# Patient Record
Sex: Female | Born: 1950 | Race: White | Hispanic: No | Smoking: Never smoker
Health system: Southern US, Community
[De-identification: ages and names within clinical notes are randomized; demographics above are authoritative.]

## PROBLEM LIST (undated history)

## (undated) DIAGNOSIS — E785 Hyperlipidemia, unspecified: Secondary | ICD-10-CM

## (undated) DIAGNOSIS — T7840XA Allergy, unspecified, initial encounter: Secondary | ICD-10-CM

## (undated) DIAGNOSIS — I219 Acute myocardial infarction, unspecified: Secondary | ICD-10-CM

## (undated) DIAGNOSIS — F32A Depression, unspecified: Secondary | ICD-10-CM

## (undated) DIAGNOSIS — K644 Residual hemorrhoidal skin tags: Secondary | ICD-10-CM

## (undated) DIAGNOSIS — G4730 Sleep apnea, unspecified: Secondary | ICD-10-CM

## (undated) DIAGNOSIS — K635 Polyp of colon: Secondary | ICD-10-CM

## (undated) DIAGNOSIS — I5041 Acute combined systolic (congestive) and diastolic (congestive) heart failure: Secondary | ICD-10-CM

## (undated) DIAGNOSIS — J45909 Unspecified asthma, uncomplicated: Secondary | ICD-10-CM

## (undated) DIAGNOSIS — G4710 Hypersomnia, unspecified: Secondary | ICD-10-CM

## (undated) DIAGNOSIS — K760 Fatty (change of) liver, not elsewhere classified: Secondary | ICD-10-CM

## (undated) DIAGNOSIS — I639 Cerebral infarction, unspecified: Secondary | ICD-10-CM

## (undated) DIAGNOSIS — K3184 Gastroparesis: Secondary | ICD-10-CM

## (undated) DIAGNOSIS — M810 Age-related osteoporosis without current pathological fracture: Secondary | ICD-10-CM

## (undated) DIAGNOSIS — E119 Type 2 diabetes mellitus without complications: Secondary | ICD-10-CM

## (undated) DIAGNOSIS — I10 Essential (primary) hypertension: Secondary | ICD-10-CM

## (undated) DIAGNOSIS — F329 Major depressive disorder, single episode, unspecified: Secondary | ICD-10-CM

## (undated) DIAGNOSIS — I2119 ST elevation (STEMI) myocardial infarction involving other coronary artery of inferior wall: Secondary | ICD-10-CM

## (undated) DIAGNOSIS — I2510 Atherosclerotic heart disease of native coronary artery without angina pectoris: Secondary | ICD-10-CM

## (undated) DIAGNOSIS — K5790 Diverticulosis of intestine, part unspecified, without perforation or abscess without bleeding: Secondary | ICD-10-CM

## (undated) DIAGNOSIS — E039 Hypothyroidism, unspecified: Secondary | ICD-10-CM

## (undated) DIAGNOSIS — E669 Obesity, unspecified: Secondary | ICD-10-CM

## (undated) DIAGNOSIS — H269 Unspecified cataract: Secondary | ICD-10-CM

## (undated) DIAGNOSIS — N200 Calculus of kidney: Secondary | ICD-10-CM

## (undated) HISTORY — PX: CERVICAL DISC SURGERY: SHX588

## (undated) HISTORY — DX: Unspecified cataract: H26.9

## (undated) HISTORY — DX: Age-related osteoporosis without current pathological fracture: M81.0

## (undated) HISTORY — DX: Unspecified asthma, uncomplicated: J45.909

## (undated) HISTORY — DX: Hyperlipidemia, unspecified: E78.5

## (undated) HISTORY — DX: Allergy, unspecified, initial encounter: T78.40XA

## (undated) HISTORY — DX: Obesity, unspecified: E66.9

## (undated) HISTORY — DX: Hypersomnia, unspecified: G47.10

## (undated) HISTORY — DX: Residual hemorrhoidal skin tags: K64.4

## (undated) HISTORY — DX: Fatty (change of) liver, not elsewhere classified: K76.0

## (undated) HISTORY — DX: Hypothyroidism, unspecified: E03.9

## (undated) HISTORY — DX: Gastroparesis: K31.84

## (undated) HISTORY — DX: Diverticulosis of intestine, part unspecified, without perforation or abscess without bleeding: K57.90

## (undated) HISTORY — DX: Type 2 diabetes mellitus without complications: E11.9

## (undated) HISTORY — DX: Major depressive disorder, single episode, unspecified: F32.9

## (undated) HISTORY — DX: Essential (primary) hypertension: I10

## (undated) HISTORY — DX: Acute myocardial infarction, unspecified: I21.9

## (undated) HISTORY — PX: COLONOSCOPY: SHX174

## (undated) HISTORY — DX: Polyp of colon: K63.5

## (undated) HISTORY — DX: Cerebral infarction, unspecified: I63.9

## (undated) HISTORY — DX: Sleep apnea, unspecified: G47.30

## (undated) HISTORY — PX: ROTATOR CUFF REPAIR: SHX139

## (undated) HISTORY — PX: ULNAR NERVE REPAIR: SHX2594

## (undated) HISTORY — PX: BREAST REDUCTION SURGERY: SHX8

## (undated) HISTORY — DX: Calculus of kidney: N20.0

## (undated) HISTORY — PX: TUBAL LIGATION: SHX77

## (undated) HISTORY — DX: Depression, unspecified: F32.A

---

## 1998-09-08 HISTORY — PX: CARPAL TUNNEL RELEASE: SHX101

## 2004-09-25 ENCOUNTER — Ambulatory Visit (HOSPITAL_COMMUNITY): Admission: RE | Admit: 2004-09-25 | Discharge: 2004-09-25 | Payer: Self-pay | Admitting: Gastroenterology

## 2004-10-02 ENCOUNTER — Encounter: Admission: RE | Admit: 2004-10-02 | Discharge: 2004-10-02 | Payer: Self-pay | Admitting: Gastroenterology

## 2004-10-25 ENCOUNTER — Ambulatory Visit (HOSPITAL_COMMUNITY): Admission: RE | Admit: 2004-10-25 | Discharge: 2004-10-25 | Payer: Self-pay | Admitting: Family Medicine

## 2005-02-22 ENCOUNTER — Encounter: Admission: RE | Admit: 2005-02-22 | Discharge: 2005-02-22 | Payer: Self-pay | Admitting: Neurology

## 2005-11-26 ENCOUNTER — Encounter: Admission: RE | Admit: 2005-11-26 | Discharge: 2005-11-26 | Payer: Self-pay | Admitting: Internal Medicine

## 2006-03-04 ENCOUNTER — Ambulatory Visit: Payer: Self-pay | Admitting: Internal Medicine

## 2006-03-08 DIAGNOSIS — K635 Polyp of colon: Secondary | ICD-10-CM

## 2006-03-08 HISTORY — DX: Polyp of colon: K63.5

## 2006-03-23 ENCOUNTER — Ambulatory Visit: Payer: Self-pay | Admitting: Internal Medicine

## 2008-05-09 ENCOUNTER — Encounter: Admission: RE | Admit: 2008-05-09 | Discharge: 2008-05-09 | Payer: Self-pay | Admitting: Internal Medicine

## 2008-12-06 ENCOUNTER — Encounter: Admission: RE | Admit: 2008-12-06 | Discharge: 2008-12-06 | Payer: Self-pay | Admitting: Internal Medicine

## 2008-12-25 ENCOUNTER — Ambulatory Visit (HOSPITAL_COMMUNITY): Admission: RE | Admit: 2008-12-25 | Discharge: 2008-12-25 | Payer: Self-pay | Admitting: *Deleted

## 2009-10-08 ENCOUNTER — Telehealth: Payer: Self-pay | Admitting: Internal Medicine

## 2009-10-12 ENCOUNTER — Ambulatory Visit: Payer: Self-pay | Admitting: Internal Medicine

## 2009-10-12 ENCOUNTER — Encounter (INDEPENDENT_AMBULATORY_CARE_PROVIDER_SITE_OTHER): Payer: Self-pay | Admitting: *Deleted

## 2009-10-16 ENCOUNTER — Telehealth: Payer: Self-pay | Admitting: Internal Medicine

## 2009-11-29 ENCOUNTER — Ambulatory Visit: Payer: Self-pay | Admitting: Internal Medicine

## 2009-11-29 ENCOUNTER — Encounter (INDEPENDENT_AMBULATORY_CARE_PROVIDER_SITE_OTHER): Payer: Self-pay | Admitting: *Deleted

## 2010-08-30 ENCOUNTER — Emergency Department (HOSPITAL_COMMUNITY)
Admission: EM | Admit: 2010-08-30 | Discharge: 2010-08-30 | Payer: Self-pay | Source: Home / Self Care | Admitting: Family Medicine

## 2010-09-29 ENCOUNTER — Encounter: Payer: Self-pay | Admitting: Family Medicine

## 2010-10-08 NOTE — Procedures (Signed)
Summary: Colonoscopy  Patient: [name] Note: All result statuses are Final unless otherwise noted.  Tests: (1) Colonoscopy (COL)   COL Colonoscopy           [facility]ONE      [facility]     [address]     [place], [place]  [address]           COLONOSCOPY PROCEDURE REPORT           PATIENT:  Danielle Vargas, Danielle Vargas  MR#:  [id number]     BIRTHDATE:  1951-05-24, 58 yrs. old  GENDER:  female     ENDOSCOPIST:  Wilhemina Bonito. Eda Keys, MD     REF. BY:  Kari Baars, M.D.     PROCEDURE DATE:  11/29/2009     PROCEDURE:  Colonoscopy, Diagnostic     ASA CLASS:  Class II     INDICATIONS:  heme positive stool ; reported hx adenomas elsewhere     2004; no polyps here in 2007; cousin w/ colon ca 40's     MEDICATIONS:   Fentanyl 75 mcg IV, Versed 9 mg IV, Benadryl 12.5     mg IV           DESCRIPTION OF PROCEDURE:   After the risks benefits and     alternatives of the procedure were thoroughly explained, informed     consent was obtained.  Digital rectal exam was performed and     revealed no abnormalities.   The LB CF-[id number] endoscope     was introduced through the anus and advanced to the cecum, which     was identified by both the appendix and ileocecal valve, without     limitations. Time to cecum = 4:26 min.The quality of the prep was     excellent, using MoviPrep.  The instrument was then slowly     withdrawn (time = 12:15 min) as the colon was fully examined.     <<PROCEDUREIMAGES>>           FINDINGS:  Mild diverticulosis was found in the left colon.  This     was otherwise a normal examination of the colon.   Retroflexed     views in the rectum revealed internal hemorrhoids.    The scope     was then withdrawn from the patient and the procedure completed.           COMPLICATIONS:  None     ENDOSCOPIC IMPRESSION:     1) Mild diverticulosis in the left colon     2) Otherwise normal examination     3) Small Internal hemorrhoids           RECOMMENDATIONS:     1) Follow up  colonoscopy in 5 years     2) Upper endoscopy will be scheduled to further evaluate Heme +     stool           ______________________________     Wilhemina Bonito. Eda Keys, MD           CC:  Kari Baars, MD; The Patient           n.     eSIGNED:   Wilhemina Bonito. Eda Keys at 11/29/2009 10:16 AM           Leverne Humbles, [id number]  Note: An exclamation mark (!) indicates a result that was not dispersed into the flowsheet. Document Creation Date: 11/29/2009 10:16 AM _______________________________________________________________________  (1) Order result  status: Final Collection or observation date-time: 11/29/2009 09:55 Requested date-time:  Receipt date-time:  Reported date-time:  Referring Physician:   Ordering Physician: Fransico Setters [phone]) Specimen Source:  Source: Launa Grill Order Number: [id number] Lab site:   Appended Document: Colonoscopy    Clinical Lists Changes  Observations: Added new observation of COLONNXTDUE: 11/2014 (11/29/2009 12:40)

## 2010-10-08 NOTE — Letter (Signed)
Summary: Diabetic Instructions   G[facility]enter[facility]  [address] [place], [place] [address]   Phone: [phone]  Fax: [phone]    Danielle Vargas August 13, 1951 MRN: [id number]   _  _   ORAL DIABETIC MEDICATION INSTRUCTIONS  The day before your procedure: On Tuesday, 12/11/09   Take your diabetic pill as you do normally  The day of your procedure: Wednesday, 12/12/09   Do not take your diabetic pill    We will check your blood sugar levels during the admission process and again in Recovery before discharging you home  ________________________________________________________________________

## 2010-10-08 NOTE — Letter (Signed)
Summary: [facility] Instructions  [facility]  [address] [place], [place] [address]   Phone: [phone]  Fax: [phone]       AUBRYN SPINOLA    1951/07/11    MRN: [id number]        Procedure Day /Date:  10/17/09  Wednesday     Arrival Time:  10:30am      Procedure Time: 11:30am     Location of Procedure:                    _x _  [facility] (4th Floor)                        PREPARATION FOR COLONOSCOPY WITH MOVIPREP   Starting 5 days prior to your procedure _2/4/11 _ do not eat nuts, seeds, popcorn, corn, beans, peas,  salads, or any raw vegetables.  Do not take any fiber supplements (e.g. Metamucil, Citrucel, and Benefiber).  THE DAY BEFORE YOUR PROCEDURE         DATE:  10/16/09  DAY: Tuesday  1.  Drink clear liquids the entire day-NO SOLID FOOD  2.  Do not drink anything colored red or purple.  Avoid juices with pulp.  No orange juice.  3.  Drink at least 64 oz. (8 glasses) of fluid/clear liquids during the day to prevent dehydration and help the prep work efficiently.  CLEAR LIQUIDS INCLUDE: Water Jello Ice Popsicles Tea (sugar ok, no milk/cream) Powdered fruit flavored drinks Coffee (sugar ok, no milk/cream) Gatorade Juice: apple, white grape, white cranberry  Lemonade Clear bullion, consomm, broth Carbonated beverages (any kind) Strained chicken noodle soup Hard Candy                             4.  In the morning, mix first dose of MoviPrep solution:    Empty 1 Pouch A and 1 Pouch B into the disposable container    Add lukewarm drinking water to the top line of the container. Mix to dissolve    Refrigerate (mixed solution should be used within 24 hrs)  5.  Begin drinking the prep at 5:00 p.m. The MoviPrep container is divided by 4 marks.   Every 15 minutes drink the solution down to the next mark (approximately 8 oz) until the full liter is complete.   6.  Follow completed prep with 16 oz of clear liquid of your choice  (Nothing red or purple).  Continue to drink clear liquids until bedtime.  7.  Before going to bed, mix second dose of MoviPrep solution:    Empty 1 Pouch A and 1 Pouch B into the disposable container    Add lukewarm drinking water to the top line of the container. Mix to dissolve    Refrigerate  THE DAY OF YOUR PROCEDURE      DATE:  10/17/09  DAY:  Wednesday  Beginning at 6:30am  (5 hours before procedure):         1. Every 15 minutes, drink the solution down to the next mark (approx 8 oz) until the full liter is complete.  2. Follow completed prep with 16 oz. of clear liquid of your choice.    3. You may drink clear liquids until 9:30am   (2 HOURS BEFORE PROCEDURE).   MEDICATION INSTRUCTIONS  Unless otherwise instructed, you should take regular prescription medications with a small sip of water  as early as possible the morning of your procedure.  Diabetic patients - Do not take your diabetic medicines the am of your procedure.  You can take them after you eat.         OTHER INSTRUCTIONS  You will need a responsible adult at least 59 years of age to accompany you and drive you home.   This person must remain in the waiting room during your procedure.  Wear loose fitting clothing that is easily removed.  Leave jewelry and other valuables at home.  However, you may wish to bring a book to read or  an iPod/MP3 player to listen to music as you wait for your procedure to start.  Remove all body piercing jewelry and leave at home.  Total time from sign-in until discharge is approximately 2-3 hours.  You should go home directly after your procedure and rest.  You can resume normal activities the  day after your procedure.  The day of your procedure you should not:   Drive   Make legal decisions   Operate machinery   Drink alcohol   Return to work  You will receive specific instructions about eating, activities and medications before you leave.    The above  instructions have been reviewed and explained to me by   Clide Cliff, RN___    I fully understand and can verbalize these instructions _____________________________ Date _________

## 2010-10-08 NOTE — Letter (Signed)
Summary: EGD Instructions  [facility]  [address] [place], [place] [address]   Phone: [phone]  Fax: [phone]       Danielle Vargas    Aug 16, 1951    MRN: [id number]       Procedure Day /Date:  12/12/2009     Arrival Time: 08:30       Procedure Time:  09:30     Location of Procedure:                    x [facility] (4th Floor)    PREPARATION FOR ENDOSCOPY   On_ Wednesday, April 6th, 2011  THE DAY OF THE PROCEDURE:  1.   No solid foods, milk or milk products are allowed after midnight the night before your procedure.  2.   Do not drink anything colored red or purple.  Avoid juices with pulp.  No orange juice.  3.  You may drink clear liquids until_ 7:30 _, which is 2 hours before your procedure.                                                                                                CLEAR LIQUIDS INCLUDE: Water Jello Ice Popsicles Tea (sugar ok, no milk/cream) Powdered fruit flavored drinks Coffee (sugar ok, no milk/cream) Gatorade Juice: apple, white grape, white cranberry  Lemonade Clear bullion, consomm, broth Carbonated beverages (any kind) Strained chicken noodle soup Hard Candy   MEDICATION INSTRUCTIONS  Unless otherwise instructed, you should take regular prescription medications with a small sip of water as early as possible the morning of your procedure.  Diabetic patients - see separate instructions.  Additional medication instructions:             OTHER INSTRUCTIONS  You will need a responsible adult at least 59 years of age to accompany you and drive you home.   This person must remain in the waiting room during your procedure.  Wear loose fitting clothing that is easily removed.  Leave jewelry and other valuables at home.  However, you may wish to bring a book to read or an iPod/MP3 player to listen to music as you wait for your procedure to start.  Remove all body piercing jewelry and leave at  home.  Total time from sign-in until discharge is approximately 2-3 hours.  You should go home directly after your procedure and rest.  You can resume normal activities the day after your procedure.  The day of your procedure you should not:   Drive   Make legal decisions   Operate machinery   Drink alcohol   Return to work  You will receive specific instructions about eating, activities and medications before you leave.    The above instructions have been reviewed and explained to me by   Weston Brass, RN   I fully understand and can verbalize these instructions _____________________________ Date _________

## 2010-10-08 NOTE — Progress Notes (Signed)
Summary: triage  Phone Note From Other Clinic Call back at [phone]   Caller: Malachi Bonds Call For: Dr. Marina Goodell Reason for Call: Schedule Patient Appt Summary of Call: Dr. Clelia Croft would like pt to have a COL asap for hem positive stools... Dr. Marina Goodell doesnt recall until 2012... next available appt is mid-Feb and Malachi Bonds wants sooner Initial call taken by: Vallarie Mare,  October 08, 2009 4:54 PM  Follow-up for Phone Call          Malachi Bonds will fax records for Dr.Perry's review   Teryl Lucy RN  October 09, 2009 8:38 AM Records reviewed by Dr.perry and pt. may be scheduled for direct colon with diagnosis of 792.1/V12.72/V16.0/250.0 per Dr Marina Goodell. M essage left for gloria to call me back   Follow-up by: Teryl Lucy RN,  October 09, 2009 8:38 AM  Additional Follow-up for Phone Call Additional follow up Details #1::        Records reviewed by Dr.Perry and pt. may be scheduled for a direct colon.Appt. made for 10/17/09 at 11:30 at [facility] and given PV. Pt. and Malachi Bonds aware. Additional Follow-up by: Teryl Lucy RN,  October 10, 2009 11:22 AM

## 2010-10-08 NOTE — Miscellaneous (Signed)
Summary: colon 2/9 hem pos stools/ks  Clinical Lists Changes  Medications: Added new medication of MOVIPREP 100 GM  SOLR (PEG-KCL-NACL-NASULF-NA ASC-C) As directed - Signed Rx of MOVIPREP 100 GM  SOLR (PEG-KCL-NACL-NASULF-NA ASC-C) As directed;  #1 x 0;  Signed;  Entered by: Clide Cliff RN;  Authorized by: Hilarie Fredrickson MD;  Method used: Electronically to [facility] Dr. # [phone]*, [address], [place], [place]  [address], Ph: [phone], Fax: [phone] Observations: Added new observation of NKA: T (10/12/2009 13:48)    Prescriptions: MOVIPREP 100 GM  SOLR (PEG-KCL-NACL-NASULF-NA ASC-C) As directed  #1 x 0   Entered by:   Clide Cliff RN   Authorized by:   Hilarie Fredrickson MD   Signed by:   Clide Cliff RN on 10/12/2009   Method used:   Electronically to        [facility] # [phone]* (retail)       [address]       [place], [place]  [address]       Ph: [phone]       Fax: [phone]   RxID:   [id number]

## 2010-10-08 NOTE — Progress Notes (Signed)
Summary: Rescheduled Colonoscopy  Phone Note Call from Patient Call back at Home Phone [phone]   Caller: Patient Call For: Dr. Marina Goodell Summary of Call: Pt. r/s her colonoscopy from 10-17-09 to 11-29-09 b/c her driver had a conflict with her schedule for tomorrow. Do you want this patient charged the cancelation fee? Initial call taken by: Karna Christmas,  October 16, 2009 8:21 AM  Follow-up for Phone Call        no charge  Additional Follow-up for Phone Call Additional follow up Details #1::        Patient NOT BILLED. Additional Follow-up by: Leanor Kail [facility],  October 18, 2009 10:46 AM

## 2010-11-18 LAB — POCT URINALYSIS DIPSTICK
Ketones, ur: NEGATIVE mg/dL
Protein, ur: 30 mg/dL — AB
Specific Gravity, Urine: >=1.03 (ref 1.005–1.030)
pH: 5.5 (ref 5.0–8.0)

## 2010-12-02 LAB — GLUCOSE, CAPILLARY: Glucose-Capillary: 145 mg/dL — ABNORMAL HIGH (ref 70–99)

## 2010-12-18 LAB — GLUCOSE, CAPILLARY

## 2011-01-21 NOTE — Cardiovascular Report (Signed)
Danielle Vargas, Danielle Vargas              ACCOUNT NO.:  [id number]   MEDICAL RECORD NO.:  [id number]          PATIENT TYPE:  OIB   LOCATION:  2899                         FACILITY:  [facility]   PHYSICIAN:  Elmore Guise., M.D.DATE OF BIRTH:  24-Oct-1950   DATE OF PROCEDURE:  12/25/2008  DATE OF DISCHARGE:                            CARDIAC CATHETERIZATION   INDICATIONS FOR PROCEDURE:  Exertional chest pain, multiple cardiac risk  factors.   HISTORY OF PRESENT ILLNESS:  Ms. Benedick is a very pleasant 60 year old  white female with past medical history of morbid obesity, hypertension,  diabetes mellitus, and dyslipidemia, who presented with increasing chest  discomfort over the last week.  She now reports retrosternal chest pain  occurring more frequently and intensely.  This is associated with  shortness of breath.  She had 1 spell at rest.  She is now referred for  cardiac catheterization.   DESCRIPTION OF PROCEDURE:  The patient was brought to the Cardiac Cath  Lab after appropriate informed consent.  She was prepped and draped in a  sterile fashion.  Approximately 20 mL of 1% lidocaine was used for local  anesthesia.  A 5-French sheath was placed in the right femoral artery  with the assistance of a Smart needle.  Coronary angiography, LV  angiography were then performed.  The patient tolerated the procedure  well and was transferred from Cardiac Cath Lab in stable condition.   FINDINGS:  1. No true left main, separate ostia noted for LAD and circumflex      vessels.  2. LAD:  Small to moderate size vessel with mild luminal      irregularities.  3. D1:  Small vessel with mild luminal irregularities.  4. LCX:  Moderate-sized vessel with mild luminal irregularities.  5. OM-1/OM-2:  Moderate size with mild luminal irregularities.  6. RCA:  Dominant with mild luminal irregularities.  7. PDA/PLV:  Patent with mild luminal irregularities.  8. LV:  EF 75%, hyperdynamic LV systolic  function, no wall motion      abnormalities, LVEDP is 20 mmHg.   IMPRESSION:  1. 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  2. Hyperdynamic left ventricular systolic function, ejection fraction      75%.  No wall motion abnormalities.   PLAN:  At this time, I would recommend aggressive risk factor  modification as indicated.  We will continue her Ranexa for now.  She  will hold her lisinopril for 24 hours and her metformin for 48 hours  following the procedure.  I will plan to see her in the office in 2  weeks.      Elmore Guise., M.D.  Electronically Signed     TWK/MEDQ  D:  12/25/2008  T:  12/26/2008  Job:  [id number]   cc:   Kari Baars, M.D.

## 2011-02-28 ENCOUNTER — Other Ambulatory Visit: Payer: Self-pay | Admitting: Obstetrics and Gynecology

## 2011-02-28 DIAGNOSIS — R928 Other abnormal and inconclusive findings on diagnostic imaging of breast: Secondary | ICD-10-CM

## 2011-03-07 ENCOUNTER — Other Ambulatory Visit: Payer: Self-pay | Admitting: Obstetrics and Gynecology

## 2011-03-07 ENCOUNTER — Ambulatory Visit
Admission: RE | Admit: 2011-03-07 | Discharge: 2011-03-07 | Disposition: A | Discharge status: Home / Self Care | Payer: Medicaid Other | Source: Ambulatory Visit | Attending: Obstetrics and Gynecology | Admitting: Obstetrics and Gynecology

## 2011-03-07 DIAGNOSIS — R928 Other abnormal and inconclusive findings on diagnostic imaging of breast: Secondary | ICD-10-CM

## 2011-03-19 ENCOUNTER — Other Ambulatory Visit (HOSPITAL_COMMUNITY): Payer: Self-pay | Admitting: Internal Medicine

## 2011-03-19 DIAGNOSIS — R140 Abdominal distension (gaseous): Secondary | ICD-10-CM

## 2011-03-31 ENCOUNTER — Ambulatory Visit (HOSPITAL_COMMUNITY)
Admission: RE | Admit: 2011-03-31 | Discharge: 2011-03-31 | Disposition: A | Discharge status: Home / Self Care | Payer: Medicaid Other | Source: Ambulatory Visit | Attending: Internal Medicine | Admitting: Internal Medicine

## 2011-03-31 DIAGNOSIS — K7689 Other specified diseases of liver: Secondary | ICD-10-CM | POA: Insufficient documentation

## 2011-03-31 DIAGNOSIS — R140 Abdominal distension (gaseous): Secondary | ICD-10-CM

## 2011-03-31 DIAGNOSIS — R141 Gas pain: Secondary | ICD-10-CM | POA: Insufficient documentation

## 2011-03-31 DIAGNOSIS — R142 Eructation: Secondary | ICD-10-CM | POA: Insufficient documentation

## 2011-03-31 MED ORDER — TECHNETIUM TC 99M SULFUR COLLOID
2.0000 | Freq: Once | INTRAVENOUS | Status: AC | PRN
  Administered 2011-03-31: 2 via INTRAVENOUS

## 2011-09-23 ENCOUNTER — Other Ambulatory Visit: Payer: Self-pay | Admitting: Obstetrics and Gynecology

## 2011-09-23 DIAGNOSIS — R921 Mammographic calcification found on diagnostic imaging of breast: Secondary | ICD-10-CM

## 2011-10-20 ENCOUNTER — Ambulatory Visit
Admission: RE | Admit: 2011-10-20 | Discharge: 2011-10-20 | Disposition: A | Discharge status: Home / Self Care | Payer: Medicaid Other | Source: Ambulatory Visit | Attending: Obstetrics and Gynecology | Admitting: Obstetrics and Gynecology

## 2011-10-20 DIAGNOSIS — R921 Mammographic calcification found on diagnostic imaging of breast: Secondary | ICD-10-CM

## 2011-12-24 ENCOUNTER — Encounter (INDEPENDENT_AMBULATORY_CARE_PROVIDER_SITE_OTHER): Payer: Medicaid Other | Admitting: Ophthalmology

## 2012-01-01 ENCOUNTER — Encounter (INDEPENDENT_AMBULATORY_CARE_PROVIDER_SITE_OTHER): Payer: Medicaid Other | Admitting: Ophthalmology

## 2012-01-12 ENCOUNTER — Encounter (INDEPENDENT_AMBULATORY_CARE_PROVIDER_SITE_OTHER): Payer: Medicaid Other | Admitting: Ophthalmology

## 2012-01-15 ENCOUNTER — Encounter (INDEPENDENT_AMBULATORY_CARE_PROVIDER_SITE_OTHER): Payer: Medicaid Other | Admitting: Ophthalmology

## 2012-01-15 DIAGNOSIS — H43819 Vitreous degeneration, unspecified eye: Secondary | ICD-10-CM

## 2012-01-15 DIAGNOSIS — E1165 Type 2 diabetes mellitus with hyperglycemia: Secondary | ICD-10-CM

## 2012-01-15 DIAGNOSIS — E11319 Type 2 diabetes mellitus with unspecified diabetic retinopathy without macular edema: Secondary | ICD-10-CM

## 2012-01-15 DIAGNOSIS — H35039 Hypertensive retinopathy, unspecified eye: Secondary | ICD-10-CM

## 2012-01-15 DIAGNOSIS — H2510 Age-related nuclear cataract, unspecified eye: Secondary | ICD-10-CM

## 2012-01-15 DIAGNOSIS — I10 Essential (primary) hypertension: Secondary | ICD-10-CM

## 2012-01-20 ENCOUNTER — Encounter: Payer: Self-pay | Admitting: *Deleted

## 2012-02-08 ENCOUNTER — Emergency Department (HOSPITAL_COMMUNITY)
Admission: EM | Admit: 2012-02-08 | Discharge: 2012-02-08 | Disposition: A | Discharge status: Home / Self Care | Payer: Medicaid Other | Attending: Emergency Medicine | Admitting: Emergency Medicine

## 2012-02-08 ENCOUNTER — Emergency Department (HOSPITAL_COMMUNITY): Payer: Medicaid Other

## 2012-02-08 ENCOUNTER — Encounter (HOSPITAL_COMMUNITY): Payer: Self-pay | Admitting: Emergency Medicine

## 2012-02-08 DIAGNOSIS — I10 Essential (primary) hypertension: Secondary | ICD-10-CM | POA: Insufficient documentation

## 2012-02-08 DIAGNOSIS — N134 Hydroureter: Secondary | ICD-10-CM | POA: Insufficient documentation

## 2012-02-08 DIAGNOSIS — R10819 Abdominal tenderness, unspecified site: Secondary | ICD-10-CM | POA: Insufficient documentation

## 2012-02-08 DIAGNOSIS — N200 Calculus of kidney: Secondary | ICD-10-CM | POA: Insufficient documentation

## 2012-02-08 DIAGNOSIS — E119 Type 2 diabetes mellitus without complications: Secondary | ICD-10-CM | POA: Insufficient documentation

## 2012-02-08 DIAGNOSIS — R109 Unspecified abdominal pain: Secondary | ICD-10-CM | POA: Insufficient documentation

## 2012-02-08 DIAGNOSIS — N390 Urinary tract infection, site not specified: Secondary | ICD-10-CM

## 2012-02-08 LAB — DIFFERENTIAL
Basophils Absolute: 0.1 10*3/uL (ref 0.0–0.1)
Basophils Relative: 1 % (ref 0–1)
Lymphocytes Relative: 43 % (ref 12–46)
Monocytes Absolute: 0.5 10*3/uL (ref 0.1–1.0)
Neutro Abs: 2.6 10*3/uL (ref 1.7–7.7)
Neutrophils Relative %: 43 % (ref 43–77)

## 2012-02-08 LAB — URINALYSIS, ROUTINE W REFLEX MICROSCOPIC
Nitrite: NEGATIVE
Specific Gravity, Urine: 1.011 (ref 1.005–1.030)
Urobilinogen, UA: 0.2 mg/dL (ref 0.0–1.0)
pH: 5.5 (ref 5.0–8.0)

## 2012-02-08 LAB — CBC
HCT: 36.3 % (ref 36.0–46.0)
Hemoglobin: 11.8 g/dL — ABNORMAL LOW (ref 12.0–15.0)
MCHC: 32.5 g/dL (ref 30.0–36.0)
RDW: 15.9 % — ABNORMAL HIGH (ref 11.5–15.5)
WBC: 5.9 10*3/uL (ref 4.0–10.5)

## 2012-02-08 LAB — URINE MICROSCOPIC-ADD ON

## 2012-02-08 MED ORDER — CEFUROXIME AXETIL 500 MG PO TABS: 500.0000 mg | ORAL_TABLET | Freq: Two times a day (BID) | ORAL | Status: AC

## 2012-02-08 MED ORDER — ONDANSETRON HCL 4 MG/2ML IJ SOLN
4.0000 mg | Freq: Once | INTRAMUSCULAR | Status: AC
  Administered 2012-02-08: 4 mg via INTRAVENOUS
  Filled 2012-02-08: qty 2

## 2012-02-08 MED ORDER — HYDROMORPHONE HCL PF 1 MG/ML IJ SOLN
1.0000 mg | Freq: Once | INTRAMUSCULAR | Status: AC
  Administered 2012-02-08: 1 mg via INTRAVENOUS
  Filled 2012-02-08: qty 1

## 2012-02-08 MED ORDER — HYDROCODONE-ACETAMINOPHEN 5-325 MG PO TABS: 1.0000 | ORAL_TABLET | Freq: Four times a day (QID) | ORAL | Status: AC | PRN

## 2012-02-08 MED ORDER — SODIUM CHLORIDE 0.9 % IV SOLN
Freq: Once | INTRAVENOUS | Status: AC
  Administered 2012-02-08: 13:00:00 via INTRAVENOUS

## 2012-02-08 MED ORDER — ONDANSETRON HCL 4 MG PO TABS: 4.0000 mg | ORAL_TABLET | Freq: Four times a day (QID) | ORAL | Status: AC

## 2012-02-08 MED ORDER — DEXTROSE 5 % IV SOLN
1.0000 g | Freq: Once | INTRAVENOUS | Status: AC
  Administered 2012-02-08: 1 g via INTRAVENOUS
  Filled 2012-02-08: qty 10

## 2012-02-08 NOTE — ED Notes (Signed)
Pt c/o left flank pain onset Thursday. Today pt is having nausea and pain with urination.

## 2012-02-08 NOTE — Discharge Instructions (Signed)
Read the information below.  Please call the urologist listed above to schedule a follow up appointment.  If you develop high fevers, worsening flank pain, uncontrolled vomiting, or inability to urinate, return to the ER immediately.  You may return to the ER at any time for worsening condition or any new symptoms that concern you.   Kidney Stones Kidney stones (ureteral lithiasis) are deposits that form inside your kidneys. The intense pain is caused by the stone moving through the urinary tract. When the stone moves, the ureter goes into spasm around the stone. The stone is usually passed in the urine.  CAUSES   A disorder that makes certain neck glands produce too much parathyroid hormone (primary hyperparathyroidism).   A buildup of uric acid crystals.   Narrowing (stricture) of the ureter.   A kidney obstruction present at birth (congenital obstruction).   Previous surgery on the kidney or ureters.   Numerous kidney infections.  SYMPTOMS   Feeling sick to your stomach (nauseous).   Throwing up (vomiting).   Blood in the urine (hematuria).   Pain that usually spreads (radiates) to the groin.   Frequency or urgency of urination.  DIAGNOSIS   Taking a history and physical exam.   Blood or urine tests.   Computerized X-ray scan (CT scan).   Occasionally, an examination of the inside of the urinary bladder (cystoscopy) is performed.  TREATMENT   Observation.   Increasing your fluid intake.   Surgery may be needed if you have severe pain or persistent obstruction.  The size, location, and chemical composition are all important variables that will determine the proper choice of action for you. Talk to your caregiver to better understand your situation so that you will minimize the risk of injury to yourself and your kidney.  HOME CARE INSTRUCTIONS   Drink enough water and fluids to keep your urine clear or pale yellow.   Strain all urine through the provided strainer.  Keep all particulate matter and stones for your caregiver to see. The stone causing the pain may be as small as a grain of salt. It is very important to use the strainer each and every time you pass your urine. The collection of your stone will allow your caregiver to analyze it and verify that a stone has actually passed.   Only take over-the-counter or prescription medicines for pain, discomfort, or fever as directed by your caregiver.   Make a follow-up appointment with your caregiver as directed.   Get follow-up X-rays if required. The absence of pain does not always mean that the stone has passed. It may have only stopped moving. If the urine remains completely obstructed, it can cause loss of kidney function or even complete destruction of the kidney. It is your responsibility to make sure X-rays and follow-ups are completed. Ultrasounds of the kidney can show blockages and the status of the kidney. Ultrasounds are not associated with any radiation and can be performed easily in a matter of minutes.  SEEK IMMEDIATE MEDICAL CARE IF:   Pain cannot be controlled with the prescribed medicine.   You have a fever.   The severity or intensity of pain increases over 18 hours and is not relieved by pain medicine.   You develop a new onset of abdominal pain.   You feel faint or pass out.  MAKE SURE YOU:   Understand these instructions.   Will watch your condition.   Will get help right away if you are not  doing well or get worse.  Document Released: 08/25/2005 Document Revised: 08/14/2011 Document Reviewed: 12/21/2009 [facility] Patient Information 2012 [place], [place].  Urinary Tract Infection Infections of the urinary tract can start in several places. A bladder infection (cystitis), a kidney infection (pyelonephritis), and a prostate infection (prostatitis) are different types of urinary tract infections (UTIs). They usually get better if treated with medicines (antibiotics) that kill  germs. Take all the medicine until it is gone. You or your child may feel better in a few days, but TAKE ALL MEDICINE or the infection may not respond and may become more difficult to treat. HOME CARE INSTRUCTIONS   Drink enough water and fluids to keep the urine clear or pale yellow. Cranberry juice is especially recommended, in addition to large amounts of water.   Avoid caffeine, tea, and carbonated beverages. They tend to irritate the bladder.   Alcohol may irritate the prostate.   Only take over-the-counter or prescription medicines for pain, discomfort, or fever as directed by your caregiver.  To prevent further infections:  Empty the bladder often. Avoid holding urine for long periods of time.   After a bowel movement, women should cleanse from front to back. Use each tissue only once.   Empty the bladder before and after sexual intercourse.  FINDING OUT THE RESULTS OF YOUR TEST Not all test results are available during your visit. If your or your child's test results are not back during the visit, make an appointment with your caregiver to find out the results. Do not assume everything is normal if you have not heard from your caregiver or the medical facility. It is important for you to follow up on all test results. SEEK MEDICAL CARE IF:   There is back pain.   Your baby is older than 3 months with a rectal temperature of 100.5 F (38.1 C) or higher for more than 1 day.   Your or your child's problems (symptoms) are no better in 3 days. Return sooner if you or your child is getting worse.  SEEK IMMEDIATE MEDICAL CARE IF:   There is severe back pain or lower abdominal pain.   You or your child develops chills.   You have a fever.   Your baby is older than 3 months with a rectal temperature of 102 F (38.9 C) or higher.   Your baby is 29 months old or younger with a rectal temperature of 100.4 F (38 C) or higher.   There is nausea or vomiting.   There is continued  burning or discomfort with urination.  MAKE SURE YOU:   Understand these instructions.   Will watch your condition.   Will get help right away if you are not doing well or get worse.  Document Released: 06/04/2005 Document Revised: 08/14/2011 Document Reviewed: 01/07/2007 [facility] Patient Information 2012 [place], [place].

## 2012-02-08 NOTE — ED Provider Notes (Signed)
Medical screening examination/treatment/procedure(s) were conducted as a shared visit with non-physician practitioner(s) and myself.  I personally evaluated the patient during the encounter  Doug Sou, MD 02/08/12 1655

## 2012-02-08 NOTE — ED Provider Notes (Signed)
History     CSN: [id number]  Arrival date & time 02/08/12  4098   First MD Initiated Contact with Patient 02/08/12 [id number]      Chief Complaint  Patient presents with  . Flank Pain  . Nausea  . Urinary Tract Infection    (Consider location/radiation/quality/duration/timing/severity/associated sxs/prior treatment) HPI Complains of left flank pain, nonradiating onset 1 AM today accompanied by nausea pain is sharp moderate to severe treated with hydrocodone with partial relief. Nothing that patient does makes pain better or worse .pain feels like kidney stone she's had in the past. No shortness of breath no fever accompanying symptoms include nausea and dysuria i.e. burning at urethral meatus. Denies nausea at present Past Medical History  Diagnosis Date  . DM (diabetes mellitus)   . Obesity   . Dyslipidemia   . HTN (hypertension)   . Hypothyroidism   . Gastroparesis   . Migraine     Past Surgical History  Procedure Date  . Cesarean section '79  '84  . Tubal ligation '85  . Breast reduction surgery '88  . Cervical disc surgery '98    fusion  . Carpal tunnel release 2000  . Rotator cuff repair '09    Family History  Problem Relation Age of Onset  . COPD    . Stroke    . Coronary artery disease    . Heart failure    . Hypertension    . Heart attack      History  Substance Use Topics  . Smoking status: Never Smoker   . Smokeless tobacco: Not on file  . Alcohol Use: No    OB History    Grav Para Term Preterm Abortions TAB SAB Ect Mult Living                  Review of Systems  Constitutional: Negative.   HENT: Negative.   Respiratory: Negative.   Cardiovascular: Negative.   Gastrointestinal: Positive for nausea.  Genitourinary: Positive for dysuria and flank pain.  Musculoskeletal: Negative.   Skin: Negative.   Neurological: Negative.   Hematological: Negative.   Psychiatric/Behavioral: Negative.   All other systems reviewed and are  negative.    Allergies  Nsaids  Home Medications   Current Outpatient Rx  Name Route Sig Dispense Refill  . ALBUTEROL SULFATE HFA 108 (90 BASE) MCG/ACT IN AERS Inhalation Inhale 2 puffs into the lungs every 6 (six) hours as needed. For shortness of breath.    Marland Kitchen CALCIUM CARBONATE-VITAMIN D 500-200 MG-UNIT PO TABS Oral Take 2 tablets by mouth daily.    Marland Kitchen CARISOPRODOL 350 MG PO TABS Oral Take 350 mg by mouth 4 (four) times daily as needed. For joint pain.    . CO Q 10 100 MG PO CAPS Oral Take 2 tablets by mouth daily.    . FENOFIBRATE 145 MG PO TABS Oral Take 145 mg by mouth daily.    Marland Kitchen GABAPENTIN 300 MG PO CAPS Oral Take 300 mg by mouth 3 (three) times daily.    Marland Kitchen GLIPIZIDE 5 MG PO TABS Oral Take 5 mg by mouth 2 (two) times daily before a meal.    . HYDROCODONE-ACETAMINOPHEN 5-325 MG PO TABS Oral Take 1 tablet by mouth every 6 (six) hours as needed. For joint and back pain.    Marland Kitchen LEVOTHYROXINE SODIUM 75 MCG PO TABS Oral Take 75 mcg by mouth daily.    Marland Kitchen LOSARTAN POTASSIUM 50 MG PO TABS Oral Take 100 mg by mouth daily.    Marland Kitchen  METOCLOPRAMIDE HCL 10 MG PO TABS Oral Take 10 mg by mouth 2 (two) times daily.     Marland Kitchen MONTELUKAST SODIUM 10 MG PO TABS Oral Take 10 mg by mouth at bedtime.    . OMEPRAZOLE 20 MG PO CPDR Oral Take 20 mg by mouth daily.    Marland Kitchen PIOGLITAZONE HCL-METFORMIN HCL 15-850 MG PO TABS Oral Take 1 tablet by mouth 2 (two) times daily with a meal.    . PIOGLITAZONE HCL-METFORMIN HCL 15-850 MG PO TABS Oral Take 1 tablet by mouth 2 (two) times daily with a meal.    . TOPIRAMATE 50 MG PO TABS Oral Take 50 mg by mouth 2 (two) times daily.    Marland Kitchen VITAMIN D (ERGOCALCIFEROL) 50000 UNITS PO CAPS Oral Take 50,000 Units by mouth every 7 (seven) days. Take on Mondays    . ZOLPIDEM TARTRATE 10 MG PO TABS Oral Take 10 mg by mouth at bedtime as needed. For sleep.    Marland Kitchen NITROGLYCERIN 0.4 MG SL SUBL Sublingual Place 0.4 mg under the tongue every 5 (five) minutes as needed. For chest pain.      BP 153/66   Pulse 94  Temp(Src) 97.9 F (36.6 C) (Oral)  Resp 18  SpO2 98%  Physical Exam  Nursing note and vitals reviewed. Constitutional: She appears well-developed and well-nourished. No distress.  HENT:  Head: Normocephalic and atraumatic.  Eyes: Conjunctivae are normal. Pupils are equal, round, and reactive to light.  Neck: Neck supple. No tracheal deviation present. No thyromegaly present.  Cardiovascular: Normal rate and regular rhythm.   No murmur heard. Pulmonary/Chest: Effort normal and breath sounds normal.  Abdominal: Soft. Bowel sounds are normal. She exhibits no distension. There is no tenderness.       Obese  Genitourinary:       Mild left flank tenderness  Musculoskeletal: Normal range of motion. She exhibits no edema and no tenderness.  Neurological: She is alert. Coordination normal.  Skin: Skin is warm and dry. No rash noted.  Psychiatric: She has a normal mood and affect.    ED Course  Procedures (including critical care time)  Labs Reviewed  URINALYSIS, ROUTINE W REFLEX MICROSCOPIC - Abnormal; Notable for the following:    Leukocytes, UA TRACE (*)    All other components within normal limits  URINE MICROSCOPIC-ADD ON - Abnormal; Notable for the following:    Squamous Epithelial / LPF FEW (*)    All other components within normal limits   No results found.   No diagnosis found.    MDM  CT scan ordered to rule out ureteral stone Plan urine culture ; pain control antibiotics        Doug Sou, MD 02/08/12 1658

## 2012-02-08 NOTE — ED Notes (Signed)
Pt states pain began around 0100 this morning. Hx of kidney stones 18 mos ago. States these sx are the same as the sx with previous kidney stone. Pt took 2 hydrocodone 5/325 around 0200 and pain was 4/10 after she took the pills.  Has not taken any other medication since that time. Pt states she has "problems with my back and left Sacroilliac joint" and this is what the hydrocodone was prescribed for.

## 2012-02-08 NOTE — ED Provider Notes (Signed)
1:20 PM Pt is in CDU holding for CT abd/pelvis, rocephin IV.  Sign out received from Dr Ethelda Chick.  Plan per our discussion is to treat as UTI, and possible ureteral stone.  Discussed all results with patient.  Pt reports continued soreness of the left flank and nausea with narcotic pain medications.  Pt is allergic to NSAIDs.  Per CT reading, it appears that patient has passed left ureteral stone.  Has two right kidney stones.  Pt has never seen a urologist.  Urine sent for culture.  Will d/c home with norco and zofran, ceftin, and urology follow up.  Patient verbalizes understanding and agrees with plan.     Results for orders placed during the hospital encounter of 02/08/12  URINALYSIS, ROUTINE W REFLEX MICROSCOPIC      Component Value Range   Color, Urine YELLOW  YELLOW    APPearance CLEAR  CLEAR    Specific Gravity, Urine 1.011  1.005 - 1.030    pH 5.5  5.0 - 8.0    Glucose, UA NEGATIVE  NEGATIVE (mg/dL)   Hgb urine dipstick NEGATIVE  NEGATIVE    Bilirubin Urine NEGATIVE  NEGATIVE    Ketones, ur NEGATIVE  NEGATIVE (mg/dL)   Protein, ur NEGATIVE  NEGATIVE (mg/dL)   Urobilinogen, UA 0.2  0.0 - 1.0 (mg/dL)   Nitrite NEGATIVE  NEGATIVE    Leukocytes, UA TRACE (*) NEGATIVE   URINE MICROSCOPIC-ADD ON      Component Value Range   Squamous Epithelial / LPF FEW (*) RARE    WBC, UA 3-6  <3 (WBC/hpf)   Bacteria, UA RARE  RARE   CBC      Component Value Range   WBC 5.9  4.0 - 10.5 (K/uL)   RBC 4.11  3.87 - 5.11 (MIL/uL)   Hemoglobin 11.8 (*) 12.0 - 15.0 (g/dL)   HCT 46.9  62.9 - 52.8 (%)   MCV 88.3  78.0 - 100.0 (fL)   MCH 28.7  26.0 - 34.0 (pg)   MCHC 32.5  30.0 - 36.0 (g/dL)   RDW 41.3 (*) 24.4 - 15.5 (%)   Platelets 285  150 - 400 (K/uL)  DIFFERENTIAL      Component Value Range   Neutrophils Relative 43  43 - 77 (%)   Neutro Abs 2.6  1.7 - 7.7 (K/uL)   Lymphocytes Relative 43  12 - 46 (%)   Lymphs Abs 2.6  0.7 - 4.0 (K/uL)   Monocytes Relative 9  3 - 12 (%)   Monocytes Absolute  0.5  0.1 - 1.0 (K/uL)   Eosinophils Relative 4  0 - 5 (%)   Eosinophils Absolute 0.2  0.0 - 0.7 (K/uL)   Basophils Relative 1  0 - 1 (%)   Basophils Absolute 0.1  0.0 - 0.1 (K/uL)   Ct Abdomen Pelvis Wo Contrast  02/08/2012  *RADIOLOGY REPORT*  Clinical Data: Left flank pain and nausea  CT ABDOMEN AND PELVIS WITHOUT CONTRAST  Technique:  Multidetector CT imaging of the abdomen and pelvis was performed following the standard protocol without intravenous contrast.  Comparison: None.  Findings:  The lung bases are clear.  No pericardial or pleural effusion.  There are no focal liver abnormalities identified.  The gallbladder appears normal.  There is no biliary dilatation.  The pancreas appears normal.  Normal appearance of the spleen.  Both adrenal glands are normal.  There is a small 2-3 mm stone within the upper pole of the right kidney and 8-3 mm  stone within the inferior pole of the right kidney. No right-sided hydronephrosis.  There is left-sided pelvocaliectasis and left-sided hydroureter.  No stones are identified.  The urinary bladder appears normal.  Uterus and the adnexal structures appear normal.  The stomach and the small bowel loops have a normal course and caliber without evidence for obstruction.  The appendix is identified and appears within normal limits.  Sigmoid diverticula noted without acute inflammation.  No free fluid or fluid collections within the abdomen or pelvis.  The patient has a periumbilical hernia which contains fat only.  Review of the visualized osseous structures is significant for mild degenerative disc disease.  IMPRESSION:  1.  Nonobstructing right renal calculi measure between two and 3 mm. 2.  Left-sided pelvocaliectasis and hydroureter.  No stones visualized.  Findings may reflect recently passed calculus.  Original Report Authenticated By: Rosealee Albee, M.D.       Rise Patience, [place] 02/08/12 1452

## 2012-02-10 LAB — URINE CULTURE

## 2012-03-31 ENCOUNTER — Encounter: Payer: Self-pay | Admitting: Internal Medicine

## 2012-04-28 ENCOUNTER — Ambulatory Visit: Payer: Medicaid Other | Admitting: Internal Medicine

## 2012-05-21 ENCOUNTER — Encounter: Payer: Self-pay | Admitting: Internal Medicine

## 2012-05-21 ENCOUNTER — Ambulatory Visit (INDEPENDENT_AMBULATORY_CARE_PROVIDER_SITE_OTHER): Payer: Medicaid Other | Admitting: Internal Medicine

## 2012-05-21 VITALS — BP 110/60 | HR 64 | Ht 60.5 in | Wt 211.8 lb

## 2012-05-21 DIAGNOSIS — K219 Gastro-esophageal reflux disease without esophagitis: Secondary | ICD-10-CM

## 2012-05-21 DIAGNOSIS — R1013 Epigastric pain: Secondary | ICD-10-CM

## 2012-05-21 DIAGNOSIS — K760 Fatty (change of) liver, not elsewhere classified: Secondary | ICD-10-CM

## 2012-05-21 DIAGNOSIS — K3184 Gastroparesis: Secondary | ICD-10-CM

## 2012-05-21 DIAGNOSIS — R1011 Right upper quadrant pain: Secondary | ICD-10-CM

## 2012-05-21 DIAGNOSIS — K7689 Other specified diseases of liver: Secondary | ICD-10-CM

## 2012-05-21 DIAGNOSIS — K5900 Constipation, unspecified: Secondary | ICD-10-CM

## 2012-05-21 NOTE — Progress Notes (Signed)
HISTORY OF PRESENT ILLNESS:  Danielle Vargas is a 61 y.o. female with multiple medical problems including morbid obesity, type 2 diabetes mellitus, dyslipidemia, hypertension, hypothyroidism, gastroparesis, migraine headaches, chronic back pain, fatty liver, depression, adenomatous colon polyps, nephrolithiasis. I have seen the patient previously for colon cancer surveillance. She was last seen March 2011 when she underwent colonoscopy to evaluate Hemoccult-positive stool. In addition to a personal history of adenomas, there is a family history of colon cancer in her cousin at age 73. In any event, the colonoscopy revealed mild diverticulosis in the left colon and small internal hemorrhoids. No polyps or explanation for Hemoccult-positive stool. Followup colonoscopy in 5 years recommended. She was to have upper endoscopy performed. This was scheduled. However she canceled due to insurance reasons. She has not been seen since. She is sent now regarding problems with chronic abdominal pain. She states that she has had "stomach pain" about 6 months. She points to the epigastric region. She states it is a constant dull aching discomfort which is generally worse after meals. Some nausea but no vomiting. She was in the emergency room this summer with right-sided pain felt possibly due to kidney stones. She takes chronic pain medications. She states she has early satiety. Despite these symptoms she continues to gain weight. She does report a tendency toward constipation for which she takes MiraLax. Despite this, she moves her bowels about once every other day.. She does have a history of GERD. However, no symptoms on Prilosec 20 mg twice a day. She increased her PPI 6 months ago with the development of a, discomfort. She feels that symptoms may be related to her Actoplus, though she has not interrupted this therapy. She denies fevers, melena, or hematochezia. Review of outside laboratories from July 2013 reveal normal  comprehensive metabolic panel. Normal TSH. Hemoglobin in June was 11.8 with normal MCV. Review of previous radiology include CT scan, without contrast, June 2013. Nonobstructing right renal calculi noted on ultrasound from July 2012 reveals changes of fatty liver. Gallbladder was normal gastric emptying scan at that time revealed mild gastroparesis with 55% of activity remaining 2 hours. The patient tells me that she is possibly having gallbladder attacks as well. She has intermittent right upper quadrant pain. A strong family history of gallbladder problems in her family.  REVIEW OF SYSTEMS:  All non-GI ROS negative except for back pain and shortness of breath  Past Medical History  Diagnosis Date  . DM (diabetes mellitus)   . Obesity   . Dyslipidemia   . HTN (hypertension)   . Hypothyroidism   . Gastroparesis   . Migraine   . Osteoporosis   . Colon polyps 7/07    hyperplastic and adenomatous  . Depression   . Nephrolithiasis   . Diverticulosis   . External hemorrhoid   . Fatty liver     Past Surgical History  Procedure Date  . Cesarean section '79  '84  . Tubal ligation '85  . Breast reduction surgery '88  . Cervical disc surgery '98    fusion  . Carpal tunnel release 2000    right wrist  . Rotator cuff repair '09    left shoulder  . Ulnar nerve repair '02, '08    left done, then right    Social History Rada Maaske  reports that she has never smoked. She has never used smokeless tobacco. She reports that she does not drink alcohol or use illicit drugs.  family history includes COPD in her father; Cirrhosis  in her sister; Colon cancer in an unspecified family member; Diabetes in her brother and mother; Emphysema in her father; Heart attack in her mother; Heart failure in her brother and mother; Hypertension in her brother; Kidney disease in her mother; Lung cancer in her brother; Other in an unspecified family member; and Stroke in her father.  Allergies  Allergen  Reactions  . Nsaids Other (See Comments)    GI problems       PHYSICAL EXAMINATION: Vital signs: BP 110/60  Pulse 64  Ht 5' 0.5" (1.537 m)  Wt 211 lb 12.8 oz (96.072 kg)  BMI 40.68 kg/m2  Constitutional: quite obese and unhealthy-appearing, no acute distress Psychiatric: alert and oriented x3, cooperative Eyes: extraocular movements intact, anicteric, conjunctiva pink Mouth: oral pharynx moist, no lesions Neck:thick, supple no lymphadenopathy Cardiovascular: heart regular rate and rhythm, no murmur Lungs: clear to auscultation bilaterally Abdomen: soft,obese, nontender, nondistended, no obvious ascites, no peritoneal signs, normal bowel sounds, no organomegaly Rectal:omitted Extremities: no lower extremity edema bilaterally Skin: no lesions on visible extremities Neuro: No focal deficits. No asterixis.     ASSESSMENT:  #1. Chronic persistent epigastric discomfort exacerbated by meals. Etiology is unclear. Certainly may be related to known gastroparesis. May also be discomfort related to her tremendous obesity. #2. Intermittent right upper quadrant discomfort. Could be occult gallbladder disease, though negative ultrasound last year. Alternatively, may be capsular distention of the liver with hepatic steatosis #3. GERD. Seemingly under good control with twice a day PPI #4. Functional constipation exacerbated by narcotics #5. History of adenomatous colon polyps. Last colonoscopy March 2013  #6. MULTIPLE significant medical problems   PLAN:  #1. Continue PPI #2. Reflux precautions #3. HIDA scan to rule out gallbladder dysfunction #4. Increase MiraLax to achieve bowel movements once or twice daily #5. Diagnostic upper endoscopy to evaluate persisting pain despite PPI. The patient is high-risk given her comorbidities and body habitus.The nature of the procedure, as well as the risks, benefits, and alternatives were carefully and thoroughly reviewed with the patient. Ample time  for discussion and questions allowed. The patient understood, was satisfied, and agreed to proceed.  #6. Hold diabetic medications the day of the procedure, until normal oral intake resumed. Monitor blood sugar immediately before and after the procedure. #7. Routine surveillance colonoscopy 2016 #8. I discussed with her the importance of weight loss. She needs to get involved with some reasonable exercise program as well as proper diet. With fatty liver, she is at risk for [facility] related cirrhosis. Her sister had cirrhosis. Currently no evidence for abnormal liver tests, liver dysfunction, or portal hypertension.

## 2012-05-21 NOTE — Patient Instructions (Addendum)
You have been scheduled for an endoscopy with propofol. Please follow written instructions given to you at your visit today. If you use inhalers (even only as needed), please bring them with you on the day of your procedure.   You have been scheduled for a HIDA scan at [facility] (1st floor) on 06-02-12. Please arrive 15 minutes prior to your scheduled appointment of 1:61WR. Make certain not to have anything to eat or drink after midnight prior to your test.  Do not take any stomach medications for 24 hours prior to the test.  Do not take your diabetic medication that morning.  You may bring it with you and take it after the test. Should this appointment date or time not work well for you, please call radiology scheduling at [phone].   You may increase your Miralax until you are having 1 to 2 bowel movements a day  We suggest that you research some strategies for exercise and weight loss

## 2012-06-02 ENCOUNTER — Other Ambulatory Visit (HOSPITAL_COMMUNITY): Payer: Medicaid Other

## 2012-06-14 ENCOUNTER — Encounter (HOSPITAL_COMMUNITY)
Admission: RE | Admit: 2012-06-14 | Discharge: 2012-06-14 | Disposition: A | Discharge status: Home / Self Care | Payer: Medicare Other | Source: Ambulatory Visit | Attending: Internal Medicine | Admitting: Internal Medicine

## 2012-06-14 DIAGNOSIS — K3184 Gastroparesis: Secondary | ICD-10-CM | POA: Insufficient documentation

## 2012-06-14 DIAGNOSIS — K5900 Constipation, unspecified: Secondary | ICD-10-CM | POA: Insufficient documentation

## 2012-06-14 DIAGNOSIS — R109 Unspecified abdominal pain: Secondary | ICD-10-CM | POA: Insufficient documentation

## 2012-06-14 DIAGNOSIS — R1013 Epigastric pain: Secondary | ICD-10-CM

## 2012-06-14 DIAGNOSIS — K760 Fatty (change of) liver, not elsewhere classified: Secondary | ICD-10-CM

## 2012-06-14 DIAGNOSIS — R1011 Right upper quadrant pain: Secondary | ICD-10-CM

## 2012-06-14 MED ORDER — TECHNETIUM TC 99M MEBROFENIN IV KIT
4.9000 | PACK | Freq: Once | INTRAVENOUS | Status: AC | PRN
  Administered 2012-06-14: 5 via INTRAVENOUS

## 2012-06-17 ENCOUNTER — Encounter: Payer: Self-pay | Admitting: Internal Medicine

## 2012-06-17 ENCOUNTER — Ambulatory Visit (AMBULATORY_SURGERY_CENTER): Payer: Medicare Other | Admitting: Internal Medicine

## 2012-06-17 VITALS — BP 134/89 | HR 81 | Temp 98.1°F | Resp 15 | Ht 60.5 in | Wt 211.0 lb

## 2012-06-17 DIAGNOSIS — R1013 Epigastric pain: Secondary | ICD-10-CM

## 2012-06-17 DIAGNOSIS — K3184 Gastroparesis: Secondary | ICD-10-CM

## 2012-06-17 DIAGNOSIS — K219 Gastro-esophageal reflux disease without esophagitis: Secondary | ICD-10-CM

## 2012-06-17 DIAGNOSIS — R1011 Right upper quadrant pain: Secondary | ICD-10-CM

## 2012-06-17 LAB — GLUCOSE, CAPILLARY: Glucose-Capillary: 119 mg/dL — ABNORMAL HIGH (ref 70–99)

## 2012-06-17 MED ORDER — SODIUM CHLORIDE 0.9 % IV SOLN: 500.0000 mL | INTRAVENOUS | Status: DC

## 2012-06-17 NOTE — Progress Notes (Signed)
Patient did not experience any of the following events: a burn prior to discharge; a fall within the facility; wrong site/side/patient/procedure/implant event; or a hospital transfer or hospital admission upon discharge from the facility. (G8907) Patient did not have preoperative order for IV antibiotic SSI prophylaxis. (G8918)  

## 2012-06-17 NOTE — Patient Instructions (Addendum)
Findings:  Normal Recommendations:  Continue reflux medication  YOU HAD AN ENDOSCOPIC PROCEDURE TODAY AT THE [facility]: Refer to the procedure report that was given to you for any specific questions about what was found during the examination.  If the procedure report does not answer your questions, please call your gastroenterologist to clarify.  If you requested that your care partner not be given the details of your procedure findings, then the procedure report has been included in a sealed envelope for you to review at your convenience later.  YOU SHOULD EXPECT: Some feelings of bloating in the abdomen. Passage of more gas than usual.  Walking can help get rid of the air that was put into your GI tract during the procedure and reduce the bloating. If you had a lower endoscopy (such as a colonoscopy or flexible sigmoidoscopy) you may notice spotting of blood in your stool or on the toilet paper. If you underwent a bowel prep for your procedure, then you may not have a normal bowel movement for a few days.  DIET: Your first meal following the procedure should be a light meal and then it is ok to progress to your normal diet.  A half-sandwich or bowl of soup is an example of a good first meal.  Heavy or fried foods are harder to digest and may make you feel nauseous or bloated.  Likewise meals heavy in dairy and vegetables can cause extra gas to form and this can also increase the bloating.  Drink plenty of fluids but you should avoid alcoholic beverages for 24 hours.  ACTIVITY: Your care partner should take you home directly after the procedure.  You should plan to take it easy, moving slowly for the rest of the day.  You can resume normal activity the day after the procedure however you should NOT DRIVE or use heavy machinery for 24 hours (because of the sedation medicines used during the test).    SYMPTOMS TO REPORT IMMEDIATELY: A gastroenterologist can be reached at any hour.  During  normal business hours, 8:30 AM to 5:00 PM Monday through Friday, call [phone].  After hours and on weekends, please call the GI answering service at [phone] who will take a message and have the physician on call contact you.   Following lower endoscopy (colonoscopy or flexible sigmoidoscopy):  Excessive amounts of blood in the stool  Significant tenderness or worsening of abdominal pains  Swelling of the abdomen that is new, acute  Fever of 100F or higher  Following upper endoscopy (EGD)  Vomiting of blood or coffee ground material  New chest pain or pain under the shoulder blades  Painful or persistently difficult swallowing  New shortness of breath  Fever of 100F or higher  Black, tarry-looking stools  FOLLOW UP: If any biopsies were taken you will be contacted by phone or by letter within the next 1-3 weeks.  Call your gastroenterologist if you have not heard about the biopsies in 3 weeks.  Our staff will call the home number listed on your records the next business day following your procedure to check on you and address any questions or concerns that you may have at that time regarding the information given to you following your procedure. This is a courtesy call and so if there is no answer at the home number and we have not heard from you through the emergency physician on call, we will assume that you have returned to your regular daily  activities without incident.  SIGNATURES/CONFIDENTIALITY: You and/or your care partner have signed paperwork which will be entered into your electronic medical record.  These signatures attest to the fact that that the information above on your After Visit Summary has been reviewed and is understood.  Full responsibility of the confidentiality of this discharge information lies with you and/or your care-partner.   Please follow all discharge instructions given to you by the recovery room nurse. If you have any questions or problems  after discharge please call one of the numbers listed above. You will receive a phone call in the am to see how you are doing and answer any questions you may have. Thank you for choosing [facility] for your health care needs.

## 2012-06-17 NOTE — Op Note (Signed)
[facility] [address] [place], [address]   ENDOSCOPY PROCEDURE REPORT  PATIENT: Danielle Vargas, Danielle Vargas  MR#: [id number] BIRTHDATE: July 29, 1951 , 60  yrs. old GENDER: Female ENDOSCOPIST: Roxy Cedar, MD REFERRED BY:  .  Self / Office PROCEDURE DATE:  06/17/2012 PROCEDURE:  EGD, diagnostic ASA CLASS:     Class III INDICATIONS:  epigastric pain.  Actually feeling better since OV MEDICATIONS: MAC sedation, administered by CRNA and propofol (Diprivan) 150mg  IV TOPICAL ANESTHETIC: Cetacaine Spray  DESCRIPTION OF PROCEDURE: After the risks benefits and alternatives of the procedure were thoroughly explained, informed consent was obtained.  The LB [id number] endoscope was introduced through the mouth and advanced to the second portion of the duodenum. Without limitations.  The instrument was slowly withdrawn as the mucosa was fully examined.    The upper, middle and distal third of the esophagus were carefully inspected and no abnormalities were noted.  The z-line was well seen at the GEJ.  The endoscope was pushed into the fundus which was normal including a retroflexed view.  The antrum, gastric body, first and second part of the duodenum were unremarkable. Retroflexed views revealed no abnormalities.     The scope was then withdrawn from the patient and the procedure completed.  COMPLICATIONS: There were no complications.  ENDOSCOPIC IMPRESSION: 1. Normal EGD  RECOMMENDATIONS: 1.  continue PPI 2.  OP follow-up is advised on a PRN basis.  e]  eSigned:  Roxy Cedar, MD 06/17/2012 1:59 PM   CC:W.  Buren Kos, MD and The Patient

## 2012-06-18 ENCOUNTER — Telehealth: Payer: Self-pay | Admitting: *Deleted

## 2012-06-18 NOTE — Telephone Encounter (Signed)
  Follow up Call-  Call back number 06/17/2012  Post procedure Call Back phone  # [phone]  Permission to leave phone message Yes     Patient questions:  Do you have a fever, pain , or abdominal swelling? no Pain Score  0 *  Have you tolerated food without any problems? yes  Have you been able to return to your normal activities? yes  Do you have any questions about your discharge instructions: Diet   no Medications  no Follow up visit  no  Do you have questions or concerns about your Care? no  Actions: * If pain score is 4 or above: No action needed, pain <4.

## 2012-07-19 ENCOUNTER — Encounter (INDEPENDENT_AMBULATORY_CARE_PROVIDER_SITE_OTHER): Payer: Medicare Other | Admitting: Ophthalmology

## 2012-07-19 DIAGNOSIS — E1139 Type 2 diabetes mellitus with other diabetic ophthalmic complication: Secondary | ICD-10-CM

## 2012-07-19 DIAGNOSIS — I10 Essential (primary) hypertension: Secondary | ICD-10-CM

## 2012-07-19 DIAGNOSIS — E11319 Type 2 diabetes mellitus with unspecified diabetic retinopathy without macular edema: Secondary | ICD-10-CM

## 2012-07-19 DIAGNOSIS — H2510 Age-related nuclear cataract, unspecified eye: Secondary | ICD-10-CM

## 2012-07-19 DIAGNOSIS — H43819 Vitreous degeneration, unspecified eye: Secondary | ICD-10-CM

## 2012-07-19 DIAGNOSIS — H35039 Hypertensive retinopathy, unspecified eye: Secondary | ICD-10-CM

## 2012-12-09 ENCOUNTER — Ambulatory Visit: Payer: Medicare Other | Admitting: *Deleted

## 2013-01-03 ENCOUNTER — Other Ambulatory Visit: Payer: Self-pay | Admitting: Obstetrics and Gynecology

## 2013-01-03 DIAGNOSIS — R921 Mammographic calcification found on diagnostic imaging of breast: Secondary | ICD-10-CM

## 2013-01-04 ENCOUNTER — Ambulatory Visit: Payer: Medicare Other | Admitting: *Deleted

## 2013-01-13 ENCOUNTER — Ambulatory Visit
Admission: RE | Admit: 2013-01-13 | Discharge: 2013-01-13 | Disposition: A | Discharge status: Home / Self Care | Payer: Medicare Other | Source: Ambulatory Visit | Attending: Obstetrics and Gynecology | Admitting: Obstetrics and Gynecology

## 2013-01-13 DIAGNOSIS — R921 Mammographic calcification found on diagnostic imaging of breast: Secondary | ICD-10-CM

## 2013-01-17 ENCOUNTER — Ambulatory Visit (INDEPENDENT_AMBULATORY_CARE_PROVIDER_SITE_OTHER): Payer: Medicare Other | Admitting: Ophthalmology

## 2013-01-17 DIAGNOSIS — H43819 Vitreous degeneration, unspecified eye: Secondary | ICD-10-CM

## 2013-01-17 DIAGNOSIS — H2510 Age-related nuclear cataract, unspecified eye: Secondary | ICD-10-CM

## 2013-01-17 DIAGNOSIS — E11319 Type 2 diabetes mellitus with unspecified diabetic retinopathy without macular edema: Secondary | ICD-10-CM

## 2013-01-17 DIAGNOSIS — I10 Essential (primary) hypertension: Secondary | ICD-10-CM

## 2013-01-17 DIAGNOSIS — H35039 Hypertensive retinopathy, unspecified eye: Secondary | ICD-10-CM

## 2013-01-17 DIAGNOSIS — E1139 Type 2 diabetes mellitus with other diabetic ophthalmic complication: Secondary | ICD-10-CM

## 2013-05-10 ENCOUNTER — Encounter (HOSPITAL_COMMUNITY): Payer: Self-pay | Admitting: Emergency Medicine

## 2013-05-10 ENCOUNTER — Emergency Department (HOSPITAL_COMMUNITY): Payer: Medicare Other

## 2013-05-10 ENCOUNTER — Emergency Department (HOSPITAL_COMMUNITY)
Admission: EM | Admit: 2013-05-10 | Discharge: 2013-05-10 | Disposition: A | Discharge status: Home / Self Care | Payer: Medicare Other | Attending: Emergency Medicine | Admitting: Emergency Medicine

## 2013-05-10 DIAGNOSIS — Z87442 Personal history of urinary calculi: Secondary | ICD-10-CM | POA: Insufficient documentation

## 2013-05-10 DIAGNOSIS — G43909 Migraine, unspecified, not intractable, without status migrainosus: Secondary | ICD-10-CM | POA: Insufficient documentation

## 2013-05-10 DIAGNOSIS — J40 Bronchitis, not specified as acute or chronic: Secondary | ICD-10-CM

## 2013-05-10 DIAGNOSIS — Z79899 Other long term (current) drug therapy: Secondary | ICD-10-CM | POA: Insufficient documentation

## 2013-05-10 DIAGNOSIS — E669 Obesity, unspecified: Secondary | ICD-10-CM | POA: Insufficient documentation

## 2013-05-10 DIAGNOSIS — M810 Age-related osteoporosis without current pathological fracture: Secondary | ICD-10-CM | POA: Insufficient documentation

## 2013-05-10 DIAGNOSIS — Z8679 Personal history of other diseases of the circulatory system: Secondary | ICD-10-CM | POA: Insufficient documentation

## 2013-05-10 DIAGNOSIS — E876 Hypokalemia: Secondary | ICD-10-CM | POA: Insufficient documentation

## 2013-05-10 DIAGNOSIS — E119 Type 2 diabetes mellitus without complications: Secondary | ICD-10-CM | POA: Insufficient documentation

## 2013-05-10 DIAGNOSIS — Z860100 Personal history of colon polyps, unspecified: Secondary | ICD-10-CM | POA: Insufficient documentation

## 2013-05-10 DIAGNOSIS — I10 Essential (primary) hypertension: Secondary | ICD-10-CM | POA: Insufficient documentation

## 2013-05-10 DIAGNOSIS — Z8659 Personal history of other mental and behavioral disorders: Secondary | ICD-10-CM | POA: Insufficient documentation

## 2013-05-10 DIAGNOSIS — E039 Hypothyroidism, unspecified: Secondary | ICD-10-CM | POA: Insufficient documentation

## 2013-05-10 DIAGNOSIS — E785 Hyperlipidemia, unspecified: Secondary | ICD-10-CM | POA: Insufficient documentation

## 2013-05-10 DIAGNOSIS — J209 Acute bronchitis, unspecified: Secondary | ICD-10-CM | POA: Insufficient documentation

## 2013-05-10 DIAGNOSIS — E871 Hypo-osmolality and hyponatremia: Secondary | ICD-10-CM

## 2013-05-10 LAB — BASIC METABOLIC PANEL
BUN: 11 mg/dL (ref 6–23)
CO2: 18 mEq/L — ABNORMAL LOW (ref 19–32)
Calcium: 9.3 mg/dL (ref 8.4–10.5)
Creatinine, Ser: 0.75 mg/dL (ref 0.50–1.10)
GFR calc non Af Amer: >90 mL/min (ref >90)
Glucose, Bld: 178 mg/dL — ABNORMAL HIGH (ref 70–99)

## 2013-05-10 LAB — POCT I-STAT 3, ART BLOOD GAS (G3+)
Acid-base deficit: 6 mmol/L — ABNORMAL HIGH (ref 0.0–2.0)
Bicarbonate: 18.1 mEq/L — ABNORMAL LOW (ref 20.0–24.0)
Patient temperature: 97.7
TCO2: 19 mmol/L (ref 0–100)
pO2, Arterial: 103 mmHg — ABNORMAL HIGH (ref 80.0–100.0)

## 2013-05-10 LAB — CBC WITH DIFFERENTIAL/PLATELET
Eosinophils Absolute: 0.4 10*3/uL (ref 0.0–0.7)
Eosinophils Relative: 4 % (ref 0–5)
MCHC: 33.6 g/dL (ref 30.0–36.0)
Monocytes Absolute: 0.7 10*3/uL (ref 0.1–1.0)
Neutro Abs: 5.5 10*3/uL (ref 1.7–7.7)
Neutrophils Relative %: 62 % (ref 43–77)
RDW: 14.6 % (ref 11.5–15.5)

## 2013-05-10 MED ORDER — ALBUTEROL SULFATE (5 MG/ML) 0.5% IN NEBU
5.0000 mg | INHALATION_SOLUTION | Freq: Once | RESPIRATORY_TRACT | Status: AC
  Administered 2013-05-10: 5 mg via RESPIRATORY_TRACT
  Filled 2013-05-10: qty 1

## 2013-05-10 MED ORDER — LEVOFLOXACIN 750 MG PO TABS: 750.0000 mg | ORAL_TABLET | Freq: Every day | ORAL | Status: DC

## 2013-05-10 MED ORDER — IPRATROPIUM BROMIDE 0.02 % IN SOLN
0.5000 mg | Freq: Once | RESPIRATORY_TRACT | Status: AC
  Administered 2013-05-10: 0.5 mg via RESPIRATORY_TRACT
  Filled 2013-05-10: qty 2.5

## 2013-05-10 NOTE — ED Notes (Signed)
Pt returned from radiology.

## 2013-05-10 NOTE — ED Notes (Signed)
Pharmacy tech at bedside 

## 2013-05-10 NOTE — ED Provider Notes (Signed)
CSN: [id number]     Arrival date & time 05/10/13  4098 History   First MD Initiated Contact with Patient 05/10/13 [phone]     Chief Complaint  Patient presents with  . Cough  . Breathing Problem   (Consider location/radiation/quality/duration/timing/severity/associated sxs/prior Treatment) HPI Comments: Patient presents to the ER for evaluation of cough and shortness of breath. Patient reports that she has been sick for more than a week. She was given Mucinex and a Z-Pak by her primary doctor approximately a week ago but has not improved. This morning she feels much worse. Patient reports that upon awakening this morning she felt like she was having severe difficulty breathing. Chest is very tight. Patient reports persistent cough that is nonproductive. She has not had any fever.  Patient is a 62 y.o. female presenting with cough and difficulty breathing.  Cough Associated symptoms: shortness of breath   Associated symptoms: no fever   Breathing Problem Associated symptoms include shortness of breath.    Past Medical History  Diagnosis Date  . DM (diabetes mellitus)   . Obesity   . Dyslipidemia   . HTN (hypertension)   . Hypothyroidism   . Gastroparesis   . Migraine   . Osteoporosis   . Colon polyps 7/07    hyperplastic and adenomatous  . Depression   . Nephrolithiasis   . Diverticulosis   . External hemorrhoid   . Fatty liver    Past Surgical History  Procedure Laterality Date  . Cesarean section  '79  '84  . Tubal ligation  '85  . Breast reduction surgery  '88  . Cervical disc surgery  '98    fusion  . Carpal tunnel release  2000    right wrist  . Rotator cuff repair  '09    left shoulder  . Ulnar nerve repair  '02, '08    left done, then right   Family History  Problem Relation Age of Onset  . COPD Father   . Stroke Father   . Heart failure Brother   . Heart failure Mother   . Hypertension Brother   . Heart attack Mother     several  . Colon cancer     mat. 1st cousin  . Other      celiac sprue, 1/2 brother  . Diabetes Mother   . Diabetes Brother     x 3  . Lung cancer Brother   . Cirrhosis Sister     liver transplant  . Kidney disease Mother   . Emphysema Father    History  Substance Use Topics  . Smoking status: Never Smoker   . Smokeless tobacco: Never Used  . Alcohol Use: No   OB History   Grav Para Term Preterm Abortions TAB SAB Ect Mult Living                 Review of Systems  Constitutional: Negative for fever.  Respiratory: Positive for cough, chest tightness and shortness of breath.   All other systems reviewed and are negative.    Allergies  Nsaids  Home Medications   Current Outpatient Rx  Name  Route  Sig  Dispense  Refill  . albuterol (PROVENTIL HFA;VENTOLIN HFA) 108 (90 BASE) MCG/ACT inhaler   Inhalation   Inhale 2 puffs into the lungs every 6 (six) hours as needed. For shortness of breath.         Marland Kitchen amLODipine (NORVASC) 5 MG tablet               .  calcium-vitamin D (CALCIUM 500/D) 500-200 MG-UNIT per tablet   Oral   Take 2 tablets by mouth daily.         . carisoprodol (SOMA) 350 MG tablet   Oral   Take 350 mg by mouth 4 (four) times daily as needed. For joint pain.         . Coenzyme Q10 (CO Q 10) 100 MG CAPS   Oral   Take 2 tablets by mouth daily.         . fenofibrate (TRICOR) 145 MG tablet   Oral   Take 145 mg by mouth daily.         Marland Kitchen gabapentin (NEURONTIN) 300 MG capsule   Oral   Take 300 mg by mouth 3 (three) times daily.         Marland Kitchen glipiZIDE (GLUCOTROL) 5 MG tablet   Oral   Take 5 mg by mouth 2 (two) times daily before a meal.         . HYDROcodone-acetaminophen (NORCO) 5-325 MG per tablet   Oral   Take 1 tablet by mouth every 6 (six) hours as needed. For joint and back pain.         Marland Kitchen levothyroxine (SYNTHROID, LEVOTHROID) 75 MCG tablet   Oral   Take 75 mcg by mouth daily.         Marland Kitchen losartan (COZAAR) 50 MG tablet   Oral   Take 100 mg by mouth  daily.         . metoCLOPramide (REGLAN) 10 MG tablet   Oral   Take 10 mg by mouth 2 (two) times daily.          . montelukast (SINGULAIR) 10 MG tablet   Oral   Take 10 mg by mouth at bedtime.         Marland Kitchen omeprazole (PRILOSEC) 20 MG capsule   Oral   Take 20 mg by mouth 2 (two) times daily.          . pioglitazone-metformin (ACTOPLUS MET) 15-850 MG per tablet   Oral   Take 1 tablet by mouth 2 (two) times daily with a meal.         . pioglitazone-metformin (ACTOPLUS MET) 15-850 MG per tablet   Oral   Take 1 tablet by mouth 2 (two) times daily with a meal.         . topiramate (TOPAMAX) 50 MG tablet   Oral   Take 50 mg by mouth 2 (two) times daily.         . Vitamin D, Ergocalciferol, (DRISDOL) 50000 UNITS CAPS   Oral   Take 50,000 Units by mouth every 7 (seven) days. Take on Mondays         . zolpidem (AMBIEN) 10 MG tablet   Oral   Take 10 mg by mouth at bedtime as needed. For sleep.          BP 184/84  Temp(Src) 98 F (36.7 C) (Oral)  Resp 20  SpO2 98% Physical Exam  Constitutional: She is oriented to person, place, and time. She appears well-developed and well-nourished. No distress.  HENT:  Head: Normocephalic and atraumatic.  Right Ear: Hearing normal.  Left Ear: Hearing normal.  Nose: Nose normal.  Mouth/Throat: Oropharynx is clear and moist and mucous membranes are normal.  Eyes: Conjunctivae and EOM are normal. Pupils are equal, round, and reactive to light.  Neck: Normal range of motion. Neck supple.  Cardiovascular: Regular rhythm, S1 normal and S2  normal.  Exam reveals no gallop and no friction rub.   No murmur heard. Pulmonary/Chest: Effort normal and breath sounds normal. No respiratory distress. She exhibits no tenderness.  Abdominal: Soft. Normal appearance and bowel sounds are normal. There is no hepatosplenomegaly. There is no tenderness. There is no rebound, no guarding, no tenderness at McBurney's point and negative Murphy's sign.  No hernia.  Musculoskeletal: Normal range of motion.  Neurological: She is alert and oriented to person, place, and time. She has normal strength. No cranial nerve deficit or sensory deficit. Coordination normal. GCS eye subscore is 4. GCS verbal subscore is 5. GCS motor subscore is 6.  Skin: Skin is warm, dry and intact. No rash noted. No cyanosis.  Psychiatric: She has a normal mood and affect. Her speech is normal and behavior is normal. Thought content normal.    ED Course  Procedures (including critical care time)  EKG:  Date: 05/10/2013  Rate: 94  Rhythm: normal sinus rhythm  QRS Axis: left  Intervals: normal  ST/T Wave abnormalities: normal  Conduction Disutrbances:left anterior fascicular block  Narrative Interpretation:   Old EKG Reviewed: none available    Labs Review Labs Reviewed  CBC WITH DIFFERENTIAL - Abnormal; Notable for the following:    Hemoglobin 11.3 (*)    HCT 33.6 (*)    All other components within normal limits  BASIC METABOLIC PANEL - Abnormal; Notable for the following:    Sodium 125 (*)    Chloride 93 (*)    CO2 18 (*)    Glucose, Bld 178 (*)    All other components within normal limits  PRO B NATRIURETIC PEPTIDE - Abnormal; Notable for the following:    Pro B Natriuretic peptide (BNP) 149.3 (*)    All other components within normal limits  POCT I-STAT 3, BLOOD GAS (G3+) - Abnormal; Notable for the following:    pCO2 arterial 30.5 (*)    pO2, Arterial 103.0 (*)    Bicarbonate 18.1 (*)    Acid-base deficit 6.0 (*)    All other components within normal limits  TROPONIN I   Imaging Review Dg Chest 2 View  05/10/2013   *RADIOLOGY REPORT*  Clinical Data: Cough, shortness of breath  CHEST - 2 VIEW  Comparison: None.  Findings: Lungs are clear. No pleural effusion or pneumothorax.  The heart is top normal in size.  Degenerative changes of the visualized thoracolumbar spine. Cervical spine fixation hardware.  Left lateral/distal clavicular resorption.   IMPRESSION: No evidence of acute cardiopulmonary disease.   Original Report Authenticated By: Charline Bills, M.D.    MDM  Diagnosis: 1. Bronchitis 2. Hyponatremia  Patient presents to the ER for more than one week of difficulty breathing and cough. She was treated with Zithromax by her primary doctor but did not improve. Patient does have a prior Heller, but did not use until one time last night. She did not have any significant relief. Patient reports tightness in her chest associated with breathing. EKG does not show any evidence of acute ischemia or infarct. Troponin was negative. Patient had significant improvement of her symptoms after albuterol nebulizer.  Blood work Reveals hyponatremia. Bicarbonate was slightly low, but the gas does not show any evidence of acidosis. There is no CO2 retention her oxygenation is adequate. She does not have an anion gap.   Patient has mildly elevated glucose. It is felt that the patient's symptoms are likely secondary to bronchospasm, she had significant improvement with albuterol. She will continue aggressive  bronchodilator therapy as an outpatient. Paranasal considered however with her history of diabetes and elevated glucose, it is felt that this would possibly be problematic. Agent prescribed Levaquin in addition to bronchodilator therapy, followup with Doctor Clelia Croft in one to 2 days to have blood work repeated to recheck sodium.    Gilda Crease, MD 05/10/13 [phone]

## 2013-05-10 NOTE — ED Notes (Signed)
Neb finished, sts she feels a little better. Pt instructed to get dressed and go to d/c desk.

## 2013-07-14 ENCOUNTER — Other Ambulatory Visit: Payer: Self-pay

## 2013-08-08 ENCOUNTER — Ambulatory Visit (INDEPENDENT_AMBULATORY_CARE_PROVIDER_SITE_OTHER): Payer: Medicare Other | Admitting: Internal Medicine

## 2013-08-08 ENCOUNTER — Encounter: Payer: Self-pay | Admitting: Internal Medicine

## 2013-08-08 VITALS — BP 122/64 | HR 100 | Temp 98.5°F | Resp 10 | Ht 61.0 in | Wt 211.0 lb

## 2013-08-08 DIAGNOSIS — E119 Type 2 diabetes mellitus without complications: Secondary | ICD-10-CM

## 2013-08-08 DIAGNOSIS — E139 Other specified diabetes mellitus without complications: Secondary | ICD-10-CM | POA: Insufficient documentation

## 2013-08-08 MED ORDER — INSULIN PEN NEEDLE 31G X 8 MM MISC: Status: DC

## 2013-08-08 MED ORDER — SYRINGE (DISPOSABLE) 1 ML MISC: Status: DC

## 2013-08-08 MED ORDER — INSULIN REGULAR HUMAN (CONC) 500 UNIT/ML ~~LOC~~ SOLN: SUBCUTANEOUS | Status: DC

## 2013-08-08 NOTE — Progress Notes (Addendum)
Patient ID: Danielle Vargas, female   DOB: May 01, 1951, 62 y.o.   MRN: [id number]  HPI: Danielle Vargas is a 62 y.o.-year-old female, referred by her PCP, Dr. Clelia Croft, for management of DM2, insulin-dependent, uncontrolled, with complications (gastroparesis, peripheral neuropathy, OU DR). She previously saw Dr Sharl Ma.   Patient has been diagnosed with diabetes in 1999  she started insulin in 10/2012.  Last hemoglobin A1c was: 7.9%, 07/07/2013. Previously lower.  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  Pt is on a regimen of: - Humalog 75/25 Kwikpen 92 units tid She tried Lantus but "it did nothing for me". She tried Levemir with same effect. She has been on Glipizide in the past, too.  Pt checks her sugars 5 a day and they are - per download of her meter: - am: 84-151 (247 highest) - 2h after b'fast: 120-257 - before lunch: 128-290 - 2h after lunch: 92-248 - before dinner: 141-245 - 2h after dinner: 153-224 - bedtime: 95-276 - nighttime: 161-310  No lows. Lowest sugar was 84 x1; she has hypoglycemia awareness at 95.  Highest sugar was 300s.  Pt's meals are: - Breakfast: cheese toast + diet coke - Lunch: sandwich;  salad + cheese + croutons;  baked potato - Dinner: leftovers or corn bread + pinto beans + occas. Cole slaw - Snacks: no She drinks diet Cokes: 1 x 2L bottle   - no CKD, last BUN/creatinine:  Lab Results  Component Value Date   BUN 11 05/10/2013   CREATININE 0.75 05/10/2013  She is on Losartan. - pt has HL, but no lipids available for review She is on Fenofibrate. - last eye exam was in 04/2013 - Dr Ashley Royalty. R>L DR.  - + numbness and tingling in her feet. On neuropathy.   I reviewed her chart and she also has a history of HTN, HL, fatty liver, GERD, morbid obesity, hypothyroidism, migraines, chronic back pain, history of nephrolithiasis, osteoporosis, diverticulosis.  Pt has FH of DM in mother, 3 brothers.   ROS: Constitutional: + weight gain, + fatigue, no  subjective hyperthermia/hypothermia, + poor sleep Eyes:+ blurry vision, no xerophthalmia ENT: no sore throat, no nodules palpated in throat, no dysphagia/odynophagia, no hoarseness Cardiovascular: no CP/+SOB/palpitations/leg swelling Respiratory: no cough/+SOB/+ wheezing Gastrointestinal: no N/V/D/C Musculoskeletal: no muscle/+ joint aches Skin: no rashes Neurological: no tremors/numbness/tingling/dizziness Psychiatric: no depression/anxiety  Past Medical History  Diagnosis Date  . DM (diabetes mellitus)   . Obesity   . Dyslipidemia   . HTN (hypertension)   . Hypothyroidism   . Gastroparesis   . Migraine   . Osteoporosis   . Colon polyps 7/07    hyperplastic and adenomatous  . Depression   . Nephrolithiasis   . Diverticulosis   . External hemorrhoid   . Fatty liver    Past Surgical History  Procedure Laterality Date  . Tubal ligation  '85  . Breast reduction surgery  '88  . Cervical disc surgery  '98    fusion  . Carpal tunnel release  2000    right wrist  . Rotator cuff repair  '09    left shoulder  . Ulnar nerve repair  '02, '08    left done, then right  . Cesarean section  '79  '84   History   Social History  . Marital Status: Divorced    Spouse Name: N/A    Number of Children: 2   Occupational History  . disabled    Social History Main Topics  . Smoking  status: Never Smoker   . Smokeless tobacco: Never Used  . Alcohol Use: No  . Drug Use: No   Social History Narrative   Regular exercise: none   Caffeine use: daily; dt coke   Current Outpatient Prescriptions on File Prior to Visit  Medication Sig Dispense Refill  . albuterol (PROVENTIL HFA;VENTOLIN HFA) 108 (90 BASE) MCG/ACT inhaler Inhale 2 puffs into the lungs every 6 (six) hours as needed. For shortness of breath.      Marland Kitchen amLODipine (NORVASC) 5 MG tablet Take 5 mg by mouth daily.       . calcium-vitamin D (CALCIUM 500/D) 500-200 MG-UNIT per tablet Take 2 tablets by mouth daily.      .  carisoprodol (SOMA) 350 MG tablet Take 350 mg by mouth 4 (four) times daily.       . Coenzyme Q10 (CO Q 10) 100 MG CAPS Take 2 tablets by mouth daily.      . fenofibrate (TRICOR) 145 MG tablet Take 160 mg by mouth daily.       Marland Kitchen gabapentin (NEURONTIN) 300 MG capsule Take 300 mg by mouth 3 (three) times daily.      Marland Kitchen HYDROcodone-acetaminophen (NORCO) 5-325 MG per tablet Take 1 tablet by mouth every 6 (six) hours as needed. For joint and back pain.      Marland Kitchen levothyroxine (SYNTHROID, LEVOTHROID) 75 MCG tablet Take 75 mcg by mouth daily.      Marland Kitchen losartan (COZAAR) 50 MG tablet Take 50 mg by mouth 2 (two) times daily.       . metoCLOPramide (REGLAN) 10 MG tablet Take 10 mg by mouth 4 (four) times daily.       . montelukast (SINGULAIR) 10 MG tablet Take 10 mg by mouth at bedtime.      Marland Kitchen omeprazole (PRILOSEC) 20 MG capsule Take 20 mg by mouth 2 (two) times daily.       Marland Kitchen topiramate (TOPAMAX) 50 MG tablet Take 50 mg by mouth 2 (two) times daily.      . Vitamin D, Ergocalciferol, (DRISDOL) 50000 UNITS CAPS Take 50,000 Units by mouth 2 (two) times a week. Take on Mondays       No current facility-administered medications on file prior to visit.   Allergies  Allergen Reactions  . Nsaids Other (See Comments)    GI problems   Family History  Problem Relation Age of Onset  . COPD Father   . Stroke Father   . Heart failure Brother   . Heart failure Mother   . Hypertension Brother   . Heart attack Mother     several  . Colon cancer      mat. 1st cousin  . Other      celiac sprue, 1/2 brother  . Diabetes Mother   . Diabetes Brother     x 3  . Lung cancer Brother   . Cirrhosis Sister     liver transplant  . Kidney disease Mother   . Emphysema Father    PE: BP 122/64  Pulse 100  Temp(Src) 98.5 F (36.9 C) (Oral)  Resp 10  Ht 5\' 1"  (1.549 m)  Wt 211 lb (95.709 kg)  BMI 39.89 kg/m2  SpO2 95% Wt Readings from Last 3 Encounters:  08/08/13 211 lb (95.709 kg)  06/17/12 211 lb (95.709 kg)   05/21/12 211 lb 12.8 oz (96.072 kg)   Constitutional: overweight, in NAD Eyes: PERRLA, EOMI, no exophthalmos ENT: moist mucous membranes, no thyromegaly, no cervical lymphadenopathy Cardiovascular:  RRR, No MRG Respiratory: CTA B Gastrointestinal: abdomen soft, NT, ND, BS+ Musculoskeletal: no deformities, strength intact in all 4 Skin: moist, warm, no rashes Neurological: no tremor with outstretched hands, DTR normal in all 4  ASSESSMENT: 1. DM2, insulin-dependent, uncontrolled, with complications - peripheral neuropathy, on Neurontin - Gastroparesis, on Reglan - Dr. 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 Patient with long-standing, recently more uncontrolled diabetes, on high dose premixed insulin regimen, which became insufficient. - We discussed about options for treatment, and I suggested to:  Patient Instructions  Stop the Humalog 75/25. Start U500 insulin as follows: 0.10 mL before breakfast - 0.10 mL before lunch - 0.12 mL before dinner. Please inject 30 min before a meal. Please return in 3 weeks with your sugar log.  - I demonstrated how to pull-up in the syringe the desired amount of insulin - she understood the instructions - we discussed about the difference between U500 and U100 insulin - I called in tuberculin syringes and needles to her pharmacy - Strongly advised her to start checking sugars at different times of the day and write them down - check 3 times a day, rotating checks - given sugar log and advised how to fill it and to bring it at next appt  - given foot care handout and explained the principles  - given instructions for hypoglycemia management "15-15 rule"  - advised for yearly eye exams - discussed about the fact that her diet is indeed low-carb, however it is higher fact and this can increase her insulin resistance - Return to clinic in 3 weeks with sugar log- but call with either highs or lows

## 2013-08-08 NOTE — Patient Instructions (Addendum)
Stop the Humalog 75/25. Start U500 insulin as follows: 0.10 mL before breakfast - 0.10 mL before lunch - 0.12 mL before dinner. Please inject 30 min before a meal. Please return in 3 weeks with your sugar log.    PATIENT INSTRUCTIONS FOR TYPE 2 DIABETES:  DIET AND EXERCISE Diet and exercise is an important part of diabetic treatment.  We recommended aerobic exercise in the form of brisk walking (working between 40-60% of maximal aerobic capacity, similar to brisk walking) for 150 minutes per week (such as 30 minutes five days per week) along with 3 times per week performing 'resistance' training (using various gauge rubber tubes with handles) 5-10 exercises involving the major muscle groups (upper body, lower body and core) performing 10-15 repetitions (or near fatigue) each exercise. Start at half the above goal but build slowly to reach the above goals. If limited by weight, joint pain, or disability, we recommend daily walking in a swimming pool with water up to waist to reduce pressure from joints while allow for adequate exercise.    BLOOD GLUCOSES Monitoring your blood glucoses is important for continued management of your diabetes. Please check your blood glucoses 2-4 times a day: fasting, before meals and at bedtime (you can rotate these measurements - e.g. one day check before the 3 meals, the next day check before 2 of the meals and before bedtime, etc.   HYPOGLYCEMIA (low blood sugar) Hypoglycemia is usually a reaction to not eating, exercising, or taking too much insulin/ other diabetes drugs.  Symptoms include tremors, sweating, hunger, confusion, headache, etc. Treat IMMEDIATELY with 15 grams of Carbs:   4 glucose tablets    cup regular juice/soda   2 tablespoons raisins   4 teaspoons sugar   1 tablespoon honey Recheck blood glucose in 15 mins and repeat above if still symptomatic/blood glucose <100. Please contact our office at [phone] if you have questions about how to  next handle your insulin.  RECOMMENDATIONS TO REDUCE YOUR RISK OF DIABETIC COMPLICATIONS: * Take your prescribed MEDICATION(S). * Follow a DIABETIC diet: Complex carbs, fiber rich foods, heart healthy fish twice weekly, (monounsaturated and polyunsaturated) fats * AVOID saturated/trans fats, high fat foods, >2,300 mg salt per day. * EXERCISE at least 5 times a week for 30 minutes or preferably daily.  * DO NOT SMOKE OR DRINK more than 1 drink a day. * Check your FEET every day. Do not wear tightfitting shoes. Contact us if you develop an ulcer * See your EYE doctor once a year or more if needed * Get a FLU shot once a year * Get a PNEUMONIA vaccine once before and once after age 1 years  GOALS:  * Your Hemoglobin A1c of <7%  * fasting sugars need to be <130 * after meals sugars need to be <180 (2h after you start eating) * Your Systolic BP should be 140 or lower  * Your Diastolic BP should be 80 or lower  * Your HDL (Good Cholesterol) should be 40 or higher  * Your LDL (Bad Cholesterol) should be 100 or lower  * Your Triglycerides should be 150 or lower  * Your Urine microalbumin (kidney function) should be <30 * Your Body Mass Index should be 25 or lower   We will be glad to help you achieve these goals. Our telephone number is: [phone].

## 2013-08-16 ENCOUNTER — Other Ambulatory Visit: Payer: Self-pay | Admitting: *Deleted

## 2013-08-16 MED ORDER — "INSULIN SYRINGE 31G X 5/16"" 1 ML MISC": Status: DC

## 2013-08-24 ENCOUNTER — Other Ambulatory Visit (HOSPITAL_COMMUNITY): Payer: Self-pay | Admitting: Internal Medicine

## 2013-08-24 ENCOUNTER — Encounter (HOSPITAL_COMMUNITY): Payer: Self-pay

## 2013-08-24 ENCOUNTER — Ambulatory Visit (HOSPITAL_COMMUNITY)
Admission: RE | Admit: 2013-08-24 | Discharge: 2013-08-24 | Disposition: A | Discharge status: Home / Self Care | Payer: Medicare Other | Source: Ambulatory Visit | Attending: Internal Medicine | Admitting: Internal Medicine

## 2013-08-24 DIAGNOSIS — M810 Age-related osteoporosis without current pathological fracture: Secondary | ICD-10-CM | POA: Insufficient documentation

## 2013-08-24 MED ORDER — SODIUM CHLORIDE 0.9 % IV SOLN
Freq: Once | INTRAVENOUS | Status: AC
  Administered 2013-08-24: 14:00:00 via INTRAVENOUS

## 2013-08-24 MED ORDER — ZOLEDRONIC ACID 5 MG/100ML IV SOLN
5.0000 mg | Freq: Once | INTRAVENOUS | Status: AC
  Administered 2013-08-24: 5 mg via INTRAVENOUS
  Filled 2013-08-24: qty 100

## 2013-08-26 ENCOUNTER — Ambulatory Visit: Payer: Medicare Other | Admitting: Internal Medicine

## 2013-09-02 ENCOUNTER — Ambulatory Visit (INDEPENDENT_AMBULATORY_CARE_PROVIDER_SITE_OTHER): Payer: Medicare Other | Admitting: Internal Medicine

## 2013-09-02 ENCOUNTER — Encounter: Payer: Self-pay | Admitting: Internal Medicine

## 2013-09-02 VITALS — BP 130/82 | HR 102 | Temp 98.5°F | Resp 12 | Ht 61.0 in | Wt 210.9 lb

## 2013-09-02 DIAGNOSIS — E119 Type 2 diabetes mellitus without complications: Secondary | ICD-10-CM

## 2013-09-02 DIAGNOSIS — E139 Other specified diabetes mellitus without complications: Secondary | ICD-10-CM

## 2013-09-02 MED ORDER — INSULIN REGULAR HUMAN (CONC) 500 UNIT/ML ~~LOC~~ SOLN: SUBCUTANEOUS | Status: DC

## 2013-09-02 NOTE — Patient Instructions (Addendum)
U500: Inject under skin: 0.11 mL with breakfast, 0.11 mL with lunch, and 0.10 mL with dinner   Please come back for a follow-up appointment in 1 month with your sugar log (after Jan 30).  Happy New Year!

## 2013-09-02 NOTE — Progress Notes (Signed)
Patient ID: Danielle Vargas, female   DOB: 1950/12/30, 62 y.o.   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 Last visit ~ 1 mo ago.  Last hemoglobin A1c was: 7.9%, 07/07/2013. Previously lower.  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  Pt was on a regimen of: - Metformin 1000 mg bid - Humalog 75/25 Kwikpen 92 units tid (total daily dose 276 units) She tried Lantus but "it did nothing for me". She tried Levemir with same effect. She has been on Glipizide in the past, too. She tried Invokana in the past >> severe diarrhea  At last visit, we started: - Metformin 1000 mg bid - U500 0.1-0.1-0.12 units before meals (total daily dose: 152 units)  Pt checks her sugars 5-6x a day and they are - brings a detailed log - highest sugars around Christmas - mostly similar with prior: - am: 84-151 (247 highest) >>99-248, with 2 sugars at 59 - 2h after b'fast: 120-257 >> 163-271 - before lunch: 128-290 >> 113-257 - 2h after lunch: 92-248 >> 200-266 - before dinner: 141-245 >> 141-264 - 2h after dinner: 153-224 >> 110-298 - bedtime: 95-276 >> 88-266 - nighttime: 161-310 >> n/c  No lows. Lowest sugar was 88x1; she has hypoglycemia awareness at 95.  Highest sugar was 298.  Pt's meals are: - Breakfast: cheese toast + diet coke - Lunch: sandwich;  salad + cheese + croutons;  baked potato - Dinner: leftovers or corn bread + pinto beans + occas. Cole slaw - Snacks: no  - no CKD, last BUN/creatinine:  Lab Results  Component Value Date   BUN 11 05/10/2013   CREATININE 0.75 05/10/2013  She is on Losartan. - pt has HL, but no lipids available for review She is on Fenofibrate. - last eye exam was in 04/2013 - Dr Ashley Royalty. R>L DR.  - + numbness and tingling in her feet. On neuropathy.   I reviewed her chart and she  also has a history of HTN, HL, fatty liver, GERD, morbid obesity, hypothyroidism, migraines, chronic back pain, history of nephrolithiasis, osteoporosis, diverticulosis.  I reviewed pt's medications, allergies, PMH, social hx, family hx and no changes required, except as mentioned above.  ROS: Constitutional:no weight gain/loss, + fatigue, no subjective hyperthermia/hypothermia, + poor sleep Eyes:no blurry vision, no xerophthalmia ENT: no sore throat, no nodules palpated in throat, no dysphagia/odynophagia, no hoarseness Cardiovascular: no CP/no SOB/palpitations/leg swelling Respiratory: + cough/no SOB/ wheezing Gastrointestinal: no N/V/D/C Musculoskeletal: no muscle/no joint aches Skin: no rashes Neurological: no tremors/numbness/tingling/dizziness  PE: BP 130/82  Pulse 102  Temp(Src) 98.5 F (36.9 C)  Resp 12  Ht 5\' 1"  (1.549 m)  Wt 210 lb 14.4 oz (95.664 kg)  BMI 39.87 kg/m2  SpO2 97% Wt Readings from Last 3 Encounters:  09/02/13 210 lb 14.4 oz (95.664 kg)  08/24/13 211 lb (95.709 kg)  08/08/13 211 lb (95.709 kg)   Constitutional: overweight, in NAD Eyes: PERRLA, EOMI, no exophthalmos ENT: moist mucous membranes, no thyromegaly, no cervical lymphadenopathy Cardiovascular: RRR, No MRG Respiratory: CTA B Gastrointestinal: abdomen soft, NT, ND, BS+ Musculoskeletal: no deformities, strength intact in all 4 Skin: moist, warm, no rashes  ASSESSMENT: 1. DM2, insulin-dependent, uncontrolled, with complications - peripheral neuropathy, on Neurontin - Gastroparesis, on Reglan - Dr. 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 Patient with long-standing, recently more  uncontrolled diabetes, recently started on U500 insulin, halving the total daily insulin dose with similar control. She does have some lows in am. She injects the U500 at the time she starts eating >> advised to take it 30 min before. We might need U100 regular insulin SSI in the future, but would not want to complicate  regimen too much.  - We discussed about options for treatment, and I suggested to:    Patient Instructions   U500: Inject under skin: 0.11 mL with breakfast, 0.11 mL with lunch, and 0.10 mL with dinner  Please come back for a follow-up appointment in 1 month with your sugar log (after Jan 30). - continue checking sugars at different times of the day and write them down - she is doing an exemplary job with this - advised for yearly eye exams - has one coming up in 10/2012 - Return to clinic in 1 mo with sugar log- will check A1c then

## 2013-09-17 ENCOUNTER — Emergency Department (HOSPITAL_COMMUNITY)
Admission: EM | Admit: 2013-09-17 | Discharge: 2013-09-17 | Disposition: A | Discharge status: Home / Self Care | Payer: Medicare Other | Attending: Emergency Medicine | Admitting: Emergency Medicine

## 2013-09-17 ENCOUNTER — Emergency Department (HOSPITAL_COMMUNITY): Payer: Medicare Other

## 2013-09-17 ENCOUNTER — Encounter (HOSPITAL_COMMUNITY): Payer: Self-pay | Admitting: Emergency Medicine

## 2013-09-17 DIAGNOSIS — F3289 Other specified depressive episodes: Secondary | ICD-10-CM | POA: Insufficient documentation

## 2013-09-17 DIAGNOSIS — J209 Acute bronchitis, unspecified: Secondary | ICD-10-CM | POA: Insufficient documentation

## 2013-09-17 DIAGNOSIS — I10 Essential (primary) hypertension: Secondary | ICD-10-CM | POA: Insufficient documentation

## 2013-09-17 DIAGNOSIS — Z794 Long term (current) use of insulin: Secondary | ICD-10-CM | POA: Insufficient documentation

## 2013-09-17 DIAGNOSIS — R0602 Shortness of breath: Secondary | ICD-10-CM | POA: Insufficient documentation

## 2013-09-17 DIAGNOSIS — E039 Hypothyroidism, unspecified: Secondary | ICD-10-CM | POA: Insufficient documentation

## 2013-09-17 DIAGNOSIS — Z79899 Other long term (current) drug therapy: Secondary | ICD-10-CM | POA: Insufficient documentation

## 2013-09-17 DIAGNOSIS — E119 Type 2 diabetes mellitus without complications: Secondary | ICD-10-CM | POA: Insufficient documentation

## 2013-09-17 DIAGNOSIS — F329 Major depressive disorder, single episode, unspecified: Secondary | ICD-10-CM | POA: Insufficient documentation

## 2013-09-17 DIAGNOSIS — E785 Hyperlipidemia, unspecified: Secondary | ICD-10-CM | POA: Insufficient documentation

## 2013-09-17 DIAGNOSIS — R071 Chest pain on breathing: Secondary | ICD-10-CM | POA: Insufficient documentation

## 2013-09-17 DIAGNOSIS — E669 Obesity, unspecified: Secondary | ICD-10-CM | POA: Insufficient documentation

## 2013-09-17 DIAGNOSIS — J40 Bronchitis, not specified as acute or chronic: Secondary | ICD-10-CM

## 2013-09-17 DIAGNOSIS — M546 Pain in thoracic spine: Secondary | ICD-10-CM | POA: Insufficient documentation

## 2013-09-17 DIAGNOSIS — R091 Pleurisy: Secondary | ICD-10-CM | POA: Insufficient documentation

## 2013-09-17 DIAGNOSIS — M810 Age-related osteoporosis without current pathological fracture: Secondary | ICD-10-CM | POA: Insufficient documentation

## 2013-09-17 LAB — CBC WITH DIFFERENTIAL/PLATELET
Basophils Absolute: 0.1 10*3/uL (ref 0.0–0.1)
Basophils Relative: 1 % (ref 0–1)
EOS ABS: 0.3 10*3/uL (ref 0.0–0.7)
Eosinophils Relative: 3 % (ref 0–5)
HCT: 36.1 % (ref 36.0–46.0)
Hemoglobin: 12.2 g/dL (ref 12.0–15.0)
LYMPHS ABS: 2.1 10*3/uL (ref 0.7–4.0)
Lymphocytes Relative: 23 % (ref 12–46)
MCH: 28.3 pg (ref 26.0–34.0)
MCHC: 33.8 g/dL (ref 30.0–36.0)
MCV: 83.8 fL (ref 78.0–100.0)
Monocytes Absolute: 0.6 10*3/uL (ref 0.1–1.0)
Monocytes Relative: 6 % (ref 3–12)
NEUTROS PCT: 68 % (ref 43–77)
Neutro Abs: 6.3 10*3/uL (ref 1.7–7.7)
PLATELETS: 350 10*3/uL (ref 150–400)
RBC: 4.31 MIL/uL (ref 3.87–5.11)
RDW: 15 % (ref 11.5–15.5)
WBC: 9.2 10*3/uL (ref 4.0–10.5)

## 2013-09-17 LAB — BASIC METABOLIC PANEL
BUN: 18 mg/dL (ref 6–23)
CO2: 23 mEq/L (ref 19–32)
Calcium: 9.8 mg/dL (ref 8.4–10.5)
Chloride: 96 mEq/L (ref 96–112)
Creatinine, Ser: 1.1 mg/dL (ref 0.50–1.10)
GFR, EST AFRICAN AMERICAN: 61 mL/min — AB (ref >90)
GFR, EST NON AFRICAN AMERICAN: 53 mL/min — AB (ref >90)
GLUCOSE: 149 mg/dL — AB (ref 70–99)
POTASSIUM: 4.7 meq/L (ref 3.7–5.3)
SODIUM: 134 meq/L — AB (ref 137–147)

## 2013-09-17 LAB — D-DIMER, QUANTITATIVE (NOT AT ARMC)

## 2013-09-17 LAB — TROPONIN I

## 2013-09-17 MED ORDER — ALBUTEROL SULFATE HFA 108 (90 BASE) MCG/ACT IN AERS
4.0000 | INHALATION_SPRAY | Freq: Once | RESPIRATORY_TRACT | Status: AC
  Administered 2013-09-17: 4 via RESPIRATORY_TRACT
  Filled 2013-09-17: qty 6.7

## 2013-09-17 MED ORDER — BENZONATATE 100 MG PO CAPS: 100.0000 mg | ORAL_CAPSULE | Freq: Three times a day (TID) | ORAL | Status: DC

## 2013-09-17 NOTE — ED Notes (Signed)
States that she has been coughing for the past week. States that she has right rib pain and back pain (thoracic/lumbar) pain. States that she coughs and has sharp pain.

## 2013-09-17 NOTE — ED Notes (Signed)
Bed: WA02 Expected date:  Expected time:  Means of arrival:  Comments: Closed until 11

## 2013-09-17 NOTE — ED Provider Notes (Signed)
CSN: [id number]     Arrival date & time 09/17/13  3354 History   First MD Initiated Contact with Patient 09/17/13 1112     Chief Complaint  Patient presents with  . Cough  . Chest Pain  . Back Pain   (Consider location/radiation/quality/duration/timing/severity/associated sxs/prior Treatment) Patient is a 63 y.o. female presenting with cough, chest pain, and back pain. The history is provided by the patient. No language interpreter was used.  Cough Cough characteristics:  Dry Severity:  Severe Onset quality:  Gradual Duration:  17 days Timing:  Intermittent Progression:  Worsening Chronicity:  New Smoker: no   Relieved by: ibuprofen. Worsened by:  Deep breathing (cough) Associated symptoms: chest pain and shortness of breath   Associated symptoms: no chills, no diaphoresis, no eye discharge, no fever, no headaches, no rhinorrhea and no sore throat   Chest pain:    Quality:  Dull   Severity:  Mild   Duration:  1 day   Timing:  Constant   Progression:  Unchanged Shortness of breath:    Severity:  Mild   Onset quality:  Gradual   Duration:  1 week   Timing:  Intermittent   Progression:  Waxing and waning Risk factors: recent infection   Chest Pain Associated symptoms: back pain, cough and shortness of breath   Associated symptoms: no abdominal pain, no diaphoresis, no fatigue, no fever, no headache, no nausea, no numbness, no palpitations, not vomiting and no weakness   Back Pain Location:  Thoracic spine Quality:  Stabbing Radiates to:  Does not radiate Pain severity:  Severe Duration:  1 day Timing:  Intermittent Progression:  Waxing and waning Chronicity:  New Relieved by:  Nothing (aleve) Worsened by:  Coughing Associated symptoms: chest pain   Associated symptoms: no abdominal pain, no dysuria, no fever, no headaches, no numbness, no pelvic pain and no weakness   Risk factors: obesity     Past Medical History  Diagnosis Date  . DM (diabetes mellitus)   .  Obesity   . Dyslipidemia   . HTN (hypertension)   . Hypothyroidism   . Gastroparesis   . Migraine   . Osteoporosis   . Colon polyps 7/07    hyperplastic and adenomatous  . Depression   . Nephrolithiasis   . Diverticulosis   . External hemorrhoid   . Fatty liver    Past Surgical History  Procedure Laterality Date  . Tubal ligation  '85  . Breast reduction surgery  '88  . Cervical disc surgery  '98    fusion  . Carpal tunnel release  2000    right wrist  . Rotator cuff repair  '09    left shoulder  . Ulnar nerve repair  '02, '08    left done, then right  . Cesarean section  '79  '84   Family History  Problem Relation Age of Onset  . COPD Father   . Stroke Father   . Heart failure Brother   . Heart failure Mother   . Hypertension Brother   . Heart attack Mother     several  . Colon cancer      mat. 1st cousin  . Other      celiac sprue, 1/2 brother  . Diabetes Mother   . Diabetes Brother     x 3  . Lung cancer Brother   . Cirrhosis Sister     liver transplant  . Kidney disease Mother   . Emphysema Father  History  Substance Use Topics  . Smoking status: Never Smoker   . Smokeless tobacco: Never Used  . Alcohol Use: No   OB History   Grav Para Term Preterm Abortions TAB SAB Ect Mult Living                 Review of Systems  Constitutional: Negative for fever, chills, diaphoresis, activity change, appetite change and fatigue.  HENT: Negative for congestion, facial swelling, rhinorrhea and sore throat.   Eyes: Negative for photophobia and discharge.  Respiratory: Positive for cough and shortness of breath. Negative for chest tightness.   Cardiovascular: Positive for chest pain. Negative for palpitations and leg swelling.  Gastrointestinal: Negative for nausea, vomiting, abdominal pain and diarrhea.  Endocrine: Negative for polydipsia and polyuria.  Genitourinary: Negative for dysuria, frequency, difficulty urinating and pelvic pain.  Musculoskeletal:  Positive for back pain. Negative for arthralgias, neck pain and neck stiffness.  Skin: Negative for color change and wound.  Allergic/Immunologic: Negative for immunocompromised state.  Neurological: Negative for facial asymmetry, weakness, numbness and headaches.  Hematological: Does not bruise/bleed easily.  Psychiatric/Behavioral: Negative for confusion and agitation.    Allergies  Invokana and Nsaids  Home Medications   Current Outpatient Rx  Name  Route  Sig  Dispense  Refill  . albuterol (PROVENTIL HFA;VENTOLIN HFA) 108 (90 BASE) MCG/ACT inhaler   Inhalation   Inhale 2 puffs into the lungs every 6 (six) hours as needed. For shortness of breath.         Marland Kitchen amLODipine (NORVASC) 5 MG tablet   Oral   Take 5 mg by mouth daily.          . calcium-vitamin D (CALCIUM 500/D) 500-200 MG-UNIT per tablet   Oral   Take 2 tablets by mouth daily.         . carisoprodol (SOMA) 350 MG tablet   Oral   Take 350 mg by mouth 4 (four) times daily.          . Coenzyme Q10 (CO Q 10) 100 MG CAPS   Oral   Take 2 tablets by mouth daily.         . fenofibrate 160 MG tablet   Oral   Take 160 mg by mouth at bedtime.          . gabapentin (NEURONTIN) 300 MG capsule   Oral   Take 300 mg by mouth 3 (three) times daily.         . hydrochlorothiazide (MICROZIDE) 12.5 MG capsule   Oral   Take 12.5 mg by mouth every morning.         Marland Kitchen HYDROcodone-acetaminophen (NORCO) 5-325 MG per tablet   Oral   Take 1 tablet by mouth every 6 (six) hours as needed. For joint and back pain.         Marland Kitchen insulin regular human CONCENTRATED (HUMULIN R) 500 UNIT/ML SOLN injection   Subcutaneous   Inject 10-12 Units into the skin 3 (three) times daily with meals. Inject under skin: 0.12 mL with breakfast, 0.12 mL with lunch, and 0.10 mL with dinner         . levothyroxine (SYNTHROID, LEVOTHROID) 75 MCG tablet   Oral   Take 75 mcg by mouth daily before breakfast.          . losartan (COZAAR)  50 MG tablet   Oral   Take 50 mg by mouth 2 (two) times daily.          Marland Kitchen  metFORMIN (GLUCOPHAGE) 1000 MG tablet   Oral   Take 1,000 mg by mouth 2 (two) times daily with a meal.         . metoCLOPramide (REGLAN) 10 MG tablet   Oral   Take 10 mg by mouth 4 (four) times daily.          . montelukast (SINGULAIR) 10 MG tablet   Oral   Take 10 mg by mouth at bedtime.         Marland Kitchen omeprazole (PRILOSEC) 20 MG capsule   Oral   Take 20 mg by mouth 2 (two) times daily.          Marland Kitchen topiramate (TOPAMAX) 50 MG tablet   Oral   Take 50 mg by mouth 2 (two) times daily.         . Vitamin D, Ergocalciferol, (DRISDOL) 50000 UNITS CAPS   Oral   Take 50,000 Units by mouth every 7 (seven) days. Take on thursdays         . benzonatate (TESSALON) 100 MG capsule   Oral   Take 1 capsule (100 mg total) by mouth every 8 (eight) hours.   21 capsule   0   . Insulin Pen Needle (CLICKFINE PEN NEEDLES) 31G X 8 MM MISC      Use 3x a day   100 each   prn   . Insulin Syringe-Needle U-100 (INSULIN SYRINGE 1CC/31GX5/16") 31G X 5/16" 1 ML MISC      Use to inject insulin 2 times daily.   100 each   2   . Syringe, Disposable, (BD TUBERCULIN SYRINGE) 1 ML MISC      Use 3x a day   100 each   prn    BP 143/65  Pulse 102  Temp(Src) 97.5 F (36.4 C) (Oral)  Resp 20  SpO2 96% Physical Exam  Constitutional: She is oriented to person, place, and time. She appears well-developed and well-nourished. No distress.  HENT:  Head: Normocephalic and atraumatic.  Mouth/Throat: No oropharyngeal exudate.  Eyes: Pupils are equal, round, and reactive to light.  Neck: Normal range of motion. Neck supple.  Cardiovascular: Normal rate, regular rhythm and normal heart sounds.  Exam reveals no gallop and no friction rub.   No murmur heard. Pulmonary/Chest: Effort normal and breath sounds normal. No respiratory distress. She has no wheezes. She has no rales. She exhibits tenderness.  Abdominal: Soft. Bowel  sounds are normal. She exhibits no distension and no mass. There is no tenderness. There is no rebound and no guarding.  Musculoskeletal: Normal range of motion. She exhibits no edema and no tenderness.       Back:  Neurological: She is alert and oriented to person, place, and time.  Skin: Skin is warm and dry.  Psychiatric: She has a normal mood and affect.    ED Course  Procedures (including critical care time) Labs Review Labs Reviewed  BASIC METABOLIC PANEL - Abnormal; Notable for the following:    Sodium 134 (*)    Glucose, Bld 149 (*)    GFR calc non Af Amer 53 (*)    GFR calc Af Amer 61 (*)    All other components within normal limits  CBC WITH DIFFERENTIAL  TROPONIN I  D-DIMER, QUANTITATIVE   Imaging Review Dg Chest 2 View  09/17/2013   CLINICAL DATA:  Cough, congestion  EXAM: CHEST  2 VIEW  COMPARISON:  05/10/2013  FINDINGS: Cardiomediastinal silhouette is stable. No acute infiltrate or pleural effusion.  No pulmonary edema. Central mild bronchitic changes. Metallic fixation plate cervical spine again noted. Stable degenerative changes mid thoracic spine.  IMPRESSION: No acute infiltrate or pleural effusion. Central mild bronchitic changes.   Electronically Signed   By: Lahoma Crocker M.D.   On: 09/17/2013 12:31    EKG Interpretation    Date/Time:  Saturday September 17 2013 13:51:22 EST Ventricular Rate:  97 PR Interval:  150 QRS Duration: 99 QT Interval:  365 QTC Calculation: 464 R Axis:   -54 Text Interpretation:  Sinus rhythm LAD, consider left anterior fascicular block Low voltage, precordial leads RSR' in V1 or V2, right VCD or RVH No significant change was found Confirmed by DOCHERTY  MD, MEGAN ([id number]) on 09/17/2013 2:04:40 PM            MDM   1. Bronchitis   2. Pleurisy    Pt is a 63 y.o. female with Pmhx as above who presents with URI symptoms since 12/25 which had improved after 1wk augmentin? By PCP before having worsening cough, SOB about 1 week ago  with 1 day of sharp back pains worse w/ cough and chest tightness.  Denies fever, chills, n/v, d/a (resolved about 1 week ago), leg pain.swelling.  On PE, VSS, pt in NAD.  Cardiopulm, abdominal exam benign except for reproducible pain across thoracic back & chest wall.  EKG unchanged from prior.  Trop, d-dimer nml, CXR w mild bronchitic changes.  Suspect viral bronchitis w/ pleurisy.  Doubt Pna, PE, ACS.  Pt does not want to start steroids given her DM.  Will start her on tessalon, scheduled albuterol Q 4 hrs and recommend close PCP f/u.  Return precautions given for new or worsening symptoms including worsening pain, SOB, leg pain or swelling.        Neta Ehlers, MD 09/17/13 1900

## 2013-09-17 NOTE — Discharge Instructions (Signed)
Bronchitis Bronchitis is swelling (inflammation) of the air tubes leading to your lungs (bronchi). This causes mucus and a cough. If the swelling gets bad, you may have trouble breathing. HOME CARE   Rest.  Drink enough fluids to keep your pee (urine) clear or pale yellow (unless you have a condition where you have to watch how much you drink).  Only take medicine as told by your doctor. If you were given antibiotic medicines, finish them even if you start to feel better.  Avoid smoke, irritating chemicals, and strong smells. These make the problem worse. Quit smoking if you smoke. This helps your lungs heal faster.  Use a cool mist humidifier. Change the water in the humidifier every day. You can also sit in the bathroom with hot shower running for 5 10 minutes. Keep the door closed.  See your health care provider as told.  Wash your hands often. GET HELP IF: Your problems do not get better after 1 week. GET HELP RIGHT AWAY IF:   Your fever gets worse.  You have chills.  Your chest hurts.  Your problems breathing get worse.  You have blood in your mucus.  You pass out (faint).  You feel lightheaded.  You have a bad headache.  You throw up (vomit) again and again. MAKE SURE YOU:  Understand these instructions.  Will watch your condition.  Will get help right away if you are not doing well or get worse. Document Released: 02/11/2008 Document Revised: 06/15/2013 Document Reviewed: 04/19/2013 [facility] Patient Information 2014 [place], [place]. Pleurisy Pleurisy is an inflammation and swelling of the lining of the lungs (pleura). Because of this inflammation, it hurts to breathe. It can be aggravated by coughing, laughing, or deep breathing. Pleurisy is often caused by an underlying infection or disease.  HOME CARE INSTRUCTIONS  Monitor your pleurisy for any changes. The following actions may help to alleviate any discomfort you are experiencing:  Medicine may help with  pain. Only take over-the-counter or prescription medicines for pain, discomfort, or fever as directed by your health care provider.  Only take antibiotic medicine as directed. Make sure to finish it even if you start to feel better. SEEK MEDICAL CARE IF:   Your pain is not controlled with medicine or is increasing.  You have an increase in pus-like (purulent) secretions brought up with coughing. SEEK IMMEDIATE MEDICAL CARE IF:   You have blue or dark lips, fingernails, or toenails.  You are coughing up blood.  You have increased difficulty breathing.  You have continuing pain unrelieved by medicine or pain lasting more than 1 week.  You have pain that radiates into your neck, arms, or jaw.  You develop increased shortness of breath or wheezing.  You develop a fever, rash, vomiting, fainting, or other serious symptoms. MAKE SURE YOU:  Understand these instructions.   Will watch your condition.   Will get help right away if you are not doing well or get worse.  Document Released: 08/25/2005 Document Revised: 04/27/2013 Document Reviewed: 02/06/2013 [facility] Patient Information 2014 [place].

## 2013-09-19 ENCOUNTER — Other Ambulatory Visit: Payer: Self-pay | Admitting: *Deleted

## 2013-09-19 MED ORDER — "INSULIN SYRINGE 31G X 5/16"" 1 ML MISC": Status: DC

## 2013-09-23 ENCOUNTER — Other Ambulatory Visit (HOSPITAL_COMMUNITY): Payer: Medicare Other | Admitting: Internal Medicine

## 2013-09-23 DIAGNOSIS — J45901 Unspecified asthma with (acute) exacerbation: Secondary | ICD-10-CM

## 2013-09-23 LAB — PULMONARY FUNCTION TEST
DL/VA % PRED: 87 %
DL/VA: 3.7 ml/min/mmHg/L
DLCO UNC % PRED: 66 %
DLCO UNC: 12.59 ml/min/mmHg
DLCO cor % pred: 69 %
DLCO cor: 13.1 ml/min/mmHg
FEF 25-75 Post: 2.43 L/sec
FEF 25-75 Pre: 2.45 L/sec
FEF2575-%Change-Post: -1 %
FEF2575-%Pred-Post: 116 %
FEF2575-%Pred-Pre: 117 %
FEV1-%CHANGE-POST: 0 %
FEV1-%PRED-POST: 97 %
FEV1-%Pred-Pre: 97 %
FEV1-POST: 2.11 L
FEV1-Pre: 2.11 L
FEV1FVC-%CHANGE-POST: 3 %
FEV1FVC-%Pred-Pre: 105 %
FEV6-%Change-Post: -3 %
FEV6-%PRED-PRE: 94 %
FEV6-%Pred-Post: 91 %
FEV6-PRE: 2.56 L
FEV6-Post: 2.47 L
FEV6FVC-%PRED-POST: 104 %
FEV6FVC-%Pred-Pre: 104 %
FVC-%Change-Post: -3 %
FVC-%PRED-POST: 88 %
FVC-%Pred-Pre: 91 %
FVC-PRE: 2.56 L
FVC-Post: 2.47 L
POST FEV6/FVC RATIO: 100 %
PRE FEV6/FVC RATIO: 100 %
Post FEV1/FVC ratio: 85 %
Pre FEV1/FVC ratio: 83 %
RV % PRED: 113 %
RV: 2.05 L
TLC % pred: 102 %
TLC: 4.58 L

## 2013-09-28 ENCOUNTER — Other Ambulatory Visit: Payer: Self-pay | Admitting: *Deleted

## 2013-09-28 MED ORDER — "INSULIN SYRINGE 31G X 5/16"" 1 ML MISC": Status: DC

## 2013-10-05 ENCOUNTER — Telehealth: Payer: Self-pay | Admitting: *Deleted

## 2013-10-05 NOTE — Telephone Encounter (Signed)
Pt called stating that spoke with [facility] and they told her that the Humulin R-500 will cost her $1,100 for a 3 mth supply. Pt stated that she cannot afford this. Then pt realized that her vial was out of date. Pt states she still has some U-100 and wants to know if she can take this until her appt on Monday, Feb. 2nd. Also, pt wants to discuss trying Lantus. Pt is contacting her insurance company to see if they cover it. Advised pt that Dr Cruzita Lederer is out of the office this afternoon. Pt states ok to let her know tomorrow. Please advise to dosage of the U-100 for pt.

## 2013-10-06 NOTE — Telephone Encounter (Signed)
Oh, that is too bad. She needs to apply for [employer] assistance for U500! We may need a PA ?? Until then, go back to 75/25 at a slightly increased dose of 100 units 3x a day. She was taking 92 units 3x a day before she came to me.

## 2013-10-06 NOTE — Telephone Encounter (Signed)
Called pt and advised her per Dr Arman Filter note. Pt said ok. Pt wanted to to know what time to inject her insulin. Dr Cruzita Lederer said to 15 min before her meal. Pt said she understood and would discuss medication when she comes in on Monday.

## 2013-10-10 ENCOUNTER — Encounter: Payer: Self-pay | Admitting: Internal Medicine

## 2013-10-10 ENCOUNTER — Ambulatory Visit (INDEPENDENT_AMBULATORY_CARE_PROVIDER_SITE_OTHER): Payer: Medicare Other | Admitting: Internal Medicine

## 2013-10-10 ENCOUNTER — Other Ambulatory Visit: Payer: Self-pay | Admitting: *Deleted

## 2013-10-10 VITALS — BP 132/62 | HR 114 | Temp 98.0°F | Resp 12 | Wt 214.6 lb

## 2013-10-10 DIAGNOSIS — E119 Type 2 diabetes mellitus without complications: Secondary | ICD-10-CM

## 2013-10-10 DIAGNOSIS — E139 Other specified diabetes mellitus without complications: Secondary | ICD-10-CM

## 2013-10-10 LAB — HEMOGLOBIN A1C: HEMOGLOBIN A1C: 7.3 % — AB (ref 4.6–6.5)

## 2013-10-10 MED ORDER — INSULIN GLARGINE 100 UNIT/ML ~~LOC~~ SOLN: 100.0000 [IU] | Freq: Every day | SUBCUTANEOUS | Status: DC

## 2013-10-10 MED ORDER — "INSULIN SYRINGE-NEEDLE U-100 31G X 5/16"" 1 ML MISC": Status: DC

## 2013-10-10 MED ORDER — INSULIN LISPRO 100 UNIT/ML (KWIKPEN): PEN_INJECTOR | SUBCUTANEOUS | Status: DC

## 2013-10-10 NOTE — Progress Notes (Signed)
Patient ID: Danielle Vargas, female   DOB: 1951-03-29, 63 y.o.   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 Last visit ~ 1 mo ago.  She had bronchitis all January >> still symptomatic.  Last hemoglobin A1c was: 7.9%, 07/07/2013. Previously lower.  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  Pt was on a regimen of: - Metformin 1000 mg bid - Humalog 75/25 Kwikpen 92 units tid (total daily dose 276 units) She tried Lantus but "it did nothing for me". She tried Levemir with same effect. She has been on Glipizide in the past, too. She tried Invokana in the past >> severe diarrhea  At last visit, we started: - Metformin 1000 mg bid - U500 0.1-0.1-0.12 units before meals (total daily dose: 152 units)  We had to go back to the 75/25 b/c of insurance issues (10/05/2013): - 100 units tid >> decreased to 96 units tid b/c low CBGs  Pt checks her sugars 5-6x a day and they are - brings a detailed log - has more lows lately, mostly b/w meals and at night >> had to back off insulin doses: - am: 84-151 (247 highest) >>99-248, with 2 sugars at 59 >> 122-188 - 2h after b'fast: 120-257 >> 163-271 >> 155-191 - before lunch: 128-290 >> 113-257 >> 77-141 - 2h after lunch: 92-248 >> 200-266 >> 129-143 - before dinner: 141-245 >> 141-264 >> 64-108 - 2h after dinner: 153-224 >> 110-298 >> 180-209 - bedtime: 95-276 >> 88-266 >> 127-204 - nighttime: 161-310 >> n/c >> 75-232  She has lows - lowest 64; she has hypoglycemia awareness at 95.  Highest sugar was 209.  Pt's meals are: - Breakfast: cheese toast + diet coke - Lunch: sandwich;  salad + cheese + croutons;  baked potato - Dinner: leftovers or corn bread + pinto beans + occas. Cole slaw - Snacks: no  - has mild CKD, last BUN/creatinine:  Lab Results  Component Value  Date   BUN 18 09/17/2013   CREATININE 1.10 09/17/2013  She is on Losartan. - pt has HL, but no lipids available for review She is on Fenofibrate. - last eye exam was in 04/2013 - Dr Zigmund Daniel. R>L DR.  - + numbness and tingling in her feet. On neurontin.  I reviewed her chart and she also has a history of HTN, HL, fatty liver, GERD, morbid obesity, hypothyroidism, migraines, chronic back pain, history of nephrolithiasis, osteoporosis, diverticulosis.  I reviewed pt's medications, allergies, PMH, social hx, family hx and no changes required, except as mentioned above. 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  ROS: Constitutional:no weight gain/loss, + fatigue, no subjective hyperthermia/hypothermia Eyes:no blurry vision, no xerophthalmia ENT: + sore throat, no nodules palpated in throat, no dysphagia/odynophagia, no hoarseness Cardiovascular: no CP/no SOB/palpitations/leg swelling Respiratory: + cough/+ SOB/+ wheezing Gastrointestinal: no N/V/D/C Musculoskeletal: no muscle/+ joint pain Skin: no rashes Neurological: no tremors/numbness/tingling/dizziness  PE: BP 132/62  Pulse 114  Temp(Src) 98 F (36.7 C) (Oral)  Resp 12  Wt 214 lb 9.6 oz (97.342 kg)  SpO2 97% Wt Readings from Last 3 Encounters:  10/10/13 214 lb 9.6 oz (97.342 kg)  09/02/13 210 lb 14.4 oz (95.664 kg)  08/24/13 211 lb (95.709 kg)   Constitutional: overweight, in NAD Eyes: PERRLA, EOMI, no exophthalmos ENT: moist mucous membranes, no thyromegaly, no cervical lymphadenopathy Cardiovascular: RRR, No MRG Respiratory: CTA  B Gastrointestinal: abdomen soft, NT, ND, BS+ Musculoskeletal: no deformities, strength intact in all 4 Skin: moist, warm, no rashes  ASSESSMENT: 1. DM2, insulin-dependent, uncontrolled, with complications - peripheral neuropathy, on Neurontin - Gastroparesis, on Reglan - Dr. 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 Patient with long-standing, recently more uncontrolled diabetes, recently started  on U500 insulin, which she cannot afford. She needs to have a high 75/25 daily insulin dose to cover her meals, but this is causing her to have lows at night and between meals 2/2 too high basal daily dose.  - We discussed about options for treatment, and I suggested to:  Patient Instructions  Please stop the 75/25 insulin and start: - Lantus 100 units at night - Humalog mealtime insulin:  Breakfast: 40 units  Lunch: 40 units  Dinner: 40 units Please call me in 2 weeks with your sugars or sooner if you have sugars consistently <80 or >200. Please return in 1 month with your sugar log.  - given Lantus pen samples - sent Lantus and Humalog Rxs to pharmacy. Also sent syringes. continue checking sugars at different times of the day and write them down - she is doing an exemplary job with this - has an eye appt coming up this mo with Dr Zigmund Daniel - check A1c today - Return to clinic in 1 mo with sugar log  Office Visit on 10/10/2013  Component Date Value Range Status  . Hemoglobin A1C 10/10/2013 7.3* 4.6 - 6.5 % Final   Glycemic Control Guidelines for People with Diabetes:Non Diabetic:  <6%Goal of Therapy: <7%Additional Action Suggested:  >8%    HbA1c improved.

## 2013-10-10 NOTE — Patient Instructions (Signed)
Please stop the 75/25 insulin and start: - Lantus 100 units at night - Humalog mealtime insulin:  Breakfast: 40 units  Lunch: 40 units  Dinner: 40 units Please call me in 2 weeks with your sugars or sooner if you have sugars consistently <80 or >200. Please return in 1 month with your sugar log.

## 2013-10-14 ENCOUNTER — Ambulatory Visit (HOSPITAL_COMMUNITY)
Admission: RE | Admit: 2013-10-14 | Discharge: 2013-10-14 | Disposition: A | Discharge status: Home / Self Care | Payer: Medicare Other | Source: Ambulatory Visit | Attending: Internal Medicine | Admitting: Internal Medicine

## 2013-10-14 DIAGNOSIS — J45909 Unspecified asthma, uncomplicated: Secondary | ICD-10-CM | POA: Insufficient documentation

## 2013-10-14 MED ORDER — ALBUTEROL SULFATE (2.5 MG/3ML) 0.083% IN NEBU
2.5000 mg | INHALATION_SOLUTION | Freq: Once | RESPIRATORY_TRACT | Status: AC
  Administered 2013-10-14: 2.5 mg via RESPIRATORY_TRACT

## 2013-10-26 ENCOUNTER — Inpatient Hospital Stay (HOSPITAL_COMMUNITY)
Admission: EM | Admit: 2013-10-26 | Discharge: 2013-10-29 | DRG: 250 | Disposition: A | Discharge status: Home / Self Care | Payer: Medicare Other | Source: Ambulatory Visit | Attending: Interventional Cardiology | Admitting: Interventional Cardiology

## 2013-10-26 ENCOUNTER — Encounter (HOSPITAL_COMMUNITY)
Admission: EM | Disposition: A | Discharge status: Home / Self Care | Payer: Medicare Other | Source: Ambulatory Visit | Attending: Interventional Cardiology

## 2013-10-26 ENCOUNTER — Encounter (HOSPITAL_COMMUNITY): Payer: Self-pay | Admitting: Nurse Practitioner

## 2013-10-26 DIAGNOSIS — Z794 Long term (current) use of insulin: Secondary | ICD-10-CM

## 2013-10-26 DIAGNOSIS — I10 Essential (primary) hypertension: Secondary | ICD-10-CM | POA: Diagnosis present

## 2013-10-26 DIAGNOSIS — Z6841 Body Mass Index (BMI) 40.0 and over, adult: Secondary | ICD-10-CM

## 2013-10-26 DIAGNOSIS — M810 Age-related osteoporosis without current pathological fracture: Secondary | ICD-10-CM | POA: Diagnosis present

## 2013-10-26 DIAGNOSIS — E119 Type 2 diabetes mellitus without complications: Secondary | ICD-10-CM | POA: Diagnosis present

## 2013-10-26 DIAGNOSIS — E669 Obesity, unspecified: Secondary | ICD-10-CM | POA: Diagnosis present

## 2013-10-26 DIAGNOSIS — I2510 Atherosclerotic heart disease of native coronary artery without angina pectoris: Secondary | ICD-10-CM

## 2013-10-26 DIAGNOSIS — I5041 Acute combined systolic (congestive) and diastolic (congestive) heart failure: Secondary | ICD-10-CM | POA: Diagnosis present

## 2013-10-26 DIAGNOSIS — F329 Major depressive disorder, single episode, unspecified: Secondary | ICD-10-CM | POA: Diagnosis present

## 2013-10-26 DIAGNOSIS — I252 Old myocardial infarction: Secondary | ICD-10-CM | POA: Diagnosis present

## 2013-10-26 DIAGNOSIS — Z8249 Family history of ischemic heart disease and other diseases of the circulatory system: Secondary | ICD-10-CM

## 2013-10-26 DIAGNOSIS — E039 Hypothyroidism, unspecified: Secondary | ICD-10-CM | POA: Diagnosis present

## 2013-10-26 DIAGNOSIS — E785 Hyperlipidemia, unspecified: Secondary | ICD-10-CM | POA: Diagnosis present

## 2013-10-26 DIAGNOSIS — I2121 ST elevation (STEMI) myocardial infarction involving left circumflex coronary artery: Secondary | ICD-10-CM

## 2013-10-26 DIAGNOSIS — I219 Acute myocardial infarction, unspecified: Secondary | ICD-10-CM

## 2013-10-26 DIAGNOSIS — Z9861 Coronary angioplasty status: Secondary | ICD-10-CM

## 2013-10-26 DIAGNOSIS — Z833 Family history of diabetes mellitus: Secondary | ICD-10-CM

## 2013-10-26 DIAGNOSIS — I213 ST elevation (STEMI) myocardial infarction of unspecified site: Secondary | ICD-10-CM

## 2013-10-26 DIAGNOSIS — T502X5A Adverse effect of carbonic-anhydrase inhibitors, benzothiadiazides and other diuretics, initial encounter: Secondary | ICD-10-CM | POA: Diagnosis not present

## 2013-10-26 DIAGNOSIS — I5032 Chronic diastolic (congestive) heart failure: Secondary | ICD-10-CM | POA: Diagnosis not present

## 2013-10-26 DIAGNOSIS — I2129 ST elevation (STEMI) myocardial infarction involving other sites: Principal | ICD-10-CM

## 2013-10-26 DIAGNOSIS — N179 Acute kidney failure, unspecified: Secondary | ICD-10-CM | POA: Diagnosis not present

## 2013-10-26 DIAGNOSIS — F3289 Other specified depressive episodes: Secondary | ICD-10-CM | POA: Diagnosis present

## 2013-10-26 DIAGNOSIS — I5181 Takotsubo syndrome: Secondary | ICD-10-CM | POA: Diagnosis present

## 2013-10-26 DIAGNOSIS — K7689 Other specified diseases of liver: Secondary | ICD-10-CM | POA: Diagnosis present

## 2013-10-26 HISTORY — PX: LEFT HEART CATHETERIZATION WITH CORONARY ANGIOGRAM: SHX5451

## 2013-10-26 HISTORY — DX: Atherosclerotic heart disease of native coronary artery without angina pectoris: I25.10

## 2013-10-26 LAB — CBC
HCT: 36.4 % (ref 36.0–46.0)
Hemoglobin: 11.7 g/dL — ABNORMAL LOW (ref 12.0–15.0)
MCH: 27.9 pg (ref 26.0–34.0)
MCHC: 32.1 g/dL (ref 30.0–36.0)
MCV: 86.7 fL (ref 78.0–100.0)
PLATELETS: 340 10*3/uL (ref 150–400)
RBC: 4.2 MIL/uL (ref 3.87–5.11)
RDW: 15.8 % — AB (ref 11.5–15.5)
WBC: 13.3 10*3/uL — AB (ref 4.0–10.5)

## 2013-10-26 LAB — COMPREHENSIVE METABOLIC PANEL
ALBUMIN: 3.8 g/dL (ref 3.5–5.2)
ALBUMIN: 3.5 g/dL (ref 3.5–5.2)
ALK PHOS: 47 U/L (ref 39–117)
ALT: 26 U/L (ref 0–35)
ALT: 29 U/L (ref 0–35)
AST: 41 U/L — AB (ref 0–37)
AST: 82 U/L — ABNORMAL HIGH (ref 0–37)
Alkaline Phosphatase: 50 U/L (ref 39–117)
BILIRUBIN TOTAL: 0.3 mg/dL (ref 0.3–1.2)
BUN: 21 mg/dL (ref 6–23)
BUN: 19 mg/dL (ref 6–23)
CALCIUM: 8.9 mg/dL (ref 8.4–10.5)
CHLORIDE: 100 meq/L (ref 96–112)
CO2: 18 meq/L — AB (ref 19–32)
CO2: 17 mEq/L — ABNORMAL LOW (ref 19–32)
CREATININE: 1.18 mg/dL — AB (ref 0.50–1.10)
Calcium: 9.2 mg/dL (ref 8.4–10.5)
Chloride: 97 mEq/L (ref 96–112)
Creatinine, Ser: 1.03 mg/dL (ref 0.50–1.10)
GFR calc Af Amer: 56 mL/min — ABNORMAL LOW (ref >90)
GFR calc Af Amer: 67 mL/min — ABNORMAL LOW (ref >90)
GFR calc non Af Amer: 57 mL/min — ABNORMAL LOW (ref >90)
GFR, EST NON AFRICAN AMERICAN: 49 mL/min — AB (ref >90)
Glucose, Bld: 240 mg/dL — ABNORMAL HIGH (ref 70–99)
Glucose, Bld: 228 mg/dL — ABNORMAL HIGH (ref 70–99)
POTASSIUM: 4.9 meq/L (ref 3.7–5.3)
Potassium: 4.8 mEq/L (ref 3.7–5.3)
Sodium: 136 mEq/L — ABNORMAL LOW (ref 137–147)
Sodium: 130 mEq/L — ABNORMAL LOW (ref 137–147)
TOTAL PROTEIN: 7.3 g/dL (ref 6.0–8.3)
Total Bilirubin: 0.4 mg/dL (ref 0.3–1.2)
Total Protein: 7.7 g/dL (ref 6.0–8.3)

## 2013-10-26 LAB — LIPID PANEL
CHOLESTEROL: 137 mg/dL (ref 0–200)
HDL: 29 mg/dL — ABNORMAL LOW (ref >39)
LDL Cholesterol: 78 mg/dL (ref 0–99)
Total CHOL/HDL Ratio: 4.7 RATIO
Triglycerides: 149 mg/dL (ref <150)
VLDL: 30 mg/dL (ref 0–40)

## 2013-10-26 LAB — CBC WITH DIFFERENTIAL/PLATELET
BASOS ABS: 0 10*3/uL (ref 0.0–0.1)
Basophils Relative: 0 % (ref 0–1)
EOS ABS: 0.1 10*3/uL (ref 0.0–0.7)
Eosinophils Relative: 1 % (ref 0–5)
HCT: 35 % — ABNORMAL LOW (ref 36.0–46.0)
Hemoglobin: 11.3 g/dL — ABNORMAL LOW (ref 12.0–15.0)
Lymphocytes Relative: 14 % (ref 12–46)
Lymphs Abs: 1.7 10*3/uL (ref 0.7–4.0)
MCH: 27.9 pg (ref 26.0–34.0)
MCHC: 32.3 g/dL (ref 30.0–36.0)
MCV: 86.4 fL (ref 78.0–100.0)
MONO ABS: 0.7 10*3/uL (ref 0.1–1.0)
Monocytes Relative: 6 % (ref 3–12)
NEUTROS ABS: 9.7 10*3/uL — AB (ref 1.7–7.7)
Neutrophils Relative %: 79 % — ABNORMAL HIGH (ref 43–77)
Platelets: 335 10*3/uL (ref 150–400)
RBC: 4.05 MIL/uL (ref 3.87–5.11)
RDW: 15.9 % — AB (ref 11.5–15.5)
WBC: 12.3 10*3/uL — ABNORMAL HIGH (ref 4.0–10.5)

## 2013-10-26 LAB — POCT I-STAT, CHEM 8
BUN: 20 mg/dL (ref 6–23)
CALCIUM ION: 1.27 mmol/L (ref 1.13–1.30)
CREATININE: 1.2 mg/dL — AB (ref 0.50–1.10)
Chloride: 105 mEq/L (ref 96–112)
Glucose, Bld: 241 mg/dL — ABNORMAL HIGH (ref 70–99)
HEMATOCRIT: 39 % (ref 36.0–46.0)
Hemoglobin: 13.3 g/dL (ref 12.0–15.0)
Potassium: 4.8 mEq/L (ref 3.7–5.3)
Sodium: 137 mEq/L (ref 137–147)
TCO2: 18 mmol/L (ref 0–100)

## 2013-10-26 LAB — GLUCOSE, CAPILLARY
GLUCOSE-CAPILLARY: 238 mg/dL — AB (ref 70–99)
GLUCOSE-CAPILLARY: 183 mg/dL — AB (ref 70–99)

## 2013-10-26 LAB — TROPONIN I
Troponin I: 2.52 ng/mL (ref <0.30)
Troponin I: 14.2 ng/mL (ref <0.30)

## 2013-10-26 LAB — POCT ACTIVATED CLOTTING TIME
Activated Clotting Time: 143 seconds
Activated Clotting Time: 359 seconds

## 2013-10-26 LAB — HEMOGLOBIN A1C
HEMOGLOBIN A1C: 7.1 % — AB (ref <5.7)
Mean Plasma Glucose: 157 mg/dL — ABNORMAL HIGH (ref <117)

## 2013-10-26 LAB — CK TOTAL AND CKMB (NOT AT ARMC)
CK TOTAL: 176 U/L (ref 7–177)
CK, MB: 10.6 ng/mL — AB (ref 0.3–4.0)
RELATIVE INDEX: 6 — AB (ref 0.0–2.5)

## 2013-10-26 LAB — MRSA PCR SCREENING: MRSA by PCR: NEGATIVE

## 2013-10-26 LAB — PLATELET COUNT: PLATELETS: 319 10*3/uL (ref 150–400)

## 2013-10-26 SURGERY — LEFT HEART CATHETERIZATION WITH CORONARY ANGIOGRAM
Anesthesia: LOCAL

## 2013-10-26 MED ORDER — SODIUM CHLORIDE 0.9 % IV SOLN
INTRAVENOUS | Status: AC
  Administered 2013-10-26: 23:00:00 via INTRAVENOUS

## 2013-10-26 MED ORDER — TOPIRAMATE 25 MG PO TABS
50.0000 mg | ORAL_TABLET | Freq: Two times a day (BID) | ORAL | Status: DC
  Administered 2013-10-26 – 2013-10-29 (×6): 50 mg via ORAL
  Filled 2013-10-26 (×7): qty 2

## 2013-10-26 MED ORDER — NITROGLYCERIN 0.4 MG SL SUBL: 0.4000 mg | SUBLINGUAL_TABLET | SUBLINGUAL | Status: DC | PRN

## 2013-10-26 MED ORDER — MIDAZOLAM HCL 2 MG/2ML IJ SOLN
INTRAMUSCULAR | Status: AC
  Filled 2013-10-26: qty 2

## 2013-10-26 MED ORDER — HYDROCODONE-ACETAMINOPHEN 5-325 MG PO TABS
1.0000 | ORAL_TABLET | Freq: Four times a day (QID) | ORAL | Status: DC | PRN
  Administered 2013-10-27 – 2013-10-29 (×6): 1 via ORAL
  Filled 2013-10-26 (×6): qty 1

## 2013-10-26 MED ORDER — NITROGLYCERIN IN D5W 200-5 MCG/ML-% IV SOLN
INTRAVENOUS | Status: AC
  Filled 2013-10-26: qty 250

## 2013-10-26 MED ORDER — SODIUM CHLORIDE 0.9 % IV SOLN: 250.0000 mL | INTRAVENOUS | Status: DC | PRN

## 2013-10-26 MED ORDER — VERAPAMIL HCL 2.5 MG/ML IV SOLN
INTRAVENOUS | Status: AC
  Filled 2013-10-26: qty 2

## 2013-10-26 MED ORDER — TIROFIBAN HCL IV 5 MG/100ML
0.1500 ug/kg/min | INTRAVENOUS | Status: DC
  Administered 2013-10-26 – 2013-10-27 (×2): 0.15 ug/kg/min via INTRAVENOUS
  Filled 2013-10-26 (×4): qty 100

## 2013-10-26 MED ORDER — PERFLUTREN LIPID MICROSPHERE
INTRAVENOUS | Status: AC
  Administered 2013-10-26: 1.5 mL via INTRAVENOUS
  Filled 2013-10-26: qty 10

## 2013-10-26 MED ORDER — LIDOCAINE HCL (PF) 1 % IJ SOLN
INTRAMUSCULAR | Status: AC
  Filled 2013-10-26: qty 30

## 2013-10-26 MED ORDER — FENTANYL CITRATE 0.05 MG/ML IJ SOLN
INTRAMUSCULAR | Status: AC
  Filled 2013-10-26: qty 2

## 2013-10-26 MED ORDER — SODIUM CHLORIDE 0.9 % IJ SOLN
3.0000 mL | Freq: Two times a day (BID) | INTRAMUSCULAR | Status: DC
  Administered 2013-10-26 – 2013-10-28 (×4): 3 mL via INTRAVENOUS

## 2013-10-26 MED ORDER — BUDESONIDE-FORMOTEROL FUMARATE 80-4.5 MCG/ACT IN AERO
2.0000 | INHALATION_SPRAY | Freq: Two times a day (BID) | RESPIRATORY_TRACT | Status: DC
  Administered 2013-10-26 – 2013-10-28 (×4): 2 via RESPIRATORY_TRACT
  Filled 2013-10-26 (×2): qty 6.9

## 2013-10-26 MED ORDER — ALBUTEROL SULFATE (2.5 MG/3ML) 0.083% IN NEBU
3.0000 mL | INHALATION_SOLUTION | Freq: Four times a day (QID) | RESPIRATORY_TRACT | Status: DC | PRN
  Administered 2013-10-27: 3 mL via RESPIRATORY_TRACT
  Filled 2013-10-26: qty 3

## 2013-10-26 MED ORDER — GABAPENTIN 300 MG PO CAPS
300.0000 mg | ORAL_CAPSULE | Freq: Three times a day (TID) | ORAL | Status: DC
  Administered 2013-10-26 – 2013-10-29 (×9): 300 mg via ORAL
  Filled 2013-10-26 (×12): qty 1

## 2013-10-26 MED ORDER — NITROGLYCERIN IN D5W 200-5 MCG/ML-% IV SOLN
2.0000 ug/min | INTRAVENOUS | Status: DC
  Administered 2013-10-26: 10 ug/min via INTRAVENOUS

## 2013-10-26 MED ORDER — TIROFIBAN HCL IV 5 MG/100ML
INTRAVENOUS | Status: AC
  Filled 2013-10-26: qty 100

## 2013-10-26 MED ORDER — NITROGLYCERIN 0.2 MG/ML ON CALL CATH LAB
INTRAVENOUS | Status: AC
  Filled 2013-10-26: qty 1

## 2013-10-26 MED ORDER — PERFLUTREN LIPID MICROSPHERE
1.0000 mL | INTRAVENOUS | Status: AC | PRN
  Administered 2013-10-26: 1.5 mL via INTRAVENOUS

## 2013-10-26 MED ORDER — AMLODIPINE BESYLATE 5 MG PO TABS
5.0000 mg | ORAL_TABLET | Freq: Every day | ORAL | Status: DC
  Administered 2013-10-27 – 2013-10-29 (×3): 5 mg via ORAL
  Filled 2013-10-26 (×3): qty 1

## 2013-10-26 MED ORDER — OXYCODONE-ACETAMINOPHEN 5-325 MG PO TABS
1.0000 | ORAL_TABLET | ORAL | Status: DC | PRN
  Administered 2013-10-26 – 2013-10-27 (×3): 2 via ORAL
  Filled 2013-10-26 (×4): qty 2

## 2013-10-26 MED ORDER — HEPARIN SODIUM (PORCINE) 1000 UNIT/ML IJ SOLN
INTRAMUSCULAR | Status: AC
  Filled 2013-10-26: qty 1

## 2013-10-26 MED ORDER — LEVOTHYROXINE SODIUM 75 MCG PO TABS
75.0000 ug | ORAL_TABLET | Freq: Every day | ORAL | Status: DC
  Administered 2013-10-27 – 2013-10-29 (×3): 75 ug via ORAL
  Filled 2013-10-26 (×4): qty 1

## 2013-10-26 MED ORDER — LOSARTAN POTASSIUM 50 MG PO TABS
50.0000 mg | ORAL_TABLET | Freq: Two times a day (BID) | ORAL | Status: DC
  Administered 2013-10-26 – 2013-10-27 (×3): 50 mg via ORAL
  Filled 2013-10-26 (×5): qty 1

## 2013-10-26 MED ORDER — INSULIN ASPART 100 UNIT/ML ~~LOC~~ SOLN
0.0000 [IU] | Freq: Three times a day (TID) | SUBCUTANEOUS | Status: DC
Start: 2013-10-26 — End: 2013-10-28
  Administered 2013-10-26: 5 [IU] via SUBCUTANEOUS
  Administered 2013-10-27 – 2013-10-28 (×4): 8 [IU] via SUBCUTANEOUS

## 2013-10-26 MED ORDER — CLOPIDOGREL BISULFATE 75 MG PO TABS
75.0000 mg | ORAL_TABLET | Freq: Every day | ORAL | Status: DC
  Administered 2013-10-27 – 2013-10-29 (×3): 75 mg via ORAL
  Filled 2013-10-26 (×4): qty 1

## 2013-10-26 MED ORDER — CARISOPRODOL 350 MG PO TABS
350.0000 mg | ORAL_TABLET | Freq: Four times a day (QID) | ORAL | Status: DC
  Administered 2013-10-26 – 2013-10-29 (×10): 350 mg via ORAL
  Filled 2013-10-26 (×10): qty 1

## 2013-10-26 MED ORDER — ATORVASTATIN CALCIUM 80 MG PO TABS
80.0000 mg | ORAL_TABLET | Freq: Every day | ORAL | Status: DC
Start: 2013-10-26 — End: 2013-10-29
  Administered 2013-10-26 – 2013-10-28 (×3): 80 mg via ORAL
  Filled 2013-10-26 (×5): qty 1

## 2013-10-26 MED ORDER — BIVALIRUDIN 250 MG IV SOLR
INTRAVENOUS | Status: AC
  Filled 2013-10-26: qty 250

## 2013-10-26 MED ORDER — INSULIN LISPRO 100 UNIT/ML (KWIKPEN): 40.0000 [IU] | PEN_INJECTOR | Freq: Three times a day (TID) | SUBCUTANEOUS | Status: DC

## 2013-10-26 MED ORDER — HEPARIN (PORCINE) IN NACL 2-0.9 UNIT/ML-% IJ SOLN
INTRAMUSCULAR | Status: AC
  Filled 2013-10-26: qty 1500

## 2013-10-26 MED ORDER — ASPIRIN EC 81 MG PO TBEC
81.0000 mg | DELAYED_RELEASE_TABLET | Freq: Every day | ORAL | Status: DC
Start: 2013-10-27 — End: 2013-10-29
  Administered 2013-10-27 – 2013-10-29 (×3): 81 mg via ORAL
  Filled 2013-10-26 (×3): qty 1

## 2013-10-26 MED ORDER — METOCLOPRAMIDE HCL 10 MG PO TABS
10.0000 mg | ORAL_TABLET | Freq: Four times a day (QID) | ORAL | Status: DC
Start: 2013-10-26 — End: 2013-10-29
  Administered 2013-10-26 – 2013-10-29 (×9): 10 mg via ORAL
  Filled 2013-10-26 (×14): qty 1

## 2013-10-26 MED ORDER — CARVEDILOL 3.125 MG PO TABS
3.1250 mg | ORAL_TABLET | Freq: Two times a day (BID) | ORAL | Status: DC
  Administered 2013-10-26 – 2013-10-27 (×2): 3.125 mg via ORAL
  Filled 2013-10-26 (×4): qty 1

## 2013-10-26 MED ORDER — MONTELUKAST SODIUM 10 MG PO TABS
10.0000 mg | ORAL_TABLET | Freq: Every day | ORAL | Status: DC
  Administered 2013-10-26 – 2013-10-28 (×3): 10 mg via ORAL
  Filled 2013-10-26 (×4): qty 1

## 2013-10-26 MED ORDER — INSULIN GLARGINE 100 UNIT/ML ~~LOC~~ SOLN: 100.0000 [IU] | Freq: Every day | SUBCUTANEOUS | Status: DC

## 2013-10-26 MED ORDER — ASPIRIN 81 MG PO CHEW: 81.0000 mg | CHEWABLE_TABLET | Freq: Every day | ORAL | Status: DC

## 2013-10-26 MED ORDER — PANTOPRAZOLE SODIUM 40 MG PO TBEC
40.0000 mg | DELAYED_RELEASE_TABLET | Freq: Every day | ORAL | Status: DC
  Administered 2013-10-27 – 2013-10-29 (×3): 40 mg via ORAL
  Filled 2013-10-26 (×4): qty 1

## 2013-10-26 MED ORDER — CLOPIDOGREL BISULFATE 300 MG PO TABS
ORAL_TABLET | ORAL | Status: AC
  Administered 2013-10-27: 75 mg via ORAL
  Filled 2013-10-26: qty 1

## 2013-10-26 MED ORDER — CLOPIDOGREL BISULFATE 300 MG PO TABS
ORAL_TABLET | ORAL | Status: AC
  Filled 2013-10-26: qty 1

## 2013-10-26 MED ORDER — SODIUM CHLORIDE 0.9 % IJ SOLN: 3.0000 mL | INTRAMUSCULAR | Status: DC | PRN

## 2013-10-26 NOTE — H&P (Signed)
Patient ID: Danielle Vargas MRN: [id number], DOB/AGE: [birth year]   Admit date: 10/26/2013  Primary Physician: Marton Redwood, MD Primary Cardiologist: new to   - H. Tamala Julian, MD   Pt. Profile:  63 y/o female without prior cardiac hx who presented today with chest pain and acute lateral ST elevation.  Problem List  Past Medical History  Diagnosis Date  . DM (diabetes mellitus)   . Obesity   . Dyslipidemia   . HTN (hypertension)   . Hypothyroidism   . Gastroparesis   . Migraine   . Osteoporosis   . Colon polyps 7/07    hyperplastic and adenomatous  . Depression   . Nephrolithiasis   . Diverticulosis   . External hemorrhoid   . Fatty liver   . Asthma   . CAD (coronary artery disease)     a. 12/2008 Cath: mild irregs throughout, EF 75%.    Past Surgical History  Procedure Laterality Date  . Tubal ligation  '85  . Breast reduction surgery  '88  . Cervical disc surgery  '98    fusion  . Carpal tunnel release  2000    right wrist  . Rotator cuff repair  '09    left shoulder  . Ulnar nerve repair  '02, '08    left done, then right  . Cesarean section  '79  '84     Allergies  Allergies  Allergen Reactions  . Invokana [Canagliflozin] Diarrhea  . Nsaids Other (See Comments)    GI problems   HPI  63 year old female without prior cardiac history. She does have risk factors including hypertension, obesity, diabetes mellitus, hyperlipidemia, and family history of coronary disease. She was in her usual state of health until approximately noontime today when she developed severe substernal chest discomfort. She called EMS and was found to have acute lateral ST segment elevation. She was treated with nitroglycerin with some relief. She continues to have 5/10 chest discomfort. A code STEMI was called in route to [facility] and she is currently undergoing diagnostic catheterization.  Home Medications  Prior to Admission medications   Medication Sig Start Date End Date  Taking? Authorizing Provider  albuterol (PROVENTIL HFA;VENTOLIN HFA) 108 (90 BASE) MCG/ACT inhaler Inhale 2 puffs into the lungs every 6 (six) hours as needed. For shortness of breath.    Historical Provider, MD  amLODipine (NORVASC) 5 MG tablet Take 5 mg by mouth daily.  06/08/12   Historical Provider, MD  benzonatate (TESSALON) 100 MG capsule Take 1 capsule (100 mg total) by mouth every 8 (eight) hours. 09/17/13   Neta Ehlers, MD  budesonide-formoterol (SYMBICORT) 80-4.5 MCG/ACT inhaler Inhale 2 puffs into the lungs 2 (two) times daily.    Historical Provider, MD  calcium-vitamin D (CALCIUM 500/D) 500-200 MG-UNIT per tablet Take 2 tablets by mouth daily.    Historical Provider, MD  carisoprodol (SOMA) 350 MG tablet Take 350 mg by mouth 4 (four) times daily.     Historical Provider, MD  Coenzyme Q10 (CO Q 10) 100 MG CAPS Take 2 tablets by mouth daily.    Historical Provider, MD  fenofibrate 160 MG tablet Take 160 mg by mouth at bedtime.     Historical Provider, MD  gabapentin (NEURONTIN) 300 MG capsule Take 300 mg by mouth 3 (three) times daily.    Historical Provider, MD  hydrochlorothiazide (MICROZIDE) 12.5 MG capsule Take 12.5 mg by mouth every morning.    Historical Provider, MD  HYDROcodone-acetaminophen (NORCO) 5-325 MG per tablet  Take 1 tablet by mouth every 6 (six) hours as needed. For joint and back pain.    Historical Provider, MD  insulin glargine (LANTUS) 100 UNIT/ML injection Inject 1 mL (100 Units total) into the skin at bedtime. 10/10/13   Philemon Kingdom, MD  insulin lispro (HUMALOG KWIKPEN) 100 UNIT/ML KiwkPen Inject up to 120 units daily as advised 10/10/13   Philemon Kingdom, MD  Insulin Pen Needle (CLICKFINE PEN NEEDLES) 31G X 8 MM MISC Use 3x a day 08/08/13   Philemon Kingdom, MD  Insulin Syringe-Needle U-100 (BD INSULIN SYRINGE ULTRAFINE) 31G X 5/16" 1 ML MISC Use 5x a day as advised 10/10/13   Philemon Kingdom, MD  Insulin Syringe-Needle U-100 (INSULIN SYRINGE 1CC/31GX5/16") 31G  X 5/16" 1 ML MISC Use to inject insulin 3 times daily as instructed. 09/28/13   Elayne Snare, MD  levothyroxine (SYNTHROID, LEVOTHROID) 75 MCG tablet Take 75 mcg by mouth daily before breakfast.     Historical Provider, MD  losartan (COZAAR) 50 MG tablet Take 50 mg by mouth 2 (two) times daily.     Historical Provider, MD  metFORMIN (GLUCOPHAGE) 1000 MG tablet Take 1,000 mg by mouth 2 (two) times daily with a meal.    Historical Provider, MD  metoCLOPramide (REGLAN) 10 MG tablet Take 10 mg by mouth 4 (four) times daily.     Historical Provider, MD  montelukast (SINGULAIR) 10 MG tablet Take 10 mg by mouth at bedtime.    Historical Provider, MD  omeprazole (PRILOSEC) 20 MG capsule Take 20 mg by mouth 2 (two) times daily.     Historical Provider, MD  Syringe, Disposable, (BD TUBERCULIN SYRINGE) 1 ML MISC Use 3x a day 08/08/13   Philemon Kingdom, MD  topiramate (TOPAMAX) 50 MG tablet Take 50 mg by mouth 2 (two) times daily.    Historical Provider, MD  Vitamin D, Ergocalciferol, (DRISDOL) 50000 UNITS CAPS Take 50,000 Units by mouth every 7 (seven) days. Take on thursdays    Historical Provider, MD    Family History  Family History  Problem Relation Age of Onset  . COPD Father   . Stroke Father   . Heart failure Brother   . Heart failure Mother   . Hypertension Brother   . Heart attack Mother     several  . Colon cancer      mat. 1st cousin  . Other      celiac sprue, 1/2 brother  . Diabetes Mother   . Diabetes Brother     x 3  . Lung cancer Brother   . Cirrhosis Sister     liver transplant  . Kidney disease Mother   . Emphysema Father     Social History  History   Social History  . Marital Status: Divorced    Spouse Name: N/A    Number of Children: 2  . Years of Education: N/A   Occupational History  . disabled    Social History Main Topics  . Smoking status: Never Smoker   . Smokeless tobacco: Never Used  . Alcohol Use: No  . Drug Use: No  . Sexual Activity: Not on file    Other Topics Concern  . Not on file   Social History Narrative   Lives in [place].     Regular exercise: none   Caffeine use: daily; dt coke           Review of Systems General:  No chills, fever, night sweats or weight changes.  Cardiovascular:  Positive  chest pain as outlined above, no dyspnea on exertion, edema, orthopnea, palpitations, paroxysmal nocturnal dyspnea. Dermatological: No rash, lesions/masses Respiratory: No cough, dyspnea Urologic: No hematuria, dysuria Abdominal:   She reports a history of constipation for which she takes MiraLAX. No nausea, vomiting, diarrhea, bright red blood per rectum, melena, or hematemesis Neurologic:  No visual changes, wkns, changes in mental status. All other systems reviewed and are otherwise negative except as noted above.  Physical Exam  There were no vitals taken for this visit.  General: Pleasant, NAD Psych: Normal affect. Neuro: Alert and oriented X 3. Moves all extremities spontaneously. HEENT: Normal  Neck: Supple without bruits or JVD. Lungs:  Resp regular and unlabored, CTA. Heart: RRR no s3, s4, or murmurs. Abdomen: Soft, non-tender, non-distended, BS + x 4.  Extremities: No clubbing, cyanosis or edema. DP/PT/Radials 2+ and equal bilaterally.  Labs  All labs pending   Radiology/Studies  No results found.  ECG  Sinus tachycardia, 133 2 mm ST segment elevation in 1, 2, aVL, V4 through V6  ASSESSMENT AND PLAN  1. Acute lateral ST segment elevation myocardial infarction: Patient is undergoing emergent cardiac catheterization. Further recommendations following angiography. Plan to institute aspirin, beta blocker, statin therapy. Eventual cardiac rehabilitation.  2. Hypertension: Currently stable. Continue ARB therapy. Follow with addition of beta blocker.  3. Diabetes mellitus: Hold metformin. Continue Lantus. Add sliding scale insulin.  4. Hyperlipidemia: Check lipids and LFTs. Add high potency statin.  5.   Hypothyroidism:  Cont replacement.  Signed, Murray Hodgkins, NP 10/26/2013, 1:51 PM  The patient was interviewed and examined under emergency circumstances. There is a family history of premature atherosclerosis. The ECG is compatible with acute lateral wall injury.She has a history of wheezing and may not tolerate beta blocker therapy. The plan is emergency coronary angiography and mechanical reperfusion if possible. Benefits and risks of the procedure were discussed in detail and accepted by the patient. Risks including death, MI, CABG, stroke, allergy, kidney injury, bleeding among others were detailed.  The content of the above note and plan is accurate as outlined.

## 2013-10-26 NOTE — Progress Notes (Signed)
Utilization Review Completed.Donne Anon T2/18/2015

## 2013-10-26 NOTE — CV Procedure (Signed)
Emergency Left Heart Catheterization with Coronary Angiography and PCI Report  Danielle Vargas  63 y.o.  female 12/24/1950  Procedure Date: 10/26/2013  Referring Physician: [facility]  Primary Cardiologist: Helyn Numbers, M.D.  INDICATIONS: Acute lateral wall STEMI  PROCEDURE: 1. Left heart catheterization; 2. Coronary angiography; 3. Left ventriculography; 4. PTCA obtuse marginal #1  CONSENT:  The risks, benefits, and details of the procedure were explained in detail to the patient. Risks including death, stroke, heart attack, kidney injury, allergy, limb ischemia, bleeding and radiation injury were discussed.  The patient verbalized understanding and wanted to proceed.  Informed written consent was obtained.  PROCEDURE TECHNIQUE:  After Xylocaine anesthesia a 5 French Slender sheath was placed in the right radial artery with an Angiocath using an anterior and posterior wall stick.  3 mg of intra-arterial verapamil was administered through the sheath. Coronary angiography was done using a 5 [place] JR 4 catheter. This catheter was used for right coronary angiography and left ventriculography by hand injection. We were unable to advance a 5 [place] JL 3.5 cm diagnostic catheter due to intense radial/brachial artery spasm. 400 mcg of intra-arterial nitroglycerin was administered. An additional 3 mg of intra-arterial verapamil was administered. We waited an additional several minutes and were still unable to advance a 5 [place] diagnostic or 5 [place] guide catheter beyond the mid brachial artery. Because of significant discomfort with catheter manipulation and with time of occlusion progressing, we decided to abort the radial approach and switched over to the right femoral approach. IV heparin 4000 units was administered prior to entering the radial artery to  The right femoral was very deep and difficult to palpate. The patient is markedly obese. After several minutes we were able to hit  the right femoral artery with a anterior wall stick. A 6 French sheath was placed and left coronary angiography was then performed with a 6 French 3 cm XB LAD guide catheter. The culprit vessel was identified as a mid to distal occlusion of the first obtuse marginal. Bivalirudin bolus and infusion was started. A CT was documented to be greater than 300. An aside he [place] guidewire was then used to cross the total occlusion in the mid to distal obtuse marginal #1. Angioplasty using a 2.0 x 12 mm long balloon was performed x2 at low pressure. This reestablished flow. We noted that a small superior branch beyond the culprit total occlusion. This appeared to be a very small branch. We will unable to successfully advance a wire to a distal position. We therefore did not intervene upon this branch of the first obtuse marginal. With the angioplasty that was performed, TIMI grade 3 flow was noted and a moderate sized distal vessel was noted beyond the region of angioplasty.  Intracoronary nitroglycerin was administered. The angioplasty site remains stable.  The guide catheter was removed. Hemostasis was achieved with Angio-Seal left angiographic documentation of appropriate anatomy.  MEDICATIONS: 2 mg IV Versed and 100 mcg of fentanyl     CONTRAST:  Total of 175 cc.  COMPLICATIONS:  Prolonged time to reperfusion do to radial artery spasm that forced [place] to abort the radial approach. This was followed by prolonged time required to gain access in the right femoral artery due to the patient's obesity.   HEMODYNAMICS:  Aortic pressure  175/103 mmHg ; LV pressure  175/18 mmHg ; LVEDP  23 mm mercury  ANGIOGRAPHIC DATA:   The left main coronary artery is short but widely  patent .  The left anterior descending artery is  widely patent and transapical. The proximal vessel contains moderate calcification and eccentric 30-40% stenosis to a small diagonal branches arise from the LAD. The LAD and diagonal branches  are free of any significant obstruction.  The left circumflex artery is  a large caliber vessel. 2 obtuse marginals arise from this vessel. The first obtuse marginal is totally occluded in its distal half. The remainder of the circumflex is widely patent .  The right coronary artery is dominant and widely patent without any significant obstruction.   PCI RESULTS:  Angioplasty of the distal OM 1 segment reduced C1 100% stenosis to 0% with TIMI grade 3 flow. A small branch of this artery remained occluded and was seen to fill by contrast staining but did not have good antegrade flow to   LEFT VENTRICULOGRAM:  Left ventricular angiogram was done in the 30 RAO projection and revealed  apical severe hypokinesis/akinesis with an estimated ejection fraction of 45-50%    IMPRESSIONS:  1. Acute lateral wall ST elevation myocardial infarction do to acute occlusion of the distal half of the first obtuse marginal. This could have been related to spontaneous coronary artery dissection.    2. Successful angioplasty of the first obtuse marginal with reestablishment of flow to the great majority of the distal bed in question.  3. Widely patent circumflex, RCA, and LAD. The LAD contains proximal calcification and less than 50% stenosis.  4. Left ventricular dysfunction with severe inferoapical hypokinesis  RECOMMENDATION:   Angio-Seal was performed in the right femoral. Hopefully this will decrease the risk of hemorrhage.  A wrist band was used for hemostasis in the right radial.  Angiomax was discontinued in the cath lab. A bolus followed by an infusion of Aggrastat was started.  Plavix 600 mg was loaded and should be continued at 75 mg daily for at least one year in conjunction with aspirin 81 mg daily.  The patient should be a fast track STEMI patient with discharge possibly occurring within 72 hours assuming no bleeding complications.Marland Kitchen

## 2013-10-26 NOTE — Progress Notes (Signed)
  Echocardiogram 2D Echocardiogram has been performed.  Mauricio Po 10/26/2013, 5:36 PM

## 2013-10-26 NOTE — Progress Notes (Signed)
ANTICOAGULATION CONSULT NOTE - Initial Consult  Pharmacy Consult for aggrastat Indication: chest pain/ACS  Allergies  Allergen Reactions  . Invokana [Canagliflozin] Diarrhea  . Nsaids Other (See Comments)    GI problems    Patient Measurements: Height: 5' 1.02" (155 cm) Weight: 212 lb 1.3 oz (96.2 kg) IBW/kg (Calculated) : 47.86   Vital Signs: Pulse Rate: 132 (02/18 1400)  Labs:  Recent Labs  10/26/13 1400 10/26/13 1422  HGB 11.7* 13.3  HCT 36.4 39.0  PLT 340  --   APTT >200*  --   LABPROT 61.4*  --   INR 7.60*  --   CREATININE 1.18* 1.20*  CKTOTAL 176  --   CKMB 10.6*  --   TROPONINI 2.52*  --     Estimated Creatinine Clearance: 52.2 ml/min (by C-G formula based on Cr of 1.2).   Medical History: Past Medical History  Diagnosis Date  . DM (diabetes mellitus)   . Obesity   . Dyslipidemia   . HTN (hypertension)   . Hypothyroidism   . Gastroparesis   . Migraine   . Osteoporosis   . Colon polyps 7/07    hyperplastic and adenomatous  . Depression   . Nephrolithiasis   . Diverticulosis   . External hemorrhoid   . Fatty liver   . Asthma   . CAD (coronary artery disease)     a. 12/2008 Cath: mild irregs throughout, EF 75%.    Assessment: 63 year old female admitted as urgent STEMI. LHC with PCI to OM. Loaded with plavix, angiomax, and started on aggrastat. CBC appears within normal limit. No bleeding complications from cath noted. Calculated crcl ~75, will continue aggrastat x18h at normal dose.  Goal of Therapy:   Monitor platelets by anticoagulation protocol: Yes   Plan:  Aggrastat 0.15mg /kg/min x 18h CBC in am Georgina Peer 10/26/2013,4:09 PM

## 2013-10-27 DIAGNOSIS — I219 Acute myocardial infarction, unspecified: Secondary | ICD-10-CM

## 2013-10-27 LAB — GLUCOSE, CAPILLARY
GLUCOSE-CAPILLARY: 278 mg/dL — AB (ref 70–99)
Glucose-Capillary: 295 mg/dL — ABNORMAL HIGH (ref 70–99)
Glucose-Capillary: 270 mg/dL — ABNORMAL HIGH (ref 70–99)
Glucose-Capillary: 299 mg/dL — ABNORMAL HIGH (ref 70–99)

## 2013-10-27 LAB — PROTIME-INR
INR: 7.6 — AB (ref 0.00–1.49)
PROTHROMBIN TIME: 61.4 s — AB (ref 11.6–15.2)

## 2013-10-27 LAB — LIPID PANEL
CHOL/HDL RATIO: 5.5 ratio
CHOLESTEROL: 115 mg/dL (ref 0–200)
HDL: 21 mg/dL — ABNORMAL LOW (ref >39)
LDL CALC: 47 mg/dL (ref 0–99)
Triglycerides: 236 mg/dL — ABNORMAL HIGH (ref <150)
VLDL: 47 mg/dL — ABNORMAL HIGH (ref 0–40)

## 2013-10-27 LAB — BASIC METABOLIC PANEL
BUN: 17 mg/dL (ref 6–23)
CO2: 20 meq/L (ref 19–32)
Calcium: 7.9 mg/dL — ABNORMAL LOW (ref 8.4–10.5)
Chloride: 97 mEq/L (ref 96–112)
Creatinine, Ser: 0.93 mg/dL (ref 0.50–1.10)
GFR calc Af Amer: 75 mL/min — ABNORMAL LOW (ref >90)
GFR calc non Af Amer: 65 mL/min — ABNORMAL LOW (ref >90)
Glucose, Bld: 259 mg/dL — ABNORMAL HIGH (ref 70–99)
Potassium: 4.1 mEq/L (ref 3.7–5.3)
SODIUM: 132 meq/L — AB (ref 137–147)

## 2013-10-27 LAB — CBC
HCT: 32.6 % — ABNORMAL LOW (ref 36.0–46.0)
Hemoglobin: 10.4 g/dL — ABNORMAL LOW (ref 12.0–15.0)
MCH: 27.7 pg (ref 26.0–34.0)
MCHC: 31.9 g/dL (ref 30.0–36.0)
MCV: 86.9 fL (ref 78.0–100.0)
PLATELETS: 318 10*3/uL (ref 150–400)
RBC: 3.75 MIL/uL — AB (ref 3.87–5.11)
RDW: 16.1 % — ABNORMAL HIGH (ref 11.5–15.5)
WBC: 10.3 10*3/uL (ref 4.0–10.5)

## 2013-10-27 LAB — PRO B NATRIURETIC PEPTIDE: Pro B Natriuretic peptide (BNP): 2254 pg/mL — ABNORMAL HIGH (ref 0–125)

## 2013-10-27 LAB — TROPONIN I

## 2013-10-27 LAB — TSH: TSH: 1.904 u[IU]/mL (ref 0.350–4.500)

## 2013-10-27 LAB — APTT: aPTT: >200 seconds (ref 24–37)

## 2013-10-27 MED ORDER — FUROSEMIDE 10 MG/ML IJ SOLN
40.0000 mg | Freq: Once | INTRAMUSCULAR | Status: AC
  Administered 2013-10-27: 40 mg via INTRAVENOUS
  Filled 2013-10-27: qty 4

## 2013-10-27 MED ORDER — FUROSEMIDE 40 MG PO TABS
40.0000 mg | ORAL_TABLET | Freq: Every day | ORAL | Status: DC
  Administered 2013-10-28 – 2013-10-29 (×2): 40 mg via ORAL
  Filled 2013-10-27 (×2): qty 1

## 2013-10-27 MED ORDER — CARVEDILOL 12.5 MG PO TABS
12.5000 mg | ORAL_TABLET | Freq: Two times a day (BID) | ORAL | Status: DC
  Administered 2013-10-27 – 2013-10-29 (×4): 12.5 mg via ORAL
  Filled 2013-10-27 (×7): qty 1

## 2013-10-27 MED ORDER — POLYETHYLENE GLYCOL 3350 17 G PO PACK
17.0000 g | PACK | Freq: Every day | ORAL | Status: DC
  Administered 2013-10-27 – 2013-10-28 (×2): 17 g via ORAL
  Filled 2013-10-27 (×3): qty 1

## 2013-10-27 MED FILL — Sodium Chloride IV Soln 0.9%: INTRAVENOUS | Qty: 50 | Status: AC

## 2013-10-27 NOTE — Progress Notes (Addendum)
Inpatient Diabetes Program Recommendations  AACE/ADA: New Consensus Statement on Inpatient Glycemic Control (2013)  Target Ranges:  Prepandial:   less than 140 mg/dL      Peak postprandial:   less than 180 mg/dL (1-2 hours)      Critically ill patients:  140 - 180 mg/dL  Results for Danielle Vargas, Danielle Vargas (MRN [id number]) as of 10/27/2013 09:22  Ref. Range 10/26/2013 15:56 10/26/2013 21:13 10/27/2013 07:40  Glucose-Capillary Latest Range: 70-99 mg/dL 238 (H) 183 (H) 295 (H)   Per Dr.Cristina Gherghe-Endocrinologist note 10/10/13, patient is supposed to be taking: Lantus 100 units at night  - Humalog mealtime insulin:  Breakfast: 40 units  Lunch: 40 units  Dinner: 40 units Please add a portion of patient's home dose Lantus. Might start low with Lantus 25 units and adjust as needed. Will follow. Thank you  Raoul Pitch BSN, RN,CDE Inpatient Diabetes Coordinator [phone] (team pager)

## 2013-10-27 NOTE — Progress Notes (Signed)
Checking up on patient after lateral wall MI yesterday. In reviewing her history she has had dyspnea since August. More dyspnea on exertion than orthopnea or PND.  LV dysfunction is disproportionate to the myocardium served beyond the area of total occlusion in the circumflex territory. 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  BNP is greater than 2500.  No chest x-ray.   I will get one dose of IV Lasix now and start oral furosemide 40 mg per day tomorrow a.m.

## 2013-10-27 NOTE — Progress Notes (Signed)
TELEMETRY: Reviewed telemetry pt in NSR: Filed Vitals:   10/27/13 0600 10/27/13 0700 10/27/13 0741 10/27/13 1000  BP: 134/62 141/75 141/75 134/63  Pulse: 103 103 102   Temp:   97.8 F (36.6 C)   TempSrc:   Oral   Resp:   22   Height:      Weight:      SpO2:   94%     Intake/Output Summary (Last 24 hours) at 10/27/13 1221 Last data filed at 10/27/13 1134  Gross per 24 hour  Intake 2288.58 ml  Output   2950 ml  Net -661.42 ml    SUBJECTIVE Still has some chest "soreness". Much better than yesterday. No SOB.  LABS: Basic Metabolic Panel:  Recent Labs  10/26/13 1710 10/27/13 0307  NA 130* 132*  K 4.9 4.1  CL 97 97  CO2 17* 20  GLUCOSE 228* 259*  BUN 19 17  CREATININE 1.03 0.93  CALCIUM 8.9 7.9*   Liver Function Tests:  Recent Labs  10/26/13 1400 10/26/13 1710  AST 41* 82*  ALT 26 29  ALKPHOS 50 47  BILITOT 0.3 0.4  PROT 7.7 7.3  ALBUMIN 3.8 3.5   No results found for this basename: LIPASE, AMYLASE,  in the last 72 hours CBC:  Recent Labs  10/26/13 1710 10/26/13 2140 10/27/13 0307  WBC 12.3*  --  10.3  NEUTROABS 9.7*  --   --   HGB 11.3*  --  10.4*  HCT 35.0*  --  32.6*  MCV 86.4  --  86.9  PLT 335 319 318   Cardiac Enzymes:  Recent Labs  10/26/13 1400 10/26/13 1710 10/26/13 2251 10/27/13 0307  CKTOTAL 176  --   --   --   CKMB 10.6*  --   --   --   TROPONINI 2.52* 14.20* >20.00* >20.00*   BNP: 2254 D-Dimer: No results found for this basename: DDIMER,  in the last 72 hours Hemoglobin A1C:  Recent Labs  10/26/13 1400  HGBA1C 7.1*   Fasting Lipid Panel:  Recent Labs  10/27/13 0307  CHOL 115  HDL 21*  LDLCALC 47  TRIG 236*  CHOLHDL 5.5   Thyroid Function Tests:  Recent Labs  10/26/13 1710  TSH 1.904   Anemia Panel: No results found for this basename: VITAMINB12, FOLATE, FERRITIN, TIBC, IRON, RETICCTPCT,  in the last 72 hours  Radiology/Studies:  No results found.  Ecg: NSR LAD, poor R wave  progression  PHYSICAL EXAM General: Well developed, obese, in no acute distress. Head: Normocephalic, atraumatic, sclera non-icteric, no xanthomas, nares are without discharge. Neck: Negative for carotid bruits. JVD not elevated. Lungs: Clear bilaterally to auscultation without wheezes, rales, or rhonchi. Breathing is unlabored. Heart: RRR S1 S2 without murmurs, rubs, or gallops.  Abdomen: Soft, non-tender, non-distended with normoactive bowel sounds. No hepatomegaly. No rebound/guarding. No obvious abdominal masses. Msk:  Strength and tone appears normal for age. Extremities: No clubbing, cyanosis or edema.  Distal pedal pulses are 2+ and equal bilaterally. No hematoma in right groin or right wrist. Neuro: Alert and oriented X 3. Moves all extremities spontaneously. Psych:  Responds to questions appropriately with a normal affect.  ASSESSMENT AND PLAN: 1. Lateral STEMI s/p PTCA of the OM branch. Clinically improved. Ecg is better. Troponin peak >20. Continue ASA and Plavix. Adjust beta blocker. 2. Ischemic LV dysfunction. EF 35-40%. Continue ARB. Adjust beta blocker as tolerated. 3. Hyperlipidemia on high dose statin 4. DM. On Lantus and SSi. Resume metformin  on DC 5. Hypothyroidism. 6. Obesity.  Will transfer to telemetry today. Anticipate DC Saturday if no complications. Ambulate with cardiac Rehab.    Active Problems:   Acute ST elevation myocardial infarction (STEMI)   ST elevation myocardial infarction (STEMI) involving left circumflex coronary artery with complication    Signed, Peter Martinique MD,FACC 10/27/2013 12:28 PM

## 2013-10-27 NOTE — Progress Notes (Signed)
CARDIAC REHAB PHASE I   PRE:  Rate/Rhythm: 102ST  BP:  Supine:   Sitting: 134/63  Standing:    SaO2: 95%RA  MODE:  Ambulation: 170 ft   POST:  Rate/Rhythm: 113 ST  BP:  Supine:   Sitting: 147/61  Standing:    SaO2: 95%RA 1000-1055 Assisted pt from bathroom to recliner. Pt stated felt good to walk a little. Took pt on short walk since troponin still elevated. Pt walked 170 ft on RA with asst x 1 with steady gait. Tired by end of walk and a little SOB. Sats good on RA. To recliner for about 30 minutes but pt's back started to hurt in recliner. Requested to go back to bed. Assisted to bed and notified pt's RN of back pain. No CP with walk. MI ed completed. Reviewed restrictions, heart healthy and diabetic diets encouraging pt to count carbs, NTG use and calling 911, risk factors. Discussed CRP 2 with pt as pt states limited in exercise due to orthopedic issues. Think program could help pt find something that she could do daily to help with weight loss and to get HDL higher. Pt agreed to referral to [facility]. Will complete ed tomorrow and try to increase distance.   Graylon Good, RN BSN  10/27/2013 10:49 AM

## 2013-10-28 ENCOUNTER — Inpatient Hospital Stay (HOSPITAL_COMMUNITY): Payer: Medicare Other

## 2013-10-28 DIAGNOSIS — Z9861 Coronary angioplasty status: Secondary | ICD-10-CM

## 2013-10-28 LAB — BASIC METABOLIC PANEL
BUN: 24 mg/dL — ABNORMAL HIGH (ref 6–23)
BUN: 30 mg/dL — ABNORMAL HIGH (ref 6–23)
CALCIUM: 8.8 mg/dL (ref 8.4–10.5)
CO2: 23 meq/L (ref 19–32)
CO2: 19 meq/L (ref 19–32)
Calcium: 9.3 mg/dL (ref 8.4–10.5)
Chloride: 100 mEq/L (ref 96–112)
Chloride: 93 mEq/L — ABNORMAL LOW (ref 96–112)
Creatinine, Ser: 1.42 mg/dL — ABNORMAL HIGH (ref 0.50–1.10)
Creatinine, Ser: 1.25 mg/dL — ABNORMAL HIGH (ref 0.50–1.10)
GFR calc Af Amer: 45 mL/min — ABNORMAL LOW (ref >90)
GFR calc Af Amer: 53 mL/min — ABNORMAL LOW (ref >90)
GFR calc non Af Amer: 39 mL/min — ABNORMAL LOW (ref >90)
GFR calc non Af Amer: 45 mL/min — ABNORMAL LOW (ref >90)
GLUCOSE: 303 mg/dL — AB (ref 70–99)
GLUCOSE: 375 mg/dL — AB (ref 70–99)
POTASSIUM: 5.3 meq/L (ref 3.7–5.3)
Potassium: 4.7 mEq/L (ref 3.7–5.3)
Sodium: 136 mEq/L — ABNORMAL LOW (ref 137–147)
Sodium: 128 mEq/L — ABNORMAL LOW (ref 137–147)

## 2013-10-28 LAB — GLUCOSE, CAPILLARY
GLUCOSE-CAPILLARY: 263 mg/dL — AB (ref 70–99)
GLUCOSE-CAPILLARY: 335 mg/dL — AB (ref 70–99)
Glucose-Capillary: 348 mg/dL — ABNORMAL HIGH (ref 70–99)
Glucose-Capillary: 305 mg/dL — ABNORMAL HIGH (ref 70–99)
Glucose-Capillary: 340 mg/dL — ABNORMAL HIGH (ref 70–99)

## 2013-10-28 LAB — TROPONIN I: Troponin I: 12.83 ng/mL (ref <0.30)

## 2013-10-28 MED ORDER — INSULIN ASPART 100 UNIT/ML ~~LOC~~ SOLN
0.0000 [IU] | Freq: Three times a day (TID) | SUBCUTANEOUS | Status: DC
  Administered 2013-10-28: 11 [IU] via SUBCUTANEOUS
  Administered 2013-10-28 – 2013-10-29 (×2): 8 [IU] via SUBCUTANEOUS
  Administered 2013-10-29: 5 [IU] via SUBCUTANEOUS

## 2013-10-28 MED ORDER — INSULIN ASPART 100 UNIT/ML ~~LOC~~ SOLN
10.0000 [IU] | Freq: Three times a day (TID) | SUBCUTANEOUS | Status: DC
Start: 2013-10-28 — End: 2013-10-29
  Administered 2013-10-28 – 2013-10-29 (×4): 10 [IU] via SUBCUTANEOUS

## 2013-10-28 MED ORDER — INSULIN GLARGINE 100 UNIT/ML ~~LOC~~ SOLN: 50.0000 [IU] | Freq: Every day | SUBCUTANEOUS | Status: DC

## 2013-10-28 MED ORDER — INSULIN GLARGINE 100 UNIT/ML ~~LOC~~ SOLN
50.0000 [IU] | Freq: Every day | SUBCUTANEOUS | Status: DC
  Administered 2013-10-28: 50 [IU] via SUBCUTANEOUS
  Filled 2013-10-28 (×2): qty 0.5

## 2013-10-28 NOTE — Progress Notes (Signed)
CARDIAC REHAB PHASE I   PRE:  Rate/Rhythm: 92SR  BP:  Supine:   Sitting: 100/50  Standing:    SaO2: 98%RA  MODE:  Ambulation: 350 ft   POST:  Rate/Rhythm: 105ST  BP:  Supine:   Sitting: 112/52  Standing:    SaO2: 96%RA 1045-1130 Pt walked 350 ft independently with steady gait. Had to stop and rest once due to hip pain. No CP. RN to medicate for pain. Exercise ed completed. Pt voiced understanding.  Discussed importance of plavix. No questions from ed that was done yesterday.   Graylon Good, RN BSN  10/28/2013 11:26 AM

## 2013-10-28 NOTE — Progress Notes (Addendum)
       Patient Name: Danielle Vargas Date of Encounter: 10/28/2013    SUBJECTIVE:Feels much better. Breathing is much improved.  TELEMETRY:  Sinus tach: Filed Vitals:   10/27/13 2112 10/27/13 2207 10/28/13 0400 10/28/13 0607  BP: 104/54  102/45   Pulse: 104 100 90   Temp: 97.8 F (36.6 C)  98 F (36.7 C)   TempSrc: Oral  Oral   Resp: 18 18 18    Height:      Weight:    208 lb 12.4 oz (94.7 kg)  SpO2: 98% 96% 99%     Intake/Output Summary (Last 24 hours) at 10/28/13 0753 Last data filed at 10/27/13 1700  Gross per 24 hour  Intake 829.15 ml  Output   2400 ml  Net -1570.85 ml    LABS: Basic Metabolic Panel:  Recent Labs  10/27/13 0307 10/28/13 0320  NA 132* 136*  K 4.1 5.3  CL 97 100  CO2 20 23  GLUCOSE 259* 303*  BUN 17 24*  CREATININE 0.93 1.42*  CALCIUM 7.9* 9.3   CBC:  Recent Labs  10/26/13 1710 10/26/13 2140 10/27/13 0307  WBC 12.3*  --  10.3  NEUTROABS 9.7*  --   --   HGB 11.3*  --  10.4*  HCT 35.0*  --  32.6*  MCV 86.4  --  86.9  PLT 335 319 318   Cardiac Enzymes:  Recent Labs  10/26/13 1400  10/26/13 2251 10/27/13 0307 10/28/13 0320  CKTOTAL 176  --   --   --   --   CKMB 10.6*  --   --   --   --   TROPONINI 2.52*  < > >20.00* >20.00* 12.83*  < > = values in this interval not displayed. BNP: No components found with this basename: POCBNP,  Hemoglobin A1C:  Recent Labs  10/26/13 1400  HGBA1C 7.1*   Fasting Lipid Panel:  Recent Labs  10/27/13 0307  CHOL 115  HDL 21*  LDLCALC 47  TRIG 236*  CHOLHDL 5.5    Radiology/Studies:  pending  Physical Exam: Blood pressure 102/45, pulse 90, temperature 98 F (36.7 C), temperature source Oral, resp. rate 18, height 5\' 1"  (1.549 m), weight 208 lb 12.4 oz (94.7 kg), SpO2 99.00%. Weight change: -3 lb 4.9 oz (-1.5 kg)   Clear lungs Cardiac with tachycardia and no murmur  ASSESSMENT:  1. S/p Lateral stemi, treated with PCI 2. Acute combined systolic and diastolic HF, improved  with diuresis 3. Acute kidney injury due to diuresis, low output, and ACE  Plan:  1. Hold diuretic and ACE today. 2. Ambulate 3. Home in AM if stable renal function . 4. Will re-institute ACE when renal function stable, perhaps as OP 5. I believe she will need chronic diuretic therapy as OP  Signed, Sinclair Grooms 10/28/2013, 7:53 AM

## 2013-10-28 NOTE — Progress Notes (Signed)
Inpatient Diabetes Program Recommendations  AACE/ADA: New Consensus Statement on Inpatient Glycemic Control (2013)  Target Ranges:  Prepandial:   less than 140 mg/dL      Peak postprandial:   less than 180 mg/dL (1-2 hours)      Critically ill patients:  140 - 180 mg/dL   Reason for Visit: Results for Danielle Vargas, Danielle Vargas (MRN [id number]) as of 10/28/2013 11:49  Ref. Range 10/27/2013 07:40 10/27/2013 12:23 10/27/2013 16:29 10/27/2013 21:01 10/28/2013 06:26  Glucose-Capillary Latest Range: 70-99 mg/dL 295 (H) 270 (H) 299 (H) 278 (H) 263 (H)   Called and discussed insulin regimen with PA, Lorretta Harp.  Orders received for Lantus 50 units daily (1/2 home dose) and Novolog 10 units tid with meals.  Thanks, Adah Perl, RN, BC-ADM Inpatient Diabetes Coordinator Pager [phone]

## 2013-10-29 DIAGNOSIS — E119 Type 2 diabetes mellitus without complications: Secondary | ICD-10-CM

## 2013-10-29 DIAGNOSIS — I5032 Chronic diastolic (congestive) heart failure: Secondary | ICD-10-CM | POA: Diagnosis not present

## 2013-10-29 DIAGNOSIS — I5041 Acute combined systolic (congestive) and diastolic (congestive) heart failure: Secondary | ICD-10-CM

## 2013-10-29 DIAGNOSIS — I10 Essential (primary) hypertension: Secondary | ICD-10-CM

## 2013-10-29 LAB — BASIC METABOLIC PANEL
BUN: 27 mg/dL — ABNORMAL HIGH (ref 6–23)
CO2: 19 mEq/L (ref 19–32)
CREATININE: 1 mg/dL (ref 0.50–1.10)
Calcium: 8.7 mg/dL (ref 8.4–10.5)
Chloride: 96 mEq/L (ref 96–112)
GFR calc Af Amer: 69 mL/min — ABNORMAL LOW (ref >90)
GFR calc non Af Amer: 60 mL/min — ABNORMAL LOW (ref >90)
Glucose, Bld: 246 mg/dL — ABNORMAL HIGH (ref 70–99)
Potassium: 4.4 mEq/L (ref 3.7–5.3)
Sodium: 132 mEq/L — ABNORMAL LOW (ref 137–147)

## 2013-10-29 LAB — GLUCOSE, CAPILLARY
Glucose-Capillary: 221 mg/dL — ABNORMAL HIGH (ref 70–99)
Glucose-Capillary: 289 mg/dL — ABNORMAL HIGH (ref 70–99)

## 2013-10-29 MED ORDER — NITROGLYCERIN 0.4 MG SL SUBL: 0.4000 mg | SUBLINGUAL_TABLET | SUBLINGUAL | Status: AC | PRN

## 2013-10-29 MED ORDER — ROSUVASTATIN CALCIUM 20 MG PO TABS
20.0000 mg | ORAL_TABLET | Freq: Every day | ORAL | Status: DC
Start: 2013-10-29 — End: 2013-10-29

## 2013-10-29 MED ORDER — CARVEDILOL 12.5 MG PO TABS: 12.5000 mg | ORAL_TABLET | Freq: Two times a day (BID) | ORAL | Status: DC

## 2013-10-29 MED ORDER — ASPIRIN 81 MG PO TABS: 81.0000 mg | ORAL_TABLET | Freq: Every day | ORAL | Status: DC

## 2013-10-29 MED ORDER — NITROGLYCERIN 0.4 MG SL SUBL: 0.4000 mg | SUBLINGUAL_TABLET | SUBLINGUAL | Status: DC | PRN

## 2013-10-29 MED ORDER — ATORVASTATIN CALCIUM 80 MG PO TABS
80.0000 mg | ORAL_TABLET | Freq: Every day | ORAL | Status: DC
Start: 2013-10-29 — End: 2013-10-29

## 2013-10-29 MED ORDER — PANTOPRAZOLE SODIUM 40 MG PO TBEC
40.0000 mg | DELAYED_RELEASE_TABLET | Freq: Every day | ORAL | Status: DC
Start: 2013-10-29 — End: 2013-10-29

## 2013-10-29 MED ORDER — CLOPIDOGREL BISULFATE 75 MG PO TABS
75.0000 mg | ORAL_TABLET | Freq: Every day | ORAL | Status: DC
Start: 2013-10-29 — End: 2013-10-29

## 2013-10-29 MED ORDER — PANTOPRAZOLE SODIUM 40 MG PO TBEC
40.0000 mg | DELAYED_RELEASE_TABLET | Freq: Every day | ORAL | Status: DC
Start: 2013-10-29 — End: 2013-11-17

## 2013-10-29 MED ORDER — CLOPIDOGREL BISULFATE 75 MG PO TABS
75.0000 mg | ORAL_TABLET | Freq: Every day | ORAL | Status: DC
Start: 2013-10-29 — End: 2014-10-30

## 2013-10-29 MED ORDER — FUROSEMIDE 40 MG PO TABS: 40.0000 mg | ORAL_TABLET | Freq: Every day | ORAL | Status: DC

## 2013-10-29 MED ORDER — ROSUVASTATIN CALCIUM 20 MG PO TABS: 20.0000 mg | ORAL_TABLET | Freq: Every day | ORAL | Status: DC

## 2013-10-29 NOTE — Discharge Instructions (Signed)
PLEASE REMEMBER TO BRING ALL OF YOUR MEDICATIONS TO EACH OF YOUR FOLLOW-UP OFFICE VISITS. ° °PLEASE ATTEND ALL SCHEDULED FOLLOW-UP APPOINTMENTS.  ° °Activity: Increase activity slowly as tolerated. You may shower, but no soaking baths (or swimming) for 1 week. No driving for 1 week. No lifting over 5 lbs for 2 weeks. No sexual activity for 1 week.  ° °You May Return to Work: in 3 weeks (if applicable) ° °Wound Care: You may wash cath site gently with soap and water. Keep cath site clean and dry. If you notice pain, swelling, bleeding or pus at your cath site, please call [phone]. ° ° ° °Cardiac Cath Site Care °Refer to this sheet in the next few weeks. These instructions provide you with information on caring for yourself after your procedure. Your caregiver may also give you more specific instructions. Your treatment has been planned according to current medical practices, but problems sometimes occur. Call your caregiver if you have any problems or questions after your procedure. °HOME CARE INSTRUCTIONS °· You may shower 24 hours after the procedure. Remove the bandage (dressing) and gently wash the site with plain soap and water. Gently pat the site dry.  °· Do not apply powder or lotion to the site.  °· Do not sit in a bathtub, swimming pool, or whirlpool for 5 to 7 days.  °· No bending, squatting, or lifting anything over 10 pounds (4.5 kg) as directed by your caregiver.  °· Inspect the site at least twice daily.  °· Do not drive home if you are discharged the same day of the procedure. Have someone else drive you.  °· You may drive 24 hours after the procedure unless otherwise instructed by your caregiver.  °What to expect: °· Any bruising will usually fade within 1 to 2 weeks.  °· Blood that collects in the tissue (hematoma) may be painful to the touch. It should usually decrease in size and tenderness within 1 to 2 weeks.  °SEEK IMMEDIATE MEDICAL CARE IF: °· You have unusual pain at the site or down the  affected limb.  °· You have redness, warmth, swelling, or pain at the site.  °· You have drainage (other than a small amount of blood on the dressing).  °· You have chills.  °· You have a fever or persistent symptoms for more than 72 hours.  °· You have a fever and your symptoms suddenly get worse.  °· Your leg becomes pale, cool, tingly, or numb.  °· You have heavy bleeding from the site. Hold pressure on the site.  °Document Released: 09/27/2010 Document Revised: 08/14/2011 Document Reviewed:  ° °

## 2013-10-29 NOTE — Progress Notes (Signed)
Nursing Note Patient given discharge instructions, AVS, and medication list. Patients prescriptions sent to home pharmacy [facility]. All questions were answered will discharge home as ordered. Adrien Dietzman, Bettina Gavia RN

## 2013-10-29 NOTE — Progress Notes (Signed)
SUBJECTIVE:  Doing well with no chest pain or SOB  OBJECTIVE:   Vitals:   Filed Vitals:   10/28/13 0900 10/28/13 1300 10/28/13 2125 10/29/13 0412  BP:  102/50 108/43 116/68  Pulse:  88 81 85  Temp:  98.1 F (36.7 C) 98.2 F (36.8 C) 98.4 F (36.9 C)  TempSrc:  Oral Oral Oral  Resp:  20 18 19   Height:      Weight:    209 lb 14.1 oz (95.2 kg)  SpO2: 97% 98% 98% 96%   I&O's:   Intake/Output Summary (Last 24 hours) at 10/29/13 0841 Last data filed at 10/28/13 1206  Gross per 24 hour  Intake    240 ml  Output      0 ml  Net    240 ml   TELEMETRY: Reviewed telemetry pt in NSR:     PHYSICAL EXAM General: Well developed, well nourished, in no acute distress Head: Eyes PERRLA, No xanthomas.   Normal cephalic and atramatic  Lungs:   Clear bilaterally to auscultation and percussion. Heart:   HRRR S1 S2 Pulses are 2+ & equal. Abdomen: Bowel sounds are positive, abdomen soft and non-tender without masses  Extremities:   No clubbing, cyanosis or edema.  DP +1 Neuro: Alert and oriented X 3. Psych:  Good affect, responds appropriately   LABS: Basic Metabolic Panel:  Recent Labs  10/28/13 2100 10/29/13 0335  NA 128* 132*  K 4.7 4.4  CL 93* 96  CO2 19 19  GLUCOSE 375* 246*  BUN 30* 27*  CREATININE 1.25* 1.00  CALCIUM 8.8 8.7   Liver Function Tests:  Recent Labs  10/26/13 1400 10/26/13 1710  AST 41* 82*  ALT 26 29  ALKPHOS 50 47  BILITOT 0.3 0.4  PROT 7.7 7.3  ALBUMIN 3.8 3.5   No results found for this basename: LIPASE, AMYLASE,  in the last 72 hours CBC:  Recent Labs  10/26/13 1710 10/26/13 2140 10/27/13 0307  WBC 12.3*  --  10.3  NEUTROABS 9.7*  --   --   HGB 11.3*  --  10.4*  HCT 35.0*  --  32.6*  MCV 86.4  --  86.9  PLT 335 319 318   Cardiac Enzymes:  Recent Labs  10/26/13 1400  10/26/13 2251 10/27/13 0307 10/28/13 0320  CKTOTAL 176  --   --   --   --   CKMB 10.6*  --   --   --   --   TROPONINI 2.52*  < > >20.00* >20.00* 12.83*  < >  = values in this interval not displayed. BNP: No components found with this basename: POCBNP,  D-Dimer: No results found for this basename: DDIMER,  in the last 72 hours Hemoglobin A1C:  Recent Labs  10/26/13 1400  HGBA1C 7.1*   Fasting Lipid Panel:  Recent Labs  10/27/13 0307  CHOL 115  HDL 21*  LDLCALC 47  TRIG 236*  CHOLHDL 5.5   Thyroid Function Tests:  Recent Labs  10/26/13 1710  TSH 1.904   Anemia Panel: No results found for this basename: VITAMINB12, FOLATE, FERRITIN, TIBC, IRON, RETICCTPCT,  in the last 72 hours Coag Panel:   Lab Results  Component Value Date   INR 7.60* 10/26/2013    RADIOLOGY: Dg Chest 2 View  10/28/2013   CLINICAL DATA:  Recent heart attack, evaluate for CHF  EXAM: CHEST  2 VIEW  COMPARISON:  DG CHEST 2 VIEW dated 09/17/2013; DG CHEST 2 VIEW dated 05/10/2013  FINDINGS: Grossly unchanged borderline enlarged cardiac silhouette. Normal mediastinal contours. The lungs are hyperexpanded with flattening of the bilateral hemidiaphragms. Minimal bibasilar opacities, right greater than left, likely atelectasis. No discrete focal airspace opacities. No pleural effusion or pneumothorax. No evidence of edema. Grossly unchanged bones including lower cervical ACDF, incompletely evaluated. Stigmata of DISH within the mid thoracic spine.  IMPRESSION: 1. Borderline cardiomegaly without evidence of edema. 2. Mild lung hyperexpansion and bibasilar atelectasis without acute cardiopulmonary disease.   Electronically Signed   By: Sandi Mariscal M.D.   On: 10/28/2013 07:58   ASSESSMENT:  1. S/p Lateral stemi, treated with PCI  2. Acute combined systolic and diastolic HF, improved with diuresis  3. Acute kidney injury due to diuresis, low output, and ARB - creatinine back to normal today 4.  DM - BS elevated in hospital but she has not been getting her Humulin R since they do not carry it here.  Her BS are well controlled at home.  Her metformin has also been on  hold.  Plan:  1.  Home today 2.  Will re-institute ARB when renal function stable as OP  3.  Continue lasix since renal function stable - HCTZ stopped 4.  Followup with Dr. Tamala Julian in [redacted] week along with BMET 5.  Continue amlodipine/ASA/statin/Coreg/Plavix/Lasix 6.  Restart Metformin - cath 48 hours ago   Sueanne Margarita, MD  10/29/2013  8:41 AM

## 2013-10-29 NOTE — Discharge Summary (Signed)
CARDIOLOGY DISCHARGE SUMMARY   Patient ID: Danielle Vargas MRN: [id number] DOB/AGE: [birth year] 62 y.o.  Admit date: 10/26/2013 Discharge date: 10/29/2013  PCP: Marton Redwood, MD Primary Cardiologist: Tamala Julian  Primary Discharge Diagnosis:    ST elevation myocardial infarction (STEMI) involving left circumflex coronary artery with complication  Secondary Discharge Diagnosis:    Acute ST elevation myocardial infarction (STEMI)   DM (diabetes mellitus)   HTN (hypertension)   Acute combined systolic and diastolic heart failure  Procedures: 1. Left heart catheterization; 2. Coronary angiography; 3. Left ventriculography; 4. PTCA obtuse marginal #1, 5. 2-D echocardiogram   Hospital Course: Danielle Vargas is a 63 y.o. female with no previous history of CAD. She had chest pain and came to the emergency room where she had acute lateral ST elevation. She was taken urgently to cath lab.  Cardiac catheterization results are below. She had successful angioplasty to the OM1 and there were good results but a small branch remained occluded. Her EF was 45-50% by cath. A 2-D echocardiogram was performed, results are below. Her EF by echocardiogram was 35-40%. This will be followed as an outpatient.  She has a history of diabetes and the hospital did not. Her home insulin so she was managed with sliding scale insulin. Her metformin was on hold because of the heart catheterization. He will be restarted at discharge and she is to resume her home insulin regimen.  A lipid profile was checked, results are below. She was placed on Lipitor 80 mg daily but has had problems with Lipitor in the past. She was changed to Crestor at discharge and will followup as an outpatient. She was seen by cardiac rehabilitation ambulated with them. She will go to outpatient cardiac rehabilitation after discharge.  She has a history of hypertension. She was started on Coreg and this was increased as her blood pressure and  heart rate would allow. She was continued on her previous dose of Norvasc. It will help her blood pressure control and she was also noted to have radial artery spasm at cath.  She was noted to have some shortness of breath and her BNP was elevated. She was given IV Lasix for diuresis and her home diuretic will be changed from HCTZ 12.5 to Lasix 40 mg daily.  She had acute renal insufficiency after the cath with a BUN and creatinine that peaked at 30/1.25. She had been on her home dose of losartan at 50 mg twice a day, but because of the renal insufficiency, this is discontinued. Consideration can be given to restarting it as an outpatient.  Her condition gradually improved. On 2/21, she was seen by Dr. Radford Pax in all data were reviewed. She was increasing her ambulation without chest pain or shortness of breath. Her vital signs were stable. Dr. Radford Pax felt no further inpatient workup was indicated and she is considered stable for discharge, to followup as an outpatient.  Labs:   Lab Results  Component Value Date   WBC 10.3 10/27/2013   HGB 10.4* 10/27/2013   HCT 32.6* 10/27/2013   MCV 86.9 10/27/2013   PLT 318 10/27/2013    Recent Labs Lab 10/26/13 1710  10/29/13 0335  NA 130*  < > 132*  K 4.9  < > 4.4  CL 97  < > 96  CO2 17*  < > 19  BUN 19  < > 27*  CREATININE 1.03  < > 1.00  CALCIUM 8.9  < > 8.7  PROT 7.3  --   --  BILITOT 0.4  --   --   ALKPHOS 47  --   --   ALT 29  --   --   AST 82*  --   --   GLUCOSE 228*  < > 246*  < > = values in this interval not displayed.  Recent Labs  10/26/13 1400  10/26/13 2251 10/27/13 0307 10/28/13 0320  CKTOTAL 176  --   --   --   --   CKMB 10.6*  --   --   --   --   TROPONINI 2.52*  < > >20.00* >20.00* 12.83*  < > = values in this interval not displayed. Lipid Panel     Component Value Date/Time   CHOL 115 10/27/2013 0307   TRIG 236* 10/27/2013 0307   HDL 21* 10/27/2013 0307   CHOLHDL 5.5 10/27/2013 0307   VLDL 47* 10/27/2013 0307    LDLCALC 47 10/27/2013 0307    Pro B Natriuretic peptide (BNP)  Date/Time Value Ref Range Status  10/27/2013  3:07 AM 2254.0* 0 - 125 pg/mL Final  05/10/2013  7:03 AM 149.3* 0 - 125 pg/mL Final   Lab Results  Component Value Date   TSH 1.904 10/26/2013      Radiology: Dg Chest 2 View 10/28/2013   CLINICAL DATA:  Recent heart attack, evaluate for CHF  EXAM: CHEST  2 VIEW  COMPARISON:  DG CHEST 2 VIEW dated 09/17/2013; DG CHEST 2 VIEW dated 05/10/2013  FINDINGS: Grossly unchanged borderline enlarged cardiac silhouette. Normal mediastinal contours. The lungs are hyperexpanded with flattening of the bilateral hemidiaphragms. Minimal bibasilar opacities, right greater than left, likely atelectasis. No discrete focal airspace opacities. No pleural effusion or pneumothorax. No evidence of edema. Grossly unchanged bones including lower cervical ACDF, incompletely evaluated. Stigmata of DISH within the mid thoracic spine.  IMPRESSION: 1. Borderline cardiomegaly without evidence of edema. 2. Mild lung hyperexpansion and bibasilar atelectasis without acute cardiopulmonary disease.   Electronically Signed   By: Sandi Mariscal M.D.   On: 10/28/2013 07:58    Cardiac Cath: 44/11/4740 COMPLICATIONS: Prolonged time to reperfusion do to radial artery spasm that forced [place] to abort the radial approach. This was followed by prolonged time required to gain access in the right femoral artery due to the patient's obesity.  HEMODYNAMICS: Aortic pressure 175/103 mmHg ; LV pressure 175/18 mmHg ; LVEDP 23 mm mercury  ANGIOGRAPHIC DATA: The left main coronary artery is short but widely patent .  The left anterior descending artery is widely patent and transapical. The proximal vessel contains moderate calcification and eccentric 30-40% stenosis to a small diagonal branches arise from the LAD. The LAD and diagonal branches are free of any significant obstruction.  The left circumflex artery is a large caliber vessel. 2 obtuse marginals  arise from this vessel. The first obtuse marginal is totally occluded in its distal half. The remainder of the circumflex is widely patent .  The right coronary artery is dominant and widely patent without any significant obstruction.  PCI RESULTS: Angioplasty of the distal OM 1 segment reduced C1 100% stenosis to 0% with TIMI grade 3 flow. A small branch of this artery remained occluded and was seen to fill by contrast staining but did not have good antegrade flow to  LEFT VENTRICULOGRAM: Left ventricular angiogram was done in the 30 RAO projection and revealed apical severe hypokinesis/akinesis with an estimated ejection fraction of 45-50%  IMPRESSIONS: 1. Acute lateral wall ST elevation myocardial infarction do to acute occlusion  of the distal half of the first obtuse marginal. This could have been related to spontaneous coronary artery dissection.  2. Successful angioplasty of the first obtuse marginal with reestablishment of flow to the great majority of the distal bed in question.  3. Widely patent circumflex, RCA, and LAD. The LAD contains proximal calcification and less than 50% stenosis.  4. Left ventricular dysfunction with severe inferoapical hypokinesis  EKG: 10/28/2013 Sinus rhythm, heart rate 90 Possible Left atrial enlargement Right axis deviation Pulmonary disease pattern Possible Inferior infarct , age undetermined T wave abnormality, consider lateral ischemia Prolonged QT  Echo: 10/26/2013 Conclusion Left ventricle: The cavity size was normal. Systolic function was moderately reduced. The estimated ejection fraction was in the range of 35% to 40%. There is akinesis of the mid-distallateral, inferoseptal, and apical myocardium. Doppler parameters are consistent with abnormal left ventricular relaxation (grade 1 diastolic dysfunction). No evidence of thrombus.   FOLLOW UP PLANS AND APPOINTMENTS Allergies  Allergen Reactions  . Invokana [Canagliflozin] Diarrhea  .  Nsaids Other (See Comments)    GI problems     Medication List    STOP taking these medications       hydrochlorothiazide 12.5 MG capsule  Commonly known as:  MICROZIDE     losartan 50 MG tablet  Commonly known as:  COZAAR     omeprazole 20 MG capsule  Commonly known as:  PRILOSEC      TAKE these medications       albuterol 108 (90 BASE) MCG/ACT inhaler  Commonly known as:  PROVENTIL HFA;VENTOLIN HFA  Inhale 2 puffs into the lungs every 6 (six) hours as needed. For shortness of breath.     amLODipine 5 MG tablet  Commonly known as:  NORVASC  Take 5 mg by mouth daily.     aspirin 81 MG tablet  Take 1 tablet (81 mg total) by mouth daily.     CALCIUM 500/D 500-200 MG-UNIT per tablet  Generic drug:  calcium-vitamin D  Take 2 tablets by mouth daily.     carisoprodol 350 MG tablet  Commonly known as:  SOMA  Take 350 mg by mouth 4 (four) times daily.     carvedilol 12.5 MG tablet  Commonly known as:  COREG  Take 1 tablet (12.5 mg total) by mouth 2 (two) times daily with a meal.     clopidogrel 75 MG tablet  Commonly known as:  PLAVIX  Take 1 tablet (75 mg total) by mouth daily with breakfast.     Co Q 10 100 MG Caps  Take 2 tablets by mouth daily.     fenofibrate 160 MG tablet  Take 160 mg by mouth at bedtime.     furosemide 40 MG tablet  Commonly known as:  LASIX  Take 1 tablet (40 mg total) by mouth daily.     gabapentin 300 MG capsule  Commonly known as:  NEURONTIN  Take 300 mg by mouth 3 (three) times daily.     HUMULIN R 500 UNIT/ML Soln injection  Generic drug:  insulin regular human CONCENTRATED  Inject 50-55 Units into the skin 3 (three) times daily with meals. 55 units (11 units using insulin syringe.)  with breakfast, 55 units ( 11 units using insulin syringe)  with lunch and 50 units (10 units using insulin syringe) with supper.     HYDROcodone-acetaminophen 5-325 MG per tablet  Commonly known as:  NORCO/VICODIN  Take 1 tablet by mouth every 6  (six) hours as needed. For  joint and back pain.     Insulin Pen Needle 31G X 8 MM Misc  Commonly known as:  CLICKFINE PEN NEEDLES  Use 3x a day     INSULIN SYRINGE 1CC/31GX5/16" 31G X 5/16" 1 ML Misc  Use to inject insulin 3 times daily as instructed.     Insulin Syringe-Needle U-100 31G X 5/16" 1 ML Misc  Commonly known as:  BD INSULIN SYRINGE ULTRAFINE  Use 5x a day as advised     levothyroxine 75 MCG tablet  Commonly known as:  SYNTHROID, LEVOTHROID  Take 75 mcg by mouth daily before breakfast.     metFORMIN 1000 MG tablet  Commonly known as:  GLUCOPHAGE  Take 1,000 mg by mouth 2 (two) times daily with a meal.     metoCLOPramide 10 MG tablet  Commonly known as:  REGLAN  Take 10 mg by mouth 4 (four) times daily.     montelukast 10 MG tablet  Commonly known as:  SINGULAIR  Take 10 mg by mouth at bedtime.     nitroGLYCERIN 0.4 MG SL tablet  Commonly known as:  NITROSTAT  Place 1 tablet (0.4 mg total) under the tongue every 5 (five) minutes as needed for chest pain.     pantoprazole 40 MG tablet  Commonly known as:  PROTONIX  Take 1 tablet (40 mg total) by mouth daily.     polyethylene glycol packet  Commonly known as:  MIRALAX / GLYCOLAX  Take 17 g by mouth daily.     rosuvastatin 20 MG tablet  Commonly known as:  CRESTOR  Take 1 tablet (20 mg total) by mouth daily.     Syringe (Disposable) 1 ML Misc  Commonly known as:  BD TUBERCULIN SYRINGE  Use 3x a day     topiramate 50 MG tablet  Commonly known as:  TOPAMAX  Take 50 mg by mouth 2 (two) times daily.     Vitamin D (Ergocalciferol) 50000 UNITS Caps capsule  Commonly known as:  DRISDOL  Take 50,000 Units by mouth every 7 (seven) days. Take on thursdays        Discharge Orders   Future Appointments Provider Department Dept Phone   11/07/2013 2:00 PM Philemon Kingdom, MD [facility] [phone]   Future Orders Complete By Expires   Amb Referral to Cardiac Rehabilitation  As directed     Diet - low sodium heart healthy  As directed    Diet Carb Modified  As directed    Increase activity slowly  As directed      Follow-up Information   Follow up with Sinclair Grooms, MD. (The office will call)    Specialty:  Cardiology   Contact information:   [address] [address] [phone]       BRING ALL MEDICATIONS WITH YOU TO FOLLOW UP APPOINTMENTS  Time spent with patient to include physician time: 43 min Signed: Rosaria Ferries, PA-C 10/29/2013, 1:19 PM Co-Sign MD

## 2013-10-29 NOTE — Progress Notes (Signed)
CARDIAC REHAB PHASE I   PRE:  Rate/Rhythm: 97 SR  BP:  Sitting: 126/56     SaO2: 98 RA  MODE:  Ambulation: 400 ft   POST:  Rate/Rhythm: 104 ST  BP:  Sitting: 128/64    SaO2: 99 RA  Pt walked 400 ft with no CP, with c/o hip pain and mild SOB.  Pt was independent with rest x2.  Patient overall tolerated walk well.  Pt stated she did not have any questions at this time. [id number]  Lillia Dallas MS, ACSM RCEP 8:36 AM 10/29/2013

## 2013-10-31 ENCOUNTER — Ambulatory Visit (INDEPENDENT_AMBULATORY_CARE_PROVIDER_SITE_OTHER): Payer: Medicare Other | Admitting: Ophthalmology

## 2013-11-04 ENCOUNTER — Telehealth: Payer: Self-pay | Admitting: Interventional Cardiology

## 2013-11-04 ENCOUNTER — Telehealth: Payer: Self-pay | Admitting: *Deleted

## 2013-11-04 NOTE — Telephone Encounter (Signed)
Pt called stating her bg has been low since she has had a heart attack. Pt is concerned, her bg has been 87 and she has cut her AM insulin to 10 units and has not taken her lunch insulin at all, hoping to bring her number up. Spoke with Dr Cruzita Lederer and she wants to see pt. Scheduled pt to come in on Monday, March 2nd at 3:00 pm. Be advised.

## 2013-11-04 NOTE — Telephone Encounter (Signed)
lmom.Dr.Smith does not have any availbale o/v appt before 11/17/13.pt should keep sch appt with S.Weaver,PA. pt can call back to discuss if she would like

## 2013-11-04 NOTE — Telephone Encounter (Signed)
New Message  Pt was recently seen in the hospital// EPH appt required// Made appt with Richardson Dopp on 11/17/2013// Pt is requesting call back for a sooner appt with Dr. Smith// Please call back to assist.

## 2013-11-07 ENCOUNTER — Ambulatory Visit: Payer: Medicare Other | Admitting: Internal Medicine

## 2013-11-08 ENCOUNTER — Ambulatory Visit (INDEPENDENT_AMBULATORY_CARE_PROVIDER_SITE_OTHER): Payer: Medicare Other | Admitting: Internal Medicine

## 2013-11-08 ENCOUNTER — Other Ambulatory Visit: Payer: Self-pay

## 2013-11-08 ENCOUNTER — Encounter: Payer: Self-pay | Admitting: Internal Medicine

## 2013-11-08 VITALS — BP 120/62 | HR 77 | Temp 97.8°F | Resp 12 | Wt 210.8 lb

## 2013-11-08 DIAGNOSIS — E139 Other specified diabetes mellitus without complications: Secondary | ICD-10-CM

## 2013-11-08 DIAGNOSIS — E119 Type 2 diabetes mellitus without complications: Secondary | ICD-10-CM

## 2013-11-08 MED ORDER — INSULIN GLARGINE 100 UNIT/ML ~~LOC~~ SOLN
60.0000 [IU] | Freq: Every day | SUBCUTANEOUS | Status: DC
Start: 2013-11-08 — End: 2013-11-17

## 2013-11-08 MED ORDER — INSULIN PEN NEEDLE 31G X 5 MM MISC: Status: DC

## 2013-11-08 MED ORDER — INSULIN REGULAR HUMAN 100 UNIT/ML IJ SOLN: 25.0000 [IU] | Freq: Three times a day (TID) | INTRAMUSCULAR | Status: DC

## 2013-11-08 NOTE — Progress Notes (Signed)
Patient ID: Danielle Vargas, female   DOB: [birth year], 63 y.o.   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 Last visit ~ 1 mo ago.  She had an AMI 10/26/2013 - had angioplasty, no stent placed.  Last hemoglobin A1c was:  Lab Results  Component Value Date   HGBA1C 7.1* 10/26/2013  Prev. 7.9%, 07/07/2013. Previously lower.  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  Pt was on a regimen of: - Metformin 1000 mg bid - Humalog 75/25 Kwikpen 92 units tid (total daily dose 276 units) She tried Lantus but "it did nothing for me". She tried Levemir with same effect. She has been on Glipizide in the past, too. She tried Invokana in the past >> severe diarrhea She could not afford U500 insulin.  At last visit, we wanted to switch to: - Metformin 1000 mg bid - Lantus 100 units at night - Humalog 40 units tid  However, she found out that this was more expensive than U500, so she went back to U500:  Current regimen: - Metformin 1000 mg bid  - U500 0.11-0.11-0.10 units before meals >> 0.09 mL tid since yesterday   Pt checks her sugars 5-6x a day and they are - brings a detailed log - has more lows lately >> had to back off insulin doses: - am: 84-151 (247 highest) >>99-248, with 2 sugars at 59 >> 122-188 >> 96-194 (104-130) - 2h after b'fast: 120-257 >> 163-271 >> 155-191 >> 74-188 - before lunch: 128-290 >> 113-257 >> 77-141 >> 68-166 - 2h after lunch: 92-248 >> 200-266 >> 129-143 >> 107-184 (highest 243) - before dinner: 141-245 >> 141-264 >> 64-108 >> 95-196 - 2h after dinner: 153-224 >> 110-298 >> 180-209 >> 149-197 - bedtime: 95-276 >> 88-266 >> 127-204 >> 94-209 - nighttime: 161-310 >> n/c >> 75-232 >> 136-291  She has lows - lowest 64; she has hypoglycemia awareness at 95.  Highest sugar was  209.  Pt's meals are: - Breakfast: cheese toast + diet coke >> egg beater + toast + grits + butter + bacon + juice - Lunch: sandwich;  salad + cheese + croutons;  baked potato >> pinto beans + cornbread - Dinner: leftovers or corn bread + pinto beans + occas. Cole slaw >> salad + lite dressing + saltines - Snacks: no Not soft drinks anymore.  - has mild CKD, last BUN/creatinine:  Lab Results  Component Value Date   BUN 27* 10/29/2013   CREATININE 1.00 10/29/2013  She is on Losartan. - pt has HL, but no lipids available for review She is on Fenofibrate. - last eye exam was in 04/2013 - Dr Zigmund Daniel. R>L DR.  - + numbness and tingling in her feet. On neurontin.  I reviewed her chart and she also has a history of HTN, HL, fatty liver, GERD, morbid obesity, hypothyroidism, migraines, chronic back pain, history of nephrolithiasis, osteoporosis, diverticulosis.  I reviewed pt's medications, allergies, PMH, social hx, family hx and no changes required, except as mentioned above.  ROS: Constitutional:+ weight loss, + fatigue, no subjective hyperthermia/hypothermia Eyes:no blurry vision, no xerophthalmia ENT: + sore throat, no nodules palpated in throat, no dysphagia/odynophagia, no hoarseness Cardiovascular: +CP/+ SOB/+ palpitations/leg swelling Respiratory: + cough/+ SOB/+ wheezing Gastrointestinal: no N/V/D/C Musculoskeletal: no muscle/ joint pain Skin: no rashes Neurological: no tremors/numbness/tingling/dizziness  PE: BP 120/62  Pulse  77  Temp(Src) 97.8 F (36.6 C) (Oral)  Resp 12  Wt 210 lb 12.8 oz (95.618 kg)  SpO2 97% Wt Readings from Last 3 Encounters:  11/08/13 210 lb 12.8 oz (95.618 kg)  10/29/13 209 lb 14.1 oz (95.2 kg)  10/29/13 209 lb 14.1 oz (95.2 kg)   Constitutional: overweight, in NAD Eyes: PERRLA, EOMI, no exophthalmos ENT: moist mucous membranes, no thyromegaly, no cervical lymphadenopathy Cardiovascular: RRR, No MRG Respiratory: CTA B Gastrointestinal:  abdomen soft, NT, ND, BS+ Musculoskeletal: no deformities, strength intact in all 4 Skin: moist, warm, no rashes  ASSESSMENT: 1. DM2, insulin-dependent, uncontrolled, with complications - peripheral neuropathy, on Neurontin - Gastroparesis, on Reglan - Dr. 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 Patient with long-standing, recently more controlled diabetes - especially after having angioplasty after her recent AMI. She is currently on U500 insulin, but decreased to 135 units a day, with persistent lows.  - We discussed about options for treatment, and I suggested to:  Patient Instructions  Stop U500 insulin. Start Lantus 60 units at bedtime. Start Novolin R U100  - 20 units with a regular meal  - 25 units with a larger meal Please call me or send me a message through MyChart if sugars consistently <80 or >200. Call me when you are close to running out of U100 R insulin and I will call in Humalog. Also, let me know if you want me to call in vials of Lantus rather than pens.  - called in Rx for pen needles - continue checking sugars at different times of the day and write them down - she is doing an exemplary job with this - needs to reschedule her eye appt with Dr Zigmund Daniel - given more sugar logs - Return to clinic in 1 mo with sugar log

## 2013-11-08 NOTE — Patient Instructions (Addendum)
Stop U500 insulin. Start Lantus 60 units at bedtime. Start Novolin R U100  - 20 units with a regular meal  - 25 units with a larger meal Please call me or send me a message through MyChart if sugars consistently <80 or >200. Call me when you are close to running out of U100 R insulin and I will call in Humalog. Also, let me know if you want me to call in vials of Lantus rather than pens.

## 2013-11-09 ENCOUNTER — Telehealth: Payer: Self-pay | Admitting: Interventional Cardiology

## 2013-11-09 NOTE — Telephone Encounter (Signed)
New message     Pt was seen in hosp for an angioplasty.  Can she now drive?

## 2013-11-10 NOTE — Telephone Encounter (Signed)
Routed to [place] for advisement

## 2013-11-12 NOTE — Telephone Encounter (Signed)
Ok to drive 

## 2013-11-14 NOTE — Telephone Encounter (Signed)
pt aware ok for pt to drive per Dr.Smith.pt verbalized understanding.

## 2013-11-16 ENCOUNTER — Other Ambulatory Visit: Payer: Self-pay | Admitting: *Deleted

## 2013-11-16 ENCOUNTER — Telehealth: Payer: Self-pay | Admitting: Internal Medicine

## 2013-11-16 MED ORDER — INSULIN REGULAR HUMAN 100 UNIT/ML IJ SOLN: INTRAMUSCULAR | Status: DC

## 2013-11-16 NOTE — Telephone Encounter (Signed)
Pt needs quick acting insulin called into [facility]

## 2013-11-16 NOTE — Telephone Encounter (Signed)
Refill for new insulin, Novolin R U100 sent to [facility].

## 2013-11-17 ENCOUNTER — Encounter: Payer: Medicare Other | Admitting: Physician Assistant

## 2013-11-17 ENCOUNTER — Encounter: Payer: Self-pay | Admitting: Physician Assistant

## 2013-11-17 ENCOUNTER — Telehealth: Payer: Self-pay | Admitting: *Deleted

## 2013-11-17 ENCOUNTER — Other Ambulatory Visit: Payer: Self-pay | Admitting: *Deleted

## 2013-11-17 ENCOUNTER — Ambulatory Visit (INDEPENDENT_AMBULATORY_CARE_PROVIDER_SITE_OTHER): Payer: Medicare Other | Admitting: Physician Assistant

## 2013-11-17 VITALS — BP 138/77 | HR 80 | Ht 61.0 in | Wt 208.0 lb

## 2013-11-17 DIAGNOSIS — I2129 ST elevation (STEMI) myocardial infarction involving other sites: Secondary | ICD-10-CM

## 2013-11-17 DIAGNOSIS — I2121 ST elevation (STEMI) myocardial infarction involving left circumflex coronary artery: Secondary | ICD-10-CM

## 2013-11-17 DIAGNOSIS — I219 Acute myocardial infarction, unspecified: Secondary | ICD-10-CM

## 2013-11-17 DIAGNOSIS — IMO0002 Reserved for concepts with insufficient information to code with codable children: Secondary | ICD-10-CM

## 2013-11-17 DIAGNOSIS — N289 Disorder of kidney and ureter, unspecified: Secondary | ICD-10-CM

## 2013-11-17 DIAGNOSIS — D649 Anemia, unspecified: Secondary | ICD-10-CM | POA: Insufficient documentation

## 2013-11-17 DIAGNOSIS — I5041 Acute combined systolic (congestive) and diastolic (congestive) heart failure: Secondary | ICD-10-CM

## 2013-11-17 MED ORDER — PANTOPRAZOLE SODIUM 40 MG PO TBEC: 40.0000 mg | DELAYED_RELEASE_TABLET | Freq: Every day | ORAL | Status: DC

## 2013-11-17 MED ORDER — INSULIN GLARGINE 100 UNIT/ML ~~LOC~~ SOLN: 60.0000 [IU] | Freq: Every day | SUBCUTANEOUS | Status: DC

## 2013-11-17 MED ORDER — ROSUVASTATIN CALCIUM 40 MG PO TABS: 20.0000 mg | ORAL_TABLET | Freq: Every day | ORAL | Status: DC

## 2013-11-17 MED ORDER — CARVEDILOL 12.5 MG PO TABS: 18.7500 mg | ORAL_TABLET | Freq: Two times a day (BID) | ORAL | Status: DC

## 2013-11-17 NOTE — Telephone Encounter (Signed)
Pt needs Lantus until her rx comes in from [place]. Done.

## 2013-11-17 NOTE — Assessment & Plan Note (Signed)
No evidence of heart failure and exam today. Patient does have LV dysfunction on cardiac cath and 2-D echo although there is a discrepancy in the numbers  as dictated above.

## 2013-11-17 NOTE — Progress Notes (Signed)
HPI:  This is a 63 year old female patient of Dr. Pernell Dupre who is admitted to the hospital with a non-ST elevation MI treated with PTCA the OM1 with good results but a small branch remained occluded. EF was 45-50% by cath. 2-D echo EF was 35-40%. She also has a history of hypertension and diabetes mellitus. She also had radial  artery spasm after cath. She also had renal insufficiency post-cath with peak BUN and creatinine of 30/1.25. Her home dose of losartan next 50 mg twice a day was stopped because of this.  The patient comes in today feeling well overall. She dramatically changed her diet and has lost 7 pounds. She is watching her salt closely. She saw Dr. Brigitte Pulse back in followup who checked her kidney function and told her he didn't want to start the Cozaar back and will recheck her in May. She is open to start cardiac rehabilitation soon. She denies any chest pain, palpitations, dyspnea, dyspnea on exertion, dizziness, or presyncope. Her main complaint was feeling very weak. She was also anemic in the hospital and remains anemic. She unfortunately has been taking Prilosec instead of the Plavix which I have asked her to switch to. The Crestor which she is in the donut hole and hopefully will be out soon and will be able to afford it. We will try to give her samples.     Allergies-- Invokana [Canagliflozin] -- Diarrhea  -- Nsaids -- Other (See Comments)   --  GI problems  Current Outpatient Prescriptions on File Prior to Visit: albuterol (PROVENTIL HFA;VENTOLIN HFA) 108 (90 BASE) MCG/ACT inhaler, Inhale 2 puffs into the lungs every 6 (six) hours as needed. For shortness of breath., Disp: , Rfl:  amLODipine (NORVASC) 5 MG tablet, Take 5 mg by mouth daily. , Disp: , Rfl:  aspirin 81 MG tablet, Take 1 tablet (81 mg total) by mouth daily., Disp: , Rfl:  calcium-vitamin D (CALCIUM 500/D) 500-200 MG-UNIT per tablet, Take 2 tablets by mouth daily., Disp: , Rfl:  carisoprodol (SOMA) 350 MG tablet, Take  350 mg by mouth 4 (four) times daily. , Disp: , Rfl:  carvedilol (COREG) 12.5 MG tablet, Take 1 tablet (12.5 mg total) by mouth 2 (two) times daily with a meal., Disp: 60 tablet, Rfl: 11 clopidogrel (PLAVIX) 75 MG tablet, Take 1 tablet (75 mg total) by mouth daily with breakfast., Disp: 30 tablet, Rfl: 11 Coenzyme Q10 (CO Q 10) 100 MG CAPS, Take 2 tablets by mouth daily., Disp: , Rfl:  fenofibrate 160 MG tablet, Take 160 mg by mouth at bedtime. , Disp: , Rfl:  furosemide (LASIX) 40 MG tablet, Take 1 tablet (40 mg total) by mouth daily., Disp: 30 tablet, Rfl: 11 gabapentin (NEURONTIN) 300 MG capsule, Take 300 mg by mouth 3 (three) times daily., Disp: , Rfl:  HYDROcodone-acetaminophen (NORCO) 5-325 MG per tablet, Take 1 tablet by mouth every 6 (six) hours as needed. For joint and back pain., Disp: , Rfl:  insulin glargine (LANTUS) 100 UNIT/ML injection, Inject 0.6 mLs (60 Units total) into the skin at bedtime., Disp: 15 mL, Rfl: 11 Insulin Pen Needle (FIFTY50 PEN NEEDLES) 31G X 5 MM MISC, Use 4x a day, Disp: 100 each, Rfl: prn insulin regular (NOVOLIN R) 100 units/mL injection, Inject 20 units with regular meals, 25 units with large meals. Dx code: 250.00, Disp: 60 mL, Rfl: 1 Insulin Syringe-Needle U-100 (BD INSULIN SYRINGE ULTRAFINE) 31G X 5/16" 1 ML MISC, Use 5x a day as advised, Disp: 300 each, Rfl: 11  levothyroxine (SYNTHROID, LEVOTHROID) 75 MCG tablet, Take 75 mcg by mouth daily before breakfast. , Disp: , Rfl:  metFORMIN (GLUCOPHAGE) 1000 MG tablet, Take 1,000 mg by mouth 2 (two) times daily with a meal., Disp: , Rfl:  metoCLOPramide (REGLAN) 10 MG tablet, Take 10 mg by mouth 4 (four) times daily. , Disp: , Rfl:  montelukast (SINGULAIR) 10 MG tablet, Take 10 mg by mouth at bedtime., Disp: , Rfl:  nitroGLYCERIN (NITROSTAT) 0.4 MG SL tablet, Place 1 tablet (0.4 mg total) under the tongue every 5 (five) minutes as needed for chest pain., Disp: 25 tablet, Rfl: 3 pantoprazole (PROTONIX) 40 MG tablet,  Take 1 tablet (40 mg total) by mouth daily., Disp: 30 tablet, Rfl: 11 polyethylene glycol (MIRALAX / GLYCOLAX) packet, Take 17 g by mouth daily., Disp: , Rfl:  rosuvastatin (CRESTOR) 20 MG tablet, Take 1 tablet (20 mg total) by mouth daily., Disp: 30 tablet, Rfl: 11 Syringe, Disposable, (BD TUBERCULIN SYRINGE) 1 ML MISC, Use 3x a day, Disp: 100 each, Rfl: prn topiramate (TOPAMAX) 50 MG tablet, Take 50 mg by mouth 2 (two) times daily., Disp: , Rfl:  Vitamin D, Ergocalciferol, (DRISDOL) 50000 UNITS CAPS, Take 50,000 Units by mouth every 7 (seven) days. Take on thursdays, Disp: , Rfl:   No current facility-administered medications on file prior to visit.   Past Medical History:   DM (diabetes mellitus)                                       Obesity                                                      Dyslipidemia                                                 HTN (hypertension)                                           Hypothyroidism                                               Gastroparesis                                                Migraine                                                     Osteoporosis  Colon polyps                                    7/07           Comment:hyperplastic and adenomatous   Depression                                                   Nephrolithiasis                                              Diverticulosis                                               External hemorrhoid                                          Fatty liver                                                  Asthma                                                       CAD (coronary artery disease)                                  Comment:a. 12/2008 Cath: mild irregs throughout, EF 75%.  Past Surgical History:   TUBAL LIGATION                                   '85          BREAST REDUCTION SURGERY                         '88           CERVICAL DISC SURGERY                            '98            Comment:fusion   CARPAL TUNNEL RELEASE                            2000           Comment:right wrist   ROTATOR CUFF REPAIR                              '  09            Comment:left shoulder   ULNAR NERVE REPAIR                               '02, '08       Comment:left done, then right   CESAREAN SECTION                                 '79  '84    Review of patient's family history indicates:   COPD                           Father                   Stroke                         Father                   Heart failure                  Brother                  Heart failure                  Mother                   Hypertension                   Brother                  Heart attack                   Mother                     Comment: several   Colon cancer                                              Comment: mat. 1st cousin   Other                                                     Comment: celiac sprue, 1/2 brother   Diabetes                       Mother                   Diabetes                       Brother                    Comment: x 3   Lung cancer                    Brother  Cirrhosis                      Sister                     Comment: liver transplant   Kidney disease                 Mother                   Emphysema                      Father                   Social History   Marital Status: Divorced            Spouse Name:                      Years of Education:                 Number of children: 2           Occupational History Occupation          Fish farm manager            Comment              disabled                                  Social History Main Topics   Smoking Status: Never Smoker                     Smokeless Status: Never Used                       Alcohol Use: No             Drug Use: No             Sexual Activity: Not Currently          Birth  Control/Protection: Post-menopausal  Other Topics            Concern   None on file  Social History Narrative   Lives in [place].     Regular exercise: none   Caffeine use: daily; dt coke      ROS: See history of present illness   PHYSICAL EXAM: Overweight, in no acute distress. Neck: No JVD, HJR, Bruit, or thyroid enlargement  Lungs: No tachypnea, clear without wheezing, rales, or rhonchi  Cardiovascular: RRR, PMI not displaced, heart sounds normal, no murmurs, gallops, bruit, thrill, or heave.  Abdomen: BS normal. Soft without organomegaly, masses, lesions or tenderness.  Extremities: Right arm at cath site without hematoma or hemorrhage small amount of bruising good radial pulse, lower extremities without cyanosis, clubbing or edema. Good distal pulses bilateral  SKin: Warm, no lesions or rashes   Musculoskeletal: No deformities  Neuro: no focal signs  BP 138/77  Pulse 80  Ht 5\' 1"  (1.549 m)  Wt 208 lb (94.348 kg)  BMI 39.32 kg/m2    EKG: Normal sinus rhythm with lateral T wave inversion  Cardiac Cath: 71/24/5809 COMPLICATIONS: Prolonged time to reperfusion do to radial artery spasm that forced [place] to abort the radial approach. This was followed by prolonged time required to gain access in the right femoral artery  due to the patient's obesity.   HEMODYNAMICS: Aortic pressure 175/103 mmHg ; LV pressure 175/18 mmHg ; LVEDP 23 mm mercury   ANGIOGRAPHIC DATA: The left main coronary artery is short but widely patent .   The left anterior descending artery is widely patent and transapical. The proximal vessel contains moderate calcification and eccentric 30-40% stenosis to a small diagonal branches arise from the LAD. The LAD and diagonal branches are free of any significant obstruction.   The left circumflex artery is a large caliber vessel. 2 obtuse marginals arise from this vessel. The first obtuse marginal is totally occluded in its distal half. The remainder of the  circumflex is widely patent .   The right coronary artery is dominant and widely patent without any significant obstruction.   PCI RESULTS: Angioplasty of the distal OM 1 segment reduced C1 100% stenosis to 0% with TIMI grade 3 flow. A small branch of this artery remained occluded and was seen to fill by contrast staining but did not have good antegrade flow to   LEFT VENTRICULOGRAM: Left ventricular angiogram was done in the 30 RAO projection and revealed apical severe hypokinesis/akinesis with an estimated ejection fraction of 45-50%   IMPRESSIONS: 1. Acute lateral wall ST elevation myocardial infarction do to acute occlusion of the distal half of the first obtuse marginal. This could have been related to spontaneous coronary artery dissection.   2. Successful angioplasty of the first obtuse marginal with reestablishment of flow to the great majority of the distal bed in question.   3. Widely patent circumflex, RCA, and LAD. The LAD contains proximal calcification and less than 50% stenosis.   4. Left ventricular dysfunction with severe inferoapical hypokinesis  Echo: 10/26/2013 Conclusion Left ventricle: The cavity size was normal. Systolic function was moderately reduced. The estimated ejection fraction was in the range of 35% to 40%. There is akinesis of the mid-distallateral, inferoseptal, and apical myocardium. Doppler parameters are consistent with abnormal left ventricular relaxation (grade 1 diastolic dysfunction). No evidence of thrombus.

## 2013-11-17 NOTE — Patient Instructions (Addendum)
START PROTONIX 40 MG DAILY STOP PRILOSEC  INCREASE COREG TO 18.75 MG TWICE DAILY  You have been referred to [facility]  Your physician recommends that you schedule a follow-up appointment in: [address] [place]  Your physician has requested that you have an echocardiogram THIS CAN BE DONE THE SAME DAY YOU SEE DR. Tamala Julian PER MICHELLE LENZE, PAC. ur heart. It provides your doctor with information about the size and shape of your heart and how well your heart's chambers and valves are working. This procedure takes approximately one hour. There are no restrictions for this procedure.

## 2013-11-17 NOTE — Assessment & Plan Note (Signed)
Followed by Dr. Brigitte Pulse

## 2013-11-17 NOTE — Assessment & Plan Note (Signed)
Recommend follow up with GI 

## 2013-11-17 NOTE — Assessment & Plan Note (Addendum)
Patient is one month out from her MI. She is doing well without chest pain or evidence of heart failure. Her renal insufficiency and anemia is being followed by her primary care Dr. Brigitte Pulse. Recommend starting cardiac rehabilitation. Continue low sodium low fat diet. Followup 2-D echo to reassess LV function in 2 months. Increase carvedilol to 12.5 mg twice a day. Stopped Prilosec and begin protonix. We'll give samples of Crestor if available.

## 2013-12-01 ENCOUNTER — Encounter: Payer: Medicare Other | Admitting: Physician Assistant

## 2013-12-05 ENCOUNTER — Ambulatory Visit (INDEPENDENT_AMBULATORY_CARE_PROVIDER_SITE_OTHER): Payer: Medicare Other | Admitting: Ophthalmology

## 2013-12-08 ENCOUNTER — Ambulatory Visit (HOSPITAL_COMMUNITY): Payer: Medicare Other

## 2013-12-08 ENCOUNTER — Ambulatory Visit: Payer: Medicare Other | Admitting: Internal Medicine

## 2013-12-09 ENCOUNTER — Ambulatory Visit: Payer: Medicare Other | Admitting: Internal Medicine

## 2013-12-12 ENCOUNTER — Encounter (HOSPITAL_COMMUNITY): Payer: Medicare Other

## 2013-12-14 ENCOUNTER — Encounter (HOSPITAL_COMMUNITY): Payer: Medicare Other

## 2013-12-15 ENCOUNTER — Encounter (HOSPITAL_COMMUNITY)
Admission: RE | Admit: 2013-12-15 | Discharge: 2013-12-15 | Disposition: A | Discharge status: Home / Self Care | Payer: Medicare Other | Source: Ambulatory Visit | Attending: Interventional Cardiology | Admitting: Interventional Cardiology

## 2013-12-15 DIAGNOSIS — I252 Old myocardial infarction: Secondary | ICD-10-CM | POA: Insufficient documentation

## 2013-12-15 DIAGNOSIS — Z5189 Encounter for other specified aftercare: Secondary | ICD-10-CM | POA: Insufficient documentation

## 2013-12-15 DIAGNOSIS — I2510 Atherosclerotic heart disease of native coronary artery without angina pectoris: Secondary | ICD-10-CM | POA: Insufficient documentation

## 2013-12-15 NOTE — Progress Notes (Signed)
Cardiac Rehab Medication Review by a Pharmacist  Does the patient  feel that his/her medications are working for him/her?  yes  Has the patient been experiencing any side effects to the medications prescribed?  Yes, initial HA and occasional light-headedness  Does the patient measure his/her own blood pressure or blood glucose at home?  Yes, glucose regularly and occasional BP  Does the patient have any problems obtaining medications due to transportation or finances?   no  Understanding of regimen: good Understanding of indications: good Potential of compliance: good    Pharmacist comments: Ms. Fiorillo is a pleasant and talkative 63 yo F presenting for cardiac rehab.  She started the interview wanting to know more about the interaction between her Plavix and Prilosec.  The physician had recently switched her to Protonix d/t the potential for interaction.  I explained the reasoning's behind this and she verbalized understanding.  When she started taking more meds after her cardiac event, she initially had a headache which scared her, but has since resolved.  She thinks this may have been d/t the Coreg.  Ms. Uriegas occasionally gets light-headed and lays down to prevent falling.  She had to take one NTG tablet since her event and had relief of CP, but did have the HA known to be caused by the medication.  Since starting Lantus basal insulin, she was scared by a couple low BG readings at night (50 and 64).  She has adjusted her mealtime doses to 18 units with each meal instead of the 20 for regular meals and 25 for large meals previously administered.  The patient checks her BG five times a day and only occassionally checks her BP at home.  Her allergies listed are more intolerances, and she reports no anaphylactic reactions to medications.    Renelda Mom Jacqlyn Larsen, PharmD Clinical Pharmacist - Resident Pager: [phone] Pharmacy: [phone] 12/15/2013 8:33 AM

## 2013-12-16 ENCOUNTER — Encounter (HOSPITAL_COMMUNITY): Payer: Medicare Other

## 2013-12-19 ENCOUNTER — Telehealth (HOSPITAL_COMMUNITY): Payer: Self-pay | Admitting: *Deleted

## 2013-12-19 ENCOUNTER — Encounter (HOSPITAL_COMMUNITY): Admission: RE | Admit: 2013-12-19 | Payer: Medicare Other | Source: Ambulatory Visit

## 2013-12-21 ENCOUNTER — Encounter (HOSPITAL_COMMUNITY): Payer: Medicare Other

## 2013-12-23 ENCOUNTER — Encounter (HOSPITAL_COMMUNITY): Payer: Medicare Other

## 2013-12-26 ENCOUNTER — Encounter (HOSPITAL_COMMUNITY): Payer: Self-pay

## 2013-12-26 ENCOUNTER — Encounter (HOSPITAL_COMMUNITY)
Admission: RE | Admit: 2013-12-26 | Discharge: 2013-12-26 | Disposition: A | Discharge status: Home / Self Care | Payer: Medicare Other | Source: Ambulatory Visit | Attending: Interventional Cardiology | Admitting: Interventional Cardiology

## 2013-12-26 DIAGNOSIS — I252 Old myocardial infarction: Secondary | ICD-10-CM | POA: Diagnosis not present

## 2013-12-26 DIAGNOSIS — Z5189 Encounter for other specified aftercare: Secondary | ICD-10-CM | POA: Diagnosis not present

## 2013-12-26 DIAGNOSIS — I2510 Atherosclerotic heart disease of native coronary artery without angina pectoris: Secondary | ICD-10-CM | POA: Diagnosis not present

## 2013-12-26 NOTE — Progress Notes (Signed)
Pt started cardiac rehab today.  Pt tolerated light exercise without difficulty.  VSS,telemetry-NSR, rare PVC, asymptomatic.  PHQ-1.  Pt discussed feelings of anxiety and worry r/t her recent cardiac event.  Pt offered reassurance and emotional support.  Pt also offered counseling with Jeanella Craze, chaplain pt will consider and let us know if she is interested to schedule.  Pt exhibits no barriers to rehab participation and has positive outlook with good coping skills.  Pt however is concerned that she may not live as long as she had expected.  Pt short term goal is to increase her stamina and strength. Pt long term goal is to walk longer distances and be able to take her grandchildren to [employer].  Pt oriented to exercise equipment and routine.  Understanding verbalized.

## 2013-12-28 ENCOUNTER — Encounter (HOSPITAL_COMMUNITY): Payer: Medicare Other

## 2013-12-28 ENCOUNTER — Telehealth (HOSPITAL_COMMUNITY): Payer: Self-pay | Admitting: Internal Medicine

## 2013-12-30 ENCOUNTER — Encounter (HOSPITAL_COMMUNITY)
Admission: RE | Admit: 2013-12-30 | Discharge: 2013-12-30 | Disposition: A | Discharge status: Home / Self Care | Payer: Medicare Other | Source: Ambulatory Visit | Attending: Interventional Cardiology | Admitting: Interventional Cardiology

## 2013-12-30 DIAGNOSIS — Z5189 Encounter for other specified aftercare: Secondary | ICD-10-CM | POA: Diagnosis not present

## 2013-12-30 NOTE — Progress Notes (Signed)
PSYCHOSOCIAL ASSESSMENT  Pt psychosocial assessment reveals no barriers to rehab participation.  Pt quality of life is slightly altered by her physical constraints which limits her ability to perform tasks as prior to her illness.   Pt has decreased energy with increased fatigue.  Pt reports she takes frequent rest breaks and is "wiped out" for a few days after heavy activity such as caring for her grandchildren.   Pt has difficulty with housework, however denies dyspnea with ADL's.   Pt exhibits positive coping skills and has supportive family.  Offered emotional support and reassurance.  Will continue to monitor.

## 2014-01-02 ENCOUNTER — Encounter (HOSPITAL_COMMUNITY)
Admission: RE | Admit: 2014-01-02 | Discharge: 2014-01-02 | Disposition: A | Discharge status: Home / Self Care | Payer: Medicare Other | Source: Ambulatory Visit | Attending: Interventional Cardiology | Admitting: Interventional Cardiology

## 2014-01-02 DIAGNOSIS — Z5189 Encounter for other specified aftercare: Secondary | ICD-10-CM | POA: Diagnosis not present

## 2014-01-02 NOTE — Progress Notes (Signed)
Danielle Vargas 63 y.o. female Nutrition Note Spoke with pt. Nutrition Plan and Nutrition Survey goals reviewed with pt. Pt is following Step 2 of the Therapeutic Lifestyle Changes diet. Pt wants to lose wt. Pt has been trying to lose wt by watching her portion sizes, decreasing her amount of saturated fat and trans fats intake, and decreasing her added sugar intake. Wt loss tips reviewed. Pt reports she has been working on decreasing her amount of salt intake. Pt was encouraged not to add extra salt to food. Pt reports she is probably not meeting the recommended servings of fruit intake a day. Pt was educated on ways she can increase her fruit intake. Pt is diabetic. Pt reports checking her blood glucose 5 times a day (before meals and sometimes after meals). Pt cannot recall her usual fasting blood glucose, but reports the average blood glucose measured off of her meter is 171 mg/dL. Pt does report her blood glucose to be above 200 mg/dL sometimes. Pt was educated on the importance of watching her serving sizes and to keep her carbohydrate intake at all meals consistent. This Probation officer went over Diabetes Education test results. Pt expressed understanding of the information reviewed. Pt aware of nutrition education classes offered and plans on attending nutrition classes.  Nutrition Diagnosis   Food-and nutrition-related knowledge deficit related to lack of exposure to information as related to diagnosis of: ? CVD ? DM (A1c 7.1)    Obesity related to excessive energy intake as evidenced by a BMI of 39.4  Nutrition RX/ Estimated Daily Nutrition Needs for: wt loss 1200-1700 Kcal, 30-45 gm fat, 7-13 gm sat fat, 1.1-1.7 gm trans-fat, <1500 mg sodium, 150-175 gm CHO   Nutrition Intervention   Pt's individual nutrition plan reviewed with pt.   Benefits of adopting Therapeutic Lifestyle Changes discussed when Medficts reviewed.   Pt to attend the Portion Distortion class   Pt to attend the  ? Nutrition I class                      ? Nutrition II class        ? Diabetes Blitz class       ? Diabetes Q & A class   Continue client-centered nutrition education by RD, as part of interdisciplinary care.  Goal(s)   Pt to describe the potential benefits of adopting Therapeutic Lifestyle Changes   Pt to identify food quantities necessary to achieve: ? wt loss to a goal wt of 184-202 lb (83.7-91.9 kg) at graduation from cardiac rehab.    CBG concentrations in the normal range or as close to normal as is safely possible.  Monitor and Evaluate progress toward nutrition goal with team. Nutrition Risk: Change to Moderate  Kallie Locks Dietetic Intern  01/02/2014 3:11 PM  Derek Mound, M.Ed, RD, LDN, CDE 01/02/2014 3:25 PM

## 2014-01-04 ENCOUNTER — Encounter (HOSPITAL_COMMUNITY)
Admission: RE | Admit: 2014-01-04 | Discharge: 2014-01-04 | Disposition: A | Discharge status: Home / Self Care | Payer: Medicare Other | Source: Ambulatory Visit | Attending: Interventional Cardiology | Admitting: Interventional Cardiology

## 2014-01-04 DIAGNOSIS — Z5189 Encounter for other specified aftercare: Secondary | ICD-10-CM | POA: Diagnosis not present

## 2014-01-06 ENCOUNTER — Encounter (HOSPITAL_COMMUNITY): Admission: RE | Admit: 2014-01-06 | Payer: Medicare Other | Source: Ambulatory Visit

## 2014-01-09 ENCOUNTER — Encounter (HOSPITAL_COMMUNITY): Payer: Medicare Other

## 2014-01-10 ENCOUNTER — Ambulatory Visit (INDEPENDENT_AMBULATORY_CARE_PROVIDER_SITE_OTHER): Payer: Medicare Other | Admitting: Internal Medicine

## 2014-01-10 ENCOUNTER — Encounter: Payer: Self-pay | Admitting: Internal Medicine

## 2014-01-10 VITALS — BP 100/58 | HR 77 | Temp 98.1°F | Resp 12 | Wt 207.0 lb

## 2014-01-10 DIAGNOSIS — E119 Type 2 diabetes mellitus without complications: Secondary | ICD-10-CM

## 2014-01-10 DIAGNOSIS — E139 Other specified diabetes mellitus without complications: Secondary | ICD-10-CM

## 2014-01-10 NOTE — Progress Notes (Signed)
Patient ID: Danielle Vargas, female   DOB: [birth year], 63 y.o.   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 Last visit 2 mo ago.  Last hemoglobin A1c was:  Lab Results  Component Value Date   HGBA1C 7.1* 10/26/2013  Prev. 7.9%, 07/07/2013. Previously lower.  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  Previous regimen: - Metformin 1000 mg bid  - U500 0.11-0.11-0.10 units before meals >> 0.09 mL tid  She has been on Glipizide in the past, too. She tried Invokana in the past >> severe diarrhea  She is now on: Lantus 60 units at bedtime. Novolin R U100: - 22 units 3x a day  Pt checks her sugars 5-6x a day and they are higher - brings a detailed log: - am: 84-151 (247 highest) >>99-248, with 2 sugars at 59 >> 122-188 >> 96-194 (104-130) >> 133-261 - 2h after b'fast: 120-257 >> 163-271 >> 155-191 >> 74-188 >> 109-244 - before lunch: 128-290 >> 113-257 >> 77-141 >> 68-166 >>107-236 - 2h after lunch: 92-248 >> 200-266 >> 129-143 >> 107-184 (highest 243) >> 82-238 - before dinner: 141-245 >> 141-264 >> 64-108 >> 95-196 >> 107-256 - 2h after dinner: 153-224 >> 110-298 >> 180-209 >> 149-197 >> 68-265 - bedtime: 95-276 >> 88-266 >> 127-204 >> 94-209 >> 91-330 - nighttime: 161-310 >> n/c >> 75-232 >> 136-291 >> n/c  She has lows - lowest 68 x 1; she has hypoglycemia awareness at 95.  Highest sugar was 330.  Pt's meals are: - Breakfast: cheese toast + diet coke >> egg beater + toast + grits + butter + bacon + juice - Lunch: sandwich;  salad + cheese + croutons;  baked potato >> pinto beans + cornbread - Dinner: leftovers or corn bread + pinto beans + occas. Cole slaw >> salad + lite dressing + saltines - Snacks: no Not soft drinks anymore.  - has mild CKD, last BUN/creatinine:  Lab  Results  Component Value Date   BUN 27* 10/29/2013   CREATININE 1.00 10/29/2013  She is back on Losartan. - pt has HL:  Lab Results  Component Value Date   CHOL 115 10/27/2013   HDL 21* 10/27/2013   LDLCALC 47 10/27/2013   TRIG 236* 10/27/2013   CHOLHDL 5.5 10/27/2013  She is on Fenofibrate. - last eye exam was in 04/2013 - Dr Zigmund Daniel. R>L DR.  - + numbness and tingling in her feet. On neurontin.  I reviewed her chart and she also has a history of HTN, HL, fatty liver, GERD, morbid obesity, hypothyroidism, migraines, chronic back pain, history of nephrolithiasis, osteoporosis, diverticulosis. She had an AMI 10/26/2013 - had angioplasty, no stent placed.  I reviewed pt's medications, allergies, PMH, social hx, family hx and no changes required, except as mentioned above.  ROS: Constitutional:+ weight loss, + fatigue, no subjective hyperthermia/hypothermia Eyes:no blurry vision, no xerophthalmia ENT: + sore throat, no nodules palpated in throat, no dysphagia/odynophagia, no hoarseness Cardiovascular: no CP/SOB/ palpitations/leg swelling Respiratory: no cough/SOB/ wheezing Gastrointestinal: no N/V/D/C Musculoskeletal: no muscle/ joint pain Skin: no rashes, + hair loss Neurological: no tremors/numbness/tingling/dizziness  PE: BP 100/58  Pulse 77  Temp(Src) 98.1 F (36.7 C) (Oral)  Resp 12  Wt 207 lb (93.895 kg)  SpO2 97% Wt Readings from Last 3 Encounters:  01/10/14 207 lb (93.895 kg)  12/15/13 208 lb 8.9 oz (94.6 kg)  11/17/13 208 lb (94.348 kg)   Constitutional: overweight, in NAD Eyes: PERRLA, EOMI, no exophthalmos ENT: moist mucous membranes, no thyromegaly, no cervical lymphadenopathy Cardiovascular: RRR, No MRG Respiratory: CTA B Gastrointestinal: abdomen soft, NT, ND, BS+ Musculoskeletal: no deformities, strength intact in all 4 Skin: moist, warm, no rashes  ASSESSMENT: 1. DM2, insulin-dependent, uncontrolled, with complications - CAD - AMI 10/26/2013 - had  angioplasty, no stent placed. - peripheral neuropathy, on Neurontin - Gastroparesis, on Reglan - Dr. 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 Patient with long-standing, diabetes - worse control in last 2 mo  - We discussed about options for treatment, and I suggested to:    Patient Instructions  Please continue Lantus 60 units at bedtime. Please change the Regular insulin as follows: - 22 units with a regular meal - 26 units with a large meal  Please return in 1 month with your sugar log.   - continue checking sugars at different times of the day and write them down - she is doing an exemplary job with this - Return to clinic in 1 mo with sugar log >> will check A1c then

## 2014-01-10 NOTE — Patient Instructions (Signed)
Please continue Lantus 60 units at bedtime. Please change the Regular insulin as follows: - 22 units with a regular meal - 26 units with a large meal  Please return in 1 month with your sugar log.

## 2014-01-11 ENCOUNTER — Ambulatory Visit (INDEPENDENT_AMBULATORY_CARE_PROVIDER_SITE_OTHER): Payer: Medicare Other | Admitting: Ophthalmology

## 2014-01-11 ENCOUNTER — Encounter (HOSPITAL_COMMUNITY): Payer: Medicare Other

## 2014-01-11 DIAGNOSIS — E11319 Type 2 diabetes mellitus with unspecified diabetic retinopathy without macular edema: Secondary | ICD-10-CM

## 2014-01-11 DIAGNOSIS — H43819 Vitreous degeneration, unspecified eye: Secondary | ICD-10-CM

## 2014-01-11 DIAGNOSIS — E1165 Type 2 diabetes mellitus with hyperglycemia: Secondary | ICD-10-CM

## 2014-01-11 DIAGNOSIS — E1139 Type 2 diabetes mellitus with other diabetic ophthalmic complication: Secondary | ICD-10-CM

## 2014-01-11 DIAGNOSIS — H35039 Hypertensive retinopathy, unspecified eye: Secondary | ICD-10-CM

## 2014-01-11 DIAGNOSIS — H2510 Age-related nuclear cataract, unspecified eye: Secondary | ICD-10-CM

## 2014-01-11 DIAGNOSIS — I10 Essential (primary) hypertension: Secondary | ICD-10-CM

## 2014-01-13 ENCOUNTER — Telehealth (HOSPITAL_COMMUNITY): Payer: Self-pay | Admitting: Cardiac Rehabilitation

## 2014-01-13 ENCOUNTER — Encounter (HOSPITAL_COMMUNITY): Payer: Medicare Other

## 2014-01-13 NOTE — Telephone Encounter (Signed)
Pc received from pt reporting she has been absent from cardiac rehab due to gastroparesis attack last week and now this week c/o back and shoulder pain.  Pt feels the joint pain is limiting her ability and she is concerned that exercise is exacerbating his pain. Offered light stretching exercises for back pain relief, however pt declined.  Pt has appointment 01/20/14 with pcp Dr Brigitte Pulse to discuss.   Pt reports she is eligible for silver sneakers so water aerobics may be possible.  However pt encouraged to discuss exercise options at this time with Dr. Brigitte Pulse for current flareup relief prior to starting new activity.  Pt advised to update with Dr. Brigitte Pulse recommendations.  Understanding verbalized

## 2014-01-16 ENCOUNTER — Encounter (HOSPITAL_COMMUNITY): Payer: Medicare Other

## 2014-01-18 ENCOUNTER — Encounter (HOSPITAL_COMMUNITY): Payer: Medicare Other

## 2014-01-18 ENCOUNTER — Encounter: Payer: Self-pay | Admitting: Interventional Cardiology

## 2014-01-20 ENCOUNTER — Encounter (HOSPITAL_COMMUNITY): Payer: Medicare Other

## 2014-01-23 ENCOUNTER — Telehealth (HOSPITAL_COMMUNITY): Payer: Self-pay | Admitting: Cardiac Rehabilitation

## 2014-01-23 ENCOUNTER — Encounter (HOSPITAL_COMMUNITY): Payer: Medicare Other

## 2014-01-23 ENCOUNTER — Ambulatory Visit (INDEPENDENT_AMBULATORY_CARE_PROVIDER_SITE_OTHER): Payer: Medicare Other | Admitting: Interventional Cardiology

## 2014-01-23 ENCOUNTER — Ambulatory Visit (HOSPITAL_COMMUNITY): Payer: Medicare Other | Attending: Cardiovascular Disease | Admitting: Cardiology

## 2014-01-23 ENCOUNTER — Encounter: Payer: Self-pay | Admitting: Interventional Cardiology

## 2014-01-23 VITALS — BP 122/70 | HR 72 | Ht 61.0 in | Wt 211.0 lb

## 2014-01-23 DIAGNOSIS — I2129 ST elevation (STEMI) myocardial infarction involving other sites: Secondary | ICD-10-CM

## 2014-01-23 DIAGNOSIS — I509 Heart failure, unspecified: Secondary | ICD-10-CM | POA: Insufficient documentation

## 2014-01-23 DIAGNOSIS — E785 Hyperlipidemia, unspecified: Secondary | ICD-10-CM | POA: Insufficient documentation

## 2014-01-23 DIAGNOSIS — I219 Acute myocardial infarction, unspecified: Secondary | ICD-10-CM

## 2014-01-23 DIAGNOSIS — I10 Essential (primary) hypertension: Secondary | ICD-10-CM | POA: Insufficient documentation

## 2014-01-23 DIAGNOSIS — I2121 ST elevation (STEMI) myocardial infarction involving left circumflex coronary artery: Secondary | ICD-10-CM

## 2014-01-23 DIAGNOSIS — I5041 Acute combined systolic (congestive) and diastolic (congestive) heart failure: Secondary | ICD-10-CM

## 2014-01-23 DIAGNOSIS — I2510 Atherosclerotic heart disease of native coronary artery without angina pectoris: Secondary | ICD-10-CM

## 2014-01-23 DIAGNOSIS — IMO0002 Reserved for concepts with insufficient information to code with codable children: Secondary | ICD-10-CM

## 2014-01-23 DIAGNOSIS — E119 Type 2 diabetes mellitus without complications: Secondary | ICD-10-CM | POA: Insufficient documentation

## 2014-01-23 NOTE — Progress Notes (Signed)
Patient ID: Danielle Vargas, female   DOB: April 28, 1951, 63 y.o.   MRN: [id number]    [address], [address] [place], [place]  [address] Phone: [phone] Fax:  [phone]  Date:  01/23/2014   ID:  Danielle Vargas, DOB 1951/05/31, MRN [id number]  PCP:  Marton Redwood, MD   ASSESSMENT:  1. Combined systolic and diastolic heart failure, during the most recent hospital stay. Today's echo documents improvement in LV function with an EF of 55%. 2. Coronary artery disease with angioplasty of obtuse marginal branch during acute MI 2 months ago. No anginal complaints. 3. Obesity and deconditioning 4. Diabetes mellitus  PLAN:  1. Check 2-D Doppler echocardiogram results (01/23/14 2-D Doppler echocardiogram demonstrated an LVEF of 55% without regional wall motion abnormality) 2. Further medication adjustments depending upon the echo results. We will continue Plavix for a total of 3 months. With LV recovery further titration of beta blocker therapy has not needed 3. Physical activity as tolerated. 4. I encouraged weight loss, increased aerobic activity, and risk factor modification as tolerated.   SUBJECTIVE: Danielle Vargas is a 63 y.o. female who had a lateral wall infarct and treated with angioplasty. She has exertional dyspnea is her only complaint. She has not been able to participate in cardiac rehabilitation because of musculoskeletal complaints.   Wt Readings from Last 3 Encounters:  01/23/14 211 lb (95.709 kg)  01/10/14 207 lb (93.895 kg)  12/15/13 208 lb 8.9 oz (94.6 kg)     Past Medical History  Diagnosis Date  . DM (diabetes mellitus)   . Obesity   . Dyslipidemia   . HTN (hypertension)   . Hypothyroidism   . Gastroparesis   . Migraine   . Osteoporosis   . Colon polyps 7/07    hyperplastic and adenomatous  . Depression   . Nephrolithiasis   . Diverticulosis   . External hemorrhoid   . Fatty liver   . Asthma   . CAD (coronary artery disease)     a. 12/2008 Cath:  mild irregs throughout, EF 75%.    Current Outpatient Prescriptions  Medication Sig Dispense Refill  . albuterol (PROVENTIL HFA;VENTOLIN HFA) 108 (90 BASE) MCG/ACT inhaler Inhale 2 puffs into the lungs every 6 (six) hours as needed. For shortness of breath.      Marland Kitchen amLODipine (NORVASC) 5 MG tablet Take 5 mg by mouth every morning.       Marland Kitchen aspirin EC 81 MG tablet Take 81 mg by mouth every morning.      . calcium-vitamin D (CALCIUM 500/D) 500-200 MG-UNIT per tablet Take 1 tablet by mouth every evening.       . carisoprodol (SOMA) 350 MG tablet Take 350 mg by mouth 4 (four) times daily.       . carvedilol (COREG) 12.5 MG tablet Take 1.5 tablets (18.75 mg total) by mouth 2 (two) times daily with a meal.  540 tablet  3  . clopidogrel (PLAVIX) 75 MG tablet Take 1 tablet (75 mg total) by mouth daily with breakfast.  30 tablet  11  . Coenzyme Q10 (CO Q 10) 100 MG CAPS Take 1 tablet by mouth 2 (two) times daily.       . fenofibrate 160 MG tablet Take 160 mg by mouth at bedtime.       . furosemide (LASIX) 40 MG tablet Take 40 mg by mouth every morning.      . gabapentin (NEURONTIN) 300 MG capsule Take 300 mg by mouth 3 (three)  times daily.      Marland Kitchen HYDROcodone-acetaminophen (NORCO) 5-325 MG per tablet Take 1 tablet by mouth every 6 (six) hours as needed. For joint and back pain.      Marland Kitchen insulin glargine (LANTUS) 100 UNIT/ML injection Inject 0.6 mLs (60 Units total) into the skin at bedtime.  15 mL  0  . Insulin Pen Needle (FIFTY50 PEN NEEDLES) 31G X 5 MM MISC Use 4x a day  100 each  prn  . insulin regular (NOVOLIN R,HUMULIN R) 100 units/mL injection Inject 22-26 Units into the skin 3 (three) times daily before meals.       Marland Kitchen levothyroxine (SYNTHROID, LEVOTHROID) 75 MCG tablet Take 75 mcg by mouth daily before breakfast.       . metFORMIN (GLUCOPHAGE) 1000 MG tablet Take 1,000 mg by mouth 2 (two) times daily with a meal.      . metoCLOPramide (REGLAN) 10 MG tablet Take 10 mg by mouth 4 (four) times daily.        . montelukast (SINGULAIR) 10 MG tablet Take 10 mg by mouth at bedtime.      . nitroGLYCERIN (NITROSTAT) 0.4 MG SL tablet Place 1 tablet (0.4 mg total) under the tongue every 5 (five) minutes as needed for chest pain.  25 tablet  3  . pantoprazole (PROTONIX) 40 MG tablet Take 1 tablet (40 mg total) by mouth daily.  30 tablet  1  . polyethylene glycol (MIRALAX / GLYCOLAX) packet Take 17 g by mouth daily.      . rosuvastatin (CRESTOR) 40 MG tablet Take 0.5 tablets (20 mg total) by mouth daily.  90 tablet  3  . topiramate (TOPAMAX) 50 MG tablet Take 50 mg by mouth 2 (two) times daily.      . Vitamin D, Ergocalciferol, (DRISDOL) 50000 UNITS CAPS Take 50,000 Units by mouth every 7 (seven) days. Take on thursdays       No current facility-administered medications for this visit.    Allergies:    Allergies  Allergen Reactions  . Invokana [Canagliflozin] Diarrhea  . Nsaids Other (See Comments)    GI problems    Social History:  The patient  reports that she has never smoked. She has never used smokeless tobacco. She reports that she does not drink alcohol or use illicit drugs.   ROS:  Please see the history of present illness.   Participation in cardiac rehabilitation activated musculoskeletal discomfort as she could not tolerate, therefore resulting in discontinuation   All other systems reviewed and negative.   OBJECTIVE: VS:  BP 122/70  Pulse 72  Ht 5\' 1"  (1.549 m)  Wt 211 lb (95.709 kg)  BMI 39.89 kg/m2 Well nourished, well developed, in no acute distress, markedly obese especially abdominal/truncal HEENT: normal Neck: JVD flat. Carotid bruit absent  Cardiac:  normal S1, S2; RRR; no murmur Lungs:  clear to auscultation bilaterally, no wheezing, rhonchi or rales Abd: soft, nontender, no hepatomegaly Ext: Edema absent. Pulses 2+ Skin: warm and dry Neuro:  CNs 2-12 intact, no focal abnormalities noted  EKG:  No new tracing was performed     ECHO, 01/23/2014: Study Conclusions  -  Left ventricle: Poor qualit images no subcostal images. No color flow or M mode. Overall EF appears normal no obvious discrete RWMA. The cavity size was normal. Wall thickness was increased in a pattern of moderate LVH. The estimated ejection fraction was 55%. - Mitral valve: No color flow done - Pericardium, extracardiac: Small pericardial effusion mostly posterior to  the heart     Signed, Illene Labrador III, MD 01/23/2014 3:51 PM

## 2014-01-23 NOTE — Patient Instructions (Signed)
Your physician recommends that you continue on your current medications as directed. Please refer to the Current Medication list given to you today.  We will call you with your echo results  You have a follow up appt scheduled for 03/27/14 @2  pm

## 2014-01-23 NOTE — Telephone Encounter (Signed)
pc to pt to assess continued absence from cardiac rehab. Pt states she was evaluated by PCP Dr Brigitte Pulse on Friday who advised pt to discontinue cardiac rehab due to persistent shoulder and back pain. Pt has upcoming appt with orthopaedic for further treatment.  Pt reports she also discussed depression symptoms with PCP Dr. Brigitte Pulse who has recommended pt follow up with psychologist in [place].pt states she will call to schedule these appointments.   Pt plans to participate in water aerobics.  Dr. Tamala Julian made aware.

## 2014-01-23 NOTE — Progress Notes (Signed)
Limited echo performed. 

## 2014-01-24 ENCOUNTER — Other Ambulatory Visit: Payer: Self-pay | Admitting: *Deleted

## 2014-01-24 MED ORDER — INSULIN REGULAR HUMAN 100 UNIT/ML IJ SOLN: INTRAMUSCULAR | Status: DC

## 2014-01-25 ENCOUNTER — Telehealth: Payer: Self-pay | Admitting: Internal Medicine

## 2014-01-25 ENCOUNTER — Encounter (HOSPITAL_COMMUNITY): Payer: Medicare Other

## 2014-01-25 ENCOUNTER — Other Ambulatory Visit: Payer: Self-pay | Admitting: Endocrinology

## 2014-01-25 NOTE — Telephone Encounter (Signed)
Patient states [facility] has been trying to contact regarding 2 rx Novolin and syringes Novolin 22-26 units at meal time  Patient states she is 26 units 3x daily   Please contact [facility] [phone]  Thank You :)

## 2014-01-26 ENCOUNTER — Other Ambulatory Visit: Payer: Self-pay | Admitting: *Deleted

## 2014-01-26 MED ORDER — INSULIN REGULAR HUMAN 100 UNIT/ML IJ SOLN: INTRAMUSCULAR | Status: DC

## 2014-01-26 MED ORDER — "INSULIN SYRINGE-NEEDLE U-100 31G X 5/16"" 1 ML MISC": Status: DC

## 2014-01-26 NOTE — Telephone Encounter (Signed)
New rx sent to [facility] for pt's Novolin insulin, 26 units 3x daily and insulin syringes. Called pt to advise this had been done. Pt understood.

## 2014-01-27 ENCOUNTER — Encounter (HOSPITAL_COMMUNITY): Payer: Medicare Other

## 2014-01-30 ENCOUNTER — Encounter (HOSPITAL_COMMUNITY): Payer: Medicare Other | Attending: Interventional Cardiology

## 2014-01-30 DIAGNOSIS — I2510 Atherosclerotic heart disease of native coronary artery without angina pectoris: Secondary | ICD-10-CM | POA: Insufficient documentation

## 2014-01-30 DIAGNOSIS — Z5189 Encounter for other specified aftercare: Secondary | ICD-10-CM | POA: Insufficient documentation

## 2014-01-30 DIAGNOSIS — I252 Old myocardial infarction: Secondary | ICD-10-CM | POA: Insufficient documentation

## 2014-02-01 ENCOUNTER — Encounter (HOSPITAL_COMMUNITY): Payer: Medicare Other

## 2014-02-01 ENCOUNTER — Telehealth: Payer: Self-pay | Admitting: Interventional Cardiology

## 2014-02-01 NOTE — Telephone Encounter (Signed)
New message ° ° ° ° ° °Want echo results °

## 2014-02-01 NOTE — Telephone Encounter (Signed)
Will route to Dr. Smith for review.  

## 2014-02-03 ENCOUNTER — Encounter (HOSPITAL_COMMUNITY): Payer: Medicare Other

## 2014-02-06 ENCOUNTER — Encounter: Payer: Self-pay | Admitting: Interventional Cardiology

## 2014-02-06 ENCOUNTER — Encounter (HOSPITAL_COMMUNITY): Payer: Medicare Other

## 2014-02-06 NOTE — Telephone Encounter (Signed)
Follow up ° ° ° ° °Pt want her echo results °

## 2014-02-07 ENCOUNTER — Other Ambulatory Visit: Payer: Self-pay

## 2014-02-07 MED ORDER — ROSUVASTATIN CALCIUM 20 MG PO TABS: 20.0000 mg | ORAL_TABLET | Freq: Every day | ORAL | Status: DC

## 2014-02-07 NOTE — Telephone Encounter (Signed)
pt given prelim echo results.adv her that Dr.Smith has not reviewed her echo results.adv her that I have fwd the results to him.I will call back as soon as he is able to review them .pt verbalized understanding.

## 2014-02-08 ENCOUNTER — Encounter (HOSPITAL_COMMUNITY): Payer: Medicare Other

## 2014-02-09 NOTE — Telephone Encounter (Signed)
The echo is okay. Normal function. Looks like she and I discussed during visit. Overall, very pleasing result.

## 2014-02-10 ENCOUNTER — Encounter (HOSPITAL_COMMUNITY): Payer: Medicare Other

## 2014-02-10 NOTE — Telephone Encounter (Signed)
pt awre echo reviewed by Dr.Smith.The echo is okay. Normal function. Looks like she and I discussed during visit. Overall, very pleasing result.pt verbalized undetstanding.

## 2014-02-13 ENCOUNTER — Encounter (HOSPITAL_COMMUNITY): Payer: Medicare Other

## 2014-02-14 ENCOUNTER — Encounter: Payer: Self-pay | Admitting: Internal Medicine

## 2014-02-14 ENCOUNTER — Ambulatory Visit (INDEPENDENT_AMBULATORY_CARE_PROVIDER_SITE_OTHER): Payer: Medicare Other | Admitting: Internal Medicine

## 2014-02-14 VITALS — BP 118/68 | HR 90 | Temp 97.9°F | Resp 16 | Wt 211.8 lb

## 2014-02-14 DIAGNOSIS — E139 Other specified diabetes mellitus without complications: Secondary | ICD-10-CM

## 2014-02-14 DIAGNOSIS — E119 Type 2 diabetes mellitus without complications: Secondary | ICD-10-CM

## 2014-02-14 DIAGNOSIS — I2510 Atherosclerotic heart disease of native coronary artery without angina pectoris: Secondary | ICD-10-CM

## 2014-02-14 NOTE — Patient Instructions (Signed)
Please continue Lantus 60 units at bedtime. Please continue the Regular insulin as follows: - 22 units with a regular meal - 26 units with a larger meal Add the following Sliding scale NovoLog insulin to the mealtime insulin: - 150- 165: + 1 unit  - 166- 180: + 2 units  - 181- 195: + 3 units  - 196- 210: + 4 units  - >210: + 5 units  Please return in 3 months with your sugar log.

## 2014-02-14 NOTE — Progress Notes (Signed)
Patient ID: Danielle Vargas, female   DOB: 04-21-1951, 63 y.o.   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 Last visit 1 mo ago.  Last hemoglobin A1c was:  01/18/2014: HbA1c 6.8% - per PCP Lab Results  Component Value Date   HGBA1C 7.1* 10/26/2013  Prev. 7.9%, 07/07/2013.  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  Previous regimen: - Metformin 1000 mg bid  - U500 0.11-0.11-0.10 units before meals >> 0.09 mL tid  She has been on Glipizide in the past, too. She tried Invokana in the past >> severe diarrhea  She is now on: Lantus 60 units at bedtime. Novolin R U100: - 22 units (small meal) and 26 units (larger meal - uses this mostly) 3x a day  Pt checks her sugars 5-6x a day - brings a detailed log: - am: 84-151 (247 highest) >>99-248, with 2 sugars at 59 >> 122-188 >> 96-194 (104-130) >> 133-261 >> 84-209 - 2h after b'fast: 120-257 >> 163-271 >> 155-191 >> 74-188 >> 109-244 >> 129-228 - before lunch: 128-290 >> 113-257 >> 77-141 >> 68-166 >>107-236 >> 74-227 - 2h after lunch: 92-248 >> 200-266 >> 129-143 >> 107-184 (highest 243) >> 82-238 >> 81-182 (2x: 244,273) - before dinner: 141-245 >> 141-264 >> 64-108 >> 95-196 >> 107-256 >> 71-178 (191) - 2h after dinner: 153-224 >> 110-298 >> 180-209 >> 149-197 >> 68-265 >> 174-267 - bedtime: 95-276 >> 88-266 >> 127-204 >> 94-209 >> 91-330 >> 87-230 - nighttime: 161-310 >> n/c >> 75-232 >> 136-291 >> n/c  She has lows - lowest 68 x 1; she has hypoglycemia awareness at 95.  Highest sugar was 267.  Pt's meals are: - Breakfast: cheese toast + diet coke >> egg beater + toast + grits + butter + bacon + juice - Lunch: sandwich;  salad + cheese + croutons;  baked potato >> pinto beans + cornbread - Dinner: leftovers or corn bread + pinto beans  + occas. Cole slaw >> salad + lite dressing + saltines - Snacks: no Not soft drinks anymore.  - has mild CKD, last BUN/creatinine:  Lab Results  Component Value Date   BUN 27* 10/29/2013   CREATININE 1.00 10/29/2013  She is back on Losartan. - pt has HL:  Lab Results  Component Value Date   CHOL 115 10/27/2013   HDL 21* 10/27/2013   LDLCALC 47 10/27/2013   TRIG 236* 10/27/2013   CHOLHDL 5.5 10/27/2013  She is on Fenofibrate. - last eye exam was in 01/2014 (unchanged) - Dr Zigmund Daniel. R>L DR.  - + numbness and tingling in her feet. On neurontin.  She also has a history of HTN, HL, fatty liver, GERD, morbid obesity, hypothyroidism, migraines, chronic back pain, history of nephrolithiasis, osteoporosis, diverticulosis.   I reviewed pt's medications, allergies, PMH, social hx, family hx and no changes required, except as mentioned above.  ROS: Constitutional: no weight gain/loss, no fatigue, no subjective hyperthermia/hypothermia Eyes: no blurry vision, no xerophthalmia ENT: no sore throat, no nodules palpated in throat, no dysphagia/odynophagia, no hoarseness Cardiovascular: no CP/SOB/palpitations/leg swelling Respiratory: no cough/SOB Gastrointestinal: no N/V/D/C Musculoskeletal: no muscle/joint aches Skin: no rashes Neurological: no tremors/numbness/tingling/dizziness  PE: BP 118/68  Pulse 90  Temp(Src) 97.9 F (36.6 C) (Oral)  Resp 16  Wt 211 lb 12.8 oz (96.072 kg)  SpO2 95% Wt  Readings from Last 3 Encounters:  02/14/14 211 lb 12.8 oz (96.072 kg)  01/23/14 211 lb (95.709 kg)  01/10/14 207 lb (93.895 kg)   Constitutional: overweight, in NAD Eyes: PERRLA, EOMI, no exophthalmos ENT: moist mucous membranes, no thyromegaly, no cervical lymphadenopathy Cardiovascular: RRR, No MRG Respiratory: CTA B Gastrointestinal: abdomen soft, NT, ND, BS+ Musculoskeletal: no deformities, strength intact in all 4 Skin: moist, warm, no rashes  ASSESSMENT: 1. DM2, insulin-dependent,  uncontrolled, with complications - CAD - AMI 10/26/2013 - had angioplasty, no stent placed. Had 2DEcho on 01/23/2014 EF increased (from 35) to 55%. - peripheral neuropathy, on Neurontin - Gastroparesis, on Reglan - Dr. 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 Patient with long-standing, diabetes - improved control in last 2 mo based on A1c and log, but sugars higher than I would expect from her HbA1c... At next visit will add a fructosamine level to the HbA1c check.  -  I suggested to add a SSI NovoLog:  Patient Instructions  Please continue Lantus 60 units at bedtime. Please continue the Regular insulin as follows: - 22 units with a regular meal - 26 units with a larger meal Add the following Sliding scale NovoLog insulin to the mealtime insulin: - 150- 165: + 1 unit  - 166- 180: + 2 units  - 181- 195: + 3 units  - 196- 210: + 4 units  - >210: + 5 units  Please return in 3 months with your sugar log. - continue checking sugars at different times of the day and write them down - she is doing a great job with this - Return to clinic in 52mo with sugar log >> will check A1c and fructosmaine then

## 2014-02-15 ENCOUNTER — Encounter (HOSPITAL_COMMUNITY): Payer: Medicare Other

## 2014-02-15 ENCOUNTER — Ambulatory Visit (INDEPENDENT_AMBULATORY_CARE_PROVIDER_SITE_OTHER): Payer: Medicare Other | Admitting: Licensed Clinical Social Worker

## 2014-02-15 DIAGNOSIS — F4320 Adjustment disorder, unspecified: Secondary | ICD-10-CM

## 2014-02-17 ENCOUNTER — Encounter (HOSPITAL_COMMUNITY): Payer: Medicare Other

## 2014-02-20 ENCOUNTER — Encounter (HOSPITAL_COMMUNITY): Payer: Medicare Other

## 2014-02-22 ENCOUNTER — Encounter (HOSPITAL_COMMUNITY): Payer: Medicare Other

## 2014-02-24 ENCOUNTER — Encounter (HOSPITAL_COMMUNITY): Payer: Medicare Other

## 2014-02-27 ENCOUNTER — Encounter (HOSPITAL_COMMUNITY): Payer: Medicare Other

## 2014-03-01 ENCOUNTER — Encounter (HOSPITAL_COMMUNITY): Payer: Medicare Other

## 2014-03-03 ENCOUNTER — Encounter (HOSPITAL_COMMUNITY): Payer: Medicare Other

## 2014-03-06 ENCOUNTER — Encounter (HOSPITAL_COMMUNITY): Payer: Medicare Other

## 2014-03-06 ENCOUNTER — Ambulatory Visit: Payer: Medicare Other | Admitting: Licensed Clinical Social Worker

## 2014-03-08 ENCOUNTER — Encounter (HOSPITAL_COMMUNITY): Payer: Medicare Other

## 2014-03-10 ENCOUNTER — Encounter (HOSPITAL_COMMUNITY): Payer: Medicare Other

## 2014-03-13 ENCOUNTER — Encounter (HOSPITAL_COMMUNITY): Payer: Medicare Other

## 2014-03-15 ENCOUNTER — Encounter (HOSPITAL_COMMUNITY): Payer: Medicare Other

## 2014-03-17 ENCOUNTER — Encounter (HOSPITAL_COMMUNITY): Payer: Medicare Other

## 2014-03-24 ENCOUNTER — Ambulatory Visit (INDEPENDENT_AMBULATORY_CARE_PROVIDER_SITE_OTHER): Payer: Medicare Other | Admitting: Licensed Clinical Social Worker

## 2014-03-24 DIAGNOSIS — F4320 Adjustment disorder, unspecified: Secondary | ICD-10-CM

## 2014-03-27 ENCOUNTER — Encounter: Payer: Self-pay | Admitting: Interventional Cardiology

## 2014-03-27 ENCOUNTER — Ambulatory Visit (INDEPENDENT_AMBULATORY_CARE_PROVIDER_SITE_OTHER): Payer: Medicare Other | Admitting: Interventional Cardiology

## 2014-03-27 VITALS — BP 101/44 | HR 77 | Ht 61.0 in | Wt 220.0 lb

## 2014-03-27 DIAGNOSIS — I5041 Acute combined systolic (congestive) and diastolic (congestive) heart failure: Secondary | ICD-10-CM

## 2014-03-27 DIAGNOSIS — I2510 Atherosclerotic heart disease of native coronary artery without angina pectoris: Secondary | ICD-10-CM

## 2014-03-27 DIAGNOSIS — I252 Old myocardial infarction: Secondary | ICD-10-CM

## 2014-03-27 DIAGNOSIS — I10 Essential (primary) hypertension: Secondary | ICD-10-CM

## 2014-03-27 DIAGNOSIS — N289 Disorder of kidney and ureter, unspecified: Secondary | ICD-10-CM

## 2014-03-27 NOTE — Patient Instructions (Signed)
Your physician recommends that you continue on your current medications as directed. Please refer to the Current Medication list given to you today.  Your physician wants you to follow-up in: 6 months with Dr.Smith You will receive a reminder letter in the mail two months in advance. If you don't receive a letter, please call our office to schedule the follow-up appointment.  

## 2014-03-27 NOTE — Progress Notes (Signed)
Patient ID: Jhene Vargas, female   DOB: 1951/08/18, 63 y.o.   MRN: [id number]    [address] [place], [place]  [address] Phone: [phone] Fax:  [phone]  Date:  03/27/2014   ID:  Danielle Vargas, DOB 10-24-1950, MRN [id number]  PCP:  Marton Redwood, MD   ASSESSMENT:  1. Coronary artery disease without angina since myocardial infarction in February 2. Obesity, unchanged 3. Hypertension, controlled 3. Hyperlipidemia   PLAN:  1. aerobic activity as tolerated 2. No change in medical regimen 3. Clinical followup in 6 months at which time Plavix will be discontinued assuming no intercurrent cardiac events   SUBJECTIVE: Danielle Vargas is a 63 y.o. female who is doing well and denies angina. She has stopped cardiac rehabilitation because orthopedic issues. She denies orthopnea, PND, and edema. No palpitations.   Wt Readings from Last 3 Encounters:  03/27/14 220 lb (99.791 kg)  02/14/14 211 lb 12.8 oz (96.072 kg)  01/23/14 211 lb (95.709 kg)     Past Medical History  Diagnosis Date  . DM (diabetes mellitus)   . Obesity   . Dyslipidemia   . HTN (hypertension)   . Hypothyroidism   . Gastroparesis   . Migraine   . Osteoporosis   . Colon polyps 7/07    hyperplastic and adenomatous  . Depression   . Nephrolithiasis   . Diverticulosis   . External hemorrhoid   . Fatty liver   . Asthma   . CAD (coronary artery disease)     a. 12/2008 Cath: mild irregs throughout, EF 75%.    Current Outpatient Prescriptions  Medication Sig Dispense Refill  . albuterol (PROVENTIL HFA;VENTOLIN HFA) 108 (90 BASE) MCG/ACT inhaler Inhale 2 puffs into the lungs every 6 (six) hours as needed. For shortness of breath.      Marland Kitchen amLODipine (NORVASC) 5 MG tablet Take 5 mg by mouth every morning.       Marland Kitchen aspirin EC 81 MG tablet Take 81 mg by mouth every morning.      . calcium-vitamin D (CALCIUM 500/D) 500-200 MG-UNIT per tablet Take 1 tablet by mouth every evening.       .  carisoprodol (SOMA) 350 MG tablet Take 350 mg by mouth 4 (four) times daily.       . carvedilol (COREG) 12.5 MG tablet Take 1.5 tablets (18.75 mg total) by mouth 2 (two) times daily with a meal.  540 tablet  3  . clopidogrel (PLAVIX) 75 MG tablet Take 1 tablet (75 mg total) by mouth daily with breakfast.  30 tablet  11  . Coenzyme Q10 (CO Q 10) 100 MG CAPS Take 1 tablet by mouth 2 (two) times daily.       . fenofibrate 160 MG tablet Take 160 mg by mouth at bedtime.       . furosemide (LASIX) 40 MG tablet Take 40 mg by mouth every morning.      . gabapentin (NEURONTIN) 300 MG capsule Take 300 mg by mouth 3 (three) times daily.      Marland Kitchen HYDROcodone-acetaminophen (NORCO) 5-325 MG per tablet Take 1 tablet by mouth every 6 (six) hours as needed. For joint and back pain.      Marland Kitchen insulin glargine (LANTUS) 100 UNIT/ML injection Inject 0.6 mLs (60 Units total) into the skin at bedtime.  15 mL  0  . Insulin Pen Needle (FIFTY50 PEN NEEDLES) 31G X 5 MM MISC Use 4x a day  100 each  prn  .  insulin regular (NOVOLIN R,HUMULIN R) 100 units/mL injection Inject 26 units into the skin 3 times daily.  70 mL  1  . Insulin Syringe-Needle U-100 (BD INSULIN SYRINGE ULTRAFINE) 31G X 5/16" 1 ML MISC USE TO INJECT INSULIN 4 TIMES DAILY AS INSTRUCTED.  400 each  3  . levothyroxine (SYNTHROID, LEVOTHROID) 75 MCG tablet Take 75 mcg by mouth daily before breakfast.       . losartan (COZAAR) 50 MG tablet       . metFORMIN (GLUCOPHAGE) 1000 MG tablet Take 1,000 mg by mouth 2 (two) times daily with a meal.      . metoCLOPramide (REGLAN) 10 MG tablet Take 10 mg by mouth 4 (four) times daily.       . montelukast (SINGULAIR) 10 MG tablet Take 10 mg by mouth at bedtime.      . nitroGLYCERIN (NITROSTAT) 0.4 MG SL tablet Place 1 tablet (0.4 mg total) under the tongue every 5 (five) minutes as needed for chest pain.  25 tablet  3  . pantoprazole (PROTONIX) 40 MG tablet Take 1 tablet (40 mg total) by mouth daily.  30 tablet  1  . polyethylene  glycol (MIRALAX / GLYCOLAX) packet Take 17 g by mouth daily.      . rosuvastatin (CRESTOR) 20 MG tablet Take 1 tablet (20 mg total) by mouth daily.  90 tablet  1  . topiramate (TOPAMAX) 50 MG tablet Take 50 mg by mouth 2 (two) times daily.      . Vitamin D, Ergocalciferol, (DRISDOL) 50000 UNITS CAPS Take 50,000 Units by mouth every 7 (seven) days. Take on thursdays       No current facility-administered medications for this visit.    Allergies:    Allergies  Allergen Reactions  . Invokana [Canagliflozin] Diarrhea  . Nsaids Other (See Comments)    GI problems    Social History:  The patient  reports that she has never smoked. She has never used smokeless tobacco. She reports that she does not drink alcohol or use illicit drugs.   ROS:  Please see the history of present illness.   Denies transient neurological events. No claudication. No peripheral edema.   All other systems reviewed and negative.   OBJECTIVE: VS:  BP 101/44  Pulse 77  Ht 5\' 1"  (1.549 m)  Wt 220 lb (99.791 kg)  BMI 41.59 kg/m2 Well nourished, well developed, in no acute distress, obese  HEENT: normal Neck: JVD flat. Carotid bruit absent  Cardiac:  normal S1, S2; RRR; no murmur Lungs:  clear to auscultation bilaterally, no wheezing, rhonchi or rales Abd: soft, nontender, no hepatomegaly Ext: Edema absent. Pulses 2+  Skin: warm and dry Neuro:  CNs 2-12 intact, no focal abnormalities noted  EKG:  Not repeat       Signed, Illene Labrador III, MD 03/27/2014 2:18 PM

## 2014-03-30 ENCOUNTER — Telehealth: Payer: Self-pay | Admitting: Interventional Cardiology

## 2014-03-30 NOTE — Telephone Encounter (Signed)
New message      Pt is have low bp issues.  It is making her feel weak and shaky.  She just saw Dr Tamala Julian on Monday.  She thinks she may need to stop taking her bp medication.  Please advise

## 2014-03-30 NOTE — Telephone Encounter (Signed)
pt sts that she had and episode of weakness earlier today, pt denies dizziness, palpitations, syncope that she thought was related to her bp.pt sts that her bp has been running in the 100's/50's but she has been asymptomatic.pt is a diabetic, she sts that she checks her blood sugars 5 x's daily.pt st that after eating lunch she feels a lot better and thinks that what she was feeling was due to her being hungry. Pt adv to not make any changes if episodes reoccur or occur more frequently she is to call the office.pt agreeable with plan and verbalized

## 2014-04-12 ENCOUNTER — Telehealth: Payer: Self-pay | Admitting: Internal Medicine

## 2014-04-12 NOTE — Telephone Encounter (Signed)
She needs to continue to take her insulin even if not eating well. Use all the Lantus dose but may take less mealtime insulin (e.g. 15 units instead of 22) if sugars drop after a meal. Stay hydrated with "Zero" for e.g., but not sweet drinks. May need to check with PCP if this does not resolve soon.

## 2014-04-12 NOTE — Telephone Encounter (Signed)
Patient stated that she has a severe stomach virus, and would like to know what to do, Please advise

## 2014-04-12 NOTE — Telephone Encounter (Signed)
Called pt and advised her per Dr Gherghe's result note. Pt understood.  

## 2014-04-12 NOTE — Telephone Encounter (Signed)
Called pt back. Pt states that she had a "sour stomach" feeling all day Monday. Diarrhea began Monday night. She has had consistent diarrhea and is taking Imodium. Pt has only been able to eat unsalted crackers and plain toast. Her bg has been 150 and up since Monday. Pt is taking medication as she should, but she is concerned about losing all her electrolytes and the diarrhea. Please advise what she should do.

## 2014-04-22 ENCOUNTER — Other Ambulatory Visit: Payer: Self-pay | Admitting: Interventional Cardiology

## 2014-04-24 ENCOUNTER — Other Ambulatory Visit: Payer: Self-pay

## 2014-04-24 MED ORDER — INSULIN REGULAR HUMAN 100 UNIT/ML IJ SOLN: INTRAMUSCULAR | Status: DC

## 2014-05-26 ENCOUNTER — Ambulatory Visit: Payer: Medicare Other | Admitting: Internal Medicine

## 2014-06-08 ENCOUNTER — Other Ambulatory Visit: Payer: Self-pay | Admitting: *Deleted

## 2014-06-08 ENCOUNTER — Ambulatory Visit (INDEPENDENT_AMBULATORY_CARE_PROVIDER_SITE_OTHER): Payer: Medicare Other | Admitting: Internal Medicine

## 2014-06-08 ENCOUNTER — Encounter: Payer: Self-pay | Admitting: Internal Medicine

## 2014-06-08 VITALS — BP 136/60 | HR 83 | Temp 98.1°F | Resp 12 | Wt 225.0 lb

## 2014-06-08 DIAGNOSIS — Z23 Encounter for immunization: Secondary | ICD-10-CM

## 2014-06-08 DIAGNOSIS — I2510 Atherosclerotic heart disease of native coronary artery without angina pectoris: Secondary | ICD-10-CM

## 2014-06-08 DIAGNOSIS — E139 Other specified diabetes mellitus without complications: Secondary | ICD-10-CM

## 2014-06-08 LAB — HEMOGLOBIN A1C: Hgb A1c MFr Bld: 7.5 % — ABNORMAL HIGH (ref 4.6–6.5)

## 2014-06-08 MED ORDER — INSULIN REGULAR HUMAN 100 UNIT/ML IJ SOLN: INTRAMUSCULAR | Status: DC

## 2014-06-08 NOTE — Progress Notes (Signed)
Patient ID: Danielle Vargas, female   DOB: 1951/05/30, 63 y.o.   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 Last visit 3.5 mo ago.  Last hemoglobin A1c was:  01/18/2014: HbA1c 6.8% - per PCP Lab Results  Component Value Date   HGBA1C 7.1* 10/26/2013  Prev. 7.9%, 07/07/2013.  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  Previous regimen: - Metformin 1000 mg bid  - U500 0.11-0.11-0.10 units before meals >> 0.09 mL tid  She has been on Glipizide in the past, too. She tried Invokana in the past >> severe diarrhea  She is now on: Lantus 60 units at bedtime. Novolin R U100: - 22 units (small meal)  - 26 units (larger meal - uses this mostly)  Sliding scale NovoLog insulin to the mealtime insulin: - 150- 165: + 1 unit  - 166- 180: + 2 units  - 181- 195: + 3 units  - 196- 210: + 4 units  - >210: + 5 units   Pt checks her sugars 5-6x a day - brings a detailed log: - am: 84-151 (247 highest) >>99-248, with 2 sugars at 59 >> 122-188 >> 96-194 (104-130) >> 133-261 >> 84-209 >> 91, 138-215, 250 - 2h after b'fast: 120-257 >> 163-271 >> 155-191 >> 74-188 >> 109-244 >> 129-228 >> 136, 194-301 - before lunch: 128-290 >> 113-257 >> 77-141 >> 68-166 >>107-236 >> 74-227 >> 151-269, 288 - 2h after lunch: 92-248 >> 200-266 >> 129-143 >> 107-184 (highest 243) >> 82-238 >> 81-182 (2x: 244,273) >>165-209, 264 - before dinner: 141-245 >> 141-264 >> 64-108 >> 95-196 >> 107-256 >> 71-178 (191) >> 137, 142-229, 247 - 2h after dinner: 153-224 >> 110-298 >> 180-209 >> 149-197 >> 68-265 >> 174-267 >> 193-260, 322 - bedtime: 95-276 >> 88-266 >> 127-204 >> 94-209 >> 91-330 >> 87-230 >> 118, 163-275 - nighttime: 161-310 >> n/c >> 75-232 >> 136-291 >> n/c few lows - lowest 66 x 1; she has hypoglycemia awareness at  95.  Highest sugar was 300s.  Pt's meals are: - Breakfast: 2 slices of bread + PB - Lunch: sandwich;  salad + cheese + croutons;  baked potato >> pinto beans + cornbread - Dinner: leftovers or corn bread + pinto beans + occas. Cole slaw >> salad + lite dressing + saltines Not soft drinks anymore. Snacks at night  - has mild CKD, last BUN/creatinine:  Lab Results  Component Value Date   BUN 27* 10/29/2013   CREATININE 1.00 10/29/2013  She is back on Losartan. - pt has HL:  Lab Results  Component Value Date   CHOL 115 10/27/2013   HDL 21* 10/27/2013   LDLCALC 47 10/27/2013   TRIG 236* 10/27/2013   CHOLHDL 5.5 10/27/2013  She is on Fenofibrate. - last eye exam was in 01/2014 (unchanged) - Dr Zigmund Daniel. R>L DR.  - + numbness and tingling in her feet. On neurontin.  She also has a history of HTN, HL, fatty liver, GERD, morbid obesity, hypothyroidism, migraines, chronic back pain, history of nephrolithiasis, osteoporosis, diverticulosis.   I reviewed pt's medications, allergies, PMH, social hx, family hx and no changes required, except as mentioned above.  ROS: Constitutional: no weight gain/loss, no fatigue, no subjective hyperthermia/hypothermia Eyes: no blurry vision, no xerophthalmia ENT: no sore throat, no nodules palpated in throat, no dysphagia/odynophagia, no hoarseness  Cardiovascular: no CP/SOB/palpitations/+ leg swelling Respiratory: no cough/+ SOB/+ wheezing Gastrointestinal: no N/V/D/C Musculoskeletal: no muscle/joint aches Skin: no rashes Neurological: no tremors/numbness/tingling/dizziness  PE: BP 136/60  Pulse 83  Temp(Src) 98.1 F (36.7 C) (Oral)  Resp 12  Wt 225 lb (102.059 kg)  SpO2 97% Wt Readings from Last 3 Encounters:  06/08/14 225 lb (102.059 kg)  03/27/14 220 lb (99.791 kg)  02/14/14 211 lb 12.8 oz (96.072 kg)   Constitutional: overweight, in NAD Eyes: PERRLA, EOMI, no exophthalmos ENT: moist mucous membranes, no thyromegaly, no cervical  lymphadenopathy Cardiovascular: RRR, No MRG Respiratory: CTA B Gastrointestinal: abdomen soft, NT, ND, BS+ Musculoskeletal: no deformities, strength intact in all 4 Skin: moist, warm, no rashes  ASSESSMENT: 1. DM2, insulin-dependent, uncontrolled, with complications - CAD - AMI 10/26/2013 - had angioplasty, no stent placed. Had 2DEcho on 01/23/2014 EF increased (from 35) to 55%. - peripheral neuropathy, on Neurontin - Gastroparesis, on Reglan - Dr. 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 Patient with long-standing diabetes - sugars still above goal at all times of the day - discussed healthy dietary changes, and advised her to stop eating at night. She sleeps better since she had to put her cat to sleep so she thinks she can stay away from snacking at night.  -  I suggested:  Patient Instructions  Please continue Lantus 60 units at bedtime. Increase Novolin R U100: - 24 units (small meal)  - 28 units (larger meal - uses this mostly)  Continue Sliding scale NovoLog insulin to the mealtime insulin: - 150- 165: + 1 unit  - 166- 180: + 2 units  - 181- 195: + 3 units  - 196- 210: + 4 units  - >210: + 5 units  Please stop at the lab. Please come back for a follow-up appointment in 3 months  - continue checking sugars at different times of the day and write them down - she is doing a great job with this - will check A1c and fructosmaine now - will check HbA1c and fructosamine today - given flu vaccine - Return to clinic in 6mo with sugar log   Component     Latest Ref Rng 06/08/2014  Hemoglobin A1C     4.6 - 6.5 % 7.5 (H)  Fructosamine     190 - 270 umol/L 269  Calculated HbA1c from fructosamine: 6.2%, even lower than the HbA1c obtained by the lab. Will consider the lab checed HbA1c as more accurate based on her log.

## 2014-06-08 NOTE — Patient Instructions (Addendum)
Please continue Lantus 60 units at bedtime. Increase Novolin R U100: - 24 units (small meal)  - 28 units (larger meal - uses this mostly)  Continue Sliding scale NovoLog insulin to the mealtime insulin: - 150- 165: + 1 unit  - 166- 180: + 2 units  - 181- 195: + 3 units  - 196- 210: + 4 units  - >210: + 5 units  Please stop at the lab. Please come back for a follow-up appointment in 3 months

## 2014-06-10 LAB — FRUCTOSAMINE: Fructosamine: 269 umol/L (ref 190–270)

## 2014-06-29 NOTE — Telephone Encounter (Signed)
error 

## 2014-07-11 ENCOUNTER — Other Ambulatory Visit: Payer: Self-pay | Admitting: *Deleted

## 2014-07-11 MED ORDER — INSULIN REGULAR HUMAN 100 UNIT/ML IJ SOLN: INTRAMUSCULAR | Status: DC

## 2014-07-17 ENCOUNTER — Ambulatory Visit (INDEPENDENT_AMBULATORY_CARE_PROVIDER_SITE_OTHER): Payer: Medicare Other | Admitting: Ophthalmology

## 2014-07-24 ENCOUNTER — Encounter: Payer: Self-pay | Admitting: Neurology

## 2014-07-28 ENCOUNTER — Telehealth: Payer: Self-pay | Admitting: Internal Medicine

## 2014-07-28 NOTE — Telephone Encounter (Signed)
Patient stated that she went to her PCP Doctor, she has a sinus infection,he prescribed a antibiotic and a mild steroid, would that interfere with her diabetes medication. Please advise

## 2014-07-28 NOTE — Telephone Encounter (Signed)
Called pt and advised her per Dr Arman Filter note. Pt understood. Pt will call next week and let us know how her sugar levels are . Pt will do so.

## 2014-07-28 NOTE — Telephone Encounter (Signed)
Please read note below and advise.  

## 2014-07-28 NOTE — Telephone Encounter (Signed)
No, it will not interfere with her insulins, however, her sugars might be higher due to the steroids. The steroids will usually increase her after meal sugars, so she would probably need to increase her mealtime insulin. She can go up by 2-4 units depending on her postprandial sugars.

## 2014-08-08 ENCOUNTER — Ambulatory Visit: Payer: Medicare Other | Admitting: Podiatry

## 2014-08-16 ENCOUNTER — Institutional Professional Consult (permissible substitution): Payer: Medicare Other | Admitting: Neurology

## 2014-08-17 ENCOUNTER — Ambulatory Visit: Payer: Medicare Other | Admitting: Podiatry

## 2014-08-17 ENCOUNTER — Encounter (HOSPITAL_COMMUNITY): Payer: Self-pay | Admitting: Interventional Cardiology

## 2014-09-04 ENCOUNTER — Encounter (INDEPENDENT_AMBULATORY_CARE_PROVIDER_SITE_OTHER): Payer: Medicare Other | Admitting: Podiatry

## 2014-09-04 DIAGNOSIS — M79673 Pain in unspecified foot: Secondary | ICD-10-CM

## 2014-09-04 DIAGNOSIS — B351 Tinea unguium: Secondary | ICD-10-CM

## 2014-09-04 NOTE — Patient Instructions (Signed)
Diabetes and Foot Care Diabetes may cause you to have problems because of poor blood supply (circulation) to your feet and legs. This may cause the skin on your feet to become thinner, break easier, and heal more slowly. Your skin may become dry, and the skin may peel and crack. You may also have nerve damage in your legs and feet causing decreased feeling in them. You may not notice minor injuries to your feet that could lead to infections or more serious problems. Taking care of your feet is one of the most important things you can do for yourself.  HOME CARE INSTRUCTIONS  Wear shoes at all times, even in the house. Do not go barefoot. Bare feet are easily injured.  Check your feet daily for blisters, cuts, and redness. If you cannot see the bottom of your feet, use a mirror or ask someone for help.  Wash your feet with warm water (do not use hot water) and mild soap. Then pat your feet and the areas between your toes until they are completely dry. Do not soak your feet as this can dry your skin.  Apply a moisturizing lotion or petroleum jelly (that does not contain alcohol and is unscented) to the skin on your feet and to dry, brittle toenails. Do not apply lotion between your toes.  Trim your toenails straight across. Do not dig under them or around the cuticle. File the edges of your nails with an emery board or nail file.  Do not cut corns or calluses or try to remove them with medicine.  Wear clean socks or stockings every day. Make sure they are not too tight. Do not wear knee-high stockings since they may decrease blood flow to your legs.  Wear shoes that fit properly and have enough cushioning. To break in new shoes, wear them for just a few hours a day. This prevents you from injuring your feet. Always look in your shoes before you put them on to be sure there are no objects inside.  Do not cross your legs. This may decrease the blood flow to your feet.  If you find a minor scrape,  cut, or break in the skin on your feet, keep it and the skin around it clean and dry. These areas may be cleansed with mild soap and water. Do not cleanse the area with peroxide, alcohol, or iodine.  When you remove an adhesive bandage, be sure not to damage the skin around it.  If you have a wound, look at it several times a day to make sure it is healing.  Do not use heating pads or hot water bottles. They may burn your skin. If you have lost feeling in your feet or legs, you may not know it is happening until it is too late.  Make sure your health care provider performs a complete foot exam at least annually or more often if you have foot problems. Report any cuts, sores, or bruises to your health care provider immediately. SEEK MEDICAL CARE IF:   You have an injury that is not healing.  You have cuts or breaks in the skin.  You have an ingrown nail.  You notice redness on your legs or feet.  You feel burning or tingling in your legs or feet.  You have pain or cramps in your legs and feet.  Your legs or feet are numb.  Your feet always feel cold. SEEK IMMEDIATE MEDICAL CARE IF:   There is increasing redness,   swelling, or pain in or around a wound.  There is a red line that goes up your leg.  Pus is coming from a wound.  You develop a fever or as directed by your health care provider.  You notice a bad smell coming from an ulcer or wound. Document Released: 08/22/2000 Document Revised: 04/27/2013 Document Reviewed: 02/01/2013 [employer] Patient Information 2015 [employer]. This information is not intended to replace advice given to you by your health care provider. Make sure you discuss any questions you have with your health care provider.  

## 2014-09-04 NOTE — Progress Notes (Signed)
   Subjective:    Patient ID: Danielle Vargas, female    DOB: 1950/09/12, 63 y.o.   MRN: [id number]  HPI    Review of Systems  All other systems reviewed and are negative.      Objective:   Physical Exam        Assessment & Plan:

## 2014-09-05 ENCOUNTER — Ambulatory Visit (INDEPENDENT_AMBULATORY_CARE_PROVIDER_SITE_OTHER): Payer: Medicare Other | Admitting: Ophthalmology

## 2014-09-07 NOTE — Progress Notes (Signed)
This encounter was created in error - please disregard.

## 2014-09-11 ENCOUNTER — Encounter: Payer: Self-pay | Admitting: Internal Medicine

## 2014-09-11 ENCOUNTER — Ambulatory Visit (INDEPENDENT_AMBULATORY_CARE_PROVIDER_SITE_OTHER): Payer: Medicare Other | Admitting: Internal Medicine

## 2014-09-11 ENCOUNTER — Other Ambulatory Visit: Payer: Self-pay | Admitting: *Deleted

## 2014-09-11 VITALS — BP 102/68 | HR 77 | Temp 98.4°F | Resp 12 | Wt 220.0 lb

## 2014-09-11 DIAGNOSIS — E139 Other specified diabetes mellitus without complications: Secondary | ICD-10-CM

## 2014-09-11 MED ORDER — INSULIN GLARGINE 100 UNIT/ML ~~LOC~~ SOLN: 70.0000 [IU] | Freq: Every day | SUBCUTANEOUS | Status: DC

## 2014-09-11 NOTE — Progress Notes (Signed)
Patient ID: Danielle Vargas, female   DOB: 1950/12/29, 64 y.o.   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 Last visit 3 mo ago.  She had oral surgery, then had acute sinusitis and then a eardrum that ruptured >> was on ABx.   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  Previous regimen: - Metformin 1000 mg bid  - U500 0.11-0.11-0.10 units before meals >> 0.09 mL tid  She has been on Glipizide in the past, too. She tried Invokana in the past >> severe diarrhea  She is now on: Lantus 60 units at bedtime (vial - prefers a vial). Humulin R U100: - 22 >> 24 >> 30 units (small meal)  - 26  >> 28 >> 34 units (larger meal)  Sliding scale Humulin insulin to the mealtime insulin: - 150- 165: + 1 unit  - 166- 180: + 2 units  - 181- 195: + 3 units  - 196- 210: + 4 units  - >210: + 5 units   Pt checks her sugars 3-4x a day - brings a detailed log - sugars still high over the Holidays: - am: 96-194 (104-130) >> 133-261 >> 84-209 >> 91, 138-215, 250 >> 116-228 - 2h after b'fast: 155-191 >> 74-188 >> 109-244 >> 129-228 >> 136, 194-301 >> 134-246 - before lunch: 68-166 >>107-236 >> 74-227 >> 151-269, 288 >> 104, 109-203, 244 - 2h after lunch:82-238 >> 81-182 (2x: 244,273) >>165-209, 264 >> 93-223, 251 - before dinner: 71-178 (191) >> 137, 142-229, 247 >> 146-239 - 2h after dinner: 149-197 >> 68-265 >> 174-267 >> 193-260, 322 >> 108-202, 266 - bedtime: 91-330 >> 87-230 >> 118, 163-275 >> 111-242,  300 - nighttime: 161-310 >> n/c >> 75-232 >> 136-291 >> n/c few lows - lowest 66 x 1; she has hypoglycemia awareness at 95.  Highest sugar was 300s.  Pt's meals are: - Breakfast: 2 slices of bread + PB - Lunch: sandwich;  salad + cheese + croutons;  baked potato >> pinto beans + cornbread - Dinner: leftovers or corn bread + pinto beans + occas. Cole slaw >> salad + lite dressing + saltines Not soft drinks anymore. Snacks at night  - has mild CKD, last BUN/creatinine:  Lab Results  Component Value Date   BUN 27* 10/29/2013   CREATININE 1.00 10/29/2013  She is back on Losartan. - pt has HL:  Lab Results  Component Value Date   CHOL 115 10/27/2013   HDL 21* 10/27/2013   LDLCALC 47 10/27/2013   TRIG 236* 10/27/2013   CHOLHDL 5.5 10/27/2013  She is on Fenofibrate. - last eye exam was in 01/2014 (unchanged) - Dr Zigmund Daniel. R>L DR.  - + numbness and tingling in her feet. On neurontin.  She also has a history of HTN, HL, fatty liver, GERD, morbid obesity, hypothyroidism, migraines, chronic back pain, history of nephrolithiasis, osteoporosis, diverticulosis.   I reviewed pt's medications, allergies, PMH, social hx, family hx, and changes  were documented in the history of present illness. Otherwise, unchanged from my initial visit note.  ROS: Constitutional: no weight gain/loss, no fatigue, no subjective hyperthermia/hypothermia Eyes: no blurry vision, no xerophthalmia ENT: no sore throat, no nodules palpated in throat, no dysphagia/odynophagia, no hoarseness Cardiovascular: no CP/SOB/palpitations/leg swelling Respiratory: no cough/SOB/wheezing Gastrointestinal: no N/V/D/C Musculoskeletal: no muscle/joint aches Skin: no rashes Neurological: no tremors/numbness/tingling/dizziness  PE: BP 102/68 mmHg  Pulse 77  Temp(Src) 98.4 F (36.9 C) (Oral)  Resp 12  Wt 220 lb (99.791 kg)  SpO2 93% Wt Readings from Last 3 Encounters:  09/11/14 220 lb (99.791 kg)  06/08/14 225 lb (102.059  kg)  03/27/14 220 lb (99.791 kg)   Constitutional: overweight, in NAD Eyes: PERRLA, EOMI, no exophthalmos ENT: moist mucous membranes, no thyromegaly, no cervical lymphadenopathy Cardiovascular: RRR, No MRG Respiratory: CTA B Gastrointestinal: abdomen soft, NT, ND, BS+ Musculoskeletal: no deformities, strength intact in all 4 Skin: moist, warm, no rashes  ASSESSMENT: 1. DM2, insulin-dependent, uncontrolled, with complications - CAD - AMI 10/26/2013 - had angioplasty, no stent placed. Had 2DEcho on 01/23/2014 EF increased (from 35) to 55%. - peripheral neuropathy, on Neurontin - Gastroparesis, on Reglan - Dr. 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 Patient with long-standing diabetes - sugars still above goal at all times of the day, but now better sugars after meals and higher sugars before meals >> will need increase Lantus. Will need Toujeo in the future (we discussed about this but has a lot of Lantus at home) >> will check with her insurance if Toujeo covered  -  I suggested:  Patient Instructions  Please increase Lantus to 70 units at bedtime (you can inject 35 units 2x a day). Continue HumulinR U100: - 30 units (small meal)  - 34 units (larger meal)  Continue sliding scale Humulin insulin: - 150- 165: + 1 unit  - 166- 180: + 2 units  - 181- 195: + 3 units  - 196- 210: + 4 units  - >210: + 5 units   - continue checking sugars at different times of the day and write them down - she is doing a great job with this - will check HbA1c and fructosamine today - given flu vaccine at last visit - Return to clinic in 1.5 mo with sugar log   Pt did not stop to the lab.

## 2014-09-11 NOTE — Patient Instructions (Signed)
Please increase Lantus to 70 units at bedtime (you can inject 35 units 2x a day). Continue HumulinR U100: - 30 units (small meal)  - 34 units (larger meal)  Continue sliding scale Humulin insulin: - 150- 165: + 1 unit  - 166- 180: + 2 units  - 181- 195: + 3 units  - 196- 210: + 4 units  - >210: + 5 units

## 2014-09-18 ENCOUNTER — Encounter: Payer: Self-pay | Admitting: Neurology

## 2014-09-18 ENCOUNTER — Ambulatory Visit (INDEPENDENT_AMBULATORY_CARE_PROVIDER_SITE_OTHER): Payer: Medicare Other | Admitting: Neurology

## 2014-09-18 DIAGNOSIS — E662 Morbid (severe) obesity with alveolar hypoventilation: Secondary | ICD-10-CM

## 2014-09-18 DIAGNOSIS — G4710 Hypersomnia, unspecified: Secondary | ICD-10-CM

## 2014-09-18 DIAGNOSIS — E119 Type 2 diabetes mellitus without complications: Secondary | ICD-10-CM | POA: Insufficient documentation

## 2014-09-18 DIAGNOSIS — E1340 Other specified diabetes mellitus with diabetic neuropathy, unspecified: Secondary | ICD-10-CM

## 2014-09-18 DIAGNOSIS — E133399 Other specified diabetes mellitus with moderate nonproliferative diabetic retinopathy without macular edema, unspecified eye: Secondary | ICD-10-CM

## 2014-09-18 DIAGNOSIS — E0849 Diabetes mellitus due to underlying condition with other diabetic neurological complication: Secondary | ICD-10-CM

## 2014-09-18 DIAGNOSIS — G4730 Sleep apnea, unspecified: Secondary | ICD-10-CM

## 2014-09-18 DIAGNOSIS — E13339 Other specified diabetes mellitus with moderate nonproliferative diabetic retinopathy without macular edema: Secondary | ICD-10-CM

## 2014-09-18 HISTORY — DX: Hypersomnia, unspecified: G47.10

## 2014-09-18 MED ORDER — ZOLPIDEM TARTRATE ER 6.25 MG PO TBCR: 6.2500 mg | EXTENDED_RELEASE_TABLET | Freq: Every evening | ORAL | Status: DC | PRN

## 2014-09-18 NOTE — Patient Instructions (Signed)
Polysomnography (Sleep Studies) Polysomnography (PSG) is a series of tests used for detecting (diagnosing) obstructive sleep apnea and other sleep disorders. The tests measure how some parts of your body are working while you are sleeping. The tests are extensive and expensive. They are done in a sleep lab or hospital, and vary from center to center. Your caregiver may perform other more simple sleep studies and questionnaires before doing more complete and involved testing. Testing may not be covered by insurance. Some of these tests are:  An EEG (Electroencephalogram). This tests your brain waves and stages of sleep.  An EOG (Electrooculogram). This measures the movements of your eyes. It detects periods of REM (rapid eye movement) sleep, which is your dream sleep.  An EKG (Electrocardiogram). This measures your heart rhythm.  EMG (Electromyography). This is a measurement of how the muscles are working in your upper airway and your legs while sleeping.  An oximetry measurement. It measures how much oxygen (air) you are getting while sleeping.  Breathing efforts may be measured. The same test can be interpreted (understood) differently by different caregivers and centers that study sleep.  Studies may be given an apnea/hypopnea index (AHI). This is a number which is found by counting the times of no breathing or under breathing during the night, and relating those numbers to the amount of time spent in bed. When the AHI is greater than 15, the patient is likely to complain of daytime sleepiness. When the AHI is greater than 30, the patient is at increased risk for heart problems and must be followed more closely. Following the AHI also allows you to know how treatment is working. Simple oximetry (tracking the amount of oxygen that is taken in) can be used for screening patients who:  Do not have symptoms (problems) of OSA.  Have a normal Epworth Sleepiness Scale Score.  Have a low pre-test  probability of having OSA.  Have none of the upper airway problems likely to cause apnea.  Oximetry is also used to determine if treatment is effective in patients who showed significant desaturations (not getting enough oxygen) on their home sleep study. One extra measure of safety is to perform additional studies for the person who only snores. This is because no one can predict with absolute certainty who will have OSA. Those who show significant desaturations (not getting enough oxygen) are recommended to have a more detailed sleep study. Document Released: 03/01/2003 Document Revised: 11/17/2011 Document Reviewed: 10/31/2013 [employer] Patient Information 2015 [employer]. This information is not intended to replace advice given to you by your health care provider. Make sure you discuss any questions you have with your health care provider.  

## 2014-09-18 NOTE — Progress Notes (Signed)
[place]   Provider:  Larey Seat, M D  Referring Provider: Marton Redwood, MD Primary Care Physician:  Marton Redwood, MD  Chief Complaint  Patient presents with  . sleep consultant    HPI:  Danielle Vargas is a 64 y.o. female , seen here as a  NP referral   from Dr. Brigitte Pulse for  a sleep consultation.  09-18-14  Is followed by Dr. Maryla Morrow her endocrinologist, and for primary care followed by Dr. Carmie Kanner.  She is seen here  as a new patient after reporting to Dr. Brigitte Pulse that she is excessively daytime sleepy she also has increased pulmonary wheezing recently gets short of breath with any light activity or just carrying minor weight. She has been using albuterol more and more often and remains on Singulair in the past she was diagnosed with a cat her allergy but she no longer has suspect. Her allergies became better. But she reports here today is that she is in a significant amount of pain nearly every day that she has back pain and joint pain and also neuropathic sensory changes. She has not been able to lose any weight and by BMI is considered morbidly obese at this time. Her sleep has been non-restorative not refreshing for while. She states that she sleeps poorly partially due to pain and partially because she has to be up and down all night. She lives alone but is noted to snore. Her daughter and son-in-law have been visiting and noticed that she is snoring and irregularly breathing at night. This may be the cause for her excessive sleepiness during the day. She has often waking been waking up drowsy and was a dull headache. She is also taking hydrocodone for her back pain,  which she has been using for a while- this does however not provide her with enough coverage to sleep through the night  The patient reports that she sleeps rarely more than 6 hours and almost always only 4 hours at night -she goes usually to bed at around 2:00 in the morning. She  has been  falling asleep on her sofa prior in the evening hours watching TV. So overall it is harder to judge how much sleep the patient truly gets, abut 6 hours she estimated.  The patient has mostly truncal obesity and father slender comparably extremities.  She states that on her sofa, which is rather firm, she uses 2 pillows to prop her slightly up this seems to support her back and allows her to breathe better.  In her bed she uses only one pillow and no wedge and feels that she doesn't sleep as well and doesn't breathe as well. This probably could be helped by using a wedge to lift the head of bed up a little bit. She drinks caffeine in [place] form .  She goes to the bathroom 2-3 times at night.   The patient carries the additional diagnoses of fatty liver disease,  morbid obesity, chronic pain treated with narcotics. 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 She has hypertension, hyperlipidemia, osteoporosis, vitamin D deficiency, colon polyps, nephrolithiasis, and carries a diagnosis of depression. She had she had a C-spine fusion a right carpal tunnel repair and all non-nerve release and a left rotator cuff repair between 2002  and 2009.  The patient lives alone she used to have cats which she does not longer keep due to her allergy potential, she has never been a smoker, she is not exposed to passive smoke.  Family history is positive for alcoholism in her father who died of a stroke in his 47s and also COPD he was a heavy smoker as was her mother who suffers from diabetes mellitus type 2, peripheral vascular disease, emphysema, and congestive heart failure and died at age 25. A brother suffers from psoriasis and kidney stones a sister is status post liver transplant after  hepatic cirrhosis, her son and daughter are healthy.     Review of Systems: Out of a complete 14 system review, the patient complains of only the following symptoms, and all other reviewed systems are negative.  The patient endorsed coughing, wheezing, shortness of breath, snoring apparently witnessed with occasional gaps in her nocturnal breathing. Blurred vision related to a retinopathy, excessive fatigue and daytime sleepiness, the feeling that she does have decreased energy and not enough sleep at night. The Epworth sleepiness score was endorsed at 17 points which is elevated, the fatigue severity score was not endorsed until i reminded her . Geriatric depression score is 4 points.    Epworth score 17 , Fatigue severity score  63 points  ,    History   Social History  . Marital Status: Divorced    Spouse Name: N/A    Number of Children: 2  . Years of Education: N/A   Occupational History  . disabled    Social History Main Topics  . Smoking status: Never Smoker   . Smokeless tobacco: Never Used  . Alcohol Use: No  . Drug Use: No  . Sexual Activity: Not Currently    Birth Control/ Protection: Post-menopausal   Other Topics Concern  . Not on file   Social History Narrative   Lives in [place].     Regular exercise: none   Caffeine use: daily; dt coke          Family History  Problem Relation Age of Onset  . COPD Father   . Stroke Father   . Heart failure Brother   . Heart failure Mother   . Hypertension Brother   . Heart attack Mother     several  . Colon cancer      mat. 1st cousin  . Other      celiac sprue, 1/2 brother  . Diabetes Mother   . Diabetes Brother     x 3  . Lung cancer Brother   . Cirrhosis Sister     liver transplant  . Kidney disease Mother   . Emphysema Father     Past Medical History  Diagnosis Date  . DM (diabetes mellitus)   . Obesity   . Dyslipidemia   . HTN (hypertension)   . Hypothyroidism   . Gastroparesis   . Migraine     . Osteoporosis   . Colon polyps 7/07    hyperplastic and adenomatous  . Depression   . Nephrolithiasis   . Diverticulosis   . External hemorrhoid   . Fatty liver   . Asthma   . CAD (coronary artery disease)     a. 12/2008 Cath: mild irregs throughout, EF 75%.  . Hypersomnia with sleep apnea 09/18/2014    Past Surgical History  Procedure Laterality Date  . Tubal ligation  '85  . Breast reduction surgery  '88  . Cervical disc  surgery  '98    fusion  . Carpal tunnel release  2000    right wrist  . Rotator cuff repair  '09    left shoulder  . Ulnar nerve repair  '02, '08    left done, then right  . Cesarean section  '79  '84  . Left heart catheterization with coronary angiogram N/A 10/26/2013    Procedure: LEFT HEART CATHETERIZATION WITH CORONARY ANGIOGRAM;  Surgeon: Sinclair Grooms, MD;  Location: [facility] CATH LAB;  Service: Cardiovascular;  Laterality: N/A;    Current Outpatient Prescriptions  Medication Sig Dispense Refill  . albuterol (PROVENTIL HFA;VENTOLIN HFA) 108 (90 BASE) MCG/ACT inhaler Inhale 2 puffs into the lungs every 6 (six) hours as needed. For shortness of breath.    Marland Kitchen amLODipine (NORVASC) 5 MG tablet Take 5 mg by mouth every morning.     Marland Kitchen aspirin EC 81 MG tablet Take 81 mg by mouth every morning.    . calcium-vitamin D (CALCIUM 500/D) 500-200 MG-UNIT per tablet Take 1 tablet by mouth every evening.     . carisoprodol (SOMA) 350 MG tablet Take 350 mg by mouth 4 (four) times daily.     . carvedilol (COREG) 12.5 MG tablet Take 1.5 tablets (18.75 mg total) by mouth 2 (two) times daily with a meal. 540 tablet 3  . clopidogrel (PLAVIX) 75 MG tablet Take 1 tablet (75 mg total) by mouth daily with breakfast. 30 tablet 11  . Coenzyme Q10 (CO Q 10) 100 MG CAPS Take 1 tablet by mouth 2 (two) times daily.     . CRESTOR 20 MG tablet TAKE 1 TABLET EVERY DAY 90 tablet 1  . fenofibrate 160 MG tablet Take 160 mg by mouth at bedtime.     . furosemide (LASIX) 40 MG tablet Take 40  mg by mouth every morning.    . gabapentin (NEURONTIN) 300 MG capsule Take 300 mg by mouth 3 (three) times daily.    Marland Kitchen HYDROcodone-acetaminophen (NORCO) 5-325 MG per tablet Take 1 tablet by mouth every 6 (six) hours as needed. For joint and back pain.    Marland Kitchen insulin glargine (LANTUS) 100 UNIT/ML injection Inject 0.7 mLs (70 Units total) into the skin at bedtime. 15 mL 0  . Insulin Pen Needle (FIFTY50 PEN NEEDLES) 31G X 5 MM MISC Use 4x a day 100 each prn  . insulin regular (NOVOLIN R,HUMULIN R) 100 units/mL injection Inject 24-28 units into the skin 3 times daily. Dx code; E13.9 70 mL 3  . Insulin Syringe-Needle U-100 (BD INSULIN SYRINGE ULTRAFINE) 31G X 5/16" 1 ML MISC USE TO INJECT INSULIN 4 TIMES DAILY AS INSTRUCTED. 400 each 3  . levothyroxine (SYNTHROID, LEVOTHROID) 75 MCG tablet Take 75 mcg by mouth daily before breakfast.     . losartan (COZAAR) 50 MG tablet     . metFORMIN (GLUCOPHAGE) 1000 MG tablet Take 1,000 mg by mouth 2 (two) times daily with a meal.    . metoCLOPramide (REGLAN) 10 MG tablet Take 10 mg by mouth 4 (four) times daily.     . montelukast (SINGULAIR) 10 MG tablet Take 10 mg by mouth at bedtime.    . nitroGLYCERIN (NITROSTAT) 0.4 MG SL tablet Place 1 tablet (0.4 mg total) under the tongue every 5 (five) minutes as needed for chest pain. 25 tablet 3  . pantoprazole (PROTONIX) 40 MG tablet Take 1 tablet (40 mg total) by mouth daily. 30 tablet 1  . polyethylene glycol (MIRALAX / GLYCOLAX) packet Take 17  g by mouth daily.    Marland Kitchen topiramate (TOPAMAX) 50 MG tablet Take 50 mg by mouth 2 (two) times daily.    . Vitamin D, Ergocalciferol, (DRISDOL) 50000 UNITS CAPS Take 50,000 Units by mouth every 7 (seven) days. Take on thursdays    . zolpidem (AMBIEN CR) 6.25 MG CR tablet Take 1 tablet (6.25 mg total) by mouth at bedtime as needed for sleep. 30 tablet 0   No current facility-administered medications for this visit.    Allergies as of 09/18/2014 - Review Complete 09/18/2014   Allergen Reaction Noted  . Invokana [canagliflozin] Diarrhea 09/02/2013  . Nsaids Other (See Comments) 02/08/2012    Vitals: BP 126/66 mmHg  Pulse 87  Resp 18  Ht 5\' 1"  (1.549 m)  Wt 223 lb (101.152 kg)  BMI 42.16 kg/m2 Last Weight:  Wt Readings from Last 1 Encounters:  09/18/14 223 lb (101.152 kg)       Last Height:   Ht Readings from Last 1 Encounters:  09/18/14 5\' 1"  (1.549 m)    Physical exam:  General: The patient is awake, alert and appears not in acute distress. The patient is well groomed. Head: Normocephalic, atraumatic. Neck is supple. Mallampati 3  , Goiter .  Anterior fusion scar. 1998 neck circumference: large 16.75 inches.  Nasal airflow unrestricted  TMJ is not evident . Retrognathia is seen.  Cardiovascular:  Regular rate and rhythm , without  murmurs or carotid bruit, and without distended neck veins. Respiratory: Lungs : wheezing with  auscultation. Skin:  Without evidence of edema, or rash Trunk: BMI is severely  elevated and patient  has a  Hunched posture, she carries an enormous pinnus.  Neurologic exam : The patient is awake and alert, oriented to place and time.   Memory subjective  described as intact. There is a normal attention span & concentration ability. Speech is fluent without  Dysarthria, but with dysphonia -no  aphasia. Mood and affect are sad , frustrated . Cranial nerves: Pupils are equal, small ,  not reactive to light. Funduscopic exam with pallor/  edema. Possible beginning cataract.  Extraocular movements  in vertical and horizontal planes intact and without nystagmus. Visual fields by finger perimetry are intact. Hearing to finger rub intact.  Facial sensation intact to fine touch. Facial motor strength is symmetric and tongue and uvula move midline.  Motor exam:  Normal tone, muscle bulk and symmetric, strength in all extremities. She has small extremities and her obesity is strongly truncal and abdominal,  Sensory:  Fine touch,  pinprick and vibration were tested in all extremities. Proprioception is impaired,  But there is no loss of vibration. She  feeling cold, getting diaphoretic and cold .  Pin and needle dysesthesias.   Coordination: Rapid alternating movements in the fingers/hands is normal.  Finger-to-nose maneuver normal without evidence of ataxia, dysmetria or tremor.  Gait and station: Patient walks without assistive device and is able unassisted to climb up to the exam table. Strength within normal limits. Stance is stable and normal.  Tandem gait is unfragmented. Romberg testing is  Positive - she walks through her house by touching one wall or piece of furniture to keep balance.   Deep tendon reflexes: in the  upper and lower extremities are symmetric, unusually brisk for DM. No clonus.  Babinski maneuver response is equivocal ,  downgoing.   Assessment:  After physical and neurologic examination, review of laboratory studies, imaging, neurophysiology testing and pre-existing records, assessment is  1)Patient with  morbid obesity, shortness of breath and morning headaches, nocturia and poor sleep hygiene, worsened by narcotic use. Rule out complex apnea with Co2 retention, suspect obesity hypoventilation, certainly OSA . wheezing was present today. Needs SPLIT study with CO2.  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 Pallor. Nephropathy suspected.  3) Nocturia can be related to OSA , CHF and nephropathy. Morbid obesity , patient has been frustrated by attempts to lose weight, she is very exercise intolerant.  4) Patient with longstanding DDD , status post cervical fusion. Status post diagnosis of lumbal disc disease- this will be followed by Dr Nelva Bush. No narcotics prescribed from here, [facility]NA>     The patient was advised of the nature of the diagnosed sleep disorder , the treatment options  and risks for general a health and wellness arising from not treating the condition. Visit duration was 45 minutes.   Over 50% of our visit time face-to-face were spent in discussion of the patient's past medical history, obtaining a differential diagnosis through system review, discussing the possible diagnosis and the treatment options.  Plan:  Treatment plan and additional workup : as outlined above.      Asencion Partridge Laelani Vasko MD  09/18/2014

## 2014-09-21 ENCOUNTER — Ambulatory Visit: Payer: Medicare Other | Admitting: Podiatry

## 2014-09-27 ENCOUNTER — Ambulatory Visit: Payer: Medicare Other | Admitting: Podiatry

## 2014-10-05 ENCOUNTER — Ambulatory Visit (INDEPENDENT_AMBULATORY_CARE_PROVIDER_SITE_OTHER): Payer: Medicare Other | Admitting: Ophthalmology

## 2014-10-08 ENCOUNTER — Ambulatory Visit (INDEPENDENT_AMBULATORY_CARE_PROVIDER_SITE_OTHER): Payer: Medicare Other | Admitting: Neurology

## 2014-10-08 DIAGNOSIS — E662 Morbid (severe) obesity with alveolar hypoventilation: Secondary | ICD-10-CM

## 2014-10-08 DIAGNOSIS — G4710 Hypersomnia, unspecified: Secondary | ICD-10-CM

## 2014-10-08 DIAGNOSIS — E1340 Other specified diabetes mellitus with diabetic neuropathy, unspecified: Secondary | ICD-10-CM

## 2014-10-08 DIAGNOSIS — E0849 Diabetes mellitus due to underlying condition with other diabetic neurological complication: Secondary | ICD-10-CM

## 2014-10-08 DIAGNOSIS — E133399 Other specified diabetes mellitus with moderate nonproliferative diabetic retinopathy without macular edema, unspecified eye: Secondary | ICD-10-CM

## 2014-10-08 NOTE — Sleep Study (Signed)
Please see the scanned sleep study interpretation located in the Procedure tab within the Chart Review section. 

## 2014-10-13 ENCOUNTER — Other Ambulatory Visit: Payer: Self-pay | Admitting: Neurology

## 2014-10-13 ENCOUNTER — Telehealth: Payer: Self-pay | Admitting: *Deleted

## 2014-10-13 ENCOUNTER — Encounter: Payer: Self-pay | Admitting: *Deleted

## 2014-10-13 ENCOUNTER — Encounter: Payer: Self-pay | Admitting: Neurology

## 2014-10-13 DIAGNOSIS — G4733 Obstructive sleep apnea (adult) (pediatric): Secondary | ICD-10-CM

## 2014-10-13 DIAGNOSIS — G4731 Primary central sleep apnea: Secondary | ICD-10-CM

## 2014-10-13 NOTE — Telephone Encounter (Signed)
Patient was contacted and provided the results of her split night sleep study that revealed severe and complex sleep apnea.  Patient was advised that CPAP therapy was recommended for home use by Dr. Brett Fairy.  Patient was in agreement and was referred to [facility] for [facility] CPAP set up.  Dr. Marton Redwood was faxed a copy of the report.  The patient gave verbal permission to mail a copy of her test results.   Patient instructed to contact our office 6-8 weeks post set up to schedule a follow up appointment.

## 2014-10-18 ENCOUNTER — Encounter: Payer: Self-pay | Admitting: Neurology

## 2014-10-27 ENCOUNTER — Ambulatory Visit: Payer: Medicare Other | Admitting: Internal Medicine

## 2014-10-30 ENCOUNTER — Encounter: Payer: Self-pay | Admitting: Interventional Cardiology

## 2014-10-30 ENCOUNTER — Ambulatory Visit (INDEPENDENT_AMBULATORY_CARE_PROVIDER_SITE_OTHER): Payer: Medicare Other | Admitting: Interventional Cardiology

## 2014-10-30 VITALS — BP 110/50 | HR 86 | Ht 61.0 in | Wt 226.2 lb

## 2014-10-30 DIAGNOSIS — I2510 Atherosclerotic heart disease of native coronary artery without angina pectoris: Secondary | ICD-10-CM

## 2014-10-30 DIAGNOSIS — E0849 Diabetes mellitus due to underlying condition with other diabetic neurological complication: Secondary | ICD-10-CM

## 2014-10-30 DIAGNOSIS — G4730 Sleep apnea, unspecified: Secondary | ICD-10-CM

## 2014-10-30 DIAGNOSIS — I10 Essential (primary) hypertension: Secondary | ICD-10-CM

## 2014-10-30 DIAGNOSIS — G4710 Hypersomnia, unspecified: Secondary | ICD-10-CM

## 2014-10-30 DIAGNOSIS — I5032 Chronic diastolic (congestive) heart failure: Secondary | ICD-10-CM

## 2014-10-30 MED ORDER — CARVEDILOL 12.5 MG PO TABS: 12.5000 mg | ORAL_TABLET | Freq: Two times a day (BID) | ORAL | Status: DC

## 2014-10-30 MED ORDER — FUROSEMIDE 20 MG PO TABS: 20.0000 mg | ORAL_TABLET | Freq: Every morning | ORAL | Status: DC

## 2014-10-30 NOTE — Progress Notes (Signed)
Cardiology Office Note   Date:  10/30/2014   ID:  Danielle Vargas, DOB 1951/06/13, MRN [id number]  PCP:  Marton Redwood, MD  Cardiologist:   Sinclair Grooms, MD   No chief complaint on file.     History of Present Illness: Danielle Vargas is a 64 y.o. female who presents for CAD and prior SCAD. For the most part no significant obstruction noted. She complains of excessive fatigue including weakness in her legs. She has chronic back pain. She has not had angina. She denies orthopnea PND. She denies syncope.    Past Medical History  Diagnosis Date  . DM (diabetes mellitus)   . Obesity   . Dyslipidemia   . HTN (hypertension)   . Hypothyroidism   . Gastroparesis   . Migraine   . Osteoporosis   . Colon polyps 7/07    hyperplastic and adenomatous  . Depression   . Nephrolithiasis   . Diverticulosis   . External hemorrhoid   . Fatty liver   . Asthma   . CAD (coronary artery disease)     a. 12/2008 Cath: mild irregs throughout, EF 75%.  . Hypersomnia with sleep apnea 09/18/2014    Past Surgical History  Procedure Laterality Date  . Tubal ligation  '85  . Breast reduction surgery  '88  . Cervical disc surgery  '98    fusion  . Carpal tunnel release  2000    right wrist  . Rotator cuff repair  '09    left shoulder  . Ulnar nerve repair  '02, '08    left done, then right  . Cesarean section  '79  '84  . Left heart catheterization with coronary angiogram N/A 10/26/2013    Procedure: LEFT HEART CATHETERIZATION WITH CORONARY ANGIOGRAM;  Surgeon: Sinclair Grooms, MD;  Location[facility] CATH LAB;  Service: Cardiovascular;  Laterality: N/A;     Current Outpatient Prescriptions  Medication Sig Dispense Refill  . albuterol (PROVENTIL HFA;VENTOLIN HFA) 108 (90 BASE) MCG/ACT inhaler Inhale 2 puffs into the lungs every 6 (six) hours as needed. For shortness of breath.    Marland Kitchen amLODipine (NORVASC) 5 MG tablet Take 5 mg by mouth every morning.     Marland Kitchen aspirin EC 81 MG tablet Take 81  mg by mouth every morning.    . calcium-vitamin D (CALCIUM 500/D) 500-200 MG-UNIT per tablet Take 1 tablet by mouth every evening.     . carisoprodol (SOMA) 350 MG tablet Take 350 mg by mouth 4 (four) times daily.     . carvedilol (COREG) 12.5 MG tablet Take 1.5 tablets (18.75 mg total) by mouth 2 (two) times daily with a meal. 540 tablet 3  . clopidogrel (PLAVIX) 75 MG tablet Take 1 tablet (75 mg total) by mouth daily with breakfast. 30 tablet 11  . Coenzyme Q10 (CO Q 10) 100 MG CAPS Take 1 tablet by mouth 2 (two) times daily.     . CRESTOR 20 MG tablet TAKE 1 TABLET EVERY DAY 90 tablet 1  . fenofibrate 160 MG tablet Take 160 mg by mouth at bedtime.     . furosemide (LASIX) 40 MG tablet Take 40 mg by mouth every morning.    . gabapentin (NEURONTIN) 300 MG capsule Take 300 mg by mouth 3 (three) times daily.    Marland Kitchen HYDROcodone-acetaminophen (NORCO) 5-325 MG per tablet Take 1 tablet by mouth every 6 (six) hours as needed. For joint and back pain.    Marland Kitchen insulin glargine (LANTUS)  100 UNIT/ML injection Inject 0.7 mLs (70 Units total) into the skin at bedtime. 15 mL 0  . Insulin Pen Needle (FIFTY50 PEN NEEDLES) 31G X 5 MM MISC Use 4x a day 100 each prn  . insulin regular (NOVOLIN R,HUMULIN R) 100 units/mL injection Inject 24-28 units into the skin 3 times daily. Dx code; E13.9 70 mL 3  . levothyroxine (SYNTHROID, LEVOTHROID) 75 MCG tablet Take 75 mcg by mouth daily before breakfast.     . losartan (COZAAR) 50 MG tablet     . metFORMIN (GLUCOPHAGE) 1000 MG tablet Take 1,000 mg by mouth 2 (two) times daily with a meal.    . metoCLOPramide (REGLAN) 10 MG tablet Take 10 mg by mouth 4 (four) times daily.     . montelukast (SINGULAIR) 10 MG tablet Take 10 mg by mouth at bedtime.    . nitroGLYCERIN (NITROSTAT) 0.4 MG SL tablet Place 1 tablet (0.4 mg total) under the tongue every 5 (five) minutes as needed for chest pain. 25 tablet 3  . pantoprazole (PROTONIX) 40 MG tablet Take 1 tablet (40 mg total) by mouth  daily. 30 tablet 1  . polyethylene glycol (MIRALAX / GLYCOLAX) packet Take 17 g by mouth daily.    Marland Kitchen topiramate (TOPAMAX) 50 MG tablet Take 50 mg by mouth 2 (two) times daily.    . Vitamin D, Ergocalciferol, (DRISDOL) 50000 UNITS CAPS Take 50,000 Units by mouth every 7 (seven) days. Take on thursdays     No current facility-administered medications for this visit.    Allergies:   Invokana and Nsaids    Social History:  The patient  reports that she has never smoked. She has never used smokeless tobacco. She reports that she does not drink alcohol or use illicit drugs.   Family History:  The patient's family history includes COPD in her father; Cirrhosis in her sister; Colon cancer in an other family member; Diabetes in her brother and mother; Emphysema in her father; Heart attack in her mother and paternal grandfather; Heart failure in her brother and mother; Hypertension in her brother; Kidney disease in her mother; Liver disease in her sister; Lung cancer in her brother; Other in an other family member; Stroke in her father.    ROS:  Please see the history of present illness.   Otherwise, review of systems are positive for still not sleeping well but is on Cipro for sleep apnea. Possibility reduced medication regimen..   All other systems are reviewed and negative.    PHYSICAL EXAM: VS:  BP 110/50 mmHg  Pulse 86  Ht 5\' 1"  (1.549 m)  Wt 226 lb 3.2 oz (102.604 kg)  BMI 42.76 kg/m2 , BMI Body mass index is 42.76 kg/(m^2). GEN: Well nourished, well developed, in no acute distress. Morbid obesity HEENT: normal Neck: no JVD, carotid bruits, or masses Cardiac: RRR; no murmurs, rubs, or gallops,no edema  Respiratory:  clear to auscultation bilaterally, normal work of breathing GI: soft, nontender, nondistended, + BS MS: no deformity or atrophy Skin: warm and dry, no rash Neuro:  Strength and sensation are intact Psych: euthymic mood, full affect   EKG:  EKG is ordered today. The ekg  ordered today demonstrates incomplete right bundle branch block, inferior infarct, in normal sinus rhythm   Recent Labs: No results found for requested labs within last 365 days.    Lipid Panel    Component Value Date/Time   CHOL 115 10/27/2013 0307   TRIG 236* 10/27/2013 0307   HDL  21* 10/27/2013 0307   CHOLHDL 5.5 10/27/2013 0307   VLDL 47* 10/27/2013 0307   LDLCALC 47 10/27/2013 0307      Wt Readings from Last 3 Encounters:  10/30/14 226 lb 3.2 oz (102.604 kg)  09/18/14 223 lb (101.152 kg)  09/11/14 220 lb (99.791 kg)      Other studies Reviewed: Additional studies/ records that were reviewed today include: . Review of the above records demonstrates: View the discharge summary and medication adjustments made at the time of discharge one year ago   ASSESSMENT AND PLAN:  1. 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 No recurrence of angina. 2. Obstructive sleep apnea, now being treated with sleep apnea 3. Hypertension but now with a relative low pressures at times. 4. Acute systolic heart failure resolved LVEF of 50% on medical therapy post infarct. 5. Hyperlipidemia followed by primary care   Current medicines are reviewed at length with the patient today.  The patient does have concerns regarding medicines. She feels she is on too many.  The following changes have been made:  Discontinue Plavix, decrease carvedilol to 12.5 mg twice a day, decrease furosemide to 20 mg per day. She will need to have a 2 month follow-up to make sure her blood pressure is doing okay on the reduced medication regimen.  Labs/ tests ordered today include:  No orders of the defined types were placed in this encounter.     Disposition:   FU with Linard Millers in 1 Year. Extender in 2 months to make sure the medication adjustments have not allowed significant hypertension.  Signed, Sinclair Grooms, MD  10/30/2014 3:53 PM    [address], [place], [place]  [address] Phone: [phone]; Fax: [phone]

## 2014-10-30 NOTE — Patient Instructions (Addendum)
Your physician has recommended you make the following change in your medication:  1) STOP Plavix 2) DECREASE Carvedilol to 12.5 mg twice daily 3) DECREASE Furosemide to 20mg  daily  Your physician recommends that you schedule a follow-up appointment in: 2 months with the PA/NP  Your physician wants you to follow-up in: 1 year with Dr.Smith You will receive a reminder letter in the mail two months in advance. If you don't receive a letter, please call our office to schedule the follow-up appointment.

## 2014-11-01 ENCOUNTER — Encounter: Payer: Self-pay | Admitting: Podiatry

## 2014-11-01 ENCOUNTER — Ambulatory Visit (INDEPENDENT_AMBULATORY_CARE_PROVIDER_SITE_OTHER): Payer: Medicare Other | Admitting: Podiatry

## 2014-11-01 VITALS — BP 144/58 | HR 89 | Resp 18

## 2014-11-01 DIAGNOSIS — B351 Tinea unguium: Secondary | ICD-10-CM

## 2014-11-01 DIAGNOSIS — M79676 Pain in unspecified toe(s): Secondary | ICD-10-CM | POA: Diagnosis not present

## 2014-11-01 DIAGNOSIS — I2510 Atherosclerotic heart disease of native coronary artery without angina pectoris: Secondary | ICD-10-CM

## 2014-11-01 NOTE — Patient Instructions (Signed)
Diabetes and Foot Care Diabetes may cause you to have problems because of poor blood supply (circulation) to your feet and legs. This may cause the skin on your feet to become thinner, break easier, and heal more slowly. Your skin may become dry, and the skin may peel and crack. You may also have nerve damage in your legs and feet causing decreased feeling in them. You may not notice minor injuries to your feet that could lead to infections or more serious problems. Taking care of your feet is one of the most important things you can do for yourself.  HOME CARE INSTRUCTIONS  Wear shoes at all times, even in the house. Do not go barefoot. Bare feet are easily injured.  Check your feet daily for blisters, cuts, and redness. If you cannot see the bottom of your feet, use a mirror or ask someone for help.  Wash your feet with warm water (do not use hot water) and mild soap. Then pat your feet and the areas between your toes until they are completely dry. Do not soak your feet as this can dry your skin.  Apply a moisturizing lotion or petroleum jelly (that does not contain alcohol and is unscented) to the skin on your feet and to dry, brittle toenails. Do not apply lotion between your toes.  Trim your toenails straight across. Do not dig under them or around the cuticle. File the edges of your nails with an emery board or nail file.  Do not cut corns or calluses or try to remove them with medicine.  Wear clean socks or stockings every day. Make sure they are not too tight. Do not wear knee-high stockings since they may decrease blood flow to your legs.  Wear shoes that fit properly and have enough cushioning. To break in new shoes, wear them for just a few hours a day. This prevents you from injuring your feet. Always look in your shoes before you put them on to be sure there are no objects inside.  Do not cross your legs. This may decrease the blood flow to your feet.  If you find a minor scrape,  cut, or break in the skin on your feet, keep it and the skin around it clean and dry. These areas may be cleansed with mild soap and water. Do not cleanse the area with peroxide, alcohol, or iodine.  When you remove an adhesive bandage, be sure not to damage the skin around it.  If you have a wound, look at it several times a day to make sure it is healing.  Do not use heating pads or hot water bottles. They may burn your skin. If you have lost feeling in your feet or legs, you may not know it is happening until it is too late.  Make sure your health care provider performs a complete foot exam at least annually or more often if you have foot problems. Report any cuts, sores, or bruises to your health care provider immediately. SEEK MEDICAL CARE IF:   You have an injury that is not healing.  You have cuts or breaks in the skin.  You have an ingrown nail.  You notice redness on your legs or feet.  You feel burning or tingling in your legs or feet.  You have pain or cramps in your legs and feet.  Your legs or feet are numb.  Your feet always feel cold. SEEK IMMEDIATE MEDICAL CARE IF:   There is increasing redness,   swelling, or pain in or around a wound.  There is a red line that goes up your leg.  Pus is coming from a wound.  You develop a fever or as directed by your health care provider.  You notice a bad smell coming from an ulcer or wound. Document Released: 08/22/2000 Document Revised: 04/27/2013 Document Reviewed: 02/01/2013 [employer] Patient Information 2015 [employer]. This information is not intended to replace advice given to you by your health care provider. Make sure you discuss any questions you have with your health care provider.  

## 2014-11-01 NOTE — Progress Notes (Signed)
   Subjective:    Patient ID: Danielle Vargas, female    DOB: [birth year], 64 y.o.   MRN: [id number]  HPI  64 year old female presents the office today with complaints of painful elongated toenails for which she is unable to trim herself. She denies any recent redness or drainage on nail sites. She states that she has pain to the toenails particularly with pressure. She also states that she has dry skin on the bottom of her feet. She said no prior treatment for this. No other complaints at this time.  She has diabetic and states she has been since 1999. She denies any history of ulceration or any intermittent claudication symptoms. She is previously been diagnosed with neuropathy for which she is taking gabapentin. No other complaints at this time.    Review of Systems  All other systems reviewed and are negative.      Objective:   Physical Exam AAO 3, NAD DP/PT pulses palpable, CRT less than 3 seconds Protective sensation appears to be grossly intact with Derrel Nip monofilament, vibratory sensation intact, Achilles tendon reflex intact. Nails are dystrophic, discolored, elongated, hypertrophic 10. There is subjective tenderness on the nails 1 through 5 bilaterally. There is no swelling erythema or drainage along the nail sites. There is dry skin on the plantar aspect of the feet bilaterally. No open lesions identified bilaterally. No pre-ulcerative lesions. No interdigital maceration. No other areas of tenderness to bilateral lower extremes. No one edema, erythema, increase in warmth. No pain with calf compression, swelling, warmth, erythema.     Assessment & Plan:  64 year old female symptomatic onychomycosis -Treatment options were discussed the patient clean alternatives, risks, complications. -Nail sharply debrided 10 without complication/bleeding. -Discussed the importance of daily foot inspection. -She can apply a small amount of moisturizer on the feet daily.  Recommended not to apply interdigitally. -Follow-up in 3 months or sooner if any problems are to arise. In the meantime, encouraged to call the office with any question's, concerns, change in symptoms.

## 2014-11-06 ENCOUNTER — Ambulatory Visit (INDEPENDENT_AMBULATORY_CARE_PROVIDER_SITE_OTHER): Payer: Medicare Other | Admitting: Ophthalmology

## 2014-11-09 ENCOUNTER — Ambulatory Visit: Payer: Medicare Other | Admitting: Internal Medicine

## 2014-11-13 ENCOUNTER — Encounter: Payer: Self-pay | Admitting: Internal Medicine

## 2014-11-13 ENCOUNTER — Other Ambulatory Visit: Payer: Self-pay | Admitting: *Deleted

## 2014-11-13 MED ORDER — FUROSEMIDE 20 MG PO TABS: 20.0000 mg | ORAL_TABLET | Freq: Every morning | ORAL | Status: DC

## 2014-11-13 MED ORDER — CARVEDILOL 12.5 MG PO TABS: 12.5000 mg | ORAL_TABLET | Freq: Two times a day (BID) | ORAL | Status: DC

## 2014-11-14 ENCOUNTER — Encounter: Payer: Self-pay | Admitting: Internal Medicine

## 2014-11-16 ENCOUNTER — Encounter: Payer: Self-pay | Admitting: Neurology

## 2014-12-08 ENCOUNTER — Ambulatory Visit (INDEPENDENT_AMBULATORY_CARE_PROVIDER_SITE_OTHER): Payer: Medicare Other | Admitting: Neurology

## 2014-12-08 ENCOUNTER — Encounter: Payer: Self-pay | Admitting: Neurology

## 2014-12-08 VITALS — BP 121/64 | HR 94 | Resp 18 | Ht 61.0 in | Wt 224.0 lb

## 2014-12-08 DIAGNOSIS — I2510 Atherosclerotic heart disease of native coronary artery without angina pectoris: Secondary | ICD-10-CM

## 2014-12-08 DIAGNOSIS — Z9989 Dependence on other enabling machines and devices: Secondary | ICD-10-CM

## 2014-12-08 DIAGNOSIS — E662 Morbid (severe) obesity with alveolar hypoventilation: Secondary | ICD-10-CM | POA: Diagnosis not present

## 2014-12-08 DIAGNOSIS — G4733 Obstructive sleep apnea (adult) (pediatric): Secondary | ICD-10-CM

## 2014-12-08 DIAGNOSIS — J310 Chronic rhinitis: Secondary | ICD-10-CM | POA: Insufficient documentation

## 2014-12-08 NOTE — Progress Notes (Signed)
[facility]   Provider:  Larey Seat, M D  Referring Provider: Marton Redwood, MD Primary Care Physician:  Marton Redwood, MD  Chief Complaint  Patient presents with  . OSA - CPAP    RM     HPI:  Danielle Vargas is a 64 y.o. female , seen here as a  NP referral   from Dr. Brigitte Pulse for  a sleep consultation.  09-18-14  Is followed by Dr. Maryla Morrow her endocrinologist, and for primary care followed by Dr. Carmie Kanner.  She is seen here  as a new patient after reporting to Dr. Brigitte Pulse that she is excessively daytime sleepy she also has increased pulmonary wheezing recently gets short of breath with any light activity or just carrying minor weight. She has been using albuterol more and more often and remains on Singulair in the past she was diagnosed with a cat her allergy but she no longer has suspect. Her allergies became better. But she reports here today is that she is in a significant amount of pain nearly every day that she has back pain and joint pain and also neuropathic sensory changes. She has not been able to lose any weight and by BMI is considered morbidly obese at this time. Her sleep has been non-restorative not refreshing for while. She states that she sleeps poorly partially due to pain and partially because she has to be up and down all night. She lives alone but is noted to snore. Her daughter and son-in-law have been visiting and noticed that she is snoring and irregularly breathing at night. This may be the cause for her excessive sleepiness during the day. She has often waking been waking up drowsy and was a dull headache. She is also taking hydrocodone for her back pain,  which she has been using for a while- this does however not provide her with enough coverage to sleep through the night The patient reports that she sleeps rarely more than 6 hours and almost always only 4 hours at night -she goes usually to bed at around 2:00 in the morning. She  has been  falling asleep on her sofa prior in the evening hours watching TV. So overall it is harder to judge how much sleep the patient truly gets, abut 6 hours she estimated.  The patient has mostly truncal obesity and father slender comparably extremities.She states that on her sofa, which is rather firm, she uses 2 pillows to prop her slightly up this seems to support her back and allows her to breathe better. In bed she uses only one pillow and no wedge and feels that she doesn't sleep as well and doesn't breathe as well. This probably could be helped by using a wedge to lift the head of bed up a little bit. She drinks caffeine in Twain form .  She goes to the bathroom 2-3 times at night.   The patient carries the additional diagnoses of fatty liver disease,  morbid obesity, chronic pain treated with narcotics. 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 She has hypertension, hyperlipidemia, osteoporosis, vitamin D deficiency, colon polyps, nephrolithiasis, and carries a diagnosis of depression. She had she had a C-spine fusion a right carpal tunnel repair and all non-nerve release and a left rotator cuff repair between  2002 and 2009. The patient lives alone she used to have cats which she does not longer keep due to her allergy potential, she has never been a smoker, she is not exposed to passive smoke. Family history is positive for alcoholism in her father who died of a stroke in his 51s and also COPD he was a heavy smoker as was her mother who suffers from diabetes mellitus type 2, peripheral vascular disease, emphysema, and congestive heart failure and died at age 73. A brother suffers from psoriasis and kidney stones a sister is status post liver transplant after hepatic  cirrhosis, her son and daughter are healthy.   Interval history from 12-08-14 Danielle Vargas is seen here today for a compliance visit she underwent a sleep study a split-night protocol on 1-30 1-16 her sleep study was performed because she endorsed the Epworth sleepiness score at 18 points she was also appearing severely depressed and her pH Q9 form was endorsed at 16 points. Her sleep study revealed the highest AHI I have seen in the last 12 month, 142.9. She did not sleep in supine position. CPAP was initiated at 6 cm water and advance to 15 cm water she did the best at about 13 cm pressure. The AHI was reduced to 16.3 overall and specifically at 12, 13 and 15 cm more effective to below 5. For this reason, I have ordered an hour to titrate her which means that her CPAP can self adjust in a window between 10 and 20 cm water. She used an Agricultural consultant nasal mask in the sleep lab. If she can tolerate the mask fairly well and is comfortable fit I would like her to continue. 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  The patient reports that she truly loves her CPAP machine. I have a download available for the last 30 days she is 100% compliant patient with days of use and 90% compliance for days of over 4 hours of use. Average user time is 5 hours 20 minutes, the machine is an AutoSet between 10 and 20 cm water pressure with 3 cm EPR her residual AHI is 0.6 a reduction of almost 100%. Her 95th percentile pressure is 13.3. The patient will be today that she would like to have a second machine available because she is living in a two-story house and often sleeps either upstairs or downstairs and it is difficult for her to ambulate and carry an object. I suggested that we can look for refill brisk machines all returned machines that were donated. I would like [facility] to keep an eye out for this as well as asked Larwance Sachs here  in the sleep lab if we are have any donation machines coming in. I can see that this would make it much easier for the patient. She likes the current nasal interface and I will not have to change that. I will schedule a revisit in 6 months and from there on yearly.   Review of Systems: Out of a complete 14 system review, the patient complains of only the following symptoms, and all other reviewed systems are negative.  The patient endorsed coughing, wheezing, shortness of breath, snoring apparently witnessed with occasional gaps in her nocturnal breathing.  Epworth score  12 from 17 , Fatigue severity score 57 from   63 points  ,    History   Social History  .  Marital Status: Divorced    Spouse Name: N/A  . Number of Children: 2  . Years of Education: N/A   Occupational History  . disabled    Social History Main Topics  . Smoking status: Never Smoker   . Smokeless tobacco: Never Used  . Alcohol Use: No  . Drug Use: No  . Sexual Activity: Not Currently    Birth Control/ Protection: Post-menopausal   Other Topics Concern  . Not on file   Social History Narrative   Lives in [facility] alone.  Retired.  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   Regular exercise: none   Caffeine use: daily; dt coke             Family History  Problem Relation Age of Onset  . COPD Father   . Stroke Father   . Heart failure Brother   . Heart failure Mother   . Hypertension Brother   . Heart attack Mother     several  . Colon cancer      mat. 1st cousin  . Other      celiac sprue, 1/2 brother  . Diabetes Mother   . Diabetes Brother     x 3  . Lung cancer Brother   . Cirrhosis Sister     liver transplant  . Kidney disease Mother   . Emphysema Father   . Liver disease Sister     transplant  . Heart attack Paternal Grandfather   . Heart attack Brother     Past Medical History  Diagnosis Date  . DM (diabetes mellitus)   . Obesity   . Dyslipidemia   . HTN  (hypertension)   . Hypothyroidism   . Gastroparesis   . Migraine   . Osteoporosis   . Colon polyps 7/07    hyperplastic and adenomatous  . Depression   . Nephrolithiasis   . Diverticulosis   . External hemorrhoid   . Fatty liver   . Asthma   . CAD (coronary artery disease)     a. 12/2008 Cath: mild irregs throughout, EF 75%.  . Hypersomnia with sleep apnea 09/18/2014    Past Surgical History  Procedure Laterality Date  . Tubal ligation  '85  . Breast reduction surgery  '88  . Cervical disc surgery  '98    fusion  . Carpal tunnel release  2000    right wrist  . Rotator cuff repair  '09    left shoulder  . Ulnar nerve repair  '02, '08    left done, then right  . Cesarean section  '79  '84  . Left heart catheterization with coronary angiogram N/A 10/26/2013    Procedure: LEFT HEART CATHETERIZATION WITH CORONARY ANGIOGRAM;  Surgeon: Sinclair Grooms, MD;  Location: [facility] CATH LAB;  Service: Cardiovascular;  Laterality: N/A;    Current Outpatient Prescriptions  Medication Sig Dispense Refill  . albuterol (PROVENTIL HFA;VENTOLIN HFA) 108 (90 BASE) MCG/ACT inhaler Inhale 2 puffs into the lungs every 6 (six) hours as needed. For shortness of breath.    Marland Kitchen amLODipine (NORVASC) 5 MG tablet Take 5 mg by mouth every morning.     Marland Kitchen aspirin EC 81 MG tablet Take 81 mg by mouth every morning.    . calcium-vitamin D (CALCIUM 500/D) 500-200 MG-UNIT per tablet Take 1 tablet by mouth every evening.     . carisoprodol (SOMA) 350 MG tablet Take 350 mg by mouth 4 (four) times daily.     Marland Kitchen  carvedilol (COREG) 12.5 MG tablet Take 1 tablet (12.5 mg total) by mouth 2 (two) times daily with a meal. 180 tablet 3  . Coenzyme Q10 (CO Q 10) 100 MG CAPS Take 1 tablet by mouth 2 (two) times daily.     . CRESTOR 20 MG tablet TAKE 1 TABLET EVERY DAY 90 tablet 1  . fenofibrate 160 MG tablet Take 160 mg by mouth at bedtime.     . furosemide (LASIX) 20 MG tablet Take 1 tablet (20 mg total) by mouth every morning. 90  tablet 3  . gabapentin (NEURONTIN) 300 MG capsule Take 300 mg by mouth 3 (three) times daily.    Marland Kitchen HYDROcodone-acetaminophen (NORCO) 5-325 MG per tablet Take 1 tablet by mouth every 6 (six) hours as needed. For joint and back pain.    Marland Kitchen insulin glargine (LANTUS) 100 UNIT/ML injection Inject 0.7 mLs (70 Units total) into the skin at bedtime. (Patient taking differently: Inject 70 Units into the skin at bedtime. 30 units am and 40 units at bedtime.) 15 mL 0  . Insulin Pen Needle (FIFTY50 PEN NEEDLES) 31G X 5 MM MISC Use 4x a day (Patient taking differently: Use 5 x daily.) 100 each prn  . insulin regular (NOVOLIN R,HUMULIN R) 100 units/mL injection Inject 24-28 units into the skin 3 times daily. Dx code; E13.9 70 mL 3  . levothyroxine (SYNTHROID, LEVOTHROID) 75 MCG tablet Take 75 mcg by mouth daily before breakfast.     . losartan (COZAAR) 50 MG tablet     . metFORMIN (GLUCOPHAGE) 1000 MG tablet Take 1,000 mg by mouth 2 (two) times daily with a meal.    . metoCLOPramide (REGLAN) 10 MG tablet Take 10 mg by mouth 4 (four) times daily.     . montelukast (SINGULAIR) 10 MG tablet Take 10 mg by mouth at bedtime.    . nitroGLYCERIN (NITROSTAT) 0.4 MG SL tablet Place 1 tablet (0.4 mg total) under the tongue every 5 (five) minutes as needed for chest pain. 25 tablet 3  . omeprazole (PRILOSEC) 20 MG capsule Take 20 mg by mouth 2 (two) times daily before a meal.    . polyethylene glycol (MIRALAX / GLYCOLAX) packet Take 17 g by mouth daily.    Marland Kitchen topiramate (TOPAMAX) 50 MG tablet Take 50 mg by mouth 2 (two) times daily.    . Vitamin D, Ergocalciferol, (DRISDOL) 50000 UNITS CAPS Take 50,000 Units by mouth every 7 (seven) days. Take on thursdays     No current facility-administered medications for this visit.    Allergies as of 12/08/2014 - Review Complete 12/08/2014  Allergen Reaction Noted  . Invokana [canagliflozin] Diarrhea 09/02/2013  . Nsaids Other (See Comments) 02/08/2012    Vitals: BP 121/64 mmHg   Pulse 94  Resp 18  Ht 5\' 1"  (1.549 m)  Wt 224 lb (101.606 kg)  BMI 42.35 kg/m2 Last Weight:  Wt Readings from Last 1 Encounters:  12/08/14 224 lb (101.606 kg)       Last Height:   Ht Readings from Last 1 Encounters:  12/08/14 5\' 1"  (1.549 m)    Physical exam:  General: The patient is awake, alert and appears not in acute distress. The patient is well groomed. Head: Normocephalic, atraumatic. Neck is supple. Mallampati 3  , Goiter .  Anterior fusion scar. 1998 neck circumference: large 16.75 inches.  Nasal airflow unrestricted  TMJ is not evident . Retrognathia is seen.  Cardiovascular:  Regular rate and rhythm , without  murmurs or carotid  bruit, and without distended neck veins. Respiratory: Lungs : wheezing with  auscultation. Skin:  Without evidence of edema, or rash Trunk: BMI is severely  elevated and patient  has a  Hunched posture, she carries an enormous pinnus.  Neurologic exam : The patient is awake and alert, oriented to place and time.   Memory subjective  described as intact. There is a normal attention span & concentration ability. Speech is fluent without Dysarthria, but with dysphonia -no  Aphasia.  Mood and affect are normal.  . Cranial nerves: Pupils are equal, small ,  not reactive to light.  Extraocular movements  in vertical and horizontal planes intact and without nystagmus. Visual fields by finger perimetry are intact. Hearing to finger rub intact. Facial motor strength is symmetric and tongue and uvula move midline. Motor exam:  Normal tone, muscle bulk and symmetric, strength in all extremities. She has small extremities and her obesity is strongly truncal and abdominal,  Sensory:  Fine touch, pinprick and vibration were tested in all extremities.  Coordination: Rapid alternating movements in the fingers/hands is normal.  Finger-to-nose maneuver normal without evidence of ataxia, dysmetria or tremor.  Gait and station: Patient walks without assistive  device and is able unassisted to climb up to the exam table. Strength within normal limits. Stance is stable and normal.  TDeep tendon reflexes: in the  upper and lower extremities are symmetric, unusually brisk for DM. No clonus.  Babinski maneuver response is equivocal ,  downgoing.   Assessment:  After physical and neurologic examination, review of laboratory studies, imaging, neurophysiology testing and pre-existing records, assessment is  1)Patient with morbid obesity, shortness of breath and morning headaches, nocturia and poor sleep hygiene, worsened by narcotic use. hypoventilation, severest OSA .   The patient was advised of the nature of the diagnosed sleep disorder , the treatment options and risks for general a health and wellness arising from not treating the condition. Visit duration was 25 minutes.   Over 50% of our visit time face-to-face were spent in discussion of the patient's past medical history, obtaining a differential diagnosis through system review, discussing the possible diagnosis and the treatment options.  Plan:  Treatment plan and additional workup : as outlined above.      Asencion Partridge Wray Goehring MD  12/08/2014

## 2014-12-11 ENCOUNTER — Ambulatory Visit: Payer: Medicare Other | Admitting: Physician Assistant

## 2014-12-21 ENCOUNTER — Ambulatory Visit (INDEPENDENT_AMBULATORY_CARE_PROVIDER_SITE_OTHER): Payer: Medicare Other | Admitting: Ophthalmology

## 2014-12-21 ENCOUNTER — Encounter: Payer: Self-pay | Admitting: Internal Medicine

## 2014-12-25 ENCOUNTER — Ambulatory Visit (INDEPENDENT_AMBULATORY_CARE_PROVIDER_SITE_OTHER): Payer: Medicare Other | Admitting: Internal Medicine

## 2014-12-25 ENCOUNTER — Encounter: Payer: Self-pay | Admitting: Internal Medicine

## 2014-12-25 VITALS — BP 112/84 | HR 84 | Temp 97.9°F | Resp 12 | Wt 223.0 lb

## 2014-12-25 DIAGNOSIS — E139 Other specified diabetes mellitus without complications: Secondary | ICD-10-CM

## 2014-12-25 NOTE — Patient Instructions (Signed)
Please continue: - Lantus 40 in am and 30 at night  Increase Humulin R U100 to: - 30 units (small meal)  - 34 units (larger meal)   Continue Sliding scale Humulin insulin to the mealtime insulin: - 150- 165: + 1 unit  - 166- 180: + 2 units  - 181- 195: + 3 units  - 196- 210: + 4 units  - >210: + 5 units   Try a low fat diet.   Please stop at the lab.

## 2014-12-25 NOTE — Progress Notes (Signed)
Patient ID: Danielle Vargas, female   DOB: 1951/05/13, 64 y.o.   MRN: [id number]  HPI: Danielle Vargas is a 64 y.o.-year-old woman, returning for f/u for of DM - LADA dx 1999, insulin-dependent since 10/2012, uncontrolled, with complications (CAD- s/p MI 10/2013, gastroparesis, peripheral neuropathy, OU DR). Last visit 3 mo ago. She has [name].  She was recently dx'ed with OSA >> started CPAP 2 mo ago.   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  Previous regimen: - Metformin 1000 mg bid  - U500 0.11-0.11-0.10 units before meals >> 0.09 mL tid  She has been on Glipizide in the past, too. She tried Invokana in the past >> severe diarrhea  She is now on: Lantus 60 >> 70 units at bedtime (40 in am and 30 at night) (vial - prefers a vial). Humulin R U100: - 22 >> 24 units (small meal)  - 26  >> 28 units (larger meal)  Sliding scale Humulin insulin to the mealtime insulin: - 150- 165: + 1 unit  - 166- 180: + 2 units  - 181- 195: + 3 units  - 196- 210: + 4 units  - >210: + 5 units   Pt checks her sugars 3-4x a day - brings meter >> downloaded: - am: 96-194 (104-130) >> 133-261 >> 84-209 >> 91, 138-215, 250 >> 116-228 >> 116, 149-201, 247 - 2h after b'fast: 155-191 >> 74-188 >> 109-244 >> 129-228 >> 136, 194-301 >> 134-246 >> 152-266 - before lunch: 68-166 >>107-236 >> 74-227 >> 151-269, 288 >> 104, 109-203, 244 >> 133-226 - 2h after lunch:82-238 >> 81-182 (2x: 244,273) >>165-209, 264 >> 93-223, 251 >> 188-222 - before dinner: 71-178 (191) >> 137, 142-229, 247 >> 146-239 >> 201-252 - 2h after dinner: 149-197 >> 68-265 >> 174-267 >> 193-260, 322 >> 108-202, 266 >> 128-243 -  bedtime: 91-330 >> 87-230 >> 118, 163-275 >> 111-242, 300 >> 147-223 - nighttime: 161-310 >> n/c >> 75-232 >> 136-291 >> n/c few lows - lowest 66 x 1; she has hypoglycemia awareness at 95.  Highest sugar was 300s.  Pt's meals are: - Breakfast: 2 slices of bread + PB - Lunch: sandwich;  salad + cheese + croutons;  baked potato >> pinto beans + cornbread - Dinner: leftovers or corn bread + pinto beans + occas. Cole slaw >> salad + lite dressing + saltines Not soft drinks anymore. Snacks at night  - has mild CKD, last BUN/creatinine:  Lab Results  Component Value Date   BUN 27* 10/29/2013   CREATININE 1.00 10/29/2013  She is back on Losartan. - pt has HL:  Lab Results  Component Value Date   CHOL 115 10/27/2013   HDL 21* 10/27/2013   LDLCALC 47 10/27/2013   TRIG 236* 10/27/2013   CHOLHDL 5.5 10/27/2013  She is on Fenofibrate. - last eye exam was in 01/2014 (unchanged) - Dr Zigmund Daniel. R>L DR.  - + numbness and tingling in her feet. On neurontin.  She also has a history of HTN, HL, fatty liver, GERD, morbid obesity, hypothyroidism, migraines, chronic back pain, history of nephrolithiasis, osteoporosis, diverticulosis.  I reviewed pt's medications, allergies, PMH, social hx, family hx, and changes were documented in the history of present illness. Otherwise, unchanged from my initial visit note.  ROS: Constitutional: no weight gain/loss, no fatigue, no subjective hyperthermia/hypothermia Eyes: no blurry vision, no xerophthalmia ENT: no sore throat, no nodules palpated in throat, no dysphagia/odynophagia, no hoarseness Cardiovascular: no CP/SOB/palpitations/leg swelling Respiratory: no cough/SOB/wheezing Gastrointestinal: no N/V/D/C Musculoskeletal: no muscle/joint aches Skin: no rashes Neurological: no tremors/numbness/tingling/dizziness  PE: BP 112/84 mmHg  Pulse 84  Temp(Src) 97.9 F (36.6 C) (Oral)  Resp 12  Wt 223 lb (101.152 kg)  SpO2 97% Wt Readings from Last 3  Encounters:  12/25/14 223 lb (101.152 kg)  12/08/14 224 lb (101.606 kg)  10/30/14 226 lb 3.2 oz (102.604 kg)   Constitutional: overweight, in NAD Eyes: PERRLA, EOMI, no exophthalmos ENT: moist mucous membranes, no thyromegaly, no cervical lymphadenopathy Cardiovascular: RRR, No MRG Respiratory: CTA B Gastrointestinal: abdomen soft, NT, ND, BS+ Musculoskeletal: no deformities, strength intact in all 4 Skin: moist, warm, no rashes  ASSESSMENT: 1. DM2, insulin-dependent, uncontrolled, with complications - CAD - AMI 10/26/2013 - had angioplasty, no stent placed. Had 2DEcho on 01/23/2014 EF increased (from 35) to 55%. - peripheral neuropathy, on Neurontin - Gastroparesis, on Reglan - Dr. 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 Patient with long-standing diabetes - sugars still above goal at all times of the day, but some good sugars after eating salads. Will stay with Lantus as Toujeo not covered. Will increase her mealtime insulin.  -  I suggested:  Patient Instructions  Please increase Lantus to 70 units at bedtime (you can inject 35 units 2x a day). Continue HumulinR U100: - 30 units (small meal)  - 34 units (larger meal)  Continue sliding scale Humulin insulin: - 150- 165: + 1 unit  - 166- 180: + 2 units  - 181- 195: + 3 units  - 196- 210: + 4 units  - >210: + 5 units   - continue checking sugars at different times of the day and write them down - she is doing a great job with this - will check HbA1c and fructosamine today - recommended a low fat diet - Return to clinic in 1.5 mo with sugar log   Office Visit on 12/25/2014  Component Date Value Ref Range Status  . Fructosamine 12/25/2014 286* 190 - 270 umol/L Final  . Hgb A1c MFr Bld 12/25/2014 7.4* 4.6 - 6.5 % Final   Glycemic Control Guidelines for People with Diabetes:Non Diabetic:  <6%Goal of Therapy: <7%Additional Action Suggested:  >8%    The hemoglobin A1c calculated from the fructosamine is lower, and 6.5%. This  is excellent, but does not completely correlate with her sugar log. We'll continue with the changes suggested above.

## 2014-12-26 LAB — HEMOGLOBIN A1C: HEMOGLOBIN A1C: 7.4 % — AB (ref 4.6–6.5)

## 2014-12-29 LAB — FRUCTOSAMINE: FRUCTOSAMINE: 286 umol/L — AB (ref 190–270)

## 2015-01-01 ENCOUNTER — Ambulatory Visit: Payer: Medicare Other | Admitting: Physician Assistant

## 2015-01-05 ENCOUNTER — Encounter: Payer: Medicare Other | Admitting: Internal Medicine

## 2015-01-18 ENCOUNTER — Ambulatory Visit (AMBULATORY_SURGERY_CENTER): Payer: Self-pay | Admitting: *Deleted

## 2015-01-18 VITALS — Ht 61.0 in | Wt 227.8 lb

## 2015-01-18 DIAGNOSIS — Z86010 Personal history of colonic polyps: Secondary | ICD-10-CM

## 2015-01-18 NOTE — Progress Notes (Signed)
No egg or soy allergy  No anesthesia or intubation problems per pt  No diet medications taken  Registered in EMMI   

## 2015-01-24 ENCOUNTER — Telehealth: Payer: Self-pay | Admitting: Neurology

## 2015-01-24 ENCOUNTER — Ambulatory Visit: Payer: Medicare Other | Admitting: Physician Assistant

## 2015-01-24 NOTE — Telephone Encounter (Signed)
Danielle Mourning, MS Mahurin called to say she missed your call so she is returning your call because there wasn't a message left for her.

## 2015-01-24 NOTE — Telephone Encounter (Signed)
Spoke to pt. She has already had her face to face with Dr Dohmeier following the start of her cpap for insurance purposes.

## 2015-01-25 ENCOUNTER — Telehealth: Payer: Self-pay | Admitting: Internal Medicine

## 2015-01-25 MED ORDER — INSULIN REGULAR HUMAN 100 UNIT/ML IJ SOLN: INTRAMUSCULAR | Status: DC

## 2015-01-25 NOTE — Telephone Encounter (Signed)
Done

## 2015-01-25 NOTE — Telephone Encounter (Signed)
Patient called stating that she would like to have her Rx sent to her pharmacy   Rx: Humulin R 100  Pharmacy: [facility]   Please advise   Thank you

## 2015-01-25 NOTE — Telephone Encounter (Signed)
Patient stated that wrong dosage humilin was called into her pharmacy, correct dosage is Humilin 30-34 units.

## 2015-01-25 NOTE — Telephone Encounter (Signed)
Corrected dosage and resent to [facility].

## 2015-01-30 ENCOUNTER — Encounter: Payer: Medicare Other | Admitting: Internal Medicine

## 2015-01-31 ENCOUNTER — Ambulatory Visit: Payer: Medicare Other | Admitting: Podiatry

## 2015-02-08 ENCOUNTER — Ambulatory Visit (INDEPENDENT_AMBULATORY_CARE_PROVIDER_SITE_OTHER): Payer: Medicare Other | Admitting: Ophthalmology

## 2015-02-19 ENCOUNTER — Ambulatory Visit (INDEPENDENT_AMBULATORY_CARE_PROVIDER_SITE_OTHER): Payer: Medicare Other | Admitting: Ophthalmology

## 2015-02-19 DIAGNOSIS — H35033 Hypertensive retinopathy, bilateral: Secondary | ICD-10-CM | POA: Diagnosis not present

## 2015-02-19 DIAGNOSIS — E11329 Type 2 diabetes mellitus with mild nonproliferative diabetic retinopathy without macular edema: Secondary | ICD-10-CM

## 2015-02-19 DIAGNOSIS — H43813 Vitreous degeneration, bilateral: Secondary | ICD-10-CM

## 2015-02-19 DIAGNOSIS — I10 Essential (primary) hypertension: Secondary | ICD-10-CM | POA: Diagnosis not present

## 2015-02-19 DIAGNOSIS — E11319 Type 2 diabetes mellitus with unspecified diabetic retinopathy without macular edema: Secondary | ICD-10-CM | POA: Diagnosis not present

## 2015-02-19 DIAGNOSIS — H2513 Age-related nuclear cataract, bilateral: Secondary | ICD-10-CM | POA: Diagnosis not present

## 2015-02-21 ENCOUNTER — Ambulatory Visit: Payer: Medicare Other | Admitting: Physician Assistant

## 2015-02-23 ENCOUNTER — Ambulatory Visit: Payer: Medicare Other | Admitting: Podiatry

## 2015-03-05 ENCOUNTER — Other Ambulatory Visit: Payer: Self-pay

## 2015-03-05 ENCOUNTER — Telehealth: Payer: Self-pay | Admitting: Internal Medicine

## 2015-03-05 MED ORDER — INSULIN PEN NEEDLE 31G X 5 MM MISC: Status: DC

## 2015-03-05 NOTE — Telephone Encounter (Signed)
Pt needs syringes called in to [facility] 90 day supply she takes 5 inj daily

## 2015-03-05 NOTE — Telephone Encounter (Signed)
Rx sent per pt's request.  

## 2015-03-07 ENCOUNTER — Telehealth: Payer: Self-pay | Admitting: Internal Medicine

## 2015-03-12 ENCOUNTER — Encounter: Payer: Self-pay | Admitting: Internal Medicine

## 2015-03-13 ENCOUNTER — Other Ambulatory Visit: Payer: Self-pay | Admitting: Internal Medicine

## 2015-03-14 ENCOUNTER — Telehealth: Payer: Self-pay | Admitting: Internal Medicine

## 2015-03-14 ENCOUNTER — Other Ambulatory Visit: Payer: Self-pay | Admitting: *Deleted

## 2015-03-14 MED ORDER — INSULIN SYRINGE 31G X 5/16" 1 ML MISC
Status: DC
Start: 2015-03-14 — End: 2015-04-07

## 2015-03-14 NOTE — Telephone Encounter (Signed)
Pt called stating she needs syringes instead of pen needles. Pt no longer uses pens only vials. Deleted pen needles from med list.

## 2015-03-14 NOTE — Telephone Encounter (Signed)
Called pt. Pt needs syringes instead of pen needles. Pt only uses Vials. Rx for insulin syringes 74ml x 43mm x 31g sent to [facility] for pt.

## 2015-03-14 NOTE — Telephone Encounter (Signed)
Pt called and needs Larene Beach to call her regarding wrong medication called in

## 2015-03-21 ENCOUNTER — Emergency Department (HOSPITAL_COMMUNITY): Payer: Medicare Other

## 2015-03-21 ENCOUNTER — Emergency Department (HOSPITAL_COMMUNITY)
Admission: EM | Admit: 2015-03-21 | Discharge: 2015-03-22 | Disposition: A | Discharge status: Home / Self Care | Payer: Medicare Other | Attending: Emergency Medicine | Admitting: Emergency Medicine

## 2015-03-21 ENCOUNTER — Encounter (HOSPITAL_COMMUNITY): Payer: Self-pay | Admitting: *Deleted

## 2015-03-21 DIAGNOSIS — M810 Age-related osteoporosis without current pathological fracture: Secondary | ICD-10-CM | POA: Insufficient documentation

## 2015-03-21 DIAGNOSIS — J45909 Unspecified asthma, uncomplicated: Secondary | ICD-10-CM | POA: Diagnosis not present

## 2015-03-21 DIAGNOSIS — Z880 Allergy status to penicillin: Secondary | ICD-10-CM | POA: Insufficient documentation

## 2015-03-21 DIAGNOSIS — G43909 Migraine, unspecified, not intractable, without status migrainosus: Secondary | ICD-10-CM | POA: Insufficient documentation

## 2015-03-21 DIAGNOSIS — I10 Essential (primary) hypertension: Secondary | ICD-10-CM | POA: Insufficient documentation

## 2015-03-21 DIAGNOSIS — I2510 Atherosclerotic heart disease of native coronary artery without angina pectoris: Secondary | ICD-10-CM | POA: Diagnosis not present

## 2015-03-21 DIAGNOSIS — Z79899 Other long term (current) drug therapy: Secondary | ICD-10-CM | POA: Insufficient documentation

## 2015-03-21 DIAGNOSIS — E039 Hypothyroidism, unspecified: Secondary | ICD-10-CM | POA: Diagnosis not present

## 2015-03-21 DIAGNOSIS — E119 Type 2 diabetes mellitus without complications: Secondary | ICD-10-CM | POA: Diagnosis not present

## 2015-03-21 DIAGNOSIS — Z7982 Long term (current) use of aspirin: Secondary | ICD-10-CM | POA: Insufficient documentation

## 2015-03-21 DIAGNOSIS — Z9889 Other specified postprocedural states: Secondary | ICD-10-CM | POA: Insufficient documentation

## 2015-03-21 DIAGNOSIS — F329 Major depressive disorder, single episode, unspecified: Secondary | ICD-10-CM | POA: Diagnosis not present

## 2015-03-21 DIAGNOSIS — H269 Unspecified cataract: Secondary | ICD-10-CM | POA: Insufficient documentation

## 2015-03-21 DIAGNOSIS — Z9981 Dependence on supplemental oxygen: Secondary | ICD-10-CM | POA: Insufficient documentation

## 2015-03-21 DIAGNOSIS — Z86010 Personal history of colonic polyps: Secondary | ICD-10-CM | POA: Insufficient documentation

## 2015-03-21 DIAGNOSIS — I252 Old myocardial infarction: Secondary | ICD-10-CM | POA: Diagnosis not present

## 2015-03-21 DIAGNOSIS — G4730 Sleep apnea, unspecified: Secondary | ICD-10-CM | POA: Insufficient documentation

## 2015-03-21 DIAGNOSIS — R1011 Right upper quadrant pain: Secondary | ICD-10-CM | POA: Diagnosis present

## 2015-03-21 DIAGNOSIS — E669 Obesity, unspecified: Secondary | ICD-10-CM | POA: Insufficient documentation

## 2015-03-21 DIAGNOSIS — Z794 Long term (current) use of insulin: Secondary | ICD-10-CM | POA: Diagnosis not present

## 2015-03-21 DIAGNOSIS — R109 Unspecified abdominal pain: Secondary | ICD-10-CM

## 2015-03-21 DIAGNOSIS — Z87442 Personal history of urinary calculi: Secondary | ICD-10-CM | POA: Insufficient documentation

## 2015-03-21 DIAGNOSIS — E785 Hyperlipidemia, unspecified: Secondary | ICD-10-CM | POA: Diagnosis not present

## 2015-03-21 DIAGNOSIS — N390 Urinary tract infection, site not specified: Secondary | ICD-10-CM | POA: Diagnosis not present

## 2015-03-21 DIAGNOSIS — Z8719 Personal history of other diseases of the digestive system: Secondary | ICD-10-CM | POA: Diagnosis not present

## 2015-03-21 LAB — CBC
HCT: 33.5 % — ABNORMAL LOW (ref 36.0–46.0)
Hemoglobin: 10.4 g/dL — ABNORMAL LOW (ref 12.0–15.0)
MCH: 25.9 pg — ABNORMAL LOW (ref 26.0–34.0)
MCHC: 31 g/dL (ref 30.0–36.0)
MCV: 83.3 fL (ref 78.0–100.0)
Platelets: 306 10*3/uL (ref 150–400)
RBC: 4.02 MIL/uL (ref 3.87–5.11)
RDW: 16.4 % — AB (ref 11.5–15.5)
WBC: 8.4 10*3/uL (ref 4.0–10.5)

## 2015-03-21 LAB — URINALYSIS, ROUTINE W REFLEX MICROSCOPIC
Bilirubin Urine: NEGATIVE
GLUCOSE, UA: NEGATIVE mg/dL
Hgb urine dipstick: NEGATIVE
KETONES UR: NEGATIVE mg/dL
NITRITE: POSITIVE — AB
PH: 5 (ref 5.0–8.0)
Protein, ur: NEGATIVE mg/dL
Specific Gravity, Urine: 1.012 (ref 1.005–1.030)
Urobilinogen, UA: 0.2 mg/dL (ref 0.0–1.0)

## 2015-03-21 LAB — COMPREHENSIVE METABOLIC PANEL
ALBUMIN: 3.7 g/dL (ref 3.5–5.0)
ALT: 21 U/L (ref 14–54)
AST: 29 U/L (ref 15–41)
Alkaline Phosphatase: 47 U/L (ref 38–126)
Anion gap: 9 (ref 5–15)
BUN: 14 mg/dL (ref 6–20)
CO2: 24 mmol/L (ref 22–32)
CREATININE: 1.1 mg/dL — AB (ref 0.44–1.00)
Calcium: 9.6 mg/dL (ref 8.9–10.3)
Chloride: 102 mmol/L (ref 101–111)
GFR calc Af Amer: >60 mL/min (ref >60)
GFR calc non Af Amer: 53 mL/min — ABNORMAL LOW (ref >60)
GLUCOSE: 142 mg/dL — AB (ref 65–99)
Potassium: 3.8 mmol/L (ref 3.5–5.1)
Sodium: 135 mmol/L (ref 135–145)
Total Bilirubin: 0.6 mg/dL (ref 0.3–1.2)
Total Protein: 7.1 g/dL (ref 6.5–8.1)

## 2015-03-21 LAB — URINE MICROSCOPIC-ADD ON

## 2015-03-21 LAB — LIPASE, BLOOD: Lipase: 65 U/L — ABNORMAL HIGH (ref 22–51)

## 2015-03-21 LAB — I-STAT TROPONIN, ED: Troponin i, poc: 0.01 ng/mL (ref 0.00–0.08)

## 2015-03-21 MED ORDER — NITROFURANTOIN MONOHYD MACRO 100 MG PO CAPS: 100.0000 mg | ORAL_CAPSULE | Freq: Two times a day (BID) | ORAL | Status: DC

## 2015-03-21 MED ORDER — ONDANSETRON HCL 4 MG/2ML IJ SOLN
4.0000 mg | Freq: Once | INTRAMUSCULAR | Status: AC
  Administered 2015-03-21: 4 mg via INTRAVENOUS
  Filled 2015-03-21: qty 2

## 2015-03-21 MED ORDER — IOHEXOL 300 MG/ML  SOLN
100.0000 mL | Freq: Once | INTRAMUSCULAR | Status: AC | PRN
  Administered 2015-03-21: 100 mL via INTRAVENOUS

## 2015-03-21 MED ORDER — FENTANYL CITRATE (PF) 100 MCG/2ML IJ SOLN
50.0000 ug | Freq: Once | INTRAMUSCULAR | Status: AC
  Administered 2015-03-21: 50 ug via INTRAVENOUS
  Filled 2015-03-21: qty 2

## 2015-03-21 MED ORDER — DEXTROSE 5 % IV SOLN
1.0000 g | Freq: Once | INTRAVENOUS | Status: AC
  Administered 2015-03-21: 1 g via INTRAVENOUS
  Filled 2015-03-21: qty 10

## 2015-03-21 MED ORDER — HYDROMORPHONE HCL 1 MG/ML IJ SOLN
0.5000 mg | Freq: Once | INTRAMUSCULAR | Status: AC
  Administered 2015-03-21: 0.5 mg via INTRAVENOUS
  Filled 2015-03-21: qty 1

## 2015-03-21 MED ORDER — IOHEXOL 300 MG/ML  SOLN
25.0000 mL | Freq: Once | INTRAMUSCULAR | Status: AC | PRN
  Administered 2015-03-21: 25 mL via ORAL

## 2015-03-21 NOTE — ED Notes (Signed)
MD at bedside. 

## 2015-03-21 NOTE — ED Notes (Signed)
Pt reports onset this am of pain between shoulder blades. Pain radiates around to RUQ. Denies n/v.

## 2015-03-21 NOTE — ED Notes (Signed)
Pt transported to CT ?

## 2015-03-21 NOTE — ED Notes (Signed)
Pt transported to US

## 2015-03-21 NOTE — Discharge Instructions (Signed)

## 2015-03-21 NOTE — ED Provider Notes (Signed)
CSN: [id number]     Arrival date & time 03/21/15  1611 History   First MD Initiated Contact with Patient 03/21/15 1759     Chief Complaint  Patient presents with  . Back Pain  . Abdominal Pain     (Consider location/radiation/quality/duration/timing/severity/associated sxs/prior Treatment) HPI  Patient is a 64 year old female with a history of diabetes, hypertension, nephrolithiasis, CAD, depression presenting today for back pain and abdominal pain. Patient reports awaking this morning with a mid back pain crampy in nature with no alleviating factors. Denies any frank chest pain, palpitations, shortness of breath. Denies any recent surgeries, history of DVT/PE, hormone therapy, recent immobilization. Denies tearing sensation. Subsequently she began having right upper quadrant pain after eating as well. Pain has been continuous. Denies any radiation but aggravated by palpation of the right upper quadrant. Denies any fevers chills, nausea, vomiting, diarrhea, dysuria, flank pain.  Past Medical History  Diagnosis Date  . DM (diabetes mellitus)   . Obesity   . Dyslipidemia   . HTN (hypertension)   . Hypothyroidism   . Gastroparesis   . Migraine   . Osteoporosis   . Colon polyps 7/07    hyperplastic and adenomatous  . Nephrolithiasis   . Diverticulosis   . External hemorrhoid   . Fatty liver   . Asthma   . CAD (coronary artery disease)     a. 12/2008 Cath: mild irregs throughout, EF 75%.  . 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 Allergy   . Cataract     "just beginning"  . Myocardial infarction     FEb 2015  . Depression     pt unsure, psychologist said no  . Sleep apnea     wears CPAP  . Hyperlipidemia    Past Surgical History  Procedure Laterality Date  . Tubal ligation  '85  . Breast reduction surgery  '88  . Cervical disc surgery  '98    fusion  . Carpal tunnel release  2000    right wrist  . Rotator cuff repair  '09    left shoulder  . Ulnar nerve repair  '02,  '08    left done, then right  . Cesarean section  '79  '84  . Left heart catheterization with coronary angiogram N/A 10/26/2013    Procedure: LEFT HEART CATHETERIZATION WITH CORONARY ANGIOGRAM;  Surgeon: Sinclair Grooms, MD;  Location: [facility] CATH LAB;  Service: Cardiovascular;  Laterality: N/A;  . Colonoscopy     Family History  Problem Relation Age of Onset  . COPD Father   . Stroke Father   . Emphysema Father   . Heart failure Brother   . Heart failure Mother   . Heart attack Mother     several  . Diabetes Mother   . Kidney disease Mother   . Hypertension Brother   . Colon cancer      mat. 1st cousin  . Other      celiac sprue, 1/2 brother  . Diabetes Brother     x 3  . Lung cancer Brother   . Cirrhosis Sister     liver transplant  . Liver disease Sister     transplant  . Heart attack Paternal Grandfather   . Heart attack Brother   . Esophageal cancer Neg Hx   . Stomach cancer Neg Hx   . Rectal cancer Neg Hx    History  Substance Use Topics  . Smoking status: Never Smoker   . Smokeless tobacco:  Never Used  . Alcohol Use: No   OB History    No data available     Review of Systems  Constitutional: Negative for fever and chills.  HENT: Negative for congestion and sore throat.   Eyes: Negative for pain.  Respiratory: Negative for cough and shortness of breath.   Cardiovascular: Negative for chest pain, palpitations and leg swelling.  Gastrointestinal: Positive for abdominal pain. Negative for nausea, vomiting and diarrhea.  Genitourinary: Negative for dysuria and flank pain.  Musculoskeletal: Positive for back pain. Negative for neck pain.  Skin: Negative for rash.  Allergic/Immunologic: Negative.   Neurological: Negative for dizziness and light-headedness.  Psychiatric/Behavioral: Negative for confusion.      Allergies  Invokana; Nsaids; and Penicillins  Home Medications   Prior to Admission medications   Medication Sig Start Date End Date Taking?  Authorizing Provider  albuterol (PROVENTIL HFA;VENTOLIN HFA) 108 (90 BASE) MCG/ACT inhaler Inhale 2 puffs into the lungs every 6 (six) hours as needed. For shortness of breath.   Yes Historical Provider, MD  amLODipine (NORVASC) 5 MG tablet Take 5 mg by mouth every morning.  06/08/12  Yes Historical Provider, MD  aspirin EC 81 MG tablet Take 81 mg by mouth every morning.   Yes Historical Provider, MD  calcium-vitamin D (CALCIUM 500/D) 500-200 MG-UNIT per tablet Take 1 tablet by mouth every evening.    Yes Historical Provider, MD  carisoprodol (SOMA) 350 MG tablet Take 350 mg by mouth 4 (four) times daily.    Yes Historical Provider, MD  carvedilol (COREG) 12.5 MG tablet Take 1 tablet (12.5 mg total) by mouth 2 (two) times daily with a meal. 11/13/14  Yes Belva Crome, MD  Coenzyme Q10 (CO Q 10) 100 MG CAPS Take 1 tablet by mouth 2 (two) times daily.    Yes Historical Provider, MD  CRESTOR 20 MG tablet TAKE 1 TABLET EVERY DAY 04/24/14  Yes Belva Crome, MD  fenofibrate 160 MG tablet Take 160 mg by mouth at bedtime.    Yes Historical Provider, MD  furosemide (LASIX) 20 MG tablet Take 1 tablet (20 mg total) by mouth every morning. 11/13/14  Yes Belva Crome, MD  gabapentin (NEURONTIN) 300 MG capsule Take 300 mg by mouth 3 (three) times daily.   Yes Historical Provider, MD  HYDROcodone-acetaminophen (NORCO) 5-325 MG per tablet Take 1 tablet by mouth every 6 (six) hours as needed. For joint and back pain.   Yes Historical Provider, MD  insulin glargine (LANTUS) 100 UNIT/ML injection Inject 0.7 mLs (70 Units total) into the skin at bedtime. Patient taking differently: Inject 70 Units into the skin at bedtime. 30 units am and 40 units at bedtime. 09/11/14  Yes Philemon Kingdom, MD  insulin regular (NOVOLIN R,HUMULIN R) 100 units/mL injection Inject 30-34 units into the skin 3 times daily. Dx code; E13.9 01/25/15  Yes Philemon Kingdom, MD  levothyroxine (SYNTHROID, LEVOTHROID) 75 MCG tablet Take 75 mcg by mouth  daily before breakfast.    Yes Historical Provider, MD  losartan (COZAAR) 50 MG tablet  02/12/14  Yes Historical Provider, MD  metFORMIN (GLUCOPHAGE) 1000 MG tablet Take 1,000 mg by mouth 2 (two) times daily with a meal.   Yes Historical Provider, MD  metoCLOPramide (REGLAN) 10 MG tablet Take 10 mg by mouth 4 (four) times daily. Take every day per patient   Yes Historical Provider, MD  montelukast (SINGULAIR) 10 MG tablet Take 10 mg by mouth at bedtime.   Yes Historical Provider,  MD  nitroGLYCERIN (NITROSTAT) 0.4 MG SL tablet Place 1 tablet (0.4 mg total) under the tongue every 5 (five) minutes as needed for chest pain. 10/29/13  Yes Rhonda G Barrett, PA-C  omeprazole (PRILOSEC) 20 MG capsule Take 20 mg by mouth 2 (two) times daily before a meal.   Yes Historical Provider, MD  polyethylene glycol (MIRALAX / GLYCOLAX) packet Take 17 g by mouth daily.   Yes Historical Provider, MD  topiramate (TOPAMAX) 50 MG tablet Take 50 mg by mouth 2 (two) times daily.   Yes Historical Provider, MD  Vitamin D, Ergocalciferol, (DRISDOL) 50000 UNITS CAPS Take 50,000 Units by mouth See admin instructions. Take on Mondays and Fridays   Yes Historical Provider, MD  Insulin Syringe-Needle U-100 (INSULIN SYRINGE 1CC/31GX5/16") 31G X 5/16" 1 ML MISC Use to inject insulin 5 times daily as instructed. 03/14/15   Philemon Kingdom, MD  nitrofurantoin, macrocrystal-monohydrate, (MACROBID) 100 MG capsule Take 1 capsule (100 mg total) by mouth 2 (two) times daily. 03/21/15   Geronimo Boot, MD   BP 114/52 mmHg  Pulse 85  Temp(Src) 98.2 F (36.8 C) (Oral)  Resp 12  Ht 5\' 1"  (1.549 m)  Wt 224 lb (101.606 kg)  BMI 42.35 kg/m2  SpO2 95% Physical Exam  Constitutional: She is oriented to person, place, and time. She appears well-developed and well-nourished. No distress.  HENT:  Head: Normocephalic and atraumatic.  Eyes: Conjunctivae and EOM are normal. Pupils are equal, round, and reactive to light.  Neck: Normal range of motion.  Neck supple.  Cardiovascular: Normal rate, regular rhythm and normal heart sounds.   Pulses:      Radial pulses are 2+ on the right side, and 2+ on the left side.  Pulmonary/Chest: Effort normal and breath sounds normal. No respiratory distress.  Abdominal: Soft. Bowel sounds are normal. There is tenderness. There is positive Murphy's sign. There is no rigidity, no rebound, no CVA tenderness and no tenderness at McBurney's point.  Musculoskeletal: Normal range of motion.  Neurological: She is alert and oriented to person, place, and time. She has normal reflexes. No cranial nerve deficit.  Skin: Skin is warm and dry. She is not diaphoretic.  Psychiatric: She has a normal mood and affect.    ED Course  Procedures (including critical care time) Labs Review Labs Reviewed  LIPASE, BLOOD - Abnormal; Notable for the following:    Lipase 65 (*)    All other components within normal limits  COMPREHENSIVE METABOLIC PANEL - Abnormal; Notable for the following:    Glucose, Bld 142 (*)    Creatinine, Ser 1.10 (*)    GFR calc non Af Amer 53 (*)    All other components within normal limits  CBC - Abnormal; Notable for the following:    Hemoglobin 10.4 (*)    HCT 33.5 (*)    MCH 25.9 (*)    RDW 16.4 (*)    All other components within normal limits  URINALYSIS, ROUTINE W REFLEX MICROSCOPIC (NOT AT [facility]) - Abnormal; Notable for the following:    Nitrite POSITIVE (*)    Leukocytes, UA SMALL (*)    All other components within normal limits  URINE MICROSCOPIC-ADD ON - Abnormal; Notable for the following:    Bacteria, UA FEW (*)    All other components within normal limits  URINE CULTURE  I-STAT TROPOININ, ED    Imaging Review Dg Chest 2 View  03/21/2015   CLINICAL DATA:  Right posterior chest pain.  Epigastric pain.  EXAM: CHEST  2 VIEW  COMPARISON:  10/28/2013  FINDINGS: The heart size and mediastinal contours are within normal limits. Both lungs are clear. No evidence of pneumothorax or  pleural effusion. Cervical spine fusion hardware again noted.  IMPRESSION: Stable exam.  No active cardiopulmonary disease.   Electronically Signed   By: Earle Gell M.D.   On: 03/21/2015 17:59   US Abdomen Complete  03/21/2015   CLINICAL DATA:  Right upper quadrant pain  EXAM: ULTRASOUND ABDOMEN COMPLETE  COMPARISON:  None.  FINDINGS: The examination is limited secondary to body habitus.  Gallbladder: No gallstones or wall thickening visualized. No sonographic Murphy sign noted.  Common bile duct: Diameter: 4 mm  Liver: No focal lesion identified. Increased hepatic parenchymal echogenicity.  IVC: Limited visualization secondary to overlying bowel gas.  Pancreas: Limited visualization secondary to overlying bowel gas.  Spleen: Mild splenomegaly measuring 646.9 mL.  Right Kidney: Length: 13.2 cm. Echogenicity within normal limits. No mass or hydronephrosis visualized.  Left Kidney: Length: 12.8 cm. Echogenicity within normal limits. No mass or hydronephrosis visualized.  Abdominal aorta: Not visualized secondary to body habitus.  Other findings: None.  IMPRESSION: 1. No cholelithiasis or sonographic evidence of acute cholecystitis. 2. Mild splenomegaly.   Electronically Signed   By: Kathreen Devoid   On: 03/21/2015 20:12   Ct Abdomen Pelvis W Contrast  03/21/2015   CLINICAL DATA:  64 year old female with right upper quadrant abdominal pain  EXAM: CT ABDOMEN AND PELVIS WITH CONTRAST  TECHNIQUE: Multidetector CT imaging of the abdomen and pelvis was performed using the standard protocol following bolus administration of intravenous contrast.  CONTRAST:  154mL OMNIPAQUE IOHEXOL 300 MG/ML  SOLN  COMPARISON:  Ultrasound dated 03/21/2015 and CT dated 03/01/2012  FINDINGS: The visualized lung bases are clear no no intra-abdominal free air or free fluid.  Diffuse hepatic steatosis. The gallbladder, pancreas, spleen, adrenal glands, kidneys, visualized ureters, and urinary bladder appear unremarkable. Bilateral tubal  ligation clips noted. The uterus is grossly unremarkable.  There is sigmoid diverticulosis without active inflammation. Constipation. No bowel obstruction. Normal appendix.  There is aortoiliac atherosclerotic disease. The visualized abdominal aorta and IVC appear unremarkable. No portal venous gas identified. There is no lymphadenopathy.  There is a fat containing umbilical hernia without evidence of inflammation or incarceration. Mild degenerative changes of the spine. No acute fracture.  IMPRESSION: No acute intra-abdominal or pelvic pathology. No gallstone or inflammatory changes of the gallbladder.   Electronically Signed   By: Anner Crete M.D.   On: 03/21/2015 22:48     EKG Interpretation None      MDM   Final diagnoses:  Abdominal pain, unspecified abdominal location  UTI (lower urinary tract infection)   Patient is a 64 year old female with a history of diabetes, hypertension, nephrolithiasis, CAD, depression presenting today for back pain and abdominal pain.  On initial evaluation patient was hemodynamically stable and in no acute distress. She did have tenderness to palpation in the right upper quadrant with positive Murphy's sign. Back palpated with no tenderness. Lung sounds equal. Chest x-ray with no pneumonia or pneumothorax. No widened mediastinum with no tearing sensation. Equal radial pulses bilaterally. Doubt dissection. EKG showing no acute ischemic changes arrhythmia or arrhythmic genetic potential. Troponin negative. Doubt ACS at this time. Patient also low risk for PE with no tachycardia or hypoxia. Doubt PE. CMP with no elevated liver enzymes or bilirubin. Right upper quadrant ultrasound negative. CT abdomen and pelvis also negative for gallstones or inflammation. Urine with  positive nitrites and small leuks with bacteria. Culture sent and given Rocephin in the ED. Given antibiotics for outpatient treatment. Lipase mildly elevated. Possibly mild episode of pancreatitis at  this time. Patient advised to follow-up with her gastroenterologist as soon as possible. Also given red flag return precautions for abdominal pain.  If performed, labs, EKGs, and imaging were reviewed/interpreted by myself and my attending and incorporated into medical decision making.  Discussed pertinent finding with patient or caregiver prior to discharge with no further questions.  Immediate return precautions given and pt or caregiver reports understanding.  Pt care supervised by my attending Dr. Francena Hanly, MD PGY-2  Emergency Medicine   Geronimo Boot, MD 35/46/56 [id number]  David Glick, MD 51/70/01 [id number]

## 2015-03-23 ENCOUNTER — Encounter: Payer: Self-pay | Admitting: Internal Medicine

## 2015-03-23 LAB — URINE CULTURE

## 2015-03-23 NOTE — Progress Notes (Signed)
Patient called stating she took her Metformin after being instructed not to for 48 hours post IV contrast injection. CT supervisor instructed her to call her PCP same day as a follow-up for further instructions.Patient stated she has an appointment 03/23/15.

## 2015-03-25 ENCOUNTER — Telehealth (HOSPITAL_COMMUNITY): Payer: Self-pay

## 2015-03-25 NOTE — Telephone Encounter (Signed)
Post ED Visit - Positive Culture Follow-up  Culture report reviewed by antimicrobial stewardship pharmacist: []  Wes [place], Pharm.D., BCPS []  Heide Guile, Pharm.D., BCPS []  Alycia Rossetti, Pharm.D., BCPS []  [place], Pharm.D., BCPS, AAHIVP []  Legrand [place]o, Pharm.D., BCPS, AAHIVP []  Isac Sarna, Pharm.D., BCPS X  Salome Arnt, Pharm.D.  Positive Urine culture, >/= 100,000 colonies -> E Coli Treated with Nitrofurantoin, organism sensitive to the same and no further patient follow-up is required at this time.   Dortha Kern 03/25/2015, 5:21 AM

## 2015-03-29 ENCOUNTER — Encounter: Payer: Medicare Other | Admitting: Internal Medicine

## 2015-04-02 ENCOUNTER — Ambulatory Visit (INDEPENDENT_AMBULATORY_CARE_PROVIDER_SITE_OTHER): Payer: Medicare Other | Admitting: Internal Medicine

## 2015-04-02 ENCOUNTER — Encounter: Payer: Self-pay | Admitting: Internal Medicine

## 2015-04-02 ENCOUNTER — Ambulatory Visit: Payer: Medicare Other | Admitting: Gastroenterology

## 2015-04-02 VITALS — BP 122/76 | HR 84 | Temp 98.8°F | Resp 16 | Ht 61.0 in | Wt 224.0 lb

## 2015-04-02 DIAGNOSIS — I255 Ischemic cardiomyopathy: Secondary | ICD-10-CM | POA: Diagnosis not present

## 2015-04-02 DIAGNOSIS — E139 Other specified diabetes mellitus without complications: Secondary | ICD-10-CM

## 2015-04-02 MED ORDER — INSULIN GLARGINE 100 UNIT/ML ~~LOC~~ SOLN: SUBCUTANEOUS | Status: DC

## 2015-04-02 MED ORDER — INSULIN REGULAR HUMAN 100 UNIT/ML IJ SOLN
INTRAMUSCULAR | Status: DC
Start: 2015-04-02 — End: 2015-05-03

## 2015-04-02 NOTE — Patient Instructions (Signed)
Please increase Lantus to 40 units in am and 40 units at bedtime.   Continue HumulinR U100: - 40 units (small meal)  - 44 units (larger meal)  For dinner, use 48-50 units.  For a low carb meal, use 30-34 units.  Continue sliding scale Humulin insulin: - 150- 165: + 1 unit  - 166- 180: + 2 units  - 181- 195: + 3 units  - 196- 210: + 4 units  - >210: + 5 units   Please stop at the lab.  Please come back for a follow-up appointment in 3 months

## 2015-04-02 NOTE — Progress Notes (Signed)
Patient ID: Danielle Vargas, female   DOB: 1951/03/13, 64 y.o.   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 Last visit 3 mo ago. She has [name].  Since last visit, she developed pancreatitis (03/21/2015). Since then she is on a diet.   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  Previous regimen: - Metformin 1000 mg bid  - U500 0.11-0.11-0.10 units before meals >> 0.09 mL tid  She has been on Glipizide in the past, too. She tried Invokana in the past >> severe diarrhea  She is now on: - Metformin 1000 mg bid  - Lantus 60 >> 70 units at bedtime (30 in am and 40 at night) (prefers a vial). - Humulin R U100: - 22 >> 24 >> 30 >> 40 units (small meal)  - 26  >> 28 >> 34 >> 44 units (larger meal)  Sliding scale Humulin insulin to the mealtime insulin: - 150- 165: + 1 unit  - 166- 180: + 2 units  - 181- 195: + 3 units  - 196- 210: + 4 units  - >210: + 5 units   Pt checks her sugars 3-4x a day - brings meter >> downloaded: - am: 84-209 >> 91, 138-215, 250 >> 116-228 >> 116, 149-201, 247 >> 133-233, 318 - 2h after b'fast: 129-228 >> 136, 194-301 >> 134-246 >> 152-266 >> n/c - before lunch: 74-227 >> 151-269, 288 >> 104, 109-203, 244 >> 133-226 >> 149-302 - 2h after lunch: 165-209, 264 >> 93-223, 251 >> 188-222 >> 90-308 - before dinner: 137, 142-229, 247 >> 146-239 >> 201-252 >> 71-246 - 2h after dinner: 174-267 >> 193-260, 322 >> 108-202, 266 >> 128-243 >> 184-235 - bedtime:118, 163-275 >> 111-242, 300 >> 147-223 >> 133-300 - nighttime: 161-310 >> n/c >> 75-232 >> 136-291 >> n/c few  lows - lowest 71; she has hypoglycemia awareness at 95.  Highest sugar was 300s.  Pt's meals are: - Breakfast: 2 slices of bread + PB - Lunch: sandwich;  salad + cheese + croutons;  baked potato >> pinto beans + cornbread - Dinner: leftovers or corn bread + pinto beans + occas. Cole slaw >> salad + lite dressing + saltines Not soft drinks anymore. Snacks at night  - has mild CKD, last BUN/creatinine:  Lab Results  Component Value Date   BUN 14 03/21/2015   CREATININE 1.10* 03/21/2015  She is back on Losartan. - pt has HL:  02/2015: TG 252 (PCP)  Lab Results  Component Value Date   CHOL 115 10/27/2013   HDL 21* 10/27/2013   LDLCALC 47 10/27/2013   TRIG 236* 10/27/2013   CHOLHDL 5.5 10/27/2013  She is on Fenofibrate. - last eye exam was in 01/2014 (unchanged) - Dr Zigmund Daniel. R>L DR.  - + numbness and tingling in her feet. On neurontin.  She also has a history of HTN, HL, fatty liver, GERD, morbid obesity, hypothyroidism, migraines, chronic back pain, history of nephrolithiasis, osteoporosis, diverticulosis. She was recently dx'ed with OSA >> on CPAP.   I reviewed pt's medications, allergies, PMH, social hx, family hx, and changes were documented in the history of present illness. Otherwise, unchanged from my initial visit  note.  ROS: Constitutional: no weight gain/loss, no fatigue, no subjective hyperthermia/hypothermia Eyes: no blurry vision, no xerophthalmia ENT: no sore throat, no nodules palpated in throat, no dysphagia/odynophagia, no hoarseness Cardiovascular: no CP/SOB/palpitations/leg swelling Respiratory: no cough/SOB/wheezing Gastrointestinal: no N/V/D/C Musculoskeletal: no muscle/joint aches Skin: no rashes Neurological: no tremors/numbness/tingling/dizziness  PE: BP 122/76 mmHg  Pulse 84  Temp(Src) 98.8 F (37.1 C) (Oral)  Resp 16  Ht 5\' 1"  (1.549 m)  Wt 224 lb (101.606 kg)  BMI 42.35 kg/m2  SpO2 95% Wt Readings from Last 3 Encounters:  04/02/15 224 lb  (101.606 kg)  03/21/15 224 lb (101.606 kg)  01/18/15 227 lb 12.8 oz (103.329 kg)   Constitutional: overweight, in NAD Eyes: PERRLA, EOMI, no exophthalmos ENT: moist mucous membranes, no thyromegaly, no cervical lymphadenopathy Cardiovascular: RRR, No MRG Respiratory: CTA B Gastrointestinal: abdomen soft, NT, ND, BS+ Musculoskeletal: no deformities, strength intact in all 4 Skin: moist, warm, no rashes  ASSESSMENT: 1. DM2, insulin-dependent, uncontrolled, with complications - CAD - AMI 10/26/2013 - had angioplasty, no stent placed. Had 2DEcho on 01/23/2014 EF increased (from 35) to 55%. - peripheral neuropathy, on Neurontin - Gastroparesis, on Reglan - Dr. 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 Patient with long-standing diabetes - sugars still above goal at most times of the day, but few low CBGs after bolusing and not eating many carbs. Will stay with Lantus as Toujeo not covered. Will increase her mealtime and basal insulin. We need to restart U500. She has insulin U100 at home, would like to run out of this first. She will enter the donut hole soon >> will hopefully qualify for the [place].   -  I suggested:  Patient Instructions  Please increase Lantus to 40 units in am and 40 units at bedtime.   Continue Humulin R U100, but increase to: - 40 units (small meal)  - 44 units (larger meal)  For dinner, use 48-50 units.  For a low carb meal, use 30-34 units.  Continue sliding scale Humulin insulin: - 150- 165: + 1 unit  - 166- 180: + 2 units  - 181- 195: + 3 units  - 196- 210: + 4 units  - >210: + 5 units   Please stop at the lab.  Please come back for a follow-up appointment in 3 months  - continue checking sugars at different times of the day and write them down - she is doing a great job with this - will check fructosamine today - Return to clinic in 3 mo with sugar log   Office Visit on 04/02/2015  Component Date Value Ref Range Status  . Fructosamine  04/02/2015 266  0 - 285 umol/L Final   Comment: Published reference interval for apparently healthy subjects between age 70 and 78 is 18 - 285 umol/L and in a poorly controlled diabetic population is 228 - 563 umol/L with a mean of 396 umol/L.   Marland Kitchen specimen status report 04/02/2015 Comment   Final   Comment: Written Authorization Written Authorization Written Authorization Received. Authorization received from [employer] 04-03-2015 Logged by Cordie Grice Mabe   Calculated HbA1c: 6.13%!

## 2015-04-03 NOTE — Progress Notes (Signed)
Cardiology Office Note   Date:  04/04/2015   ID:  Danielle Vargas, DOB [birth year], MRN [id number]  PCP:  Marton Redwood, MD  Cardiologist:  Dr. Daneen Schick   Electrophysiologist:  n/a  Chief Complaint  Patient presents with  . Coronary Artery Disease    hx of SCAD     History of Present Illness: Danielle Vargas is a 64 y.o. female with a hx of CAD, ischemic cardiomyopathy, OSA, HTN, HL. 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 This was treated with angioplasty. EF was 35-40% at the time. Follow-up echo 5/15 demonstrated improved LV function with an EF of 55%. Last seen by Dr. Tamala Julian 10/2014. Plavix was discontinued. Beta blocker dose was reduced secondary to low blood pressure.   She returns for FU.  She is here alone. She is overall doing well. Unfortunately, she was recently diagnosed with pancreatitis. She is slowly improving. She denies chest discomfort. She does have dyspnea with exertion. This is chronic and stable. She is NYHA 2-2b. She denies orthopnea, PND or edema. She denies syncope. She does have mild pedal edema without change. She does feel fatigued and dizzy at times.   Studies/Reports Reviewed Today:  Echo 01/23/14 - Moderate LVH. EF55%. - Mitral valve: No color flow done - Pericardium, extracardiac: Small pericardial effusion mostlyposterior to the heart  LHC 10/26/13 ANGIOGRAPHIC DATA:  LM: short but widely patent . LAD:  prox 30-40%  LCx:   The first obtuse marginal is totally occluded in its distal half. The remainder of the circumflex is widely patent . RCA:  without any significant obstruction. PCI RESULTS: Angioplasty of the distal OM 1 segment reduced C1 100% stenosis to 0% with TIMI grade 3 flow. A small branch of this artery remained occluded and was seen to fill by contrast staining but did not have good antegrade flow to  LEFT  VENTRICULOGRAM: Left ventricular angiogram was done in the 30 RAO projection and revealed apical severe hypokinesis/akinesis with an estimated ejection fraction of 45-50%  IMPRESSIONS: 1. Acute lateral wall ST elevation myocardial infarction do to acute occlusion of the distal half of the first obtuse marginal. This could have been related to spontaneous coronary artery dissection.  2. Successful angioplasty of the first obtuse marginal with reestablishment of flow to the great majority of the distal bed in question. 3. Widely patent circumflex, RCA, and LAD. The LAD contains proximal calcification and less than 50% stenosis. 4. Left ventricular dysfunction with severe inferoapical hypokinesis   Past Medical History  Diagnosis Date  . DM (diabetes mellitus)   . Obesity   . Dyslipidemia   . HTN (hypertension)   . Hypothyroidism   . Gastroparesis   . Migraine   . Osteoporosis   . Colon polyps 7/07    hyperplastic and adenomatous  . Nephrolithiasis   . Diverticulosis   . External hemorrhoid   . Fatty liver   . Asthma   . CAD (coronary artery disease)     a. 12/2008 Cath: mild irregs throughout, EF 75%.  . 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 Allergy   . Cataract     "just beginning"  . Myocardial infarction     FEb 2015  . Depression     pt unsure, psychologist said no  . Sleep apnea     wears CPAP  . Hyperlipidemia     Past  Surgical History  Procedure Laterality Date  . Tubal ligation  '85  . Breast reduction surgery  '88  . Cervical disc surgery  '98    fusion  . Carpal tunnel release  2000    right wrist  . Rotator cuff repair  '09    left shoulder  . Ulnar nerve repair  '02, '08    left done, then right  . Cesarean section  '79  '84  . Left heart catheterization with coronary angiogram N/A 10/26/2013    Procedure: LEFT HEART CATHETERIZATION WITH CORONARY ANGIOGRAM;  Surgeon: Sinclair Grooms, MD;  Location: [facility] CATH LAB;  Service: Cardiovascular;   Laterality: N/A;  . Colonoscopy       Current Outpatient Prescriptions  Medication Sig Dispense Refill  . albuterol (PROVENTIL HFA;VENTOLIN HFA) 108 (90 BASE) MCG/ACT inhaler Inhale 2 puffs into the lungs every 6 (six) hours as needed. For shortness of breath.    Marland Kitchen aspirin EC 81 MG tablet Take 81 mg by mouth every morning.    Marland Kitchen BAYER MICROLET LANCETS lancets 1 each by Other route 5 (five) times daily. Use as instructed    . calcium-vitamin D (CALCIUM 500/D) 500-200 MG-UNIT per tablet Take 1 tablet by mouth every evening.     . carisoprodol (SOMA) 350 MG tablet Take 350 mg by mouth 4 (four) times daily.     . carvedilol (COREG) 12.5 MG tablet Take 1 tablet (12.5 mg total) by mouth 2 (two) times daily with a meal. 180 tablet 3  . Coenzyme Q10 (CO Q 10) 100 MG CAPS Take 1 tablet by mouth 2 (two) times daily.     . CRESTOR 20 MG tablet TAKE 1 TABLET EVERY DAY 90 tablet 1  . fenofibrate 160 MG tablet Take 160 mg by mouth at bedtime.     . furosemide (LASIX) 20 MG tablet Take 1 tablet (20 mg total) by mouth every morning. 90 tablet 3  . gabapentin (NEURONTIN) 300 MG capsule Take 300 mg by mouth 3 (three) times daily.    Marland Kitchen glucose blood (BAYER CONTOUR TEST) test strip 1 each by Other route 5 (five) times daily. Use as instructed    . HYDROcodone-acetaminophen (NORCO) 5-325 MG per tablet Take 1 tablet by mouth every 6 (six) hours as needed. For joint and back pain.    Marland Kitchen insulin glargine (LANTUS) 100 UNIT/ML injection Inject under skin 40 units am and 40 units at bedtime. 15 mL 0  . insulin regular (NOVOLIN R,HUMULIN R) 100 units/mL injection Inject 30-50 units into the skin 3 times daily. Dx code; E13.9 30 mL 2  . Insulin Syringe-Needle U-100 (INSULIN SYRINGE 1CC/31GX5/16") 31G X 5/16" 1 ML MISC Use to inject insulin 5 times daily as instructed. 450 each 3  . levothyroxine (SYNTHROID, LEVOTHROID) 75 MCG tablet Take 75 mcg by mouth daily before breakfast.     . losartan (COZAAR) 50 MG tablet     .  metFORMIN (GLUCOPHAGE) 1000 MG tablet Take 1,000 mg by mouth 2 (two) times daily with a meal.    . metoCLOPramide (REGLAN) 10 MG tablet Take 10 mg by mouth 4 (four) times daily. Take every day per patient    . montelukast (SINGULAIR) 10 MG tablet Take 10 mg by mouth at bedtime.    . nitroGLYCERIN (NITROSTAT) 0.4 MG SL tablet Place 1 tablet (0.4 mg total) under the tongue every 5 (five) minutes as needed for chest pain. 25 tablet 3  . omeprazole (PRILOSEC) 20 MG capsule  Take 20 mg by mouth 2 (two) times daily before a meal.    . polyethylene glycol (MIRALAX / GLYCOLAX) packet Take 17 g by mouth daily.    Marland Kitchen topiramate (TOPAMAX) 50 MG tablet Take 50 mg by mouth 2 (two) times daily.    . Vitamin D, Ergocalciferol, (DRISDOL) 50000 UNITS CAPS Take 50,000 Units by mouth See admin instructions. Take on Mondays and Fridays     No current facility-administered medications for this visit.    Allergies:   Invokana; Nsaids; and Penicillins    Social History:  The patient  reports that she has never smoked. She has never used smokeless tobacco. She reports that she does not drink alcohol or use illicit drugs.   Family History:  The patient's family history includes COPD in her father; Cirrhosis in her sister; Colon cancer in an other family member; Diabetes in her brother and mother; Emphysema in her father; Heart attack in her brother, mother, and paternal grandfather; Heart failure in her brother and mother; Hypertension in her brother; Kidney disease in her mother; Liver disease in her sister; Lung cancer in her brother; Other in an other family member; Stroke in her father. There is no history of Esophageal cancer, Stomach cancer, or Rectal cancer.    ROS:   Please see the history of present illness.   Review of Systems  Respiratory: Positive for snoring.   Musculoskeletal: Positive for back pain and myalgias.  Gastrointestinal: Positive for abdominal pain.  All other systems reviewed and are  negative.     PHYSICAL EXAM: VS:  BP 102/50 mmHg  Pulse 88  Ht 5\' 1"  (1.549 m)  Wt 225 lb 6.4 oz (102.241 kg)  BMI 42.61 kg/m2  SpO2 97%    Wt Readings from Last 3 Encounters:  04/04/15 225 lb 6.4 oz (102.241 kg)  04/02/15 224 lb (101.606 kg)  03/21/15 224 lb (101.606 kg)     GEN: Well nourished, well developed, in no acute distress HEENT: normal Neck: no JVD,  no masses Cardiac:  Normal S1/S2, RRR; no murmur ,  no rubs or gallops, trace bilateral LE edema   Respiratory:  clear to auscultation bilaterally, no wheezing, rhonchi or rales. GI: soft, nontender, nondistended, + BS MS: no deformity or atrophy Skin: warm and dry  Neuro:  CNs II-XII intact, Strength and sensation are intact Psych: Normal affect   EKG:  EKG is not ordered today.  It demonstrates:   n/a   Recent Labs: 03/21/2015: ALT 21; BUN 14; Creatinine, Ser 1.10*; Hemoglobin 10.4*; Platelets 306; Potassium 3.8; Sodium 135    Lipid Panel    Component Value Date/Time   CHOL 115 10/27/2013 0307   TRIG 236* 10/27/2013 0307   HDL 21* 10/27/2013 0307   CHOLHDL 5.5 10/27/2013 0307   VLDL 47* 10/27/2013 0307   LDLCALC 47 10/27/2013 0307      ASSESSMENT AND PLAN:  Coronary artery disease involving native coronary artery of native heart without angina pectoris:  No angina. Continue aspirin, beta blocker, ARB, statin.  Ischemic cardiomyopathy:  EF improved to normal by last echocardiogram. Volume appears stable. Continue beta blocker, ARB.  Essential hypertension:  Blood pressure is running low. She seems to be symptomatic. Given her history of coronary artery disease, prior MI and ischemic cardiomyopathy as well as diabetes, she should remain on beta blocker and ARB. I will stop her amlodipine. I have asked her to keep an eye on her blood pressure over the next several weeks. I will  see her back in 6 weeks to review her blood pressures and decide on further adjustments if necessary.  Hyperlipidemia:  Continue  statin. Follow-up primary care.  OSA on CPAP: Continue CPAP.     Medication Changes: Current medicines are reviewed at length with the patient today.  Concerns regarding medicines are as outlined above.  The following changes have been made:   Discontinued Medications   AMLODIPINE (NORVASC) 5 MG TABLET    Take 5 mg by mouth every morning.    AMOXICILLIN (AMOXIL) 500 MG CAPSULE    Take 500 mg by mouth daily.   B-D UF III MINI PEN NEEDLES 31G X 5 MM MISC       NITROFURANTOIN, MACROCRYSTAL-MONOHYDRATE, (MACROBID) 100 MG CAPSULE    Take 1 capsule (100 mg total) by mouth 2 (two) times daily.   Modified Medications   No medications on file   New Prescriptions   No medications on file    Labs/ tests ordered today include:   Orders Placed This Encounter  Procedures  . EKG 12-Lead     Disposition:   FU with me 6 weeks.    Signed, Versie Starks, MHS 04/04/2015 12:31 PM    [facility] [address], [place], [place]  [address] Phone: [phone]; Fax: [phone]

## 2015-04-04 ENCOUNTER — Encounter: Payer: Self-pay | Admitting: Physician Assistant

## 2015-04-04 ENCOUNTER — Ambulatory Visit (INDEPENDENT_AMBULATORY_CARE_PROVIDER_SITE_OTHER): Payer: Medicare Other | Admitting: Physician Assistant

## 2015-04-04 VITALS — BP 102/50 | HR 88 | Ht 61.0 in | Wt 225.4 lb

## 2015-04-04 DIAGNOSIS — I255 Ischemic cardiomyopathy: Secondary | ICD-10-CM

## 2015-04-04 DIAGNOSIS — I2510 Atherosclerotic heart disease of native coronary artery without angina pectoris: Secondary | ICD-10-CM

## 2015-04-04 DIAGNOSIS — G4733 Obstructive sleep apnea (adult) (pediatric): Secondary | ICD-10-CM

## 2015-04-04 DIAGNOSIS — I10 Essential (primary) hypertension: Secondary | ICD-10-CM

## 2015-04-04 DIAGNOSIS — E785 Hyperlipidemia, unspecified: Secondary | ICD-10-CM | POA: Diagnosis not present

## 2015-04-04 DIAGNOSIS — Z9989 Dependence on other enabling machines and devices: Secondary | ICD-10-CM

## 2015-04-04 LAB — SPECIMEN STATUS REPORT

## 2015-04-04 LAB — FRUCTOSAMINE: Fructosamine: 266 umol/L (ref 0–285)

## 2015-04-04 NOTE — Patient Instructions (Signed)
Medication Instructions:  Stop taking Norvasc (Amlodipine).  Labwork: None   Testing/Procedures: None   Follow-Up: Schedule with Richardson Dopp, PA-C in 6 weeks.  Any Other Special Instructions Will Be Listed Below (If Applicable).  Check BP once a day or every other day (whatever is easier for you). If your BP is 140/90 or higher every time you check it, call. Record your BP and bring your list of readings in to your next appointment.

## 2015-04-07 ENCOUNTER — Emergency Department (HOSPITAL_COMMUNITY)
Admission: EM | Admit: 2015-04-07 | Discharge: 2015-04-07 | Disposition: A | Discharge status: Home / Self Care | Payer: Medicare Other | Attending: Physician Assistant | Admitting: Physician Assistant

## 2015-04-07 ENCOUNTER — Encounter (HOSPITAL_COMMUNITY): Payer: Self-pay | Admitting: *Deleted

## 2015-04-07 DIAGNOSIS — E039 Hypothyroidism, unspecified: Secondary | ICD-10-CM | POA: Insufficient documentation

## 2015-04-07 DIAGNOSIS — J45909 Unspecified asthma, uncomplicated: Secondary | ICD-10-CM | POA: Insufficient documentation

## 2015-04-07 DIAGNOSIS — F329 Major depressive disorder, single episode, unspecified: Secondary | ICD-10-CM | POA: Insufficient documentation

## 2015-04-07 DIAGNOSIS — I10 Essential (primary) hypertension: Secondary | ICD-10-CM | POA: Insufficient documentation

## 2015-04-07 DIAGNOSIS — E669 Obesity, unspecified: Secondary | ICD-10-CM | POA: Insufficient documentation

## 2015-04-07 DIAGNOSIS — I2510 Atherosclerotic heart disease of native coronary artery without angina pectoris: Secondary | ICD-10-CM | POA: Insufficient documentation

## 2015-04-07 DIAGNOSIS — E785 Hyperlipidemia, unspecified: Secondary | ICD-10-CM | POA: Diagnosis not present

## 2015-04-07 DIAGNOSIS — Z86010 Personal history of colonic polyps: Secondary | ICD-10-CM | POA: Diagnosis not present

## 2015-04-07 DIAGNOSIS — I252 Old myocardial infarction: Secondary | ICD-10-CM | POA: Insufficient documentation

## 2015-04-07 DIAGNOSIS — Z794 Long term (current) use of insulin: Secondary | ICD-10-CM | POA: Insufficient documentation

## 2015-04-07 DIAGNOSIS — Z79899 Other long term (current) drug therapy: Secondary | ICD-10-CM | POA: Diagnosis not present

## 2015-04-07 DIAGNOSIS — R3915 Urgency of urination: Secondary | ICD-10-CM | POA: Diagnosis present

## 2015-04-07 DIAGNOSIS — Z9889 Other specified postprocedural states: Secondary | ICD-10-CM | POA: Insufficient documentation

## 2015-04-07 DIAGNOSIS — M810 Age-related osteoporosis without current pathological fracture: Secondary | ICD-10-CM | POA: Insufficient documentation

## 2015-04-07 DIAGNOSIS — N390 Urinary tract infection, site not specified: Secondary | ICD-10-CM | POA: Insufficient documentation

## 2015-04-07 DIAGNOSIS — Z8719 Personal history of other diseases of the digestive system: Secondary | ICD-10-CM | POA: Insufficient documentation

## 2015-04-07 DIAGNOSIS — Z87442 Personal history of urinary calculi: Secondary | ICD-10-CM | POA: Diagnosis not present

## 2015-04-07 DIAGNOSIS — E119 Type 2 diabetes mellitus without complications: Secondary | ICD-10-CM | POA: Diagnosis not present

## 2015-04-07 DIAGNOSIS — Z7982 Long term (current) use of aspirin: Secondary | ICD-10-CM | POA: Diagnosis not present

## 2015-04-07 LAB — URINE MICROSCOPIC-ADD ON

## 2015-04-07 LAB — URINALYSIS, ROUTINE W REFLEX MICROSCOPIC
Bilirubin Urine: NEGATIVE
GLUCOSE, UA: 250 mg/dL — AB
Ketones, ur: NEGATIVE mg/dL
Nitrite: NEGATIVE
Protein, ur: NEGATIVE mg/dL
Specific Gravity, Urine: 1.007 (ref 1.005–1.030)
Urobilinogen, UA: 0.2 mg/dL (ref 0.0–1.0)
pH: 7.5 (ref 5.0–8.0)

## 2015-04-07 MED ORDER — PHENAZOPYRIDINE HCL 95 MG PO TABS: 95.0000 mg | ORAL_TABLET | Freq: Three times a day (TID) | ORAL | Status: DC | PRN

## 2015-04-07 MED ORDER — CIPROFLOXACIN HCL 500 MG PO TABS: 500.0000 mg | ORAL_TABLET | Freq: Two times a day (BID) | ORAL | Status: DC

## 2015-04-07 MED ORDER — CIPROFLOXACIN HCL 500 MG PO TABS
500.0000 mg | ORAL_TABLET | Freq: Once | ORAL | Status: AC
  Administered 2015-04-07: 500 mg via ORAL
  Filled 2015-04-07: qty 1

## 2015-04-07 NOTE — ED Notes (Signed)
Awake. Verbally responsive. A/O x4. Resp even and unlabored. No audible adventitious breath sounds noted. ABC's intact.  

## 2015-04-07 NOTE — ED Notes (Signed)
Pt reports urinary urgency and frequency, dysuria starting this am. Recently treated for asymptomatic UTI, completed abx.

## 2015-04-07 NOTE — Discharge Instructions (Signed)
You were found to have a UTI. It is important to take all of your antibiotics as prescribed. You may use your Pyridium as needed for the burning sensation. Please follow-up with primary care for further evaluation and management of your symptoms. Return to ED for worsening symptoms.  Urinary Tract Infection Urinary tract infections (UTIs) can develop anywhere along your urinary tract. Your urinary tract is your body's drainage system for removing wastes and extra water. Your urinary tract includes two kidneys, two ureters, a bladder, and a urethra. Your kidneys are a pair of bean-shaped organs. Each kidney is about the size of your fist. They are located below your ribs, one on each side of your spine. CAUSES Infections are caused by microbes, which are microscopic organisms, including fungi, viruses, and bacteria. These organisms are so small that they can only be seen through a microscope. Bacteria are the microbes that most commonly cause UTIs. SYMPTOMS  Symptoms of UTIs may vary by age and gender of the patient and by the location of the infection. Symptoms in young women typically include a frequent and intense urge to urinate and a painful, burning feeling in the bladder or urethra during urination. Older women and men are more likely to be tired, shaky, and weak and have muscle aches and abdominal pain. A fever may mean the infection is in your kidneys. Other symptoms of a kidney infection include pain in your back or sides below the ribs, nausea, and vomiting. DIAGNOSIS To diagnose a UTI, your caregiver will ask you about your symptoms. Your caregiver also will ask to provide a urine sample. The urine sample will be tested for bacteria and white blood cells. White blood cells are made by your body to help fight infection. TREATMENT  Typically, UTIs can be treated with medication. Because most UTIs are caused by a bacterial infection, they usually can be treated with the use of antibiotics. The  choice of antibiotic and length of treatment depend on your symptoms and the type of bacteria causing your infection. HOME CARE INSTRUCTIONS  If you were prescribed antibiotics, take them exactly as your caregiver instructs you. Finish the medication even if you feel better after you have only taken some of the medication.  Drink enough water and fluids to keep your urine clear or pale yellow.  Avoid caffeine, tea, and carbonated beverages. They tend to irritate your bladder.  Empty your bladder often. Avoid holding urine for long periods of time.  Empty your bladder before and after sexual intercourse.  After a bowel movement, women should cleanse from front to back. Use each tissue only once. SEEK MEDICAL CARE IF:   You have back pain.  You develop a fever.  Your symptoms do not begin to resolve within 3 days. SEEK IMMEDIATE MEDICAL CARE IF:   You have severe back pain or lower abdominal pain.  You develop chills.  You have nausea or vomiting.  You have continued burning or discomfort with urination. MAKE SURE YOU:   Understand these instructions.  Will watch your condition.  Will get help right away if you are not doing well or get worse. Document Released: 06/04/2005 Document Revised: 02/24/2012 Document Reviewed: 10/03/2011 [facility] Patient Information 2015 [place], [place]. This information is not intended to replace advice given to you by your health care provider. Make sure you discuss any questions you have with your health care provider.

## 2015-04-07 NOTE — ED Provider Notes (Signed)
Medical screening examination/treatment/procedure(s) were conducted as a shared visit with non-physician practitioner(s) and myself.  I personally evaluated the patient during the encounter.  I saw patient with APP and agree with the assessment.   Patient is presenting with symptoms of urinary tract infection. Including burning and urgency. Patient recently treated with Keflex for urinary tract infection that was asymptomatic. We'll get urine likely will need to treat for infection. We'll send culture.  On exam patient has soft abdomen.  Dontay Harm Julio Alm, MD 04/07/15 1006

## 2015-04-07 NOTE — ED Provider Notes (Signed)
CSN: [id number]     Arrival date & time 04/07/15  7564 History   First MD Initiated Contact with Patient 04/07/15 [phone]     Chief Complaint  Patient presents with  . Urinary Frequency  . Urinary Urgency     (Consider location/radiation/quality/duration/timing/severity/associated sxs/prior Treatment) HPI Danielle Vargas is a 64 y.o. female with multiple medical problems including hypertension, CAD, diabetes, comes in for evaluation of possible UTI. Patient states she was seen in the ED on 7/13 and was diagnosed with a mild pancreatitis as well as mild UTI. She reports being asymptomatic at the time, but last night she began to have urinary frequency and dysuria. She denies any hematuria, overt abdominal pain, fevers, chills, nausea, vomiting, new back pains, vaginal bleeding or discharge, pelvic pain. No other aggravating or modifying factors.  Past Medical History  Diagnosis Date  . DM (diabetes mellitus)   . Obesity   . Dyslipidemia   . HTN (hypertension)   . Hypothyroidism   . Gastroparesis   . Migraine   . Osteoporosis   . Colon polyps 7/07    hyperplastic and adenomatous  . Nephrolithiasis   . Diverticulosis   . External hemorrhoid   . Fatty liver   . Asthma   . CAD (coronary artery disease)     a. 12/2008 Cath: mild irregs throughout, EF 75%.  . 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 Allergy   . Cataract     "just beginning"  . Myocardial infarction     FEb 2015  . Depression     pt unsure, psychologist said no  . Sleep apnea     wears CPAP  . Hyperlipidemia    Past Surgical History  Procedure Laterality Date  . Tubal ligation  '85  . Breast reduction surgery  '88  . Cervical disc surgery  '98    fusion  . Carpal tunnel release  2000    right wrist  . Rotator cuff repair  '09    left shoulder  . Ulnar nerve repair  '02, '08    left done, then right  . Cesarean section  '79  '84  . Left heart catheterization with coronary angiogram N/A 10/26/2013   Procedure: LEFT HEART CATHETERIZATION WITH CORONARY ANGIOGRAM;  Surgeon: Sinclair Grooms, MD;  Location: [facility] CATH LAB;  Service: Cardiovascular;  Laterality: N/A;  . Colonoscopy     Family History  Problem Relation Age of Onset  . COPD Father   . Stroke Father   . Emphysema Father   . Heart failure Brother   . Heart failure Mother   . Heart attack Mother     several  . Diabetes Mother   . Kidney disease Mother   . Hypertension Brother   . Colon cancer      mat. 1st cousin  . Other      celiac sprue, 1/2 brother  . Diabetes Brother     x 3  . Lung cancer Brother   . Cirrhosis Sister     liver transplant  . Liver disease Sister     transplant  . Heart attack Paternal Grandfather   . Heart attack Brother   . Esophageal cancer Neg Hx   . Stomach cancer Neg Hx   . Rectal cancer Neg Hx    History  Substance Use Topics  . Smoking status: Never Smoker   . Smokeless tobacco: Never Used  . Alcohol Use: No   OB History    No  data available     Review of Systems A 10 point review of systems was completed and was negative except for pertinent positives and negatives as mentioned in the history of present illness     Allergies  Invokana; Nsaids; and Penicillins  Home Medications   Prior to Admission medications   Medication Sig Start Date End Date Taking? Authorizing Provider  albuterol (PROVENTIL HFA;VENTOLIN HFA) 108 (90 BASE) MCG/ACT inhaler Inhale 2 puffs into the lungs every 6 (six) hours as needed. For shortness of breath.   Yes Historical Provider, MD  aspirin EC 81 MG tablet Take 81 mg by mouth every morning.   Yes Historical Provider, MD  calcium-vitamin D (CALCIUM 500/D) 500-200 MG-UNIT per tablet Take 1 tablet by mouth every evening.    Yes Historical Provider, MD  carisoprodol (SOMA) 350 MG tablet Take 350 mg by mouth 4 (four) times daily.    Yes Historical Provider, MD  carvedilol (COREG) 12.5 MG tablet Take 1 tablet (12.5 mg total) by mouth 2 (two) times  daily with a meal. 11/13/14  Yes Belva Crome, MD  Coenzyme Q10 (CO Q 10) 100 MG CAPS Take 1 tablet by mouth 2 (two) times daily.    Yes Historical Provider, MD  CRESTOR 20 MG tablet TAKE 1 TABLET EVERY DAY 04/24/14  Yes Belva Crome, MD  fenofibrate 160 MG tablet Take 160 mg by mouth at bedtime.    Yes Historical Provider, MD  furosemide (LASIX) 20 MG tablet Take 1 tablet (20 mg total) by mouth every morning. 11/13/14  Yes Belva Crome, MD  gabapentin (NEURONTIN) 300 MG capsule Take 300 mg by mouth 3 (three) times daily.   Yes Historical Provider, MD  HYDROcodone-acetaminophen (NORCO) 5-325 MG per tablet Take 1 tablet by mouth every 6 (six) hours as needed. For joint and back pain.   Yes Historical Provider, MD  insulin glargine (LANTUS) 100 UNIT/ML injection Inject under skin 40 units am and 40 units at bedtime. 04/02/15  Yes Philemon Kingdom, MD  insulin regular (NOVOLIN R,HUMULIN R) 100 units/mL injection Inject 30-50 units into the skin 3 times daily. Dx code; E13.9 04/02/15  Yes Philemon Kingdom, MD  levothyroxine (SYNTHROID, LEVOTHROID) 75 MCG tablet Take 75 mcg by mouth daily before breakfast.    Yes Historical Provider, MD  metFORMIN (GLUCOPHAGE) 1000 MG tablet Take 1,000 mg by mouth 2 (two) times daily with a meal.   Yes Historical Provider, MD  metoCLOPramide (REGLAN) 10 MG tablet Take 10 mg by mouth 4 (four) times daily. Take every day per patient   Yes Historical Provider, MD  montelukast (SINGULAIR) 10 MG tablet Take 10 mg by mouth at bedtime.   Yes Historical Provider, MD  nitroGLYCERIN (NITROSTAT) 0.4 MG SL tablet Place 1 tablet (0.4 mg total) under the tongue every 5 (five) minutes as needed for chest pain. 10/29/13  Yes Rhonda G Barrett, PA-C  omeprazole (PRILOSEC) 20 MG capsule Take 20 mg by mouth 2 (two) times daily before a meal.   Yes Historical Provider, MD  polyethylene glycol (MIRALAX / GLYCOLAX) packet Take 17 g by mouth daily as needed for mild constipation.    Yes Historical  Provider, MD  topiramate (TOPAMAX) 50 MG tablet Take 50 mg by mouth 2 (two) times daily.   Yes Historical Provider, MD  ciprofloxacin (CIPRO) 500 MG tablet Take 1 tablet (500 mg total) by mouth 2 (two) times daily. 04/07/15   Comer Locket, PA-C  phenazopyridine (PYRIDIUM) 95 MG tablet Take 1  tablet (95 mg total) by mouth 3 (three) times daily as needed for pain. 04/07/15   Comer Locket, PA-C  Vitamin D, Ergocalciferol, (DRISDOL) 50000 UNITS CAPS Take 50,000 Units by mouth every 7 (seven) days. Thursdays    Historical Provider, MD   BP 176/57 mmHg  Pulse 96  Temp(Src) 97.8 F (36.6 C) (Oral)  Resp 20  SpO2 98% Physical Exam  Constitutional: She is oriented to person, place, and time. She appears well-developed and well-nourished.  Obese  HENT:  Head: Normocephalic and atraumatic.  Mouth/Throat: Oropharynx is clear and moist.  Eyes: Conjunctivae are normal. Pupils are equal, round, and reactive to light. Right eye exhibits no discharge. Left eye exhibits no discharge. No scleral icterus.  Neck: Neck supple.  Cardiovascular: Normal rate, regular rhythm and normal heart sounds.   Pulmonary/Chest: Effort normal and breath sounds normal. No respiratory distress. She has no wheezes. She has no rales.  Abdominal: Soft. Bowel sounds are normal. She exhibits no distension and no mass. There is no tenderness. There is no rebound and no guarding.  No CVA tenderness  Musculoskeletal: She exhibits no tenderness.  Neurological: She is alert and oriented to person, place, and time.  Cranial Nerves II-XII grossly intact  Skin: Skin is warm and dry. No rash noted.  Psychiatric: She has a normal mood and affect.  Nursing note and vitals reviewed.   ED Course  Procedures (including critical care time) Labs Review Labs Reviewed  URINALYSIS, ROUTINE W REFLEX MICROSCOPIC (NOT AT [facility]) - Abnormal; Notable for the following:    APPearance CLOUDY (*)    Glucose, UA 250 (*)    Hgb urine dipstick  SMALL (*)    Leukocytes, UA MODERATE (*)    All other components within normal limits  URINE CULTURE  URINE MICROSCOPIC-ADD ON    Imaging Review No results found.   EKG Interpretation None     Meds given in ED:  Medications  ciprofloxacin (CIPRO) tablet 500 mg (not administered)    New Prescriptions   CIPROFLOXACIN (CIPRO) 500 MG TABLET    Take 1 tablet (500 mg total) by mouth 2 (two) times daily.   PHENAZOPYRIDINE (PYRIDIUM) 95 MG TABLET    Take 1 tablet (95 mg total) by mouth 3 (three) times daily as needed for pain.   Filed Vitals:   04/07/15 0855  BP: 176/57  Pulse: 96  Temp: 97.8 F (36.6 C)  TempSrc: Oral  Resp: 20  SpO2: 98%    MDM  Vitals stable -afebrile Pt resting comfortably in ED. PE--physical exam not concerning as mentioned above. Labwork--evidence of UTI on urinalysis with moderate leukocytes and too numerous to count white cells. Previously treated in the ED with IV Rocephin. Will obtain urine culture.  DDX--will treat with outpatient Cipro and Pyridium for symptom management. No evidence of urosepsis or other acute or emergent pathology at this time. I discussed all relevant lab findings and imaging results with pt and they verbalized understanding. Discussed f/u with PCP within 48 hrs and return precautions, pt very amenable to plan. Prior to patient discharge, I discussed and reviewed this case with Dr. Thomasene Lot   Final diagnoses:  UTI (lower urinary tract infection)      Comer Locket, PA-C 04/07/15 [place], MD 04/07/15 1520

## 2015-04-07 NOTE — ED Notes (Addendum)
Pt reported having burning with voiding, urinary frequency/urgency, lower abd pain/back pain, and denies foul odor, hematuria and discoloration.

## 2015-04-10 ENCOUNTER — Encounter: Payer: Self-pay | Admitting: Gastroenterology

## 2015-04-10 ENCOUNTER — Ambulatory Visit (INDEPENDENT_AMBULATORY_CARE_PROVIDER_SITE_OTHER): Payer: Medicare Other | Admitting: Gastroenterology

## 2015-04-10 VITALS — BP 134/50 | HR 80 | Ht 60.5 in | Wt 228.0 lb

## 2015-04-10 DIAGNOSIS — R1011 Right upper quadrant pain: Secondary | ICD-10-CM | POA: Diagnosis not present

## 2015-04-10 DIAGNOSIS — Z860100 Personal history of colon polyps, unspecified: Secondary | ICD-10-CM

## 2015-04-10 DIAGNOSIS — Z800 Family history of malignant neoplasm of digestive organs: Secondary | ICD-10-CM

## 2015-04-10 DIAGNOSIS — I255 Ischemic cardiomyopathy: Secondary | ICD-10-CM

## 2015-04-10 LAB — URINE CULTURE: Culture: >=100000

## 2015-04-10 NOTE — Patient Instructions (Signed)
You have been scheduled for a colonoscopy. Please follow written instructions given to you at your visit today.  Please pick up your prep supplies at the pharmacy within the next 1-3 days. If you use inhalers (even only as needed), please bring them with you on the day of your procedure. Your physician has requested that you go to www.startemmi.com and enter the access code given to you at your visit today. This web site gives a general overview about your procedure. However, you should still follow specific instructions given to you by our office regarding your preparation for the procedure.   We have sent medications to your pharmacy for you to pick up at your convenience.  You have been scheduled for a HIDA scan at [facility]iology (1st floor) on 04/25/2015. Please arrive 15 minutes prior to your scheduled appointment at  4:65KP. Make certain not to have anything to eat or drink at least 6 hours prior to your test. Should this appointment date or time not work well for you, please call radiology scheduling at [phone].  _____________________________________________________________________ hepatobiliary (HIDA) scan is an imaging procedure used to diagnose problems in the liver, gallbladder and bile ducts. In the HIDA scan, a radioactive chemical or tracer is injected into a vein in your arm. The tracer is handled by the liver like bile. Bile is a fluid produced and excreted by your liver that helps your digestive system break down fats in the foods you eat. Bile is stored in your gallbladder and the gallbladder releases the bile when you eat a meal. A special nuclear medicine scanner (gamma camera) tracks the flow of the tracer from your liver into your gallbladder and small intestine.  During your HIDA scan  You'll be asked to change into a hospital gown before your HIDA scan begins. Your health care team will position you on a table, usually on your back. The radioactive tracer is then  injected into a vein in your arm.The tracer travels through your bloodstream to your liver, where it's taken up by the bile-producing cells. The radioactive tracer travels with the bile from your liver into your gallbladder and through your bile ducts to your small intestine.You may feel some pressure while the radioactive tracer is injected into your vein. As you lie on the table, a special gamma camera is positioned over your abdomen taking pictures of the tracer as it moves through your body. The gamma camera takes pictures continually for about an hour. You'll need to keep still during the HIDA scan. This can become uncomfortable, but you may find that you can lessen the discomfort by taking deep breaths and thinking about other things. Tell your health care team if you're uncomfortable. The radiologist will watch on a computer the progress of the radioactive tracer through your body. The HIDA scan may be stopped when the radioactive tracer is seen in the gallbladder and enters your small intestine. This typically takes about an hour. In some cases extra imaging will be performed if original images aren't satisfactory, if morphine is given to help visualize the gallbladder or if the medication CCK is given to look at the contraction of the gallbladder. This test typically takes 2 hours to complete. ________________________________________________________________________

## 2015-04-11 ENCOUNTER — Telehealth (HOSPITAL_COMMUNITY): Payer: Self-pay

## 2015-04-11 NOTE — Telephone Encounter (Signed)
Post ED Visit - Positive Culture Follow-up  Culture report reviewed by antimicrobial stewardship pharmacist: []  Wes Arnett, Pharm.D., BCPS []  Heide Guile, Pharm.D., BCPS []  Alycia Rossetti, Pharm.D., BCPS []  [place], Pharm.D., BCPS, AAHIVP []  Legrand Como, Pharm.D., BCPS, AAHIVP []  Isac Sarna, Pharm.D., BCPS X  [facility]  Positive urine culture Treated with cipro, organism sensitive to the same and no further patient follow-up is required at this time.  Danielle Vargas 04/11/2015, 8:45 AM

## 2015-04-17 ENCOUNTER — Encounter: Payer: Self-pay | Admitting: Gastroenterology

## 2015-04-17 DIAGNOSIS — R1011 Right upper quadrant pain: Secondary | ICD-10-CM | POA: Insufficient documentation

## 2015-04-17 DIAGNOSIS — Z86010 Personal history of colonic polyps: Secondary | ICD-10-CM | POA: Insufficient documentation

## 2015-04-17 DIAGNOSIS — Z800 Family history of malignant neoplasm of digestive organs: Secondary | ICD-10-CM | POA: Insufficient documentation

## 2015-04-17 NOTE — Progress Notes (Signed)
     04/10/2015 Danielle Vargas [id number] 04/26/1951   History of Present Illness:  This is a 64 year old female who is known to Dr. Henrene Pastor for colonoscopy and evaluation of RUQ abdominal pain, last seen in 05/2012.  Her PMH includes IDDM, HTN, HLD, hypothyroidism, CAD, morbid obesity, OSA on CPAP.  She does have a history of gastroparesis and is taking Reglan 10 mg four times daily (I do not believe that it was prescribed at our facility).  She is here today with complaints of RUQ abdominal pain.  Pain comes and goes, usually after eating especially something fatty or greasy.  She thinks that the pain is from her gallbladder.  This pain has been recurrent for quite some time and had evaluation for biliary source in the past, which all proved negative.  EGD in 06/2012 by Dr. Henrene Pastor was normal.  Ultrasound in 2012 showed hepatic steatosis and then other ultrasound in July of this years showed mild splenomegaly, but no gallbladder findings.  HIDA scan in 06/2012 was normal with GB EF of 74%.  Recent CT scan of the abdomen and pelvis with contrast in July was relatively normal once again showed hepatic steatosis.  Recent lipase slightly elevated at 65, but once again, pancreas is normal on CT scan.  LFT's normal as well.  Has some nausea also, but is on an antibiotic for UTI and thinks the nausea may just be a side effect of that medication.  While she is here today she would like to discuss her colonoscopy and schedule her procedure.  Her last colonoscopy was by Dr. Henrene Pastor in 10/2009 at which time she was found to have only mild diverticulosis in the left colon and small internal hemorrhoids.  It was recommended that she have a repeat procedure in 5 years from that time due to a previous history of adenomatous polyps.  Current Medications, Allergies, Past Medical History, Past Surgical History, Family History and Social History were reviewed in [employer] record.   Physical Exam: BP  134/50 mmHg  Pulse 80  Ht 5' 0.5" (1.537 m)  Wt 228 lb (103.42 kg)  BMI 43.78 kg/m2 General: Well developed, white female in no acute distress Head: Normocephalic and atraumatic Eyes:  Sclerae anicteric, conjunctiva pink  Ears: Normal auditory acuity Lungs: Clear throughout to auscultation Heart: Regular rate and rhythm Abdomen: Soft, obese, non-distended.  Normal bowel sounds.  Non-tender. Rectal:  Will be done at the time of colonoscopy. Musculoskeletal: Symmetrical with no gross deformities  Extremities: No edema  Neurological: Alert oriented x 4, grossly non-focal Psychological:  Alert and cooperative. Normal mood and affect  Assessment and Recommendations: -RUQ abdominal pain:  This has been a recurrent issue for her.  She is convinced that her pain is from her gallbladder and quite honestly it sounds like biliary pain, however, previous gallbladder evaluation has been normal.  Will repeat HIDA scan.  If HIDA scan is normal then could consider a trial of anti-spasmodic such as Bentyl.  Pain may also be from capsular distention from hepatic steatosis.  -Personal history of colon polyps:  Last colonoscopy in 10/2009 with surveillance due in 10/2014.  Will schedule for colonoscopy with Dr. Henrene Pastor.  The risks, benefits, and alternatives to colonoscopy were discussed with the patient and she consents to proceed.  -Insulin dependent diabetes mellitus:  Insulin will be adjusted prior to endoscopic procedure per protocol. Will resume normal dosing after procedure.

## 2015-04-24 NOTE — Progress Notes (Signed)
Agree with initial assessment and plans 

## 2015-04-25 ENCOUNTER — Other Ambulatory Visit: Payer: Self-pay | Admitting: *Deleted

## 2015-04-25 ENCOUNTER — Ambulatory Visit (HOSPITAL_COMMUNITY)
Admission: RE | Admit: 2015-04-25 | Discharge: 2015-04-25 | Disposition: A | Discharge status: Home / Self Care | Payer: Medicare Other | Source: Ambulatory Visit | Attending: Gastroenterology | Admitting: Gastroenterology

## 2015-04-25 DIAGNOSIS — Z800 Family history of malignant neoplasm of digestive organs: Secondary | ICD-10-CM

## 2015-04-25 DIAGNOSIS — Z860100 Personal history of colon polyps, unspecified: Secondary | ICD-10-CM

## 2015-04-25 DIAGNOSIS — R1011 Right upper quadrant pain: Secondary | ICD-10-CM | POA: Diagnosis present

## 2015-04-25 MED ORDER — DICYCLOMINE HCL 20 MG PO TABS: ORAL_TABLET | ORAL | Status: DC

## 2015-04-25 MED ORDER — TECHNETIUM TC 99M MEBROFENIN IV KIT
5.3300 | PACK | Freq: Once | INTRAVENOUS | Status: DC | PRN
  Administered 2015-04-25: 5.33 via INTRAVENOUS
  Filled 2015-04-25: qty 6

## 2015-04-26 ENCOUNTER — Telehealth: Payer: Self-pay | Admitting: Internal Medicine

## 2015-04-26 DIAGNOSIS — E139 Other specified diabetes mellitus without complications: Secondary | ICD-10-CM

## 2015-04-26 NOTE — Telephone Encounter (Signed)
Time to order her insulin vials humulin R 500 please call into [facility] in [facility]  She has plenty of the syringes

## 2015-04-26 NOTE — Telephone Encounter (Signed)
OK, let's send a Rx for U500 regular insulin vials: inject under skin 40-60 units (0.08-0.12 mL) 3x a day before meals, # 1 vial, with 2 refills. Please advise her to draw in the syringe: - up to 8 units before a small meal - up to 10 units before a regular meal - up to 12 units before a large meal.  Please call with ANY questions. Also, call with sugars on Monday.

## 2015-04-27 ENCOUNTER — Telehealth: Payer: Self-pay | Admitting: Internal Medicine

## 2015-04-27 MED ORDER — INSULIN REGULAR HUMAN (CONC) 500 UNIT/ML ~~LOC~~ SOLN: 40.0000 [IU] | Freq: Three times a day (TID) | SUBCUTANEOUS | Status: DC

## 2015-04-27 NOTE — Telephone Encounter (Signed)
Patient ask if you would call it's about her medication, she wouldn't give me a message to give you.

## 2015-04-27 NOTE — Telephone Encounter (Signed)
Called pt and advised her per Dr Arman Filter message. Pt voiced that she is using a little less. Pt is using about 45 units daily. Pt will call back on Monday with sugar readings. Pt requested rx to be sent to [facility] instead of [place].

## 2015-04-27 NOTE — Telephone Encounter (Signed)
Called pt and worked out where to send rx refill. All taken care.

## 2015-04-27 NOTE — Addendum Note (Signed)
Addended by: Rockie Neighbours B on: 04/27/2015 09:39 AM   Modules accepted: Orders, Medications

## 2015-05-03 ENCOUNTER — Telehealth: Payer: Self-pay | Admitting: Internal Medicine

## 2015-05-03 ENCOUNTER — Telehealth: Payer: Self-pay | Admitting: *Deleted

## 2015-05-03 DIAGNOSIS — E139 Other specified diabetes mellitus without complications: Secondary | ICD-10-CM

## 2015-05-03 MED ORDER — INSULIN REGULAR HUMAN 100 UNIT/ML IJ SOLN: INTRAMUSCULAR | Status: DC

## 2015-05-03 NOTE — Telephone Encounter (Signed)
I just talked to her - She did not start it yet as he was not approved by her insurance. She is almost out of her U100 insulin. Let's try to send the PA today and if we have no answer by tomorrow, I advised her to let me know if I should refill her U100 prescription. If we do this, they should go to [facility] - 2 vials.

## 2015-05-03 NOTE — Telephone Encounter (Signed)
Pharmacy cannot get in humulin R 500, humulin r 100 they do have if we can call that in to [facility] on [address]  Please call pt before filling rx thank you

## 2015-05-03 NOTE — Telephone Encounter (Signed)
We have received a PA for Humulin R 500. I am working on this. Can you please advise me when the pt started the U500? Thank you.

## 2015-05-07 ENCOUNTER — Other Ambulatory Visit: Payer: Self-pay | Admitting: *Deleted

## 2015-05-07 ENCOUNTER — Telehealth: Payer: Self-pay | Admitting: Internal Medicine

## 2015-05-07 DIAGNOSIS — E139 Other specified diabetes mellitus without complications: Secondary | ICD-10-CM

## 2015-05-07 MED ORDER — INSULIN REGULAR HUMAN 100 UNIT/ML IJ SOLN: INTRAMUSCULAR | Status: DC

## 2015-05-07 NOTE — Telephone Encounter (Signed)
[facility] note dated 05/04/15 5:56 Chief Complaint Paging or Request for Consult Initial Comment Caller states Juliann Pulse from [facility] and needs to verify a prescription with on call. CB# [phone] Nurse Assessment Nurse: Ruthann Cancer RN, Apolonio Schneiders Date/Time (Eastern Time): 05/04/2015 7:48:46 PM Confirm and document reason for call. If symptomatic, describe symptoms. ---Caller is Juliann Pulse from [employer]. She was calling to clarify an insulin order for the patient but has come up with a solution for the patient to have her insulin this weekend & advised her to have the pt call office on Monday during regular hours if further medication orders are needed then. Has the patient traveled out of the country within the last 30 days? ---Not Applicable Does the patient require triage? ---Declined Triage

## 2015-05-07 NOTE — Telephone Encounter (Signed)
Called pharmacy and advised her that Dr Cruzita Lederer had sent the rx in on Thurs, but it was on [facility], so it did not go through escribe. Resent rx for the Humulin R100.

## 2015-05-10 ENCOUNTER — Other Ambulatory Visit (HOSPITAL_COMMUNITY): Payer: Self-pay | Admitting: *Deleted

## 2015-05-11 ENCOUNTER — Encounter (HOSPITAL_COMMUNITY)
Admission: RE | Admit: 2015-05-11 | Discharge: 2015-05-11 | Disposition: A | Discharge status: Home / Self Care | Payer: Medicare Other | Source: Ambulatory Visit | Attending: Internal Medicine | Admitting: Internal Medicine

## 2015-05-11 DIAGNOSIS — M810 Age-related osteoporosis without current pathological fracture: Secondary | ICD-10-CM | POA: Insufficient documentation

## 2015-05-11 MED ORDER — ZOLEDRONIC ACID 5 MG/100ML IV SOLN
5.0000 mg | Freq: Once | INTRAVENOUS | Status: AC
  Administered 2015-05-11: 5 mg via INTRAVENOUS

## 2015-05-11 MED ORDER — ZOLEDRONIC ACID 5 MG/100ML IV SOLN
INTRAVENOUS | Status: DC
Start: 2015-05-11 — End: 2015-05-12
  Filled 2015-05-11: qty 100

## 2015-05-11 MED ORDER — SODIUM CHLORIDE 0.9 % IV SOLN
INTRAVENOUS | Status: DC
  Administered 2015-05-11: 13:00:00 via INTRAVENOUS

## 2015-05-15 NOTE — Progress Notes (Signed)
Cardiology Office Note   Date:  05/16/2015   ID:  Danielle Vargas, DOB Dec 25, 1950, MRN [id number]  Patient Care Team: Marton Redwood, MD as PCP - General (Internal Medicine) Belva Crome, MD as Consulting Physician (Cardiology) Philemon Kingdom, MD as Consulting Physician (Endocrinology)    Chief Complaint  Patient presents with  . Coronary Artery Disease  . Follow-up    HTN     History of Present Illness: Danielle Vargas is a 64 y.o. female with a hx of CAD, ischemic cardiomyopathy, OSA, HTN, HL. 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 This was treated with angioplasty. EF was 35-40% at the time. Follow-up echo 5/15 demonstrated improved LV function with an EF of 55%. Last seen by Dr. Tamala Julian 10/2014. Plavix was discontinued. Beta blocker dose was reduced secondary to low blood pressure.   I last saw her 04/04/15. Blood pressure was running somewhat low and she was symptomatic with this. I stopped her amlodipine and brought her back today for follow-up.  Here today by herself.  She has been feeling good.  BPs have been optimal.  Today, she started on a new short acting insulin.  She felt overheated outside when she got here. She was lightheaded and BP was 98/40 initially.  She took a sugar tab x 2 and now feels better. BP is 122/60 now.  She denies chest pain, dyspnea, syncope, orthopnea, PND.  LE edema is improved.      Studies/Reports Reviewed Today:  Echo 01/23/14 Mod LVH, EF 55%, small effusion  LHC 10/26/13 ANGIOGRAPHIC DATA:  LM: short but widely patent . LAD:  prox 30-40%  LCx:   The first obtuse marginal is totally occluded in its distal half. The remainder of the circumflex is widely patent . RCA:  without any significant obstruction. PCI RESULTS: Angioplasty of the distal OM 1 segment reduced C1 100% stenosis to 0% with TIMI grade 3 flow. A small branch  of this artery remained occluded and was seen to fill by contrast staining but did not have good antegrade flow to  LEFT VENTRICULOGRAM: Left ventricular angiogram was done in the 30 RAO projection and revealed apical severe hypokinesis/akinesis with an estimated ejection fraction of 45-50%  IMPRESSIONS: 1. Acute lateral wall ST elevation myocardial infarction do to acute occlusion of the distal half of the first obtuse marginal. This could have been related to spontaneous coronary artery dissection.  2. Successful angioplasty of the first obtuse marginal with reestablishment of flow to the great majority of the distal bed in question. 3. Widely patent circumflex, RCA, and LAD. The LAD contains proximal calcification and less than 50% stenosis. 4. Left ventricular dysfunction with severe inferoapical hypokinesis   Past Medical History  Diagnosis Date  . DM (diabetes mellitus)   . Obesity   . Dyslipidemia   . HTN (hypertension)   . Hypothyroidism   . Gastroparesis   . Migraine   . Osteoporosis   . Colon polyps 7/07    hyperplastic and adenomatous  . Nephrolithiasis   . Diverticulosis   . External hemorrhoid   . Fatty liver   . Asthma   . CAD (coronary artery disease)     a. 12/2008 Cath: mild irregs throughout, EF 75%.  . 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 Allergy   . Cataract     "just beginning"  . Myocardial infarction  FEb 2015  . Depression     pt unsure, psychologist said no  . Sleep apnea     wears CPAP  . Hyperlipidemia     Past Surgical History  Procedure Laterality Date  . Tubal ligation  '85  . Breast reduction surgery  '88  . Cervical disc surgery  '98    fusion  . Carpal tunnel release  2000    right wrist  . Rotator cuff repair  '09    left shoulder  . Ulnar nerve repair  '02, '08    left done, then right  . Cesarean section  '79  '84  . Left heart catheterization with coronary angiogram N/A 10/26/2013    Procedure: LEFT HEART  CATHETERIZATION WITH CORONARY ANGIOGRAM;  Surgeon: Sinclair Grooms, MD;  Location[facility] C[facility] LAB;  Service: Cardiovascular;  Laterality: N/A;  . Colonoscopy       Current Outpatient Prescriptions  Medication Sig Dispense Refill  . albuterol (PROVENTIL HFA;VENTOLIN HFA) 108 (90 BASE) MCG/ACT inhaler Inhale 2 puffs into the lungs every 6 (six) hours as needed. For shortness of breath.    Marland Kitchen aspirin EC 81 MG tablet Take 81 mg by mouth every morning.    . calcium-vitamin D (CALCIUM 500/D) 500-200 MG-UNIT per tablet Take 1 tablet by mouth every evening.     . carisoprodol (SOMA) 350 MG tablet Take 350 mg by mouth 4 (four) times daily.     . carvedilol (COREG) 12.5 MG tablet Take 1 tablet (12.5 mg total) by mouth 2 (two) times daily with a meal. 180 tablet 3  . Coenzyme Q10 (CO Q 10) 100 MG CAPS Take 1 tablet by mouth 2 (two) times daily.     . CRESTOR 20 MG tablet TAKE 1 TABLET EVERY DAY 90 tablet 1  . dicyclomine (BENTYL) 20 MG tablet Take one po BID 60 tablet 0  . fenofibrate 160 MG tablet Take 160 mg by mouth at bedtime.     . furosemide (LASIX) 20 MG tablet Take 1 tablet (20 mg total) by mouth every morning. 90 tablet 3  . gabapentin (NEURONTIN) 300 MG capsule Take 300 mg by mouth 3 (three) times daily.    . insulin regular (NOVOLIN R,HUMULIN R) 100 units/mL injection Inject 40-50 units into the skin 3 times daily. Dx code; E13.9 30 mL 2  . insulin regular human CONCENTRATED (HUMULIN R) 500 UNIT/ML injection Inject 0.08-0.1 mLs (40-50 Units total) into the skin 3 (three) times daily with meals. Dx: E13.9 20 mL 2  . levothyroxine (SYNTHROID, LEVOTHROID) 75 MCG tablet Take 75 mcg by mouth daily before breakfast.     . losartan (COZAAR) 50 MG tablet Take 1 tablet by mouth daily.    . metFORMIN (GLUCOPHAGE) 1000 MG tablet Take 1,000 mg by mouth 2 (two) times daily with a meal.    . metoCLOPramide (REGLAN) 10 MG tablet Take 10 mg by mouth 4 (four) times daily. Take every day per patient    .  montelukast (SINGULAIR) 10 MG tablet Take 10 mg by mouth at bedtime.    . nitroGLYCERIN (NITROSTAT) 0.4 MG SL tablet Place 1 tablet (0.4 mg total) under the tongue every 5 (five) minutes as needed for chest pain. 25 tablet 3  . omeprazole (PRILOSEC) 20 MG capsule Take 20 mg by mouth 2 (two) times daily before a meal.    . oxyCODONE-acetaminophen (PERCOCET) 10-325 MG per tablet take 1 tablet by mouth three times a day if needed  0  .  polyethylene glycol (MIRALAX / GLYCOLAX) packet Take 17 g by mouth daily as needed for mild constipation.     . topiramate (TOPAMAX) 50 MG tablet Take 50 mg by mouth 2 (two) times daily.    . Vitamin D, Ergocalciferol, (DRISDOL) 50000 UNITS CAPS Take 50,000 Units by mouth every 7 (seven) days. Thursdays     No current facility-administered medications for this visit.    Allergies:   Invokana; Nsaids; and Penicillins    Social History:  The patient  reports that she has never smoked. She has never used smokeless tobacco. She reports that she does not drink alcohol or use illicit drugs.   Family History:  The patient's family history includes COPD in her father; Cirrhosis in her sister; Colon cancer in an other family member; Diabetes in her brother and mother; Emphysema in her father; Heart attack in her brother, mother, and paternal grandfather; Heart failure in her brother and mother; Hypertension in her brother; Kidney disease in her mother; Liver disease in her sister; Lung cancer in her brother; Other in an other family member; Stroke in her father. There is no history of Esophageal cancer, Stomach cancer, or Rectal cancer.    ROS:   Please see the history of present illness.   Review of Systems  All other systems reviewed and are negative.     PHYSICAL EXAM: VS:  BP 122/60 mmHg  Pulse 74  Ht 5\' 1"  (1.549 m)  Wt 220 lb (99.791 kg)  BMI 41.59 kg/m2    Wt Readings from Last 3 Encounters:  05/16/15 220 lb (99.791 kg)  05/11/15 218 lb (98.884 kg)    04/10/15 228 lb (103.42 kg)     GEN: Well nourished, well developed, in no acute distress HEENT: normal Neck: no JVD,  no masses Cardiac:  Normal S1/S2, RRR; no murmur ,  no rubs or gallops, no bilateral LE edema   Respiratory:  clear to auscultation bilaterally, no wheezing, rhonchi or rales. GI: soft, nontender, nondistended, + BS MS: no deformity or atrophy Skin: warm and dry  Neuro:  CNs II-XII intact, Strength and sensation are intact Psych: Normal affect   EKG:  EKG is ordered today.  It demonstrates:  NSR, HR 74, RSR' V1-2, no change from prior tracing   Recent Labs: 03/21/2015: ALT 21; BUN 14; Creatinine, Ser 1.10*; Hemoglobin 10.4*; Platelets 306; Potassium 3.8; Sodium 135    Lipid Panel    Component Value Date/Time   CHOL 115 10/27/2013 0307   TRIG 236* 10/27/2013 0307   HDL 21* 10/27/2013 0307   CHOLHDL 5.5 10/27/2013 0307   VLDL 47* 10/27/2013 0307   LDLCALC 47 10/27/2013 0307      ASSESSMENT AND PLAN:  Coronary artery disease involving native coronary artery of native heart without angina pectoris:  No angina. Continue aspirin, beta blocker, ARB, statin.  Ischemic cardiomyopathy:  EF improved to normal by last echocardiogram. Volume appears stable. Continue beta blocker, ARB.  Essential hypertension:  BP is improved.  Recent readings have been optimal.  Continue current regimen.  She was somewhat hypotensive when she got here today.  However, I believe this was related to hypoglycemia.  Her sugar after 2 glucose tablets was 229 and she was feeling better at that point.  I asked her to hold her Lasix for 2 days and to continue to monitor her BP and glucose.    Hyperlipidemia:  Continue statin. Follow-up primary care.  OSA on CPAP: Continue CPAP.   Diabetes Mellitus:  FU with Endocrine.  If she has more episodes of hypoglycemia, she will need earlier FU with Endocrine.      Medication Changes: Current medicines are reviewed at length with the patient  today.  Concerns regarding medicines are as outlined above.  The following changes have been made:   Discontinued Medications   CIPROFLOXACIN (CIPRO) 500 MG TABLET    Take 1 tablet (500 mg total) by mouth 2 (two) times daily.   HYDROCODONE-ACETAMINOPHEN (NORCO) 5-325 MG PER TABLET    Take 1 tablet by mouth every 6 (six) hours as needed. For joint and back pain.   INSULIN GLARGINE (LANTUS) 100 UNIT/ML INJECTION    Inject under skin 40 units am and 40 units at bedtime.   NITROFURANTOIN, MACROCRYSTAL-MONOHYDRATE, (MACROBID) 100 MG CAPSULE    Take 1 capsule by mouth 3 (three) times daily.   PHENAZOPYRIDINE (PYRIDIUM) 95 MG TABLET    Take 1 tablet (95 mg total) by mouth 3 (three) times daily as needed for pain.   Modified Medications   No medications on file   New Prescriptions   No medications on file    Labs/ tests ordered today include:   Orders Placed This Encounter  Procedures  . EKG 12-Lead     Disposition:   FU with Dr. Daneen Schick 2/17    Signed, Richardson Dopp, Hershal Coria, MHS 05/16/2015 12:56 PM    [address], [place], [place]  [address] Phone: [phone]; Fax: [phone]

## 2015-05-16 ENCOUNTER — Encounter: Payer: Self-pay | Admitting: Physician Assistant

## 2015-05-16 ENCOUNTER — Ambulatory Visit (INDEPENDENT_AMBULATORY_CARE_PROVIDER_SITE_OTHER): Payer: Medicare Other | Admitting: Physician Assistant

## 2015-05-16 ENCOUNTER — Ambulatory Visit: Payer: Medicare Other | Admitting: Internal Medicine

## 2015-05-16 VITALS — BP 122/60 | HR 74 | Ht 61.0 in | Wt 220.0 lb

## 2015-05-16 DIAGNOSIS — I2510 Atherosclerotic heart disease of native coronary artery without angina pectoris: Secondary | ICD-10-CM | POA: Diagnosis not present

## 2015-05-16 DIAGNOSIS — I10 Essential (primary) hypertension: Secondary | ICD-10-CM | POA: Diagnosis not present

## 2015-05-16 DIAGNOSIS — E785 Hyperlipidemia, unspecified: Secondary | ICD-10-CM | POA: Diagnosis not present

## 2015-05-16 DIAGNOSIS — E0849 Diabetes mellitus due to underlying condition with other diabetic neurological complication: Secondary | ICD-10-CM

## 2015-05-16 DIAGNOSIS — I255 Ischemic cardiomyopathy: Secondary | ICD-10-CM | POA: Diagnosis not present

## 2015-05-16 DIAGNOSIS — Z9989 Dependence on other enabling machines and devices: Secondary | ICD-10-CM

## 2015-05-16 DIAGNOSIS — G4733 Obstructive sleep apnea (adult) (pediatric): Secondary | ICD-10-CM

## 2015-05-16 NOTE — Patient Instructions (Signed)
Medication Instructions:  HOLD LASIX FOR 2 DAYS THEN RESUME YOUR CURRENT DOSE  Labwork: NONE  Testing/Procedures: NONE  Follow-Up: DR. Tamala Julian 10/2015; WE WILL SEND OUT A REMINDER LETTER A COUPLE MONTHS IN [facility]VANCED  Any Other Special Instructions Will Be Listed Below (If Applicable). MONITOR BLOOD PRESSURE AND IF BP CONTINUES TO REMAIN LOW  SYSTOLIC BP BELOW 683 PLEASE CALL THE OFFICE [phone]

## 2015-06-04 ENCOUNTER — Telehealth: Payer: Self-pay | Admitting: Interventional Cardiology

## 2015-06-04 NOTE — Telephone Encounter (Signed)
Pt c/o BP issue: STAT if pt c/o blurred vision, one-sided weakness or slurred speech  1. What are your last 5 BP readings? Yesterday 102/52, today after not taking lasix pt recorded at 157/59  2. Are you having any other symptoms (ex. Dizziness, headache, blurred vision, passed out)? Yesterday (9/25) felt like she was going to pass/out and extreme sweating, today 9/26 pt c/o weakness and fatigue  3. What is your BP issue? Pt feeling BP is too low-- having to do to Lasix. Please call back and discuss.

## 2015-06-04 NOTE — Telephone Encounter (Signed)
Pt called with concerns that Lasix (20mg  QD) was making her BP to low as to why she was having the 102/52 BP with HR of 83. Pt stated that yesterday she was standing and felt dizzy, weak, shaky, and sweaty like she was going to pass out, but never passed out.  Pt stated that this is the 2nd time this month this has happened and thinks that it is her Lasix.  She didn't her Lasix today and her BP was 157/59 and she if feeling better.  Pt saw Kathleen Argue for similar reason medications were adjusted and she has felt better until yesterday.  Pt stated during that episode she checked her blood sugar and it was fine.  Pt instructed to check weights daily, BP daily, and log salt intake for the next week and see if it gets any better.  Pt also instructed to keep taking the Lasix and be mindful of her blood sugar also if another episode happens.  Pt verbalized understanding no additional questions at this time.

## 2015-06-11 ENCOUNTER — Telehealth: Payer: Self-pay

## 2015-06-11 ENCOUNTER — Ambulatory Visit: Payer: Medicare Other | Admitting: Neurology

## 2015-06-11 NOTE — Telephone Encounter (Signed)
Pt did not show for their appt with Dr. Dohmeier today.  

## 2015-06-13 ENCOUNTER — Encounter: Payer: Self-pay | Admitting: Neurology

## 2015-06-16 ENCOUNTER — Encounter (HOSPITAL_COMMUNITY): Payer: Self-pay | Admitting: Emergency Medicine

## 2015-06-16 ENCOUNTER — Emergency Department (HOSPITAL_COMMUNITY): Payer: Medicare Other

## 2015-06-16 ENCOUNTER — Inpatient Hospital Stay (HOSPITAL_COMMUNITY)
Admission: EM | Admit: 2015-06-16 | Discharge: 2015-06-17 | DRG: 694 | Disposition: A | Discharge status: Home / Self Care | Payer: Medicare Other | Attending: Internal Medicine | Admitting: Internal Medicine

## 2015-06-16 DIAGNOSIS — Z79899 Other long term (current) drug therapy: Secondary | ICD-10-CM

## 2015-06-16 DIAGNOSIS — E1143 Type 2 diabetes mellitus with diabetic autonomic (poly)neuropathy: Secondary | ICD-10-CM | POA: Diagnosis present

## 2015-06-16 DIAGNOSIS — E669 Obesity, unspecified: Secondary | ICD-10-CM | POA: Diagnosis present

## 2015-06-16 DIAGNOSIS — I10 Essential (primary) hypertension: Secondary | ICD-10-CM | POA: Diagnosis present

## 2015-06-16 DIAGNOSIS — N390 Urinary tract infection, site not specified: Secondary | ICD-10-CM | POA: Diagnosis present

## 2015-06-16 DIAGNOSIS — E785 Hyperlipidemia, unspecified: Secondary | ICD-10-CM | POA: Diagnosis present

## 2015-06-16 DIAGNOSIS — I252 Old myocardial infarction: Secondary | ICD-10-CM

## 2015-06-16 DIAGNOSIS — N3001 Acute cystitis with hematuria: Secondary | ICD-10-CM | POA: Diagnosis not present

## 2015-06-16 DIAGNOSIS — Z880 Allergy status to penicillin: Secondary | ICD-10-CM

## 2015-06-16 DIAGNOSIS — Z8249 Family history of ischemic heart disease and other diseases of the circulatory system: Secondary | ICD-10-CM | POA: Diagnosis not present

## 2015-06-16 DIAGNOSIS — E1340 Other specified diabetes mellitus with diabetic neuropathy, unspecified: Secondary | ICD-10-CM | POA: Diagnosis not present

## 2015-06-16 DIAGNOSIS — Z87442 Personal history of urinary calculi: Secondary | ICD-10-CM

## 2015-06-16 DIAGNOSIS — R109 Unspecified abdominal pain: Secondary | ICD-10-CM

## 2015-06-16 DIAGNOSIS — M810 Age-related osteoporosis without current pathological fracture: Secondary | ICD-10-CM | POA: Diagnosis present

## 2015-06-16 DIAGNOSIS — G4733 Obstructive sleep apnea (adult) (pediatric): Secondary | ICD-10-CM | POA: Diagnosis present

## 2015-06-16 DIAGNOSIS — E039 Hypothyroidism, unspecified: Secondary | ICD-10-CM | POA: Diagnosis present

## 2015-06-16 DIAGNOSIS — E1165 Type 2 diabetes mellitus with hyperglycemia: Secondary | ICD-10-CM | POA: Diagnosis not present

## 2015-06-16 DIAGNOSIS — N132 Hydronephrosis with renal and ureteral calculous obstruction: Principal | ICD-10-CM | POA: Diagnosis present

## 2015-06-16 DIAGNOSIS — Z833 Family history of diabetes mellitus: Secondary | ICD-10-CM

## 2015-06-16 DIAGNOSIS — I2510 Atherosclerotic heart disease of native coronary artery without angina pectoris: Secondary | ICD-10-CM | POA: Diagnosis present

## 2015-06-16 DIAGNOSIS — Z825 Family history of asthma and other chronic lower respiratory diseases: Secondary | ICD-10-CM | POA: Diagnosis not present

## 2015-06-16 DIAGNOSIS — Z823 Family history of stroke: Secondary | ICD-10-CM

## 2015-06-16 DIAGNOSIS — Z801 Family history of malignant neoplasm of trachea, bronchus and lung: Secondary | ICD-10-CM

## 2015-06-16 DIAGNOSIS — Z86010 Personal history of colonic polyps: Secondary | ICD-10-CM

## 2015-06-16 DIAGNOSIS — Z6841 Body Mass Index (BMI) 40.0 and over, adult: Secondary | ICD-10-CM | POA: Diagnosis not present

## 2015-06-16 DIAGNOSIS — J45909 Unspecified asthma, uncomplicated: Secondary | ICD-10-CM | POA: Diagnosis present

## 2015-06-16 DIAGNOSIS — Z794 Long term (current) use of insulin: Secondary | ICD-10-CM

## 2015-06-16 DIAGNOSIS — Z888 Allergy status to other drugs, medicaments and biological substances status: Secondary | ICD-10-CM

## 2015-06-16 DIAGNOSIS — D72829 Elevated white blood cell count, unspecified: Secondary | ICD-10-CM | POA: Diagnosis not present

## 2015-06-16 LAB — BASIC METABOLIC PANEL
ANION GAP: 9 (ref 5–15)
BUN: 11 mg/dL (ref 6–20)
CHLORIDE: 102 mmol/L (ref 101–111)
CO2: 23 mmol/L (ref 22–32)
Calcium: 9.6 mg/dL (ref 8.9–10.3)
Creatinine, Ser: 1.21 mg/dL — ABNORMAL HIGH (ref 0.44–1.00)
GFR calc Af Amer: 54 mL/min — ABNORMAL LOW (ref >60)
GFR, EST NON AFRICAN AMERICAN: 47 mL/min — AB (ref >60)
GLUCOSE: 252 mg/dL — AB (ref 65–99)
POTASSIUM: 4.5 mmol/L (ref 3.5–5.1)
Sodium: 134 mmol/L — ABNORMAL LOW (ref 135–145)

## 2015-06-16 LAB — URINALYSIS, ROUTINE W REFLEX MICROSCOPIC
Bilirubin Urine: NEGATIVE
GLUCOSE, UA: 250 mg/dL — AB
Ketones, ur: NEGATIVE mg/dL
Nitrite: POSITIVE — AB
PH: 7 (ref 5.0–8.0)
Protein, ur: NEGATIVE mg/dL
Specific Gravity, Urine: 1.017 (ref 1.005–1.030)
Urobilinogen, UA: 0.2 mg/dL (ref 0.0–1.0)

## 2015-06-16 LAB — GLUCOSE, CAPILLARY
Glucose-Capillary: 185 mg/dL — ABNORMAL HIGH (ref 65–99)
Glucose-Capillary: 205 mg/dL — ABNORMAL HIGH (ref 65–99)

## 2015-06-16 LAB — CBC
HEMATOCRIT: 36.8 % (ref 36.0–46.0)
HEMOGLOBIN: 11.3 g/dL — AB (ref 12.0–15.0)
MCH: 26.5 pg (ref 26.0–34.0)
MCHC: 30.7 g/dL (ref 30.0–36.0)
MCV: 86.4 fL (ref 78.0–100.0)
Platelets: 280 10*3/uL (ref 150–400)
RBC: 4.26 MIL/uL (ref 3.87–5.11)
RDW: 16.8 % — ABNORMAL HIGH (ref 11.5–15.5)
WBC: 11.7 10*3/uL — AB (ref 4.0–10.5)

## 2015-06-16 LAB — URINE MICROSCOPIC-ADD ON

## 2015-06-16 MED ORDER — GABAPENTIN 300 MG PO CAPS
300.0000 mg | ORAL_CAPSULE | Freq: Three times a day (TID) | ORAL | Status: DC
  Administered 2015-06-16 – 2015-06-17 (×3): 300 mg via ORAL
  Filled 2015-06-16 (×3): qty 1

## 2015-06-16 MED ORDER — HYDROMORPHONE HCL 1 MG/ML IJ SOLN
1.0000 mg | Freq: Once | INTRAMUSCULAR | Status: AC
  Administered 2015-06-16: 1 mg via INTRAVENOUS
  Filled 2015-06-16: qty 1

## 2015-06-16 MED ORDER — INSULIN REGULAR HUMAN (CONC) 500 UNIT/ML ~~LOC~~ SOLN: 40.0000 [IU] | Freq: Three times a day (TID) | SUBCUTANEOUS | Status: DC

## 2015-06-16 MED ORDER — VITAMIN D (ERGOCALCIFEROL) 1.25 MG (50000 UNIT) PO CAPS
50000.0000 [IU] | ORAL_CAPSULE | ORAL | Status: DC
  Filled 2015-06-16: qty 1

## 2015-06-16 MED ORDER — METOCLOPRAMIDE HCL 10 MG PO TABS
10.0000 mg | ORAL_TABLET | Freq: Four times a day (QID) | ORAL | Status: DC
  Administered 2015-06-16 – 2015-06-17 (×3): 10 mg via ORAL
  Filled 2015-06-16 (×3): qty 1

## 2015-06-16 MED ORDER — INSULIN ASPART 100 UNIT/ML ~~LOC~~ SOLN
0.0000 [IU] | Freq: Three times a day (TID) | SUBCUTANEOUS | Status: DC
Start: 2015-06-16 — End: 2015-06-17
  Administered 2015-06-16: 3 [IU] via SUBCUTANEOUS
  Administered 2015-06-17: 5 [IU] via SUBCUTANEOUS

## 2015-06-16 MED ORDER — HYDROMORPHONE HCL 1 MG/ML IJ SOLN
0.5000 mg | INTRAMUSCULAR | Status: DC | PRN
  Administered 2015-06-16 – 2015-06-17 (×3): 0.5 mg via INTRAVENOUS
  Filled 2015-06-16 (×3): qty 1

## 2015-06-16 MED ORDER — ONDANSETRON HCL 4 MG/2ML IJ SOLN: 4.0000 mg | Freq: Three times a day (TID) | INTRAMUSCULAR | Status: AC | PRN

## 2015-06-16 MED ORDER — INSULIN ASPART 100 UNIT/ML ~~LOC~~ SOLN
6.0000 [IU] | Freq: Three times a day (TID) | SUBCUTANEOUS | Status: DC
  Administered 2015-06-16: 6 [IU] via SUBCUTANEOUS

## 2015-06-16 MED ORDER — ACETAMINOPHEN 325 MG PO TABS: 650.0000 mg | ORAL_TABLET | Freq: Four times a day (QID) | ORAL | Status: DC | PRN

## 2015-06-16 MED ORDER — HYDROMORPHONE HCL 1 MG/ML IJ SOLN
0.5000 mg | INTRAMUSCULAR | Status: DC | PRN
  Administered 2015-06-16: 0.5 mg via INTRAVENOUS
  Filled 2015-06-16: qty 1

## 2015-06-16 MED ORDER — CARVEDILOL 12.5 MG PO TABS
12.5000 mg | ORAL_TABLET | Freq: Two times a day (BID) | ORAL | Status: DC
  Administered 2015-06-16 – 2015-06-17 (×2): 12.5 mg via ORAL
  Filled 2015-06-16 (×2): qty 1

## 2015-06-16 MED ORDER — HEPARIN SODIUM (PORCINE) 5000 UNIT/ML IJ SOLN
5000.0000 [IU] | Freq: Three times a day (TID) | INTRAMUSCULAR | Status: DC
  Administered 2015-06-16: 5000 [IU] via SUBCUTANEOUS
  Filled 2015-06-16 (×3): qty 1

## 2015-06-16 MED ORDER — DEXTROSE 5 % IV SOLN: 1.0000 g | INTRAVENOUS | Status: DC

## 2015-06-16 MED ORDER — CARISOPRODOL 350 MG PO TABS
350.0000 mg | ORAL_TABLET | Freq: Four times a day (QID) | ORAL | Status: DC
  Administered 2015-06-16 – 2015-06-17 (×2): 350 mg via ORAL
  Filled 2015-06-16 (×2): qty 1

## 2015-06-16 MED ORDER — DEXTROSE 5 % IV SOLN
1.0000 g | INTRAVENOUS | Status: DC
  Filled 2015-06-16: qty 10

## 2015-06-16 MED ORDER — SODIUM CHLORIDE 0.9 % IV BOLUS (SEPSIS)
1000.0000 mL | Freq: Once | INTRAVENOUS | Status: AC
  Administered 2015-06-16: 1000 mL via INTRAVENOUS

## 2015-06-16 MED ORDER — DEXTROSE 5 % IV SOLN
1.0000 g | Freq: Once | INTRAVENOUS | Status: AC
  Administered 2015-06-16: 1 g via INTRAVENOUS
  Filled 2015-06-16: qty 10

## 2015-06-16 MED ORDER — INSULIN ASPART 100 UNIT/ML ~~LOC~~ SOLN
0.0000 [IU] | Freq: Every day | SUBCUTANEOUS | Status: DC
  Administered 2015-06-16: 2 [IU] via SUBCUTANEOUS

## 2015-06-16 MED ORDER — ACETAMINOPHEN 650 MG RE SUPP: 650.0000 mg | Freq: Four times a day (QID) | RECTAL | Status: DC | PRN

## 2015-06-16 MED ORDER — KETOROLAC TROMETHAMINE 30 MG/ML IJ SOLN
30.0000 mg | Freq: Once | INTRAMUSCULAR | Status: AC
  Administered 2015-06-16: 30 mg via INTRAVENOUS
  Filled 2015-06-16: qty 1

## 2015-06-16 MED ORDER — LEVOTHYROXINE SODIUM 50 MCG PO TABS
75.0000 ug | ORAL_TABLET | Freq: Every day | ORAL | Status: DC
  Administered 2015-06-17: 75 ug via ORAL
  Filled 2015-06-16 (×2): qty 1

## 2015-06-16 MED ORDER — HYDROMORPHONE HCL 1 MG/ML IJ SOLN: 1.0000 mg | INTRAMUSCULAR | Status: DC | PRN

## 2015-06-16 NOTE — ED Notes (Signed)
Urology MD at bedside

## 2015-06-16 NOTE — ED Provider Notes (Signed)
CSN: [id number]     Arrival date & time 06/16/15  1109 History   First MD Initiated Contact with Patient 06/16/15 1112     Chief Complaint  Patient presents with  . Flank Pain    left sided     (Consider location/radiation/quality/duration/timing/severity/associated sxs/prior Treatment) HPI   Patient is a 64 year old female with history of IDDM, obesity, hypertension, gastroparesis, kidney stone, she presents emergency Department with 3 days of generalized abdominal pain, which she believed was gastroparesis, that decreased with stool softeners and pain medicine at home, however yesterday she had worsening left flank pain which radiates around the left side of her abdomen, hip and into her "bladder".  Since its onset yesterday and has rapidly worsened and associated with chills. She denies any dysuria, hematuria, nausea, vomiting, diarrhea, fever, sweats.  She presented to the emergency department today after her pain was not relieved with 10 mg of hydrocodone and muscle relaxers.  She reports a history of kidney stones which she has been able to pass without intervention by stents or lithotripsy. She states she is a patient of [facility] however does not remember her physician's name, and has not been seen by their practice in some time.  She denies any other complaints at this time. She denies chest pain, shortness of breath, weakness, lightheadedness.   Past Medical History  Diagnosis Date  . DM (diabetes mellitus) ([place])   . Obesity   . Dyslipidemia   . HTN (hypertension)   . Hypothyroidism   . Gastroparesis   . Migraine   . Osteoporosis   . Colon polyps 7/07    hyperplastic and adenomatous  . Nephrolithiasis   . Diverticulosis   . External hemorrhoid   . Fatty liver   . Asthma   . CAD (coronary artery disease)     a. 12/2008 Cath: mild irregs throughout, EF 75%.  . 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 Allergy   . Cataract     "just beginning"  . Myocardial  infarction ([place])     FEb 2015  . Depression     pt unsure, psychologist said no  . Sleep apnea     wears CPAP  . Hyperlipidemia    Past Surgical History  Procedure Laterality Date  . Tubal ligation  '85  . Breast reduction surgery  '88  . Cervical disc surgery  '98    fusion  . Carpal tunnel release  2000    right wrist  . Rotator cuff repair  '09    left shoulder  . Ulnar nerve repair  '02, '08    left done, then right  . Cesarean section  '79  '84  . Left heart catheterization with coronary angiogram N/A 10/26/2013    Procedure: LEFT HEART CATHETERIZATION WITH CORONARY ANGIOGRAM;  Surgeon: Sinclair Grooms, MD;  Location: [facility] CATH LAB;  Service: Cardiovascular;  Laterality: N/A;  . Colonoscopy     Family History  Problem Relation Age of Onset  . COPD Father   . Stroke Father   . Emphysema Father   . Heart failure Brother   . Heart failure Mother   . Heart attack Mother     several  . Diabetes Mother   . Kidney disease Mother   . Hypertension Brother   . Colon cancer      mat. 1st cousin  . Other      celiac sprue, 1/2 brother  . Diabetes Brother     x 3  .  Lung cancer Brother   . Cirrhosis Sister     liver transplant  . Liver disease Sister     transplant  . Heart attack Paternal Grandfather   . Heart attack Brother   . Esophageal cancer Neg Hx   . Stomach cancer Neg Hx   . Rectal cancer Neg Hx    Social History  Substance Use Topics  . Smoking status: Never Smoker   . Smokeless tobacco: Never Used  . Alcohol Use: No   OB History    No data available     Review of Systems  Constitutional: Positive for chills. Negative for fever, diaphoresis and fatigue.  HENT: Negative.   Respiratory: Negative.  Negative for chest tightness, shortness of breath and wheezing.   Cardiovascular: Negative.  Negative for chest pain, palpitations and leg swelling.  Gastrointestinal: Positive for abdominal pain. Negative for nausea, vomiting, diarrhea, constipation,  blood in stool, abdominal distention, anal bleeding and rectal pain.  Genitourinary: Positive for flank pain. Negative for dysuria, hematuria and difficulty urinating.  Musculoskeletal: Negative.   Skin: Negative.   Neurological: Negative.  Negative for dizziness and weakness.  Psychiatric/Behavioral: Negative.       Allergies  Invokana; Nsaids; and Penicillins  Home Medications   Prior to Admission medications   Medication Sig Start Date End Date Taking? Authorizing Provider  albuterol (PROVENTIL HFA;VENTOLIN HFA) 108 (90 BASE) MCG/ACT inhaler Inhale 2 puffs into the lungs every 6 (six) hours as needed. For shortness of breath.    Historical Provider, MD  aspirin EC 81 MG tablet Take 81 mg by mouth every morning.    Historical Provider, MD  calcium-vitamin D (CALCIUM 500/D) 500-200 MG-UNIT per tablet Take 1 tablet by mouth every evening.     Historical Provider, MD  carisoprodol (SOMA) 350 MG tablet Take 350 mg by mouth 4 (four) times daily.     Historical Provider, MD  carvedilol (COREG) 12.5 MG tablet Take 1 tablet (12.5 mg total) by mouth 2 (two) times daily with a meal. 11/13/14   Belva Crome, MD  Coenzyme Q10 (CO Q 10) 100 MG CAPS Take 1 tablet by mouth 2 (two) times daily.     Historical Provider, MD  CRESTOR 20 MG tablet TAKE 1 TABLET EVERY DAY 04/24/14   Belva Crome, MD  dicyclomine (BENTYL) 20 MG tablet Take one po BID 04/25/15   Jessica D Zehr, PA-C  fenofibrate 160 MG tablet Take 160 mg by mouth at bedtime.     Historical Provider, MD  furosemide (LASIX) 20 MG tablet Take 1 tablet (20 mg total) by mouth every morning. 11/13/14   Belva Crome, MD  gabapentin (NEURONTIN) 300 MG capsule Take 300 mg by mouth 3 (three) times daily.    Historical Provider, MD  insulin regular (NOVOLIN R,HUMULIN R) 100 units/mL injection Inject 40-50 units into the skin 3 times daily. Dx code; E13.9 05/07/15   Philemon Kingdom, MD  insulin regular human CONCENTRATED (HUMULIN R) 500 UNIT/ML injection  Inject 0.08-0.1 mLs (40-50 Units total) into the skin 3 (three) times daily with meals. Dx: E13.9 04/27/15   Philemon Kingdom, MD  levothyroxine (SYNTHROID, LEVOTHROID) 75 MCG tablet Take 75 mcg by mouth daily before breakfast.     Historical Provider, MD  losartan (COZAAR) 50 MG tablet Take 1 tablet by mouth daily. 01/29/15   Historical Provider, MD  metFORMIN (GLUCOPHAGE) 1000 MG tablet Take 1,000 mg by mouth 2 (two) times daily with a meal.    Historical Provider,  MD  metoCLOPramide (REGLAN) 10 MG tablet Take 10 mg by mouth 4 (four) times daily. Take every day per patient    Historical Provider, MD  montelukast (SINGULAIR) 10 MG tablet Take 10 mg by mouth at bedtime.    Historical Provider, MD  nitroGLYCERIN (NITROSTAT) 0.4 MG SL tablet Place 1 tablet (0.4 mg total) under the tongue every 5 (five) minutes as needed for chest pain. 10/29/13   Rhonda G Barrett, PA-C  omeprazole (PRILOSEC) 20 MG capsule Take 20 mg by mouth 2 (two) times daily before a meal.    Historical Provider, MD  oxyCODONE-acetaminophen (PERCOCET) 10-325 MG per tablet take 1 tablet by mouth three times a day if needed 05/07/15   Historical Provider, MD  polyethylene glycol (MIRALAX / GLYCOLAX) packet Take 17 g by mouth daily as needed for mild constipation.     Historical Provider, MD  topiramate (TOPAMAX) 50 MG tablet Take 50 mg by mouth 2 (two) times daily.    Historical Provider, MD  Vitamin D, Ergocalciferol, (DRISDOL) 50000 UNITS CAPS Take 50,000 Units by mouth every 7 (seven) days. Thursdays    Historical Provider, MD   BP 153/50 mmHg  Pulse 86  Temp(Src) 97.4 F (36.3 C) (Oral)  Resp 18  Ht 5\' 1"  (1.549 m)  Wt 216 lb (97.977 kg)  BMI 40.83 kg/m2  SpO2 98% Physical Exam  Constitutional: She is oriented to person, place, and time. She appears well-developed and well-nourished. No distress.  HENT:  Head: Normocephalic and atraumatic.  Nose: Nose normal.  Mouth/Throat: Oropharynx is clear and moist. No oropharyngeal  exudate.  Eyes: Conjunctivae and EOM are normal. Pupils are equal, round, and reactive to light. Right eye exhibits no discharge. Left eye exhibits no discharge. No scleral icterus.  Neck: Normal range of motion. No JVD present. No tracheal deviation present. No thyromegaly present.  Cardiovascular: Normal rate, regular rhythm, normal heart sounds and intact distal pulses.  Exam reveals no gallop and no friction rub.   No murmur heard. Pulmonary/Chest: Effort normal and breath sounds normal. No respiratory distress. She has no wheezes. She has no rales. She exhibits no tenderness.  Abdominal: Soft. Bowel sounds are normal. She exhibits no distension and no mass. There is no tenderness. There is no rebound and no guarding.  Abdomen soft, obese, nondistended, no tenderness to palpation, no rebound or guarding Left CVA tenderness  Musculoskeletal: Normal range of motion. She exhibits no edema or tenderness.  Lymphadenopathy:    She has no cervical adenopathy.  Neurological: She is alert and oriented to person, place, and time. She has normal reflexes. No cranial nerve deficit. She exhibits normal muscle tone. Coordination normal.  Skin: Skin is warm and dry. No rash noted. She is not diaphoretic. No erythema. No pallor.  Psychiatric: She has a normal mood and affect. Her behavior is normal. Judgment and thought content normal.  Nursing note and vitals reviewed.   ED Course  Procedures (including critical care time) Labs Review Labs Reviewed  URINALYSIS, ROUTINE W REFLEX MICROSCOPIC (NOT AT [facility]) - Abnormal; Notable for the following:    APPearance CLOUDY (*)    Glucose, UA 250 (*)    Hgb urine dipstick SMALL (*)    Nitrite POSITIVE (*)    Leukocytes, UA SMALL (*)    All other components within normal limits  BASIC METABOLIC PANEL - Abnormal; Notable for the following:    Sodium 134 (*)    Glucose, Bld 252 (*)    Creatinine, Ser 1.21 (*)  GFR calc non Af Amer 47 (*)    GFR calc Af  Amer 54 (*)    All other components within normal limits  CBC - Abnormal; Notable for the following:    WBC 11.7 (*)    Hemoglobin 11.3 (*)    RDW 16.8 (*)    All other components within normal limits  URINE MICROSCOPIC-ADD ON - Abnormal; Notable for the following:    Casts HYALINE CASTS (*)    All other components within normal limits  URINE CULTURE    Imaging Review Ct Renal Stone Study  06/16/2015   CLINICAL DATA:  Left flank pain since yesterday. Surgery's; c-section, tubal ligation, breast reduction Hx fatty liver, diverticulosis, CAD, gastroparesis  EXAM: CT ABDOMEN AND PELVIS WITHOUT CONTRAST  TECHNIQUE: Multidetector CT imaging of the abdomen and pelvis was performed following the standard protocol without IV contrast.  COMPARISON:  03/21/2015  FINDINGS: Visualized lung bases clear. Fatty liver without focal lesion. Unremarkable nondilated gallbladder, spleen, pancreas, adrenal glands. Unenhanced CT was performed per clinician order. Lack of IV contrast limits sensitivity and specificity, especially for evaluation of abdominal/pelvic solid viscera.  1 mm calculus in the lower pole right renal collecting system without hydronephrosis.  There is moderate left hydronephrosis and ureterectasis to the level of at least 2 1-2 mm calculi in the distal ureter at the level of the lower sacrum. There are streaky inflammatory/edematous changes in the retroperitoneum around the left kidney and proximal renal collecting system.  Stomach, small bowel, and colon are nondilated. Normal appendix. A few scattered sigmoid diverticula without adjacent inflammatory/ edematous change. Urinary bladder is incompletely distended. Bilateral tubal ligation clips. Uterus and adnexal regions otherwise unremarkable. Umbilical hernia containing only mesenteric fat. No ascites. No free air. No adenopathy localized. Moderate aortoiliac scattered calcifications without aneurysm. Lumbar spine and bony pelvis intact.   IMPRESSION: 1. Obstructing small distal left ureteral calculi with moderate hydronephrosis. 2. Moderate umbilical hernia containing only mesenteric fat. 3. Right nephrolithiasis without hydronephrosis. 4. Sigmoid diverticulosis. 5. Fatty liver   Electronically Signed   By: Lucrezia Europe M.D.   On: 06/16/2015 12:16   I have personally reviewed and evaluated these images and lab results as part of my medical decision-making.   EKG Interpretation None      MDM   Final diagnoses:  Left flank pain   Pt with left flank pain BASIC labs, urinalysis, CT renal stone  Renal stone study shows an obstructing small distal left ureteral calculi with moderate hydronephrosis, right nephrolithiasis without hydro.  There is evidence of streaky inflammatory or edematous changes in the retroperitoneum around left kidney. Labs show a mild leukocytosis, and urinalysis is consistent with UTI  Urology was consulted.  Dr. Carlota Raspberry did see the pt in the ED, he advises admission, IV abx, pain control, NPO after midnight, requested medicine admit.  [facility] called for admission. Dr. Eliseo Squires to admit to [facility], PA-C 06/16/15 [place], MD 06/17/15 [phone]

## 2015-06-16 NOTE — Progress Notes (Signed)
Pt's daughter brought in her insulin and syringe she uses from home. It is in her possession in room.  Spoke with Dr. Eliseo Squires who said to use Novolog sliding scale. Concentrated insulin order dc'd. Explained this to patient.

## 2015-06-16 NOTE — H&P (Signed)
[facility] History and Physical  [name] GYJ:[id number] DOB: 11-02-1950 DOA: 06/16/2015  Referring physician: er PCP: Marton Redwood, MD   Chief Complaint: abd pain  HPI: Danielle Vargas is a 64 y.o. female  With PMHx of DM, obesity and CAD.  She developed abd pain and nausea Wednesday which she attributed to gastroparesis.  Yesterday her pain got much worse and her left side started hurting more.  She lost her appetite and was unable to drink water which is abnormal for her.  She also had a lot of pressure when she urinates.   +chills, no fever She has had kidney stones in the past but this is much worse.   In the ER, CT scan showed obstructing small distal left ureteral calculi with hydronephrosis.  U/A was consistant with UTI.  She was seen by urology who recommends IV abx, IV Pain control and NPO after midnight in case a procedure is needed. Pain has improved in the ER with administration of dilaudid  Hospitalist were asked to admit   Review of Systems:  All systems reviewed, negative unless stated above   Past Medical History  Diagnosis Date  . DM (diabetes mellitus) ([place])   . Obesity   . Dyslipidemia   . HTN (hypertension)   . Hypothyroidism   . Gastroparesis   . Migraine   . Osteoporosis   . Colon polyps 7/07    hyperplastic and adenomatous  . Nephrolithiasis   . Diverticulosis   . External hemorrhoid   . Fatty liver   . Asthma   . CAD (coronary artery disease)     a. 12/2008 Cath: mild irregs throughout, EF 75%.  . 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 Allergy   . Cataract     "just beginning"  . Myocardial infarction ([place])     FEb 2015  . Depression     pt unsure, psychologist said no  . Sleep apnea     wears CPAP  . Hyperlipidemia    Past Surgical History  Procedure Laterality Date  . Tubal ligation  '85  . Breast reduction surgery  '88  . Cervical disc surgery  '98    fusion  . Carpal tunnel release  2000    right wrist  .  Rotator cuff repair  '09    left shoulder  . Ulnar nerve repair  '02, '08    left done, then right  . Cesarean section  '79  '84  . Left heart catheterization with coronary angiogram N/A 10/26/2013    Procedure: LEFT HEART CATHETERIZATION WITH CORONARY ANGIOGRAM;  Surgeon: Sinclair Grooms, MD;  Location: [facility] CATH LAB;  Service: Cardiovascular;  Laterality: N/A;  . Colonoscopy     Social History:  reports that she has never smoked. She has never used smokeless tobacco. She reports that she does not drink alcohol or use illicit drugs.  Allergies  Allergen Reactions  . Invokana [Canagliflozin] Diarrhea  . Nsaids Other (See Comments)    GI problems/ stomach pain  . Penicillins Rash    Has patient had a PCN reaction causing immediate rash, facial/tongue/throat swelling, SOB or lightheadedness with hypotension: No (rash on legs after 3 days [place] -June 2016) Has patient had a PCN reaction causing severe rash involving mucus membranes or skin necrosis: No Has patient had a PCN reaction that required hospitalization No Has patient had a PCN reaction occurring within the last 10 years: Yes If all of the above answers are "NO", then may  proceed with Cephalosporin use.    Family History  Problem Relation Age of Onset  . COPD Father   . Stroke Father   . Emphysema Father   . Heart failure Brother   . Heart failure Mother   . Heart attack Mother     several  . Diabetes Mother   . Kidney disease Mother   . Hypertension Brother   . Colon cancer      mat. 1st cousin  . Other      celiac sprue, 1/2 brother  . Diabetes Brother     x 3  . Lung cancer Brother   . Cirrhosis Sister     liver transplant  . Liver disease Sister     transplant  . Heart attack Paternal Grandfather   . Heart attack Brother   . Esophageal cancer Neg Hx   . Stomach cancer Neg Hx   . Rectal cancer Neg Hx     Prior to Admission medications   Medication Sig Start Date End Date Taking? Authorizing Provider    albuterol (PROVENTIL HFA;VENTOLIN HFA) 108 (90 BASE) MCG/ACT inhaler Inhale 2 puffs into the lungs every 6 (six) hours as needed. For shortness of breath.    Historical Provider, MD  aspirin EC 81 MG tablet Take 81 mg by mouth every morning.    Historical Provider, MD  calcium-vitamin D (CALCIUM 500/D) 500-200 MG-UNIT per tablet Take 1 tablet by mouth every evening.     Historical Provider, MD  carisoprodol (SOMA) 350 MG tablet Take 350 mg by mouth 4 (four) times daily.     Historical Provider, MD  carvedilol (COREG) 12.5 MG tablet Take 1 tablet (12.5 mg total) by mouth 2 (two) times daily with a meal. 11/13/14   Belva Crome, MD  Coenzyme Q10 (CO Q 10) 100 MG CAPS Take 1 tablet by mouth 2 (two) times daily.     Historical Provider, MD  CRESTOR 20 MG tablet TAKE 1 TABLET EVERY DAY 04/24/14   Belva Crome, MD  dicyclomine (BENTYL) 20 MG tablet Take one po BID 04/25/15   Shaneal Barasch D Zehr, PA-C  fenofibrate 160 MG tablet Take 160 mg by mouth at bedtime.     Historical Provider, MD  furosemide (LASIX) 20 MG tablet Take 1 tablet (20 mg total) by mouth every morning. 11/13/14   Belva Crome, MD  gabapentin (NEURONTIN) 300 MG capsule Take 300 mg by mouth 3 (three) times daily.    Historical Provider, MD  insulin regular (NOVOLIN R,HUMULIN R) 100 units/mL injection Inject 40-50 units into the skin 3 times daily. Dx code; E13.9 05/07/15   Philemon Kingdom, MD  insulin regular human CONCENTRATED (HUMULIN R) 500 UNIT/ML injection Inject 0.08-0.1 mLs (40-50 Units total) into the skin 3 (three) times daily with meals. Dx: E13.9 04/27/15   Philemon Kingdom, MD  levothyroxine (SYNTHROID, LEVOTHROID) 75 MCG tablet Take 75 mcg by mouth daily before breakfast.     Historical Provider, MD  losartan (COZAAR) 50 MG tablet Take 1 tablet by mouth daily. 01/29/15   Historical Provider, MD  metFORMIN (GLUCOPHAGE) 1000 MG tablet Take 1,000 mg by mouth 2 (two) times daily with a meal.    Historical Provider, MD  metoCLOPramide  (REGLAN) 10 MG tablet Take 10 mg by mouth 4 (four) times daily. Take every day per patient    Historical Provider, MD  montelukast (SINGULAIR) 10 MG tablet Take 10 mg by mouth at bedtime.    Historical Provider, MD  nitroGLYCERIN (  NITROSTAT) 0.4 MG SL tablet Place 1 tablet (0.4 mg total) under the tongue every 5 (five) minutes as needed for chest pain. 10/29/13   Rhonda G Barrett, PA-C  omeprazole (PRILOSEC) 20 MG capsule Take 20 mg by mouth 2 (two) times daily before a meal.    Historical Provider, MD  oxyCODONE-acetaminophen (PERCOCET) 10-325 MG per tablet take 1 tablet by mouth three times a day if needed 05/07/15   Historical Provider, MD  polyethylene glycol (MIRALAX / GLYCOLAX) packet Take 17 g by mouth daily as needed for mild constipation.     Historical Provider, MD  topiramate (TOPAMAX) 50 MG tablet Take 50 mg by mouth 2 (two) times daily.    Historical Provider, MD  Vitamin D, Ergocalciferol, (DRISDOL) 50000 UNITS CAPS Take 50,000 Units by mouth every 7 (seven) days. Thursdays    Historical Provider, MD   Physical Exam: Filed Vitals:   06/16/15 1130 06/16/15 1212 06/16/15 1303 06/16/15 1428  BP: 179/60 174/60 188/52 153/50  Pulse: 99 98  86  Temp:      TempSrc:      Resp:  18 16 18   Height:      Weight:      SpO2: 97% 98% 97% 98%    Wt Readings from Last 3 Encounters:  06/16/15 97.977 kg (216 lb)  05/16/15 99.791 kg (220 lb)  05/11/15 98.884 kg (218 lb)    General:  Appears calm and comfortable Eyes: PERRL, normal lids, irises & conjunctiva ENT: grossly normal hearing, lips & tongue Neck: no LAD, masses or thyromegaly Cardiovascular: RRR, no m/r/g. No LE edema. Telemetry: SR, no arrhythmias  Respiratory: CTA bilaterally, no w/r/r. Normal respiratory effort. Abdomen: soft, ntnd Skin: no rash or induration seen on limited exam Musculoskeletal: grossly normal tone BUE/BLE Psychiatric: grossly normal mood and affect, speech fluent and appropriate Neurologic: grossly  non-focal.          Labs on Admission:  Basic Metabolic Panel:  Recent Labs Lab 06/16/15 1145  NA 134*  K 4.5  CL 102  CO2 23  GLUCOSE 252*  BUN 11  CREATININE 1.21*  CALCIUM 9.6   Liver Function Tests: No results for input(s): AST, ALT, ALKPHOS, BILITOT, PROT, ALBUMIN in the last 168 hours. No results for input(s): LIPASE, AMYLASE in the last 168 hours. No results for input(s): AMMONIA in the last 168 hours. CBC:  Recent Labs Lab 06/16/15 1145  WBC 11.7*  HGB 11.3*  HCT 36.8  MCV 86.4  PLT 280   Cardiac Enzymes: No results for input(s): CKTOTAL, CKMB, CKMBINDEX, TROPONINI in the last 168 hours.  BNP (last 3 results) No results for input(s): BNP in the last 8760 hours.  ProBNP (last 3 results) No results for input(s): PROBNP in the last 8760 hours.  CBG: No results for input(s): GLUCAP in the last 168 hours.  Radiological Exams on Admission: Ct Renal Stone Study  06/16/2015   CLINICAL DATA:  Left flank pain since yesterday. Surgery's; c-section, tubal ligation, breast reduction Hx fatty liver, diverticulosis, CAD, gastroparesis  EXAM: CT ABDOMEN AND PELVIS WITHOUT CONTRAST  TECHNIQUE: Multidetector CT imaging of the abdomen and pelvis was performed following the standard protocol without IV contrast.  COMPARISON:  03/21/2015  FINDINGS: Visualized lung bases clear. Fatty liver without focal lesion. Unremarkable nondilated gallbladder, spleen, pancreas, adrenal glands. Unenhanced CT was performed per clinician order. Lack of IV contrast limits sensitivity and specificity, especially for evaluation of abdominal/pelvic solid viscera.  1 mm calculus in the lower pole right  renal collecting system without hydronephrosis.  There is moderate left hydronephrosis and ureterectasis to the level of at least 2 1-2 mm calculi in the distal ureter at the level of the lower sacrum. There are streaky inflammatory/edematous changes in the retroperitoneum around the left kidney and  proximal renal collecting system.  Stomach, small bowel, and colon are nondilated. Normal appendix. A few scattered sigmoid diverticula without adjacent inflammatory/ edematous change. Urinary bladder is incompletely distended. Bilateral tubal ligation clips. Uterus and adnexal regions otherwise unremarkable. Umbilical hernia containing only mesenteric fat. No ascites. No free air. No adenopathy localized. Moderate aortoiliac scattered calcifications without aneurysm. Lumbar spine and bony pelvis intact.  IMPRESSION: 1. Obstructing small distal left ureteral calculi with moderate hydronephrosis. 2. Moderate umbilical hernia containing only mesenteric fat. 3. Right nephrolithiasis without hydronephrosis. 4. Sigmoid diverticulosis. 5. Fatty liver   Electronically Signed   By: Lucrezia Europe M.D.   On: 06/16/2015 12:16      Assessment/Plan Active Problems:   Neuropathy due to secondary diabetes mellitus ([place])   UTI (urinary tract infection)   Diabetes mellitus ([place])   UTI with small distal left ureteral calculi with moderate hydronephrosis -IVF -urine culture NPO after midnight in case of needed procedure -urology consult  Leukocytosis -trend  DM -SSI -on U500 at home -may need to change to lantus-- if patient does not have insulin-- will ask pharmacy conversion  Neuropathy   Urology consult   Code Status: full DVT Prophylaxis: Family Communication: patient/daughter Disposition Plan:   Time spent: 49 min  Eulogio Bear [facility] Pager [phone]

## 2015-06-16 NOTE — Consult Note (Signed)
Urology Consult   Physician requesting consult: Leonard Schwartz, MD   Reason for consult: Left UVJ stones  History of Present Illness: Danielle Vargas is a 64 y.o. with multiple medical problems including MI in 2015, DM, OSA and previous renal stones. She had onset of left flank pain approximately 2 days ago. The pain worsened overnight and she lost her appetite without n/v. The patient then came to the ED where a CT showed 2 small 1-2 mm stones in the left UVJ with hydronephrosis. The patient has a normal white count and is afebrile both in the ED and at home. The patient did have nitrite positive urine but does not complain of any dysuria or frequency.     Past Medical History  Diagnosis Date  . DM (diabetes mellitus) ([place])   . Obesity   . Dyslipidemia   . HTN (hypertension)   . Hypothyroidism   . Gastroparesis   . Migraine   . Osteoporosis   . Colon polyps 7/07    hyperplastic and adenomatous  . Nephrolithiasis   . Diverticulosis   . External hemorrhoid   . Fatty liver   . Asthma   . CAD (coronary artery disease)     a. 12/2008 Cath: mild irregs throughout, EF 75%.  . 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 Allergy   . Cataract     "just beginning"  . Myocardial infarction ([place])     FEb 2015  . Depression     pt unsure, psychologist said no  . Sleep apnea     wears CPAP  . Hyperlipidemia     Past Surgical History  Procedure Laterality Date  . Tubal ligation  '85  . Breast reduction surgery  '88  . Cervical disc surgery  '98    fusion  . Carpal tunnel release  2000    right wrist  . Rotator cuff repair  '09    left shoulder  . Ulnar nerve repair  '02, '08    left done, then right  . Cesarean section  '79  '84  . Left heart catheterization with coronary angiogram N/A 10/26/2013    Procedure: LEFT HEART CATHETERIZATION WITH CORONARY ANGIOGRAM;  Surgeon: Sinclair Grooms, MD;  Location: [facility] CATH LAB;  Service: Cardiovascular;  Laterality: N/A;  . Colonoscopy        Current Hospital Medications:  Home meds:    Medication List    ASK your doctor about these medications        albuterol 108 (90 BASE) MCG/ACT inhaler  Commonly known as:  PROVENTIL HFA;VENTOLIN HFA  Inhale 2 puffs into the lungs every 6 (six) hours as needed. For shortness of breath.     aspirin EC 81 MG tablet  Take 81 mg by mouth every morning.     CALCIUM 500/D 500-200 MG-UNIT tablet  Generic drug:  calcium-vitamin D  Take 1 tablet by mouth every evening.     carisoprodol 350 MG tablet  Commonly known as:  SOMA  Take 350 mg by mouth 4 (four) times daily.     carvedilol 12.5 MG tablet  Commonly known as:  COREG  Take 1 tablet (12.5 mg total) by mouth 2 (two) times daily with a meal.     Co Q 10 100 MG Caps  Take 1 tablet by mouth 2 (two) times daily.     CRESTOR 20 MG tablet  Generic drug:  rosuvastatin  TAKE 1 TABLET EVERY DAY     dicyclomine 20  MG tablet  Commonly known as:  BENTYL  Take one po BID     fenofibrate 160 MG tablet  Take 160 mg by mouth at bedtime.     furosemide 20 MG tablet  Commonly known as:  LASIX  Take 1 tablet (20 mg total) by mouth every morning.     gabapentin 300 MG capsule  Commonly known as:  NEURONTIN  Take 300 mg by mouth 3 (three) times daily.     insulin regular human CONCENTRATED 500 UNIT/ML injection  Commonly known as:  HUMULIN R  Inject 0.08-0.1 mLs (40-50 Units total) into the skin 3 (three) times daily with meals. Dx: E13.9     insulin regular 100 units/mL injection  Commonly known as:  NOVOLIN R,HUMULIN R  Inject 40-50 units into the skin 3 times daily. Dx code; E13.9     levothyroxine 75 MCG tablet  Commonly known as:  SYNTHROID, LEVOTHROID  Take 75 mcg by mouth daily before breakfast.     losartan 50 MG tablet  Commonly known as:  COZAAR  Take 1 tablet by mouth daily.     metFORMIN 1000 MG tablet  Commonly known as:  GLUCOPHAGE  Take 1,000 mg by mouth 2 (two) times daily with a meal.      metoCLOPramide 10 MG tablet  Commonly known as:  REGLAN  Take 10 mg by mouth 4 (four) times daily. Take every day per patient     montelukast 10 MG tablet  Commonly known as:  SINGULAIR  Take 10 mg by mouth at bedtime.     nitroGLYCERIN 0.4 MG SL tablet  Commonly known as:  NITROSTAT  Place 1 tablet (0.4 mg total) under the tongue every 5 (five) minutes as needed for chest pain.     omeprazole 20 MG capsule  Commonly known as:  PRILOSEC  Take 20 mg by mouth 2 (two) times daily before a meal.     oxyCODONE-acetaminophen 10-325 MG tablet  Commonly known as:  PERCOCET  take 1 tablet by mouth three times a day if needed     polyethylene glycol packet  Commonly known as:  MIRALAX / GLYCOLAX  Take 17 g by mouth daily as needed for mild constipation.     topiramate 50 MG tablet  Commonly known as:  TOPAMAX  Take 50 mg by mouth 2 (two) times daily.     Vitamin D (Ergocalciferol) 50000 UNITS Caps capsule  Commonly known as:  DRISDOL  Take 50,000 Units by mouth every 7 (seven) days. Thursdays        Scheduled Meds:  Continuous Infusions: . cefTRIAXone (ROCEPHIN)  IV     PRN Meds:.HYDROmorphone (DILAUDID) injection  Allergies:  Allergies  Allergen Reactions  . Invokana [Canagliflozin] Diarrhea  . Nsaids Other (See Comments)    GI problems  . Penicillins Rash    Family History  Problem Relation Age of Onset  . COPD Father   . Stroke Father   . Emphysema Father   . Heart failure Brother   . Heart failure Mother   . Heart attack Mother     several  . Diabetes Mother   . Kidney disease Mother   . Hypertension Brother   . Colon cancer      mat. 1st cousin  . Other      celiac sprue, 1/2 brother  . Diabetes Brother     x 3  . Lung cancer Brother   . Cirrhosis Sister     liver transplant  .  Liver disease Sister     transplant  . Heart attack Paternal Grandfather   . Heart attack Brother   . Esophageal cancer Neg Hx   . Stomach cancer Neg Hx   . Rectal cancer  Neg Hx     Social History:  reports that she has never smoked. She has never used smokeless tobacco. She reports that she does not drink alcohol or use illicit drugs.  ROS: A complete review of systems was performed.  All systems are negative except for pertinent findings as noted.  Physical Exam:  Vital signs in last 24 hours: Temp:  [97.4 F (36.3 C)] 97.4 F (36.3 C) (10/08 1124) Pulse Rate:  [86-101] 86 (10/08 1428) Resp:  [16-22] 18 (10/08 1428) BP: (153-188)/(50-89) 153/50 mmHg (10/08 1428) SpO2:  [97 %-99 %] 98 % (10/08 1428) Weight:  [97.977 kg (216 lb)] 97.977 kg (216 lb) (10/08 1127) Constitutional:  Alert and oriented, No acute distress Cardiovascular: Regular rate and rhythm, No JVD Respiratory: Normal respiratory effort, Lungs clear bilaterally GI: Abdomen is soft, nontender, nondistended, no abdominal masses GU: Left CVA tenderness Lymphatic: No lymphadenopathy Neurologic: Grossly intact, no focal deficits Psychiatric: Normal mood and affect  Laboratory Data:   Recent Labs  06/16/15 1145  WBC 11.7*  HGB 11.3*  HCT 36.8  PLT 280     Recent Labs  06/16/15 1145  NA 134*  K 4.5  CL 102  GLUCOSE 252*  BUN 11  CALCIUM 9.6  CREATININE 1.21*     Results for orders placed or performed during the hospital encounter of 06/16/15 (from the past 24 hour(s))  Urinalysis, Routine w reflex microscopic-may I&O cath if menses (not at [facility])     Status: Abnormal   Collection Time: 06/16/15 11:34 AM  Result Value Ref Range   Color, Urine YELLOW YELLOW   APPearance CLOUDY (A) CLEAR   Specific Gravity, Urine 1.017 1.005 - 1.030   pH 7.0 5.0 - 8.0   Glucose, UA 250 (A) NEGATIVE mg/dL   Hgb urine dipstick SMALL (A) NEGATIVE   Bilirubin Urine NEGATIVE NEGATIVE   Ketones, ur NEGATIVE NEGATIVE mg/dL   Protein, ur NEGATIVE NEGATIVE mg/dL   Urobilinogen, UA 0.2 0.0 - 1.0 mg/dL   Nitrite POSITIVE (A) NEGATIVE   Leukocytes, UA SMALL (A) NEGATIVE  Urine microscopic-add  on     Status: Abnormal   Collection Time: 06/16/15 11:34 AM  Result Value Ref Range   Squamous Epithelial / LPF RARE RARE   WBC, UA 11-20 <3 WBC/hpf   RBC / HPF 3-6 <3 RBC/hpf   Bacteria, UA RARE RARE   Casts HYALINE CASTS (A) NEGATIVE  Basic metabolic panel     Status: Abnormal   Collection Time: 06/16/15 11:45 AM  Result Value Ref Range   Sodium 134 (L) 135 - 145 mmol/L   Potassium 4.5 3.5 - 5.1 mmol/L   Chloride 102 101 - 111 mmol/L   CO2 23 22 - 32 mmol/L   Glucose, Bld 252 (H) 65 - 99 mg/dL   BUN 11 6 - 20 mg/dL   Creatinine, Ser 1.21 (H) 0.44 - 1.00 mg/dL   Calcium 9.6 8.9 - 10.3 mg/dL   GFR calc non Af Amer 47 (L) >60 mL/min   GFR calc Af Amer 54 (L) >60 mL/min   Anion gap 9 5 - 15  CBC     Status: Abnormal   Collection Time: 06/16/15 11:45 AM  Result Value Ref Range   WBC 11.7 (H) 4.0 -  10.5 K/uL   RBC 4.26 3.87 - 5.11 MIL/uL   Hemoglobin 11.3 (L) 12.0 - 15.0 g/dL   HCT 36.8 36.0 - 46.0 %   MCV 86.4 78.0 - 100.0 fL   MCH 26.5 26.0 - 34.0 pg   MCHC 30.7 30.0 - 36.0 g/dL   RDW 16.8 (H) 11.5 - 15.5 %   Platelets 280 150 - 400 K/uL   No results found for this or any previous visit (from the past 240 hour(s)).  Renal Function:  Recent Labs  06/16/15 1145  CREATININE 1.21*   Estimated Creatinine Clearance: 51.7 mL/min (by C-G formula based on Cr of 1.21).  Radiologic Imaging: Ct Renal Stone Study  06/16/2015   CLINICAL DATA:  Left flank pain since yesterday. Surgery's; c-section, tubal ligation, breast reduction Hx fatty liver, diverticulosis, CAD, gastroparesis  EXAM: CT ABDOMEN AND PELVIS WITHOUT CONTRAST  TECHNIQUE: Multidetector CT imaging of the abdomen and pelvis was performed following the standard protocol without IV contrast.  COMPARISON:  03/21/2015  FINDINGS: Visualized lung bases clear. Fatty liver without focal lesion. Unremarkable nondilated gallbladder, spleen, pancreas, adrenal glands. Unenhanced CT was performed per clinician order. Lack of IV  contrast limits sensitivity and specificity, especially for evaluation of abdominal/pelvic solid viscera.  1 mm calculus in the lower pole right renal collecting system without hydronephrosis.  There is moderate left hydronephrosis and ureterectasis to the level of at least 2 1-2 mm calculi in the distal ureter at the level of the lower sacrum. There are streaky inflammatory/edematous changes in the retroperitoneum around the left kidney and proximal renal collecting system.  Stomach, small bowel, and colon are nondilated. Normal appendix. A few scattered sigmoid diverticula without adjacent inflammatory/ edematous change. Urinary bladder is incompletely distended. Bilateral tubal ligation clips. Uterus and adnexal regions otherwise unremarkable. Umbilical hernia containing only mesenteric fat. No ascites. No free air. No adenopathy localized. Moderate aortoiliac scattered calcifications without aneurysm. Lumbar spine and bony pelvis intact.  IMPRESSION: 1. Obstructing small distal left ureteral calculi with moderate hydronephrosis. 2. Moderate umbilical hernia containing only mesenteric fat. 3. Right nephrolithiasis without hydronephrosis. 4. Sigmoid diverticulosis. 5. Fatty liver   Electronically Signed   By: Lucrezia Europe M.D.   On: 06/16/2015 12:16    I independently reviewed the above imaging studies.  Impression/Recommendation: 64 y.o. female with 2 small left uvj stones about 1-2 mm. The patient has a solitary finding of nitrite positive urine with 10-20 WBCs in her urine concerning for infection. Given her clinical picture and serum white count I have a low suspicion for infectious stone. The patient has passed all previous stones without intervention and will likely pass these as well. The patient would like to avoid a stent if possible. Given her multiple co-morbidities and the fact that she lives alone I think it would be prudent to optimize her and monitor for signs of sepsis. The patient understands  that if she deteriorates she will need a stent.   - Monitor for signs of worsening infection - NPO after midnight in case she deteriorates and needs a stent in the morning - We will monitor disease progression and if stable we may avoid a stent. - Empiric treatment of nitrite positive urine is appropriate and should be followed with urine cultures.   Christell Faith 06/16/2015, 2:45 PM

## 2015-06-16 NOTE — ED Notes (Signed)
Hx of kidney stones and gastroperesis.  Left sided lower back pain onset yesterday, hurting all day. C/o dysuria as well.  States pain in back is extreme. Hypertensive during triage 191/75

## 2015-06-17 DIAGNOSIS — I10 Essential (primary) hypertension: Secondary | ICD-10-CM

## 2015-06-17 DIAGNOSIS — E1165 Type 2 diabetes mellitus with hyperglycemia: Secondary | ICD-10-CM

## 2015-06-17 DIAGNOSIS — Z794 Long term (current) use of insulin: Secondary | ICD-10-CM

## 2015-06-17 DIAGNOSIS — E039 Hypothyroidism, unspecified: Secondary | ICD-10-CM

## 2015-06-17 DIAGNOSIS — N3001 Acute cystitis with hematuria: Secondary | ICD-10-CM

## 2015-06-17 DIAGNOSIS — E1340 Other specified diabetes mellitus with diabetic neuropathy, unspecified: Secondary | ICD-10-CM

## 2015-06-17 DIAGNOSIS — D72829 Elevated white blood cell count, unspecified: Secondary | ICD-10-CM

## 2015-06-17 LAB — BASIC METABOLIC PANEL
ANION GAP: 9 (ref 5–15)
BUN: 12 mg/dL (ref 6–20)
CALCIUM: 8.9 mg/dL (ref 8.9–10.3)
CO2: 23 mmol/L (ref 22–32)
Chloride: 101 mmol/L (ref 101–111)
Creatinine, Ser: 1.09 mg/dL — ABNORMAL HIGH (ref 0.44–1.00)
GFR calc Af Amer: >60 mL/min (ref >60)
GFR calc non Af Amer: 53 mL/min — ABNORMAL LOW (ref >60)
GLUCOSE: 216 mg/dL — AB (ref 65–99)
Potassium: 4.3 mmol/L (ref 3.5–5.1)
Sodium: 133 mmol/L — ABNORMAL LOW (ref 135–145)

## 2015-06-17 LAB — CBC
HEMATOCRIT: 34.2 % — AB (ref 36.0–46.0)
HEMOGLOBIN: 10.4 g/dL — AB (ref 12.0–15.0)
MCH: 26.4 pg (ref 26.0–34.0)
MCHC: 30.4 g/dL (ref 30.0–36.0)
MCV: 86.8 fL (ref 78.0–100.0)
Platelets: 246 10*3/uL (ref 150–400)
RBC: 3.94 MIL/uL (ref 3.87–5.11)
RDW: 16.9 % — ABNORMAL HIGH (ref 11.5–15.5)
WBC: 6.1 10*3/uL (ref 4.0–10.5)

## 2015-06-17 LAB — GLUCOSE, CAPILLARY: GLUCOSE-CAPILLARY: 210 mg/dL — AB (ref 65–99)

## 2015-06-17 MED ORDER — CEFUROXIME AXETIL 500 MG PO TABS: 500.0000 mg | ORAL_TABLET | Freq: Two times a day (BID) | ORAL | Status: DC

## 2015-06-17 NOTE — Discharge Summary (Signed)
Physician Discharge Summary  Amrita Radu RXV:[id number] DOB: 07-27-1951 DOA: 06/16/2015  PCP: Marton Redwood, MD  Admit date: 06/16/2015 Discharge date: 06/17/2015  Time spent: 45 minutes  Recommendations for Outpatient Follow-up:  Patient will be discharged to home.  Patient will need to follow up with primary care provider within one week of discharge.  Patient will also need to follow up with Dr. Junious Silk in 2 weeks. Patient should continue medications as prescribed.  Patient should follow a Heart healthy/carb modified diet.   Discharge Diagnoses:  Left UPJ stone with hydronephrosis UTI Leukocytosis Diabetes mellitus with neuropathy Hypothyroidism Hypertension  Discharge Condition: Stable   Diet recommendation: Heart healthy/carb modified  Filed Weights   06/16/15 1127  Weight: 97.977 kg (216 lb)    History of present illness:  On 06/16/2015 by Dr. Eulogio Bear Libra Gatz is a 64 y.o. female  With PMHx of DM, obesity and CAD. She developed abd pain and nausea Wednesday which she attributed to gastroparesis. Yesterday her pain got much worse and her left side started hurting more. She lost her appetite and was unable to drink water which is abnormal for her. She also had a lot of pressure when she urinates. +chills, no fever. She has had kidney stones in the past but this is much worse. In the ER, CT scan showed obstructing small distal left ureteral calculi with hydronephrosis. U/A was consistant with UTI. She was seen by urology who recommends IV abx, IV Pain control and NPO after midnight in case a procedure is needed. Pain has improved in the ER with administration of dilaudid  Hospital Course:  Left (UPJ) Ureteral Calculi with hydronephrosis -Seen on CT scan, obstructing -Urology Consulted and appreciated, recommended 7 days of antibiotics -Patient's symptoms have improved, urology feels patient may have passed stone  UTI -Urine culture pending -Patient  started on ceftriaxone -Will discharge with Ceftin  Leukocytosis -Resolved likely secondary to above processes  Diabetes mellitus with neuropathy -Continue home regimen -Continue gabapentin for neuropathy   Hypothyroidism -Continue synthroid  Hypertension -Continue Coreg  Procedures: None  Consultations: Urology  Discharge Exam: Filed Vitals:   06/17/15 0537  BP: 160/72  Pulse: 92  Temp: 98.3 F (36.8 C)  Resp: 19     General: Well developed, well nourished, NAD  HEENT: NCAT, mucous membranes moist.  Cardiovascular: S1 S2 auscultated, no rubs, murmurs or gallops. Regular rate and rhythm.  Respiratory: Clear to auscultation bilaterally   Abdomen: Soft, obese, nontender, nondistended, + bowel sounds  Genitourinary: no CVA tenderness   Extremities: warm dry without cyanosis clubbing or edema  Neuro: AAOx3, nonfocal  Discharge Instructions      Discharge Instructions    Discharge instructions    Complete by:  As directed   Patient will be discharged to home.  Patient will need to follow up with primary care provider within one week of discharge.  Patient should continue medications as prescribed.  Patient should follow a Heart healthy/carb modified diet.            Medication List    STOP taking these medications        CITRACAL + D PO     Vitamin D 2000 UNITS tablet      TAKE these medications        albuterol 108 (90 BASE) MCG/ACT inhaler  Commonly known as:  PROVENTIL HFA;VENTOLIN HFA  Inhale 2 puffs into the lungs every 6 (six) hours as needed for wheezing or shortness of breath.  aspirin EC 81 MG tablet  Take 81 mg by mouth daily.     budesonide-formoterol 160-4.5 MCG/ACT inhaler  Commonly known as:  SYMBICORT  Inhale 2 puffs into the lungs 2 (two) times daily as needed (shortness of breath).     carisoprodol 350 MG tablet  Commonly known as:  SOMA  Take 350 mg by mouth 4 (four) times daily.     carvedilol 12.5 MG tablet    Commonly known as:  COREG  Take 1 tablet (12.5 mg total) by mouth 2 (two) times daily with a meal.     Co Q 10 100 MG Caps  Take 100 mg by mouth 2 (two) times daily.     CRESTOR 20 MG tablet  Generic drug:  rosuvastatin  TAKE 1 TABLET EVERY DAY     dicyclomine 20 MG tablet  Commonly known as:  BENTYL  Take one po BID     fenofibrate 160 MG tablet  Take 160 mg by mouth at bedtime.     furosemide 20 MG tablet  Commonly known as:  LASIX  Take 1 tablet (20 mg total) by mouth every morning.     gabapentin 300 MG capsule  Commonly known as:  NEURONTIN  Take 300 mg by mouth 3 (three) times daily.     HYDROcodone-acetaminophen 10-325 MG tablet  Commonly known as:  NORCO  Take 1 tablet by mouth 3 (three) times daily as needed (pain).     insulin regular human CONCENTRATED 500 UNIT/ML injection  Commonly known as:  HUMULIN R  Inject 0.08-0.1 mLs (40-50 Units total) into the skin 3 (three) times daily with meals. Dx: E13.9     levothyroxine 75 MCG tablet  Commonly known as:  SYNTHROID, LEVOTHROID  Take 75 mcg by mouth daily.     losartan 50 MG tablet  Commonly known as:  COZAAR  Take 50 mg by mouth 2 (two) times daily.     metFORMIN 1000 MG tablet  Commonly known as:  GLUCOPHAGE  Take 1,000 mg by mouth 2 (two) times daily with a meal.     metoCLOPramide 10 MG tablet  Commonly known as:  REGLAN  Take 10 mg by mouth 4 (four) times daily.     montelukast 10 MG tablet  Commonly known as:  SINGULAIR  Take 10 mg by mouth at bedtime.     multivitamin with minerals Tabs tablet  Take 1 tablet by mouth daily. One A Day 50+     nitroGLYCERIN 0.4 MG SL tablet  Commonly known as:  NITROSTAT  Place 1 tablet (0.4 mg total) under the tongue every 5 (five) minutes as needed for chest pain.     omeprazole 20 MG capsule  Commonly known as:  PRILOSEC  Take 20 mg by mouth 2 (two) times daily before a meal.     polyethylene glycol packet  Commonly known as:  MIRALAX / GLYCOLAX   Take 17 g by mouth every other day.     topiramate 50 MG tablet  Commonly known as:  TOPAMAX  Take 50 mg by mouth 2 (two) times daily.     Vitamin D (Ergocalciferol) 50000 UNITS Caps capsule  Commonly known as:  DRISDOL  Take 50,000 Units by mouth every 7 (seven) days. Thursdays       Allergies  Allergen Reactions  . Invokana [Canagliflozin] Diarrhea  . Nsaids Other (See Comments)    GI problems/ stomach pain  . Penicillins Rash    Has patient had a  PCN reaction causing immediate rash, facial/tongue/throat swelling, SOB or lightheadedness with hypotension: No (rash on legs after 3 days [place] -June 2016) Has patient had a PCN reaction causing severe rash involving mucus membranes or skin necrosis: No Has patient had a PCN reaction that required hospitalization No Has patient had a PCN reaction occurring within the last 10 years: Yes If all of the above answers are "NO", then may proceed with Cephalosporin use.   Follow-up Information    Follow up with ESKRIDGE, MATTHEW, MD. Schedule an appointment as soon as possible for a visit in 2 weeks.   Specialty:  Urology   Why:  with renal ultrasound   Contact information:   [place] [id number] [phone]       Follow up with Marton Redwood, MD. Schedule an appointment as soon as possible for a visit in 1 week.   Specialty:  Internal Medicine   Why:  Hospital follow up   Contact information:   [address] [place] [address] [phone]        The results of significant diagnostics from this hospitalization (including imaging, microbiology, ancillary and laboratory) are listed below for reference.    Significant Diagnostic Studies: Ct Renal Stone Study  06/16/2015   CLINICAL DATA:  Left flank pain since yesterday. Surgery's; c-section, tubal ligation, breast reduction Hx fatty liver, diverticulosis, CAD, gastroparesis  EXAM: CT ABDOMEN AND PELVIS WITHOUT CONTRAST  TECHNIQUE: Multidetector CT imaging of  the abdomen and pelvis was performed following the standard protocol without IV contrast.  COMPARISON:  03/21/2015  FINDINGS: Visualized lung bases clear. Fatty liver without focal lesion. Unremarkable nondilated gallbladder, spleen, pancreas, adrenal glands. Unenhanced CT was performed per clinician order. Lack of IV contrast limits sensitivity and specificity, especially for evaluation of abdominal/pelvic solid viscera.  1 mm calculus in the lower pole right renal collecting system without hydronephrosis.  There is moderate left hydronephrosis and ureterectasis to the level of at least 2 1-2 mm calculi in the distal ureter at the level of the lower sacrum. There are streaky inflammatory/edematous changes in the retroperitoneum around the left kidney and proximal renal collecting system.  Stomach, small bowel, and colon are nondilated. Normal appendix. A few scattered sigmoid diverticula without adjacent inflammatory/ edematous change. Urinary bladder is incompletely distended. Bilateral tubal ligation clips. Uterus and adnexal regions otherwise unremarkable. Umbilical hernia containing only mesenteric fat. No ascites. No free air. No adenopathy localized. Moderate aortoiliac scattered calcifications without aneurysm. Lumbar spine and bony pelvis intact.  IMPRESSION: 1. Obstructing small distal left ureteral calculi with moderate hydronephrosis. 2. Moderate umbilical hernia containing only mesenteric fat. 3. Right nephrolithiasis without hydronephrosis. 4. Sigmoid diverticulosis. 5. Fatty liver   Electronically Signed   By: Lucrezia Europe M.D.   On: 06/16/2015 12:16    Microbiology: No results found for this or any previous visit (from the past 240 hour(s)).   Labs: Basic Metabolic Panel:  Recent Labs Lab 06/16/15 1145 06/17/15 0740  NA 134* 133*  K 4.5 4.3  CL 102 101  CO2 23 23  GLUCOSE 252* 216*  BUN 11 12  CREATININE 1.21* 1.09*  CALCIUM 9.6 8.9   Liver Function Tests: No results for  input(s): AST, ALT, ALKPHOS, BILITOT, PROT, ALBUMIN in the last 168 hours. No results for input(s): LIPASE, AMYLASE in the last 168 hours. No results for input(s): AMMONIA in the last 168 hours. CBC:  Recent Labs Lab 06/16/15 1145 06/17/15 0740  WBC 11.7* 6.1  HGB 11.3*  10.4*  HCT 36.8 34.2*  MCV 86.4 86.8  PLT 280 246   Cardiac Enzymes: No results for input(s): CKTOTAL, CKMB, CKMBINDEX, TROPONINI in the last 168 hours. BNP: BNP (last 3 results) No results for input(s): BNP in the last 8760 hours.  ProBNP (last 3 results) No results for input(s): PROBNP in the last 8760 hours.  CBG:  Recent Labs Lab 06/16/15 1730 06/16/15 2120 06/17/15 0759  GLUCAP 185* 205* 210*       Signed:  Doy Taaffe  [facility] 06/17/2015, 10:09 AM

## 2015-06-17 NOTE — Discharge Instructions (Signed)
Urinary Tract Infection Urinary tract infections (UTIs) can develop anywhere along your urinary tract. Your urinary tract is your body's drainage system for removing wastes and extra water. Your urinary tract includes two kidneys, two ureters, a bladder, and a urethra. Your kidneys are a pair of bean-shaped organs. Each kidney is about the size of your fist. They are located below your ribs, one on each side of your spine. CAUSES Infections are caused by microbes, which are microscopic organisms, including fungi, viruses, and bacteria. These organisms are so small that they can only be seen through a microscope. Bacteria are the microbes that most commonly cause UTIs. SYMPTOMS  Symptoms of UTIs may vary by age and gender of the patient and by the location of the infection. Symptoms in young women typically include a frequent and intense urge to urinate and a painful, burning feeling in the bladder or urethra during urination. Older women and men are more likely to be tired, shaky, and weak and have muscle aches and abdominal pain. A fever may mean the infection is in your kidneys. Other symptoms of a kidney infection include pain in your back or sides below the ribs, nausea, and vomiting. DIAGNOSIS To diagnose a UTI, your caregiver will ask you about your symptoms. Your caregiver will also ask you to provide a urine sample. The urine sample will be tested for bacteria and white blood cells. White blood cells are made by your body to help fight infection. TREATMENT  Typically, UTIs can be treated with medication. Because most UTIs are caused by a bacterial infection, they usually can be treated with the use of antibiotics. The choice of antibiotic and length of treatment depend on your symptoms and the type of bacteria causing your infection. HOME CARE INSTRUCTIONS  If you were prescribed antibiotics, take them exactly as your caregiver instructs you. Finish the medication even if you feel better after  you have only taken some of the medication.  Drink enough water and fluids to keep your urine clear or pale yellow.  Avoid caffeine, tea, and carbonated beverages. They tend to irritate your bladder.  Empty your bladder often. Avoid holding urine for long periods of time.  Empty your bladder before and after sexual intercourse.  After a bowel movement, women should cleanse from front to back. Use each tissue only once. SEEK MEDICAL CARE IF:   You have back pain.  You develop a fever.  Your symptoms do not begin to resolve within 3 days. SEEK IMMEDIATE MEDICAL CARE IF:   You have severe back pain or lower abdominal pain.  You develop chills.  You have nausea or vomiting.  You have continued burning or discomfort with urination. MAKE SURE YOU:   Understand these instructions.  Will watch your condition.  Will get help right away if you are not doing well or get worse.   This information is not intended to replace advice given to you by your health care provider. Make sure you discuss any questions you have with your health care provider.   Document Released: 06/04/2005 Document Revised: 05/16/2015 Document Reviewed: 10/03/2011 Elsevier Interactive Patient Education 2016 Else[employer] Kidney Stones Kidney stones (urolithiasis) are deposits that form inside your kidneys. The intense pain is caused by the stone moving through the urinary tract. When the stone moves, the ureter goes into spasm around the stone. The stone is usually passed in the urine.  CAUSES   A disorder that makes certain neck glands produce too much parathyroid hormone (  primary hyperparathyroidism).  A buildup of uric acid crystals, similar to gout in your joints.  Narrowing (stricture) of the ureter.  A kidney obstruction present at birth (congenital obstruction).  Previous surgery on the kidney or ureters.  Numerous kidney infections. SYMPTOMS   Feeling sick to your stomach  (nauseous).  Throwing up (vomiting).  Blood in the urine (hematuria).  Pain that usually spreads (radiates) to the groin.  Frequency or urgency of urination. DIAGNOSIS   Taking a history and physical exam.  Blood or urine tests.  CT scan.  Occasionally, an examination of the inside of the urinary bladder (cystoscopy) is performed. TREATMENT   Observation.  Increasing your fluid intake.  Extracorporeal shock wave lithotripsy--This is a noninvasive procedure that uses shock waves to break up kidney stones.  Surgery may be needed if you have severe pain or persistent obstruction. There are various surgical procedures. Most of the procedures are performed with the use of small instruments. Only small incisions are needed to accommodate these instruments, so recovery time is minimized. The size, location, and chemical composition are all important variables that will determine the proper choice of action for you. Talk to your health care provider to better understand your situation so that you will minimize the risk of injury to yourself and your kidney.  HOME CARE INSTRUCTIONS   Drink enough water and fluids to keep your urine clear or pale yellow. This will help you to pass the stone or stone fragments.  Strain all urine through the provided strainer. Keep all particulate matter and stones for your health care provider to see. The stone causing the pain may be as small as a grain of salt. It is very important to use the strainer each and every time you pass your urine. The collection of your stone will allow your health care provider to analyze it and verify that a stone has actually passed. The stone analysis will often identify what you can do to reduce the incidence of recurrences.  Only take over-the-counter or prescription medicines for pain, discomfort, or fever as directed by your health care provider.  Keep all follow-up visits as told by your health care provider. This is  important.  Get follow-up X-rays if required. The absence of pain does not always mean that the stone has passed. It may have only stopped moving. If the urine remains completely obstructed, it can cause loss of kidney function or even complete destruction of the kidney. It is your responsibility to make sure X-rays and follow-ups are completed. Ultrasounds of the kidney can show blockages and the status of the kidney. Ultrasounds are not associated with any radiation and can be performed easily in a matter of minutes.  Make changes to your daily diet as told by your health care provider. You may be told to:  Limit the amount of salt that you eat.  Eat 5 or more servings of fruits and vegetables each day.  Limit the amount of meat, poultry, fish, and eggs that you eat.  Collect a 24-hour urine sample as told by your health care provider.You may need to collect another urine sample every 6-12 months. SEEK MEDICAL CARE IF:  You experience pain that is progressive and unresponsive to any pain medicine you have been prescribed. SEEK IMMEDIATE MEDICAL CARE IF:   Pain cannot be controlled with the prescribed medicine.  You have a fever or shaking chills.  The severity or intensity of pain increases over 18 hours and is not relieved  by pain medicine.  You develop a new onset of abdominal pain.  You feel faint or pass out.  You are unable to urinate.   This information is not intended to replace advice given to you by your health care provider. Make sure you discuss any questions you have with your health care provider.   Document Released: 08/25/2005 Document Revised: 05/16/2015 Document Reviewed: 01/26/2013 Elsevier Interactive Patient Education [employer].

## 2015-06-17 NOTE — Progress Notes (Signed)
Urology Inpatient Progress Report Left flank pain [R10.9] 06/16/2015  Intv/Subj: No acute events overnight. Patient is without complaint.  Past Medical History  Diagnosis Date  . DM (diabetes mellitus) ([place])   . Obesity   . Dyslipidemia   . HTN (hypertension)   . Hypothyroidism   . Gastroparesis   . Migraine   . Osteoporosis   . Colon polyps 7/07    hyperplastic and adenomatous  . Nephrolithiasis   . Diverticulosis   . External hemorrhoid   . Fatty liver   . Asthma   . CAD (coronary artery disease)     a. 12/2008 Cath: mild irregs throughout, EF 75%.  . 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 Allergy   . Cataract     "just beginning"  . Myocardial infarction ([place])     FEb 2015  . Depression     pt unsure, psychologist said no  . Sleep apnea     wears CPAP  . Hyperlipidemia    No current facility-administered medications for this encounter.   Current Outpatient Prescriptions  Medication Sig Dispense Refill  . albuterol (PROVENTIL HFA;VENTOLIN HFA) 108 (90 BASE) MCG/ACT inhaler Inhale 2 puffs into the lungs every 6 (six) hours as needed for wheezing or shortness of breath.     Marland Kitchen aspirin EC 81 MG tablet Take 81 mg by mouth daily.     . budesonide-formoterol (SYMBICORT) 160-4.5 MCG/ACT inhaler Inhale 2 puffs into the lungs 2 (two) times daily as needed (shortness of breath).    . carisoprodol (SOMA) 350 MG tablet Take 350 mg by mouth 4 (four) times daily.     . carvedilol (COREG) 12.5 MG tablet Take 1 tablet (12.5 mg total) by mouth 2 (two) times daily with a meal. 180 tablet 3  . Coenzyme Q10 (CO Q 10) 100 MG CAPS Take 100 mg by mouth 2 (two) times daily.     . CRESTOR 20 MG tablet TAKE 1 TABLET EVERY DAY (Patient taking differently: TAKE 1 TABLET EVERY DAY AT BEDTIME) 90 tablet 1  . fenofibrate 160 MG tablet Take 160 mg by mouth at bedtime.     . furosemide (LASIX) 20 MG tablet Take 1 tablet (20 mg total) by mouth every morning. (Patient taking differently: Take 20 mg  by mouth daily. ) 90 tablet 3  . gabapentin (NEURONTIN) 300 MG capsule Take 300 mg by mouth 3 (three) times daily.    Marland Kitchen HYDROcodone-acetaminophen (NORCO) 10-325 MG tablet Take 1 tablet by mouth 3 (three) times daily as needed (pain).   0  . insulin regular human CONCENTRATED (HUMULIN R) 500 UNIT/ML injection Inject 0.08-0.1 mLs (40-50 Units total) into the skin 3 (three) times daily with meals. Dx: E13.9 20 mL 2  . levothyroxine (SYNTHROID, LEVOTHROID) 75 MCG tablet Take 75 mcg by mouth daily.     Marland Kitchen losartan (COZAAR) 50 MG tablet Take 50 mg by mouth 2 (two) times daily.     . metFORMIN (GLUCOPHAGE) 1000 MG tablet Take 1,000 mg by mouth 2 (two) times daily with a meal.    . metoCLOPramide (REGLAN) 10 MG tablet Take 10 mg by mouth 4 (four) times daily.     . montelukast (SINGULAIR) 10 MG tablet Take 10 mg by mouth at bedtime.    . Multiple Vitamin (MULTIVITAMIN WITH MINERALS) TABS tablet Take 1 tablet by mouth daily. One A Day 50+    . nitroGLYCERIN (NITROSTAT) 0.4 MG SL tablet Place 1 tablet (0.4 mg total) under the tongue every  5 (five) minutes as needed for chest pain. 25 tablet 3  . omeprazole (PRILOSEC) 20 MG capsule Take 20 mg by mouth 2 (two) times daily before a meal.    . polyethylene glycol (MIRALAX / GLYCOLAX) packet Take 17 g by mouth every other day.     . topiramate (TOPAMAX) 50 MG tablet Take 50 mg by mouth 2 (two) times daily.    . Vitamin D, Ergocalciferol, (DRISDOL) 50000 UNITS CAPS Take 50,000 Units by mouth every 7 (seven) days. Thursdays    . cefUROXime (CEFTIN) 500 MG tablet Take 1 tablet (500 mg total) by mouth 2 (two) times daily with a meal. 14 tablet 0  . dicyclomine (BENTYL) 20 MG tablet Take one po BID (Patient not taking: Reported on 06/16/2015) 60 tablet 0     Objective: Vital: Filed Vitals:   06/16/15 1500 06/16/15 1600 06/16/15 2124 06/17/15 0537  BP: 128/47 142/51 123/44 160/72  Pulse: 81 76 80 92  Temp:   98.4 F (36.9 C) 98.3 F (36.8 C)  TempSrc:   Oral    Resp:   18 19  Height:      Weight:      SpO2: 93% 97% 94% 96%   I/Os: I/O last 3 completed shifts: In: 150 [P.O.:150] Out: 600 [Urine:600]  Physical Exam:  General: Patient is in no apparent distress Lungs: Normal respiratory effort, chest expands symmetrically. GI: The abdomen is soft and nontender without mass. Ext: lower extremities symmetric  Lab Results:  Recent Labs  06/16/15 1145 06/17/15 0740  WBC 11.7* 6.1  HGB 11.3* 10.4*  HCT 36.8 34.2*    Recent Labs  06/16/15 1145 06/17/15 0740  NA 134* 133*  K 4.5 4.3  CL 102 101  CO2 23 23  GLUCOSE 252* 216*  BUN 11 12  CREATININE 1.21* 1.09*  CALCIUM 9.6 8.9   No results for input(s): LABPT, INR in the last 72 hours. No results for input(s): LABURIN in the last 72 hours. Results for orders placed or performed during the hospital encounter of 06/16/15  Urine culture     Status: None (Preliminary result)   Collection Time: 06/16/15 11:34 AM  Result Value Ref Range Status   Specimen Description URINE, CATHETERIZED  Final   Special Requests Normal  Final   Culture CULTURE REINCUBATED FOR BETTER GROWTH  Final   Report Status PENDING  Incomplete    Studies/Results:   Assessment: The patient likely passed her stone.  She is afebrile, stable vital signs, no pain, labs unremarkable. Plan: I suspect the patient has passed her stone and dissection be discharged home.  She did have evidence of a UTI on her initial urine analysis.  As such, she should go home with 7 days of antibiotics.  We will get her scheduled for follow-up with Dr. Junious Silk, her primary urologist.  Louis Meckel W 06/17/2015, 4:31 PM

## 2015-06-17 NOTE — Progress Notes (Signed)
Discharge instructions gone over with patient. Home medications gone over with patient. Prescription electronically sent to patient's pharmacy. Follow up appointments to be made. Diet, activity, and reasons to call the doctor gone over. Information on urinary tract infections given. Completion of oral antibiotics stressed. Patient verbalized understanding of instructions.

## 2015-06-19 ENCOUNTER — Telehealth: Payer: Self-pay | Admitting: Internal Medicine

## 2015-06-19 LAB — URINE CULTURE
Culture: >=100000
Special Requests: NORMAL

## 2015-06-19 NOTE — Telephone Encounter (Signed)
Patient stated she is having problem with her blood sugar, 2:37 352, 377. was in the hospital saturady night  And she is on antibiotics, please advise

## 2015-06-19 NOTE — Telephone Encounter (Signed)
Called and d/w pt. She is on 8-10 units of U500 insulin 3 times a day. Sugars were controlled until this morning, despite being in the hospital with kidney stone and superimposed infection - discharged Saturday. She woke up this morning with a sugar of 377. She took 11 units (equivalent of 55 units U100 insulin) and 1.5 cup of goldfish crackers. In 2 hours, CBG decreased to 352. After 2 more hours, CBG increased to 382.  I advised her to take 14 units of U500 (equivalent of 70 units of U100) and eat 30 minutes later as she was hungry. I advised her to eat something with less carbs. She will then check sugars 2 hours after this may be bolus 2-4 units later if sugars still high.  She will call me in the morning to let me know how her sugars did overnight.

## 2015-06-19 NOTE — Telephone Encounter (Signed)
Please read message below. Called pt and she said that she was in the ER and then hospital from a kidney stone (as well as gastroparesis), she had an infection due to the length of time she had the kidney stone. She has been home since Sunday. She has only ate a handful of goldfish today and her blood sugar has jumped up this afternoon. Please advise. Thank you.

## 2015-06-20 NOTE — Telephone Encounter (Signed)
Called pt and advised her per Dr Arman Filter message below. Pt voiced understanding and will call back on Friday to advise [place] of her sugar readings.

## 2015-06-20 NOTE — Telephone Encounter (Signed)
OK, for the next day or 2, needs a little more insulin with meals, e.g 12 units of the U500 (equivalent of 60 units of U100).

## 2015-06-20 NOTE — Telephone Encounter (Signed)
Please read message below from pt and advise.

## 2015-06-20 NOTE — Telephone Encounter (Signed)
Patient b/s last night 9:58 pm 202,  12:55 pm 166,   This morning 4:00am  252,  8:18 254,  10:25 am 235. Didn't have to take any additional insulin last night

## 2015-06-26 ENCOUNTER — Telehealth: Payer: Self-pay | Admitting: Internal Medicine

## 2015-06-26 MED ORDER — INSULIN REGULAR HUMAN (CONC) 500 UNIT/ML ~~LOC~~ SOLN: 40.0000 [IU] | Freq: Three times a day (TID) | SUBCUTANEOUS | Status: DC

## 2015-06-26 NOTE — Telephone Encounter (Signed)
Done

## 2015-06-26 NOTE — Telephone Encounter (Signed)
Patient called stating that she would need a refill on her medication   Rx: Humulin R 500  Pharmacy: [facility]    Thank you

## 2015-07-02 ENCOUNTER — Telehealth: Payer: Self-pay | Admitting: Internal Medicine

## 2015-07-02 MED ORDER — NA SULFATE-K SULFATE-MG SULF 17.5-3.13-1.6 GM/177ML PO SOLN: 1.0000 | Freq: Once | ORAL | Status: DC

## 2015-07-02 NOTE — Telephone Encounter (Signed)
Suprep  re sent  

## 2015-07-03 ENCOUNTER — Ambulatory Visit: Payer: Medicare Other | Admitting: Internal Medicine

## 2015-07-04 ENCOUNTER — Encounter: Payer: Self-pay | Admitting: Internal Medicine

## 2015-07-04 ENCOUNTER — Ambulatory Visit (AMBULATORY_SURGERY_CENTER): Payer: Medicare Other | Admitting: Internal Medicine

## 2015-07-04 VITALS — BP 183/106 | HR 72 | Temp 97.3°F | Resp 17 | Ht 60.5 in | Wt 228.0 lb

## 2015-07-04 DIAGNOSIS — Z86010 Personal history of colonic polyps: Secondary | ICD-10-CM | POA: Diagnosis present

## 2015-07-04 DIAGNOSIS — D122 Benign neoplasm of ascending colon: Secondary | ICD-10-CM | POA: Diagnosis not present

## 2015-07-04 DIAGNOSIS — K635 Polyp of colon: Secondary | ICD-10-CM

## 2015-07-04 LAB — GLUCOSE, CAPILLARY
Glucose-Capillary: 141 mg/dL — ABNORMAL HIGH (ref 65–99)
Glucose-Capillary: 138 mg/dL — ABNORMAL HIGH (ref 65–99)

## 2015-07-04 MED ORDER — SODIUM CHLORIDE 0.9 % IV SOLN: 500.0000 mL | INTRAVENOUS | Status: DC

## 2015-07-04 NOTE — Patient Instructions (Signed)
YOU HAD AN ENDOSCOPIC PROCEDURE TODAY AT THE [facility]:   Refer to the procedure report that was given to you for any specific questions about what was found during the examination.  If the procedure report does not answer your questions, please call your gastroenterologist to clarify.  If you requested that your care partner not be given the details of your procedure findings, then the procedure report has been included in a sealed envelope for you to review at your convenience later.  YOU SHOULD EXPECT: Some feelings of bloating in the abdomen. Passage of more gas than usual.  Walking can help get rid of the air that was put into your GI tract during the procedure and reduce the bloating. If you had a lower endoscopy (such as a colonoscopy or flexible sigmoidoscopy) you may notice spotting of blood in your stool or on the toilet paper. If you underwent a bowel prep for your procedure, you may not have a normal bowel movement for a few days.  Please Note:  You might notice some irritation and congestion in your nose or some drainage.  This is from the oxygen used during your procedure.  There is no need for concern and it should clear up in a day or so.  SYMPTOMS TO REPORT IMMEDIATELY:   Following lower endoscopy (colonoscopy or flexible sigmoidoscopy):  Excessive amounts of blood in the stool  Significant tenderness or worsening of abdominal pains  Swelling of the abdomen that is new, acute  Fever of 100F or higher   For urgent or emergent issues, a gastroenterologist can be reached at any hour by calling [phone].   DIET: Your first meal following the procedure should be a small meal and then it is ok to progress to your normal diet. Heavy or fried foods are harder to digest and may make you feel nauseous or bloated.  Likewise, meals heavy in dairy and vegetables can increase bloating.  Drink plenty of fluids but you should avoid alcoholic beverages for 24  hours.  ACTIVITY:  You should plan to take it easy for the rest of today and you should NOT DRIVE or use heavy machinery until tomorrow (because of the sedation medicines used during the test).    FOLLOW UP: Our staff will call the number listed on your records the next business day following your procedure to check on you and address any questions or concerns that you may have regarding the information given to you following your procedure. If we do not reach you, we will leave a message.  However, if you are feeling well and you are not experiencing any problems, there is no need to return our call.  We will assume that you have returned to your regular daily activities without incident.  If any biopsies were taken you will be contacted by phone or by letter within the next 1-3 weeks.  Please call us at [phone] if you have not heard about the biopsies in 3 weeks.    SIGNATURES/CONFIDENTIALITY: You and/or your care partner have signed paperwork which will be entered into your electronic medical record.  These signatures attest to the fact that that the information above on your After Visit Summary has been reviewed and is understood.  Full responsibility of the confidentiality of this discharge information lies with you and/or your care-partner.  Polyp, diverticulosis and high fiber diet information given. 

## 2015-07-04 NOTE — Progress Notes (Signed)
Called to room to assist during endoscopic procedure.  Patient ID and intended procedure confirmed with present staff. Received instructions for my participation in the procedure from the performing physician.  

## 2015-07-04 NOTE — Progress Notes (Signed)
Patient denies any allergies to eggs or soy. 

## 2015-07-04 NOTE — Op Note (Signed)
[facility]  [address] [place], [id number]   COLONOSCOPY PROCEDURE REPORT  PATIENT: Danielle Vargas, Danielle Vargas  MR#: [id number] BIRTHDATE: 1950/09/13 , 59  yrs. old GENDER: female ENDOSCOPIST: Eustace Quail, MD REFERRED IF:OYDXAJOINOMV Program Recall PROCEDURE DATE:  07/04/2015 PROCEDURE:   Colonoscopy, surveillance and Colonoscopy with snare polypectomy X1 First Screening Colonoscopy - Avg.  risk and is 50 yrs.  old or older - No.  Prior Negative Screening - Now for repeat screening. N/A  History of Adenoma - Now for follow-up colonoscopy & has been > or = to 3 yrs.  Yes hx of adenoma.  Has been 3 or more years since last colonoscopy.  Polyps removed today? Yes ASA CLASS:   Class III INDICATIONS:Surveillance due to prior colonic neoplasia and PH Colon Adenoma. . Index exam elsewhere 2004 ("multiple polyps, some precancerous polyps"), 2007 (negative), February 2011 (negative) MEDICATIONS: Monitored anesthesia care and Propofol 200 mg IV  DESCRIPTION OF PROCEDURE:   After the risks benefits and alternatives of the procedure were thoroughly explained, informed consent was obtained.  The digital rectal exam revealed no abnormalities of the rectum.   The [id number]  endoscope was introduced through the anus and advanced to the cecum, which was identified by both the appendix and ileocecal valve. No adverse events experienced.   The quality of the prep was excellent. (Suprep was used)  The instrument was then slowly withdrawn as the colon was fully examined. Estimated blood loss is zero unless otherwise noted in this procedure report.   COLON FINDINGS: A single polyp measuring 2 mm in size was found in the ascending colon.  A polypectomy was performed with a cold snare.  The resection was complete, the polyp tissue was completely retrieved and sent to histology.   There was moderate diverticulosis noted in the left colon.   The examination was otherwise normal.   Retroflexed views revealed internal hemorrhoids. The time to cecum = 4.2 Withdrawal time = 10.9   The scope was withdrawn and the procedure completed. COMPLICATIONS: There were no immediate complications.  ENDOSCOPIC IMPRESSION: 1.   Single polyp was found in the ascending colon; polypectomy was performed with a cold snare 2.   Moderate diverticulosis was noted in the left colon 3.   The examination was otherwise normal  RECOMMENDATIONS: 1. Repeat colonoscopy in 5 years if polyp adenomatous; otherwise 10 years  eSigned:  Eustace Quail, MD 07/04/2015 8:59 AM   cc: The Patient and W.  Lutricia Feil, MD

## 2015-07-04 NOTE — Progress Notes (Signed)
  [facility] Post-op Note  Patient: Danielle Vargas  Procedure(s) Performed: colonoscopy  Patient Location: [facility] - Recovery Area  Anesthesia Type: Deep Sedation/Propofol  Level of Consciousness: awake, oriented and patient cooperative  Airway and Oxygen Therapy: Patient Spontanous Breathing  Post-op Pain: none  Post-op Assessment:  Post-op Vital signs reviewed, Patient's Cardiovascular Status Stable, Respiratory Function Stable, Patent Airway, No signs of Nausea or vomiting and Pain level controlled  Post-op Vital Signs: Reviewed and stable  Complications: No apparent anesthesia complications  Lott Seelbach E 9:00 AM

## 2015-07-05 ENCOUNTER — Telehealth: Payer: Self-pay | Admitting: *Deleted

## 2015-07-05 NOTE — Telephone Encounter (Signed)
  Follow up Call-  Call back number 07/04/2015  Post procedure Call Back phone  # [phone]  Permission to leave phone message Yes     Patient questions:  Do you have a fever, pain , or abdominal swelling? No. Pain Score  0 *  Have you tolerated food without any problems? Yes.    Have you been able to return to your normal activities? Yes.    Do you have any questions about your discharge instructions: Diet   No. Medications  No. Follow up visit  No.  Do you have questions or concerns about your Care? No.  Actions: * If pain score is 4 or above: No action needed, pain <4.

## 2015-07-06 ENCOUNTER — Encounter: Payer: Self-pay | Admitting: Internal Medicine

## 2015-07-10 ENCOUNTER — Encounter: Payer: Self-pay | Admitting: Internal Medicine

## 2015-07-17 ENCOUNTER — Ambulatory Visit (INDEPENDENT_AMBULATORY_CARE_PROVIDER_SITE_OTHER): Payer: Medicare Other | Admitting: Neurology

## 2015-07-17 ENCOUNTER — Encounter: Payer: Self-pay | Admitting: Neurology

## 2015-07-17 VITALS — BP 110/58 | HR 86 | Resp 20 | Ht 61.0 in | Wt 215.0 lb

## 2015-07-17 DIAGNOSIS — N200 Calculus of kidney: Secondary | ICD-10-CM | POA: Diagnosis not present

## 2015-07-17 DIAGNOSIS — I255 Ischemic cardiomyopathy: Secondary | ICD-10-CM

## 2015-07-17 DIAGNOSIS — G4733 Obstructive sleep apnea (adult) (pediatric): Secondary | ICD-10-CM | POA: Diagnosis not present

## 2015-07-17 DIAGNOSIS — Z9989 Dependence on other enabling machines and devices: Principal | ICD-10-CM

## 2015-07-17 MED ORDER — MODAFINIL 200 MG PO TABS: ORAL_TABLET | ORAL | Status: DC

## 2015-07-17 NOTE — Progress Notes (Signed)
SLEEP MED[facility]INE CLINIC   Provider:  Larey Seat, M D  Referring Provider: Marton Redwood, MD Primary Care Physician:  Marton Redwood, MD  Chief Complaint  Patient presents with  . Follow-up    cpap follow up, wakes up sweating, takes off cpap during night without knowing, rm 11, alone    HPI:  Danielle Vargas is a 64 y.o. female , seen here as a  NP referral   from Dr. Brigitte Pulse for  a sleep consultation.  09-18-14  Is followed by Dr. Maryla Morrow her endocrinologist, and for primary care followed by Dr. Carmie Kanner.  She is seen here  as a new patient after reporting to Dr. Brigitte Pulse that she is excessively daytime sleepy she also has increased pulmonary wheezing recently gets short of breath with any light activity or just carrying minor weight. She has been using albuterol more and more often and remains on Singulair in the past she was diagnosed with a cat her allergy but she no longer has suspect. Her allergies became better. But she reports here today is that she is in a significant amount of pain nearly every day that she has back pain and joint pain and also neuropathic sensory changes. She has not been able to lose any weight and by BMI is considered morbidly obese at this time. Her sleep has been non-restorative not refreshing for while. She states that she sleeps poorly partially due to pain and partially because she has to be up and down all night. She lives alone but is noted to snore. Her daughter and son-in-law have been visiting and noticed that she is snoring and irregularly breathing at night. This may be the cause for her excessive sleepiness during the day. She has often waking been waking up drowsy and was a dull headache. She is also taking hydrocodone for her back pain,  which she has been using for a while- this does however not provide her with enough coverage to sleep through the night The patient reports that she sleeps rarely more than 6 hours and almost always  only 4 hours at night -she goes usually to bed at around 2:00 in the morning. She  has been falling asleep on her sofa prior in the evening hours watching TV. So overall it is harder to judge how much sleep the patient truly gets, abut 6 hours she estimated.  The patient has mostly truncal obesity and father slender comparably extremities.She states that on her sofa, which is rather firm, she uses 2 pillows to prop her slightly up this seems to support her back and allows her to breathe better. In bed she uses only one pillow and no wedge and feels that she doesn't sleep as well and doesn't breathe as well. This probably could be helped by using a wedge to lift the head of bed up a little bit. She drinks caffeine in [place] form .  She goes to the bathroom 2-3 times at night.   The patient carries the additional diagnoses of fatty liver disease, morbid obesity, chronic pain treated with narcotics. 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 She has hypertension, hyperlipidemia, osteoporosis, vitamin D deficiency, colon polyps, nephrolithiasis, and carries a diagnosis of depression. She had she had a C-spine fusion a right carpal tunnel repair  and all non-nerve release and a left rotator cuff repair between 2002 and 2009. The patient lives alone she used to have cats which she does not longer keep due to her allergy potential, she has never been a smoker, she is not exposed to passive smoke. Family history is positive for alcoholism in her father who died of a stroke in his 61s and also COPD he was a heavy smoker as was her mother who suffers from diabetes mellitus type 2, peripheral vascular disease, emphysema, and congestive heart failure and died at age 46. A brother suffers  from psoriasis and kidney stones a sister is status post liver transplant after hepatic cirrhosis, her son and daughter are healthy.   Interval history from 12-08-14  Danielle Vargas is seen here today for a compliance visit .She underwent a sleep study by  split-night protocol on 10-08-14 . She endorsed the Epworth sleepiness score at 18 points she was also appearing severely depressed and her pHQ9 form was endorsed at 16 points.  Her sleep study revealed the highest AHI I have seen in the last 12 month, 142.9. She did not sleep in supine position. CPAP was initiated at 6 cm water and advance to 15 cm water she did the best at about 13 cm pressure.  The AHI was reduced to 16.3 overall and specifically at 12, 13 and 15 cm more effective to below 5. For this reason, I have ordered an autotitrating machine with a pressure window between 10 and 20 cm water. She used an Agricultural consultant nasal mask. If she can tolerate the mask fairly well,  I would like her to continue. 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  The patient reports that she truly loves her CPAP machine. I have a download available for the last 30 days she is 100% compliant patient with days of use and 90% compliance for days of over 4 hours of use. Average user time is 5 hours 20 minutes, the machine is an AutoSet between 10 and 20 cm water pressure with 3 cm EPR her residual AHI is 0.6 a reduction of almost 100%. Her 95th percentile pressure is 13.3. The patient will be today that she would like to have a second machine available because she is living in a two-story house and often sleeps either upstairs or downstairs and it is difficult for her to ambulate and carry an object. I suggested that we can look for refill brisk machines all returned machines that were donated. I would [facility] to keep an eye on this -if we are have any donation machines coming in.  I  can see that this would make it much easier for the patient. She likes the current nasal interface and I will not have to change that. I will schedule a revisit in 6 months and from there on yearly.  07-17-15, In the interval. Since our last visit Danielle Vargas presented to the hospital for recurrent nephrolithiasis, also she had nausea, vomiting and gastroparesis. Today she presents with an Epworth sleepiness score of 11 points and a fatigue severity score of 55 points, the geriatric depression score was endorsed  at 4 points. He reports that she can fall asleep very easily whenever not physically active or not stimulated. She acknowledges that some of her medications are sleepy making but still concerns her. I was also able to review her CPAP download  she has a 90% compliance for days of use but only 6 days was she able to use the machine 4 hours or longer. The average user time was only 2 hours and 55 minutes. The machine is an auto between 10 and 20 cm water, her residual AHI is 0.5 the pressures are 12.7 at the 95th percentile and she does not have major air leaks. She reports that she often took the mask off in the middle of the night-  being unaware that she did. The machine clearly helps to reduce the apnea so we have to discuss how to help her not losing the interface at night. I like for the patient to adjust her bedtime from 1 or 2 AM slowly towards pre-midnight. I would also like her to use the machine in daytime if she takes a nap. My goal is to expand the use of CPAP 2 over 4 hours nightly. The interface is currently comfortable enough that does need to be changed . She uses a nasal mask. Patient sleeps on her side, she sleeps on one pillow only. I will recommend a horseshoe pillow this could be one of the travel pillows or bean bag pillows. It will help to not dislodge the mask at night.    Review of Systems: Out of a complete 14 system review, the patient complains of only the following  symptoms, and all other reviewed systems are negative. The patient endorsed coughing, wheezing, shortness of breath, snoring apparently witnessed with occasional gaps in her nocturnal breathing.  Epworth score  11 from 12 from 17 , Fatigue severity score 55 from 57 from   63 points  ,    Social History   Social History  . Marital Status: Divorced    Spouse Name: N/A  . Number of Children: 2  . Years of Education: N/A   Occupational History  . disabled    Social History Main Topics  . Smoking status: Never Smoker   . Smokeless tobacco: Never Used  . Alcohol Use: No  . Drug Use: No  . Sexual Activity: Not Currently    Birth Control/ Protection: Post-menopausal   Other Topics Concern  . Not on file   Social History Narrative   Lives in [facility] alone.  Retired.  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   Regular exercise: none   Caffeine use: daily; dt coke             Family History  Problem Relation Age of Onset  . COPD Father   . Stroke Father   . Emphysema Father   . Heart failure Brother   . Heart failure Mother   . Heart attack Mother     several  . Diabetes Mother   . Kidney disease Mother   . Hypertension Brother   . Colon cancer  45    mat. 1st cousin  . Other      celiac sprue, 1/2 brother  . Diabetes Brother     x 3  . Lung cancer Brother   . Cirrhosis Sister     liver transplant  . Liver disease Sister     transplant  . Heart attack Paternal Grandfather   . Heart attack Brother   . Esophageal cancer Neg Hx   . Stomach cancer Neg Hx   . Rectal cancer Neg Hx   . Colon cancer Paternal Aunt 77    Past Medical History  Diagnosis Date  . DM (diabetes mellitus) (  [place])   . Obesity   . Dyslipidemia   . HTN (hypertension)   . Hypothyroidism   . Gastroparesis   . Migraine   . Osteoporosis   . Colon polyps 7/07    hyperplastic and adenomatous  . Nephrolithiasis   . Diverticulosis   . External hemorrhoid   . Fatty liver   .  Asthma   . CAD (coronary artery disease)     a. 12/2008 Cath: mild irregs throughout, EF 75%.  . 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 Allergy   . Cataract     "just beginning"  . Myocardial infarction ([place])     FEb 2015  . Depression     pt unsure, psychologist said no  . Sleep apnea     wears CPAP  . Hyperlipidemia     Past Surgical History  Procedure Laterality Date  . Tubal ligation  '85  . Breast reduction surgery  '88  . Cervical disc surgery  '98    fusion  . Carpal tunnel release  2000    right wrist  . Rotator cuff repair  '09    left shoulder  . Ulnar nerve repair  '02, '08    left done, then right  . Cesarean section  '79  '84  . Left heart catheterization with coronary angiogram N/A 10/26/2013    Procedure: LEFT HEART CATHETERIZATION WITH CORONARY ANGIOGRAM;  Surgeon: Sinclair Grooms, MD;  Location: [facility] CATH LAB;  Service: Cardiovascular;  Laterality: N/A;  . Colonoscopy      Current Outpatient Prescriptions  Medication Sig Dispense Refill  . albuterol (PROVENTIL HFA;VENTOLIN HFA) 108 (90 BASE) MCG/ACT inhaler Inhale 2 puffs into the lungs every 6 (six) hours as needed for wheezing or shortness of breath.     Marland Kitchen aspirin EC 81 MG tablet Take 81 mg by mouth daily.     . budesonide-formoterol (SYMBICORT) 160-4.5 MCG/ACT inhaler Inhale 2 puffs into the lungs 2 (two) times daily as needed (shortness of breath).    . carisoprodol (SOMA) 350 MG tablet Take 350 mg by mouth 4 (four) times daily.     . carvedilol (COREG) 12.5 MG tablet Take 1 tablet (12.5 mg total) by mouth 2 (two) times daily with a meal. 180 tablet 3  . Coenzyme Q10 (CO Q 10) 100 MG CAPS Take 100 mg by mouth 2 (two) times daily.     . CRESTOR 20 MG tablet TAKE 1 TABLET EVERY DAY (Patient taking differently: TAKE 1 TABLET EVERY DAY AT BEDTIME) 90 tablet 1  . cyclobenzaprine (FLEXERIL) 10 MG tablet     . fenofibrate 160 MG tablet Take 160 mg by mouth at bedtime.     . furosemide (LASIX) 20 MG tablet  Take 1 tablet (20 mg total) by mouth every morning. (Patient taking differently: Take 20 mg by mouth daily. ) 90 tablet 3  . gabapentin (NEURONTIN) 300 MG capsule Take 300 mg by mouth 3 (three) times daily.    Marland Kitchen HYDROcodone-acetaminophen (NORCO) 10-325 MG tablet Take 1 tablet by mouth 3 (three) times daily as needed (pain).   0  . insulin regular human CONCENTRATED (HUMULIN R) 500 UNIT/ML injection Inject 0.08-0.1 mLs (40-50 Units total) into the skin 3 (three) times daily with meals. Dx: E13.9 20 mL 2  . levothyroxine (SYNTHROID, LEVOTHROID) 75 MCG tablet Take 75 mcg by mouth daily.     Marland Kitchen losartan (COZAAR) 50 MG tablet Take 50 mg by mouth 2 (two) times daily.     Marland Kitchen  metFORMIN (GLUCOPHAGE) 1000 MG tablet Take 1,000 mg by mouth 2 (two) times daily with a meal.    . metoCLOPramide (REGLAN) 10 MG tablet Take 10 mg by mouth 4 (four) times daily.     . montelukast (SINGULAIR) 10 MG tablet Take 10 mg by mouth at bedtime.    . Multiple Vitamin (MULTIVITAMIN WITH MINERALS) TABS tablet Take 1 tablet by mouth daily. One A Day 50+    . nitroGLYCERIN (NITROSTAT) 0.4 MG SL tablet Place 1 tablet (0.4 mg total) under the tongue every 5 (five) minutes as needed for chest pain. 25 tablet 3  . omeprazole (PRILOSEC) 20 MG capsule Take 20 mg by mouth 2 (two) times daily before a meal.    . polyethylene glycol (MIRALAX / GLYCOLAX) packet Take 17 g by mouth every other day.     . topiramate (TOPAMAX) 50 MG tablet Take 50 mg by mouth 2 (two) times daily.    . Vitamin D, Ergocalciferol, (DRISDOL) 50000 UNITS CAPS Take 50,000 Units by mouth every 7 (seven) days. Thursdays     No current facility-administered medications for this visit.    Allergies as of 07/17/2015 - Review Complete 07/17/2015  Allergen Reaction Noted  . Invokana [canagliflozin] Diarrhea 09/02/2013  . Nsaids Other (See Comments) 02/08/2012  . Penicillins Rash 03/13/2015    Vitals: BP 110/58 mmHg  Pulse 86  Resp 20  Ht 5\' 1"  (1.549 m)  Wt 215 lb  (97.523 kg)  BMI 40.64 kg/m2 Last Weight:  Wt Readings from Last 1 Encounters:  07/17/15 215 lb (97.523 kg)       Last Height:   Ht Readings from Last 1 Encounters:  07/17/15 5\' 1"  (1.549 m)    Physical exam:  General: The patient is awake, alert and appears not in acute distress. The patient is well groomed. Head: Normocephalic, atraumatic. Neck is supple. Mallampati 3, Goiter. Anterior fusion scar. (1998 ) neck circumference: large 16.75 inches.  Nasal airflow unrestricted  TMJ is not evident . Retrognathia is seen.  Cardiovascular:  Regular rate and rhythm , without  murmurs or carotid bruit, and without distended neck veins. Respiratory: Lungs : wheezing with  auscultation. Skin:  Without evidence of edema, or rash.  abdominal surgeries, 2 cesarian sections.  Trunk: BMI is severely  elevated and patient  has a  Hunched posture, she carries an enormous pinnus.  Neurologic exam : The patient is awake and alert, oriented to place and time.   Memory subjective  described as intact. There is a normal attention span & concentration ability.  Speech is fluent without Dysarthria, but with dysphonia -no  Aphasia. Mood and affect are normal.  Cranial nerves: Pupils are equal, small ,  not reactive to light.  Extraocular movements  in vertical and horizontal planes intact and without nystagmus. Visual fields by finger perimetry are intact. Hearing to finger rub intact. Facial motor strength is symmetric and tongue and uvula move midline. Motor exam:  Normal tone, muscle bulk and symmetric, strength in all extremities. She has small extremities and her obesity is strongly truncal and abdominal,  Sensory:  Fine touch, pinprick and vibration were tested in all extremities.  Coordination: Rapid alternating movements in the fingers/hands is normal.  Finger-to-nose maneuver normal without evidence of ataxia, dysmetria or tremor.  Gait and station: Patient walks without assistive device and is  able unassisted to climb up to the exam table. Strength within normal limits. Stance is stable and normal.  TDeep tendon reflexes: in the  upper and lower extremities are symmetric, unusually brisk for DM. No clonus.  Babinski maneuver response is equivocal, downgoing.   Assessment:  After physical and neurologic examination, review of laboratory studies, imaging, neurophysiology testing and pre-existing records, assessment is  1)Patient with morbid obesity, shortness of breath and morning headaches, nocturia and poor sleep hygiene, worsened by narcotic use. hypoventilation, severest OSA .  The patient has responded well to CPAP and her residual AHI was 0.5 on today's download in office. She has to work on using the machine a little longer. I recommend to start CPAP use earlier in go earlier to bed but also to plan on using the CPAP if she takes a short nap. Overall she should aim for 4-1/2 hours of use each night. There will be no change in settings as this machine is on  AutoSet.  residual sleepiness is still high. Compared to the very high baseline AHI this is a huge success. The patient had a reduction in her daytime sleepiness but not the Lazarus effect. I would like to discuss with her the possible use of modafinil or another medication that will help her to keep awake in daytime.  She has no history of cardiac arrhythmia, and for this reason she would be considered a possible candidate for modafinil. Modafinil is not habit-forming in isolated cases is will produce headaches especially if the patient continues to use caffeine. So she may have to reduce her caffeine intake in order to use the medication.  The patient was advised of the nature of the diagnosed sleep disorder , the treatment options and risks for general a health and wellness arising from not treating the condition. Visit duration was 25 minutes.   Over 50% of our visit time face-to-face were spent in discussion of the patient's  past medical history, obtaining a differential diagnosis through system review, discussing the possible diagnosis and the treatment options.  Plan:  Treatment plan and additional workup : as outlined above.   Modafinil for EDS, on OSA with CPAP. Improved sleep hygiene.  Improved compliance.   rv in 1 month with Ward Givens.     Danielle Partridge Cordelia Bessinger MD  07/17/2015

## 2015-07-17 NOTE — Patient Instructions (Signed)
Modafinil tablets What is this medicine? MODAFINIL (moe DAF i nil) is used to treat excessive sleepiness caused by certain sleep disorders. This includes narcolepsy, sleep apnea, and shift work sleep disorder. This medicine may be used for other purposes; ask your health care provider or pharmacist if you have questions. What should I tell my health care provider before I take this medicine? They need to know if you have any of these conditions: -history of depression, mania, or other mental disorder -kidney disease -liver disease -an unusual or allergic reaction to modafinil, other medicines, foods, dyes, or preservatives -pregnant or trying to get pregnant -breast-feeding How should I use this medicine? Take this medicine by mouth with a glass of water. Follow the directions on the prescription label. Take your doses at regular intervals. Do not take your medicine more often than directed. Do not stop taking except on your doctor's advice. A special MedGuide will be given to you by the pharmacist with each prescription and refill. Be sure to read this information carefully each time. Talk to your pediatrician regarding the use of this medicine in children. This medicine is not approved for use in children. Overdosage: If you think you have taken too much of this medicine contact a poison control center or emergency room at once. NOTE: This medicine is only for you. Do not share this medicine with others. What if I miss a dose? If you miss a dose, take it as soon as you can. If it is almost time for your next dose, take only that dose. Do not take double or extra doses. What may interact with this medicine? Do not take this medicine with any of the following medications: -amphetamine or dextroamphetamine -dexmethylphenidate or methylphenidate -medicines called MAO Inhibitors like Nardil, Parnate, Marplan, Eldepryl -pemoline -procarbazine This medicine may also interact with the following  medications: -antifungal medicines like itraconazole or ketoconazole -barbiturates like phenobarbital -birth control pills or other hormone-containing birth control devices or implants -carbamazepine -cyclosporine -diazepam -medicines for depression, anxiety, or psychotic disturbances -phenytoin -propranolol -triazolam -warfarin This list may not describe all possible interactions. Give your health care provider a list of all the medicines, herbs, non-prescription drugs, or dietary supplements you use. Also tell them if you smoke, drink alcohol, or use illegal drugs. Some items may interact with your medicine. What should I watch for while using this medicine? Visit your doctor or health care professional for regular checks on your progress. The full effects of this medicine may not be seen right away. This medicine may affect your concentration, function, or may hide signs that you are tired. You may get dizzy. Do not drive, use machinery, or do anything that needs mental alertness until you know how this drug affects you. Alcohol can make you more dizzy and may interfere with your response to this medicine or your alertness. Avoid alcoholic drinks. Birth control pills may not work properly while you are taking this medicine. Talk to your doctor about using an extra method of birth control. It is unknown if the effects of this medicine will be increased by the use of caffeine. Caffeine is available in many foods, beverages, and medications. Ask your doctor if you should limit or change your intake of caffeine-containing products while on this medicine. What side effects may I notice from receiving this medicine? Side effects that you should report to your doctor or health care professional as soon as possible: -allergic reactions like skin rash, itching or hives, swelling of the face,   lips, or tongue -anxiety -breathing problems -chest pain -fast, irregular  heartbeat -hallucinations -increased blood pressure -redness, blistering, peeling or loosening of the skin, including inside the mouth -sore throat, fever, or chills -suicidal thoughts or other mood changes -tremors -vomiting Side effects that usually do not require medical attention (report to your doctor or health care professional if they continue or are bothersome): -headache -nausea, diarrhea, or stomach upset -nervousness -trouble sleeping This list may not describe all possible side effects. Call your doctor for medical advice about side effects. You may report side effects to FDA at 1-800-FDA-1088. Where should I keep my medicine? Keep out of the reach of children. This medicine can be abused. Keep your medicine in a safe place to protect it from theft. Do not share this medicine with anyone. Selling or giving away this medicine is dangerous and against the law. This medicine may cause accidental overdose and death if taken by other adults, children, or pets. Mix any unused medicine with a substance like cat litter or coffee grounds. Then throw the medicine away in a sealed container like a sealed bag or a coffee can with a lid. Do not use the medicine after the expiration date. Store at room temperature between 20 and 25 degrees C (68 and 77 degrees F). NOTE: This sheet is a summary. It may not cover all possible information. If you have questions about this medicine, talk to your doctor, pharmacist, or health care provider.    2016, Elsevier/Gold Standard. (2014-05-16 15:34:55)  

## 2015-07-24 ENCOUNTER — Telehealth: Payer: Self-pay | Admitting: *Deleted

## 2015-07-24 NOTE — Telephone Encounter (Signed)
I thought we already switched back, sorry for the misunderstanding!  Please continue U500 until she runs out, then restart:  Lantus to 40 units in am and 40 units at bedtime, for now. Humulin R U100: - 40 units (small meal)  - 44 units (larger meal)  For dinner, use 48-50 units.  For a low carb meal, use 30-34 units. Sliding scale Humulin insulin: - 150- 165: + 1 unit  - 166- 180: + 2 units  - 181- 195: + 3 units  - 196- 210: + 4 units  - >210: + 5 units   Please call us with sugars in 1 week after starting this.

## 2015-07-24 NOTE — Telephone Encounter (Signed)
Advised pt per note, increase Lantus to 45 units at bedtime. Pt stated that she is on the Humulin R-500 and no longer on Lantus. Pt stated that the Humulin R-500 waS $1200 for 2 vials of insulin. Pt stated she cannot afford this. Please review and advise.

## 2015-07-25 MED ORDER — INSULIN REGULAR HUMAN 100 UNIT/ML IJ SOLN: INTRAMUSCULAR | Status: DC

## 2015-07-25 NOTE — Telephone Encounter (Signed)
Called pt and advised her per Dr Arman Filter message. Pt voiced understanding. Will send Humulin R 100.

## 2015-08-14 ENCOUNTER — Encounter: Payer: Self-pay | Admitting: Adult Health

## 2015-08-14 ENCOUNTER — Ambulatory Visit: Payer: Medicare Other | Admitting: Adult Health

## 2015-08-14 ENCOUNTER — Ambulatory Visit (INDEPENDENT_AMBULATORY_CARE_PROVIDER_SITE_OTHER): Payer: Medicare Other | Admitting: Adult Health

## 2015-08-14 VITALS — BP 129/59 | HR 91 | Ht 61.0 in | Wt 219.0 lb

## 2015-08-14 DIAGNOSIS — I255 Ischemic cardiomyopathy: Secondary | ICD-10-CM

## 2015-08-14 DIAGNOSIS — G4719 Other hypersomnia: Secondary | ICD-10-CM

## 2015-08-14 DIAGNOSIS — Z9989 Dependence on other enabling machines and devices: Principal | ICD-10-CM

## 2015-08-14 DIAGNOSIS — G4733 Obstructive sleep apnea (adult) (pediatric): Secondary | ICD-10-CM | POA: Diagnosis not present

## 2015-08-14 NOTE — Progress Notes (Signed)
PATIENT: Danielle Vargas DOB: [birth year]  REASON FOR VISIT: follow up- OSA HISTORY FROM: patient  HISTORY OF PRESENT ILLNESS:  Ms Fischel is a 64 year old female with a history of obstructive sleep apnea on CPAP. She returns today for a compliance download. She used her machine 29 out of 30 days for compliance of 97%. However she only uses her machine greater than 4 hours 13 out of 30 days for compliance of 43%. On average she uses her machine 3 hours and 31 minutes. This is slightly better than her previous download. Her residual AHI 0.5. The patient does not have a significant leak. She is on AutoSet with a minimum pressure of 10 cm of water and maximum pressure of 20 cm water with EPR of 3. The patient states that she puts the mask on as soon as she goes to bed. However she will wake up in the morning and it will be sitting on her nightstand. She does not recall taking it off during the night. She states that she is wondering if she becomes claustrophobic and removes the mask without knowing during the night. She is curious of a new mask would be beneficial.  Her Epworth sleepiness score is 19 she reports that she did not try modafinil she did not want to add on a new medication. She really feels that her sleepiness is due to the medication she is already on. She reports that she is on the muscle relaxer Soma but this will not be covered under her insurance next year. She states that her primary care provider tried her on a different muscle relaxer although she cannot recall the name she states that she was unable to tolerate this as it caused extreme drowsiness. She has restarted the [facility] for now but we'll have to try different medication once the prescription expires. She denies any new neurological symptoms. She returns today for an evaluation.  HISTORY (DOHMEIER):  07-17-15, In the interval. Since our last visit Mrs. Innes presented to the hospital for recurrent nephrolithiasis, also she  had nausea, vomiting and gastroparesis. Today she presents with an Epworth sleepiness score of 11 points and a fatigue severity score of 55 points, the geriatric depression score was endorsed at 4 points. He reports that she can fall asleep very easily whenever not physically active or not stimulated. She acknowledges that some of her medications are sleepy making but still concerns her. I was also able to review her CPAP download she has a 90% compliance for days of use but only 6 days was she able to use the machine 4 hours or longer. The average user time was only 2 hours and 55 minutes. The machine is an auto between 10 and 20 cm water, her residual AHI is 0.5 the pressures are 12.7 at the 95th percentile and she does not have major air leaks. She reports that she often took the mask off in the middle of the night- being unaware that she did. The machine clearly helps to reduce the apnea so we have to discuss how to help her not losing the interface at night. I like for the patient to adjust her bedtime from 1 or 2 AM slowly towards pre-midnight. I would also like her to use the machine in daytime if she takes a nap. My goal is to expand the use of CPAP 2 over 4 hours nightly. The interface is currently comfortable enough that does need to be changed . She uses a nasal mask. Patient  sleeps on her side, she sleeps on one pillow only. I will recommend a horseshoe pillow this could be one of the travel pillows or bean bag pillows. It will help to not dislodge the mask at night.  REVIEW OF SYSTEMS: Out of a complete 14 system review of symptoms, the patient complains only of the following symptoms, and all other reviewed systems are negative.  ALLERGIES: Allergies  Allergen Reactions  . Invokana [Canagliflozin] Diarrhea  . Nsaids Other (See Comments)    GI problems/ stomach pain  . Penicillins Rash    Has patient had a PCN reaction causing immediate rash, facial/tongue/throat swelling, SOB or  lightheadedness with hypotension: No (rash on legs after 3 days [place] -June 2016) Has patient had a PCN reaction causing severe rash involving mucus membranes or skin necrosis: No Has patient had a PCN reaction that required hospitalization No Has patient had a PCN reaction occurring within the last 10 years: Yes If all of the above answers are "NO", then may proceed with Cephalosporin use.    HOME MEDICATIONS: Outpatient Prescriptions Prior to Visit  Medication Sig Dispense Refill  . albuterol (PROVENTIL HFA;VENTOLIN HFA) 108 (90 BASE) MCG/ACT inhaler Inhale 2 puffs into the lungs every 6 (six) hours as needed for wheezing or shortness of breath.     Marland Kitchen aspirin EC 81 MG tablet Take 81 mg by mouth daily.     . budesonide-formoterol (SYMBICORT) 160-4.5 MCG/ACT inhaler Inhale 2 puffs into the lungs 2 (two) times daily as needed (shortness of breath).    . carisoprodol (SOMA) 350 MG tablet Take 350 mg by mouth 4 (four) times daily.     . carvedilol (COREG) 12.5 MG tablet Take 1 tablet (12.5 mg total) by mouth 2 (two) times daily with a meal. 180 tablet 3  . Coenzyme Q10 (CO Q 10) 100 MG CAPS Take 100 mg by mouth 2 (two) times daily.     . CRESTOR 20 MG tablet TAKE 1 TABLET EVERY DAY (Patient taking differently: TAKE 1 TABLET EVERY DAY AT BEDTIME) 90 tablet 1  . cyclobenzaprine (FLEXERIL) 10 MG tablet     . fenofibrate 160 MG tablet Take 160 mg by mouth at bedtime.     . furosemide (LASIX) 20 MG tablet Take 1 tablet (20 mg total) by mouth every morning. (Patient taking differently: Take 20 mg by mouth daily. ) 90 tablet 3  . gabapentin (NEURONTIN) 300 MG capsule Take 300 mg by mouth 3 (three) times daily.    Marland Kitchen HYDROcodone-acetaminophen (NORCO) 10-325 MG tablet Take 1 tablet by mouth 3 (three) times daily as needed (pain).   0  . insulin regular (HUMULIN R) 100 units/mL injection Inject into the skin 40 units with a small meal and 44 units with a larger meal; 3 times daily. Plus sliding scale. (Patient  taking differently: Inject into the skin 40 units with a small meal and 45 units with a larger meal; 3 times daily. Plus sliding scale.) 50 mL 2  . levothyroxine (SYNTHROID, LEVOTHROID) 75 MCG tablet Take 75 mcg by mouth daily.     Marland Kitchen losartan (COZAAR) 50 MG tablet Take 50 mg by mouth 2 (two) times daily.     . metFORMIN (GLUCOPHAGE) 1000 MG tablet Take 1,000 mg by mouth 2 (two) times daily with a meal.    . metoCLOPramide (REGLAN) 10 MG tablet Take 10 mg by mouth 4 (four) times daily.     . montelukast (SINGULAIR) 10 MG tablet Take 10 mg by mouth  at bedtime.    . Multiple Vitamin (MULTIVITAMIN WITH MINERALS) TABS tablet Take 1 tablet by mouth daily. One A Day 50+    . nitroGLYCERIN (NITROSTAT) 0.4 MG SL tablet Place 1 tablet (0.4 mg total) under the tongue every 5 (five) minutes as needed for chest pain. 25 tablet 3  . omeprazole (PRILOSEC) 20 MG capsule Take 20 mg by mouth 2 (two) times daily before a meal.    . polyethylene glycol (MIRALAX / GLYCOLAX) packet Take 17 g by mouth every other day.     . topiramate (TOPAMAX) 50 MG tablet Take 50 mg by mouth 2 (two) times daily.    . Vitamin D, Ergocalciferol, (DRISDOL) 50000 UNITS CAPS Take 50,000 Units by mouth every 7 (seven) days. Thursdays    . insulin regular human CONCENTRATED (HUMULIN R) 500 UNIT/ML injection Inject 0.08-0.1 mLs (40-50 Units total) into the skin 3 (three) times daily with meals. Dx: E13.9 20 mL 2  . modafinil (PROVIGIL) 200 MG tablet Take one half tab in AM and 1/2 tab at lunch for daytime alertness. 30 tablet 2   No facility-administered medications prior to visit.    PAST MEDICAL HISTORY: Past Medical History  Diagnosis Date  . DM (diabetes mellitus) ([place])   . Obesity   . Dyslipidemia   . HTN (hypertension)   . Hypothyroidism   . Gastroparesis   . Migraine   . Osteoporosis   . Colon polyps 7/07    hyperplastic and adenomatous  . Nephrolithiasis   . Diverticulosis   . External hemorrhoid   . Fatty liver   .  Asthma   . CAD (coronary artery disease)     a. 12/2008 Cath: mild irregs throughout, EF 75%.  . 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 Allergy   . Cataract     "just beginning"  . Myocardial infarction ([place])     FEb 2015  . Depression     pt unsure, psychologist said no  . Sleep apnea     wears CPAP  . Hyperlipidemia     PAST SURGICAL HISTORY: Past Surgical History  Procedure Laterality Date  . Tubal ligation  '85  . Breast reduction surgery  '88  . Cervical disc surgery  '98    fusion  . Carpal tunnel release  2000    right wrist  . Rotator cuff repair  '09    left shoulder  . Ulnar nerve repair  '02, '08    left done, then right  . Cesarean section  '79  '84  . Left heart catheterization with coronary angiogram N/A 10/26/2013    Procedure: LEFT HEART CATHETERIZATION WITH CORONARY ANGIOGRAM;  Surgeon: Sinclair Grooms, MD;  Location: [facility] CATH LAB;  Service: Cardiovascular;  Laterality: N/A;  . Colonoscopy      FAMILY HISTORY: Family History  Problem Relation Age of Onset  . COPD Father   . Stroke Father   . Emphysema Father   . Heart failure Brother   . Heart failure Mother   . Heart attack Mother     several  . Diabetes Mother   . Kidney disease Mother   . Hypertension Brother   . Colon cancer  45    mat. 1st cousin  . Other      celiac sprue, 1/2 brother  . Diabetes Brother     x 3  . Lung cancer Brother   . Cirrhosis Sister     liver transplant  . Liver disease  Sister     transplant  . Heart attack Paternal Grandfather   . Heart attack Brother   . Esophageal cancer Neg Hx   . Stomach cancer Neg Hx   . Rectal cancer Neg Hx   . Colon cancer Paternal Aunt 68    SOCIAL HISTORY: Social History   Social History  . Marital Status: Divorced    Spouse Name: N/A  . Number of Children: 2  . Years of Education: N/A   Occupational History  . disabled    Social History Main Topics  . Smoking status: Never Smoker   . Smokeless tobacco: Never  Used  . Alcohol Use: No  . Drug Use: No  . Sexual Activity: Not Currently    Birth Control/ Protection: Post-menopausal   Other Topics Concern  . Not on file   Social History Narrative   Lives in [facility] alone.  Retired.  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   Regular exercise: none   Caffeine use: daily; dt coke               PHYSICAL EXAM  Filed Vitals:   08/14/15 1057  BP: 129/59  Pulse: 91  Height: 5\' 1"  (1.549 m)  Weight: 219 lb (99.338 kg)   Body mass index is 41.4 kg/(m^2).  Generalized: Well developed, in no acute distress    Neurological examination  Mentation: Alert oriented to time, place, history taking. Follows all commands speech and language fluent Cranial nerve II-XII: Pupils were equal round reactive to light. Extraocular movements were full, visual field were full on confrontational test. Facial sensation and strength were normal. Uvula tongue midline. Head turning and shoulder shrug  were normal and symmetric. Motor: The motor testing reveals 5 over 5 strength of all 4 extremities. Good symmetric motor tone is noted throughout.  Sensory: Sensory testing is intact to soft touch on all 4 extremities. No evidence of extinction is noted.  Coordination: Cerebellar testing reveals good finger-nose-finger and heel-to-shin bilaterally.  Gait and station: Gait is normal.  Reflexes: Deep tendon reflexes are symmetric and normal bilaterally.   DIAGNOSTIC DATA (LABS, IMAGING, TESTING) - I reviewed patient records, labs, notes, testing and imaging myself where available.  Lab Results  Component Value Date   WBC 6.1 06/17/2015   HGB 10.4* 06/17/2015   HCT 34.2* 06/17/2015   MCV 86.8 06/17/2015   PLT 246 06/17/2015      Component Value Date/Time   NA 133* 06/17/2015 0740   K 4.3 06/17/2015 0740   CL 101 06/17/2015 0740   CO2 23 06/17/2015 0740   GLUCOSE 216* 06/17/2015 0740   BUN 12 06/17/2015 0740   CREATININE 1.09* 06/17/2015 0740    CALCIUM 8.9 06/17/2015 0740   PROT 7.1 03/21/2015 1700   ALBUMIN 3.7 03/21/2015 1700   AST 29 03/21/2015 1700   ALT 21 03/21/2015 1700   ALKPHOS 47 03/21/2015 1700   BILITOT 0.6 03/21/2015 1700   GFRNONAA 53* 06/17/2015 0740   GFRAA >60 06/17/2015 0740   Lab Results  Component Value Date   CHOL 115 10/27/2013   HDL 21* 10/27/2013   LDLCALC 47 10/27/2013   TRIG 236* 10/27/2013   CHOLHDL 5.5 10/27/2013   Lab Results  Component Value Date   HGBA1C 7.4* 12/25/2014    ASSESSMENT AND PLAN 64 y.o. year old female  has a past medical history of DM (diabetes mellitus) ([place]); Obesity; Dyslipidemia; HTN (hypertension); Hypothyroidism; Gastroparesis; Migraine; Osteoporosis; Colon polyps (7/07); Nephrolithiasis; Diverticulosis; External  hemorrhoid; Fatty liver; Asthma; CAD (coronary artery disease); Hypersomnia with sleep apnea (09/18/2014); Allergy; Cataract; Myocardial infarction ([place]); Depression; Sleep apnea; and Hyperlipidemia. here with:   1. Obstructive sleep apnea on CPAP  The patient's compliance download has improved slightly. I would like the patient to use the machine greater than 4 hours more often. The patient feels that maybe a new type the mask would be beneficial. I will have her stop in the sleep lab today to  Try a different style mask. The patient is amenable to this plan. She will follow-up in 3 months for a compliance download.    Ward Givens, MSN, NP-C 08/14/2015, 11:41 AM [facility] [address] [place], [place] [address] [phone]

## 2015-08-14 NOTE — Telephone Encounter (Signed)
Error

## 2015-08-14 NOTE — Progress Notes (Signed)
I agree with the assessment and plan as directed by NP .The patient is known to me .   Meda Dudzinski, MD  

## 2015-08-14 NOTE — Patient Instructions (Signed)
Mask refitted or new mask with sleep lab Try to use the CPAP >4 hours each night.  If your symptoms worsen or you develop new symptoms please let us know.

## 2015-08-22 ENCOUNTER — Ambulatory Visit (INDEPENDENT_AMBULATORY_CARE_PROVIDER_SITE_OTHER): Payer: Medicare Other | Admitting: Ophthalmology

## 2015-08-22 DIAGNOSIS — H43813 Vitreous degeneration, bilateral: Secondary | ICD-10-CM

## 2015-08-22 DIAGNOSIS — H35033 Hypertensive retinopathy, bilateral: Secondary | ICD-10-CM

## 2015-08-22 DIAGNOSIS — I10 Essential (primary) hypertension: Secondary | ICD-10-CM | POA: Diagnosis not present

## 2015-08-22 DIAGNOSIS — E11319 Type 2 diabetes mellitus with unspecified diabetic retinopathy without macular edema: Secondary | ICD-10-CM

## 2015-08-22 DIAGNOSIS — H2513 Age-related nuclear cataract, bilateral: Secondary | ICD-10-CM

## 2015-08-22 DIAGNOSIS — E113293 Type 2 diabetes mellitus with mild nonproliferative diabetic retinopathy without macular edema, bilateral: Secondary | ICD-10-CM | POA: Diagnosis not present

## 2015-08-22 LAB — HM DIABETES EYE EXAM

## 2015-08-24 ENCOUNTER — Ambulatory Visit (INDEPENDENT_AMBULATORY_CARE_PROVIDER_SITE_OTHER): Payer: Medicare Other | Admitting: Internal Medicine

## 2015-08-24 ENCOUNTER — Encounter: Payer: Self-pay | Admitting: Internal Medicine

## 2015-08-24 VITALS — BP 128/68 | HR 96 | Temp 97.9°F | Resp 12 | Wt 220.8 lb

## 2015-08-24 DIAGNOSIS — I255 Ischemic cardiomyopathy: Secondary | ICD-10-CM

## 2015-08-24 DIAGNOSIS — E139 Other specified diabetes mellitus without complications: Secondary | ICD-10-CM | POA: Diagnosis not present

## 2015-08-24 MED ORDER — GLIPIZIDE 10 MG PO TABS: 10.0000 mg | ORAL_TABLET | Freq: Two times a day (BID) | ORAL | Status: DC

## 2015-08-24 NOTE — Patient Instructions (Addendum)
Please continue: Lantus 40 units in am and 40 units at bedtime Humulin R U100: - 40 units (small meal)  - 44 units (larger meal)  For dinner, use 48-50 units.  For a low carb meal, use 30-34 units. Sliding scale Humulin insulin: - 150- 165: + 1 unit  - 166- 180: + 2 units  - 181- 195: + 3 units  - 196- 210: + 4 units  - >210: + 5 units   Add Glipizide 5 mg 2x a day before b'fast and dinner.  Please stop at the lab.  Please schedule an appt with Antonieta Iba with nutrition.  Please come back for a follow-up appointment in 3 months

## 2015-08-24 NOTE — Progress Notes (Addendum)
Patient ID: Danielle Vargas, female   DOB: 1951-08-12, 64 y.o.   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 Last visit 3 mo ago. She has [name].  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  Previous regimen: - Metformin 1000 mg bid  - U500 0.11-0.11-0.10 units before meals >> 0.09 mL tid  She has been on Glipizide in the past, too. She tried Invokana in the past >> severe diarrhea  She is now on: Lantus 40 units in am and 40 units at bedtime Humulin R U100: - 40 units (small meal)  - 44 units (larger meal)  For dinner, use 48-50 units.  For a low carb meal, use 30-34 units. Sliding scale Humulin insulin: - 150- 165: + 1 unit  - 166- 180: + 2 units  - 181- 195: + 3 units  - 196- 210: + 4 units  - >210: + 5 units   Pt checks her sugars 4x a day - brings meter >> downloaded: - am: 84-209 >> 91, 138-215, 250 >> 116-228 >> 116, 149-201, 247 >> 133-233, 318 >> 154-246, 257 - 2h after b'fast: 129-228 >> 136, 194-301 >> 134-246 >> 152-266 >> n/c - before lunch: 74-227 >> 151-269, 288 >> 104, 109-203, 244 >> 133-226 >> 149-302 >> 119, 167-318 - 2h after lunch: 165-209, 264 >> 93-223, 251 >> 188-222 >> 90-308 >> n/c - before dinner: 137, 142-229, 247 >> 146-239 >> 201-252 >> 71-246 >> 152-266, 315 - 2h after dinner: 174-267 >> 193-260, 322 >> 108-202, 266 >> 128-243 >> 184-235 >> n/c - bedtime:118, 163-275 >> 111-242, 300 >> 147-223 >> 133-300 >> 191-260, 289 - nighttime: 161-310 >> n/c >> 75-232 >> 136-291 >> n/c few lows -  lowest 71 >> 119; she has hypoglycemia awareness at 95.  Highest sugar was 300s.  Pt's meals are: - Breakfast: 2 slices of bread + PB - Lunch: sandwich;  salad + cheese + croutons;  baked potato >> pinto beans + cornbread - Dinner: leftovers or corn bread + pinto beans + occas. Cole slaw >> salad + lite dressing + saltines Not soft drinks anymore. Snacks at night  - has mild CKD, last BUN/creatinine:  Lab Results  Component Value Date   BUN 12 06/17/2015   CREATININE 1.09* 06/17/2015  She is back on Losartan. - pt has HL:  02/2015: TG 252 (PCP)  Lab Results  Component Value Date   CHOL 115 10/27/2013   HDL 21* 10/27/2013   LDLCALC 47 10/27/2013   TRIG 236* 10/27/2013   CHOLHDL 5.5 10/27/2013  She is on Fenofibrate. - last eye exam was in 08/22/2015 - Dr Zigmund Daniel. R>L DR.  - + numbness and tingling in her feet. On neurontin.  She also has a history of HTN, HL, fatty liver, GERD, morbid obesity, hypothyroidism, migraines, chronic back pain, history of nephrolithiasis, osteoporosis, diverticulosis. She was recently dx'ed with OSA >> on CPAP.  Since had pancreatitis (03/21/2015).   I reviewed pt's medications, allergies, PMH, social hx, family hx, and changes were documented in the history of present illness. Otherwise, unchanged from my initial visit note.  ROS: Constitutional: no weight  gain/loss, + fatigue, no subjective hyperthermia/hypothermia Eyes: no blurry vision, no xerophthalmia ENT: no sore throat, no nodules palpated in throat, no dysphagia/odynophagia, no hoarseness Cardiovascular: no CP/SOB/palpitations/leg swelling Respiratory: no cough/SOB/wheezing Gastrointestinal: no N/V/D/C Musculoskeletal: no muscle/joint aches Skin: no rashes Neurological: no tremors/numbness/tingling/dizziness  PE: BP 128/68 mmHg  Pulse 96  Temp(Src) 97.9 F (36.6 C) (Oral)  Resp 12  Wt 220 lb 12.8 oz (100.154 kg)  SpO2 96% Body mass index is 41.74 kg/(m^2). Wt Readings from Last 3  Encounters:  08/24/15 220 lb 12.8 oz (100.154 kg)  08/14/15 219 lb (99.338 kg)  07/17/15 215 lb (97.523 kg)   Constitutional: overweight, in NAD Eyes: PERRLA, EOMI, no exophthalmos ENT: moist mucous membranes, no thyromegaly, no cervical lymphadenopathy Cardiovascular: RRR, No MRG Respiratory: CTA B Gastrointestinal: abdomen soft, NT, ND, BS+ Musculoskeletal: no deformities, strength intact in all 4 Skin: moist, warm, no rashes  ASSESSMENT: 1. DM2, insulin-dependent, uncontrolled, with complications - CAD - AMI 10/26/2013 - had angioplasty, no stent placed. Had 2DEcho on 01/23/2014 EF increased (from 35) to 55%. - peripheral neuropathy, on Neurontin - Gastroparesis, on Reglan - Dr. 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 Patient with long-standing diabetes - sugars still above goal at most times of the day, in contrast with her fructosamine. Upon her request, we will try to add Glipizide back. However, we need more help with her meals >> refer to nutrition.  - She is on high doses of insulin and cannot afford U500...  -  I suggested:  Patient Instructions  Please continue: Lantus 40 units in am and 40 units at bedtime Humulin R U100: - 40 units (small meal)  - 44 units (larger meal)  For dinner, use 48-50 units.  For a low carb meal, use 30-34 units. Sliding scale Humulin insulin: - 150- 165: + 1 unit  - 166- 180: + 2 units  - 181- 195: + 3 units  - 196- 210: + 4 units  - >210: + 5 units   Add Glipizide 5 mg 2x a day before b'fast and dinner.  Please stop at the lab.  Please schedule an appt with Antonieta Iba with nutrition.  Please come back for a follow-up appointment in 3 months  - continue checking sugars at different times of the day and write them down - she is doing a great job with this - will check fructosamine today  - she needs to check sugars 4x a day as she is a complex insulin regimen  - Return to clinic in 3 mo with sugar log   Office Visit on  08/24/2015  Component Date Value Ref Range Status  . HM Diabetic Eye Exam 08/22/2015 Retinopathy* No Retinopathy Final   stable  . Fructosamine 08/24/2015 274* 190 - 270 umol/L Final   HbA1c calculated from fructosamine is 6.27%. However, this is not correlating to her documented sugars.Marland KitchenMarland Kitchen

## 2015-08-27 LAB — FRUCTOSAMINE: FRUCTOSAMINE: 274 umol/L — AB (ref 190–270)

## 2015-08-29 ENCOUNTER — Other Ambulatory Visit: Payer: Self-pay | Admitting: *Deleted

## 2015-08-29 MED ORDER — INSULIN REGULAR HUMAN 100 UNIT/ML IJ SOLN: INTRAMUSCULAR | Status: DC

## 2015-08-29 MED ORDER — INSULIN GLARGINE 100 UNIT/ML ~~LOC~~ SOLN: SUBCUTANEOUS | Status: DC

## 2015-08-29 NOTE — Telephone Encounter (Signed)
Pt requested refills of her insulin to be sent to [facility] today.

## 2015-08-30 ENCOUNTER — Telehealth: Payer: Self-pay | Admitting: Neurology

## 2015-08-30 MED ORDER — MOMETASONE FUROATE 50 MCG/ACT NA SUSP: 2.0000 | Freq: Every day | NASAL | Status: AC

## 2015-08-30 NOTE — Telephone Encounter (Signed)
Patient is calling because she has a runny nose and is unable to use her CPAP. When she lies down and uses the CPAP she cannot breathe. Can something be called in to [facility] on [address]? Thank you.

## 2015-08-30 NOTE — Telephone Encounter (Signed)
I have spoken with Danielle Vargas and per CD, advised that she stop using cpap for 2-3 days to give discharge time to resolve; that CD is sending a nasal spray to her pharmacy.  Danielle Vargas verbalized understanding of same/fim

## 2015-08-30 NOTE — Telephone Encounter (Signed)
Please tell Danielle Vargas to stop using the CPAP for 2 or 3 days that she can actually get rid of the discharge. I will call in a nasal spray, NASONEX or NASOCORT- not a decongestant.CD

## 2015-09-21 ENCOUNTER — Encounter: Payer: Medicare Other | Attending: Internal Medicine | Admitting: Dietician

## 2015-09-21 ENCOUNTER — Encounter: Payer: Self-pay | Admitting: Dietician

## 2015-09-21 VITALS — Ht 61.0 in | Wt 221.0 lb

## 2015-09-21 DIAGNOSIS — Z713 Dietary counseling and surveillance: Secondary | ICD-10-CM | POA: Insufficient documentation

## 2015-09-21 DIAGNOSIS — Z6841 Body Mass Index (BMI) 40.0 and over, adult: Secondary | ICD-10-CM | POA: Insufficient documentation

## 2015-09-21 DIAGNOSIS — E139 Other specified diabetes mellitus without complications: Secondary | ICD-10-CM | POA: Diagnosis present

## 2015-09-21 DIAGNOSIS — E669 Obesity, unspecified: Secondary | ICD-10-CM | POA: Diagnosis not present

## 2015-09-21 NOTE — Progress Notes (Signed)
Diabetes Self-Management Education  Visit Type: First/Initial  Appt. Start Time: 1345 Appt. End Time: [id number]  09/21/2015  [name], identified by name and date of birth, is a 65 y.o. female with a diagnosis of Diabetes: Type 1 (MODY).   She reports being on insulin for the past 3 years.  Currently she is consistently taking 40 units Lantus at 10 am and 1 am, Humulin 100 45-46 units prior to each meal (10 am, 4 pm and 10 pm and metformin 1000 mg bid.  She is not taking the glipizide as her blood sugar improved with diet change.  She does not take the R500 due to expense.  She states that in the past month she stopped eating Goldfish and eating cereal and her blood sugar has improved since. ' "I know what I am doing wrong but don't know if education will help but thought that I would give it a try anyway."  "I eat too much butter and cheese." She states that she is limited with exercise do to orthopedic problems but notices a lower blood sugar for a couple days after she keeps her grandchildren as she is more active.  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 She reports receiving steriod shots in her back for months but none in the month of December and does not feel that she will continue this as it was not helping much.  She drinks high amounts of diet Mt. Dew and changed from diet coke which she drank for years due to a sudden change in her taste buds.  Other hx includes gastroparesis since 2006, dyslipidemia, CAD, sleep apnea (uses c-pap and notices improved glucose control when she is able to use this), HTN, and fatty liver.  Patient lives alone and is on disability.  She eats out infrequently.  She eats very little meat.   ASSESSMENT  Height 5\' 1"  (1.549 m), weight 221 lb (100.245 kg). Body mass index is 41.78 kg/(m^2).      Diabetes Self-Management Education - 09/21/15 1402    Visit Information   Visit Type  First/Initial   Initial Visit   Diabetes Type Type 1  MODY   Are you currently following a meal plan? No   Are you taking your medications as prescribed? Yes   Date Diagnosed 1999  Insulin for the past 3 years   Health Coping   How would you rate your overall health? Fair   Psychosocial Assessment   Patient Belief/Attitude about Diabetes Motivated to manage diabetes   Self-care barriers None   Self-management support Doctor's office   Other persons present Patient   Patient Concerns Nutrition/Meal planning   Special Needs None   Preferred Learning Style No preference indicated   Learning Readiness Contemplating   How often do you need to have someone help you when you read instructions, pamphlets, or other written materials from your doctor or pharmacy? 1 - Never   What is the last grade level you completed in school? 2 years college   Complications   Last HgB A1C per patient/outside source --  Fructosamide:  274:  12/16   Fasting Blood glucose range (mg/dL) 180-200   Postprandial Blood glucose range (mg/dL) 130-179   Number of hypoglycemic episodes per month 0   Number of hyperglycemic episodes per week 9   Have you had a dilated eye exam in the past 12 months? Yes   Have you had a  dental exam in the past 12 months? Yes   Are you checking your feet? Yes   How many days per week are you checking your feet? 6   Dietary Intake   Breakfast steel cut oats with nuts and cranberries (instant with sugar) OR 2 litght wheat cheese toast OR scrambled egg with toast with butter  9-10   Snack (morning) none   Lunch none   Snack (afternoon) none   Dinner cornbread, brussel sprouts, canned pinto beans OR vegetable beef soup OR chicken and rice soup OR baked potato or sweet potato  4-5   Snack (evening) baked potato   Beverage(s) diet Mt. Dew "all day long", water, hot tea with miiralax, occasional coffee   Exercise   Exercise Type ADL's  pain with back and hip and weeze   How many days  per week to you exercise? 0   How many minutes per day do you exercise? 0   Total minutes per week of exercise 0   Patient Education   Previous Diabetes Education Yes (please comment)  in the past and reads a lot   Disease state  Other (comment)  LADA   Nutrition management  Role of diet in the treatment of diabetes and the relationship between the three main macronutrients and blood glucose level;Food label reading, portion sizes and measuring food.;Carbohydrate counting;Meal options for control of blood glucose level and chronic complications.   Physical activity and exercise  Role of exercise on diabetes management, blood pressure control and cardiac health.;Helped patient identify appropriate exercises in relation to his/her diabetes, diabetes complications and other health issue.   Monitoring Identified appropriate SMBG and/or A1C goals.   Chronic complications Relationship between chronic complications and blood glucose control;Identified and discussed with patient  current chronic complications   Psychosocial adjustment Role of stress on diabetes   Individualized Goals (developed by patient)   Nutrition Follow meal plan discussed;General guidelines for healthy choices and portions discussed   Physical Activity Exercise 1-2 times per week;15 minutes per day   Medications take my medication as prescribed   Monitoring  test my blood glucose as discussed   Health Coping Not Applicable   Outcomes   Expected Outcomes Other (comment)  demonstrated interest in learning but patient states she is not sure she wants to change or is able to   Future DMSE PRN   Program Status Completed      Individualized Plan for Diabetes Self-Management Training:   Learning Objective:  Patient will have a greater understanding of diabetes self-management. Patient education plan is to attend individual and/or group sessions per assessed needs and concerns.   Plan:   Patient Instructions   Aim for 2-3  Carb Choices per meal (30-45 grams) +/- 1 either way  Aim for 0-1 Carbs per snack if hungry  Include protein in moderation with your meals and snacks Consider reading food labels for Total Carbohydrate and Fat Grams of foods Consider  increasing your activity level by silver sneakers class or walking or armchair for 15-30 minutes daily as tolerated Consider checking BG at alternate times per day as directed by MD  Increase the variety in your diet (nonstarchy vegetables, and small portions fruit)   Expected Outcomes:  Other (comment) (demonstrated interest in learning but patient states she is not sure she wants to change or is able to) Education material provided: Living Well with Diabetes, Food label handouts, Meal plan card and My Plate If problems or questions, patient to contact team  via:  Phone and Email Future DSME appointment: PRN

## 2015-09-21 NOTE — Patient Instructions (Addendum)
  Aim for 2-3 Carb Choices per meal (30-45 grams) +/- 1 either way  Aim for 0-1 Carbs per snack if hungry  Include protein in moderation with your meals and snacks Consider reading food labels for Total Carbohydrate and Fat Grams of foods Consider  increasing your activity level by silver sneakers class or walking or armchair for 15-30 minutes daily as tolerated Consider checking BG at alternate times per day as directed by MD  Increase the variety in your diet (nonstarchy vegetables, and small portions fruit)

## 2015-10-08 ENCOUNTER — Telehealth: Payer: Self-pay | Admitting: Internal Medicine

## 2015-10-08 MED ORDER — INSULIN GLARGINE 100 UNIT/ML ~~LOC~~ SOLN: SUBCUTANEOUS | Status: DC

## 2015-10-08 NOTE — Telephone Encounter (Signed)
Pt called in and would like her insulin, Lantus, called into her mail order pharmacy. It has to be a 90 day supply. [facility]  Fax # [phone]  Pt would also like to speak with [facility].

## 2015-10-08 NOTE — Telephone Encounter (Signed)
Refill sent to pt's pharmacy. Pt had a local pharmacy change. Advised of the qty dispensed and that the rx was sent to [facility].

## 2015-10-22 ENCOUNTER — Ambulatory Visit (INDEPENDENT_AMBULATORY_CARE_PROVIDER_SITE_OTHER): Payer: Medicare Other | Admitting: Sports Medicine

## 2015-10-22 ENCOUNTER — Encounter: Payer: Self-pay | Admitting: Sports Medicine

## 2015-10-22 DIAGNOSIS — M79676 Pain in unspecified toe(s): Secondary | ICD-10-CM

## 2015-10-22 DIAGNOSIS — B351 Tinea unguium: Secondary | ICD-10-CM | POA: Diagnosis not present

## 2015-10-22 DIAGNOSIS — E119 Type 2 diabetes mellitus without complications: Secondary | ICD-10-CM | POA: Diagnosis not present

## 2015-10-22 NOTE — Progress Notes (Signed)
Patient ID: Danielle Vargas, female   DOB: 03-Oct-1950, 65 y.o.   MRN: [id number] Subjective: Danielle Vargas is a 65 y.o. female patient with history of type 2 diabetes who presents to office today complaining of long, painful nails  while ambulating in shoes; unable to trim. Patient states that the glucose reading this morning was 197 mg/dl. Patient denies any new changes in medication or new problems. Patient denies any new cramping, numbness, burning or tingling in the legs.  Patient Active Problem List   Diagnosis Date Noted  . 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 UTI (urinary tract infection) 06/16/2015  . Diabetes mellitus ([place]) 06/16/2015  . RUQ abdominal pain 04/17/2015  . Hx of colonic polyps 04/17/2015  . Family hx of colon cancer 04/17/2015  . CPAP rhinitis 12/08/2014  . OSA on CPAP 12/08/2014  . 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 Retinopathy due to secondary diabetes mellitus, without macular edema, with moderate nonproliferative retinopathy ([place]) 09/18/2014  . Neuropathy due to secondary diabetes mellitus ([place]) 09/18/2014  . Diabetes mellitus due to underlying condition with other diabetic neurological complication ([place]) [id number]  . Obesity hypoventilation syndrome ([place]) 09/18/2014  . CAD in native artery 01/23/2014  . Morbid obesity ([place]) 01/23/2014  . Renal insufficiency 11/17/2013  . HTN (hypertension) 10/29/2013  . Chronic diastolic heart failure ([place]) 10/29/2013  . 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 LADA (latent autoimmune diabetes in adults), managed as type 1 ([place]) 08/08/2013   Current Outpatient Prescriptions on File Prior to Visit  Medication Sig Dispense Refill  . albuterol (PROVENTIL HFA;VENTOLIN HFA) 108 (90 BASE) MCG/ACT inhaler Inhale 2 puffs into the lungs every 6 (six) hours as needed for wheezing or shortness of breath.     Marland Kitchen aspirin EC 81 MG tablet Take 81 mg by mouth daily.     . budesonide-formoterol (SYMBICORT) 160-4.5 MCG/ACT inhaler  Inhale 2 puffs into the lungs 2 (two) times daily as needed (shortness of breath).    . carisoprodol (SOMA) 350 MG tablet Take 350 mg by mouth 4 (four) times daily.     . carvedilol (COREG) 12.5 MG tablet Take 1 tablet (12.5 mg total) by mouth 2 (two) times daily with a meal. 180 tablet 3  . Coenzyme Q10 (CO Q 10) 100 MG CAPS Take 100 mg by mouth 2 (two) times daily.     . CRESTOR 20 MG tablet TAKE 1 TABLET EVERY DAY (Patient taking differently: TAKE 1 TABLET EVERY DAY AT BEDTIME) 90 tablet 1  . cyclobenzaprine (FLEXERIL) 10 MG tablet Reported on 09/21/2015    . fenofibrate 160 MG tablet Take 160 mg by mouth at bedtime.     . furosemide (LASIX) 20 MG tablet Take 1 tablet (20 mg total) by mouth every morning. (Patient taking differently: Take 20 mg by mouth daily. ) 90 tablet 3  . gabapentin (NEURONTIN) 300 MG capsule Take 300 mg by mouth 3 (three) times daily.    Marland Kitchen glipiZIDE (GLUCOTROL) 10 MG tablet Take 1 tablet (10 mg total) by mouth 2 (two) times daily before a meal. (Patient not taking: Reported on 09/21/2015) 60 tablet 3  . HUMULIN R 500 UNIT/ML injection Reported on 09/21/2015  0  . HYDROcodone-acetaminophen (NORCO) 10-325 MG tablet Take 1 tablet by mouth 3 (three) times daily as needed (pain).   0  . insulin glargine (LANTUS) 100 UNIT/ML injection Inject into the skin 40 units in the morning and 40 units at bedtime. 90 mL 1  . insulin regular (HUMULIN R) 100 units/mL  injection Inject into the skin 40 units with a small meal and 44 units with a larger meal; 3 times daily. Plus sliding scale. 120 mL 1  . levothyroxine (SYNTHROID, LEVOTHROID) 75 MCG tablet Take 75 mcg by mouth daily.     Marland Kitchen losartan (COZAAR) 50 MG tablet Take 50 mg by mouth 2 (two) times daily.     . metFORMIN (GLUCOPHAGE) 1000 MG tablet Take 1,000 mg by mouth 2 (two) times daily with a meal.    . metoCLOPramide (REGLAN) 10 MG tablet Take 10 mg by mouth 4 (four) times daily.     . mometasone (NASONEX) 50 MCG/ACT nasal spray Place 2  sprays into the nose daily. 17 g 12  . montelukast (SINGULAIR) 10 MG tablet Take 10 mg by mouth at bedtime.    . Multiple Vitamin (MULTIVITAMIN WITH MINERALS) TABS tablet Take 1 tablet by mouth daily. One A Day 50+    . nitroGLYCERIN (NITROSTAT) 0.4 MG SL tablet Place 1 tablet (0.4 mg total) under the tongue every 5 (five) minutes as needed for chest pain. (Patient not taking: Reported on 09/21/2015) 25 tablet 3  . omeprazole (PRILOSEC) 20 MG capsule Take 20 mg by mouth 2 (two) times daily before a meal.    . polyethylene glycol (MIRALAX / GLYCOLAX) packet Take 17 g by mouth every other day.     . topiramate (TOPAMAX) 50 MG tablet Take 50 mg by mouth 2 (two) times daily.    . Vitamin D, Ergocalciferol, (DRISDOL) 50000 UNITS CAPS Take 50,000 Units by mouth every 7 (seven) days. Thursdays     No current facility-administered medications on file prior to visit.   Allergies  Allergen Reactions  . Invokana [Canagliflozin] Diarrhea  . Nsaids Other (See Comments)    GI problems/ stomach pain  . Penicillins Rash    Has patient had a PCN reaction causing immediate rash, facial/tongue/throat swelling, SOB or lightheadedness with hypotension: No (rash on legs after 3 days [place] -June 2016) Has patient had a PCN reaction causing severe rash involving mucus membranes or skin necrosis: No Has patient had a PCN reaction that required hospitalization No Has patient had a PCN reaction occurring within the last 10 years: Yes If all of the above answers are "NO", then may proceed with Cephalosporin use.   Labs: HEMOGLOBIN A1C, 6.2, per patient report  Objective: General: Patient is awake, alert, and oriented x 3 and in no acute distress.  Integument: Skin is warm, dry and supple bilateral. Nails are tender, long, thickened and  dystrophic with subungual debris, consistent with onychomycosis, 1-5 bilateral. No signs of infection. No open lesions or preulcerative lesions present bilateral. Remaining integument  unremarkable.  Vasculature:  Dorsalis Pedis pulse 2/4 bilateral. Posterior Tibial pulse  1/4 bilateral.  Capillary fill time <3 sec 1-5 bilateral. Scant hair growth to the level of the digits. Temperature gradient within normal limits. No varicosities present bilateral. No edema present bilateral.   Neurology: The patient has intact sensation measured with a 5.07/10g Semmes Weinstein Monofilament at all pedal sites bilateral . Vibratory sensation intact bilateral with tuning fork. No Babinski sign present bilateral.   Musculoskeletal: No gross pedal deformities noted bilateral. Muscular strength 5/5 in all lower extremity muscular groups bilateral without pain or limitation on range of motion . No tenderness with calf compression bilateral.  Assessment and Plan: Problem List Items Addressed This Visit    None    Visit Diagnoses    Dermatophytosis of nail    -  Primary    Pain of toe, unspecified laterality        Diabetes mellitus without complication ([place])          -Examined patient. -Discussed and educated patient on diabetic foot care, especially with  regards to the vascular, neurological and musculoskeletal systems.  -Stressed the importance of good glycemic control and the detriment of not  controlling glucose levels in relation to the foot. -Mechanically debrided all nails 1-5 bilateral using sterile nail nipper and filed with dremel without incident  -Answered all patient questions -Patient to return  in 3 months for at risk foot care -Patient advised to call the office if any problems or questions arise in the meantime.  Landis Martins, DPM

## 2015-11-10 ENCOUNTER — Observation Stay (HOSPITAL_COMMUNITY)
Admission: EM | Admit: 2015-11-10 | Discharge: 2015-11-10 | Disposition: A | Discharge status: Home / Self Care | Payer: Medicare Other | Attending: Internal Medicine | Admitting: Internal Medicine

## 2015-11-10 ENCOUNTER — Emergency Department (HOSPITAL_COMMUNITY): Payer: Medicare Other

## 2015-11-10 ENCOUNTER — Encounter (HOSPITAL_COMMUNITY): Payer: Self-pay

## 2015-11-10 DIAGNOSIS — E785 Hyperlipidemia, unspecified: Secondary | ICD-10-CM | POA: Diagnosis not present

## 2015-11-10 DIAGNOSIS — Z9989 Dependence on other enabling machines and devices: Secondary | ICD-10-CM

## 2015-11-10 DIAGNOSIS — I252 Old myocardial infarction: Secondary | ICD-10-CM | POA: Diagnosis not present

## 2015-11-10 DIAGNOSIS — G4730 Sleep apnea, unspecified: Secondary | ICD-10-CM | POA: Insufficient documentation

## 2015-11-10 DIAGNOSIS — G43909 Migraine, unspecified, not intractable, without status migrainosus: Secondary | ICD-10-CM | POA: Diagnosis not present

## 2015-11-10 DIAGNOSIS — Z87442 Personal history of urinary calculi: Secondary | ICD-10-CM | POA: Insufficient documentation

## 2015-11-10 DIAGNOSIS — I2510 Atherosclerotic heart disease of native coronary artery without angina pectoris: Secondary | ICD-10-CM | POA: Insufficient documentation

## 2015-11-10 DIAGNOSIS — I5032 Chronic diastolic (congestive) heart failure: Secondary | ICD-10-CM | POA: Diagnosis present

## 2015-11-10 DIAGNOSIS — Z794 Long term (current) use of insulin: Secondary | ICD-10-CM | POA: Diagnosis not present

## 2015-11-10 DIAGNOSIS — E039 Hypothyroidism, unspecified: Secondary | ICD-10-CM | POA: Diagnosis not present

## 2015-11-10 DIAGNOSIS — Z7982 Long term (current) use of aspirin: Secondary | ICD-10-CM | POA: Diagnosis not present

## 2015-11-10 DIAGNOSIS — G4710 Hypersomnia, unspecified: Secondary | ICD-10-CM | POA: Diagnosis not present

## 2015-11-10 DIAGNOSIS — R079 Chest pain, unspecified: Principal | ICD-10-CM | POA: Insufficient documentation

## 2015-11-10 DIAGNOSIS — I10 Essential (primary) hypertension: Secondary | ICD-10-CM | POA: Insufficient documentation

## 2015-11-10 DIAGNOSIS — K3184 Gastroparesis: Secondary | ICD-10-CM | POA: Diagnosis not present

## 2015-11-10 DIAGNOSIS — Z880 Allergy status to penicillin: Secondary | ICD-10-CM | POA: Diagnosis not present

## 2015-11-10 DIAGNOSIS — E669 Obesity, unspecified: Secondary | ICD-10-CM | POA: Diagnosis not present

## 2015-11-10 DIAGNOSIS — F329 Major depressive disorder, single episode, unspecified: Secondary | ICD-10-CM | POA: Diagnosis not present

## 2015-11-10 DIAGNOSIS — Z9981 Dependence on supplemental oxygen: Secondary | ICD-10-CM | POA: Insufficient documentation

## 2015-11-10 DIAGNOSIS — Z86010 Personal history of colonic polyps: Secondary | ICD-10-CM | POA: Diagnosis not present

## 2015-11-10 DIAGNOSIS — E119 Type 2 diabetes mellitus without complications: Secondary | ICD-10-CM | POA: Insufficient documentation

## 2015-11-10 DIAGNOSIS — J45909 Unspecified asthma, uncomplicated: Secondary | ICD-10-CM | POA: Diagnosis not present

## 2015-11-10 DIAGNOSIS — E1310 Other specified diabetes mellitus with ketoacidosis without coma: Secondary | ICD-10-CM

## 2015-11-10 DIAGNOSIS — Z7951 Long term (current) use of inhaled steroids: Secondary | ICD-10-CM | POA: Insufficient documentation

## 2015-11-10 DIAGNOSIS — Z79899 Other long term (current) drug therapy: Secondary | ICD-10-CM | POA: Insufficient documentation

## 2015-11-10 DIAGNOSIS — E109 Type 1 diabetes mellitus without complications: Secondary | ICD-10-CM

## 2015-11-10 DIAGNOSIS — G4733 Obstructive sleep apnea (adult) (pediatric): Secondary | ICD-10-CM

## 2015-11-10 LAB — BASIC METABOLIC PANEL
ANION GAP: 14 (ref 5–15)
BUN: 15 mg/dL (ref 6–20)
CALCIUM: 9.3 mg/dL (ref 8.9–10.3)
CO2: 19 mmol/L — ABNORMAL LOW (ref 22–32)
Chloride: 107 mmol/L (ref 101–111)
Creatinine, Ser: 0.95 mg/dL (ref 0.44–1.00)
GFR calc Af Amer: >60 mL/min (ref >60)
Glucose, Bld: 205 mg/dL — ABNORMAL HIGH (ref 65–99)
POTASSIUM: 4.4 mmol/L (ref 3.5–5.1)
SODIUM: 140 mmol/L (ref 135–145)

## 2015-11-10 LAB — LIPID PANEL
CHOLESTEROL: 131 mg/dL (ref 0–200)
HDL: 31 mg/dL — AB (ref >40)
LDL Cholesterol: 51 mg/dL (ref 0–99)
TRIGLYCERIDES: 244 mg/dL — AB (ref <150)
Total CHOL/HDL Ratio: 4.2 RATIO
VLDL: 49 mg/dL — ABNORMAL HIGH (ref 0–40)

## 2015-11-10 LAB — CBC
HEMATOCRIT: 34.1 % — AB (ref 36.0–46.0)
HEMOGLOBIN: 10.2 g/dL — AB (ref 12.0–15.0)
MCH: 25.9 pg — ABNORMAL LOW (ref 26.0–34.0)
MCHC: 29.9 g/dL — ABNORMAL LOW (ref 30.0–36.0)
MCV: 86.5 fL (ref 78.0–100.0)
Platelets: 302 10*3/uL (ref 150–400)
RBC: 3.94 MIL/uL (ref 3.87–5.11)
RDW: 15.3 % (ref 11.5–15.5)
WBC: 7.2 10*3/uL (ref 4.0–10.5)

## 2015-11-10 LAB — GLUCOSE, CAPILLARY
GLUCOSE-CAPILLARY: 180 mg/dL — AB (ref 65–99)
GLUCOSE-CAPILLARY: 149 mg/dL — AB (ref 65–99)

## 2015-11-10 LAB — I-STAT TROPONIN, ED: TROPONIN I, POC: 0 ng/mL (ref 0.00–0.08)

## 2015-11-10 LAB — TROPONIN I

## 2015-11-10 MED ORDER — GI COCKTAIL ~~LOC~~
30.0000 mL | Freq: Four times a day (QID) | ORAL | Status: DC | PRN
Start: 2015-11-10 — End: 2015-11-10

## 2015-11-10 MED ORDER — ALBUTEROL SULFATE (2.5 MG/3ML) 0.083% IN NEBU: 3.0000 mL | INHALATION_SOLUTION | Freq: Four times a day (QID) | RESPIRATORY_TRACT | Status: DC | PRN

## 2015-11-10 MED ORDER — NITROGLYCERIN 0.4 MG SL SUBL
0.4000 mg | SUBLINGUAL_TABLET | SUBLINGUAL | Status: DC | PRN
  Administered 2015-11-10: 0.4 mg via SUBLINGUAL
  Filled 2015-11-10: qty 1

## 2015-11-10 MED ORDER — FUROSEMIDE 20 MG PO TABS
20.0000 mg | ORAL_TABLET | Freq: Every day | ORAL | Status: DC
  Administered 2015-11-10: 20 mg via ORAL
  Filled 2015-11-10: qty 1

## 2015-11-10 MED ORDER — TOPIRAMATE 25 MG PO TABS
50.0000 mg | ORAL_TABLET | Freq: Two times a day (BID) | ORAL | Status: DC
  Administered 2015-11-10: 50 mg via ORAL
  Filled 2015-11-10: qty 2

## 2015-11-10 MED ORDER — LEVOTHYROXINE SODIUM 75 MCG PO TABS
75.0000 ug | ORAL_TABLET | Freq: Every day | ORAL | Status: DC
  Administered 2015-11-10: 75 ug via ORAL
  Filled 2015-11-10: qty 1

## 2015-11-10 MED ORDER — ROSUVASTATIN CALCIUM 10 MG PO TABS
20.0000 mg | ORAL_TABLET | Freq: Every day | ORAL | Status: DC
  Filled 2015-11-10: qty 2

## 2015-11-10 MED ORDER — INSULIN ASPART 100 UNIT/ML ~~LOC~~ SOLN: 40.0000 [IU] | Freq: Once | SUBCUTANEOUS | Status: DC

## 2015-11-10 MED ORDER — ACETAMINOPHEN 325 MG PO TABS: 650.0000 mg | ORAL_TABLET | ORAL | Status: DC | PRN

## 2015-11-10 MED ORDER — ADULT MULTIVITAMIN W/MINERALS CH
1.0000 | ORAL_TABLET | Freq: Every day | ORAL | Status: DC
  Administered 2015-11-10: 1 via ORAL
  Filled 2015-11-10: qty 1

## 2015-11-10 MED ORDER — CARVEDILOL 12.5 MG PO TABS
12.5000 mg | ORAL_TABLET | Freq: Two times a day (BID) | ORAL | Status: DC
  Administered 2015-11-10: 12.5 mg via ORAL
  Filled 2015-11-10: qty 1

## 2015-11-10 MED ORDER — MONTELUKAST SODIUM 10 MG PO TABS: 10.0000 mg | ORAL_TABLET | Freq: Every day | ORAL | Status: DC

## 2015-11-10 MED ORDER — INSULIN ASPART 100 UNIT/ML ~~LOC~~ SOLN
30.0000 [IU] | Freq: Once | SUBCUTANEOUS | Status: AC
  Administered 2015-11-10: 30 [IU] via SUBCUTANEOUS

## 2015-11-10 MED ORDER — FLUTICASONE PROPIONATE 50 MCG/ACT NA SUSP
1.0000 | Freq: Every day | NASAL | Status: DC
  Filled 2015-11-10: qty 16

## 2015-11-10 MED ORDER — ENOXAPARIN SODIUM 40 MG/0.4ML ~~LOC~~ SOLN
40.0000 mg | SUBCUTANEOUS | Status: DC
  Administered 2015-11-10: 40 mg via SUBCUTANEOUS
  Filled 2015-11-10: qty 0.4

## 2015-11-10 MED ORDER — CO Q 10 100 MG PO CAPS: 100.0000 mg | ORAL_CAPSULE | Freq: Two times a day (BID) | ORAL | Status: DC

## 2015-11-10 MED ORDER — FENOFIBRATE 160 MG PO TABS: 160.0000 mg | ORAL_TABLET | Freq: Every day | ORAL | Status: DC

## 2015-11-10 MED ORDER — GI COCKTAIL ~~LOC~~
30.0000 mL | Freq: Once | ORAL | Status: AC
  Administered 2015-11-10: 30 mL via ORAL
  Filled 2015-11-10: qty 30

## 2015-11-10 MED ORDER — MORPHINE SULFATE (PF) 2 MG/ML IV SOLN: 2.0000 mg | INTRAVENOUS | Status: DC | PRN

## 2015-11-10 MED ORDER — INSULIN GLARGINE 100 UNIT/ML ~~LOC~~ SOLN
40.0000 [IU] | Freq: Every day | SUBCUTANEOUS | Status: DC
  Filled 2015-11-10: qty 0.4

## 2015-11-10 MED ORDER — INSULIN ASPART 100 UNIT/ML ~~LOC~~ SOLN
20.0000 [IU] | Freq: Once | SUBCUTANEOUS | Status: AC
  Administered 2015-11-10: 20 [IU] via SUBCUTANEOUS

## 2015-11-10 MED ORDER — INSULIN GLARGINE 100 UNIT/ML ~~LOC~~ SOLN
30.0000 [IU] | Freq: Two times a day (BID) | SUBCUTANEOUS | Status: DC
  Administered 2015-11-10: 30 [IU] via SUBCUTANEOUS
  Filled 2015-11-10 (×2): qty 0.3

## 2015-11-10 MED ORDER — POLYETHYLENE GLYCOL 3350 17 G PO PACK: 17.0000 g | PACK | ORAL | Status: DC

## 2015-11-10 MED ORDER — HYDROCODONE-ACETAMINOPHEN 10-325 MG PO TABS
1.0000 | ORAL_TABLET | Freq: Three times a day (TID) | ORAL | Status: DC | PRN
  Administered 2015-11-10: 1 via ORAL
  Filled 2015-11-10: qty 1

## 2015-11-10 MED ORDER — ONDANSETRON HCL 4 MG/2ML IJ SOLN: 4.0000 mg | Freq: Four times a day (QID) | INTRAMUSCULAR | Status: DC | PRN

## 2015-11-10 MED ORDER — MOMETASONE FURO-FORMOTEROL FUM 200-5 MCG/ACT IN AERO
2.0000 | INHALATION_SPRAY | Freq: Two times a day (BID) | RESPIRATORY_TRACT | Status: DC
  Filled 2015-11-10 (×2): qty 8.8

## 2015-11-10 MED ORDER — INSULIN ASPART 100 UNIT/ML ~~LOC~~ SOLN: 0.0000 [IU] | Freq: Every day | SUBCUTANEOUS | Status: DC

## 2015-11-10 MED ORDER — GABAPENTIN 300 MG PO CAPS
300.0000 mg | ORAL_CAPSULE | Freq: Three times a day (TID) | ORAL | Status: DC
  Administered 2015-11-10: 300 mg via ORAL
  Filled 2015-11-10: qty 1

## 2015-11-10 MED ORDER — METOCLOPRAMIDE HCL 10 MG PO TABS
10.0000 mg | ORAL_TABLET | Freq: Three times a day (TID) | ORAL | Status: DC
  Administered 2015-11-10 (×2): 10 mg via ORAL
  Filled 2015-11-10 (×2): qty 1

## 2015-11-10 MED ORDER — INSULIN ASPART 100 UNIT/ML ~~LOC~~ SOLN
0.0000 [IU] | Freq: Three times a day (TID) | SUBCUTANEOUS | Status: DC
  Administered 2015-11-10: 3 [IU] via SUBCUTANEOUS

## 2015-11-10 MED ORDER — LOSARTAN POTASSIUM 50 MG PO TABS
50.0000 mg | ORAL_TABLET | Freq: Two times a day (BID) | ORAL | Status: DC
  Administered 2015-11-10: 50 mg via ORAL
  Filled 2015-11-10: qty 1

## 2015-11-10 NOTE — ED Notes (Signed)
Patient transported to X-ray 

## 2015-11-10 NOTE — ED Notes (Signed)
Per EMS pt states she was watching TV about 2 hours ago and started to have central chest pain like pressure; Pain is intermit; pt states hx of MI  In Feb. 2015; Pt a&o x 4 on arrival. Pt denies pain on arrival; Pt sinus on monitor on arrival

## 2015-11-10 NOTE — H&P (Signed)
[facility] History and Physical  Danielle Vargas [id number] DOB: [birth year] DOA: 11/10/2015  Referring physician: carryover PCP: Marton Redwood, MD   Chief Complaint: chest pain  HPI: Danielle Vargas is a 65 y.o. female with a past medical history that includes morbid obesity, dyslipidemia, hypertension, hypothyroidism, fatty liver, CAD with MI in 2015 status post cardiac cath, sleep apnea presents to the emergency department with chief complaint of chest pain. Being admitted to rule out  Permission is obtained from the patient. She reports sitting in chair watching TV last evening developed sudden sharp stabbing pain left anterior chest. Reports nonradiating. The pain a 8 out of 10 lasting several minutes. She denies diaphoresis shortness of breath nausea vomiting headache dizziness syncope or near-syncope. She does report she also developed some chest "tightness". He reports the pain remained intermittent throughout the night about 2 AM called 911. She was given 81 mg of aspirin 4 with minimal relief.  In the emergency department she's afebrile hemodynamically stable his blood pressure slightly elevated. She is not hypoxic. She was pain-free on arrival   Review of Systems:  10 point review of systems complete and all systems are negative except as indicated in the history of present illness  Past Medical History  Diagnosis Date  . DM (diabetes mellitus) ([facility])   . Obesity   . Dyslipidemia   . HTN (hypertension)   . Hypothyroidism   . Gastroparesis   . Migraine   . Osteoporosis   . Colon polyps 7/07    hyperplastic and adenomatous  . Nephrolithiasis   . Diverticulosis   . External hemorrhoid   . Fatty liver   . Asthma   . CAD (coronary artery disease)     a. 12/2008 Cath: mild irregs throughout, EF 75%.  . 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 Allergy   . Cataract     "just beginning"  . Myocardial infarction ([place])     FEb 2015  . Depression     pt unsure,  psychologist said no  . Sleep apnea     wears CPAP  . Hyperlipidemia    Past Surgical History  Procedure Laterality Date  . Tubal ligation  '85  . Breast reduction surgery  '88  . Cervical disc surgery  '98    fusion  . Carpal tunnel release  2000    right wrist  . Rotator cuff repair  '09    left shoulder  . Ulnar nerve repair  '02, '08    left done, then right  . Cesarean section  '79  '84  . Left heart catheterization with coronary angiogram N/A 10/26/2013    Procedure: LEFT HEART CATHETERIZATION WITH CORONARY ANGIOGRAM;  Surgeon: Sinclair Grooms, MD;  Location: [facility] CATH LAB;  Service: Cardiovascular;  Laterality: N/A;  . Colonoscopy     Social History:  reports that she has never smoked. She has never used smokeless tobacco. She reports that she does not drink alcohol or use illicit drugs.  Allergies  Allergen Reactions  . Invokana [Canagliflozin] Diarrhea  . Nsaids Other (See Comments)    GI problems/ stomach pain  . Penicillins Rash    Has patient had a PCN reaction causing immediate rash, facial/tongue/throat swelling, SOB or lightheadedness with hypotension: No (rash on legs after 3 days [place] -June 2016) Has patient had a PCN reaction causing severe rash involving mucus membranes or skin necrosis: No Has patient had a PCN reaction that required hospitalization No Has patient had a PCN  reaction occurring within the last 10 years: Yes If all of the above answers are "NO", then may proceed with Cephalosporin use.    Family History  Problem Relation Age of Onset  . COPD Father   . Stroke Father   . Emphysema Father   . Heart failure Brother   . Heart failure Mother   . Heart attack Mother     several  . Diabetes Mother   . Kidney disease Mother   . Hypertension Brother   . Colon cancer  45    mat. 1st cousin  . Other      celiac sprue, 1/2 brother  . Diabetes Brother     x 3  . Lung cancer Brother   . Cirrhosis Sister     liver transplant  . Liver disease  Sister     transplant  . Heart attack Paternal Grandfather   . Heart attack Brother   . Esophageal cancer Neg Hx   . Stomach cancer Neg Hx   . Rectal cancer Neg Hx   . Colon cancer Paternal Aunt 80     Prior to Admission medications   Medication Sig Start Date End Date Taking? Authorizing Provider  albuterol (PROVENTIL HFA;VENTOLIN HFA) 108 (90 BASE) MCG/ACT inhaler Inhale 2 puffs into the lungs every 6 (six) hours as needed for wheezing or shortness of breath.    Yes Historical Provider, MD  aspirin EC 81 MG tablet Take 81 mg by mouth daily.    Yes Historical Provider, MD  budesonide-formoterol (SYMBICORT) 160-4.5 MCG/ACT inhaler Inhale 2 puffs into the lungs 2 (two) times daily as needed (shortness of breath).   Yes Historical Provider, MD  carisoprodol (SOMA) 350 MG tablet Take 350 mg by mouth 4 (four) times daily.    Yes Historical Provider, MD  carvedilol (COREG) 12.5 MG tablet Take 1 tablet (12.5 mg total) by mouth 2 (two) times daily with a meal. 11/13/14  Yes Belva Crome, MD  cholecalciferol (VITAMIN D) 1000 units tablet Take 1,000 Units by mouth daily.   Yes Historical Provider, MD  Coenzyme Q10 (CO Q 10) 100 MG CAPS Take 100 mg by mouth 2 (two) times daily.    Yes Historical Provider, MD  CRESTOR 20 MG tablet TAKE 1 TABLET EVERY DAY Patient taking differently: TAKE 1 TABLET EVERY DAY AT BEDTIME 04/24/14  Yes Belva Crome, MD  fenofibrate 160 MG tablet Take 160 mg by mouth at bedtime.    Yes Historical Provider, MD  furosemide (LASIX) 20 MG tablet Take 1 tablet (20 mg total) by mouth every morning. Patient taking differently: Take 20 mg by mouth daily.  11/13/14  Yes Belva Crome, MD  gabapentin (NEURONTIN) 300 MG capsule Take 300 mg by mouth 3 (three) times daily.   Yes Historical Provider, MD  HYDROcodone-acetaminophen (NORCO) 10-325 MG tablet Take 1 tablet by mouth 3 (three) times daily as needed (pain).  06/08/15  Yes Historical Provider, MD  insulin glargine (LANTUS) 100 UNIT/ML  injection Inject into the skin 40 units in the morning and 40 units at bedtime. 10/08/15  Yes Philemon Kingdom, MD  insulin regular (HUMULIN R) 100 units/mL injection Inject into the skin 40 units with a small meal and 44 units with a larger meal; 3 times daily. Plus sliding scale. Patient taking differently: Inject 45-50 Units into the skin 3 (three) times daily before meals.  08/29/15  Yes Philemon Kingdom, MD  levothyroxine (SYNTHROID, LEVOTHROID) 75 MCG tablet Take 75 mcg by mouth  daily.    Yes Historical Provider, MD  losartan (COZAAR) 50 MG tablet Take 50 mg by mouth 2 (two) times daily.  01/29/15  Yes Historical Provider, MD  metFORMIN (GLUCOPHAGE) 1000 MG tablet Take 1,000 mg by mouth 2 (two) times daily with a meal.   Yes Historical Provider, MD  metoCLOPramide (REGLAN) 10 MG tablet Take 10 mg by mouth 4 (four) times daily.    Yes Historical Provider, MD  mometasone (NASONEX) 50 MCG/ACT nasal spray Place 2 sprays into the nose daily. Patient taking differently: Place 2 sprays into the nose daily as needed (allergies).  08/30/15  Yes Carmen Dohmeier, MD  montelukast (SINGULAIR) 10 MG tablet Take 10 mg by mouth at bedtime.   Yes Historical Provider, MD  Multiple Vitamin (MULTIVITAMIN WITH MINERALS) TABS tablet Take 1 tablet by mouth daily. One A Day 50+   Yes Historical Provider, MD  nitroGLYCERIN (NITROSTAT) 0.4 MG SL tablet Place 1 tablet (0.4 mg total) under the tongue every 5 (five) minutes as needed for chest pain. 10/29/13  Yes Rhonda G Barrett, PA-C  omeprazole (PRILOSEC) 20 MG capsule Take 20 mg by mouth 2 (two) times daily before a meal.   Yes Historical Provider, MD  polyethylene glycol (MIRALAX / GLYCOLAX) packet Take 17 g by mouth every other day.    Yes Historical Provider, MD  topiramate (TOPAMAX) 50 MG tablet Take 50 mg by mouth 2 (two) times daily.   Yes Historical Provider, MD  Vitamin D, Ergocalciferol, (DRISDOL) 50000 UNITS CAPS Take 50,000 Units by mouth every 7 (seven) days.  Thursdays   Yes Historical Provider, MD   Physical Exam: Filed Vitals:   11/10/15 0511 11/10/15 0630 11/10/15 0700 11/10/15 0713  BP: 159/58 160/48  198/72  Pulse: 82 87  85  Temp:   98 F (36.7 C) 98.3 F (36.8 C)  TempSrc:    Oral  Resp: 17 20  20   Height:   5\' 1"  (1.549 m)   Weight:   101 kg (222 lb 10.6 oz)   SpO2: 97% 98%  99%    Wt Readings from Last 3 Encounters:  11/10/15 101 kg (222 lb 10.6 oz)  09/21/15 100.245 kg (221 lb)  08/24/15 100.154 kg (220 lb 12.8 oz)    General:  Appears calm and comfortable Eyes: PERRL, normal lids, irises & conjunctiva ENT: grossly normal hearing, lips & tongueMucous membranes of her mouth are moist and pink Neck: no LAD, masses or thyromegaly Cardiovascular: RRR, no m/r/g. No LE edema.  Respiratory: CTA bilaterally, no w/r/r. Normal respiratory effort. Abdomen: soft, ntnd, obese positive bowel sounds no guarding Skin: no rash or induration seen on limited exam Musculoskeletal: grossly normal tone BUE/BLE, joint without swelling/erythema Psychiatric: grossly normal mood and affect, speech fluent and appropriate Neurologic: grossly non-focal. Speech clear facial symmetry           Labs on Admission:  Basic Metabolic Panel:  Recent Labs Lab 11/10/15 0325  NA 140  K 4.4  CL 107  CO2 19*  GLUCOSE 205*  BUN 15  CREATININE 0.95  CALCIUM 9.3   Liver Function Tests: No results for input(s): AST, ALT, ALKPHOS, BILITOT, PROT, ALBUMIN in the last 168 hours. No results for input(s): LIPASE, AMYLASE in the last 168 hours. No results for input(s): AMMONIA in the last 168 hours. CBC:  Recent Labs Lab 11/10/15 0325  WBC 7.2  HGB 10.2*  HCT 34.1*  MCV 86.5  PLT 302   Cardiac Enzymes: No results for input(s):  CKTOTAL, CKMB, CKMBINDEX, TROPONINI in the last 168 hours.  BNP (last 3 results) No results for input(s): BNP in the last 8760 hours.  ProBNP (last 3 results) No results for input(s): PROBNP in the last 8760  hours.  CBG: No results for input(s): GLUCAP in the last 168 hours.  Radiological Exams on Admission: Dg Chest 2 View  11/10/2015  CLINICAL DATA:  Acute onset of central chest pressure. Initial encounter. EXAM: CHEST  2 VIEW COMPARISON:  Chest radiograph performed 03/21/2015 FINDINGS: The lungs are well-aerated. Mild vascular congestion is noted. Mild bibasilar atelectasis is seen. There is no evidence of pleural effusion or pneumothorax. The heart is borderline enlarged. No acute osseous abnormalities are seen. Cervical spinal fusion hardware is noted. IMPRESSION: Mild vascular congestion and borderline cardiomegaly. Mild bibasilar atelectasis seen. Electronically Signed   By: Garald Balding M.D.   On: 11/10/2015 03:44    EKG: Independently reviewed.Sinus rhythm Left anterior fascicular block RSR' in V1 or V2, right VCD or RVH Borderline prolonged QT interval No significant change since last tracing  Assessment/Plan Principal Problem:   Chest pain, rule out acute myocardial infarction Active Problems:   HTN (hypertension)   Chronic diastolic heart failure (HCC)   CAD in native artery   Morbid obesity (H[facility])   OSA on CPAP   Hypothyroidism  #1. Chest pain at rest. Heart score 5. History of obesity, diabetes, hypothyroidism CAD MI cardiac cath 2 years ago. Initial troponin negative. EKG without acute changes. Chest x-ray mild vascular congestion with borderline cardiomegaly. Pain-free on admission -Admit to telemetry -Cycle troponins -Serial EKG -Lipid panel hemoglobin A1c -Nitroglycerin as needed -Reports aspirin allergy -Continue Crestor and fenofibrate -GI cocktail once and then when necessary -Cardiology consult  #2. Hypertension. Blood pressure high-end of normal on admission. Home medications include Coreg, Lasix, losartan. -We will continue these -Monitor closely  #3. Diabetes. Serum glucose 205 on admission. -Continue home Lantus -Will use sliding scale insulin for optimal  control -Will likely need meal coverage as well -Obtain a hemoglobin A1c  #4. CAD. See #1. History of cardiac cath revealing multiple irregularities in the EF of 75%. As post PCI no stents -See #1 -Continue home meds  5. Morbid obesity. BMI 42.2 -Nutritional consult  #6. Chronic diastolic heart failure. Appears compensated. EF 75% per cath 2010 -Continue Lasix -Continue beta blocker -Daily weights -Monitor intake and output  7. Hypothyroidism. -TSH -Continue home Synthroid  8. Sleep apnea. -CPAP   cardiology Code Status: full DVT Prophylaxis: Family Communication: none present Disposition Plan: home  Time spent: 48 minutes  [facility]    I have examined the patient, reviewed the chart and modified the above note which I agree with. Chest pain with is atypical but has a h/o CAD with an MI in 2015 with PCI to distal 1st OM- there was a suspicion of dissection at that time. No stenting was done. Will consult cardiology.  She takes a large amount of insulin for her DM. Will decrease insulin doses while she in the hospital to prevent hypoglycemia.   Danielle Fazzino,MD Pager # [phone] [email or web address] 11/10/2015, 9:14 AM

## 2015-11-10 NOTE — ED Notes (Signed)
RN attempted to call report to floor; RN to call back  

## 2015-11-10 NOTE — Progress Notes (Signed)
[facility] received signout from Dr. Christy Gentles at 6:30am  Danielle Vargas is a 65 year old female with past medical history significant for HTN, CAD, hypothyroidism, obesity, hyperlipidemia; who presented with acute symptoms of chest pain relieved with nitroglycerin. Initial EKG and troponins negative. Admitted to a telemetry floor. Patient currently free of any chest pain symptoms.

## 2015-11-10 NOTE — Consult Note (Signed)
Reason for Consult: Chest pain  Requesting Physician: Danielle Vargas  Cardiologist: Danielle Vargas  HPI: This is a 65 y.o. female with a past medical history significant for coronary artery disease with myocardial infarction in 2015 (distal oblique marginal one spontaneous dissection treated with angioplasty only), as well as morbid obesity, DM, hyperlipidemia, hypertension, treated hypothyroidism, obstructive sleep apnea. She presents with sharp stabbing chest pain in her left anterior chest that began while at rest, while watching television, but was extremely severe and lasted several minutes. It was followed by some chest tightness which persisted intermittently for several hours.  The current symptoms are quite different from the angina that she experienced 2 years ago, described as severe retrosternal pressure and left jaw pain.  Initial point of care troponin was undetectable. ECG is low risk with pre-existing incomplete left bundle branch block, more prominent left axis deviation then in the past consistent with new left anterior fascicular block.  Echo 01/23/14 Mod LVH, EF 55%, small effusion  LHC 10/26/13 ANGIOGRAPHIC DATA:  LM: short but widely patent . LAD: prox 30-40%  LCx: The first obtuse marginal is totally occluded in its distal half. The remainder of the circumflex is widely patent . RCA: without any significant obstruction. PCI RESULTS: Angioplasty of the distal OM 1 segment reduced C1 100% stenosis to 0% with TIMI grade 3 flow. A small branch of this artery remained occluded and was seen to fill by contrast staining but did not have good antegrade flow to  LEFT VENTRICULOGRAM: Left ventricular angiogram was done in the 30 RAO projection and revealed apical severe hypokinesis/akinesis with an estimated ejection fraction of 45-50%  IMPRESSIONS: 1. Acute lateral wall ST elevation myocardial infarction do to acute occlusion of the distal half of the first obtuse  marginal. This could have been related to spontaneous coronary artery dissection.  2. Successful angioplasty of the first obtuse marginal with reestablishment of flow to the great majority of the distal bed in question. 3. Widely patent circumflex, RCA, and LAD. The LAD contains proximal calcification and less than 50% stenosis. 4. Left ventricular dysfunction with severe inferoapical hypokinesis  PMHx:  Past Medical History  Diagnosis Date  . DM (diabetes mellitus) ([place])   . Obesity   . Dyslipidemia   . HTN (hypertension)   . Hypothyroidism   . Gastroparesis   . Migraine   . Osteoporosis   . Colon polyps 7/07    hyperplastic and adenomatous  . Nephrolithiasis   . Diverticulosis   . External hemorrhoid   . Fatty liver   . Asthma   . CAD (coronary artery disease)     a. 12/2008 Cath: mild irregs throughout, EF 75%.  . 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 Allergy   . Cataract     "just beginning"  . Myocardial infarction ([place])     FEb 2015  . Depression     pt unsure, psychologist said no  . Sleep apnea     wears CPAP  . Hyperlipidemia    Past Surgical History  Procedure Laterality Date  . Tubal ligation  '85  . Breast reduction surgery  '88  . Cervical disc surgery  '98    fusion  . Carpal tunnel release  2000    right wrist  . Rotator cuff repair  '09    left shoulder  . Ulnar nerve repair  '02, '08    left done, then right  . Cesarean section  '79  '84  .  Left heart catheterization with coronary angiogram N/A 10/26/2013    Procedure: LEFT HEART CATHETERIZATION WITH CORONARY ANGIOGRAM;  Surgeon: Danielle Grooms, MD;  Location: [facility] CATH LAB;  Service: Cardiovascular;  Laterality: N/A;  . Colonoscopy      FAMHx: Family History  Problem Relation Age of Onset  . COPD Father   . Stroke Father   . Emphysema Father   . Heart failure Brother   . Heart failure Mother   . Heart attack Mother     several  . Diabetes Mother   . Kidney disease Mother   .  Hypertension Brother   . Colon cancer  45    mat. 1st cousin  . Other      celiac sprue, 1/2 brother  . Diabetes Brother     x 3  . Lung cancer Brother   . Cirrhosis Sister     liver transplant  . Liver disease Sister     transplant  . Heart attack Paternal Grandfather   . Heart attack Brother   . Esophageal cancer Neg Hx   . Stomach cancer Neg Hx   . Rectal cancer Neg Hx   . Colon cancer Paternal Aunt 41    SOCHx:  reports that she has never smoked. She has never used smokeless tobacco. She reports that she does not drink alcohol or use illicit drugs.  ALLERGIES: Allergies  Allergen Reactions  . Invokana [Canagliflozin] Diarrhea  . Nsaids Other (See Comments)    GI problems/ stomach pain  . Penicillins Rash    Has patient had a PCN reaction causing immediate rash, facial/tongue/throat swelling, SOB or lightheadedness with hypotension: No (rash on legs after 3 days [place] -June 2016) Has patient had a PCN reaction causing severe rash involving mucus membranes or skin necrosis: No Has patient had a PCN reaction that required hospitalization No Has patient had a PCN reaction occurring within the last 10 years: Yes If all of the above answers are "NO", then may proceed with Cephalosporin use.    ROS: Pertinent items noted in HPI and remainder of comprehensive ROS otherwise negative.  HOME MEDICATIONS: Prescriptions prior to admission  Medication Sig Dispense Refill Last Dose  . albuterol (PROVENTIL HFA;VENTOLIN HFA) 108 (90 BASE) MCG/ACT inhaler Inhale 2 puffs into the lungs every 6 (six) hours as needed for wheezing or shortness of breath.    Past Week at Unknown time  . aspirin EC 81 MG tablet Take 81 mg by mouth daily.    11/09/2015 at Unknown time  . budesonide-formoterol (SYMBICORT) 160-4.5 MCG/ACT inhaler Inhale 2 puffs into the lungs 2 (two) times daily as needed (shortness of breath).   11/10/2015 at Unknown time  . carisoprodol (SOMA) 350 MG tablet Take 350 mg by mouth 4  (four) times daily.    11/09/2015 at Unknown time  . carvedilol (COREG) 12.5 MG tablet Take 1 tablet (12.5 mg total) by mouth 2 (two) times daily with a meal. 180 tablet 3 11/09/2015 at 2300  . cholecalciferol (VITAMIN D) 1000 units tablet Take 1,000 Units by mouth daily.   11/09/2015 at Unknown time  . Coenzyme Q10 (CO Q 10) 100 MG CAPS Take 100 mg by mouth 2 (two) times daily.    11/09/2015 at Unknown time  . CRESTOR 20 MG tablet TAKE 1 TABLET EVERY DAY (Patient taking differently: TAKE 1 TABLET EVERY DAY AT BEDTIME) 90 tablet 1 11/09/2015 at Unknown time  . fenofibrate 160 MG tablet Take 160 mg by mouth at  bedtime.    11/09/2015 at Unknown time  . furosemide (LASIX) 20 MG tablet Take 1 tablet (20 mg total) by mouth every morning. (Patient taking differently: Take 20 mg by mouth daily. ) 90 tablet 3 11/09/2015 at Unknown time  . gabapentin (NEURONTIN) 300 MG capsule Take 300 mg by mouth 3 (three) times daily.   11/09/2015 at Unknown time  . HYDROcodone-acetaminophen (NORCO) 10-325 MG tablet Take 1 tablet by mouth 3 (three) times daily as needed (pain).   0 11/09/2015 at Unknown time  . insulin glargine (LANTUS) 100 UNIT/ML injection Inject into the skin 40 units in the morning and 40 units at bedtime. 90 mL 1 11/10/2015 at Unknown time  . insulin regular (HUMULIN R) 100 units/mL injection Inject into the skin 40 units with a small meal and 44 units with a larger meal; 3 times daily. Plus sliding scale. (Patient taking differently: Inject 45-50 Units into the skin 3 (three) times daily before meals. ) 120 mL 1 11/09/2015 at Unknown time  . levothyroxine (SYNTHROID, LEVOTHROID) 75 MCG tablet Take 75 mcg by mouth daily.    11/09/2015 at Unknown time  . losartan (COZAAR) 50 MG tablet Take 50 mg by mouth 2 (two) times daily.    11/09/2015 at Unknown time  . metFORMIN (GLUCOPHAGE) 1000 MG tablet Take 1,000 mg by mouth 2 (two) times daily with a meal.   11/09/2015 at Unknown time  . metoCLOPramide (REGLAN) 10 MG tablet Take 10 mg by  mouth 4 (four) times daily.    11/09/2015 at Unknown time  . mometasone (NASONEX) 50 MCG/ACT nasal spray Place 2 sprays into the nose daily. (Patient taking differently: Place 2 sprays into the nose daily as needed (allergies). ) 17 g 12 Past Month at Unknown time  . montelukast (SINGULAIR) 10 MG tablet Take 10 mg by mouth at bedtime.   11/09/2015 at Unknown time  . Multiple Vitamin (MULTIVITAMIN WITH MINERALS) TABS tablet Take 1 tablet by mouth daily. One A Day 50+   11/09/2015 at Unknown time  . nitroGLYCERIN (NITROSTAT) 0.4 MG SL tablet Place 1 tablet (0.4 mg total) under the tongue every 5 (five) minutes as needed for chest pain. 25 tablet 3 11/10/2015 at Unknown time  . omeprazole (PRILOSEC) 20 MG capsule Take 20 mg by mouth 2 (two) times daily before a meal.   11/09/2015 at Unknown time  . polyethylene glycol (MIRALAX / GLYCOLAX) packet Take 17 g by mouth every other day.    Past Week at Unknown time  . topiramate (TOPAMAX) 50 MG tablet Take 50 mg by mouth 2 (two) times daily.   11/09/2015 at Unknown time  . Vitamin D, Ergocalciferol, (DRISDOL) 50000 UNITS CAPS Take 50,000 Units by mouth every 7 (seven) days. Thursdays   11/08/2015    HOSPITAL MEDICATIONS: Scheduled: . carvedilol  12.5 mg Oral BID WC  . enoxaparin (LOVENOX) injection  40 mg Subcutaneous Q24H  . fenofibrate  160 mg Oral QHS  . fluticasone  1 spray Each Nare Daily  . furosemide  20 mg Oral Daily  . gabapentin  300 mg Oral TID  . insulin aspart  0-20 Units Subcutaneous TID WC  . insulin aspart  0-5 Units Subcutaneous QHS  . insulin glargine  30 Units Subcutaneous BID  . insulin glargine  40 Units Subcutaneous QHS  . levothyroxine  75 mcg Oral QAC breakfast  . losartan  50 mg Oral BID  . metoCLOPramide  10 mg Oral TID WC & HS  . mometasone-formoterol  2 puff Inhalation BID  . montelukast  10 mg Oral QHS  . multivitamin with minerals  1 tablet Oral Daily  . polyethylene glycol  17 g Oral QODAY  . rosuvastatin  20 mg Oral Daily  .  topiramate  50 mg Oral BID    VITALS: Blood pressure 198/72, pulse 85, temperature 98.3 F (36.8 C), temperature source Oral, resp. rate 20, height 5\' 1"  (1.549 m), weight 101 kg (222 lb 10.6 oz), SpO2 99 %.  PHYSICAL EXAM:  General: Alert, oriented x3, no distress Head: no evidence of trauma, PERRL, EOMI, no exophtalmos or lid lag, no myxedema, no xanthelasma; normal ears, nose and oropharynx Necknormal jugular venous pulsations and no hepatojugular reflux; brisk carotid pulses without delay and no carotid bruits Chest: clear to auscultation, no signs of consolidation by percussion or palpation, normal fremitus, symmetrical and full respiratory excursions Cardiovascular: normal position and quality of the apical impulse, regular rhythm, normal first heart sounNormal  second heart sound, no rubs or gallops, no  murmur Abdomen: no tenderness or distention, no masses by palpation, no abnormal pulsatility or arterial bruits, normal bowel sounds, no hepatosplenomegaly Extremities: no clubbing, cyanosis;  no ema; 2+ radial, ulnar and brachial pulses bilaterally; 2+ right femoral, posterior tibial and dorsalis pedis pulses; 2+ left femoral, posterior tibial and dorsalis pedis pulses; no subclavian or femoral bruits Neurological: grossly nonfocal   LABS  CBC  Recent Labs  11/10/15 0325  WBC 7.2  HGB 10.2*  HCT 34.1*  MCV 86.5  PLT 99991111   Basic Metabolic Panel  Recent Labs  11/10/15 0325  NA 140  K 4.4  CL 107  CO2 19*  GLUCOSE 205*  BUN 15  CREATININE 0.95  CALCIUM 9.3    IMAGING: Dg Chest 2 View  11/10/2015  CLINICAL DATA:  Acute onset of central chest pressure. Initial encounter. EXAM: CHEST  2 VIEW COMPARISON:  Chest radiograph performed 03/21/2015 FINDINGS: The lungs are well-aerated. Mild vascular congestion is noted. Mild bibasilar atelectasis is seen. There is no evidence of pleural effusion or pneumothorax. The heart is borderline enlarged. No acute osseous  abnormalities are seen. Cervical spinal fusion hardware is noted. IMPRESSION: Mild vascular congestion and borderline cardiomegaly. Mild bibasilar atelectasis seen. Electronically Signed   By: Garald Balding M.D.   On: 11/10/2015 03:44    ECG: sinus rhythm, incomplete right bundle branch block, left anterior fascicular block  TELEMETRY: sinus rhythm    IMPRESSION: 1. Atypical chest discomfort , distinct from previous angina pectoris with low risk ECG and initial cardiac enzymes, currently asymptomatic  2. Coronary artery disease with history of previous spontaneous section of the first oblique marginal artery 3.  type 2 diabetes mellitus 4. Hyperlipidemia, mixed 5. Obesity  RECOMMENDATION: 1. Repeat cardiac enzymes  2. If cardiac enzymes are normal, would perform an outpatient Lexiscan Myoview and arrange follow-up  Time Spent Directly with Patient: 41 minutes  Sanda Klein, MD, [facility] [phone] office [phone] pager   11/10/2015, 9:35 AM

## 2015-11-10 NOTE — Discharge Summary (Signed)
Physician Discharge Summary  Danielle Vargas [id number] DOB: 06-17-1951 DOA: 11/10/2015  PCP: Danielle Redwood, MD  Admit date: 11/10/2015 Discharge date: 11/10/2015  Time spent: 35 minutes  Recommendations for Outpatient Follow-up:  1. The patient was admitted this AM and has been evaluated by cardiology and due to normal 2nd Troponin and atypical nature of chest pain, Dr Sallyanne Kuster recommends discharge and outpatient stress test. The patient is in agreement with the plan.   Discharge Condition: stable     Discharge Diagnoses:  Principal Problem:   Chest pain, rule out acute myocardial infarction Active Problems:   HTN (hypertension)   Chronic diastolic heart failure (HCC)   CAD in native artery   Morbid obesity (HCC)   OSA on CPAP   Hypothyroidism   History of present illness:  Danielle Vargas is a 65 y.o. female with a past medical history that includes morbid obesity, dyslipidemia, hypertension, hypothyroidism, fatty liver, CAD with MI in 2015 status post cardiac cath, sleep apnea presents to the emergency department with chief complaint of chest pain which is sharp, stabbing, non-radiating.   Hospital Course:  Chest pain - atypical- possibly musculoskeletal as she has mild tenderness to palpation in the left sternal border at level of 4th-5th rib. She states about 20-30 min after receiving Nitro, the pain faded away and has not recurred.  - EKG unrevealing- second Troponin negative  Remaining medical problems stable. No change in medications made.    Consultations:  Cardiology   Discharge Exam: Filed Weights   11/10/15 0700  Weight: 101 kg (222 lb 10.6 oz)   Filed Vitals:   11/10/15 0713 11/10/15 1124  BP: 198/72 170/59  Pulse: 85   Temp: 98.3 F (36.8 C)   Resp: 20     General: AAO x 3, no distress Cardiovascular: RRR, no murmurs  Respiratory: clear to auscultation bilaterally GI: soft, non-tender, non-distended, bowel sound positive  Discharge  Instructions You were cared for by a hospitalist during your hospital stay. If you have any questions about your discharge medications or the care you received while you were in the hospital after you are discharged, you can call the unit and asked to speak with the hospitalist on call if the hospitalist that took care of you is not available. Once you are discharged, your primary care physician will handle any further medical issues. Please note that NO REFILLS for any discharge medications will be authorized once you are discharged, as it is imperative that you return to your primary care physician (or establish a relationship with a primary care physician if you do not have one) for your aftercare needs so that they can reassess your need for medications and monitor your lab values.      Discharge Instructions    Discharge instructions    Complete by:  As directed   Diabetic, low fat diet     Increase activity slowly    Complete by:  As directed             Medication List    TAKE these medications        albuterol 108 (90 Base) MCG/ACT inhaler  Commonly known as:  PROVENTIL HFA;VENTOLIN HFA  Inhale 2 puffs into the lungs every 6 (six) hours as needed for wheezing or shortness of breath.     aspirin EC 81 MG tablet  Take 81 mg by mouth daily.     budesonide-formoterol 160-4.5 MCG/ACT inhaler  Commonly known as:  SYMBICORT  Inhale 2 puffs into  the lungs 2 (two) times daily as needed (shortness of breath).     carisoprodol 350 MG tablet  Commonly known as:  SOMA  Take 350 mg by mouth 4 (four) times daily.     carvedilol 12.5 MG tablet  Commonly known as:  COREG  Take 1 tablet (12.5 mg total) by mouth 2 (two) times daily with a meal.     cholecalciferol 1000 units tablet  Commonly known as:  VITAMIN D  Take 1,000 Units by mouth daily.     Co Q 10 100 MG Caps  Take 100 mg by mouth 2 (two) times daily.     CRESTOR 20 MG tablet  Generic drug:  rosuvastatin  TAKE 1 TABLET  EVERY DAY     fenofibrate 160 MG tablet  Take 160 mg by mouth at bedtime.     furosemide 20 MG tablet  Commonly known as:  LASIX  Take 1 tablet (20 mg total) by mouth every morning.     gabapentin 300 MG capsule  Commonly known as:  NEURONTIN  Take 300 mg by mouth 3 (three) times daily.     HYDROcodone-acetaminophen 10-325 MG tablet  Commonly known as:  NORCO  Take 1 tablet by mouth 3 (three) times daily as needed (pain).     insulin glargine 100 UNIT/ML injection  Commonly known as:  LANTUS  Inject into the skin 40 units in the morning and 40 units at bedtime.     insulin regular 100 units/mL injection  Commonly known as:  HUMULIN R  Inject into the skin 40 units with a small meal and 44 units with a larger meal; 3 times daily. Plus sliding scale.     levothyroxine 75 MCG tablet  Commonly known as:  SYNTHROID, LEVOTHROID  Take 75 mcg by mouth daily.     losartan 50 MG tablet  Commonly known as:  COZAAR  Take 50 mg by mouth 2 (two) times daily.     metFORMIN 1000 MG tablet  Commonly known as:  GLUCOPHAGE  Take 1,000 mg by mouth 2 (two) times daily with a meal.     metoCLOPramide 10 MG tablet  Commonly known as:  REGLAN  Take 10 mg by mouth 4 (four) times daily.     mometasone 50 MCG/ACT nasal spray  Commonly known as:  NASONEX  Place 2 sprays into the nose daily.     montelukast 10 MG tablet  Commonly known as:  SINGULAIR  Take 10 mg by mouth at bedtime.     multivitamin with minerals Tabs tablet  Take 1 tablet by mouth daily. One A Day 50+     nitroGLYCERIN 0.4 MG SL tablet  Commonly known as:  NITROSTAT  Place 1 tablet (0.4 mg total) under the tongue every 5 (five) minutes as needed for chest pain.     omeprazole 20 MG capsule  Commonly known as:  PRILOSEC  Take 20 mg by mouth 2 (two) times daily before a meal.     polyethylene glycol packet  Commonly known as:  MIRALAX / GLYCOLAX  Take 17 g by mouth every other day.     topiramate 50 MG tablet   Commonly known as:  TOPAMAX  Take 50 mg by mouth 2 (two) times daily.     Vitamin D (Ergocalciferol) 50000 units Caps capsule  Commonly known as:  DRISDOL  Take 50,000 Units by mouth every 7 (seven) days. Thursdays       Allergies  Allergen Reactions  .  Invokana [Canagliflozin] Diarrhea  . Nsaids Other (See Comments)    GI problems/ stomach pain  . Penicillins Rash    Has patient had a PCN reaction causing immediate rash, facial/tongue/throat swelling, SOB or lightheadedness with hypotension: No (rash on legs after 3 days [place] -June 2016) Has patient had a PCN reaction causing severe rash involving mucus membranes or skin necrosis: No Has patient had a PCN reaction that required hospitalization No Has patient had a PCN reaction occurring within the last 10 years: Yes If all of the above answers are "NO", then may proceed with Cephalosporin use.      The results of significant diagnostics from this hospitalization (including imaging, microbiology, ancillary and laboratory) are listed below for reference.    Significant Diagnostic Studies: Dg Chest 2 View  11/10/2015  CLINICAL DATA:  Acute onset of central chest pressure. Initial encounter. EXAM: CHEST  2 VIEW COMPARISON:  Chest radiograph performed 03/21/2015 FINDINGS: The lungs are well-aerated. Mild vascular congestion is noted. Mild bibasilar atelectasis is seen. There is no evidence of pleural effusion or pneumothorax. The heart is borderline enlarged. No acute osseous abnormalities are seen. Cervical spinal fusion hardware is noted. IMPRESSION: Mild vascular congestion and borderline cardiomegaly. Mild bibasilar atelectasis seen. Electronically Signed   By: Garald Balding M.D.   On: 11/10/2015 03:44    Microbiology: No results found for this or any previous visit (from the past 240 hour(s)).   Labs: Basic Metabolic Panel:  Recent Labs Lab 11/10/15 0325  NA 140  K 4.4  CL 107  CO2 19*  GLUCOSE 205*  BUN 15  CREATININE  0.95  CALCIUM 9.3   Liver Function Tests: No results for input(s): AST, ALT, ALKPHOS, BILITOT, PROT, ALBUMIN in the last 168 hours. No results for input(s): LIPASE, AMYLASE in the last 168 hours. No results for input(s): AMMONIA in the last 168 hours. CBC:  Recent Labs Lab 11/10/15 0325  WBC 7.2  HGB 10.2*  HCT 34.1*  MCV 86.5  PLT 302   Cardiac Enzymes:  Recent Labs Lab 11/10/15 1004  TROPONINI <0.03   BNP: BNP (last 3 results) No results for input(s): BNP in the last 8760 hours.  ProBNP (last 3 results) No results for input(s): PROBNP in the last 8760 hours.  CBG:  Recent Labs Lab 11/10/15 0755 11/10/15 1131  GLUCAP 180* 149*       SignedDebbe Odea, MD [facility] 11/10/2015, 1:18 PM

## 2015-11-10 NOTE — ED Provider Notes (Signed)
CSN: [id number]     Arrival date & time 11/10/15  0303 History  By signing my name below, I, Arianna Nassar, attest that this documentation has been prepared under the direction and in the presence of Ripley Fraise, MD. Electronically Signed: Julien Nordmann, ED Scribe. 11/10/2015. 3:32 AM.    Chief Complaint  Patient presents with  . Chest Pain      Patient is a 65 y.o. female presenting with chest pain. The history is provided by the patient. No language interpreter was used.  Chest Pain Pain location:  L chest Pain quality: pressure   Pain radiates to:  Does not radiate Pain radiates to the back: no   Pain severity:  Moderate Onset quality:  Sudden Duration:  3 hours Timing:  Intermittent Progression:  Worsening Chronicity:  New Context: at rest and stress   Relieved by:  Aspirin Worsened by:  Nothing tried Ineffective treatments:  None tried Associated symptoms: no abdominal pain, no back pain, no cough, no nausea, no numbness, not vomiting and no weakness   Risk factors: coronary artery disease, diabetes mellitus and hypertension    HPI Comments: Danielle Vargas is a 65 y.o. female who has a hx of DM, dyslipidemia, HTN, hypothyroidism, nephrolithiasis, diverticulosis, CAD and MI was brought in by EMS presents to the Emergency Department complaining of intermittent, gradual worsening non-radiating central chest pain with associated chest tightness onset two and a half hours ago. She notes she was sitting on her couch watching television when she had sudden onset chest pain. Pt reports she has been under a lot of stress lately which she believes could be a possible cause. Per EMS, pt received 4, 81 mg of aspirin en route to the hospital with minimal relief. Pt has a hx of a MI and left heart catheterization in 2015. She denies diaphoresis, numbness, abdominal pain, back pain, nausea, vomiting, cough. Pt does not have a hx of VTE.    Past Medical History  Diagnosis Date  . DM  (diabetes mellitus) ([place])   . Obesity   . Dyslipidemia   . HTN (hypertension)   . Hypothyroidism   . Gastroparesis   . Migraine   . Osteoporosis   . Colon polyps 7/07    hyperplastic and adenomatous  . Nephrolithiasis   . Diverticulosis   . External hemorrhoid   . Fatty liver   . Asthma   . CAD (coronary artery disease)     a. 12/2008 Cath: mild irregs throughout, EF 75%.  . 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 Allergy   . Cataract     "just beginning"  . Myocardial infarction ([place])     FEb 2015  . Depression     pt unsure, psychologist said no  . Sleep apnea     wears CPAP  . Hyperlipidemia    Past Surgical History  Procedure Laterality Date  . Tubal ligation  '85  . Breast reduction surgery  '88  . Cervical disc surgery  '98    fusion  . Carpal tunnel release  2000    right wrist  . Rotator cuff repair  '09    left shoulder  . Ulnar nerve repair  '02, '08    left done, then right  . Cesarean section  '79  '84  . Left heart catheterization with coronary angiogram N/A 10/26/2013    Procedure: LEFT HEART CATHETERIZATION WITH CORONARY ANGIOGRAM;  Surgeon: Sinclair Grooms, MD;  Location: [facility] CATH LAB;  Service: Cardiovascular;  Laterality: N/A;  . Colonoscopy     Family History  Problem Relation Age of Onset  . COPD Father   . Stroke Father   . Emphysema Father   . Heart failure Brother   . Heart failure Mother   . Heart attack Mother     several  . Diabetes Mother   . Kidney disease Mother   . Hypertension Brother   . Colon cancer  45    mat. 1st cousin  . Other      celiac sprue, 1/2 brother  . Diabetes Brother     x 3  . Lung cancer Brother   . Cirrhosis Sister     liver transplant  . Liver disease Sister     transplant  . Heart attack Paternal Grandfather   . Heart attack Brother   . Esophageal cancer Neg Hx   . Stomach cancer Neg Hx   . Rectal cancer Neg Hx   . Colon cancer Paternal Aunt 80   Social History  Substance Use Topics  .  Smoking status: Never Smoker   . Smokeless tobacco: Never Used  . Alcohol Use: No   OB History    No data available     Review of Systems  Respiratory: Negative for cough.   Cardiovascular: Positive for chest pain.  Gastrointestinal: Negative for nausea, vomiting and abdominal pain.  Musculoskeletal: Negative for back pain.  Neurological: Negative for weakness and numbness.  All other systems reviewed and are negative.     Allergies  Invokana; Nsaids; and Penicillins  Home Medications   Prior to Admission medications   Medication Sig Start Date End Date Taking? Authorizing Provider  albuterol (PROVENTIL HFA;VENTOLIN HFA) 108 (90 BASE) MCG/ACT inhaler Inhale 2 puffs into the lungs every 6 (six) hours as needed for wheezing or shortness of breath.     Historical Provider, MD  aspirin EC 81 MG tablet Take 81 mg by mouth daily.     Historical Provider, MD  budesonide-formoterol (SYMBICORT) 160-4.5 MCG/ACT inhaler Inhale 2 puffs into the lungs 2 (two) times daily as needed (shortness of breath).    Historical Provider, MD  carisoprodol (SOMA) 350 MG tablet Take 350 mg by mouth 4 (four) times daily.     Historical Provider, MD  carvedilol (COREG) 12.5 MG tablet Take 1 tablet (12.5 mg total) by mouth 2 (two) times daily with a meal. 11/13/14   Belva Crome, MD  Coenzyme Q10 (CO Q 10) 100 MG CAPS Take 100 mg by mouth 2 (two) times daily.     Historical Provider, MD  CRESTOR 20 MG tablet TAKE 1 TABLET EVERY DAY Patient taking differently: TAKE 1 TABLET EVERY DAY AT BEDTIME 04/24/14   Belva Crome, MD  cyclobenzaprine (FLEXERIL) 10 MG tablet Reported on 09/21/2015 07/16/15   Historical Provider, MD  fenofibrate 160 MG tablet Take 160 mg by mouth at bedtime.     Historical Provider, MD  furosemide (LASIX) 20 MG tablet Take 1 tablet (20 mg total) by mouth every morning. Patient taking differently: Take 20 mg by mouth daily.  11/13/14   Belva Crome, MD  gabapentin (NEURONTIN) 300 MG capsule  Take 300 mg by mouth 3 (three) times daily.    Historical Provider, MD  glipiZIDE (GLUCOTROL) 10 MG tablet Take 1 tablet (10 mg total) by mouth 2 (two) times daily before a meal. Patient not taking: Reported on 09/21/2015 08/24/15   Philemon Kingdom, MD  HUMULIN R 500 UNIT/ML injection Reported on  09/21/2015 06/27/15   Historical Provider, MD  HYDROcodone-acetaminophen (NORCO) 10-325 MG tablet Take 1 tablet by mouth 3 (three) times daily as needed (pain).  06/08/15   Historical Provider, MD  insulin glargine (LANTUS) 100 UNIT/ML injection Inject into the skin 40 units in the morning and 40 units at bedtime. 10/08/15   Philemon Kingdom, MD  insulin regular (HUMULIN R) 100 units/mL injection Inject into the skin 40 units with a small meal and 44 units with a larger meal; 3 times daily. Plus sliding scale. 08/29/15   Philemon Kingdom, MD  levothyroxine (SYNTHROID, LEVOTHROID) 75 MCG tablet Take 75 mcg by mouth daily.     Historical Provider, MD  losartan (COZAAR) 50 MG tablet Take 50 mg by mouth 2 (two) times daily.  01/29/15   Historical Provider, MD  metFORMIN (GLUCOPHAGE) 1000 MG tablet Take 1,000 mg by mouth 2 (two) times daily with a meal.    Historical Provider, MD  metoCLOPramide (REGLAN) 10 MG tablet Take 10 mg by mouth 4 (four) times daily.     Historical Provider, MD  mometasone (NASONEX) 50 MCG/ACT nasal spray Place 2 sprays into the nose daily. 08/30/15   Asencion Partridge Dohmeier, MD  montelukast (SINGULAIR) 10 MG tablet Take 10 mg by mouth at bedtime.    Historical Provider, MD  Multiple Vitamin (MULTIVITAMIN WITH MINERALS) TABS tablet Take 1 tablet by mouth daily. One A Day 50+    Historical Provider, MD  nitroGLYCERIN (NITROSTAT) 0.4 MG SL tablet Place 1 tablet (0.4 mg total) under the tongue every 5 (five) minutes as needed for chest pain. Patient not taking: Reported on 09/21/2015 10/29/13   Evelene Croon Barrett, PA-C  omeprazole (PRILOSEC) 20 MG capsule Take 20 mg by mouth 2 (two) times daily before a  meal.    Historical Provider, MD  polyethylene glycol (MIRALAX / GLYCOLAX) packet Take 17 g by mouth every other day.     Historical Provider, MD  topiramate (TOPAMAX) 50 MG tablet Take 50 mg by mouth 2 (two) times daily.    Historical Provider, MD  Vitamin D, Ergocalciferol, (DRISDOL) 50000 UNITS CAPS Take 50,000 Units by mouth every 7 (seven) days. Thursdays    Historical Provider, MD   Triage vitals: BP 159/62 mmHg  Pulse 87  Temp(Src) 98.6 F (37 C) (Oral)  Resp 26  SpO2 97% Physical Exam CONSTITUTIONAL: Well developed/well nourished HEAD: Normocephalic/atraumatic ENMT: Mucous membranes moist NECK: supple no meningeal signs SPINE/BACK:entire spine nontender CV: S1/S2 noted, no murmurs/rubs/gallops noted LUNGS: Lungs are clear to auscultation bilaterally, no apparent distress ABDOMEN: soft, nontender, no rebound or guarding, bowel sounds noted throughout abdomen GU:no cva tenderness NEURO: Pt is awake/alert/appropriate, moves all extremitiesx4.  No facial droop.   EXTREMITIES: pulses normal/equal, full ROM, no calf tenderness or edema SKIN: warm, color normal PSYCH: no abnormalities of mood noted, alert and oriented to situation  ED Course  Procedures  DIAGNOSTIC STUDIES: Oxygen Saturation is 97% on RA, normal by my interpretation.  COORDINATION OF CARE:  3:27 AM Discussed treatment plan which includes nitroglycerin and lab work with pt at bedside and pt agreed to plan. Pt with h/o CAD and also spotaneous coronary artery dissection Will treat with NTg Pt has already taken ASA today 6:34 AM Pt stable in the ED She did have another brief episode of CP that resolved Will admit for further evaluation No pleuritic CP reported to suggest PE D/w dr Fuller Plan with [facility]ad will admit  [place] - Abnormal;  Notable for the following:    CO2 19 (*)    Glucose, Bld 205 (*)    All other components within normal limits  CBC - Abnormal;  Notable for the following:    Hemoglobin 10.2 (*)    HCT 34.1 (*)    MCH 25.9 (*)    MCHC 29.9 (*)    All other components within normal limits  I-STAT TROPOININ, ED    Imaging Review Dg Chest 2 View  11/10/2015  CLINICAL DATA:  Acute onset of central chest pressure. Initial encounter. EXAM: CHEST  2 VIEW COMPARISON:  Chest radiograph performed 03/21/2015 FINDINGS: The lungs are well-aerated. Mild vascular congestion is noted. Mild bibasilar atelectasis is seen. There is no evidence of pleural effusion or pneumothorax. The heart is borderline enlarged. No acute osseous abnormalities are seen. Cervical spinal fusion hardware is noted. IMPRESSION: Mild vascular congestion and borderline cardiomegaly. Mild bibasilar atelectasis seen. Electronically Signed   By: Garald Balding M.D.   On: 11/10/2015 03:44   I have personally reviewed and evaluated these images and lab results as part of my medical decision-making.   EKG Interpretation   Date/Time:  Saturday November 10 2015 03:12:24 EST Ventricular Rate:  85 PR Interval:  150 QRS Duration: 98 QT Interval:  411 QTC Calculation: 489 R Axis:   -46 Text Interpretation:  Sinus rhythm Left anterior fascicular block RSR' in  V1 or V2, right VCD or RVH Borderline prolonged QT interval No significant  change since last tracing Confirmed by Christy Gentles  MD, Shedrick Sarli ([id number]) on  11/10/2015 3:21:25 AM      MDM   Final diagnoses:  Chest pain, rule out acute myocardial infarction    Nursing notes including past medical history and social history reviewed and considered in documentation xrays/imaging reviewed by myself and considered during evaluation Labs/vital reviewed myself and considered during evaluation Previous records reviewed and considered  I personally performed the services described in this documentation, which was scribed in my presence. The recorded information has been reviewed and is accurate.       Ripley Fraise, MD 11/10/15  [phone]

## 2015-11-10 NOTE — ED Notes (Signed)
MD at bedside. 

## 2015-11-12 LAB — HEMOGLOBIN A1C
Hgb A1c MFr Bld: 7.3 % — ABNORMAL HIGH (ref 4.8–5.6)
Mean Plasma Glucose: 163 mg/dL

## 2015-11-13 ENCOUNTER — Ambulatory Visit (INDEPENDENT_AMBULATORY_CARE_PROVIDER_SITE_OTHER): Payer: Medicare Other | Admitting: Adult Health

## 2015-11-13 ENCOUNTER — Encounter: Payer: Self-pay | Admitting: Adult Health

## 2015-11-13 VITALS — BP 137/64 | HR 85 | Ht 61.0 in | Wt 224.5 lb

## 2015-11-13 DIAGNOSIS — G4733 Obstructive sleep apnea (adult) (pediatric): Secondary | ICD-10-CM | POA: Diagnosis not present

## 2015-11-13 DIAGNOSIS — Z9989 Dependence on other enabling machines and devices: Principal | ICD-10-CM

## 2015-11-13 NOTE — Progress Notes (Signed)
PATIENT: Danielle Vargas DOB: Dec 13, 1950  REASON FOR VISIT: follow up- OSA on CPAP HISTORY FROM: patient  HISTORY OF PRESENT ILLNESS: UPDATE 11/13/14 (MM): Danielle Vargas is a 65 year old female with a history of obstructive sleep apnea on CPAP. She returns today for a compliance download. Her download indicates that she uses her machine 30 out of 30 days for compliance of 100%. She uses her machine greater than 4 hours 22 out of 30 days for compliance of 73%. On average she uses her machine for  4 hours and 49 minutes. Her residual AHI is 0.3 on a minimum pressure of 10 cm of H20  and maximum pressure of 20 cm of water with EPR of 3. She does not have a leak. Overall the patient feels that she is doing better with using the machine for longer periods of time. Her Epworth sleepiness score is 11 and fatigue severity score is 55. She denies any new neurological symptoms. She returns today for an evaluation.  UPDATE 08/14/15 (MM):Danielle Vargas is a 65 year old female with a history of obstructive sleep apnea on CPAP. She returns today for a compliance download. She used her machine 29 out of 30 days for compliance of 97%. However she only uses her machine greater than 4 hours 13 out of 30 days for compliance of 43%. On average she uses her machine 3 hours and 31 minutes. This is slightly better than her previous download. Her residual AHI 0.5. The patient does not have a significant leak. She is on AutoSet with a minimum pressure of 10 cm of water and maximum pressure of 20 cm water with EPR of 3. The patient states that she puts the mask on as soon as she goes to bed. However she will wake up in the morning and it will be sitting on her nightstand. She does not recall taking it off during the night. She states that she is wondering if she becomes claustrophobic and removes the mask without knowing during the night. She is curious of a new mask would be beneficial. Her Epworth sleepiness score is 19 she reports  that she did not try modafinil she did not want to add on a new medication. She really feels that her sleepiness is due to the medication she is already on. She reports that she is on the muscle relaxer Soma but this will not be covered under her insurance next year. She states that her primary care provider tried her on a different muscle relaxer although she cannot recall the name she states that she was unable to tolerate this as it caused extreme drowsiness. She has restarted the [facility] for now but we'll have to try different medication once the prescription expires. She denies any new neurological symptoms. She returns today for an evaluation.  HISTORY (DOHMEIER):  07-17-15, In the interval. Since our last visit Danielle Vargas presented to the hospital for recurrent nephrolithiasis, also she had nausea, vomiting and gastroparesis. Today she presents with an Epworth sleepiness score of 11 points and a fatigue severity score of 55 points, the geriatric depression score was endorsed at 4 points. He reports that she can fall asleep very easily whenever not physically active or not stimulated. She acknowledges that some of her medications are sleepy making but still concerns her. I was also able to review her CPAP download she has a 90% compliance for days of use but only 6 days was she able to use the machine 4 hours or longer. The average  user time was only 2 hours and 55 minutes. The machine is an auto between 10 and 20 cm water, her residual AHI is 0.5 the pressures are 12.7 at the 95th percentile and she does not have major air leaks. She reports that she often took the mask off in the middle of the night- being unaware that she did. The machine clearly helps to reduce the apnea so we have to discuss how to help her not losing the interface at night. I like for the patient to adjust her bedtime from 1 or 2 AM slowly towards pre-midnight. I would also like her to use the machine in daytime if she takes a  nap. My goal is to expand the use of CPAP 2 over 4 hours nightly. The interface is currently comfortable enough that does need to be changed . She uses a nasal mask. Patient sleeps on her side, she sleeps on one pillow only. I will recommend a horseshoe pillow this could be one of the travel pillows or bean bag pillows. It will help to not dislodge the mask at night.  REVIEW OF SYSTEMS: Out of a complete 14 system review of symptoms, the patient complains only of the following symptoms, and all other reviewed systems are negative.  See history of present illness  ALLERGIES: Allergies  Allergen Reactions  . Invokana [Canagliflozin] Diarrhea  . Nsaids Other (See Comments)    GI problems/ stomach pain  . Penicillins Rash    Has patient had a PCN reaction causing immediate rash, facial/tongue/throat swelling, SOB or lightheadedness with hypotension: No (rash on legs after 3 days [place] -June 2016) Has patient had a PCN reaction causing severe rash involving mucus membranes or skin necrosis: No Has patient had a PCN reaction that required hospitalization No Has patient had a PCN reaction occurring within the last 10 years: Yes If all of the above answers are "NO", then may proceed with Cephalosporin use.    HOME MEDICATIONS: Outpatient Prescriptions Prior to Visit  Medication Sig Dispense Refill  . albuterol (PROVENTIL HFA;VENTOLIN HFA) 108 (90 BASE) MCG/ACT inhaler Inhale 2 puffs into the lungs every 6 (six) hours as needed for wheezing or shortness of breath.     Marland Kitchen aspirin EC 81 MG tablet Take 81 mg by mouth daily.     . budesonide-formoterol (SYMBICORT) 160-4.5 MCG/ACT inhaler Inhale 2 puffs into the lungs 2 (two) times daily as needed (shortness of breath).    . carisoprodol (SOMA) 350 MG tablet Take 350 mg by mouth 4 (four) times daily.     . carvedilol (COREG) 12.5 MG tablet Take 1 tablet (12.5 mg total) by mouth 2 (two) times daily with a meal. 180 tablet 3  . cholecalciferol (VITAMIN D)  1000 units tablet Take 1,000 Units by mouth daily.    . Coenzyme Q10 (CO Q 10) 100 MG CAPS Take 100 mg by mouth 2 (two) times daily.     . CRESTOR 20 MG tablet TAKE 1 TABLET EVERY DAY (Patient taking differently: TAKE 1 TABLET EVERY DAY AT BEDTIME) 90 tablet 1  . fenofibrate 160 MG tablet Take 160 mg by mouth at bedtime.     . furosemide (LASIX) 20 MG tablet Take 1 tablet (20 mg total) by mouth every morning. (Patient taking differently: Take 20 mg by mouth daily. ) 90 tablet 3  . gabapentin (NEURONTIN) 300 MG capsule Take 300 mg by mouth 3 (three) times daily.    Marland Kitchen HYDROcodone-acetaminophen (NORCO) 10-325 MG tablet Take 1  tablet by mouth 3 (three) times daily as needed (pain).   0  . insulin glargine (LANTUS) 100 UNIT/ML injection Inject into the skin 40 units in the morning and 40 units at bedtime. 90 mL 1  . insulin regular (HUMULIN R) 100 units/mL injection Inject into the skin 40 units with a small meal and 44 units with a larger meal; 3 times daily. Plus sliding scale. (Patient taking differently: Inject 45-50 Units into the skin 3 (three) times daily before meals. ) 120 mL 1  . levothyroxine (SYNTHROID, LEVOTHROID) 75 MCG tablet Take 75 mcg by mouth daily.     Marland Kitchen losartan (COZAAR) 50 MG tablet Take 50 mg by mouth 2 (two) times daily.     . metFORMIN (GLUCOPHAGE) 1000 MG tablet Take 1,000 mg by mouth 2 (two) times daily with a meal.    . metoCLOPramide (REGLAN) 10 MG tablet Take 10 mg by mouth 4 (four) times daily.     . mometasone (NASONEX) 50 MCG/ACT nasal spray Place 2 sprays into the nose daily. (Patient taking differently: Place 2 sprays into the nose daily as needed (allergies). ) 17 g 12  . montelukast (SINGULAIR) 10 MG tablet Take 10 mg by mouth at bedtime.    . Multiple Vitamin (MULTIVITAMIN WITH MINERALS) TABS tablet Take 1 tablet by mouth daily. One A Day 50+    . nitroGLYCERIN (NITROSTAT) 0.4 MG SL tablet Place 1 tablet (0.4 mg total) under the tongue every 5 (five) minutes as needed  for chest pain. 25 tablet 3  . omeprazole (PRILOSEC) 20 MG capsule Take 20 mg by mouth 2 (two) times daily before a meal.    . polyethylene glycol (MIRALAX / GLYCOLAX) packet Take 17 g by mouth every other day.     . topiramate (TOPAMAX) 50 MG tablet Take 50 mg by mouth 2 (two) times daily.    . Vitamin D, Ergocalciferol, (DRISDOL) 50000 UNITS CAPS Take 50,000 Units by mouth every 7 (seven) days. Thursdays     No facility-administered medications prior to visit.    PAST MEDICAL HISTORY: Past Medical History  Diagnosis Date  . DM (diabetes mellitus) ([place])   . Obesity   . Dyslipidemia   . HTN (hypertension)   . Hypothyroidism   . Gastroparesis   . Migraine   . Osteoporosis   . Colon polyps 7/07    hyperplastic and adenomatous  . Nephrolithiasis   . Diverticulosis   . External hemorrhoid   . Fatty liver   . Asthma   . CAD (coronary artery disease)     a. 12/2008 Cath: mild irregs throughout, EF 75%.  . 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 Allergy   . Cataract     "just beginning"  . Myocardial infarction ([place])     FEb 2015  . Depression     pt unsure, psychologist said no  . Sleep apnea     wears CPAP  . Hyperlipidemia     PAST SURGICAL HISTORY: Past Surgical History  Procedure Laterality Date  . Tubal ligation  '85  . Breast reduction surgery  '88  . Cervical disc surgery  '98    fusion  . Carpal tunnel release  2000    right wrist  . Rotator cuff repair  '09    left shoulder  . Ulnar nerve repair  '02, '08    left done, then right  . Cesarean section  '79  '84  . Left heart catheterization with coronary angiogram N/A  10/26/2013    Procedure: LEFT HEART CATHETERIZATION WITH CORONARY ANGIOGRAM;  Surgeon: Sinclair Grooms, MD;  Location: [facility] CATH LAB;  Service: Cardiovascular;  Laterality: N/A;  . Colonoscopy      FAMILY HISTORY: Family History  Problem Relation Age of Onset  . COPD Father   . Stroke Father   . Emphysema Father   . Heart failure  Brother   . Heart failure Mother   . Heart attack Mother     several  . Diabetes Mother   . Kidney disease Mother   . Hypertension Brother   . Colon cancer  45    mat. 1st cousin  . Other      celiac sprue, 1/2 brother  . Diabetes Brother     x 3  . Lung cancer Brother   . Cirrhosis Sister     liver transplant  . Liver disease Sister     transplant  . Heart attack Paternal Grandfather   . Heart attack Brother   . Esophageal cancer Neg Hx   . Stomach cancer Neg Hx   . Rectal cancer Neg Hx   . Colon cancer Paternal Aunt 9    SOCIAL HISTORY: Social History   Social History  . Marital Status: Divorced    Spouse Name: N/A  . Number of Children: 2  . Years of Education: N/A   Occupational History  . disabled    Social History Main Topics  . Smoking status: Never Smoker   . Smokeless tobacco: Never Used  . Alcohol Use: No  . Drug Use: No  . Sexual Activity: Not Currently    Birth Control/ Protection: Post-menopausal   Other Topics Concern  . Not on file   Social History Narrative   Lives in [facility] alone.  Retired.  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   Regular exercise: none   Caffeine use: daily; dt coke               PHYSICAL EXAM  Filed Vitals:   11/13/15 1337  BP: 137/64  Pulse: 85  Height: 5\' 1"  (1.549 m)  Weight: 224 lb 8 oz (101.833 kg)   Body mass index is 42.44 kg/(m^2).  Generalized: Well developed, in no acute distress, Obese Neck: Circumference 16-1/2 inches, Mallampati 3+  Neurological examination  Mentation: Alert oriented to time, place, history taking. Follows all commands speech and language fluent Cranial nerve II-XII: Pupils were equal round reactive to light. Extraocular movements were full, visual field were full on confrontational test. Facial sensation and strength were normal. Uvula tongue midline. Head turning and shoulder shrug  were normal and symmetric. Motor: The motor testing reveals 5 over 5  strength of all 4 extremities. Good symmetric motor tone is noted throughout.  Sensory: Sensory testing is intact to soft touch on all 4 extremities. No evidence of extinction is noted.  Coordination: Cerebellar testing reveals good finger-nose-finger and heel-to-shin bilaterally.  Gait and station: Gait is normal.  Reflexes: Deep tendon reflexes are symmetric and normal bilaterally.   DIAGNOSTIC DATA (LABS, IMAGING, TESTING) - I reviewed patient records, labs, notes, testing and imaging myself where available.  Lab Results  Component Value Date   WBC 7.2 11/10/2015   HGB 10.2* 11/10/2015   HCT 34.1* 11/10/2015   MCV 86.5 11/10/2015   PLT 302 11/10/2015      Component Value Date/Time   NA 140 11/10/2015 0325   K 4.4 11/10/2015 0325   CL 107 11/10/2015  0325   CO2 19* 11/10/2015 0325   GLUCOSE 205* 11/10/2015 0325   BUN 15 11/10/2015 0325   CREATININE 0.95 11/10/2015 0325   CALCIUM 9.3 11/10/2015 0325   PROT 7.1 03/21/2015 1700   ALBUMIN 3.7 03/21/2015 1700   AST 29 03/21/2015 1700   ALT 21 03/21/2015 1700   ALKPHOS 47 03/21/2015 1700   BILITOT 0.6 03/21/2015 1700   GFRNONAA >60 11/10/2015 0325   GFRAA >60 11/10/2015 0325   Lab Results  Component Value Date   CHOL 131 11/10/2015   HDL 31* 11/10/2015   LDLCALC 51 11/10/2015   TRIG 244* 11/10/2015   CHOLHDL 4.2 11/10/2015   Lab Results  Component Value Date   HGBA1C 7.3* 11/10/2015    ASSESSMENT AND PLAN 65 y.o. year old female  has a past medical history of DM (diabetes mellitus) ([place]); Obesity; Dyslipidemia; HTN (hypertension); Hypothyroidism; Gastroparesis; Migraine; Osteoporosis; Colon polyps (7/07); Nephrolithiasis; Diverticulosis; External hemorrhoid; Fatty liver; Asthma; CAD (coronary artery disease); Hypersomnia with sleep apnea (09/18/2014); Allergy; Cataract; Myocardial infarction ([place]); Depression; Sleep apnea; and Hyperlipidemia. here with:  1. Obstructive sleep apnea on CPAP  The patient's CPAP download  shows improvement. She has been using her machine greater than 4 hours more consistently. Her apnea is treated. She is encouraged to continue using her CPAP nightly for greater than 4 hours. If her symptoms worsen or she develops any new symptoms she should let us know. She will follow-up in 6 months with Dr. Brett Fairy.  Ward Givens, MSN, NP-C 11/13/2015, 2:06 PM [facility] [address] [place], [place] [address] [phone]

## 2015-11-13 NOTE — Patient Instructions (Signed)
Continue using CPAP nightly If your symptoms worsen or you develop new symptoms please let us know.   

## 2015-11-23 ENCOUNTER — Ambulatory Visit (INDEPENDENT_AMBULATORY_CARE_PROVIDER_SITE_OTHER): Payer: Medicare Other | Admitting: Internal Medicine

## 2015-11-23 ENCOUNTER — Encounter: Payer: Self-pay | Admitting: Internal Medicine

## 2015-11-23 VITALS — BP 170/60 | HR 84 | Temp 98.3°F | Ht 61.0 in | Wt 224.2 lb

## 2015-11-23 DIAGNOSIS — E139 Other specified diabetes mellitus without complications: Secondary | ICD-10-CM

## 2015-11-23 NOTE — Patient Instructions (Signed)
Please continue: Lantus 40 units in am and 40 units at bedtime Humulin R U100: - 40 units (small meal)  - 44 units (larger meal)  Sliding scale Humulin insulin: - 150- 165: + 1 unit  - 166- 180: + 2 units  - 181- 195: + 3 units  - 196- 210: + 4 units  - >210: + 5 units   Please come back for a follow-up appointment in 3 months

## 2015-11-23 NOTE — Progress Notes (Signed)
Patient ID: Danielle Vargas, female   DOB: [birth year], 65 y.o.   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 Last visit 3 mo ago. She has [name].  She has been admitted for CP 11/13/2015 >> r/o for AMI. She saw Dr Sallyanne Kuster in the hospital, her cardiologist is Dr. Tamala Julian.  She recently calibrated her meter >> better sugars.  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  Previous regimen: - Metformin 1000 mg bid  - U500 0.11-0.11-0.10 units before meals >> 0.09 mL tid  She has been on Glipizide in the past, too. She tried Invokana in the past >> severe diarrhea  She is now on: Lantus 40 units in am and 40 units at bedtime Humulin R U100: - 40 units (small meal)  - 44 units (larger meal)  [Sliding scale Humulin insulin: - 150- 165: + 1 unit  - 166- 180: + 2 units  - 181- 195: + 3 units  - 196- 210: + 4 units  - >210: + 5 units]  Did not start Glipizide 5 mg 2x a day before b'fast and dinner.  Pt checks her sugars 4x a day - brings meter >> downloaded: - am: 84-209 >> 91, 138-215, 250 >> 116-228 >> 116, 149-201, 247 >> 133-233, 318 >> 154-246, 257 >> 165-210 - 2h after b'fast: 129-228 >> 136, 194-301 >> 134-246 >> 152-266 >> n/c >> 114-207 - before lunch: 74-227 >> 151-269, 288 >> 104, 109-203, 244 >> 133-226 >> 149-302 >> 119, 167-318 >> 108-256 - 2h after lunch: 165-209, 264 >> 93-223, 251 >> 188-222 >>  90-308 >> n/c >> 83, 126-199 - before dinner: 137, 142-229, 247 >> 146-239 >> 201-252 >> 71-246 >> 152-266, 315 >> 134-231, 252 - 2h after dinner: 174-267 >> 193-260, 322 >> 108-202, 266 >> 128-243 >> 184-235 >> n/c >> 111-214 - bedtime:118, 163-275 >> 111-242, 300 >> 147-223 >> 133-300 >> 191-260, 289 >> 140-216, 252 - nighttime: 161-310 >> n/c >> 75-232 >> 136-291 >> n/c few lows - lowest 71 >> 119 >> 83; she has hypoglycemia awareness at 95.  Highest sugar was 300s >> 262.  Pt's meals are: - Breakfast: 2 slices of bread + PB - Lunch: sandwich;  salad + cheese + croutons;  baked potato >> pinto beans + cornbread - Dinner: leftovers or corn bread + pinto beans + occas. Cole slaw >> salad + lite dressing + saltines Not soft drinks anymore. Snacks at night  - has mild CKD, last BUN/creatinine:  Lab Results  Component Value Date   BUN 15 11/10/2015   CREATININE 0.95 11/10/2015  She is back on Losartan. - pt has HL:  02/2015: TG 252 (PCP)  Lab Results  Component Value Date   CHOL 131 11/10/2015   HDL 31* 11/10/2015   LDLCALC 51 11/10/2015   TRIG 244* 11/10/2015   CHOLHDL 4.2 11/10/2015  She is on Fenofibrate. - last eye exam was in 08/22/2015 - Dr Zigmund Daniel. R>L DR.  - + numbness and tingling  in her feet. On neurontin.  She also has a history of HTN, HL, fatty liver, GERD, morbid obesity, hypothyroidism, migraines, chronic back pain, history of nephrolithiasis, osteoporosis, diverticulosis. She was recently dx'ed with OSA >> on CPAP.  Since had pancreatitis (03/21/2015).   I reviewed pt's medications, allergies, PMH, social hx, family hx, and changes were documented in the history of present illness. Otherwise, unchanged from my initial visit note.  ROS: Constitutional: no weight gain/loss, + fatigue, no subjective hyperthermia/hypothermia Eyes: no blurry vision, no xerophthalmia ENT: no sore throat, no nodules palpated in throat, no dysphagia/odynophagia, no  hoarseness Cardiovascular: no CP/SOB/palpitations/leg swelling Respiratory: no cough/SOB/wheezing Gastrointestinal: no N/V/D/C Musculoskeletal: no muscle/joint aches Skin: no rashes Neurological: no tremors/numbness/tingling/dizziness  PE: BP 170/60 mmHg  Pulse 84  Temp(Src) 98.3 F (36.8 C) (Oral)  Ht 5\' 1"  (1.549 m)  Wt 224 lb 3.2 oz (101.696 kg)  BMI 42.38 kg/m2  SpO2 97% Body mass index is 42.38 kg/(m^2). Wt Readings from Last 3 Encounters:  11/23/15 224 lb 3.2 oz (101.696 kg)  11/13/15 224 lb 8 oz (101.833 kg)  11/10/15 222 lb 10.6 oz (101 kg)   Constitutional: overweight, in NAD Eyes: PERRLA, EOMI, no exophthalmos ENT: moist mucous membranes, no thyromegaly, no cervical lymphadenopathy Cardiovascular: RRR, No MRG Respiratory: CTA B Gastrointestinal: abdomen soft, NT, ND, BS+ Musculoskeletal: no deformities, strength intact in all 4 Skin: moist, warm, no rashes  ASSESSMENT: 1. DM2, insulin-dependent, uncontrolled, with complications - CAD - AMI 10/26/2013 - had angioplasty, no stent placed. Had 2DEcho on 01/23/2014 EF increased (from 35) to 55%. - peripheral neuropathy, on Neurontin - Gastroparesis, on Reglan - Dr. 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  PLAN:  1. Patient with long-standing diabetes - sugars still above goal at most times of the day, but better. She is also working on her diet. Will continue current regimen per her request since she is getting better CBG measurement after calibrating her meter.  -  I suggested:  Patient Instructions  Please continue: Lantus 40 units in am and 40 units at bedtime Humulin R U100: - 40 units (small meal)  - 44 units (larger meal)  Sliding scale Humulin insulin: - 150- 165: + 1 unit  - 166- 180: + 2 units  - 181- 195: + 3 units  - 196- 210: + 4 units  - >210: + 5 units   Please come back for a follow-up appointment in 3 months  - continue checking  sugars at different times of the day and write them down - she is doing a great job with this - she needs to check sugars 4x a day as she is a complex insulin regimen  - will skip checking fructosamine today since last hbA1c was a little better - 2 weeks ago - Return to clinic in 3 mo with sugar log

## 2015-11-23 NOTE — Progress Notes (Signed)
Pre visit review using our clinic review tool, if applicable. No additional management support is needed unless otherwise documented below in the visit note. 

## 2015-11-27 NOTE — Progress Notes (Signed)
I reviewed note and agree with plan.   VIKRAM R. PENUMALLI, MD  Certified in Neurology, Neurophysiology and Neuroimaging  [facility] [address], [address] [place], [place] [address] [phone]   

## 2015-12-17 ENCOUNTER — Telehealth: Payer: Self-pay | Admitting: Interventional Cardiology

## 2015-12-17 NOTE — Telephone Encounter (Signed)
Returned pt call. Pt sts that she did not feel well yesterday and decided to ck her BP it was 101/52. Pt has had diarrhea for 2 days, and thinks she may have been dehydrated. Yesterday she held all her BP medications she did take Lasix as prescribed. Pt reports that she feels better today and is asymptomatic. She did take Lasix today but held her other morning BP meds. Pt reports that her BP this morning was 145/53 74bpm. Adv pt to resume her medications, hydrate, and to continue to monitor her BP daily over the next couple of days. Adv pt to call back if her BP is consistently low/high or if symptoms develop. Pt agreeable with plan and verbalized understanding. FYI fwd to Dr.Smith to adv and give additional recommendations if needed

## 2015-12-17 NOTE — Telephone Encounter (Signed)
New Message:   Pt blood pressure ran low all day yesterday,she did not take any blood pressure medicine yesterday. Pt says kept seeing black stars,thought she was going to pass out. This morning her BP was 145/63 and she only took Lasix so far today. Please call to advise her on what she needs to do.

## 2015-12-26 ENCOUNTER — Telehealth: Payer: Self-pay | Admitting: Internal Medicine

## 2015-12-26 MED ORDER — INSULIN REGULAR HUMAN 100 UNIT/ML IJ SOLN: 45.0000 [IU] | Freq: Three times a day (TID) | INTRAMUSCULAR | Status: DC

## 2015-12-26 MED ORDER — "INSULIN SYRINGE 31G X 5/16"" 1 ML MISC": Status: DC

## 2015-12-26 NOTE — Telephone Encounter (Signed)
Both have been sent to [facility] for pt.

## 2015-12-26 NOTE — Telephone Encounter (Signed)
Pt needs refills sent into [facility] (Fax # [phone]) Humulin Insulin 90 days supply and Needles

## 2016-01-03 ENCOUNTER — Telehealth: Payer: Self-pay | Admitting: *Deleted

## 2016-01-03 ENCOUNTER — Encounter: Payer: Self-pay | Admitting: Internal Medicine

## 2016-01-03 ENCOUNTER — Other Ambulatory Visit: Payer: Self-pay | Admitting: *Deleted

## 2016-01-03 MED ORDER — "INSULIN SYRINGE 31G X 5/16"" 1 ML MISC": Status: DC

## 2016-01-03 NOTE — Telephone Encounter (Signed)
Pt called stating that she did not get enough insulin syringes for her insulin. She needs enough with her mail order for 5 injections a day, instead of 3. Made the correction and sent a refill to [facility].

## 2016-01-15 ENCOUNTER — Encounter: Payer: Self-pay | Admitting: Internal Medicine

## 2016-01-21 ENCOUNTER — Ambulatory Visit (INDEPENDENT_AMBULATORY_CARE_PROVIDER_SITE_OTHER): Payer: Medicare Other | Admitting: Sports Medicine

## 2016-01-21 ENCOUNTER — Encounter: Payer: Self-pay | Admitting: Sports Medicine

## 2016-01-21 DIAGNOSIS — E119 Type 2 diabetes mellitus without complications: Secondary | ICD-10-CM | POA: Diagnosis not present

## 2016-01-21 DIAGNOSIS — B351 Tinea unguium: Secondary | ICD-10-CM | POA: Diagnosis not present

## 2016-01-21 DIAGNOSIS — M79676 Pain in unspecified toe(s): Secondary | ICD-10-CM

## 2016-01-21 NOTE — Progress Notes (Signed)
Patient ID: Danielle Vargas, female   DOB: October 02, 1950, 65 y.o.   MRN: [id number]   Subjective: Danielle Vargas is a 65 y.o. female patient with history of type 2 diabetes who presents to office today complaining of long, painful nails  while ambulating in shoes; unable to trim. Patient states that the glucose reading this morning was 200mg /dl. Patient denies any new changes in medication or new problems. Patient denies any new cramping, numbness, burning or tingling in the legs.  Patient Active Problem List   Diagnosis Date Noted  . Chest pain, rule out acute myocardial infarction 11/10/2015  . Chest pain 11/10/2015  . Obesity   . Hypothyroidism   . 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 UTI (urinary tract infection) 06/16/2015  . Diabetes mellitus ([place]) 06/16/2015  . RUQ abdominal pain 04/17/2015  . Hx of colonic polyps 04/17/2015  . Family hx of colon cancer 04/17/2015  . CPAP rhinitis 12/08/2014  . OSA on CPAP 12/08/2014  . 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 Retinopathy due to secondary diabetes mellitus, without macular edema, with moderate nonproliferative retinopathy ([place]) 09/18/2014  . Neuropathy due to secondary diabetes mellitus ([place]) 09/18/2014  . Diabetes mellitus due to underlying condition with other diabetic neurological complication ([place]) 99991111  . Obesity hypoventilation syndrome ([place]) 09/18/2014  . CAD in native artery 01/23/2014  . Morbid obesity ([place]) 01/23/2014  . Renal insufficiency 11/17/2013  . HTN (hypertension) 10/29/2013  . Chronic diastolic heart failure ([place]) 10/29/2013  . 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 LADA (latent autoimmune diabetes in adults), managed as type 1 ([place]) 08/08/2013   Current Outpatient Prescriptions on File Prior to Visit  Medication Sig Dispense Refill  . albuterol (PROVENTIL HFA;VENTOLIN HFA) 108 (90 BASE) MCG/ACT inhaler Inhale 2 puffs into the lungs every 6 (six) hours as needed for wheezing or shortness of breath.      Marland Kitchen aspirin EC 81 MG tablet Take 81 mg by mouth daily.     . budesonide-formoterol (SYMBICORT) 160-4.5 MCG/ACT inhaler Inhale 2 puffs into the lungs 2 (two) times daily as needed (shortness of breath).    . carisoprodol (SOMA) 350 MG tablet Take 350 mg by mouth 4 (four) times daily.     . carvedilol (COREG) 12.5 MG tablet Take 1 tablet (12.5 mg total) by mouth 2 (two) times daily with a meal. 180 tablet 3  . cholecalciferol (VITAMIN D) 1000 units tablet Take 1,000 Units by mouth daily.    . Coenzyme Q10 (CO Q 10) 100 MG CAPS Take 100 mg by mouth 2 (two) times daily.     . CRESTOR 20 MG tablet TAKE 1 TABLET EVERY DAY (Patient taking differently: TAKE 1 TABLET EVERY DAY AT BEDTIME) 90 tablet 1  . fenofibrate 160 MG tablet Take 160 mg by mouth at bedtime.     . furosemide (LASIX) 20 MG tablet Take 1 tablet (20 mg total) by mouth every morning. (Patient taking differently: Take 20 mg by mouth daily. ) 90 tablet 3  . gabapentin (NEURONTIN) 300 MG capsule Take 300 mg by mouth 3 (three) times daily.    Marland Kitchen HYDROcodone-acetaminophen (NORCO) 10-325 MG tablet Take 1 tablet by mouth 3 (three) times daily as needed (pain).   0  . insulin glargine (LANTUS) 100 UNIT/ML injection Inject into the skin 40 units in the morning and 40 units at bedtime. 90 mL 1  . insulin regular (HUMULIN R) 100 units/mL injection Inject 0.45-0.5 mLs (45-50 Units total) into the skin 3 (three) times daily before  meals. 140 mL 1  . Insulin Syringe-Needle U-100 (INSULIN SYRINGE 1CC/31GX5/16") 31G X 5/16" 1 ML MISC Use to inject insulin 5 times daily. 500 each 3  . levothyroxine (SYNTHROID, LEVOTHROID) 75 MCG tablet Take 75 mcg by mouth daily.     Marland Kitchen losartan (COZAAR) 50 MG tablet Take 50 mg by mouth 2 (two) times daily.     . metFORMIN (GLUCOPHAGE) 1000 MG tablet Take 1,000 mg by mouth 2 (two) times daily with a meal.    . metoCLOPramide (REGLAN) 10 MG tablet Take 10 mg by mouth 4 (four) times daily.     . mometasone (NASONEX) 50 MCG/ACT  nasal spray Place 2 sprays into the nose daily. (Patient taking differently: Place 2 sprays into the nose daily as needed (allergies). ) 17 g 12  . montelukast (SINGULAIR) 10 MG tablet Take 10 mg by mouth at bedtime.    . Multiple Vitamin (MULTIVITAMIN WITH MINERALS) TABS tablet Take 1 tablet by mouth daily. One A Day 50+    . nitroGLYCERIN (NITROSTAT) 0.4 MG SL tablet Place 1 tablet (0.4 mg total) under the tongue every 5 (five) minutes as needed for chest pain. 25 tablet 3  . omeprazole (PRILOSEC) 20 MG capsule Take 20 mg by mouth 2 (two) times daily before a meal.    . polyethylene glycol (MIRALAX / GLYCOLAX) packet Take 17 g by mouth every other day.     Marland Kitchen tiZANidine (ZANAFLEX) 2 MG tablet     . topiramate (TOPAMAX) 50 MG tablet Take 50 mg by mouth 2 (two) times daily.    . Vitamin D, Ergocalciferol, (DRISDOL) 50000 UNITS CAPS Take 50,000 Units by mouth every 7 (seven) days. Thursdays     No current facility-administered medications on file prior to visit.   Allergies  Allergen Reactions  . Invokana [Canagliflozin] Diarrhea  . Nsaids Other (See Comments)    GI problems/ stomach pain  . Penicillins Rash    Has patient had a PCN reaction causing immediate rash, facial/tongue/throat swelling, SOB or lightheadedness with hypotension: No (rash on legs after 3 days [place] -June 2016) Has patient had a PCN reaction causing severe rash involving mucus membranes or skin necrosis: No Has patient had a PCN reaction that required hospitalization No Has patient had a PCN reaction occurring within the last 10 years: Yes If all of the above answers are "NO", then may proceed with Cephalosporin use.    Objective: General: Patient is awake, alert, and oriented x 3 and in no acute distress.  Integument: Skin is warm, dry and supple bilateral. Nails are tender, long, thickened and  dystrophic with subungual debris, consistent with onychomycosis, 1-5 bilateral. No signs of infection. No open lesions or  preulcerative lesions present bilateral. Remaining integument unremarkable.  Vasculature:  Dorsalis Pedis pulse 2/4 bilateral. Posterior Tibial pulse  1/4 bilateral.  Capillary fill time <3 sec 1-5 bilateral. Scant hair growth to the level of the digits. Temperature gradient within normal limits. No varicosities present bilateral. No edema present bilateral.   Neurology: The patient has intact sensation measured with a 5.07/10g Semmes Weinstein Monofilament at all pedal sites bilateral. Vibratory sensation intact bilateral with tuning fork. No Babinski sign present bilateral.   Musculoskeletal: No gross pedal deformities noted bilateral. Muscular strength 5/5 in all lower extremity muscular groups bilateral without pain or limitation on range of motion. No tenderness with calf compression bilateral.  Assessment and Plan: Problem List Items Addressed This Visit    None    Visit Diagnoses  Dermatophytosis of nail    -  Primary    Pain of toe, unspecified laterality        Diabetes mellitus without complication ([place])          -Examined patient. -Discussed and educated patient on diabetic foot care, especially with  regards to the vascular, neurological and musculoskeletal systems.  -Stressed the importance of good glycemic control and the detriment of not  controlling glucose levels in relation to the foot. -Mechanically debrided all nails 1-5 bilateral using sterile nail nipper and filed with dremel without incident  -Answered all patient questions -Patient to return  in 3 months for at risk foot care -Patient advised to call the office if any problems or questions arise in the meantime.  Landis Martins, DPM

## 2016-02-11 ENCOUNTER — Other Ambulatory Visit (HOSPITAL_COMMUNITY): Payer: Self-pay | Admitting: Internal Medicine

## 2016-02-11 DIAGNOSIS — R1310 Dysphagia, unspecified: Secondary | ICD-10-CM

## 2016-02-13 ENCOUNTER — Ambulatory Visit (HOSPITAL_COMMUNITY)
Admission: RE | Admit: 2016-02-13 | Discharge: 2016-02-13 | Disposition: A | Discharge status: Home / Self Care | Payer: Medicare Other | Source: Ambulatory Visit | Attending: Internal Medicine | Admitting: Internal Medicine

## 2016-02-13 DIAGNOSIS — R1310 Dysphagia, unspecified: Secondary | ICD-10-CM | POA: Diagnosis not present

## 2016-02-20 ENCOUNTER — Ambulatory Visit (INDEPENDENT_AMBULATORY_CARE_PROVIDER_SITE_OTHER): Payer: Medicare Other | Admitting: Ophthalmology

## 2016-02-20 DIAGNOSIS — E113293 Type 2 diabetes mellitus with mild nonproliferative diabetic retinopathy without macular edema, bilateral: Secondary | ICD-10-CM

## 2016-02-20 DIAGNOSIS — E11319 Type 2 diabetes mellitus with unspecified diabetic retinopathy without macular edema: Secondary | ICD-10-CM

## 2016-02-20 DIAGNOSIS — H2513 Age-related nuclear cataract, bilateral: Secondary | ICD-10-CM | POA: Diagnosis not present

## 2016-02-20 DIAGNOSIS — H35033 Hypertensive retinopathy, bilateral: Secondary | ICD-10-CM

## 2016-02-20 DIAGNOSIS — H43813 Vitreous degeneration, bilateral: Secondary | ICD-10-CM

## 2016-02-20 DIAGNOSIS — I10 Essential (primary) hypertension: Secondary | ICD-10-CM

## 2016-02-20 LAB — HM DIABETES EYE EXAM

## 2016-02-28 ENCOUNTER — Other Ambulatory Visit: Payer: Self-pay | Admitting: *Deleted

## 2016-02-28 MED ORDER — CARVEDILOL 12.5 MG PO TABS: 12.5000 mg | ORAL_TABLET | Freq: Two times a day (BID) | ORAL | Status: DC

## 2016-02-29 ENCOUNTER — Telehealth: Payer: Self-pay | Admitting: Internal Medicine

## 2016-02-29 ENCOUNTER — Ambulatory Visit: Payer: Medicare Other | Admitting: Internal Medicine

## 2016-02-29 NOTE — Telephone Encounter (Signed)
Patient need  A refill of Insulin Syringe-Needle U-100 (INSULIN SYRINGE 1CC/31GX5/16") 31G X 5/16" 1 ML MISC, 5 inj a day 450 syringed  Send to  [facility], [place] [phone] (Phone) [phone] (Fax)

## 2016-03-04 ENCOUNTER — Encounter: Payer: Self-pay | Admitting: Internal Medicine

## 2016-03-04 ENCOUNTER — Ambulatory Visit (INDEPENDENT_AMBULATORY_CARE_PROVIDER_SITE_OTHER): Payer: Medicare Other | Admitting: Internal Medicine

## 2016-03-04 VITALS — BP 105/50 | HR 84 | Ht 61.0 in | Wt 221.0 lb

## 2016-03-04 DIAGNOSIS — E109 Type 1 diabetes mellitus without complications: Secondary | ICD-10-CM

## 2016-03-04 LAB — POCT GLYCOSYLATED HEMOGLOBIN (HGB A1C): Hemoglobin A1C: 7.1

## 2016-03-04 MED ORDER — INSULIN GLARGINE 100 UNIT/ML ~~LOC~~ SOLN: SUBCUTANEOUS | Status: DC

## 2016-03-04 MED ORDER — INSULIN REGULAR HUMAN 100 UNIT/ML IJ SOLN: 35.0000 [IU] | Freq: Three times a day (TID) | INTRAMUSCULAR | Status: DC

## 2016-03-04 NOTE — Progress Notes (Signed)
Patient ID: Danielle Vargas, female   DOB: 09/28/1950, 65 y.o.   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 Last visit 3.5 mo ago.  She has [name].  Last hemoglobin A1c was: Lab Results  Component Value Date   HGBA1C 7.3* 11/10/2015   HGBA1C 7.4* 12/25/2014   HGBA1C 7.5* 06/08/2014  04/02/2015: Fructosamine 266, calculated HbA1c 6.13% 12/25/2014: Fructosamine 286, calculated HbA1c 6.5%; measured HbA1c 7.4%  06/08/2014: Fructosamine 269, calculated HbA1c 6.2%; measured HbA1c 7.5%  She was tested for LADA and this was positive.  Previous regimen: - Metformin 1000 mg bid  - U500 0.11-0.11-0.10 units before meals >> 0.09 mL tid  She has been on Glipizide in the past, too. She tried Invokana in the past >> severe diarrhea  She is now on: Lantus 40 units in am and 40 units at bedtime Humulin R U100: - 40 units (small meal)  - 45 units (larger meal)  [Sliding scale Humulin insulin: - 150- 165: + 1 unit  - 166- 180: + 2 units  - 181- 195: + 3 units  - 196- 210: + 4 units  - >210: + 5 units]  Did not start Glipizide 5 mg 2x a day before b'fast and dinner.  Pt checks her sugars 4x a day - brings meter >> downloaded: - am: 91, 138-215, 250 >> 116-228 >> 116, 149-201, 247 >> 133-233, 318 >> 154-246, 257 >> 165-210 >> 161-240, 351 - 2h after b'fast: 129-228 >> 136, 194-301 >> 134-246 >> 152-266 >> n/c >> 114-207 >> 115-205, 239 - before lunch: 151-269, 288 >> 104, 109-203, 244 >> 133-226 >> 149-302 >> 119, 167-318 >> 108-256 >> 141, 174-206, 229, 326 - 2h after lunch: 165-209, 264 >> 93-223, 251 >> 188-222 >> 90-308 >> n/c >> 83, 126-199 >> 57x1, 99-247 - before dinner: 137, 142-229, 247 >> 146-239 >> 201-252 >> 71-246 >> 152-266, 315 >> 134-231, 252 >> 101-238 - 2h after  dinner: 174-267 >> 193-260, 322 >> 108-202, 266 >> 128-243 >> 184-235 >> n/c >> 111-214 >> 109-273, 339 - bedtime:118, 163-275 >> 111-242, 300 >> 147-223 >> 133-300 >> 191-260, 289 >> 140-216, 252 >> 126-378 - nighttime: 161-310 >> n/c >> 75-232 >> 136-291 >> n/c few lows - lowest 71 >> 119 >> 83 >> 58; she has hypoglycemia awareness at 95.  Highest sugar was 300s >> 262 >> 300s.  Pt's meals are: - Breakfast: 2 slices of bread + PB - Lunch: sandwich;  salad + cheese + croutons;  baked potato >> pinto beans + cornbread - Dinner: leftovers or corn bread + pinto beans + occas. Cole slaw >> salad + lite dressing + saltines Not soft drinks anymore. Snacks at night  - has mild CKD, last BUN/creatinine:  Lab Results  Component Value Date   BUN 15 11/10/2015   CREATININE 0.95 11/10/2015  She is back on Losartan. - pt has HL:  02/2015: TG 252 (PCP)  Lab Results  Component Value Date   CHOL 131 11/10/2015   HDL 31* 11/10/2015   LDLCALC 51 11/10/2015   TRIG 244* 11/10/2015   CHOLHDL 4.2 11/10/2015  She is on Fenofibrate. - last eye exam was in 02/20/2016 - Dr Zigmund Daniel. R>L DR. No changes.   - + numbness and tingling in her feet. On neurontin.  She also has a history  of HTN, HL, fatty liver, GERD, morbid obesity, hypothyroidism, migraines, chronic back pain, history of nephrolithiasis, osteoporosis, diverticulosis. She was recently dx'ed with OSA >> on CPAP.  Since had pancreatitis (03/21/2015).   She has been admitted for CP 11/13/2015 >> r/o for AMI. She saw Dr Sallyanne Kuster in the hospital, her cardiologist is Dr. Tamala Julian.  I reviewed pt's medications, allergies, PMH, social hx, family hx, and changes were documented in the history of present illness. Otherwise, unchanged from my initial visit note.  ROS: Constitutional: no weight gain/loss, + fatigue, no subjective hyperthermia/hypothermia Eyes: no blurry vision, no xerophthalmia ENT: no sore throat, no nodules palpated in throat, no  dysphagia/odynophagia, no hoarseness Cardiovascular: no CP/SOB/palpitations/leg swelling Respiratory: no cough/SOB/wheezing Gastrointestinal: no N/V/D/C Musculoskeletal: no muscle/joint aches Skin: no rashes Neurological: no tremors/numbness/tingling/dizziness  PE: BP 105/50 mmHg  Pulse 84  Ht 5\' 1"  (1.549 m)  Wt 221 lb (100.245 kg)  BMI 41.78 kg/m2  SpO2 94% Body mass index is 41.78 kg/(m^2). Wt Readings from Last 3 Encounters:  03/04/16 221 lb (100.245 kg)  11/23/15 224 lb 3.2 oz (101.696 kg)  11/13/15 224 lb 8 oz (101.833 kg)   Constitutional: overweight, in NAD Eyes: PERRLA, EOMI, no exophthalmos ENT: moist mucous membranes, no thyromegaly, no cervical lymphadenopathy Cardiovascular: RRR, No MRG Respiratory: CTA B Gastrointestinal: abdomen soft, NT, ND, BS+ Musculoskeletal: no deformities, strength intact in all 4 Skin: moist, warm, no rashes  ASSESSMENT: 1. DM2, insulin-dependent, uncontrolled, with complications - CAD - AMI 10/26/2013 - had angioplasty, no stent placed. Had 2DEcho on 01/23/2014 EF increased (from 35) to 55%. - peripheral neuropathy, on Neurontin - Gastroparesis, on Reglan - Dr. 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  PLAN:  1. Patient with long-standing diabetes - sugars still above goal at most times of the day, especially before meals and after dinner. Will decrease insulin with meals as she may get low at that times but also advised her to reduce her insulin if she stays active after a meal. As preprandial sugars are high >> will increase Lantus.  -  I suggested:  Patient Instructions  Please increase Lantus to 45 units 2x a day. May need to increase to 50 units 2x a day, if sugars still stay high after this.  Please use the following Humulin R doses:  - 35 units (small meal) - 40 units (regular meal)  - 45 units (larger meal)   Please return in 3 months with your sugar log.  - continue  checking sugars at different times of the day and write them down - she is doing a great job with this - she needs to check sugars 4x a day as she is a complex insulin regimen  - will skip checking fructosamine today >> check HbA1c >> 7.1% (better!) - Return to clinic in 3 mo with sugar log

## 2016-03-04 NOTE — Patient Instructions (Signed)
Please increase Lantus to 45 units 2x a day. May need to increase to 50 units 2x a day, if sugars still stay high after this.  Please use the following Humulin R doses:  - 35 units (small meal) - 40 units (regular meal)  - 45 units (larger meal)   Please return in 3 months with your sugar log.

## 2016-03-10 ENCOUNTER — Encounter (HOSPITAL_COMMUNITY): Payer: Self-pay | Admitting: Neurology

## 2016-03-10 ENCOUNTER — Emergency Department (HOSPITAL_COMMUNITY): Payer: Medicare Other

## 2016-03-10 ENCOUNTER — Inpatient Hospital Stay (HOSPITAL_COMMUNITY)
Admission: EM | Admit: 2016-03-10 | Discharge: 2016-03-14 | DRG: 040 | Disposition: A | Discharge status: Rehabilitation Facility | Payer: Medicare Other | Attending: Internal Medicine | Admitting: Internal Medicine

## 2016-03-10 ENCOUNTER — Inpatient Hospital Stay (HOSPITAL_COMMUNITY): Payer: Medicare Other

## 2016-03-10 DIAGNOSIS — Z7982 Long term (current) use of aspirin: Secondary | ICD-10-CM | POA: Diagnosis not present

## 2016-03-10 DIAGNOSIS — G43909 Migraine, unspecified, not intractable, without status migrainosus: Secondary | ICD-10-CM | POA: Diagnosis present

## 2016-03-10 DIAGNOSIS — R29810 Facial weakness: Secondary | ICD-10-CM | POA: Diagnosis present

## 2016-03-10 DIAGNOSIS — M545 Low back pain: Secondary | ICD-10-CM | POA: Diagnosis not present

## 2016-03-10 DIAGNOSIS — Z6841 Body Mass Index (BMI) 40.0 and over, adult: Secondary | ICD-10-CM

## 2016-03-10 DIAGNOSIS — E1143 Type 2 diabetes mellitus with diabetic autonomic (poly)neuropathy: Secondary | ICD-10-CM | POA: Diagnosis present

## 2016-03-10 DIAGNOSIS — I959 Hypotension, unspecified: Secondary | ICD-10-CM | POA: Diagnosis not present

## 2016-03-10 DIAGNOSIS — Z66 Do not resuscitate: Secondary | ICD-10-CM | POA: Diagnosis present

## 2016-03-10 DIAGNOSIS — I110 Hypertensive heart disease with heart failure: Secondary | ICD-10-CM | POA: Diagnosis present

## 2016-03-10 DIAGNOSIS — E139 Other specified diabetes mellitus without complications: Secondary | ICD-10-CM | POA: Diagnosis present

## 2016-03-10 DIAGNOSIS — Z880 Allergy status to penicillin: Secondary | ICD-10-CM | POA: Diagnosis not present

## 2016-03-10 DIAGNOSIS — G441 Vascular headache, not elsewhere classified: Secondary | ICD-10-CM | POA: Diagnosis not present

## 2016-03-10 DIAGNOSIS — Z7951 Long term (current) use of inhaled steroids: Secondary | ICD-10-CM | POA: Diagnosis not present

## 2016-03-10 DIAGNOSIS — Z794 Long term (current) use of insulin: Secondary | ICD-10-CM

## 2016-03-10 DIAGNOSIS — F329 Major depressive disorder, single episode, unspecified: Secondary | ICD-10-CM | POA: Diagnosis present

## 2016-03-10 DIAGNOSIS — R0682 Tachypnea, not elsewhere classified: Secondary | ICD-10-CM | POA: Insufficient documentation

## 2016-03-10 DIAGNOSIS — E11319 Type 2 diabetes mellitus with unspecified diabetic retinopathy without macular edema: Secondary | ICD-10-CM | POA: Diagnosis present

## 2016-03-10 DIAGNOSIS — I63411 Cerebral infarction due to embolism of right middle cerebral artery: Principal | ICD-10-CM | POA: Diagnosis present

## 2016-03-10 DIAGNOSIS — R531 Weakness: Secondary | ICD-10-CM

## 2016-03-10 DIAGNOSIS — I2510 Atherosclerotic heart disease of native coronary artery without angina pectoris: Secondary | ICD-10-CM | POA: Diagnosis present

## 2016-03-10 DIAGNOSIS — R29708 NIHSS score 8: Secondary | ICD-10-CM | POA: Diagnosis present

## 2016-03-10 DIAGNOSIS — G4733 Obstructive sleep apnea (adult) (pediatric): Secondary | ICD-10-CM | POA: Diagnosis not present

## 2016-03-10 DIAGNOSIS — N390 Urinary tract infection, site not specified: Secondary | ICD-10-CM | POA: Diagnosis present

## 2016-03-10 DIAGNOSIS — E785 Hyperlipidemia, unspecified: Secondary | ICD-10-CM | POA: Insufficient documentation

## 2016-03-10 DIAGNOSIS — Z9989 Dependence on other enabling machines and devices: Secondary | ICD-10-CM

## 2016-03-10 DIAGNOSIS — N3000 Acute cystitis without hematuria: Secondary | ICD-10-CM | POA: Diagnosis not present

## 2016-03-10 DIAGNOSIS — K3184 Gastroparesis: Secondary | ICD-10-CM | POA: Diagnosis present

## 2016-03-10 DIAGNOSIS — M546 Pain in thoracic spine: Secondary | ICD-10-CM | POA: Diagnosis not present

## 2016-03-10 DIAGNOSIS — E1340 Other specified diabetes mellitus with diabetic neuropathy, unspecified: Secondary | ICD-10-CM | POA: Diagnosis present

## 2016-03-10 DIAGNOSIS — E133399 Other specified diabetes mellitus with moderate nonproliferative diabetic retinopathy without macular edema, unspecified eye: Secondary | ICD-10-CM | POA: Diagnosis present

## 2016-03-10 DIAGNOSIS — R748 Abnormal levels of other serum enzymes: Secondary | ICD-10-CM | POA: Diagnosis present

## 2016-03-10 DIAGNOSIS — W06XXXA Fall from bed, initial encounter: Secondary | ICD-10-CM | POA: Diagnosis present

## 2016-03-10 DIAGNOSIS — K5900 Constipation, unspecified: Secondary | ICD-10-CM | POA: Diagnosis present

## 2016-03-10 DIAGNOSIS — E038 Other specified hypothyroidism: Secondary | ICD-10-CM | POA: Diagnosis not present

## 2016-03-10 DIAGNOSIS — I252 Old myocardial infarction: Secondary | ICD-10-CM | POA: Diagnosis not present

## 2016-03-10 DIAGNOSIS — E662 Morbid (severe) obesity with alveolar hypoventilation: Secondary | ICD-10-CM | POA: Diagnosis present

## 2016-03-10 DIAGNOSIS — R471 Dysarthria and anarthria: Secondary | ICD-10-CM | POA: Diagnosis present

## 2016-03-10 DIAGNOSIS — S2239XA Fracture of one rib, unspecified side, initial encounter for closed fracture: Secondary | ICD-10-CM | POA: Diagnosis present

## 2016-03-10 DIAGNOSIS — J45909 Unspecified asthma, uncomplicated: Secondary | ICD-10-CM | POA: Diagnosis present

## 2016-03-10 DIAGNOSIS — K219 Gastro-esophageal reflux disease without esophagitis: Secondary | ICD-10-CM | POA: Diagnosis present

## 2016-03-10 DIAGNOSIS — G8194 Hemiplegia, unspecified affecting left nondominant side: Secondary | ICD-10-CM | POA: Diagnosis present

## 2016-03-10 DIAGNOSIS — I63511 Cerebral infarction due to unspecified occlusion or stenosis of right middle cerebral artery: Secondary | ICD-10-CM | POA: Diagnosis not present

## 2016-03-10 DIAGNOSIS — I639 Cerebral infarction, unspecified: Secondary | ICD-10-CM | POA: Diagnosis present

## 2016-03-10 DIAGNOSIS — I10 Essential (primary) hypertension: Secondary | ICD-10-CM | POA: Diagnosis present

## 2016-03-10 DIAGNOSIS — E1142 Type 2 diabetes mellitus with diabetic polyneuropathy: Secondary | ICD-10-CM | POA: Diagnosis present

## 2016-03-10 DIAGNOSIS — Z886 Allergy status to analgesic agent status: Secondary | ICD-10-CM | POA: Diagnosis not present

## 2016-03-10 DIAGNOSIS — G936 Cerebral edema: Secondary | ICD-10-CM | POA: Diagnosis present

## 2016-03-10 DIAGNOSIS — Z888 Allergy status to other drugs, medicaments and biological substances status: Secondary | ICD-10-CM

## 2016-03-10 DIAGNOSIS — Z9889 Other specified postprocedural states: Secondary | ICD-10-CM | POA: Insufficient documentation

## 2016-03-10 DIAGNOSIS — R4781 Slurred speech: Secondary | ICD-10-CM | POA: Diagnosis present

## 2016-03-10 DIAGNOSIS — M7711 Lateral epicondylitis, right elbow: Secondary | ICD-10-CM | POA: Diagnosis not present

## 2016-03-10 DIAGNOSIS — N179 Acute kidney failure, unspecified: Secondary | ICD-10-CM | POA: Insufficient documentation

## 2016-03-10 DIAGNOSIS — Z833 Family history of diabetes mellitus: Secondary | ICD-10-CM | POA: Diagnosis not present

## 2016-03-10 DIAGNOSIS — E039 Hypothyroidism, unspecified: Secondary | ICD-10-CM | POA: Diagnosis present

## 2016-03-10 DIAGNOSIS — I5032 Chronic diastolic (congestive) heart failure: Secondary | ICD-10-CM | POA: Diagnosis present

## 2016-03-10 DIAGNOSIS — B964 Proteus (mirabilis) (morganii) as the cause of diseases classified elsewhere: Secondary | ICD-10-CM | POA: Diagnosis present

## 2016-03-10 DIAGNOSIS — Z981 Arthrodesis status: Secondary | ICD-10-CM

## 2016-03-10 DIAGNOSIS — I6789 Other cerebrovascular disease: Secondary | ICD-10-CM | POA: Diagnosis not present

## 2016-03-10 DIAGNOSIS — Z823 Family history of stroke: Secondary | ICD-10-CM | POA: Diagnosis not present

## 2016-03-10 DIAGNOSIS — R4182 Altered mental status, unspecified: Secondary | ICD-10-CM

## 2016-03-10 DIAGNOSIS — M549 Dorsalgia, unspecified: Secondary | ICD-10-CM | POA: Insufficient documentation

## 2016-03-10 LAB — URINE MICROSCOPIC-ADD ON

## 2016-03-10 LAB — URINALYSIS, ROUTINE W REFLEX MICROSCOPIC
Bilirubin Urine: NEGATIVE
Glucose, UA: 500 mg/dL — AB
Ketones, ur: 15 mg/dL — AB
Nitrite: POSITIVE — AB
Protein, ur: 30 mg/dL — AB
SPECIFIC GRAVITY, URINE: 1.025 (ref 1.005–1.030)
pH: 5.5 (ref 5.0–8.0)

## 2016-03-10 LAB — CBC WITH DIFFERENTIAL/PLATELET
Basophils Absolute: 0.1 10*3/uL (ref 0.0–0.1)
Basophils Relative: 1 %
EOS PCT: 2 %
Eosinophils Absolute: 0.2 10*3/uL (ref 0.0–0.7)
HCT: 38.5 % (ref 36.0–46.0)
HEMOGLOBIN: 12.1 g/dL (ref 12.0–15.0)
LYMPHS ABS: 2.3 10*3/uL (ref 0.7–4.0)
LYMPHS PCT: 24 %
MCH: 27.6 pg (ref 26.0–34.0)
MCHC: 31.4 g/dL (ref 30.0–36.0)
MCV: 87.7 fL (ref 78.0–100.0)
MONOS PCT: 5 %
Monocytes Absolute: 0.5 10*3/uL (ref 0.1–1.0)
Neutro Abs: 6.7 10*3/uL (ref 1.7–7.7)
Neutrophils Relative %: 68 %
Platelets: 293 10*3/uL (ref 150–400)
RBC: 4.39 MIL/uL (ref 3.87–5.11)
RDW: 16.5 % — ABNORMAL HIGH (ref 11.5–15.5)
WBC: 9.7 10*3/uL (ref 4.0–10.5)

## 2016-03-10 LAB — APTT: APTT: 25 s (ref 24–37)

## 2016-03-10 LAB — GLUCOSE, CAPILLARY
Glucose-Capillary: 175 mg/dL — ABNORMAL HIGH (ref 65–99)
Glucose-Capillary: 313 mg/dL — ABNORMAL HIGH (ref 65–99)

## 2016-03-10 LAB — RAPID URINE DRUG SCREEN, HOSP PERFORMED
Amphetamines: NOT DETECTED
BARBITURATES: NOT DETECTED
BENZODIAZEPINES: NOT DETECTED
COCAINE: NOT DETECTED
OPIATES: POSITIVE — AB
Tetrahydrocannabinol: NOT DETECTED

## 2016-03-10 LAB — COMPREHENSIVE METABOLIC PANEL
ALBUMIN: 3.6 g/dL (ref 3.5–5.0)
ALT: 30 U/L (ref 14–54)
AST: 38 U/L (ref 15–41)
Alkaline Phosphatase: 40 U/L (ref 38–126)
Anion gap: 8 (ref 5–15)
BUN: 24 mg/dL — AB (ref 6–20)
CHLORIDE: 109 mmol/L (ref 101–111)
CO2: 20 mmol/L — AB (ref 22–32)
CREATININE: 1.14 mg/dL — AB (ref 0.44–1.00)
Calcium: 9.5 mg/dL (ref 8.9–10.3)
GFR calc Af Amer: 58 mL/min — ABNORMAL LOW (ref >60)
GFR, EST NON AFRICAN AMERICAN: 50 mL/min — AB (ref >60)
Glucose, Bld: 266 mg/dL — ABNORMAL HIGH (ref 65–99)
POTASSIUM: 3.8 mmol/L (ref 3.5–5.1)
SODIUM: 137 mmol/L (ref 135–145)
Total Bilirubin: 0.8 mg/dL (ref 0.3–1.2)
Total Protein: 6.6 g/dL (ref 6.5–8.1)

## 2016-03-10 LAB — TROPONIN I
TROPONIN I: 0.1 ng/mL — AB (ref <0.03)
Troponin I: 0.09 ng/mL (ref <0.03)

## 2016-03-10 LAB — PROTIME-INR
INR: 1.21 (ref 0.00–1.49)
Prothrombin Time: 15.5 seconds — ABNORMAL HIGH (ref 11.6–15.2)

## 2016-03-10 LAB — CK: Total CK: 357 U/L — ABNORMAL HIGH (ref 38–234)

## 2016-03-10 MED ORDER — MONTELUKAST SODIUM 10 MG PO TABS
10.0000 mg | ORAL_TABLET | Freq: Every day | ORAL | Status: DC
  Administered 2016-03-10 – 2016-03-13 (×3): 10 mg via ORAL
  Filled 2016-03-10 (×3): qty 1

## 2016-03-10 MED ORDER — INSULIN GLARGINE 100 UNIT/ML ~~LOC~~ SOLN
22.0000 [IU] | Freq: Two times a day (BID) | SUBCUTANEOUS | Status: DC
  Administered 2016-03-10 – 2016-03-11 (×2): 22 [IU] via SUBCUTANEOUS
  Filled 2016-03-10 (×3): qty 0.22

## 2016-03-10 MED ORDER — ACETAMINOPHEN 650 MG RE SUPP: 650.0000 mg | RECTAL | Status: DC | PRN

## 2016-03-10 MED ORDER — CARVEDILOL 12.5 MG PO TABS
12.5000 mg | ORAL_TABLET | Freq: Two times a day (BID) | ORAL | Status: DC
  Administered 2016-03-11 – 2016-03-12 (×3): 12.5 mg via ORAL
  Filled 2016-03-10 (×3): qty 1

## 2016-03-10 MED ORDER — AMLODIPINE BESYLATE 5 MG PO TABS
5.0000 mg | ORAL_TABLET | Freq: Every day | ORAL | Status: DC
  Administered 2016-03-11: 5 mg via ORAL
  Filled 2016-03-10 (×2): qty 1

## 2016-03-10 MED ORDER — STROKE: EARLY STAGES OF RECOVERY BOOK
Freq: Once | Status: AC
  Administered 2016-03-10: 19:00:00
  Filled 2016-03-10 (×2): qty 1

## 2016-03-10 MED ORDER — INSULIN ASPART 100 UNIT/ML ~~LOC~~ SOLN
0.0000 [IU] | Freq: Three times a day (TID) | SUBCUTANEOUS | Status: DC
  Administered 2016-03-11: 9 [IU] via SUBCUTANEOUS
  Administered 2016-03-11: 2 [IU] via SUBCUTANEOUS
  Administered 2016-03-11: 5 [IU] via SUBCUTANEOUS

## 2016-03-10 MED ORDER — FENOFIBRATE 160 MG PO TABS
160.0000 mg | ORAL_TABLET | Freq: Every day | ORAL | Status: DC
  Administered 2016-03-12 – 2016-03-13 (×2): 160 mg via ORAL
  Filled 2016-03-10 (×2): qty 1

## 2016-03-10 MED ORDER — METOCLOPRAMIDE HCL 10 MG PO TABS
10.0000 mg | ORAL_TABLET | Freq: Four times a day (QID) | ORAL | Status: DC
  Administered 2016-03-10 – 2016-03-14 (×13): 10 mg via ORAL
  Filled 2016-03-10 (×16): qty 1

## 2016-03-10 MED ORDER — CLOPIDOGREL BISULFATE 75 MG PO TABS
75.0000 mg | ORAL_TABLET | Freq: Every day | ORAL | Status: DC
  Administered 2016-03-10 – 2016-03-14 (×4): 75 mg via ORAL
  Filled 2016-03-10 (×5): qty 1

## 2016-03-10 MED ORDER — ROSUVASTATIN CALCIUM 5 MG PO TABS
20.0000 mg | ORAL_TABLET | Freq: Every day | ORAL | Status: DC
  Administered 2016-03-11 – 2016-03-14 (×3): 20 mg via ORAL
  Filled 2016-03-10: qty 4
  Filled 2016-03-10: qty 1
  Filled 2016-03-10: qty 4
  Filled 2016-03-10 (×2): qty 1
  Filled 2016-03-10 (×2): qty 4

## 2016-03-10 MED ORDER — FUROSEMIDE 20 MG PO TABS: 20.0000 mg | ORAL_TABLET | Freq: Every day | ORAL | Status: DC

## 2016-03-10 MED ORDER — GABAPENTIN 300 MG PO CAPS
300.0000 mg | ORAL_CAPSULE | Freq: Three times a day (TID) | ORAL | Status: DC
  Administered 2016-03-10 – 2016-03-14 (×10): 300 mg via ORAL
  Filled 2016-03-10 (×11): qty 1

## 2016-03-10 MED ORDER — ACETAMINOPHEN 325 MG PO TABS
650.0000 mg | ORAL_TABLET | ORAL | Status: DC | PRN
  Administered 2016-03-11: 650 mg via ORAL
  Filled 2016-03-10: qty 2

## 2016-03-10 MED ORDER — LOSARTAN POTASSIUM 50 MG PO TABS
50.0000 mg | ORAL_TABLET | Freq: Two times a day (BID) | ORAL | Status: DC
  Administered 2016-03-11: 50 mg via ORAL
  Filled 2016-03-10: qty 1

## 2016-03-10 MED ORDER — LEVOTHYROXINE SODIUM 75 MCG PO TABS
75.0000 ug | ORAL_TABLET | Freq: Every day | ORAL | Status: DC
  Administered 2016-03-11 – 2016-03-12 (×2): 75 ug via ORAL
  Filled 2016-03-10 (×2): qty 1

## 2016-03-10 MED ORDER — SODIUM CHLORIDE 0.9 % IV SOLN: INTRAVENOUS | Status: DC

## 2016-03-10 MED ORDER — TOPIRAMATE 25 MG PO TABS
50.0000 mg | ORAL_TABLET | Freq: Two times a day (BID) | ORAL | Status: DC
  Administered 2016-03-10 – 2016-03-14 (×7): 50 mg via ORAL
  Filled 2016-03-10 (×7): qty 2

## 2016-03-10 MED ORDER — PANTOPRAZOLE SODIUM 40 MG PO TBEC
40.0000 mg | DELAYED_RELEASE_TABLET | Freq: Every day | ORAL | Status: DC
  Administered 2016-03-10 – 2016-03-14 (×4): 40 mg via ORAL
  Filled 2016-03-10 (×5): qty 1

## 2016-03-10 MED ORDER — ALBUTEROL SULFATE (2.5 MG/3ML) 0.083% IN NEBU: 2.5000 mg | INHALATION_SOLUTION | Freq: Four times a day (QID) | RESPIRATORY_TRACT | Status: DC | PRN

## 2016-03-10 MED ORDER — ALBUTEROL SULFATE HFA 108 (90 BASE) MCG/ACT IN AERS: 2.0000 | INHALATION_SPRAY | Freq: Four times a day (QID) | RESPIRATORY_TRACT | Status: DC | PRN

## 2016-03-10 MED ORDER — DEXTROSE 5 % IV SOLN
1.0000 g | INTRAVENOUS | Status: DC
  Administered 2016-03-10 – 2016-03-12 (×3): 1 g via INTRAVENOUS
  Filled 2016-03-10 (×4): qty 10

## 2016-03-10 MED ORDER — MOMETASONE FURO-FORMOTEROL FUM 200-5 MCG/ACT IN AERO
2.0000 | INHALATION_SPRAY | Freq: Two times a day (BID) | RESPIRATORY_TRACT | Status: DC
  Administered 2016-03-10 – 2016-03-14 (×6): 2 via RESPIRATORY_TRACT
  Filled 2016-03-10: qty 8.8

## 2016-03-10 NOTE — H&P (Signed)
History and Physical    Danielle Vargas [id number] DOB: Oct 19, 1950 DOA: 03/10/2016   PCP: Marton Redwood, MD   Patient coming from:  Home SNF  Rehab   Chief Complaint:  HPI: Danielle Vargas is a 65 y.o. female with  Extensive medical history listed below, brought to the ED with left-sided weakness. In review, the patient had suffered a fall 4 days prior,as she was walking to the bathroom in the early hours of the morning. At that time, she attributed this fall to left-sided weakness, and laid on the floor for about 6 hours until being found by her daughter; EMS was called, but the patient refused to come to the ER. Since that time, she was having mild left-sided weakness, worse this morning resulting on a second fall. Her daughter insisted to bring her to the ER for further evaluation. On presentation, she had left-sided weakness, slurred speech, and left facial droop. Patient  never had a similar episode.Deniess any history of TIA. Denies vertigo dizziness or vision changes. Denies headaches. No dysarthria. No dysphagia. No confusion. No seizures. Denies any chest pain, or shortness of breath. Denies any fever or chills, or night sweats.Does not smoke. No new meds or hormonal supplements. Does take a regular ASA a day, with no other antiplatelets or anticoagulants. Patient is compliant with his medications. Denies any recent long distance trips. No recent surgeries. No sick contacts. No new stressors present in personal life.Patient is not very active. She is diabetic. She reports family history of stroke in her father.  Patient may not administered TPA as is beyond time window for treatment consideration but awaiting Neuro input. Will admit for further evaluation and treatment.   ED Course:  BP 162/62 mmHg  Pulse 82  Temp(Src) 98.3 F (36.8 C) (Oral)  Resp 16  SpO2 95%  CT of the head without contrast  shows acute RMCA infarct involving the insular cortex and R frontal and temporal lobes. CT  spine negative for fractures. Glucose 266. Creatinine 1.14. CK 357. Review of Systems: As per HPI otherwise 10 point review of systems negative.   Past Medical History  Diagnosis Date  . DM (diabetes mellitus) ([place])   . Obesity   . Dyslipidemia   . HTN (hypertension)   . Hypothyroidism   . Gastroparesis   . Migraine   . Osteoporosis   . Colon polyps 7/07    hyperplastic and adenomatous  . Nephrolithiasis   . Diverticulosis   . External hemorrhoid   . Fatty liver   . Asthma   . CAD (coronary artery disease)     a. 12/2008 Cath: mild irregs throughout, EF 75%.  . 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 Allergy   . Cataract     "just beginning"  . Myocardial infarction ([place])     FEb 2015  . Depression     pt unsure, psychologist said no  . Sleep apnea     wears CPAP  . Hyperlipidemia     Past Surgical History  Procedure Laterality Date  . Tubal ligation  '85  . Breast reduction surgery  '88  . Cervical disc surgery  '98    fusion  . Carpal tunnel release  2000    right wrist  . Rotator cuff repair  '09    left shoulder  . Ulnar nerve repair  '02, '08    left done, then right  . Cesarean section  '79  '84  . Left heart catheterization with coronary angiogram  N/A 10/26/2013    Procedure: LEFT HEART CATHETERIZATION WITH CORONARY ANGIOGRAM;  Surgeon: Sinclair Grooms, MD;  Location: [facility] CATH LAB;  Service: Cardiovascular;  Laterality: N/A;  . Colonoscopy      Social History Social History   Social History  . Marital Status: Divorced    Spouse Name: N/A  . Number of Children: 2  . Years of Education: N/A   Occupational History  . disabled    Social History Main Topics  . Smoking status: Never Smoker   . Smokeless tobacco: Never Used  . Alcohol Use: No  . Drug Use: No  . Sexual Activity: Not Currently    Birth Control/ Protection: Post-menopausal   Other Topics Concern  . Not on file   Social History Narrative   Lives in [facility].  Retired.   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   Regular exercise: none   Caffeine use: daily; dt coke              Allergies  Allergen Reactions  . Invokana [Canagliflozin] Diarrhea  . Nsaids Other (See Comments)    GI problems/ stomach pain  . Penicillins Rash    Has patient had a PCN reaction causing immediate rash, facial/tongue/throat swelling, SOB or lightheadedness with hypotension: No (rash on legs after 3 days [place] -June 2016) Has patient had a PCN reaction causing severe rash involving mucus membranes or skin necrosis: No Has patient had a PCN reaction that required hospitalization No Has patient had a PCN reaction occurring within the last 10 years: Yes If all of the above answers are "NO", then may proceed with Cephalosporin use.    Family History  Problem Relation Age of Onset  . COPD Father   . Stroke Father   . Emphysema Father   . Heart failure Brother   . Heart failure Mother   . Heart attack Mother     several  . Diabetes Mother   . Kidney disease Mother   . Hypertension Brother   . Colon cancer  45    mat. 1st cousin  . Other      celiac sprue, 1/2 brother  . Diabetes Brother     x 3  . Lung cancer Brother   . Cirrhosis Sister     liver transplant  . Liver disease Sister     transplant  . Heart attack Paternal Grandfather   . Heart attack Brother   . Esophageal cancer Neg Hx   . Stomach cancer Neg Hx   . Rectal cancer Neg Hx   . Colon cancer Paternal Aunt 80      Prior to Admission medications   Medication Sig Start Date End Date Taking? Authorizing Provider  albuterol (PROVENTIL HFA;VENTOLIN HFA) 108 (90 BASE) MCG/ACT inhaler Inhale 2 puffs into the lungs every 6 (six) hours as needed for wheezing or shortness of breath.    Yes Historical Provider, MD  amLODipine (NORVASC) 5 MG tablet Take 5 mg by mouth daily.   Yes Historical Provider, MD  aspirin EC 81 MG tablet Take 81 mg by mouth daily.    Yes Historical Provider, MD    budesonide-formoterol (SYMBICORT) 160-4.5 MCG/ACT inhaler Inhale 2 puffs into the lungs 2 (two) times daily as needed (shortness of breath).   Yes Historical Provider, MD  calcium citrate-vitamin D (CITRACAL+D) 315-200 MG-UNIT tablet Take 1 tablet by mouth daily.   Yes Historical Provider, MD  carvedilol (COREG) 12.5 MG tablet Take 1  tablet (12.5 mg total) by mouth 2 (two) times daily with a meal. 02/28/16  Yes Belva Crome, MD  Coenzyme Q10 (CO Q 10) 100 MG CAPS Take 100 mg by mouth 2 (two) times daily.    Yes Historical Provider, MD  CRESTOR 20 MG tablet TAKE 1 TABLET EVERY DAY Patient taking differently: TAKE 1 TABLET EVERY DAY AT BEDTIME 04/24/14  Yes Belva Crome, MD  fenofibrate 160 MG tablet Take 160 mg by mouth at bedtime.    Yes Historical Provider, MD  furosemide (LASIX) 20 MG tablet Take 1 tablet (20 mg total) by mouth every morning. Patient taking differently: Take 20 mg by mouth daily.  11/13/14  Yes Belva Crome, MD  gabapentin (NEURONTIN) 300 MG capsule Take 300 mg by mouth 3 (three) times daily.   Yes Historical Provider, MD  HYDROcodone-acetaminophen (NORCO) 10-325 MG tablet Take 1 tablet by mouth 3 (three) times daily as needed (pain).  06/08/15  Yes Historical Provider, MD  insulin glargine (LANTUS) 100 UNIT/ML injection Inject into the skin 45 units in the morning and 45 units at bedtime. 03/04/16  Yes Philemon Kingdom, MD  insulin regular (HUMULIN R) 100 units/mL injection Inject 0.35-0.45 mLs (35-45 Units total) into the skin 3 (three) times daily before meals. Patient taking differently: Inject 45-50 Units into the skin 3 (three) times daily before meals.  03/04/16  Yes Philemon Kingdom, MD  Insulin Syringe-Needle U-100 (INSULIN SYRINGE 1CC/31GX5/16") 31G X 5/16" 1 ML MISC Use to inject insulin 5 times daily. 01/03/16  Yes Philemon Kingdom, MD  levothyroxine (SYNTHROID, LEVOTHROID) 75 MCG tablet Take 75 mcg by mouth daily.    Yes Historical Provider, MD  losartan (COZAAR) 50 MG  tablet Take 50 mg by mouth 2 (two) times daily.  01/29/15  Yes Historical Provider, MD  metFORMIN (GLUCOPHAGE) 1000 MG tablet Take 1,000 mg by mouth 2 (two) times daily with a meal.   Yes Historical Provider, MD  metoCLOPramide (REGLAN) 10 MG tablet Take 10 mg by mouth 4 (four) times daily.    Yes Historical Provider, MD  mometasone (NASONEX) 50 MCG/ACT nasal spray Place 2 sprays into the nose daily. Patient taking differently: Place 2 sprays into the nose daily as needed (allergies).  08/30/15  Yes Carmen Dohmeier, MD  montelukast (SINGULAIR) 10 MG tablet Take 10 mg by mouth at bedtime.   Yes Historical Provider, MD  Multiple Vitamin (MULTIVITAMIN WITH MINERALS) TABS tablet Take 1 tablet by mouth daily. One A Day 50+   Yes Historical Provider, MD  nitroGLYCERIN (NITROSTAT) 0.4 MG SL tablet Place 1 tablet (0.4 mg total) under the tongue every 5 (five) minutes as needed for chest pain. 10/29/13  Yes Rhonda G Barrett, PA-C  omeprazole (PRILOSEC) 20 MG capsule Take 20 mg by mouth 2 (two) times daily before a meal.   Yes Historical Provider, MD  pantoprazole (PROTONIX) 40 MG tablet Take 40 mg by mouth daily.   Yes Historical Provider, MD  polyethylene glycol (MIRALAX / GLYCOLAX) packet Take 17 g by mouth every other day.    Yes Historical Provider, MD  tiZANidine (ZANAFLEX) 2 MG tablet Take 2 mg by mouth every 8 (eight) hours as needed for muscle spasms.  10/26/15  Yes Historical Provider, MD  topiramate (TOPAMAX) 50 MG tablet Take 50 mg by mouth 2 (two) times daily.   Yes Historical Provider, MD  Vitamin D, Ergocalciferol, (DRISDOL) 50000 UNITS CAPS Take 50,000 Units by mouth every 7 (seven) days. Thursdays   Yes Historical  Provider, MD    Physical Exam:    Filed Vitals:   03/10/16 1500 03/10/16 1515 03/10/16 1530 03/10/16 1545  BP: 164/68 157/66 155/59 162/62  Pulse: 90 89 88 82  Temp:      TempSrc:      Resp: 16 18 18 16   SpO2: 97% 96% 96% 95%       Constitutional: NAD, calm, comfortable  . Falls asleep easily, but able to respond to verbal and tactile stimuli Filed Vitals:   03/10/16 1500 03/10/16 1515 03/10/16 1530 03/10/16 1545  BP: 164/68 157/66 155/59 162/62  Pulse: 90 89 88 82  Temp:      TempSrc:      Resp: 16 18 18 16   SpO2: 97% 96% 96% 95%   Eyes: PERRL, lids and conjunctivae normal. L ptosis ENMT: Mucous membranes are moist. Posterior pharynx clear of any exudate or lesions.Normal dentition. Neck: normal, supple, no masses, no thyromegaly Respiratory: clear to auscultation bilaterally, no wheezing, no crackles. Normal respiratory effort. No accessory muscle use.  Cardiovascular: Regular rate and rhythm, no murmurs / rubs / gallops. No extremity edema. 2+ pedal pulses. No carotid bruits.  Abdomen: obese no tenderness, no masses palpated. No hepatosplenomegaly. Bowel sounds positive.  Musculoskeletal: no clubbing / cyanosis. No joint deformity upper and lower extremities. Good ROM, no contractures. Normal muscle tone.  Skin: no rashes, lesions, ulcers.  Neurologic: LUE 1/5 strength, LLE 2/5, normal R side, Left facial weakness, ptosis and droop.  Normal R facial movements. Sensation intact.  Psychiatric: Normal judgment and insight. Alert and oriented x 3. Normal mood.     Labs on Admission: I have personally reviewed following labs and imaging studies  CBC:  Recent Labs Lab 03/10/16 1356  WBC 9.7  NEUTROABS 6.7  HGB 12.1  HCT 38.5  MCV 87.7  PLT 0000000    Basic Metabolic Panel:  Recent Labs Lab 03/10/16 1356  NA 137  K 3.8  CL 109  CO2 20*  GLUCOSE 266*  BUN 24*  CREATININE 1.14*  CALCIUM 9.5    GFR: Estimated Creatinine Clearance: 54.9 mL/min (by C-G formula based on Cr of 1.14).  Liver Function Tests:  Recent Labs Lab 03/10/16 1356  AST 38  ALT 30  ALKPHOS 40  BILITOT 0.8  PROT 6.6  ALBUMIN 3.6   No results for input(s): LIPASE, AMYLASE in the last 168 hours. No results for input(s): AMMONIA in the last 168  hours.  Coagulation Profile: No results for input(s): INR, PROTIME in the last 168 hours.  Cardiac Enzymes:  Recent Labs Lab 03/10/16 1356  CKTOTAL 357*    BNP (last 3 results) No results for input(s): PROBNP in the last 8760 hours.  HbA1C: No results for input(s): HGBA1C in the last 72 hours.  CBG: No results for input(s): GLUCAP in the last 168 hours.  Lipid Profile: No results for input(s): CHOL, HDL, LDLCALC, TRIG, CHOLHDL, LDLDIRECT in the last 72 hours.  Thyroid Function Tests: No results for input(s): TSH, T4TOTAL, FREET4, T3FREE, THYROIDAB in the last 72 hours.  Anemia Panel: No results for input(s): VITAMINB12, FOLATE, FERRITIN, TIBC, IRON, RETICCTPCT in the last 72 hours.  Urine analysis:  Sepsis Labs: @LABRCNTIP (procalcitonin:4,lacticidven:4) )No results found for this or any previous visit (from the past 240 hour(s)).   Radiological Exams on Admission: Ct Head Wo Contrast  03/10/2016  CLINICAL DATA:  Fall, left-sided weakness. EXAM: CT HEAD WITHOUT CONTRAST CT CERVICAL SPINE WITHOUT CONTRAST TECHNIQUE: Multidetector CT imaging of the head and cervical  spine was performed following the standard protocol without intravenous contrast. Multiplanar CT image reconstructions of the cervical spine were also generated. COMPARISON:  MRI brain 06/10/2006 FINDINGS: CT HEAD FINDINGS There is low-density throughout the insular cortex and adjacent temporal lobe and frontal lobe on the right compatible with acute infarct. No hemorrhage. No hydrocephalus or midline shift. No acute calvarial abnormality. CT CERVICAL SPINE FINDINGS Prior anterior fusion from C5-C7. Degenerative changes at see C4-5 and C7-T1. No fracture. No malalignment. Prevertebral soft tissues are normal. No epidural or paraspinal hematoma. IMPRESSION: Acute right MCA infarct involving the insular cortex and adjacent right frontal and temporal lobes. No hemorrhage. No acute bony abnormality in the cervical spine.  Critical Value/emergent results were called by telephone at the time of interpretation on 03/10/2016 at 2:04 pm to Dr. Theotis Burrow , who verbally acknowledged these results. Electronically Signed   By: Rolm Baptise M.D.   On: 03/10/2016 14:04   Ct Cervical Spine Wo Contrast  03/10/2016  CLINICAL DATA:  Fall, left-sided weakness. EXAM: CT HEAD WITHOUT CONTRAST CT CERVICAL SPINE WITHOUT CONTRAST TECHNIQUE: Multidetector CT imaging of the head and cervical spine was performed following the standard protocol without intravenous contrast. Multiplanar CT image reconstructions of the cervical spine were also generated. COMPARISON:  MRI brain 06/10/2006 FINDINGS: CT HEAD FINDINGS There is low-density throughout the insular cortex and adjacent temporal lobe and frontal lobe on the right compatible with acute infarct. No hemorrhage. No hydrocephalus or midline shift. No acute calvarial abnormality. CT CERVICAL SPINE FINDINGS Prior anterior fusion from C5-C7. Degenerative changes at see C4-5 and C7-T1. No fracture. No malalignment. Prevertebral soft tissues are normal. No epidural or paraspinal hematoma. IMPRESSION: Acute right MCA infarct involving the insular cortex and adjacent right frontal and temporal lobes. No hemorrhage. No acute bony abnormality in the cervical spine. Critical Value/emergent results were called by telephone at the time of interpretation on 03/10/2016 at 2:04 pm to Dr. Theotis Burrow , who verbally acknowledged these results. Electronically Signed   By: Rolm Baptise M.D.   On: 03/10/2016 14:04    EKG: Independently reviewed.  Assessment/Plan Principal Problem:   Ischemic stroke ([place]) Active Problems:   LADA (latent autoimmune diabetes in adults), managed as type 1 ([place])   HTN (hypertension)   Chronic diastolic heart failure (HCC)   CAD in native artery   Morbid obesity ([place])   Retinopathy due to secondary diabetes mellitus, without macular edema, with moderate nonproliferative retinopathy  (HCC)   Neuropathy due to secondary diabetes mellitus ([place])   Obesity hypoventilation syndrome (HCC)   OSA on CPAP   Hypothyroidism   Stroke (cerebrum) ([place])   Acute- left sided weakness /Acute RMCA CVA  May not be a TPA candidate as her last known normal was 4 days  prior to hospitalization. Neuro evaluated CT head which confirms acute RMCA infarct involving the insular cortex and R frontal and temporal lobes. CT spine negative for fractures. Neuro to consult soon. CK 357 (long lie synrome) Admitted to telemetry inpatient Stroke order set  -MRI/MRA brain carotid ultrasound -Echo  Allow permissive hypertension. May use hydralazine for blood pressure 210/110 -PT/OT/SLP  lipid panel Air overlay mattress  Anticoagulation plans are pending until Neuro  evaluation and MRI. SCDs for now Case management /Social Work, likely to need inpatient rehab  Diabetes Mellitus/ LADA  Current blood sugar level is 266  Lab Results  Component Value Date   HGBA1C 7.1 03/04/2016  SSI, Lantus  Heart healthy carb modified diet.  OSA  with hypersomnia / Asthma without acute exacerbation Continue CPAP Continue Dulera, inhalers, Singulair  Hypothyroidism: -Continue home Synthroid  Hypertension BP 162/62 mmHg  Pulse 82  Temp(Src) 98.3 F (36.8 C) (Oral)  Resp 16  SpO2 95% Controlled Continue home anti-hypertensive medications when ok with Neuro Add Hydralazine Q6 hours as needed for BP 210/110 as allowing permissive hypertension for now   Chronic diastolic heart failure, last echocardiogram with normal LVF, EF 55  Dry weight 100 kg , current weight 100.2 kg . Appears compensated.    - Careful use of IVF     I/O  CAD s/p MI 2015,  EKG, last cardiac catheterization 2015 without a stent  , patient is cardiac pain free at this time. Will continue ASA /statins and meds when ok with Neuro    Hyperlipidemia Continue home statins  DVT prophylaxis:  SCD's Code Status:   Full    Family  Communication:  Discussed with patient daughter Disposition Plan: Expect patient to be discharged to home after condition improves Consults called:    Neuro Dr. Yvetta Coder  Admission status: [place], PA-C [facility]   If 7PM-7AM, please contact night-coverage [email or web address] Password [email or web address]  03/10/2016, 4:16 PM

## 2016-03-10 NOTE — ED Notes (Signed)
Assisted Sarah, RN with in and out cath with success of obtaining an urine sample

## 2016-03-10 NOTE — ED Notes (Signed)
Myself and Janee', RN undressed patient, placed on a gown, on monitor, continuous pulse oximetry and blood pressure cuff

## 2016-03-10 NOTE — Progress Notes (Signed)
Received from ER via stretcher; oriented patient to room and unit routine. 

## 2016-03-10 NOTE — Consult Note (Signed)
Neurology Consultation Reason for Consult: Stroke  Referring Physician: Theotis Burrow, MD  CC: Fall, left-sided weakness  History is obtained from the patient who is a good historian. Her daughter is present at the bedside and offers additional history as needed.   HPI: Danielle Vargas is a 65 y.o. RH female who was in her usual state of health until the evening of 03/08/16. She reports that she woke up after going to bed so that she could go to the bathroom. She reports that she slid out of bed and fell to the floor because she was unable to support her weight. She was unable to get up and laid on her left side on the floor for about six hours before her daughter came by her home to check on her and discovered her on the floor. The patient initially thought that she could not get up because of an old left rotator cuff injury but later realized that she had some weakness involving the left arm and leg. She states that this weakness persisted. On the evening of 03/09/16 she fell again, this time while trying to get off the sofa to go to the bathroom. Again she was unable to get up on her own and remained on the floor for about one hour. Her daughter decided to bring her to the hospital for further evaluation.   The patient denies any injury to the head or neck with her falls. She also denies any orthopedic injury from the falls. Neither she nor her daughter have appreciated any frank facial weakness but her daughter does think the left side of her face is slightly swollen. The patient denies any numbness or tingling. She has no headache. She has some musculoskeletal neck pain since her fall but attributes this to the fact that she has a history or multilevel cervical fusion and had to lay on the floor for several hours. She denies any difficulty talking or swallowing. She has not had any vision changes, hearing loss, dizziness, or vertigo. She reports no prior history of stroke.    ROS: A 14 point ROS was  performed and is negative except as noted in the HPI.   Past Medical History  Diagnosis Date  . DM (diabetes mellitus) ([place])   . Obesity   . Dyslipidemia   . HTN (hypertension)   . Hypothyroidism   . Gastroparesis   . Migraine   . Osteoporosis   . Colon polyps 7/07    hyperplastic and adenomatous  . Nephrolithiasis   . Diverticulosis   . External hemorrhoid   . Fatty liver   . Asthma   . CAD (coronary artery disease)     a. 12/2008 Cath: mild irregs throughout, EF 75%.  . 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 Allergy   . Cataract     "just beginning"  . Myocardial infarction ([place])     FEb 2015  . Depression     pt unsure, psychologist said no  . Sleep apnea     wears CPAP  . Hyperlipidemia      Family History  Problem Relation Age of Onset  . COPD Father   . Stroke Father   . Emphysema Father   . Heart failure Brother   . Heart failure Mother   . Heart attack Mother     several  . Diabetes Mother   . Kidney disease Mother   . Hypertension Brother   . Colon cancer  45    mat. 1st  cousin  . Other      celiac sprue, 1/2 brother  . Diabetes Brother     x 3  . Lung cancer Brother   . Cirrhosis Sister     liver transplant  . Liver disease Sister     transplant  . Heart attack Paternal Grandfather   . Heart attack Brother   . Esophageal cancer Neg Hx   . Stomach cancer Neg Hx   . Rectal cancer Neg Hx   . Colon cancer Paternal Aunt 80     Social History: She currently lives alone and is independent. She denies any history of cigarette smoking and has never used smokeless tobacco. She denies any alcohol or illicit drug use.   Family History: Multiple family members (siblings, parents) with DM, HTN.  Exam: Current vital signs: BP 162/62 mmHg  Pulse 82  Temp(Src) 98.3 F (36.8 C) (Oral)  Resp 16  SpO2 95% Vital signs in last 24 hours: Temp:  [98.3 F (36.8 C)] 98.3 F (36.8 C) (07/03 1301) Pulse Rate:  [76-90] 82 (07/03 1545) Resp:  [16-18]  16 (07/03 1545) BP: (138-164)/(58-68) 162/62 mmHg (07/03 1545) SpO2:  [94 %-97 %] 95 % (07/03 1545)   Physical Exam  Constitutional: This is a well-developed obese Caucasian woman who is in no acute distress. She is alert and oriented x4. Speech is mildly dysarthric. She has no aphasia. Affect is bright and mood is congruent. Comportment is normal.  HEENT: Neck is supple with no lymphadenopathy. Sclerae are anicteric. Conjunctivae are slightly injected, left more that right. She has some mild periorbital swelling on the left as well as some possible soft tissue swelling of the left cheek. MMM, OP clear.  Cardiovascular: Normal rate and regular rhythm. No obvious murmur. No carotid bruits. Distal pulses 2+, symmetric.  Respiratory: Effort normal and breath sounds normal to anterior ascultation GI: Soft. Obese. No distension. There is no tenderness.   Neuro: Mental Status: Patient is awake, alert, oriented to person, place, month, year, and situation. She has no evidence of aphasia. She attends well to stimuli from both sides.  Cranial Nerves: II: Visual Fields are full to confrontation. Pupils are equal, round, and reactive to light.   III,IV, VI: EOMI without ptosis or diploplia.  V: Facial sensation is symmetric to light touch and pinprick. VII: Facial movement is symmetric.  VIII: hearing is intact to voice X: Uvula elevates symmetrically XI: Shoulder shrug is reduced on the left. XII: tongue is midline without atrophy or fasciculations.  Motor: Tone is normal. Bulk is normal. Strength is 5/5 on the right. In the RUE, extensors are 4/5, worse distally; RUE flexors are 5/5. In the RLE, flexors are 4/5 with extensors 5/5. Sensory: Sensation is symmetric to light touch and pinprick in the arms and legs.  Deep Tendon Reflexes: 2+, brisker on the left than the right. Ankle jerks are diminished bilaterally.  Plantars: Toes are downgoing on the right, mute on the left. Cerebellar: FNF and  HKS are limited by weakness on the left but without overt dysmetria. These are normal on the right.    I have reviewed labs in epic and the results pertinent to this consultation are: BMP notable for BUN 24 CK 357 Glucose 266 UA cloudy with glu 500, mod leuk esterase, positive nitrites, wbc's TNC  I have personally reviewed the [facility] without contrast from this visit. This shows focal areas of hypodensity in the right insula and adjacent right temporal and frontal lobes. No significant  hemorrhage is apparent. She has moderate chronic small vessel ischemic changes involving the bihemispheric white matter.   Impression:  1. Acute ischemic stroke, R MCA territory 2. Left hemiparesis 2/2 #1 3. Dysarthria 2/2 #1  Recommendations: 1. Admit to hospitalist service for stroke evaluation. Stroke team will f/u 7/4.  2. MRI brain  3. Carotid Dopplers 4. TTE 5. Check Hgb a1c, fasting lipids 6. Ensure tight control of glucose and avoid fever to minimize extension of stroke 7. Gradually lower BP to goal as needed. Avoid sudden drops to avoid extension of stroke. 8. PT/OT/ST to evaluate and treat 9. Since this stroke occurred while taking aspirin, recommend switching to alternative antiplatelet such as clopidogrel of dipyridamole-aspirin once cleared for POs. 10. Continue statin.   This was discussed with the patient and her daughter. They are in agreement with the plan as noted. They were given the opportunity to ask any questions and these were addressed to their satisfaction.   The stroke team will follow-up on the patient starting 03/11/16.    Melba Coon, MD [facility] [phone] 5:08 PM

## 2016-03-10 NOTE — ED Notes (Signed)
Patient transported to CT 

## 2016-03-10 NOTE — ED Notes (Signed)
Per ems- pt reports initial fall on Saturday, laid on the floor for 6 hours until her daughter could get there. Since the fall she has had left sided weakness. Has had 2 more falls since then. Family noticed slurred speech today and pt has left sided facial droop. Pt is a x 4. Left side is weak. BP 185/98,  CBG 267.

## 2016-03-10 NOTE — ED Provider Notes (Signed)
CSN: [id number]     Arrival date & time 03/10/16  1251 History   First MD Initiated Contact with Patient 03/10/16 1306     Chief Complaint  Patient presents with  . Weakness     (Consider location/radiation/quality/duration/timing/severity/associated sxs/prior Treatment) HPI Comments: 65 year old female with extensive past medical history including CAD, IDDM, HTN, HLD, OSA who presents with left-sided weakness. 2 days ago, the patient lost her footing while walking and fell onto her left side. She did not strike her head or lose consciousness. She was unable to get up and laid on her left side for proximally 6 hours before her daughter arrived and helped her get up. She noticed later in the day some left-sided weakness involving her left arm and left leg. She also felt cramping pain in her left arm. Her symptoms persisted yesterday and she had another fall and laid on her left side for approximately 1 hour. She again did not strike her head or lose consciousness. This morning, her daughter noticed slightly slurred speech and left-sided facial droop. Her symptoms seem to be worse today than they were 2 days ago. She denies any vomiting, abdominal pain, chest pain, shortness of breath, fevers, or recent illness. No headache, visual changes, or neck pain.  The history is provided by the patient and a relative.    Past Medical History  Diagnosis Date  . DM (diabetes mellitus) ([place])   . Obesity   . Dyslipidemia   . HTN (hypertension)   . Hypothyroidism   . Gastroparesis   . Migraine   . Osteoporosis   . Colon polyps 7/07    hyperplastic and adenomatous  . Nephrolithiasis   . Diverticulosis   . External hemorrhoid   . Fatty liver   . Asthma   . CAD (coronary artery disease)     a. 12/2008 Cath: mild irregs throughout, EF 75%.  . 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 Allergy   . Cataract     "just beginning"  . Myocardial infarction ([place])     FEb 2015  . Depression     pt unsure,  psychologist said no  . Sleep apnea     wears CPAP  . Hyperlipidemia    Past Surgical History  Procedure Laterality Date  . Tubal ligation  '85  . Breast reduction surgery  '88  . Cervical disc surgery  '98    fusion  . Carpal tunnel release  2000    right wrist  . Rotator cuff repair  '09    left shoulder  . Ulnar nerve repair  '02, '08    left done, then right  . Cesarean section  '79  '84  . Left heart catheterization with coronary angiogram N/A 10/26/2013    Procedure: LEFT HEART CATHETERIZATION WITH CORONARY ANGIOGRAM;  Surgeon: Sinclair Grooms, MD;  Location: [facility] CATH LAB;  Service: Cardiovascular;  Laterality: N/A;  . Colonoscopy     Family History  Problem Relation Age of Onset  . COPD Father   . Stroke Father   . Emphysema Father   . Heart failure Brother   . Heart failure Mother   . Heart attack Mother     several  . Diabetes Mother   . Kidney disease Mother   . Hypertension Brother   . Colon cancer  45    mat. 1st cousin  . Other      celiac sprue, 1/2 brother  . Diabetes Brother     x  3  . Lung cancer Brother   . Cirrhosis Sister     liver transplant  . Liver disease Sister     transplant  . Heart attack Paternal Grandfather   . Heart attack Brother   . Esophageal cancer Neg Hx   . Stomach cancer Neg Hx   . Rectal cancer Neg Hx   . Colon cancer Paternal Aunt 80   Social History  Substance Use Topics  . Smoking status: Never Smoker   . Smokeless tobacco: Never Used  . Alcohol Use: No   OB History    No data available     Review of Systems 10 Systems reviewed and are negative for acute change except as noted in the HPI.    Allergies  Invokana; Nsaids; and Penicillins  Home Medications   Prior to Admission medications   Medication Sig Start Date End Date Taking? Authorizing Provider  albuterol (PROVENTIL HFA;VENTOLIN HFA) 108 (90 BASE) MCG/ACT inhaler Inhale 2 puffs into the lungs every 6 (six) hours as needed for wheezing or  shortness of breath.    Yes Historical Provider, MD  amLODipine (NORVASC) 5 MG tablet Take 5 mg by mouth daily.   Yes Historical Provider, MD  aspirin EC 81 MG tablet Take 81 mg by mouth daily.    Yes Historical Provider, MD  budesonide-formoterol (SYMBICORT) 160-4.5 MCG/ACT inhaler Inhale 2 puffs into the lungs 2 (two) times daily as needed (shortness of breath).   Yes Historical Provider, MD  calcium citrate-vitamin D (CITRACAL+D) 315-200 MG-UNIT tablet Take 1 tablet by mouth daily.   Yes Historical Provider, MD  carvedilol (COREG) 12.5 MG tablet Take 1 tablet (12.5 mg total) by mouth 2 (two) times daily with a meal. 02/28/16  Yes Belva Crome, MD  Coenzyme Q10 (CO Q 10) 100 MG CAPS Take 100 mg by mouth 2 (two) times daily.    Yes Historical Provider, MD  CRESTOR 20 MG tablet TAKE 1 TABLET EVERY DAY Patient taking differently: TAKE 1 TABLET EVERY DAY AT BEDTIME 04/24/14  Yes Belva Crome, MD  fenofibrate 160 MG tablet Take 160 mg by mouth at bedtime.    Yes Historical Provider, MD  furosemide (LASIX) 20 MG tablet Take 1 tablet (20 mg total) by mouth every morning. Patient taking differently: Take 20 mg by mouth daily.  11/13/14  Yes Belva Crome, MD  gabapentin (NEURONTIN) 300 MG capsule Take 300 mg by mouth 3 (three) times daily.   Yes Historical Provider, MD  HYDROcodone-acetaminophen (NORCO) 10-325 MG tablet Take 1 tablet by mouth 3 (three) times daily as needed (pain).  06/08/15  Yes Historical Provider, MD  insulin glargine (LANTUS) 100 UNIT/ML injection Inject into the skin 45 units in the morning and 45 units at bedtime. 03/04/16  Yes Philemon Kingdom, MD  insulin regular (HUMULIN R) 100 units/mL injection Inject 0.35-0.45 mLs (35-45 Units total) into the skin 3 (three) times daily before meals. Patient taking differently: Inject 45-50 Units into the skin 3 (three) times daily before meals.  03/04/16  Yes Philemon Kingdom, MD  Insulin Syringe-Needle U-100 (INSULIN SYRINGE 1CC/31GX5/16") 31G X  5/16" 1 ML MISC Use to inject insulin 5 times daily. 01/03/16  Yes Philemon Kingdom, MD  levothyroxine (SYNTHROID, LEVOTHROID) 75 MCG tablet Take 75 mcg by mouth daily.    Yes Historical Provider, MD  losartan (COZAAR) 50 MG tablet Take 50 mg by mouth 2 (two) times daily.  01/29/15  Yes Historical Provider, MD  metFORMIN (GLUCOPHAGE) 1000 MG tablet  Take 1,000 mg by mouth 2 (two) times daily with a meal.   Yes Historical Provider, MD  metoCLOPramide (REGLAN) 10 MG tablet Take 10 mg by mouth 4 (four) times daily.    Yes Historical Provider, MD  mometasone (NASONEX) 50 MCG/ACT nasal spray Place 2 sprays into the nose daily. Patient taking differently: Place 2 sprays into the nose daily as needed (allergies).  08/30/15  Yes Carmen Dohmeier, MD  montelukast (SINGULAIR) 10 MG tablet Take 10 mg by mouth at bedtime.   Yes Historical Provider, MD  Multiple Vitamin (MULTIVITAMIN WITH MINERALS) TABS tablet Take 1 tablet by mouth daily. One A Day 50+   Yes Historical Provider, MD  nitroGLYCERIN (NITROSTAT) 0.4 MG SL tablet Place 1 tablet (0.4 mg total) under the tongue every 5 (five) minutes as needed for chest pain. 10/29/13  Yes Rhonda G Barrett, PA-C  omeprazole (PRILOSEC) 20 MG capsule Take 20 mg by mouth 2 (two) times daily before a meal.   Yes Historical Provider, MD  pantoprazole (PROTONIX) 40 MG tablet Take 40 mg by mouth daily.   Yes Historical Provider, MD  polyethylene glycol (MIRALAX / GLYCOLAX) packet Take 17 g by mouth every other day.    Yes Historical Provider, MD  tiZANidine (ZANAFLEX) 2 MG tablet Take 2 mg by mouth every 8 (eight) hours as needed for muscle spasms.  10/26/15  Yes Historical Provider, MD  topiramate (TOPAMAX) 50 MG tablet Take 50 mg by mouth 2 (two) times daily.   Yes Historical Provider, MD  Vitamin D, Ergocalciferol, (DRISDOL) 50000 UNITS CAPS Take 50,000 Units by mouth every 7 (seven) days. Thursdays   Yes Historical Provider, MD   BP 151/58 mmHg  Pulse 81  Temp(Src) 98.3 F  (36.8 C) (Oral)  Resp 16  SpO2 97% Physical Exam  Constitutional: She is oriented to person, place, and time. She appears well-developed and well-nourished. No distress.  Awake, alert  HENT:  Head: Normocephalic and atraumatic.  L facial droop involving mouth  Eyes: Conjunctivae and EOM are normal. Pupils are equal, round, and reactive to light.  Neck: Neck supple.  Cardiovascular: Normal rate, regular rhythm and normal heart sounds.   No murmur heard. Pulmonary/Chest: Effort normal and breath sounds normal. No respiratory distress.  Abdominal: Soft. Bowel sounds are normal. She exhibits no distension.  Musculoskeletal: She exhibits no edema.  Neurological: She is alert and oriented to person, place, and time. She has normal reflexes. No cranial nerve deficit. She exhibits normal muscle tone.  L facial droop, normal finger-to-nose testing R, unable to perform on L LUE 2/5, RUE 5/5, RLE 5/5, LLE 3/5; no clonus  normal sensation x all 4 extremities  Skin: Skin is warm and dry.  Psychiatric: She has a normal mood and affect. Judgment and thought content normal.  Nursing note and vitals reviewed.   ED Course  Procedures (including critical care time) Labs Review Labs Reviewed  COMPREHENSIVE METABOLIC PANEL  CBC WITH DIFFERENTIAL/PLATELET  URINALYSIS, ROUTINE W REFLEX MICROSCOPIC (NOT AT [facility])  CK    Imaging Review Ct Head Wo Contrast  03/10/2016  CLINICAL DATA:  Fall, left-sided weakness. EXAM: CT HEAD WITHOUT CONTRAST CT CERVICAL SPINE WITHOUT CONTRAST TECHNIQUE: Multidetector CT imaging of the head and cervical spine was performed following the standard protocol without intravenous contrast. Multiplanar CT image reconstructions of the cervical spine were also generated. COMPARISON:  MRI brain 06/10/2006 FINDINGS: CT HEAD FINDINGS There is low-density throughout the insular cortex and adjacent temporal lobe and frontal lobe on  the right compatible with acute infarct. No hemorrhage.  No hydrocephalus or midline shift. No acute calvarial abnormality. CT CERVICAL SPINE FINDINGS Prior anterior fusion from C5-C7. Degenerative changes at see C4-5 and C7-T1. No fracture. No malalignment. Prevertebral soft tissues are normal. No epidural or paraspinal hematoma. IMPRESSION: Acute right MCA infarct involving the insular cortex and adjacent right frontal and temporal lobes. No hemorrhage. No acute bony abnormality in the cervical spine. Critical Value/emergent results were called by telephone at the time of interpretation on 03/10/2016 at 2:04 pm to Dr. Theotis Burrow , who verbally acknowledged these results. Electronically Signed   By: Rolm Baptise M.D.   On: 03/10/2016 14:04   Ct Cervical Spine Wo Contrast  03/10/2016  CLINICAL DATA:  Fall, left-sided weakness. EXAM: CT HEAD WITHOUT CONTRAST CT CERVICAL SPINE WITHOUT CONTRAST TECHNIQUE: Multidetector CT imaging of the head and cervical spine was performed following the standard protocol without intravenous contrast. Multiplanar CT image reconstructions of the cervical spine were also generated. COMPARISON:  MRI brain 06/10/2006 FINDINGS: CT HEAD FINDINGS There is low-density throughout the insular cortex and adjacent temporal lobe and frontal lobe on the right compatible with acute infarct. No hemorrhage. No hydrocephalus or midline shift. No acute calvarial abnormality. CT CERVICAL SPINE FINDINGS Prior anterior fusion from C5-C7. Degenerative changes at see C4-5 and C7-T1. No fracture. No malalignment. Prevertebral soft tissues are normal. No epidural or paraspinal hematoma. IMPRESSION: Acute right MCA infarct involving the insular cortex and adjacent right frontal and temporal lobes. No hemorrhage. No acute bony abnormality in the cervical spine. Critical Value/emergent results were called by telephone at the time of interpretation on 03/10/2016 at 2:04 pm to Dr. Theotis Burrow , who verbally acknowledged these results. Electronically Signed   By: Rolm Baptise M.D.   On: 03/10/2016 14:04   I have personally reviewed and evaluated these images and lab results as part of my medical decision-making.   EKG Interpretation   Date/Time:  Monday March 10 2016 13:00:41 EDT Ventricular Rate:  79 PR Interval:    QRS Duration: 100 QT Interval:  442 QTC Calculation: 507 R Axis:   -51 Text Interpretation:  Sinus rhythm LAD, consider left anterior fascicular  block Abnormal R-wave progression, late transition Borderline T  abnormalities, anterior leads Prolonged QT interval No significant change  since last tracing Confirmed by LITTLE MD, RACHEL [id number]) on 03/10/2016  2:06:29 PM      MDM   Final diagnoses:  Acute right MCA stroke [facility])   Patient with 2 days of left-sided weakness and several falls that occurred at home. No loss of consciousness. She was awake and alert, no acute distress at presentation. Left facial droop and left-sided weakness involving left arm and left leg. Obtained above lab work including CK. Labs showed creatinine 1.14, glucose 266, normal CBC, CK 357. UA pending. Obtained CT of head which showed acute right MCA infarct. I discussed with neurology, Dr. Apolonio Schneiders, who will see the patient in consultation. Discussed admission with [facility] and patient admitted for further stroke workup.  Sharlett Iles, MD 03/10/16 [phone]

## 2016-03-11 ENCOUNTER — Inpatient Hospital Stay (HOSPITAL_COMMUNITY): Payer: Medicare Other

## 2016-03-11 ENCOUNTER — Encounter (HOSPITAL_COMMUNITY): Payer: Medicare Other

## 2016-03-11 DIAGNOSIS — I6789 Other cerebrovascular disease: Secondary | ICD-10-CM

## 2016-03-11 DIAGNOSIS — M549 Dorsalgia, unspecified: Secondary | ICD-10-CM | POA: Insufficient documentation

## 2016-03-11 DIAGNOSIS — I10 Essential (primary) hypertension: Secondary | ICD-10-CM

## 2016-03-11 DIAGNOSIS — I63511 Cerebral infarction due to unspecified occlusion or stenosis of right middle cerebral artery: Secondary | ICD-10-CM

## 2016-03-11 DIAGNOSIS — I2510 Atherosclerotic heart disease of native coronary artery without angina pectoris: Secondary | ICD-10-CM

## 2016-03-11 DIAGNOSIS — E785 Hyperlipidemia, unspecified: Secondary | ICD-10-CM

## 2016-03-11 DIAGNOSIS — N179 Acute kidney failure, unspecified: Secondary | ICD-10-CM

## 2016-03-11 DIAGNOSIS — E039 Hypothyroidism, unspecified: Secondary | ICD-10-CM

## 2016-03-11 DIAGNOSIS — E662 Morbid (severe) obesity with alveolar hypoventilation: Secondary | ICD-10-CM

## 2016-03-11 DIAGNOSIS — G4733 Obstructive sleep apnea (adult) (pediatric): Secondary | ICD-10-CM

## 2016-03-11 DIAGNOSIS — Z9889 Other specified postprocedural states: Secondary | ICD-10-CM

## 2016-03-11 DIAGNOSIS — E669 Obesity, unspecified: Secondary | ICD-10-CM

## 2016-03-11 DIAGNOSIS — M546 Pain in thoracic spine: Secondary | ICD-10-CM

## 2016-03-11 DIAGNOSIS — R0682 Tachypnea, not elsewhere classified: Secondary | ICD-10-CM

## 2016-03-11 DIAGNOSIS — I639 Cerebral infarction, unspecified: Secondary | ICD-10-CM

## 2016-03-11 LAB — BASIC METABOLIC PANEL
Anion gap: 8 (ref 5–15)
BUN: 20 mg/dL (ref 6–20)
CO2: 21 mmol/L — ABNORMAL LOW (ref 22–32)
CREATININE: 1.07 mg/dL — AB (ref 0.44–1.00)
Calcium: 9.2 mg/dL (ref 8.9–10.3)
Chloride: 110 mmol/L (ref 101–111)
GFR, EST NON AFRICAN AMERICAN: 54 mL/min — AB (ref >60)
Glucose, Bld: 203 mg/dL — ABNORMAL HIGH (ref 65–99)
Potassium: 3.8 mmol/L (ref 3.5–5.1)
SODIUM: 139 mmol/L (ref 135–145)

## 2016-03-11 LAB — GLUCOSE, CAPILLARY
GLUCOSE-CAPILLARY: 258 mg/dL — AB (ref 65–99)
GLUCOSE-CAPILLARY: 358 mg/dL — AB (ref 65–99)
GLUCOSE-CAPILLARY: 388 mg/dL — AB (ref 65–99)
Glucose-Capillary: 194 mg/dL — ABNORMAL HIGH (ref 65–99)

## 2016-03-11 LAB — CBC
HCT: 39 % (ref 36.0–46.0)
Hemoglobin: 12.2 g/dL (ref 12.0–15.0)
MCH: 27.9 pg (ref 26.0–34.0)
MCHC: 31.3 g/dL (ref 30.0–36.0)
MCV: 89 fL (ref 78.0–100.0)
PLATELETS: 272 10*3/uL (ref 150–400)
RBC: 4.38 MIL/uL (ref 3.87–5.11)
RDW: 16.3 % — AB (ref 11.5–15.5)
WBC: 7.2 10*3/uL (ref 4.0–10.5)

## 2016-03-11 LAB — ECHOCARDIOGRAM COMPLETE
LVOT area: 3.14 cm2
LVOT diameter: 20 mm

## 2016-03-11 LAB — LIPID PANEL
CHOLESTEROL: 138 mg/dL (ref 0–200)
HDL: 29 mg/dL — ABNORMAL LOW (ref >40)
LDL Cholesterol: 63 mg/dL (ref 0–99)
TRIGLYCERIDES: 232 mg/dL — AB (ref <150)
Total CHOL/HDL Ratio: 4.8 RATIO
VLDL: 46 mg/dL — ABNORMAL HIGH (ref 0–40)

## 2016-03-11 LAB — TROPONIN I: TROPONIN I: 0.08 ng/mL — AB (ref <0.03)

## 2016-03-11 LAB — TSH: TSH: 10.113 u[IU]/mL — AB (ref 0.350–4.500)

## 2016-03-11 MED ORDER — INSULIN GLARGINE 100 UNIT/ML ~~LOC~~ SOLN
35.0000 [IU] | Freq: Two times a day (BID) | SUBCUTANEOUS | Status: DC
  Administered 2016-03-11: 35 [IU] via SUBCUTANEOUS
  Filled 2016-03-11 (×3): qty 0.35

## 2016-03-11 MED ORDER — INSULIN ASPART 100 UNIT/ML ~~LOC~~ SOLN
8.0000 [IU] | Freq: Once | SUBCUTANEOUS | Status: AC
  Administered 2016-03-11: 8 [IU] via SUBCUTANEOUS

## 2016-03-11 MED ORDER — INSULIN ASPART 100 UNIT/ML ~~LOC~~ SOLN
10.0000 [IU] | Freq: Three times a day (TID) | SUBCUTANEOUS | Status: DC
  Administered 2016-03-12 – 2016-03-13 (×5): 10 [IU] via SUBCUTANEOUS

## 2016-03-11 MED ORDER — INSULIN ASPART 100 UNIT/ML ~~LOC~~ SOLN
0.0000 [IU] | Freq: Three times a day (TID) | SUBCUTANEOUS | Status: DC
  Administered 2016-03-12 – 2016-03-13 (×4): 8 [IU] via SUBCUTANEOUS
  Administered 2016-03-13 – 2016-03-14 (×2): 3 [IU] via SUBCUTANEOUS
  Administered 2016-03-14 (×2): 5 [IU] via SUBCUTANEOUS

## 2016-03-11 MED ORDER — HYDROCODONE-ACETAMINOPHEN 10-325 MG PO TABS
1.0000 | ORAL_TABLET | Freq: Three times a day (TID) | ORAL | Status: DC | PRN
  Administered 2016-03-11 (×2): 1 via ORAL
  Filled 2016-03-11 (×2): qty 1

## 2016-03-11 MED ORDER — INSULIN ASPART 100 UNIT/ML ~~LOC~~ SOLN
0.0000 [IU] | Freq: Every day | SUBCUTANEOUS | Status: DC
  Administered 2016-03-13: 2 [IU] via SUBCUTANEOUS

## 2016-03-11 MED ORDER — POLYETHYLENE GLYCOL 3350 17 G PO PACK
17.0000 g | PACK | ORAL | Status: DC
  Administered 2016-03-13 (×2): 17 g via ORAL
  Filled 2016-03-11 (×2): qty 1

## 2016-03-11 MED ORDER — TIZANIDINE HCL 2 MG PO TABS
2.0000 mg | ORAL_TABLET | Freq: Three times a day (TID) | ORAL | Status: DC | PRN
  Administered 2016-03-11: 2 mg via ORAL
  Filled 2016-03-11 (×3): qty 1

## 2016-03-11 MED ORDER — NITROGLYCERIN 0.4 MG SL SUBL: 0.4000 mg | SUBLINGUAL_TABLET | SUBLINGUAL | Status: DC | PRN

## 2016-03-11 MED ORDER — HYDROCODONE-ACETAMINOPHEN 10-325 MG PO TABS: 1.0000 | ORAL_TABLET | Freq: Three times a day (TID) | ORAL | Status: DC | PRN

## 2016-03-11 MED ORDER — IOPAMIDOL (ISOVUE-370) INJECTION 76%
INTRAVENOUS | Status: AC
  Administered 2016-03-11: 50 mL
  Filled 2016-03-11: qty 50

## 2016-03-11 MED ORDER — HYDROCODONE-ACETAMINOPHEN 5-325 MG PO TABS
1.0000 | ORAL_TABLET | Freq: Three times a day (TID) | ORAL | Status: DC | PRN
  Administered 2016-03-12 – 2016-03-14 (×5): 1 via ORAL
  Filled 2016-03-11 (×6): qty 1

## 2016-03-11 NOTE — Progress Notes (Signed)
STROKE TEAM PROGRESS NOTE   HISTORY OF PRESENT ILLNESS (per record) Danielle Vargas is a 65 y.o. RH female who was in her usual state of health until the evening of 03/08/16 (LKW). She reports that she woke up after going to bed so that she could go to the bathroom. She reports that she slid out of bed and fell to the floor because she was unable to support her weight. She was unable to get up and laid on her left side on the floor for about six hours before her daughter came by her home to check on her and discovered her on the floor. The patient initially thought that she could not get up because of an old left rotator cuff injury but later realized that she had some weakness involving the left arm and leg. She states that this weakness persisted. On the evening of 03/09/16 she fell again, this time while trying to get off the sofa to go to the bathroom. Again she was unable to get up on her own and remained on the floor for about one hour. Her daughter decided to bring her to the hospital for further evaluation.   The patient denies any injury to the head or neck with her falls. She also denies any orthopedic injury from the falls. Neither she nor her daughter have appreciated any frank facial weakness but her daughter does think the left side of her face is slightly swollen. The patient denies any numbness or tingling. She has no headache. She has some musculoskeletal neck pain since her fall but attributes this to the fact that she has a history or multilevel cervical fusion and had to lay on the floor for several hours. She denies any difficulty talking or swallowing. She has not had any vision changes, hearing loss, dizziness, or vertigo. She reports no prior history of stroke.   Patient was not administered IV t-PA secondary to delay in arrival. She was admitted for further evaluation and treatment.   SUBJECTIVE (INTERVAL HISTORY) Her daughter is at the bedside. She is lying in the bed, awake,  talkative. Patient has working with OT this am - they recommend CIR, arm weakness worse than leg per daughter. Still has right facial droop. Overall she feels her condition is stable. Due to embolic pattern stroke, will do TEE and loop recorder.    OBJECTIVE Temp:  [97.9 F (36.6 C)-99 F (37.2 C)] 99 F (37.2 C) (07/04 1013) Pulse Rate:  [76-101] 88 (07/04 1013) Cardiac Rhythm:  [-] Normal sinus rhythm (07/04 0700) Resp:  [15-20] 18 (07/04 1013) BP: (138-186)/(58-89) 161/63 mmHg (07/04 1013) SpO2:  [94 %-100 %] 100 % (07/04 1013)  CBC:   Recent Labs Lab 03/10/16 1356 03/11/16 0507  WBC 9.7 7.2  NEUTROABS 6.7  --   HGB 12.1 12.2  HCT 38.5 39.0  MCV 87.7 89.0  PLT 293 [id number]    Basic Metabolic Panel:   Recent Labs Lab 03/10/16 1356 03/11/16 0507  NA 137 139  K 3.8 3.8  CL 109 110  CO2 20* 21*  GLUCOSE 266* 203*  BUN 24* 20  CREATININE 1.14* 1.07*  CALCIUM 9.5 9.2    Lipid Panel:     Component Value Date/Time   CHOL 138 03/11/2016 0507   TRIG 232* 03/11/2016 0507   HDL 29* 03/11/2016 0507   CHOLHDL 4.8 03/11/2016 0507   VLDL 46* 03/11/2016 0507   LDLCALC 63 03/11/2016 0507   HgbA1c:  Lab Results  Component Value Date  HGBA1C 7.1 03/04/2016   Urine Drug Screen:     Component Value Date/Time   LABOPIA POSITIVE* 03/10/2016 1500   COCAINSCRNUR NONE DETECTED 03/10/2016 1500   LABBENZ NONE DETECTED 03/10/2016 1500   AMPHETMU NONE DETECTED 03/10/2016 1500   THCU NONE DETECTED 03/10/2016 1500   LABBARB NONE DETECTED 03/10/2016 1500      IMAGING I have personally reviewed the radiological images below and agree with the radiology interpretations.  Dg Chest 2 View 03/10/2016   No active cardiopulmonary disease. Stable cardiomegaly.   Ct Head Wo Contrast 03/10/2016   Acute right MCA infarct involving the insular cortex and adjacent right frontal and temporal lobes. No hemorrhage.   Ct Cervical Spine Wo Contrast 03/10/2016  No acute bony abnormality in the  cervical spine.   Mr Brain Wo Contrast 03/10/2016   Extensive areas of both focal and confluent restricted diffusion throughout the RIGHT MCA distribution representing acute nonhemorrhagic infarction. Suspected RIGHT M1 MCA long segment stenosis or occlusion. Atrophy with small vessel disease. Areas of chronic infarction, most notable LEFT insula and LEFT cerebellum.   CTA head and neck 03/11/2016 Non stenotic extracranial carotid bifurcation disease, consisting of calcific plaque. Severe stenosis of the RIGHT MCA M2 superior division vessel, corresponding with the observed areas of infarction. Developing nonhemorrhagic cytotoxic edema, RIGHT hemisphere, without significant mass effect.  TTE - - Very limited study. Only a few parasternal window images were  obtained, after which the patient was unable to tolerate the  study.  The left ventricle appears normal in overall size and function,  but wall motion cannot be analyzed.  The left atrium is athe upper limit of normal in size.  The mitral valve appears grossly normal.  The other valves cannot be evaluated. The right heart chambers  are not seen. Probably no pericardial effusion.  TEE - pending  LE venous doppler - pending   PHYSICAL EXAM  Temp:  [97.9 F (36.6 C)-99 F (37.2 C)] 98.7 F (37.1 C) (07/04 1407) Pulse Rate:  [80-101] 87 (07/04 1407) Resp:  [15-20] 18 (07/04 1407) BP: (140-203)/(59-97) 155/97 mmHg (07/04 1407) SpO2:  [96 %-100 %] 97 % (07/04 1407)  General - Well nourished, well developed, in no apparent distress.  Ophthalmologic - Fundi not visualized due to noncooperation.  Cardiovascular - Regular rate and rhythm.  Mental Status -  Level of arousal and orientation to time, place, and person were intact. Language including expression, naming, repetition, comprehension was assessed and found intact. Fund of Knowledge was assessed and was intact.  Cranial Nerves II - XII - II - Visual field intact  OU. III, IV, VI - Extraocular movements intact. V - Facial sensation intact bilaterally. VII - left facial droop. VIII - Hearing & vestibular intact bilaterally. X - Palate elevates symmetrically. XI - Chin turning & shoulder shrug intact bilaterally. XII - Tongue protrusion intact.  Motor Strength - The patient's strength was in all extremities except 4+/5 LUE and pronator drift was present on the left  Bulk was normal and fasciculations were absent.   Motor Tone - Muscle tone was assessed at the neck and appendages and was normal.  Reflexes - The patient's reflexes were 1+ in all extremities and she had no pathological reflexes.  Sensory - Light touch, temperature/pinprick were assessed and were symmetrical except decreased on the left UE, 80% of right.    Coordination - The patient had normal movements in the hands with no ataxia or dysmetria.  Tremor was absent.  Gait  and Station - deferred to PT in room.   ASSESSMENT/PLAN [name] is a 65 y.o. female with history of HTN, DB, HLD, CAD, MI, obesity, migraine, OSA presenting with L sided weakness. She did not receive IV t-PA due to delay in arrival.   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 Suspect R M1 stenosis/occlusion. Old infarct L insula and L cerebellum.  CTA head & neck  nonstenotic extracranial disease. Severe stenosis R M2 superior branch.   2D Echo limited study   LE venous doppler pending  TEE to look for embolic source. Arranged with [place] for tomorrow. (I have made patient NPO after midnight tonight). Cardiology will inform if they are unable to do Wed (due to holiday scheduling) If TEE negative, a [place] electrophysiologist will consult and consider placement of an implantable loop recorder to evaluate for atrial fibrillation  as etiology of stroke. This has been explained to patient/family by Dr. Erlinda Hong and they are agreeable.   LDL 63  HgbA1c 7.1  SCDs for VTE prophylaxis Diet Carb Modified Fluid consistency:: Thin; Room service appropriate?: Yes  aspirin 81 mg daily prior to admission, changed to clopidogrel 75 mg daily.  Patient counseled to be compliant with her antithrombotic medications  Ongoing aggressive stroke risk factor management  Therapy recommendations:  CIR. Will place consult  Disposition:  pending   Hypertension  Stable  Permissive hypertension (OK if < 220/120) but gradually normalize in 5-7 days  Long-term BP goal normotensive  Hyperlipidemia  Home meds:  crestor 20 and finofibrate 160, resumed in hospital  LDL 63, goal < 70  Continue statin and finofibrate at discharge  Diabetes  HgbA1c 7.1, goal < 7.0  Relatively controlled  Follow up with PCP  Other Stroke Risk Factors  Obesity, recommend weight loss, diet and exercise as appropriate   Family hx stroke (father)  CAD / MI in 2015  Migraines on topomax  OSA, on CPAP at home  Other Active Problems  Hypothyroidism  Hospital day # 1  Rosalin Hawking, MD PhD Stroke Neurology 03/11/2016 5:40 PM   To contact Stroke Continuity provider, please refer to [email or web address] After hours, contact General Neurology

## 2016-03-11 NOTE — Progress Notes (Signed)
SLP Cancellation Note  Patient Details Name: Danielle Vargas MRN: [id number] DOB: May 02, 1951   Cancelled treatment:       Reason Eval/Treat Not Completed: SLP screened. Pt has passed RN stroke swallow screen and has been initiated on regular diet. Per discussion with RN, will defer bedside swallow evaluation at this time per protocol. Please re-order if needed.   Given that pt has had acute CVA, recommend SLP cognitive-linguistic evaluation. MD, please order if in agreement.   Germain Osgood, M.A. CCC-SLP [phone]  Germain Osgood 03/11/2016, 10:18 AM

## 2016-03-11 NOTE — Progress Notes (Signed)
Patient with increased drowsiness and left sided weakness. Pt with slurred speech that was not present this am. MD paged and STAT CT ordered. CBG 388. 8 units of Novolog given. Pt resting at this time. Rapid at bedside when pt was transferred to CT. Monitoring closely. Xochitl Egle, Rande Brunt, RN

## 2016-03-11 NOTE — Consult Note (Signed)
Physical Medicine and Rehabilitation Consult Reason for Consult: Right MCA territory infarct Referring Physician: [facility]   HPI: Danielle Vargas is a 65 y.o. right handed female with history of hypertension, obesity, hyperlipidemia and left rotator cuff repair. Per chart review patient lives alone and was independent prior to admission and active. One level apartment. She has a daughter in the area that works. Presented 03/10/2016 with left-sided weakness after a fall from bed. She denied any loss of consciousness MRI of the brain showed extensive areas of both focal and confluent restricted diffusion throughout the right MCA distribution. CTA suspect right M1-MCA long segment stenosis or occlusion. 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 Patient did not receive TPA. Echocardiogram completed with limited study. The left ventricle appeared normal in overall size and function. TEE relatively unremarkable. Neurology consulted currently on Plavix for CVA prophylaxis. Occupational therapy evaluation completed 03/11/2016 with recommendations of physical medicine rehabilitation consult.   Review of Systems  Constitutional: Negative for fever and chills.  Eyes: Negative for blurred vision and double vision.  Respiratory: Negative for cough and shortness of breath.   Cardiovascular: Negative for chest pain, palpitations and leg swelling.  Gastrointestinal: Positive for constipation. Negative for nausea and vomiting.       GERD  Musculoskeletal: Positive for myalgias.  Skin: Negative for rash.  Neurological: Positive for speech change, focal weakness, weakness and headaches. Negative for sensory change, seizures and loss of consciousness.  Psychiatric/Behavioral: Positive for depression.  All other systems reviewed and are negative.  Past Medical History  Diagnosis Date  . DM (diabetes mellitus) ([place])   . Obesity   . Dyslipidemia   . HTN (hypertension)   .  Hypothyroidism   . Gastroparesis   . Migraine   . Osteoporosis   . Colon polyps 7/07    hyperplastic and adenomatous  . Nephrolithiasis   . Diverticulosis   . External hemorrhoid   . Fatty liver   . Asthma   . CAD (coronary artery disease)     a. 12/2008 Cath: mild irregs throughout, EF 75%.  . 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 Allergy   . Cataract     "just beginning"  . Myocardial infarction ([place])     FEb 2015  . Depression     pt unsure, psychologist said no  . Sleep apnea     wears CPAP  . Hyperlipidemia    Past Surgical History  Procedure Laterality Date  . Tubal ligation  '85  . Breast reduction surgery  '88  . Cervical disc surgery  '98    fusion  . Carpal tunnel release  2000    right wrist  . Rotator cuff repair  '09    left shoulder  . Ulnar nerve repair  '02, '08    left done, then right  . Cesarean section  '79  '84  . Left heart catheterization with coronary angiogram N/A 10/26/2013    Procedure: LEFT HEART CATHETERIZATION WITH CORONARY ANGIOGRAM;  Surgeon: Sinclair Grooms, MD;  Location: [facility] CATH LAB;  Service: Cardiovascular;  Laterality: N/A;  . Colonoscopy     Family History  Problem Relation Age of Onset  . COPD Father   . Stroke Father   . Emphysema Father   . Heart failure Brother   . Heart failure Mother   . Heart attack Mother     several  . Diabetes Mother   . Kidney disease Mother   .  Hypertension Brother   . Colon cancer  45    mat. 1st cousin  . Other      celiac sprue, 1/2 brother  . Diabetes Brother     x 3  . Lung cancer Brother   . Cirrhosis Sister     liver transplant  . Liver disease Sister     transplant  . Heart attack Paternal Grandfather   . Heart attack Brother   . Esophageal cancer Neg Hx   . Stomach cancer Neg Hx   . Rectal cancer Neg Hx   . Colon cancer Paternal Aunt 80   Social History:  reports that she has never smoked. She has never used smokeless tobacco. She reports that she does not drink  alcohol or use illicit drugs. Allergies:  Allergies  Allergen Reactions  . Invokana [Canagliflozin] Diarrhea  . Nsaids Other (See Comments)    GI problems/ stomach pain  . Penicillins Rash    Has patient had a PCN reaction causing immediate rash, facial/tongue/throat swelling, SOB or lightheadedness with hypotension: No (rash on legs after 3 days [place] -June 2016) Has patient had a PCN reaction causing severe rash involving mucus membranes or skin necrosis: No Has patient had a PCN reaction that required hospitalization No Has patient had a PCN reaction occurring within the last 10 years: Yes If all of the above answers are "NO", then may proceed with Cephalosporin use.   Medications Prior to Admission  Medication Sig Dispense Refill  . albuterol (PROVENTIL HFA;VENTOLIN HFA) 108 (90 BASE) MCG/ACT inhaler Inhale 2 puffs into the lungs every 6 (six) hours as needed for wheezing or shortness of breath.     Marland Kitchen amLODipine (NORVASC) 5 MG tablet Take 5 mg by mouth daily.    Marland Kitchen aspirin EC 81 MG tablet Take 81 mg by mouth daily.     . budesonide-formoterol (SYMBICORT) 160-4.5 MCG/ACT inhaler Inhale 2 puffs into the lungs 2 (two) times daily as needed (shortness of breath).    . calcium citrate-vitamin D (CITRACAL+D) 315-200 MG-UNIT tablet Take 1 tablet by mouth daily.    . carvedilol (COREG) 12.5 MG tablet Take 1 tablet (12.5 mg total) by mouth 2 (two) times daily with a meal. 180 tablet 0  . Coenzyme Q10 (CO Q 10) 100 MG CAPS Take 100 mg by mouth 2 (two) times daily.     . CRESTOR 20 MG tablet TAKE 1 TABLET EVERY DAY (Patient taking differently: TAKE 1 TABLET EVERY DAY AT BEDTIME) 90 tablet 1  . fenofibrate 160 MG tablet Take 160 mg by mouth at bedtime.     . furosemide (LASIX) 20 MG tablet Take 1 tablet (20 mg total) by mouth every morning. (Patient taking differently: Take 20 mg by mouth daily. ) 90 tablet 3  . gabapentin (NEURONTIN) 300 MG capsule Take 300 mg by mouth 3 (three) times daily.    Marland Kitchen  HYDROcodone-acetaminophen (NORCO) 10-325 MG tablet Take 1 tablet by mouth 3 (three) times daily as needed (pain).   0  . insulin glargine (LANTUS) 100 UNIT/ML injection Inject into the skin 45 units in the morning and 45 units at bedtime. 90 mL 1  . insulin regular (HUMULIN R) 100 units/mL injection Inject 0.35-0.45 mLs (35-45 Units total) into the skin 3 (three) times daily before meals. (Patient taking differently: Inject 45-50 Units into the skin 3 (three) times daily before meals. ) 140 mL 1  . Insulin Syringe-Needle U-100 (INSULIN SYRINGE 1CC/31GX5/16") 31G X 5/16" 1 ML MISC Use  to inject insulin 5 times daily. 500 each 3  . levothyroxine (SYNTHROID, LEVOTHROID) 75 MCG tablet Take 75 mcg by mouth daily.     Marland Kitchen losartan (COZAAR) 50 MG tablet Take 50 mg by mouth 2 (two) times daily.     . metFORMIN (GLUCOPHAGE) 1000 MG tablet Take 1,000 mg by mouth 2 (two) times daily with a meal.    . metoCLOPramide (REGLAN) 10 MG tablet Take 10 mg by mouth 4 (four) times daily.     . mometasone (NASONEX) 50 MCG/ACT nasal spray Place 2 sprays into the nose daily. (Patient taking differently: Place 2 sprays into the nose daily as needed (allergies). ) 17 g 12  . montelukast (SINGULAIR) 10 MG tablet Take 10 mg by mouth at bedtime.    . Multiple Vitamin (MULTIVITAMIN WITH MINERALS) TABS tablet Take 1 tablet by mouth daily. One A Day 50+    . nitroGLYCERIN (NITROSTAT) 0.4 MG SL tablet Place 1 tablet (0.4 mg total) under the tongue every 5 (five) minutes as needed for chest pain. 25 tablet 3  . omeprazole (PRILOSEC) 20 MG capsule Take 20 mg by mouth 2 (two) times daily before a meal.    . pantoprazole (PROTONIX) 40 MG tablet Take 40 mg by mouth daily.    . polyethylene glycol (MIRALAX / GLYCOLAX) packet Take 17 g by mouth every other day.     Marland Kitchen tiZANidine (ZANAFLEX) 2 MG tablet Take 2 mg by mouth every 8 (eight) hours as needed for muscle spasms.     Marland Kitchen topiramate (TOPAMAX) 50 MG tablet Take 50 mg by mouth 2 (two) times  daily.    . Vitamin D, Ergocalciferol, (DRISDOL) 50000 UNITS CAPS Take 50,000 Units by mouth every 7 (seven) days. Thursdays      Home: Home Living Family/patient expects to be discharged to:: Private residence Living Arrangements: Alone Type of Home: Apartment Home Access: Level entry Home Layout: One level Bathroom Shower/Tub: Multimedia programmer: Handicapped height Home Equipment: None Additional Comments: drives , play a game called "hay day"   Functional History: Prior Function Level of Independence: Independent Functional Status:  Mobility: Bed Mobility General bed mobility comments: in chair on arrival Transfers Overall transfer level: Needs assistance Equipment used: 1 person hand held assist Transfers: Sit to/from Stand Sit to Stand: Min assist General transfer comment: requires use of bil Ue to push up into standing      ADL: ADL Overall ADL's : Needs assistance/impaired Eating/Feeding: Set up, Sitting Eating/Feeding Details (indicate cue type and reason): daugther present and cutting up food for patient. Pt able to use R hand to self feed Grooming: Wash/dry hands, Set up, Sitting Grooming Details (indicate cue type and reason): incr effort for L Ue ot meet R Ue Toilet Transfer: Minimal assistance, Ambulation, BSC Toilet Transfer Details (indicate cue type and reason): Pt reaching for environmental supports. pt could benefit from trial of RW Toileting- Water quality scientist and Hygiene: Min guard, Sitting/lateral lean Functional mobility during ADLs: Minimal assistance General ADL Comments: Pt provided hand held (A) to ambulate to bathroom. pt with more of a noticeable limp to gait exiting bathroom and pt reports fatigue. Pt could benefit from trial of RW. Pt will have children (A) upon d/c home  Cognition: Cognition Overall Cognitive Status: Within Functional Limits for tasks assessed Orientation Level: Oriented X4 Cognition Arousal/Alertness:  Awake/alert Behavior During Therapy: WFL for tasks assessed/performed Overall Cognitive Status: Within Functional Limits for tasks assessed  Blood pressure 161/63, pulse 88, temperature 99  F (37.2 C), temperature source Oral, resp. rate 18, SpO2 100 %. Physical Exam  Vitals reviewed. Constitutional: She is oriented to person, place, and time. She appears well-developed and well-nourished.  HENT:  Head: Normocephalic and atraumatic.  Eyes: Right eye exhibits no discharge. Left eye exhibits no discharge.  Pupils round and reactive to light  Neck: Normal range of motion. Neck supple. No thyromegaly present.  Cardiovascular: Normal rate and regular rhythm.   Respiratory: Effort normal and breath sounds normal. No respiratory distress.  GI: Soft. Bowel sounds are normal. She exhibits no distension.  Musculoskeletal: She exhibits no edema or tenderness.  Neurological: She is alert and oriented to person, place, and time.  Follow simple commands.  Fair awareness of deficits Lethargy from recent sedation. Left facial weakness. Sensation intact to light touch DTRs symmetric Motor: RUE: 5/5 proximal to distal RLE: Hip flexion 3+/5, knee extension 4/5, ankle dorsi/plantar flexion 5/5 LUE/LLE: 0/5  Skin: Skin is warm and dry.  Psychiatric:  Unable to assess due to lethargy from recent sedation    Results for orders placed or performed during the hospital encounter of 03/10/16 (from the past 24 hour(s))  Comprehensive metabolic panel     Status: Abnormal   Collection Time: 03/10/16  1:56 PM  Result Value Ref Range   Sodium 137 135 - 145 mmol/L   Potassium 3.8 3.5 - 5.1 mmol/L   Chloride 109 101 - 111 mmol/L   CO2 20 (L) 22 - 32 mmol/L   Glucose, Bld 266 (H) 65 - 99 mg/dL   BUN 24 (H) 6 - 20 mg/dL   Creatinine, Ser 1.14 (H) 0.44 - 1.00 mg/dL   Calcium 9.5 8.9 - 10.3 mg/dL   Total Protein 6.6 6.5 - 8.1 g/dL   Albumin 3.6 3.5 - 5.0 g/dL   AST 38 15 - 41 U/L   ALT 30 14 - 54 U/L    Alkaline Phosphatase 40 38 - 126 U/L   Total Bilirubin 0.8 0.3 - 1.2 mg/dL   GFR calc non Af Amer 50 (L) >60 mL/min   GFR calc Af Amer 58 (L) >60 mL/min   Anion gap 8 5 - 15  CBC with Differential     Status: Abnormal   Collection Time: 03/10/16  1:56 PM  Result Value Ref Range   WBC 9.7 4.0 - 10.5 K/uL   RBC 4.39 3.87 - 5.11 MIL/uL   Hemoglobin 12.1 12.0 - 15.0 g/dL   HCT 38.5 36.0 - 46.0 %   MCV 87.7 78.0 - 100.0 fL   MCH 27.6 26.0 - 34.0 pg   MCHC 31.4 30.0 - 36.0 g/dL   RDW 16.5 (H) 11.5 - 15.5 %   Platelets 293 150 - 400 K/uL   Neutrophils Relative % 68 %   Neutro Abs 6.7 1.7 - 7.7 K/uL   Lymphocytes Relative 24 %   Lymphs Abs 2.3 0.7 - 4.0 K/uL   Monocytes Relative 5 %   Monocytes Absolute 0.5 0.1 - 1.0 K/uL   Eosinophils Relative 2 %   Eosinophils Absolute 0.2 0.0 - 0.7 K/uL   Basophils Relative 1 %   Basophils Absolute 0.1 0.0 - 0.1 K/uL  CK     Status: Abnormal   Collection Time: 03/10/16  1:56 PM  Result Value Ref Range   Total CK 357 (H) 38 - 234 U/L  Urinalysis, Routine w reflex microscopic     Status: Abnormal   Collection Time: 03/10/16  3:00 PM  Result Value Ref  Range   Color, Urine YELLOW YELLOW   APPearance CLOUDY (A) CLEAR   Specific Gravity, Urine 1.025 1.005 - 1.030   pH 5.5 5.0 - 8.0   Glucose, UA 500 (A) NEGATIVE mg/dL   Hgb urine dipstick MODERATE (A) NEGATIVE   Bilirubin Urine NEGATIVE NEGATIVE   Ketones, ur 15 (A) NEGATIVE mg/dL   Protein, ur 30 (A) NEGATIVE mg/dL   Nitrite POSITIVE (A) NEGATIVE   Leukocytes, UA MODERATE (A) NEGATIVE  Urine microscopic-add on     Status: Abnormal   Collection Time: 03/10/16  3:00 PM  Result Value Ref Range   Squamous Epithelial / LPF 0-5 (A) NONE SEEN   WBC, UA TOO NUMEROUS TO COUNT 0 - 5 WBC/hpf   RBC / HPF 0-5 0 - 5 RBC/hpf   Bacteria, UA MANY (A) NONE SEEN   Casts HYALINE CASTS (A) NEGATIVE  Urine rapid drug screen (hosp performed)not at [facility]     Status: Abnormal   Collection Time: 03/10/16  3:00 PM    Result Value Ref Range   Opiates POSITIVE (A) NONE DETECTED   Cocaine NONE DETECTED NONE DETECTED   Benzodiazepines NONE DETECTED NONE DETECTED   Amphetamines NONE DETECTED NONE DETECTED   Tetrahydrocannabinol NONE DETECTED NONE DETECTED   Barbiturates NONE DETECTED NONE DETECTED  Protime-INR     Status: Abnormal   Collection Time: 03/10/16  4:48 PM  Result Value Ref Range   Prothrombin Time 15.5 (H) 11.6 - 15.2 seconds   INR 1.21 0.00 - 1.49  APTT     Status: None   Collection Time: 03/10/16  4:48 PM  Result Value Ref Range   aPTT 25 24 - 37 seconds  Troponin I     Status: Abnormal   Collection Time: 03/10/16  4:48 PM  Result Value Ref Range   Troponin I 0.10 (HH) <0.03 ng/mL  Glucose, capillary     Status: Abnormal   Collection Time: 03/10/16  5:49 PM  Result Value Ref Range   Glucose-Capillary 175 (H) 65 - 99 mg/dL   Comment 1 Notify RN    Comment 2 Document in Chart   Glucose, capillary     Status: Abnormal   Collection Time: 03/10/16 10:24 PM  Result Value Ref Range   Glucose-Capillary 313 (H) 65 - 99 mg/dL   Comment 1 Notify RN    Comment 2 Document in Chart   Troponin I (q 6hr x 3)     Status: Abnormal   Collection Time: 03/10/16 11:10 PM  Result Value Ref Range   Troponin I 0.09 (HH) <0.03 ng/mL  Basic metabolic panel     Status: Abnormal   Collection Time: 03/11/16  5:07 AM  Result Value Ref Range   Sodium 139 135 - 145 mmol/L   Potassium 3.8 3.5 - 5.1 mmol/L   Chloride 110 101 - 111 mmol/L   CO2 21 (L) 22 - 32 mmol/L   Glucose, Bld 203 (H) 65 - 99 mg/dL   BUN 20 6 - 20 mg/dL   Creatinine, Ser 1.07 (H) 0.44 - 1.00 mg/dL   Calcium 9.2 8.9 - 10.3 mg/dL   GFR calc non Af Amer 54 (L) >60 mL/min   GFR calc Af Amer >60 >60 mL/min   Anion gap 8 5 - 15  CBC     Status: Abnormal   Collection Time: 03/11/16  5:07 AM  Result Value Ref Range   WBC 7.2 4.0 - 10.5 K/uL   RBC 4.38 3.87 - 5.11 MIL/uL  Hemoglobin 12.2 12.0 - 15.0 g/dL   HCT 39.0 36.0 - 46.0 %   MCV  89.0 78.0 - 100.0 fL   MCH 27.9 26.0 - 34.0 pg   MCHC 31.3 30.0 - 36.0 g/dL   RDW 16.3 (H) 11.5 - 15.5 %   Platelets 272 150 - 400 K/uL  Troponin I (q 6hr x 3)     Status: Abnormal   Collection Time: 03/11/16  5:07 AM  Result Value Ref Range   Troponin I 0.08 (HH) <0.03 ng/mL  Lipid panel     Status: Abnormal   Collection Time: 03/11/16  5:07 AM  Result Value Ref Range   Cholesterol 138 0 - 200 mg/dL   Triglycerides 232 (H) <150 mg/dL   HDL 29 (L) >40 mg/dL   Total CHOL/HDL Ratio 4.8 RATIO   VLDL 46 (H) 0 - 40 mg/dL   LDL Cholesterol 63 0 - 99 mg/dL  Glucose, capillary     Status: Abnormal   Collection Time: 03/11/16  6:36 AM  Result Value Ref Range   Glucose-Capillary 194 (H) 65 - 99 mg/dL   Comment 1 Notify RN    Comment 2 Document in Chart    Ct Angio Head W Or Wo Contrast  03/11/2016  CLINICAL DATA:  Continued surveillance RIGHT hemisphere infarction. LEFT hemiparesis. EXAM: CT ANGIOGRAPHY HEAD AND NECK TECHNIQUE: Multidetector CT imaging of the head and neck was performed using the standard protocol during bolus administration of intravenous contrast. Multiplanar CT image reconstructions and MIPs were obtained to evaluate the vascular anatomy. Carotid stenosis measurements (when applicable) are obtained utilizing NASCET criteria, using the distal internal carotid diameter as the denominator. CONTRAST:  Isovue 370, 50 mL. COMPARISON:  MRI brain 03/10/2016.  Noncontrast CT head 03/10/2016. FINDINGS: CT HEAD Calvarium and skull base: No fracture or destructive lesion. Mastoids and middle ears are grossly clear. Paranasal sinuses: Imaged portions are clear. Orbits: Negative. Brain: No hemorrhage is seen. Cytotoxic edema is now developing in the areas of restricted diffusion noted on MR. No hemorrhagic transformation. No midline shift. Chronic atrophic change, stable. White matter hypoattenuation favored to represent small vessel disease. CTA NECK Aortic arch: Standard branching. Imaged  portion shows no evidence of aneurysm or dissection. No significant stenosis of the major arch vessel origins. Right carotid system: Calcific plaque at the bifurcation extending into the RIGHT ICA. No evidence of dissection, stenosis (50% or greater) or occlusion. Left carotid system: Calcific plaque at the bifurcation, extending into the LEFT ICA No evidence of dissection, stenosis (50% or greater) or occlusion. Vertebral arteries: LEFT vertebral dominant. RIGHT vertebral diminutive no stenosis in the neck. No evidence of dissection, stenosis (50% or greater) or occlusion. Nonvascular soft tissues: No masses. Spondylosis. No lung apex lesion. Mediastinal lipomatosis reflecting increased body habitus. Previous cervical fusion [id number] appears uncomplicated. CTA HEAD Anterior circulation: No significant cavernous ICA disease. LEFT anterior circulation normal. On the RIGHT, there is an early bifurcation of the M1 MCA. There is severe stenosis of the superior M2 segment corresponding to the areas of acute infarction. See image 228 series 402. The inferior M2 segment demonstrates mild stenosis at its origin, probably not flow reducing. No aneurysm, or vascular malformation. Posterior circulation: LEFT vertebral dominant. RIGHT vertebral has a diminutive connection to the basilar, but primarily supplies the RIGHT PICA. No significant stenosis, proximal occlusion, aneurysm, or vascular malformation. Venous sinuses: As permitted by contrast timing, patent. Anatomic variants: None of significance. Delayed phase:   No abnormal intracranial enhancement. IMPRESSION: Non  stenotic extracranial carotid bifurcation disease, consisting of calcific plaque. Severe stenosis of the RIGHT MCA M2 superior division vessel, corresponding with the observed areas of infarction. Developing nonhemorrhagic cytotoxic edema, RIGHT hemisphere, without significant mass effect. Electronically Signed   By: Staci Righter M.D.   On: 03/11/2016 09:29    Dg Chest 2 View  03/10/2016  CLINICAL DATA:  Left lower chest pain beginning after landing on left side for 6 hours. Weakness. EXAM: CHEST  2 VIEW COMPARISON:  11/10/2015 FINDINGS: Lungs are adequately inflated without focal consolidation or effusion. Stable cardiomegaly. There are degenerative changes of the spine. Partially visualized fusion hardware over the cervical spine intact. Surgical change of the left AC joint. Prominent overlying soft tissues. IMPRESSION: No active cardiopulmonary disease. Stable cardiomegaly. Electronically Signed   By: Marin Olp M.D.   On: 03/10/2016 17:25   Ct Head Wo Contrast  03/10/2016  CLINICAL DATA:  Fall, left-sided weakness. EXAM: CT HEAD WITHOUT CONTRAST CT CERVICAL SPINE WITHOUT CONTRAST TECHNIQUE: Multidetector CT imaging of the head and cervical spine was performed following the standard protocol without intravenous contrast. Multiplanar CT image reconstructions of the cervical spine were also generated. COMPARISON:  MRI brain 06/10/2006 FINDINGS: CT HEAD FINDINGS There is low-density throughout the insular cortex and adjacent temporal lobe and frontal lobe on the right compatible with acute infarct. No hemorrhage. No hydrocephalus or midline shift. No acute calvarial abnormality. CT CERVICAL SPINE FINDINGS Prior anterior fusion from C5-C7. Degenerative changes at see C4-5 and C7-T1. No fracture. No malalignment. Prevertebral soft tissues are normal. No epidural or paraspinal hematoma. IMPRESSION: Acute right MCA infarct involving the insular cortex and adjacent right frontal and temporal lobes. No hemorrhage. No acute bony abnormality in the cervical spine. Critical Value/emergent results were called by telephone at the time of interpretation on 03/10/2016 at 2:04 pm to Dr. Theotis Burrow , who verbally acknowledged these results. Electronically Signed   By: Rolm Baptise M.D.   On: 03/10/2016 14:04   Ct Angio Neck W Or Wo Contrast  03/11/2016  CLINICAL DATA:   Continued surveillance RIGHT hemisphere infarction. LEFT hemiparesis. EXAM: CT ANGIOGRAPHY HEAD AND NECK TECHNIQUE: Multidetector CT imaging of the head and neck was performed using the standard protocol during bolus administration of intravenous contrast. Multiplanar CT image reconstructions and MIPs were obtained to evaluate the vascular anatomy. Carotid stenosis measurements (when applicable) are obtained utilizing NASCET criteria, using the distal internal carotid diameter as the denominator. CONTRAST:  Isovue 370, 50 mL. COMPARISON:  MRI brain 03/10/2016.  Noncontrast CT head 03/10/2016. FINDINGS: CT HEAD Calvarium and skull base: No fracture or destructive lesion. Mastoids and middle ears are grossly clear. Paranasal sinuses: Imaged portions are clear. Orbits: Negative. Brain: No hemorrhage is seen. Cytotoxic edema is now developing in the areas of restricted diffusion noted on MR. No hemorrhagic transformation. No midline shift. Chronic atrophic change, stable. White matter hypoattenuation favored to represent small vessel disease. CTA NECK Aortic arch: Standard branching. Imaged portion shows no evidence of aneurysm or dissection. No significant stenosis of the major arch vessel origins. Right carotid system: Calcific plaque at the bifurcation extending into the RIGHT ICA. No evidence of dissection, stenosis (50% or greater) or occlusion. Left carotid system: Calcific plaque at the bifurcation, extending into the LEFT ICA No evidence of dissection, stenosis (50% or greater) or occlusion. Vertebral arteries: LEFT vertebral dominant. RIGHT vertebral diminutive no stenosis in the neck. No evidence of dissection, stenosis (50% or greater) or occlusion. Nonvascular soft tissues: No masses. Spondylosis.  No lung apex lesion. Mediastinal lipomatosis reflecting increased body habitus. Previous cervical fusion Q000111Q appears uncomplicated. CTA HEAD Anterior circulation: No significant cavernous ICA disease. LEFT  anterior circulation normal. On the RIGHT, there is an early bifurcation of the M1 MCA. There is severe stenosis of the superior M2 segment corresponding to the areas of acute infarction. See image 228 series 402. The inferior M2 segment demonstrates mild stenosis at its origin, probably not flow reducing. No aneurysm, or vascular malformation. Posterior circulation: LEFT vertebral dominant. RIGHT vertebral has a diminutive connection to the basilar, but primarily supplies the RIGHT PICA. No significant stenosis, proximal occlusion, aneurysm, or vascular malformation. Venous sinuses: As permitted by contrast timing, patent. Anatomic variants: None of significance. Delayed phase:   No abnormal intracranial enhancement. IMPRESSION: Non stenotic extracranial carotid bifurcation disease, consisting of calcific plaque. Severe stenosis of the RIGHT MCA M2 superior division vessel, corresponding with the observed areas of infarction. Developing nonhemorrhagic cytotoxic edema, RIGHT hemisphere, without significant mass effect. Electronically Signed   By: Staci Righter M.D.   On: 03/11/2016 09:29   Ct Cervical Spine Wo Contrast  03/10/2016  CLINICAL DATA:  Fall, left-sided weakness. EXAM: CT HEAD WITHOUT CONTRAST CT CERVICAL SPINE WITHOUT CONTRAST TECHNIQUE: Multidetector CT imaging of the head and cervical spine was performed following the standard protocol without intravenous contrast. Multiplanar CT image reconstructions of the cervical spine were also generated. COMPARISON:  MRI brain 06/10/2006 FINDINGS: CT HEAD FINDINGS There is low-density throughout the insular cortex and adjacent temporal lobe and frontal lobe on the right compatible with acute infarct. No hemorrhage. No hydrocephalus or midline shift. No acute calvarial abnormality. CT CERVICAL SPINE FINDINGS Prior anterior fusion from C5-C7. Degenerative changes at see C4-5 and C7-T1. No fracture. No malalignment. Prevertebral soft tissues are normal. No  epidural or paraspinal hematoma. IMPRESSION: Acute right MCA infarct involving the insular cortex and adjacent right frontal and temporal lobes. No hemorrhage. No acute bony abnormality in the cervical spine. Critical Value/emergent results were called by telephone at the time of interpretation on 03/10/2016 at 2:04 pm to Dr. Theotis Burrow , who verbally acknowledged these results. Electronically Signed   By: Rolm Baptise M.D.   On: 03/10/2016 14:04   Mr Brain Wo Contrast  03/10/2016  CLINICAL DATA:  Left-sided weakness for at least 2 days. Stroke risk factors include diabetes, hypertension, dyslipidemia, and previous stroke. EXAM: MRI HEAD WITHOUT CONTRAST TECHNIQUE: Multiplanar, multiecho pulse sequences of the brain and surrounding structures were obtained without intravenous contrast. COMPARISON:  CT head performed earlier today. FINDINGS: Extensive areas of both focal and confluent restricted diffusion throughout the RIGHT hemisphere involving the frontal, temporal, insular, and parietal regions, largely RIGHT MCA territory, consistent with acute infarction. No visible hemorrhage, mass lesion, hydrocephalus, or extra-axial fluid. Premature for age cerebral and cerebellar atrophy. Mild subcortical and periventricular T2 and FLAIR hyperintensities, likely chronic microvascular ischemic change. Remote LEFT insular cortex infarct. Old LEFT cerebellar infarct. No chronic hemorrhage. Flow voids are maintained in the internal carotid arteries, basilar artery, and LEFT vertebral artery. RIGHT vertebral is diminutive. There is poor flow related enhancement in the RIGHT M1 MCA, representing either long segment stenosis or occlusion. No significant midline shift. Extracranial soft tissues unremarkable.  No midline abnormality. IMPRESSION: Extensive areas of both focal and confluent restricted diffusion throughout the RIGHT MCA distribution representing acute nonhemorrhagic infarction. Suspected RIGHT M1 MCA long segment  stenosis or occlusion. Atrophy with small vessel disease. Areas of chronic infarction, most notable LEFT insula and LEFT  cerebellum. Electronically Signed   By: Staci Righter M.D.   On: 03/10/2016 19:25    Assessment/Plan: Diagnosis: Right MCA territory infarct Labs and images independently reviewed.  Records reviewed and summated above. Stroke: Continue secondary stroke prophylaxis and Risk Factor Modification listed below:   Antiplatelet therapy:   Blood Pressure Management:  Continue current medication with prn's with permisive HTN per primary team Statin Agent:   Diabetes management:   Left sided hemiparesis: fit for orthosis to prevent contractures (resting hand splint for day, wrist cock up splint at night, PRAFO, etc) Motor recovery: Fluoxetine  1. Does the need for close, 24 hr/day medical supervision in concert with the patient's rehab needs make it unreasonable for this patient to be served in a less intensive setting? Yes  2. Co-Morbidities requiring supervision/potential complications: HTN (monitor and provide prns in accordance with increased physical exertion and pain), obesity (encourage weight loss), hyperlipidemia (cont meds), left rotator cuff repair, tachypnea (monitor RR and O2 Sats with increased physical exertion), AKI (avoid nephrotoxic meds), hypothyroidism (cont meds, consider adjustment due to elevated TSH), obesity hypoventilation syndrome (cont CPAP, monitor for daytime somnolence) 3. Due to safety, skin/wound care, disease management, medication administration and patient education, does the patient require 24 hr/day rehab nursing? Yes 4. Does the patient require coordinated care of a physician, rehab nurse, PT (1-2 hrs/day, 5 days/week), OT (1-2 hrs/day, 5 days/week) and SLP (1-2 hrs/day, 5 days/week) to address physical and functional deficits in the context of the above medical diagnosis(es)? Yes Addressing deficits in the following areas: balance, endurance,  locomotion, strength, transferring, bathing, dressing, toileting, speech and psychosocial support 5. Can the patient actively participate in an intensive therapy program of at least 3 hrs of therapy per day at least 5 days per week? Yes 6. The potential for patient to make measurable gains while on inpatient rehab is excellent 7. Anticipated functional outcomes upon discharge from inpatient rehab are supervision  with PT, supervision and min assist with OT, modified independent with SLP. 8. Estimated rehab length of stay to reach the above functional goals is: 16-19 days. 9. Does the patient have adequate social supports and living environment to accommodate these discharge functional goals? Potentially 10. Anticipated D/C setting: Home 11. Anticipated post D/C treatments: HH therapy and Home excercise program 12. Overall Rehab/Functional Prognosis: good  RECOMMENDATIONS: This patient's condition is appropriate for continued rehabilitative care in the following setting: CIR   Patient has agreed to participate in recommended program. Yes Note that insurance prior authorization may be required for reimbursement for recommended care.  Comment: Rehab Admissions Coordinator to follow up.  Delice Lesch, MD 03/11/2016

## 2016-03-11 NOTE — Progress Notes (Addendum)
PROGRESS NOTE  Danielle Vargas  [id number] DOB: [birth year]  DOA: 03/10/2016 PCP: Marton Redwood, MD   Brief Narrative:  65 year old female, lives alone, PMH of DM, HLD, HTN, hypothyroid, CAD, OSA on CPAP, sustained 2 falls from her bed on 2 consecutive days Friday and Saturday and laid on the floor for several hours. On the first occasion, she felt that because of left shoulder weakness from prior surgery, she was unable to get up-was evaluated by EMS but refused to come to the ED. However after the second episode, she realized she was weak on the left side which was worse and presented to ED. Diagnosed with acute stroke. Neurology following.   Assessment & Plan:   Principal Problem:   Ischemic stroke ([place]) Active Problems:   LADA (latent autoimmune diabetes in adults), managed as type 1 ([place])   HTN (hypertension)   Chronic diastolic heart failure (HCC)   CAD in native artery   Morbid obesity ([place])   Retinopathy due to secondary diabetes mellitus, without macular edema, with moderate nonproliferative retinopathy (HCC)   Neuropathy due to secondary diabetes mellitus (HCC)   Obesity hypoventilation syndrome (HCC)   OSA on CPAP   Hypothyroidism   Stroke (cerebrum) (HCC)   Acute right brain stroke (nondominant right MCA territory infarct embolic secondary to unknown source) - Resultant left facial droop and left hemiparesis-better. - CT head 7/3: Acute right MCA infarct involving the insular cortex and adjacent right frontal and temporal lobes. No hemorrhage. - CT cervical spine 7/3: No acute bony abnormality of the cervical spine. - MRI head 03/10/16: Right MCA territory acute nonhemorrhagic infarction. Suspected right M1 MCA long segment stenosis or occlusion. - CTA head and neck 7/4: Severe stenosis of right MCA M2 superior division vessel, corresponding with the observed areas of infarction. Developing nonhemorrhagic cytotoxic edema, right hemisphere, without significant mass effect.  Nonstenotic extracranial carotid bifurcation disease. - 2-D echo 03/11/16: Very limited study. The left ventricle appears normal in overall size and function - Neurology consultation appreciated and have requested TEE to look for embolic source. Lower extremity venous Dopplers also pending. - LDL 63 - A1c 7.3 - Was on aspirin 81 MG daily prior to admission, changed to clopidogrel 75 MG daily. - Therapies recommend CIR-consulted  Essential hypertension - Allow for permissive hypertension. Continue amlodipine, carvedilol, losartan. May need further adjustment.  Hyperlipidemia - LDL 63, goal <70. Continue Crestor and fenofibrate.  Type II DM - A1c 7.1, goal <7. Almost at goal.  - Patient follows with Dr. Benjiman Core, Endocrinologist. She is on high doses of insulin's at home and states that she takes Lantus 45 units twice a day and Humulin R 45 units 3 times daily before meals. - Initially was started on Lantus 22 units subcutaneously twice a day and NovoLog sensitive SSI without bedtime coverage. - CBGs currently ranging from 175-313. - Increased Lantus to 35 units twice daily, change SSI to moderate with night coverage and added mealtime NovoLog 10 units 3 times a day. CBGs to be closely monitored and insulin's rapidly titrated up as needed. - Discussed at length with patient's RN.  OSA on CPAP - Continue.  Obesity/There is no weight on file to calculate BMI. - Needs diet, exercise and weight loss.  Hypothyroid  Left-sided mid back pain/undisplaced ninth rib fracture - Supportive treatment. Discussed with patient and updated.  Elevated troponin - Mildly elevated troponin without chest pain and flat trend.? Related to acute stroke. Continue Plavix, statins, beta blockers. Could also be related  to mild rhabdomyolysis from fall.  Gram-negative rod UTI - Continue IV ceftriaxone.  Chronic back pain - Continue home medications.   DVT prophylaxis: SCDs Code Status:  Full Family Communication: Discussed with patient's daughter at bedside. Disposition Plan: CIR evaluating.   Consultants:   Neurology  Rehabilitation M.D.  Procedures:   2-D echo  Antimicrobials:   IV Rocephin    Subjective: Complains of left mid back pain. No dyspnea reported. Left-sided weakness is about the same.  Objective:  Filed Vitals:   03/11/16 0603 03/11/16 1013 03/11/16 1200 03/11/16 1407  BP: 186/75 161/63 203/63 155/97  Pulse: 80 88 88 87  Temp: 98 F (36.7 C) 99 F (37.2 C)  98.7 F (37.1 C)  TempSrc: Oral Oral  Oral  Resp: 16 18  18   SpO2: 98% 100% 96% 97%   No intake or output data in the 24 hours ending 03/11/16 1743 There were no vitals filed for this visit.  Examination:  General exam: Pleasant middle-aged female sitting up comfortably in chair this morning. Daughter at bedside. Respiratory system: Clear to auscultation. Respiratory effort normal. Cardiovascular system: S1 & S2 heard, RRR. No JVD, murmurs, rubs, gallops or clicks. No pedal edema. Telemetry: Sinus rhythm. Gastrointestinal system: Abdomen is nondistended, soft and nontender. No organomegaly or masses felt. Normal bowel sounds heard. Central nervous system: Alert and oriented. Mild facial asymmetry but no dysarthria. Extremities: Left limbs grade 4+ by 5 power in upper extremity with drift. All other limbs grade 5 x 5 power. Skin: No rashes, lesions or ulcers Psychiatry: Judgement and insight appear normal. Mood & affect appropriate.  Musculoskeletal: Mild tenderness left mid back without external evidence of injury. No deformity appreciated.    Data Reviewed: I have personally reviewed following labs and imaging studies  CBC:  Recent Labs Lab 03/10/16 1356 03/11/16 0507  WBC 9.7 7.2  NEUTROABS 6.7  --   HGB 12.1 12.2  HCT 38.5 39.0  MCV 87.7 89.0  PLT 293 Q000111Q   Basic Metabolic Panel:  Recent Labs Lab 03/10/16 1356 03/11/16 0507  NA 137 139  K 3.8 3.8  CL  109 110  CO2 20* 21*  GLUCOSE 266* 203*  BUN 24* 20  CREATININE 1.14* 1.07*  CALCIUM 9.5 9.2   GFR: Estimated Creatinine Clearance: 58.4 mL/min (by C-G formula based on Cr of 1.07). Liver Function Tests:  Recent Labs Lab 03/10/16 1356  AST 38  ALT 30  ALKPHOS 40  BILITOT 0.8  PROT 6.6  ALBUMIN 3.6   No results for input(s): LIPASE, AMYLASE in the last 168 hours. No results for input(s): AMMONIA in the last 168 hours. Coagulation Profile:  Recent Labs Lab 03/10/16 1648  INR 1.21   Cardiac Enzymes:  Recent Labs Lab 03/10/16 1356 03/10/16 1648 03/10/16 2310 03/11/16 0507  CKTOTAL 357*  --   --   --   TROPONINI  --  0.10* 0.09* 0.08*   BNP (last 3 results) No results for input(s): PROBNP in the last 8760 hours. HbA1C: No results for input(s): HGBA1C in the last 72 hours. CBG:  Recent Labs Lab 03/10/16 1749 03/10/16 2224 03/11/16 0636 03/11/16 1131  GLUCAP 175* 313* 194* 258*   Lipid Profile:  Recent Labs  03/11/16 0507  CHOL 138  HDL 29*  LDLCALC 63  TRIG 232*  CHOLHDL 4.8   Thyroid Function Tests: No results for input(s): TSH, T4TOTAL, FREET4, T3FREE, THYROIDAB in the last 72 hours. Anemia Panel: No results for input(s): VITAMINB12, FOLATE, FERRITIN, TIBC,  IRON, RETICCTPCT in the last 72 hours.  Sepsis Labs: No results for input(s): PROCALCITON, LATICACIDVEN in the last 168 hours.  Recent Results (from the past 240 hour(s))  Culture, Urine     Status: Abnormal (Preliminary result)   Collection Time: 03/10/16  3:00 PM  Result Value Ref Range Status   Specimen Description URINE, CATHETERIZED  Final   Special Requests NONE  Final   Culture >=100,000 COLONIES/mL GRAM NEGATIVE RODS (A)  Final   Report Status PENDING  Incomplete         Radiology Studies: Ct Angio Head W Or Wo Contrast  03/11/2016  CLINICAL DATA:  Continued surveillance RIGHT hemisphere infarction. LEFT hemiparesis. EXAM: CT ANGIOGRAPHY HEAD AND NECK TECHNIQUE:  Multidetector CT imaging of the head and neck was performed using the standard protocol during bolus administration of intravenous contrast. Multiplanar CT image reconstructions and MIPs were obtained to evaluate the vascular anatomy. Carotid stenosis measurements (when applicable) are obtained utilizing NASCET criteria, using the distal internal carotid diameter as the denominator. CONTRAST:  Isovue 370, 50 mL. COMPARISON:  MRI brain 03/10/2016.  Noncontrast CT head 03/10/2016. FINDINGS: CT HEAD Calvarium and skull base: No fracture or destructive lesion. Mastoids and middle ears are grossly clear. Paranasal sinuses: Imaged portions are clear. Orbits: Negative. Brain: No hemorrhage is seen. Cytotoxic edema is now developing in the areas of restricted diffusion noted on MR. No hemorrhagic transformation. No midline shift. Chronic atrophic change, stable. White matter hypoattenuation favored to represent small vessel disease. CTA NECK Aortic arch: Standard branching. Imaged portion shows no evidence of aneurysm or dissection. No significant stenosis of the major arch vessel origins. Right carotid system: Calcific plaque at the bifurcation extending into the RIGHT ICA. No evidence of dissection, stenosis (50% or greater) or occlusion. Left carotid system: Calcific plaque at the bifurcation, extending into the LEFT ICA No evidence of dissection, stenosis (50% or greater) or occlusion. Vertebral arteries: LEFT vertebral dominant. RIGHT vertebral diminutive no stenosis in the neck. No evidence of dissection, stenosis (50% or greater) or occlusion. Nonvascular soft tissues: No masses. Spondylosis. No lung apex lesion. Mediastinal lipomatosis reflecting increased body habitus. Previous cervical fusion Q000111Q appears uncomplicated. CTA HEAD Anterior circulation: No significant cavernous ICA disease. LEFT anterior circulation normal. On the RIGHT, there is an early bifurcation of the M1 MCA. There is severe stenosis of the  superior M2 segment corresponding to the areas of acute infarction. See image 228 series 402. The inferior M2 segment demonstrates mild stenosis at its origin, probably not flow reducing. No aneurysm, or vascular malformation. Posterior circulation: LEFT vertebral dominant. RIGHT vertebral has a diminutive connection to the basilar, but primarily supplies the RIGHT PICA. No significant stenosis, proximal occlusion, aneurysm, or vascular malformation. Venous sinuses: As permitted by contrast timing, patent. Anatomic variants: None of significance. Delayed phase:   No abnormal intracranial enhancement. IMPRESSION: Non stenotic extracranial carotid bifurcation disease, consisting of calcific plaque. Severe stenosis of the RIGHT MCA M2 superior division vessel, corresponding with the observed areas of infarction. Developing nonhemorrhagic cytotoxic edema, RIGHT hemisphere, without significant mass effect. Electronically Signed   By: Staci Righter M.D.   On: 03/11/2016 09:29   Dg Chest 2 View  03/10/2016  CLINICAL DATA:  Left lower chest pain beginning after landing on left side for 6 hours. Weakness. EXAM: CHEST  2 VIEW COMPARISON:  11/10/2015 FINDINGS: Lungs are adequately inflated without focal consolidation or effusion. Stable cardiomegaly. There are degenerative changes of the spine. Partially visualized fusion hardware over the  cervical spine intact. Surgical change of the left AC joint. Prominent overlying soft tissues. IMPRESSION: No active cardiopulmonary disease. Stable cardiomegaly. Electronically Signed   By: Marin Olp M.D.   On: 03/10/2016 17:25   Dg Ribs Unilateral Left  03/11/2016  CLINICAL DATA:  Fall.  Left side rib pain and back pain. EXAM: LEFT RIBS - 2 VIEW COMPARISON:  11/10/2015 FINDINGS: Acute left anterior ninth rib fracture deformity identified. No additional fractures or subluxations. IMPRESSION: 1. Left ninth rib fracture. Electronically Signed   By: Kerby Moors M.D.   On: 03/11/2016  12:54   Dg Thoracic Spine W/swimmers  03/11/2016  CLINICAL DATA:  65 year old female with a history of fall and rib pain EXAM: THORACIC SPINE - 3 VIEWS COMPARISON:  None. FINDINGS: Thoracic Spine: Thoracic vertebral elements maintain normal anatomic alignment, with no evidence of anterolisthesis, retrolisthesis, or subluxation. No acute fracture line identified. Vertebral body heights maintained. Mild degenerative disc disease. Unremarkable appearance of the visualized thorax. Surgical changes of prior anterior cervical discectomy infusion of the lower cervical region. IMPRESSION: No radiographic evidence of acute fracture or malalignment of thoracic spine. No displaced rib fracture identified on this plain film series. Signed, Dulcy Fanny. Earleen Newport, DO [facility] Electronically Signed   By: Corrie Mckusick D.O.   On: 03/11/2016 12:58   Ct Head Wo Contrast  03/10/2016  CLINICAL DATA:  Fall, left-sided weakness. EXAM: CT HEAD WITHOUT CONTRAST CT CERVICAL SPINE WITHOUT CONTRAST TECHNIQUE: Multidetector CT imaging of the head and cervical spine was performed following the standard protocol without intravenous contrast. Multiplanar CT image reconstructions of the cervical spine were also generated. COMPARISON:  MRI brain 06/10/2006 FINDINGS: CT HEAD FINDINGS There is low-density throughout the insular cortex and adjacent temporal lobe and frontal lobe on the right compatible with acute infarct. No hemorrhage. No hydrocephalus or midline shift. No acute calvarial abnormality. CT CERVICAL SPINE FINDINGS Prior anterior fusion from C5-C7. Degenerative changes at see C4-5 and C7-T1. No fracture. No malalignment. Prevertebral soft tissues are normal. No epidural or paraspinal hematoma. IMPRESSION: Acute right MCA infarct involving the insular cortex and adjacent right frontal and temporal lobes. No hemorrhage. No acute bony abnormality in the cervical spine. Critical  Value/emergent results were called by telephone at the time of interpretation on 03/10/2016 at 2:04 pm to Dr. Theotis Burrow , who verbally acknowledged these results. Electronically Signed   By: Rolm Baptise M.D.   On: 03/10/2016 14:04   Ct Angio Neck W Or Wo Contrast  03/11/2016  CLINICAL DATA:  Continued surveillance RIGHT hemisphere infarction. LEFT hemiparesis. EXAM: CT ANGIOGRAPHY HEAD AND NECK TECHNIQUE: Multidetector CT imaging of the head and neck was performed using the standard protocol during bolus administration of intravenous contrast. Multiplanar CT image reconstructions and MIPs were obtained to evaluate the vascular anatomy. Carotid stenosis measurements (when applicable) are obtained utilizing NASCET criteria, using the distal internal carotid diameter as the denominator. CONTRAST:  Isovue 370, 50 mL. COMPARISON:  MRI brain 03/10/2016.  Noncontrast CT head 03/10/2016. FINDINGS: CT HEAD Calvarium and skull base: No fracture or destructive lesion. Mastoids and middle ears are grossly clear. Paranasal sinuses: Imaged portions are clear. Orbits: Negative. Brain: No hemorrhage is seen. Cytotoxic edema is now developing in the areas of restricted diffusion noted on MR. No hemorrhagic transformation. No midline shift. Chronic atrophic change, stable. White matter hypoattenuation favored to represent small vessel disease. CTA NECK Aortic arch: Standard branching. Imaged portion shows no evidence of aneurysm or dissection.  No significant stenosis of the major arch vessel origins. Right carotid system: Calcific plaque at the bifurcation extending into the RIGHT ICA. No evidence of dissection, stenosis (50% or greater) or occlusion. Left carotid system: Calcific plaque at the bifurcation, extending into the LEFT ICA No evidence of dissection, stenosis (50% or greater) or occlusion. Vertebral arteries: LEFT vertebral dominant. RIGHT vertebral diminutive no stenosis in the neck. No evidence of dissection,  stenosis (50% or greater) or occlusion. Nonvascular soft tissues: No masses. Spondylosis. No lung apex lesion. Mediastinal lipomatosis reflecting increased body habitus. Previous cervical fusion Q000111Q appears uncomplicated. CTA HEAD Anterior circulation: No significant cavernous ICA disease. LEFT anterior circulation normal. On the RIGHT, there is an early bifurcation of the M1 MCA. There is severe stenosis of the superior M2 segment corresponding to the areas of acute infarction. See image 228 series 402. The inferior M2 segment demonstrates mild stenosis at its origin, probably not flow reducing. No aneurysm, or vascular malformation. Posterior circulation: LEFT vertebral dominant. RIGHT vertebral has a diminutive connection to the basilar, but primarily supplies the RIGHT PICA. No significant stenosis, proximal occlusion, aneurysm, or vascular malformation. Venous sinuses: As permitted by contrast timing, patent. Anatomic variants: None of significance. Delayed phase:   No abnormal intracranial enhancement. IMPRESSION: Non stenotic extracranial carotid bifurcation disease, consisting of calcific plaque. Severe stenosis of the RIGHT MCA M2 superior division vessel, corresponding with the observed areas of infarction. Developing nonhemorrhagic cytotoxic edema, RIGHT hemisphere, without significant mass effect. Electronically Signed   By: Staci Righter M.D.   On: 03/11/2016 09:29   Ct Cervical Spine Wo Contrast  03/10/2016  CLINICAL DATA:  Fall, left-sided weakness. EXAM: CT HEAD WITHOUT CONTRAST CT CERVICAL SPINE WITHOUT CONTRAST TECHNIQUE: Multidetector CT imaging of the head and cervical spine was performed following the standard protocol without intravenous contrast. Multiplanar CT image reconstructions of the cervical spine were also generated. COMPARISON:  MRI brain 06/10/2006 FINDINGS: CT HEAD FINDINGS There is low-density throughout the insular cortex and adjacent temporal lobe and frontal lobe on the  right compatible with acute infarct. No hemorrhage. No hydrocephalus or midline shift. No acute calvarial abnormality. CT CERVICAL SPINE FINDINGS Prior anterior fusion from C5-C7. Degenerative changes at see C4-5 and C7-T1. No fracture. No malalignment. Prevertebral soft tissues are normal. No epidural or paraspinal hematoma. IMPRESSION: Acute right MCA infarct involving the insular cortex and adjacent right frontal and temporal lobes. No hemorrhage. No acute bony abnormality in the cervical spine. Critical Value/emergent results were called by telephone at the time of interpretation on 03/10/2016 at 2:04 pm to Dr. Theotis Burrow , who verbally acknowledged these results. Electronically Signed   By: Rolm Baptise M.D.   On: 03/10/2016 14:04   Mr Brain Wo Contrast  03/10/2016  CLINICAL DATA:  Left-sided weakness for at least 2 days. Stroke risk factors include diabetes, hypertension, dyslipidemia, and previous stroke. EXAM: MRI HEAD WITHOUT CONTRAST TECHNIQUE: Multiplanar, multiecho pulse sequences of the brain and surrounding structures were obtained without intravenous contrast. COMPARISON:  CT head performed earlier today. FINDINGS: Extensive areas of both focal and confluent restricted diffusion throughout the RIGHT hemisphere involving the frontal, temporal, insular, and parietal regions, largely RIGHT MCA territory, consistent with acute infarction. No visible hemorrhage, mass lesion, hydrocephalus, or extra-axial fluid. Premature for age cerebral and cerebellar atrophy. Mild subcortical and periventricular T2 and FLAIR hyperintensities, likely chronic microvascular ischemic change. Remote LEFT insular cortex infarct. Old LEFT cerebellar infarct. No chronic hemorrhage. Flow voids are maintained in the internal carotid arteries,  basilar artery, and LEFT vertebral artery. RIGHT vertebral is diminutive. There is poor flow related enhancement in the RIGHT M1 MCA, representing either long segment stenosis or  occlusion. No significant midline shift. Extracranial soft tissues unremarkable.  No midline abnormality. IMPRESSION: Extensive areas of both focal and confluent restricted diffusion throughout the RIGHT MCA distribution representing acute nonhemorrhagic infarction. Suspected RIGHT M1 MCA long segment stenosis or occlusion. Atrophy with small vessel disease. Areas of chronic infarction, most notable LEFT insula and LEFT cerebellum. Electronically Signed   By: Staci Righter M.D.   On: 03/10/2016 19:25        Scheduled Meds: . amLODipine  5 mg Oral Daily  . carvedilol  12.5 mg Oral BID WC  . cefTRIAXone (ROCEPHIN)  IV  1 g Intravenous Q24H  . clopidogrel  75 mg Oral Daily  . fenofibrate  160 mg Oral QHS  . gabapentin  300 mg Oral TID  . insulin aspart  0-9 Units Subcutaneous TID WC  . insulin glargine  22 Units Subcutaneous BID  . levothyroxine  75 mcg Oral QAC breakfast  . losartan  50 mg Oral BID  . metoCLOPramide  10 mg Oral QID  . mometasone-formoterol  2 puff Inhalation BID  . montelukast  10 mg Oral QHS  . pantoprazole  40 mg Oral Daily  . polyethylene glycol  17 g Oral QODAY  . rosuvastatin  20 mg Oral Daily  . topiramate  50 mg Oral BID   Continuous Infusions:    LOS: 1 day    Time spent: 40 minutes.    [facility], MD [facility] Pager [phone]  If 7PM-7AM, please contact night-coverage [email or web address] Password [facility] 03/11/2016, 5:43 PM

## 2016-03-11 NOTE — Evaluation (Signed)
Occupational Therapy Evaluation Patient Details Name: Danielle Vargas MRN: [id number] DOB: 08-12-1951 Today's Date: 03/11/2016    History of Present Illness 65 yo female admitted s/p fall with R MCA PMH: obese L rotator cuff surg DM HTN migraine diverticulosis asthma cataracts MI depression   Clinical Impression   PT admitted with RMCA. Pt currently with functional limitiations due to the deficits listed below (see OT problem list). PTA was independent with all adls and wants to return to PLOF by her birthday.  Pt will benefit from skilled OT to increase their independence and safety with adls and balance to allow discharge CIR. Pt could benefit from CIR for 7-10 days to reach MOD I and return to apartment living.      Follow Up Recommendations  CIR    Equipment Recommendations  3 in 1 bedside comode;Other (comment) (RW)    Recommendations for Other Services Rehab consult     Precautions / Restrictions Precautions Precautions: None      Mobility Bed Mobility               General bed mobility comments: in chair on arrival  Transfers Overall transfer level: Needs assistance Equipment used: 1 person hand held assist Transfers: Sit to/from Stand Sit to Stand: Min assist         General transfer comment: requires use of bil Ue to push up into standing    Balance                                            ADL Overall ADL's : Needs assistance/impaired Eating/Feeding: Set up;Sitting Eating/Feeding Details (indicate cue type and reason): daugther present and cutting up food for patient. Pt able to use R hand to self feed Grooming: Wash/dry hands;Set up;Sitting Grooming Details (indicate cue type and reason): incr effort for L Ue ot meet R Ue                 Toilet Transfer: Minimal assistance;Ambulation;BSC Toilet Transfer Details (indicate cue type and reason): Pt reaching for environmental supports. pt could benefit from trial of  RW Toileting- Water quality scientist and Hygiene: Min guard;Sitting/lateral lean       Functional mobility during ADLs: Minimal assistance General ADL Comments: Pt provided hand held (A) to ambulate to bathroom. pt with more of a noticeable limp to gait exiting bathroom and pt reports fatigue. Pt could benefit from trial of RW. Pt will have children (A) upon d/c home     Vision Vision Assessment?: No apparent visual deficits;Yes Eye Alignment: Within Functional Limits Ocular Range of Motion: Within Functional Limits Alignment/Gaze Preference: Within Defined Limits Tracking/Visual Pursuits: Able to track stimulus in all quads without difficulty Saccades: Within functional limits Convergence: Within functional limits   Perception     Praxis      Pertinent Vitals/Pain Pain Assessment: No/denies pain     Hand Dominance Right   Extremity/Trunk Assessment Upper Extremity Assessment Upper Extremity Assessment: LUE deficits/detail LUE Deficits / Details: decr sensation but reports 90% normal compared to R UE. Pt with ataxic movement Pt dropping items withoiut awareness of decr grasp . pt states It just lets go   Lower Extremity Assessment Lower Extremity Assessment: Defer to PT evaluation   Cervical / Trunk Assessment Cervical / Trunk Assessment: Other exceptions (hx of SI joint deficits)   Communication Communication Communication: No difficulties   Cognition Arousal/Alertness:  Awake/alert Behavior During Therapy: WFL for tasks assessed/performed Overall Cognitive Status: Within Functional Limits for tasks assessed                     General Comments       Exercises Exercises: Other exercises Other Exercises Other Exercises: provided fine motor exercises and blue theraball with handout. Pt return demonstrate exercises   Shoulder Instructions      Home Living Family/patient expects to be discharged to:: Private residence Living Arrangements: Alone   Type of  Home: Apartment Home Access: Level entry     Home Layout: One level     Bathroom Shower/Tub: Occupational psychologist: Handicapped height     Home Equipment: None   Additional Comments: drives , play a game called "hay day"       Prior Functioning/Environment Level of Independence: Independent             OT Diagnosis: Generalized weakness   OT Problem List: Decreased strength;Decreased activity tolerance;Impaired balance (sitting and/or standing);Decreased cognition;Decreased safety awareness;Decreased knowledge of use of DME or AE;Decreased knowledge of precautions;Obesity;Impaired sensation;Impaired UE functional use   OT Treatment/Interventions: Self-care/ADL training;Therapeutic exercise;Neuromuscular education;DME and/or AE instruction;Therapeutic activities;Patient/family education;Balance training    OT Goals(Current goals can be found in the care plan section) Acute Rehab OT Goals Patient Stated Goal: to get back to normal by my birthday [birth year] OT Goal Formulation: With patient Time For Goal Achievement: 03/25/16 Potential to Achieve Goals: Good  OT Frequency: Min 3X/week   Barriers to D/C:            Co-evaluation              End of Session Equipment Utilized During Treatment: Gait belt Nurse Communication: Mobility status;Precautions  Activity Tolerance: Patient tolerated treatment well Patient left: in chair;with call bell/phone within reach;with bed alarm set;with family/visitor present   Time: [id number] OT Time Calculation (min): 28 min Charges:  OT General Charges $OT Visit: 1 Procedure OT Evaluation $OT Eval Moderate Complexity: 1 Procedure OT Treatments $Self Care/Home Management : 8-22 mins G-Codes:    Parke Poisson B 03/24/2016, 10:06 AM   Jeri Modena   OTR/L [phone] Office: [phone] .

## 2016-03-11 NOTE — Progress Notes (Addendum)
Echocardiogram 2D Echocardiogram was attempted. 10 minutes into the echocardiogram the patient refused to continue with the procedure.  Joelene Millin 03/11/2016, 11:18 AM

## 2016-03-11 NOTE — Evaluation (Signed)
Physical Therapy Evaluation Patient Details Name: Danielle Vargas MRN: [id number] DOB: [birth year] Today's Date: 03/11/2016   History of Present Illness  65 yo female admitted s/p fall with R MCA PMH: obese L rotator cuff surg DM HTN migraine diverticulosis asthma cataracts MI depression  Clinical Impression  Pt admitted with above. Pt with noted L sided weakness requiring assist for all ADLs and transfers. Pt indep PTA. Pt to strongly benefit from CIR to achieve safe mod I level of function. Pt motivated to return home alone and suspect pt can achieve safe mod I in 7-10 days. Pt with extremely supportive family. Acute PT to follow.    Follow Up Recommendations CIR    Equipment Recommendations  Other (comment) (TBD at next venue)    Recommendations for Other Services Rehab consult     Precautions / Restrictions Precautions Precautions: Fall Restrictions Weight Bearing Restrictions: No      Mobility  Bed Mobility Overal bed mobility: Needs Assistance Bed Mobility: Rolling;Sidelying to Sit Rolling: Modified independent (Device/Increase time) Sidelying to sit: Mod assist       General bed mobility comments: edu on splinting L ribs due to pain, assist to achieve roll, assist for trunk elevation  Transfers Overall transfer level: Needs assistance Equipment used: Rolling walker (2 wheeled) Transfers: Sit to/from Stand Sit to Stand: Min assist         General transfer comment: requires use of B UE to get push into standing   Ambulation/Gait Ambulation/Gait assistance: Min assist Ambulation Distance (Feet): 60 Feet (x1, 50 ft x1) Assistive device: Rolling walker (2 wheeled) Gait Pattern/deviations: Step-through pattern;Decreased stride length Gait velocity: decreased gait velocity Gait velocity interpretation: Below normal speed for age/gender General Gait Details: Required max verbal cues to maintain L hand grip on walker. Required seated rest break of 3 min, +SOB.Marland Kitchen with  onset of fatique, L LE instability.  Stairs            Wheelchair Mobility    Modified Rankin (Stroke Patients Only) Modified Rankin (Stroke Patients Only) Pre-Morbid Rankin Score: No symptoms Modified Rankin: Moderately severe disability     Balance Overall balance assessment: Needs assistance Sitting-balance support: No upper extremity supported;Feet unsupported Sitting balance-Leahy Scale: Fair     Standing balance support: Bilateral upper extremity supported Standing balance-Leahy Scale: Poor Standing balance comment: Require support of L UE to perform hygiene after using the toilet.                             Pertinent Vitals/Pain Pain Assessment: 0-10 Pain Score: 10-Worst pain ever Pain Location: back, L ribs Pain Intervention(s): Premedicated before session    Home Living Family/patient expects to be discharged to:: Private residence Living Arrangements: Alone   Type of Home: Apartment Home Access: Level entry     Home Layout: One level Home Equipment: None Additional Comments: able to drive    Prior Function Level of Independence: Independent               Hand Dominance   Dominant Hand: Right    Extremity/Trunk Assessment   Upper Extremity Assessment: LUE deficits/detail       LUE Deficits / Details: decreased ability to maintain L grip on RW    Lower Extremity Assessment: LLE deficits/detail   LLE Deficits / Details: generalized weakness  Cervical / Trunk Assessment:  (pain from fall in back and L flank)  Communication   Communication: No difficulties  Cognition Arousal/Alertness:  Awake/alert Behavior During Therapy: WFL for tasks assessed/performed Overall Cognitive Status: Within Functional Limits for tasks assessed (tangental spech)                      General Comments General comments (skin integrity, edema, etc.): Pt taken to toilet with min guard for toileting.    Exercises         Assessment/Plan    PT Assessment Patient needs continued PT services  PT Diagnosis Difficulty walking;Generalized weakness   PT Problem List Decreased strength;Decreased mobility;Decreased balance;Decreased activity tolerance;Decreased knowledge of use of DME;Decreased coordination  PT Treatment Interventions DME instruction;Gait training;Functional mobility training;Therapeutic activities;Therapeutic exercise;Balance training;Neuromuscular re-education;Patient/family education   PT Goals (Current goals can be found in the Care Plan section) Acute Rehab PT Goals Patient Stated Goal: to go home PT Goal Formulation: With patient Time For Goal Achievement: 03/18/16 Potential to Achieve Goals: Good    Frequency Min 4X/week   Barriers to discharge Decreased caregiver support Lives alone but has a very supportive family.    Co-evaluation               End of Session Equipment Utilized During Treatment: Gait belt Activity Tolerance: Patient limited by fatigue;Patient tolerated treatment well Patient left: in chair;with call bell/phone within reach;with family/visitor present Nurse Communication: Mobility status         Time: 1124-1208 PT Time Calculation (min) (ACUTE ONLY): 44 min   Charges:   PT Evaluation $PT Eval Moderate Complexity: 1 Procedure PT Treatments $Gait Training: 8-22 mins $Therapeutic Activity: 8-22 mins   PT G Codes:        Kingsley Callander 03/11/2016, 1:44 PM   Kittie Plater, PT, DPT Pager #: [phone] Office #: [phone]

## 2016-03-11 NOTE — Progress Notes (Signed)
Rehab Admissions Coordinator Note:  Patient was screened by Retta Diones for appropriateness for an Inpatient Acute Rehab Consult.  At this time, an inpatient rehab consult has been ordered and is pending.  An admissions coordinator will follow up once consult is completed.   Retta Diones 03/11/2016, 11:36 AM  I can be reached at [phone].

## 2016-03-11 NOTE — Progress Notes (Signed)
Patient's left side almost completely flaccid at this point. She is a max assist to get out of bed. Took 3 RN's to get her to the [facility]. Pt mostly incontinent. More alert at this time. Paged Respiratory to hook up her CPAP. Monitoring closely. Soul Deveney, Rande Brunt, RN

## 2016-03-11 NOTE — Care Management Note (Signed)
Case Management Note  Patient Details  Name: Danielle Vargas MRN: [id number] Date of Birth: 1951/03/03  Subjective/Objective:     Pt admitted with CVA. She is from home alone.                Action/Plan: MD requesting IR consult. CM following for discharge disposition.   Expected Discharge Date:                  Expected Discharge Plan:  [place]  In-House Referral:     Discharge planning Services     Post Acute Care Choice:    Choice offered to:     DME Arranged:    DME Agency:     HH Arranged:    [place] Agency:     Status of Service:  In process, will continue to follow  If discussed at Long Length of Stay Meetings, dates discussed:    Additional Comments:  Pollie Friar, RN 03/11/2016, 10:44 AM

## 2016-03-11 NOTE — Progress Notes (Addendum)
Shift event: Attending reported off to this NP at shift change-RN noticed some increased slurred speech and drowsiness, but pt had recently received Norco 10/325 and sugar was somewhat high. BP was 120s. Pt received all BP meds today.  Attending decreased pain meds and increased insulin and asked this NP to f/up.  NP d/c'd Zanaflex for tonight as well. This NP spoke to day RN right after 1900 hrs and asked her to tell night RN to call NP with an update in one hour. Danielle Cruz, RN, called this NP back after one hour and stated pt still had slurred speech and ? increased weakness with a NIH score of 8 which is increased over last one of 5. Extra Novolog ordered for high CBG and stat CT head obtained. CT head neg for further acute issues, but slightly more edema noted surrounding known infarct without mass effect. This NP paged neuro on call to look at CT and call this NP back. Awaiting call at this time.  Clance Boll, NP [facility] Update: Heard back from neuro immediately. Per Dr. Leonel Ramsay, likely just local edema and no change in treatment plan is warranted at this time.  KJKG, NP [facility]

## 2016-03-12 ENCOUNTER — Inpatient Hospital Stay (HOSPITAL_COMMUNITY): Payer: Medicare Other

## 2016-03-12 ENCOUNTER — Encounter (HOSPITAL_COMMUNITY): Payer: Self-pay

## 2016-03-12 ENCOUNTER — Encounter (HOSPITAL_COMMUNITY)
Admission: EM | Disposition: A | Discharge status: Rehabilitation Facility | Payer: Self-pay | Source: Home / Self Care | Attending: Internal Medicine

## 2016-03-12 DIAGNOSIS — I639 Cerebral infarction, unspecified: Secondary | ICD-10-CM

## 2016-03-12 DIAGNOSIS — N179 Acute kidney failure, unspecified: Secondary | ICD-10-CM | POA: Insufficient documentation

## 2016-03-12 DIAGNOSIS — R0682 Tachypnea, not elsewhere classified: Secondary | ICD-10-CM | POA: Insufficient documentation

## 2016-03-12 DIAGNOSIS — I10 Essential (primary) hypertension: Secondary | ICD-10-CM | POA: Insufficient documentation

## 2016-03-12 DIAGNOSIS — Z9889 Other specified postprocedural states: Secondary | ICD-10-CM | POA: Insufficient documentation

## 2016-03-12 DIAGNOSIS — E785 Hyperlipidemia, unspecified: Secondary | ICD-10-CM | POA: Insufficient documentation

## 2016-03-12 DIAGNOSIS — E139 Other specified diabetes mellitus without complications: Secondary | ICD-10-CM

## 2016-03-12 HISTORY — PX: EP IMPLANTABLE DEVICE: SHX172B

## 2016-03-12 HISTORY — PX: TEE WITHOUT CARDIOVERSION: SHX5443

## 2016-03-12 LAB — GLUCOSE, CAPILLARY
GLUCOSE-CAPILLARY: 259 mg/dL — AB (ref 65–99)
GLUCOSE-CAPILLARY: 219 mg/dL — AB (ref 65–99)
Glucose-Capillary: 146 mg/dL — ABNORMAL HIGH (ref 65–99)
Glucose-Capillary: 212 mg/dL — ABNORMAL HIGH (ref 65–99)
Glucose-Capillary: 212 mg/dL — ABNORMAL HIGH (ref 65–99)
Glucose-Capillary: 251 mg/dL — ABNORMAL HIGH (ref 65–99)
Glucose-Capillary: 262 mg/dL — ABNORMAL HIGH (ref 65–99)

## 2016-03-12 LAB — HEMOGLOBIN A1C
Hgb A1c MFr Bld: 7.3 % — ABNORMAL HIGH (ref 4.8–5.6)
Mean Plasma Glucose: 163 mg/dL

## 2016-03-12 SURGERY — LOOP RECORDER INSERTION
Anesthesia: LOCAL

## 2016-03-12 SURGERY — ECHOCARDIOGRAM, TRANSESOPHAGEAL
Anesthesia: Moderate Sedation

## 2016-03-12 MED ORDER — FENTANYL CITRATE (PF) 100 MCG/2ML IJ SOLN
INTRAMUSCULAR | Status: AC
  Filled 2016-03-12: qty 2

## 2016-03-12 MED ORDER — FENTANYL CITRATE (PF) 100 MCG/2ML IJ SOLN
INTRAMUSCULAR | Status: DC | PRN
  Administered 2016-03-12 (×2): 25 ug via INTRAVENOUS

## 2016-03-12 MED ORDER — SODIUM CHLORIDE 0.9 % IV SOLN: INTRAVENOUS | Status: DC

## 2016-03-12 MED ORDER — MIDAZOLAM HCL 5 MG/ML IJ SOLN
INTRAMUSCULAR | Status: AC
  Filled 2016-03-12: qty 2

## 2016-03-12 MED ORDER — CARVEDILOL 6.25 MG PO TABS
6.2500 mg | ORAL_TABLET | Freq: Two times a day (BID) | ORAL | Status: DC
  Administered 2016-03-12 – 2016-03-14 (×5): 6.25 mg via ORAL
  Filled 2016-03-12 (×5): qty 1

## 2016-03-12 MED ORDER — INSULIN GLARGINE 100 UNIT/ML ~~LOC~~ SOLN
40.0000 [IU] | Freq: Two times a day (BID) | SUBCUTANEOUS | Status: DC
  Administered 2016-03-12 – 2016-03-14 (×4): 40 [IU] via SUBCUTANEOUS
  Filled 2016-03-12 (×5): qty 0.4

## 2016-03-12 MED ORDER — LIDOCAINE-EPINEPHRINE 1 %-1:100000 IJ SOLN
INTRAMUSCULAR | Status: DC | PRN
  Administered 2016-03-12: 24 mL

## 2016-03-12 MED ORDER — MIDAZOLAM HCL 10 MG/2ML IJ SOLN
INTRAMUSCULAR | Status: DC | PRN
  Administered 2016-03-12 (×2): 2 mg via INTRAVENOUS

## 2016-03-12 MED ORDER — LIDOCAINE-EPINEPHRINE 1 %-1:100000 IJ SOLN
INTRAMUSCULAR | Status: AC
  Filled 2016-03-12: qty 1

## 2016-03-12 MED ORDER — LEVOTHYROXINE SODIUM 100 MCG PO TABS
100.0000 ug | ORAL_TABLET | Freq: Every day | ORAL | Status: DC
  Administered 2016-03-13 – 2016-03-14 (×2): 100 ug via ORAL
  Filled 2016-03-12 (×2): qty 1

## 2016-03-12 MED ORDER — BUTAMBEN-TETRACAINE-BENZOCAINE 2-2-14 % EX AERO
INHALATION_SPRAY | CUTANEOUS | Status: DC | PRN
  Administered 2016-03-12: 2 via TOPICAL

## 2016-03-12 SURGICAL SUPPLY — 2 items
LOOP REVEAL LINQSYS (Prosthesis & Implant Heart) ×1 IMPLANT
PACK LOOP INSERTION (CUSTOM PROCEDURE TRAY) ×2 IMPLANT

## 2016-03-12 NOTE — Progress Notes (Signed)
PT/OT Cancellation Note  Patient Details Name: Danielle Vargas MRN: [id number] DOB: 1951-08-07   Cancelled Treatment:    Reason Eval/Treat Not Completed: Patient not medically ready. Pt with change in medical status. Pt extremely lethargic, minimal eye opening. Pt said hi to therapist but unable to engage in conversation and unable to maintain eye opening. Alerted Agricultural consultant of this status, RN to call Dr. Erlinda Hong as there is no neurology note for today. Pt was ambulating with minA yesterday and per RN pt was maxAx3 to get to [facility]. PT/OT to return as appropriate/as able once medically status stable.   Kingsley Callander 03/12/2016, 2:04 PM   Kittie Plater, PT, DPT Pager #: [phone] Office #: [phone]

## 2016-03-12 NOTE — Consult Note (Signed)
ELECTROPHYSIOLOGY CONSULT NOTE  Patient ID: Danielle Vargas MRN: [id number], DOB/AGE: Dec 13, 1950   Admit date: 03/10/2016 Date of Consult: 03/12/2016  Primary Physician: Marton Redwood, MD Primary Cardiologist: Tamala Julian Reason for Consultation: Cryptogenic stroke; recommendations regarding Implantable Loop Recorder  History of Present Illness Shakkia Kaffenberger was admitted on 03/10/2016 with fall and left sided weakness.  They first developed symptoms while trying to get out of bed the night before admission.  Imaging demonstrated non-dominant right MCA infarct felt to be embolic 2/2 unknown source.  She has undergone workup for stroke including echocardiogram and carotid dopplers.  The patient has been monitored on telemetry which has demonstrated sinus rhythm with no arrhythmias.  Inpatient stroke work-up is to be completed with a TEE.   Echocardiogram this admission demonstrated normal LV function.  Lab work is reviewed.  Prior to admission, the patient denies chest pain, shortness of breath, dizziness, palpitations, or syncope.  They are recovering from their stroke with plans to go to CIR at discharge.  EP has been asked to evaluate for placement of an implantable loop recorder to monitor for atrial fibrillation.  Past Medical History  Diagnosis Date  . DM (diabetes mellitus) ([place])   . Obesity   . Dyslipidemia   . HTN (hypertension)   . Hypothyroidism   . Gastroparesis   . Migraine   . Osteoporosis   . Colon polyps 7/07    hyperplastic and adenomatous  . Nephrolithiasis   . Diverticulosis   . External hemorrhoid   . Fatty liver   . Asthma   . CAD (coronary artery disease)     a. 12/2008 Cath: mild irregs throughout, EF 75%.  . 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 Allergy   . Cataract     "just beginning"  . Myocardial infarction ([place])     FEb 2015  . Depression     pt unsure, psychologist said no  . Sleep apnea     wears CPAP  . Hyperlipidemia      Surgical  History:  Past Surgical History  Procedure Laterality Date  . Tubal ligation  '85  . Breast reduction surgery  '88  . Cervical disc surgery  '98    fusion  . Carpal tunnel release  2000    right wrist  . Rotator cuff repair  '09    left shoulder  . Ulnar nerve repair  '02, '08    left done, then right  . Cesarean section  '79  '84  . Left heart catheterization with coronary angiogram N/A 10/26/2013    Procedure: LEFT HEART CATHETERIZATION WITH CORONARY ANGIOGRAM;  Surgeon: Sinclair Grooms, MD;  Location: [facility] CATH LAB;  Service: Cardiovascular;  Laterality: N/A;  . Colonoscopy       Prescriptions prior to admission  Medication Sig Dispense Refill Last Dose  . albuterol (PROVENTIL HFA;VENTOLIN HFA) 108 (90 BASE) MCG/ACT inhaler Inhale 2 puffs into the lungs every 6 (six) hours as needed for wheezing or shortness of breath.    rescue at rescue  . amLODipine (NORVASC) 5 MG tablet Take 5 mg by mouth daily.   03/09/2016 at Unknown time  . aspirin EC 81 MG tablet Take 81 mg by mouth daily.    03/09/2016 at Unknown time  . budesonide-formoterol (SYMBICORT) 160-4.5 MCG/ACT inhaler Inhale 2 puffs into the lungs 2 (two) times daily as needed (shortness of breath).   Past Week at Unknown time  . calcium citrate-vitamin D (CITRACAL+D) 315-200 MG-UNIT tablet Take  1 tablet by mouth daily.   03/09/2016 at Unknown time  . carvedilol (COREG) 12.5 MG tablet Take 1 tablet (12.5 mg total) by mouth 2 (two) times daily with a meal. 180 tablet 0 03/09/2016 at 1800  . Coenzyme Q10 (CO Q 10) 100 MG CAPS Take 100 mg by mouth 2 (two) times daily.    03/09/2016 at Unknown time  . CRESTOR 20 MG tablet TAKE 1 TABLET EVERY DAY (Patient taking differently: TAKE 1 TABLET EVERY DAY AT BEDTIME) 90 tablet 1 03/09/2016 at Unknown time  . fenofibrate 160 MG tablet Take 160 mg by mouth at bedtime.    03/09/2016 at Unknown time  . furosemide (LASIX) 20 MG tablet Take 1 tablet (20 mg total) by mouth every morning. (Patient taking  differently: Take 20 mg by mouth daily. ) 90 tablet 3 03/09/2016 at Unknown time  . gabapentin (NEURONTIN) 300 MG capsule Take 300 mg by mouth 3 (three) times daily.   03/09/2016 at Unknown time  . HYDROcodone-acetaminophen (NORCO) 10-325 MG tablet Take 1 tablet by mouth 3 (three) times daily as needed (pain).   0 03/10/2016 at Unknown time  . insulin glargine (LANTUS) 100 UNIT/ML injection Inject into the skin 45 units in the morning and 45 units at bedtime. 90 mL 1 03/09/2016 at Unknown time  . insulin regular (HUMULIN R) 100 units/mL injection Inject 0.35-0.45 mLs (35-45 Units total) into the skin 3 (three) times daily before meals. (Patient taking differently: Inject 45-50 Units into the skin 3 (three) times daily before meals. ) 140 mL 1 03/09/2016 at Unknown time  . Insulin Syringe-Needle U-100 (INSULIN SYRINGE 1CC/31GX5/16") 31G X 5/16" 1 ML MISC Use to inject insulin 5 times daily. 500 each 3 03/09/2016 at Unknown time  . levothyroxine (SYNTHROID, LEVOTHROID) 75 MCG tablet Take 75 mcg by mouth daily.    03/09/2016 at Unknown time  . losartan (COZAAR) 50 MG tablet Take 50 mg by mouth 2 (two) times daily.    03/09/2016 at Unknown time  . metFORMIN (GLUCOPHAGE) 1000 MG tablet Take 1,000 mg by mouth 2 (two) times daily with a meal.   03/09/2016 at Unknown time  . metoCLOPramide (REGLAN) 10 MG tablet Take 10 mg by mouth 4 (four) times daily.    03/09/2016 at Unknown time  . mometasone (NASONEX) 50 MCG/ACT nasal spray Place 2 sprays into the nose daily. (Patient taking differently: Place 2 sprays into the nose daily as needed (allergies). ) 17 g 12 Past Month at Unknown time  . montelukast (SINGULAIR) 10 MG tablet Take 10 mg by mouth at bedtime.   03/09/2016 at Unknown time  . Multiple Vitamin (MULTIVITAMIN WITH MINERALS) TABS tablet Take 1 tablet by mouth daily. One A Day 50+   03/09/2016 at Unknown time  . nitroGLYCERIN (NITROSTAT) 0.4 MG SL tablet Place 1 tablet (0.4 mg total) under the tongue every 5 (five) minutes as  needed for chest pain. 25 tablet 3 unk at unk  . omeprazole (PRILOSEC) 20 MG capsule Take 20 mg by mouth 2 (two) times daily before a meal.   03/09/2016 at Unknown time  . pantoprazole (PROTONIX) 40 MG tablet Take 40 mg by mouth daily.   03/09/2016 at Unknown time  . polyethylene glycol (MIRALAX / GLYCOLAX) packet Take 17 g by mouth every other day.    Past Week at Unknown time  . tiZANidine (ZANAFLEX) 2 MG tablet Take 2 mg by mouth every 8 (eight) hours as needed for muscle spasms.    03/10/2016  at Unknown time  . topiramate (TOPAMAX) 50 MG tablet Take 50 mg by mouth 2 (two) times daily.   03/09/2016 at Unknown time  . Vitamin D, Ergocalciferol, (DRISDOL) 50000 UNITS CAPS Take 50,000 Units by mouth every 7 (seven) days. Thursdays   Past Week at Unknown time    Inpatient Medications:  . amLODipine  5 mg Oral Daily  . carvedilol  12.5 mg Oral BID WC  . cefTRIAXone (ROCEPHIN)  IV  1 g Intravenous Q24H  . clopidogrel  75 mg Oral Daily  . fenofibrate  160 mg Oral QHS  . gabapentin  300 mg Oral TID  . insulin aspart  0-15 Units Subcutaneous TID WC  . insulin aspart  0-5 Units Subcutaneous QHS  . insulin aspart  10 Units Subcutaneous TID WC  . insulin glargine  35 Units Subcutaneous BID  . levothyroxine  75 mcg Oral QAC breakfast  . losartan  50 mg Oral BID  . metoCLOPramide  10 mg Oral QID  . mometasone-formoterol  2 puff Inhalation BID  . montelukast  10 mg Oral QHS  . pantoprazole  40 mg Oral Daily  . polyethylene glycol  17 g Oral QODAY  . rosuvastatin  20 mg Oral Daily  . topiramate  50 mg Oral BID    Allergies:  Allergies  Allergen Reactions  . Invokana [Canagliflozin] Diarrhea  . Nsaids Other (See Comments)    GI problems/ stomach pain  . Penicillins Rash    Has patient had a PCN reaction causing immediate rash, facial/tongue/throat swelling, SOB or lightheadedness with hypotension: No (rash on legs after 3 days [place] -June 2016) Has patient had a PCN reaction causing severe rash  involving mucus membranes or skin necrosis: No Has patient had a PCN reaction that required hospitalization No Has patient had a PCN reaction occurring within the last 10 years: Yes If all of the above answers are "NO", then may proceed with Cephalosporin use.    Social History   Social History  . Marital Status: Divorced    Spouse Name: N/A  . Number of Children: 2  . Years of Education: N/A   Occupational History  . disabled    Social History Main Topics  . Smoking status: Never Smoker   . Smokeless tobacco: Never Used  . Alcohol Use: No  . Drug Use: No  . Sexual Activity: Not Currently    Birth Control/ Protection: Post-menopausal   Other Topics Concern  . Not on file   Social History Narrative   Lives in [facility] [place].  Retired.  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   Regular exercise: none   Caffeine use: daily; dt coke              Family History  Problem Relation Age of Onset  . COPD Father   . Stroke Father   . Emphysema Father   . Heart failure Brother   . Heart failure Mother   . Heart attack Mother     several  . Diabetes Mother   . Kidney disease Mother   . Hypertension Brother   . Colon cancer  45    mat. 1st cousin  . Other      celiac sprue, 1/2 brother  . Diabetes Brother     x 3  . Lung cancer Brother   . Cirrhosis Sister     liver transplant  . Liver disease Sister     transplant  . Heart attack Paternal  Grandfather   . Heart attack Brother   . Esophageal cancer Neg Hx   . Stomach cancer Neg Hx   . Rectal cancer Neg Hx   . Colon cancer Paternal Aunt 46     Review of Systems: All other systems reviewed and are otherwise negative except as noted above.  Physical Exam: Filed Vitals:   03/11/16 2107 03/11/16 2323 03/12/16 0118 03/12/16 0518  BP: 120/46  164/60 171/43  Pulse: 73 80 79 84  Temp: 97.8 F (36.6 C)  98.2 F (36.8 C) 98.4 F (36.9 C)  TempSrc: Oral  Oral Oral  Resp: 16 18 16 16   SpO2: 96%  98%  97%    GEN- The patient is obese appearing, alert and oriented x 3 today.   Head- normocephalic, atraumatic Eyes-  Sclera clear, conjunctiva pink Ears- hearing intact Oropharynx- clear Neck- supple Lungs- Clear to ausculation bilaterally, normal work of breathing Heart- Regular rate and rhythm, distant heart sounds GI- soft, NT, ND, + BS Extremities- no clubbing, cyanosis, or edema MS- no significant deformity or atrophy Skin- no rash or lesion Psych- euthymic mood, full affect   Labs:   Lab Results  Component Value Date   WBC 7.2 03/11/2016   HGB 12.2 03/11/2016   HCT 39.0 03/11/2016   MCV 89.0 03/11/2016   PLT 272 03/11/2016    Recent Labs Lab 03/10/16 1356 03/11/16 0507  NA 137 139  K 3.8 3.8  CL 109 110  CO2 20* 21*  BUN 24* 20  CREATININE 1.14* 1.07*  CALCIUM 9.5 9.2  PROT 6.6  --   BILITOT 0.8  --   ALKPHOS 40  --   ALT 30  --   AST 38  --   GLUCOSE 266* 203*    Radiology/Studies: Ct Angio Head W Or Wo Contrast 03/11/2016  CLINICAL DATA:  Continued surveillance RIGHT hemisphere infarction. LEFT hemiparesis. EXAM: CT ANGIOGRAPHY HEAD AND NECK TECHNIQUE: Multidetector CT imaging of the head and neck was performed using the standard protocol during bolus administration of intravenous contrast. Multiplanar CT image reconstructions and MIPs were obtained to evaluate the vascular anatomy. Carotid stenosis measurements (when applicable) are obtained utilizing NASCET criteria, using the distal internal carotid diameter as the denominator. CONTRAST:  Isovue 370, 50 mL. COMPARISON:  MRI brain 03/10/2016.  Noncontrast CT head 03/10/2016. FINDINGS: CT HEAD Calvarium and skull base: No fracture or destructive lesion. Mastoids and middle ears are grossly clear. Paranasal sinuses: Imaged portions are clear. Orbits: Negative. Brain: No hemorrhage is seen. Cytotoxic edema is now developing in the areas of restricted diffusion noted on MR. No hemorrhagic transformation. No midline  shift. Chronic atrophic change, stable. White matter hypoattenuation favored to represent small vessel disease. CTA NECK Aortic arch: Standard branching. Imaged portion shows no evidence of aneurysm or dissection. No significant stenosis of the major arch vessel origins. Right carotid system: Calcific plaque at the bifurcation extending into the RIGHT ICA. No evidence of dissection, stenosis (50% or greater) or occlusion. Left carotid system: Calcific plaque at the bifurcation, extending into the LEFT ICA No evidence of dissection, stenosis (50% or greater) or occlusion. Vertebral arteries: LEFT vertebral dominant. RIGHT vertebral diminutive no stenosis in the neck. No evidence of dissection, stenosis (50% or greater) or occlusion. Nonvascular soft tissues: No masses. Spondylosis. No lung apex lesion. Mediastinal lipomatosis reflecting increased body habitus. Previous cervical fusion [id number] appears uncomplicated. CTA HEAD Anterior circulation: No significant cavernous ICA disease. LEFT anterior circulation normal. On the RIGHT, there is an  early bifurcation of the M1 MCA. There is severe stenosis of the superior M2 segment corresponding to the areas of acute infarction. See image 228 series 402. The inferior M2 segment demonstrates mild stenosis at its origin, probably not flow reducing. No aneurysm, or vascular malformation. Posterior circulation: LEFT vertebral dominant. RIGHT vertebral has a diminutive connection to the basilar, but primarily supplies the RIGHT PICA. No significant stenosis, proximal occlusion, aneurysm, or vascular malformation. Venous sinuses: As permitted by contrast timing, patent. Anatomic variants: None of significance. Delayed phase:   No abnormal intracranial enhancement. IMPRESSION: Non stenotic extracranial carotid bifurcation disease, consisting of calcific plaque. Severe stenosis of the RIGHT MCA M2 superior division vessel, corresponding with the observed areas of infarction.  Developing nonhemorrhagic cytotoxic edema, RIGHT hemisphere, without significant mass effect. Electronically Signed   By: Staci Righter M.D.   On: 03/11/2016 09:29   Dg Chest 2 View 03/10/2016  CLINICAL DATA:  Left lower chest pain beginning after landing on left side for 6 hours. Weakness. EXAM: CHEST  2 VIEW COMPARISON:  11/10/2015 FINDINGS: Lungs are adequately inflated without focal consolidation or effusion. Stable cardiomegaly. There are degenerative changes of the spine. Partially visualized fusion hardware over the cervical spine intact. Surgical change of the left AC joint. Prominent overlying soft tissues. IMPRESSION: No active cardiopulmonary disease. Stable cardiomegaly. Electronically Signed   By: Marin Olp M.D.   On: 03/10/2016 17:25   Mr Brain Wo Contrast 03/10/2016  CLINICAL DATA:  Left-sided weakness for at least 2 days. Stroke risk factors include diabetes, hypertension, dyslipidemia, and previous stroke. EXAM: MRI HEAD WITHOUT CONTRAST TECHNIQUE: Multiplanar, multiecho pulse sequences of the brain and surrounding structures were obtained without intravenous contrast. COMPARISON:  CT head performed earlier today. FINDINGS: Extensive areas of both focal and confluent restricted diffusion throughout the RIGHT hemisphere involving the frontal, temporal, insular, and parietal regions, largely RIGHT MCA territory, consistent with acute infarction. No visible hemorrhage, mass lesion, hydrocephalus, or extra-axial fluid. Premature for age cerebral and cerebellar atrophy. Mild subcortical and periventricular T2 and FLAIR hyperintensities, likely chronic microvascular ischemic change. Remote LEFT insular cortex infarct. Old LEFT cerebellar infarct. No chronic hemorrhage. Flow voids are maintained in the internal carotid arteries, basilar artery, and LEFT vertebral artery. RIGHT vertebral is diminutive. There is poor flow related enhancement in the RIGHT M1 MCA, representing either long segment stenosis  or occlusion. No significant midline shift. Extracranial soft tissues unremarkable.  No midline abnormality. IMPRESSION: Extensive areas of both focal and confluent restricted diffusion throughout the RIGHT MCA distribution representing acute nonhemorrhagic infarction. Suspected RIGHT M1 MCA long segment stenosis or occlusion. Atrophy with small vessel disease. Areas of chronic infarction, most notable LEFT insula and LEFT cerebellum. Electronically Signed   By: Staci Righter M.D.   On: 03/10/2016 19:25   12-lead ECG sinus rhythm All prior EKG's in EPIC reviewed with no documented atrial fibrillation  Telemetry sinus rhythm  Assessment and Plan:  1. Cryptogenic stroke The patient presents with cryptogenic stroke.  The patient has a TEE planned for later today.  I spoke at length with the patient about monitoring for afib with an implantable loop recorder.  Risks, benefits, and alteratives to implantable loop recorder were discussed with the patient today.   At this time, the patient is very clear in their decision to proceed with implantable loop recorder.   Wound care was reviewed with the patient (keep incision clean and dry for 3 days).  Wound check scheduled for 7/17 at 2:30PM  Please call with questions.   Chanetta Marshall, NP 03/12/2016 7:35 AM   I have seen and examined this patient with Chanetta Marshall.  Agree with above, note added to reflect my findings.  On exam, regular rhythm, lungs clear, no murmurs.  Patient with cryptogenic stroke and continued symptoms but improving.  Possibly due to atrial fibrillation.  Nyah Shepherd plan for TEE this afternoon followed by [facility] monitor implant if TEE negative.  Risks and benefits explained.  Patient agrees to the procedure.    Lama Narayanan M. Kimbree Casanas MD 03/12/2016 8:03 AM

## 2016-03-12 NOTE — Progress Notes (Signed)
Inpatient Diabetes Program Recommendations  AACE/ADA: New Consensus Statement on Inpatient Glycemic Control (2015)  Target Ranges:  Prepandial:   less than 140 mg/dL      Peak postprandial:   less than 180 mg/dL (1-2 hours)      Critically ill patients:  140 - 180 mg/dL   Lab Results  Component Value Date   GLUCAP 212* 03/12/2016   HGBA1C 7.3* 03/11/2016    Review of Glycemic Control:  Results for SUPRENA, SACHDEVA (MRN [id number]) as of 03/12/2016 10:51  Ref. Range 03/11/2016 17:53 03/11/2016 20:01 03/12/2016 00:27 03/12/2016 03:50 03/12/2016 07:54  Glucose-Capillary Latest Ref Range: 65-99 mg/dL 358 (H) 388 (H) 251 (H) 212 (H) 212 (H)  Results for KIAN, CALISTRO (MRN [id number]) as of 03/12/2016 10:51  Ref. 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 Will follow.  Thanks, Adah Perl, RN, BC-ADM Inpatient Diabetes Coordinator Pager [phone] (8a-5p)

## 2016-03-12 NOTE — Progress Notes (Signed)
Discussed with RN change in pt functional mobility since yesterday.Noted Rapid Response event last pm. I contacted pt's daughter by phone to discuss a possible inpt rehab admission once medical workup complete. Daughter works full time and pt does not have any other assistance available at home. Will need to be Mod I to return home. I will follow up tomorrow. [phone]

## 2016-03-12 NOTE — Care Management Important Message (Signed)
Important Message  Patient Details  Name: Danielle Vargas MRN: [id number] Date of Birth: 04/11/1951   Medicare Important Message Given:  Yes    Loann Quill 03/12/2016, 11:32 AM

## 2016-03-12 NOTE — Op Note (Signed)
INDICATIONS: stroke  PROCEDURE:   Informed consent was obtained prior to the procedure. The risks, benefits and alternatives for the procedure were discussed and the patient comprehended these risks.  Risks include, but are not limited to, cough, sore throat, vomiting, nausea, somnolence, esophageal and stomach trauma or perforation, bleeding, low blood pressure, aspiration, pneumonia, infection, trauma to the teeth and death.    After a procedural time-out, the oropharynx was anesthetized with 20% benzocaine spray.   During this procedure the patient was administered a total of Versed 4 mg and Fentanyl 50 mcg to achieve and maintain moderate conscious sedation.  The patient's heart rate, blood pressure, and oxygen saturationweare monitored continuously during the procedure. The period of conscious sedation was 10 minutes, of which I was present face-to-face 100% of this time.  The transesophageal probe was inserted in the esophagus and stomach without difficulty and multiple views were obtained.  The patient was kept under observation until the patient left the procedure room.  The patient left the procedure room in stable condition.   Agitated microbubble saline contrast was not administered.  COMPLICATIONS:    There were no immediate complications.  FINDINGS:  No cardiac source of embolism. LVH and LA dilation present. Mild aortic arch plaque.  RECOMMENDATIONS:     Consider implantable loop recorder for occult atrial fibrillation.  Time Spent Directly with the Patient:  30 minutes   Danielle Vargas 03/12/2016, 9:09 AM

## 2016-03-12 NOTE — Progress Notes (Signed)
STROKE TEAM PROGRESS NOTE   SUBJECTIVE (INTERVAL HISTORY) Her CUS technologist is at the bedside. She had acute change last night while sitting in chair eating dinner. As per nurse, she had been sitting for extensive long time last night for visitors. She was found to have AMS and worsening left sided weakness. SBP in 90s, rapid response was called and pt symptoms fluctuated and BP back to normal range. Today, she was seen by me after TEE and she was sleepy and drowsy and left sided weakness much significant than yesterday. She likely to have right MCA infarct extended due to hypotension. Of note, she had again low SBP at 90s this morning not sure if related to sedation during TEE.   OBJECTIVE Temp:  [97 F (36.1 C)-98.6 F (37 C)] 98.4 F (36.9 C) (07/05 1346) Pulse Rate:  [73-99] 81 (07/05 1346) Cardiac Rhythm:  [-] Normal sinus rhythm (07/05 0702) Resp:  [15-28] 16 (07/05 1346) BP: (99-216)/(39-94) 162/81 mmHg (07/05 1346) SpO2:  [96 %-99 %] 96 % (07/05 1346)  CBC:   Recent Labs Lab 03/10/16 1356 03/11/16 0507  WBC 9.7 7.2  NEUTROABS 6.7  --   HGB 12.1 12.2  HCT 38.5 39.0  MCV 87.7 89.0  PLT 293 [id number]    Basic Metabolic Panel:   Recent Labs Lab 03/10/16 1356 03/11/16 0507  NA 137 139  K 3.8 3.8  CL 109 110  CO2 20* 21*  GLUCOSE 266* 203*  BUN 24* 20  CREATININE 1.14* 1.07*  CALCIUM 9.5 9.2    Lipid Panel:     Component Value Date/Time   CHOL 138 03/11/2016 0507   TRIG 232* 03/11/2016 0507   HDL 29* 03/11/2016 0507   CHOLHDL 4.8 03/11/2016 0507   VLDL 46* 03/11/2016 0507   LDLCALC 63 03/11/2016 0507   HgbA1c:  Lab Results  Component Value Date   HGBA1C 7.3* 03/11/2016   Urine Drug Screen:     Component Value Date/Time   LABOPIA POSITIVE* 03/10/2016 1500   COCAINSCRNUR NONE DETECTED 03/10/2016 1500   LABBENZ NONE DETECTED 03/10/2016 1500   AMPHETMU NONE DETECTED 03/10/2016 1500   THCU NONE DETECTED 03/10/2016 1500   LABBARB NONE DETECTED 03/10/2016  1500      IMAGING I have personally reviewed the radiological images below and agree with the radiology interpretations.  Dg Chest 2 View 03/10/2016   No active cardiopulmonary disease. Stable cardiomegaly.   Ct Head Wo Contrast 03/10/2016   Acute right MCA infarct involving the insular cortex and adjacent right frontal and temporal lobes. No hemorrhage.   Ct Cervical Spine Wo Contrast 03/10/2016  No acute bony abnormality in the cervical spine.   Mr Brain Wo Contrast 03/10/2016   Extensive areas of both focal and confluent restricted diffusion throughout the RIGHT MCA distribution representing acute nonhemorrhagic infarction. Suspected RIGHT M1 MCA long segment stenosis or occlusion. Atrophy with small vessel disease. Areas of chronic infarction, most notable LEFT insula and LEFT cerebellum.   CTA head and neck 03/11/2016 Non stenotic extracranial carotid bifurcation disease, consisting of calcific plaque. Severe stenosis of the RIGHT MCA M2 superior division vessel, corresponding with the observed areas of infarction. Developing nonhemorrhagic cytotoxic edema, RIGHT hemisphere, without significant mass effect.  TTE - - Very limited study. Only a few parasternal window images were  obtained, after which the patient was unable to tolerate the  study.  The left ventricle appears normal in overall size and function,  but wall motion cannot be analyzed.  The left  atrium is athe upper limit of normal in size.  The mitral valve appears grossly normal.  The other valves cannot be evaluated. The right heart chambers  are not seen. Probably no pericardial effusion.  TEE - No cardiac source of embolism. LVH and LA dilation present. Mild aortic arch plaque.  LE venous doppler - No evidence of DVT, superficial thrombosis, or Baker's Cyst.  Repeat MRI - pending   PHYSICAL EXAM  Temp:  [97 F (36.1 C)-98.6 F (37 C)] 98.4 F (36.9 C) (07/05 1346) Pulse Rate:  [73-99] 81 (07/05  1346) Resp:  [15-28] 16 (07/05 1346) BP: (99-216)/(39-94) 162/81 mmHg (07/05 1346) SpO2:  [96 %-99 %] 96 % (07/05 1346)  General - Well nourished, well developed, in no apparent distress, sleepy after TEE.  Ophthalmologic - Fundi not visualized due to noncooperation.  Cardiovascular - Regular rate and rhythm.  Mental Status -  Sleepy drowsy after TEE and orientation to place and person were intact, but not to time. Not cooperative on language tests.  Cranial Nerves II - XII - II - blinking to visual threat bilaterally. III, IV, VI - Extraocular movements intact. V - Facial sensation intact bilaterally. VII - left facial droop. VIII - Hearing & vestibular intact bilaterally. X - Palate elevates symmetrically. XI - Chin turning & shoulder shrug intact bilaterally. XII - Tongue protrusion intact.  Motor Strength - The patient's strength was 2+/5 LUE and 1/5 LLE significant worsening than yesterday. RUE and RLE at least 4/5 with spontaneous movements. Bulk was normal and fasciculations were absent.   Motor Tone - Muscle tone was assessed at the neck and appendages and was normal.  Reflexes - The patient's reflexes were 1+ in all extremities and she had positive left babinski.  Sensory - not cooperative on exam    Coordination - not cooperative on exam.  Tremor was absent.  Gait and Station - not tested.   ASSESSMENT/PLAN Ms. Alera Paschen is a 65 y.o. female with history of HTN, DB, HLD, CAD, MI, obesity, migraine, OSA presenting with L sided weakness. She did not receive IV t-PA due to delay in arrival.   Stroke:  Non-dominant right MCA territory infarct embolic secondary to unknown source. Overnight worsening left hemiplegia due to hypotension likely due to orthostasis.   Resultant  L hemiparesis, left facial droop   MRI  R MCA infarct. Suspect R M1 stenosis/occlusion. Old infarct L insula and L cerebellum.  Repeat limited MRI - pending  CTA head & neck  nonstenotic  extracranial disease. Severe stenosis R M2 superior branch.   2D Echo limited study   LE venous doppler negative for DVT  TEE no SOE Loop recorder placed.  LDL 63  HgbA1c 7.1  SCDs for VTE prophylaxis Diet heart healthy/carb modified Room service appropriate?: Yes; Fluid consistency:: Thin  aspirin 81 mg daily prior to admission, changed to clopidogrel 75 mg daily.  Patient counseled to be compliant with her antithrombotic medications  Ongoing aggressive stroke risk factor management  Therapy recommendations:  CIR  Disposition:  pending   Hypotension - likely due to orthostasis  Probably the cause for stroke extension  Keep best rest for 24 hours  BP monitoring  Avoid hypotension  Decreased coreg to 6.25mg  bid, and discontinued on norvasc 5mg  and cozaar 50mg   Permissive hypertension (OK if < 180/105) for 24-48 hours but gradually normalize in 5-7 days  IVF if needed for BP control  Long-term BP goal normotensive  Hyperlipidemia  Home meds:  crestor  20 and finofibrate 160, resumed in hospital  LDL 63, goal < 70  Continue statin and finofibrate at discharge  Diabetes  HgbA1c 7.1, goal < 7.0  Relatively controlled  Follow up with PCP  Other Stroke Risk Factors  Obesity, recommend weight loss, diet and exercise as appropriate   Family hx stroke (father)  CAD / MI in 2015  Migraines on topomax  OSA, on CPAP at home  Other Active Problems  Hypothyroidism  Hospital day # 2  The patient had acute neuro worsening due to hypotension. Left hemiplegia worsened significantly. And her SBP down to 90s overnight and this morning. She is at risk for recurrent strokes and neurological worsening and she needs ongoing BP monitoring, stroke evaluation and aggressive risk factor modification. I had long discussion with daughter over the phone, updated her about her condition, and work up plan and treatment options. She expressed understanding and appreciation.     Rosalin Hawking, MD PhD Stroke Neurology 03/12/2016 2:51 PM   To contact Stroke Continuity provider, please refer to [email or web address] After hours, contact General Neurology

## 2016-03-12 NOTE — Progress Notes (Signed)
  Echocardiogram Echocardiogram Transesophageal has been performed.  Roseanna Rainbow R 03/12/2016, 9:30 AM

## 2016-03-12 NOTE — Progress Notes (Signed)
VASCULAR LAB PRELIMINARY  PRELIMINARY  PRELIMINARY  PRELIMINARY  Bilateral lower extremity venous duplex has been completed.     Bilateral:  No evidence of DVT, superficial thrombosis, or Baker's Cyst.  Exam was difficult due to patient could not move her left leg.  Janifer Adie, RVT, RDMS 03/12/2016, 12:48 PM

## 2016-03-12 NOTE — Progress Notes (Signed)
PROGRESS NOTE  Danielle Vargas [id number] DOB: 1950/12/11 DOA: 03/10/2016 PCP: Marton Redwood, MD  Brief History:  65 year old female, lives alone, PMH of DM, HLD, HTN, hypothyroid, CAD, OSA on CPAP, sustained 2 falls from her bed on 2 consecutive days Friday and Saturday and laid on the floor for several hours. On the first occasion, she felt that because of left shoulder weakness from prior surgery, she was unable to get up-was evaluated by EMS but refused to come to the ED. However after the second episode, she realized she was weak on the left side which was worse and presented to ED. Diagnosed with acute stroke. Neurology following.  On the evening of 03/11/2016, the patient had increased slurred speech and drowsiness with questionable increased left-sided weakness. Repeat MRI of the brain on 03/12/2016 revealed mild extension of her right MCA infarct with increased cytotoxic edema  Assessment/Plan: Acute right brain stroke (nondominant right MCA territory infarct embolic secondary to unknown source) - Resultant left facial droop and left hemiparesis-better. - CT head 7/3: Acute right MCA infarct involving the insular cortex and adjacent right frontal and temporal lobes. No hemorrhage. - CT cervical spine 7/3: No acute bony abnormality of the cervical spine. - MRI head 03/10/16: Right MCA territory acute nonhemorrhagic infarction. Suspected right M1 MCA long segment stenosis or occlusion. - CTA head and neck 7/3: Severe stenosis of right MCA M2 superior division vessel, corresponding with the observed areas of infarction. Developing nonhemorrhagic cytotoxic edema, right hemisphere, without significant mass effect. Nonstenotic extracranial carotid bifurcation disease. - 2-D echo 03/11/16: Very limited study. The left ventricle appears normal in overall size and function -TEE--EF 60-65%, no thrombotic source -03/12/2016--loop recorder placed -Venous duplex lower extremities--negative  DVT - LDL 63 - A1c 7.3 - Was on aspirin 81 MG daily prior to admission, changed to clopidogrel 75 MG daily. -03/12/2016 repeat MRI brain--mild extension of right MCA infarct with increased cytotoxic edema due to orthostasis - Therapies recommend CIR-consulted -EKG personally reviewed--sinus rhythm, nonspecific T-wave changes  Essential hypertension - Allow for permissive hypertension.  -Decrease carvedilol 6.25 mg twice a day  -Discontinue amlodipine and losartan   Hyperlipidemia - LDL 63, goal <70. Continue Crestor and fenofibrate  Hypothyroidism -TSH 10.113 -Increase Synthroid to 100 g daily -Repeat TSH in 1 month  Type II DM - A1c 7.3, goal <7.  - Patient follows with Dr. Benjiman Core, Endocrinologist. She is on high doses of insulin's at home and states that she takes Lantus 45 units twice a day and Humulin R 45 units 3 times daily before meals. - Initially was started on Lantus 22 units subcutaneously twice a day and NovoLog sensitive SSI without bedtime coverage. - CBGs currently ranging from 175-313. - Increased Lantus to 40 units twice daily -continue SSI to moderate with night coverage and added mealtime NovoLog 10 units 3 times a day. CBGs to be closely monitored and insulin's rapidly titrated up as needed. - Discussed at length with patient's RN.  OSA on CPAP - Continue.  Obesity/There is no weight on file to calculate BMI. - Needs diet, exercise and weight loss.  Hypothyroid  Left-sided mid back pain/undisplaced ninth rib fracture - Supportive treatment. Discussed with patient and updated.  Elevated troponin - Mildly elevated troponin without chest pain and flat trend.? Related to acute stroke. Continue Plavix, statins, beta blockers.   Gram-negative rod UTI - Continue IV ceftriaxone pending final culture data  Chronic back pain - Continue home  medications.   Disposition Plan:   CIR in 1-2days  Family Communication:   Daughter updated at  bedside   Consultants:  Neurology  Code Status:  FULL / DNR  DVT Prophylaxis:  [place] Heparin / [place] Lovenox   Procedures: As Listed in Progress Note Above  Antibiotics: None    Subjective: Patient denies fevers, chills, headache, chest pain, dyspnea, nausea, vomiting, diarrhea, abdominal pain, dysuria, hematuria.    she feels that her slurred speech and left-sided weakness have stabilized since last evening.  Objective: Filed Vitals:   03/12/16 0932 03/12/16 1145 03/12/16 1346 03/12/16 1500  BP: 136/50 168/56 162/81 148/49  Pulse:  90 81 90  Temp:  98.6 F (37 C) 98.4 F (36.9 C) 98.4 F (36.9 C)  TempSrc:  Oral Oral Oral  Resp: 17 16 16 16   SpO2: 97% 98% 96% 96%   No intake or output data in the 24 hours ending 03/12/16 1810 Weight change:  Exam:   General:  Pt is alert, follows commands appropriately, not in acute distress  HEENT: No icterus, No thrush, No neck mass, Dent/AT  Cardiovascular: RRR, S1/S2, no rubs, no gallops  Respiratory: CTA bilaterally, no wheezing, no crackles, no rhonchi  Abdomen: Soft/+BS, non tender, non distended, no guarding  Extremities: No edema, No lymphangitis, No petechiae, No rashes, no synovitis   Data Reviewed: I have personally reviewed following labs and imaging studies Basic Metabolic Panel:  Recent Labs Lab 03/10/16 1356 03/11/16 0507  NA 137 139  K 3.8 3.8  CL 109 110  CO2 20* 21*  GLUCOSE 266* 203*  BUN 24* 20  CREATININE 1.14* 1.07*  CALCIUM 9.5 9.2   Liver Function Tests:  Recent Labs Lab 03/10/16 1356  AST 38  ALT 30  ALKPHOS 40  BILITOT 0.8  PROT 6.6  ALBUMIN 3.6   No results for input(s): LIPASE, AMYLASE in the last 168 hours. No results for input(s): AMMONIA in the last 168 hours. Coagulation Profile:  Recent Labs Lab 03/10/16 1648  INR 1.21   CBC:  Recent Labs Lab 03/10/16 1356 03/11/16 0507  WBC 9.7 7.2  NEUTROABS 6.7  --   HGB 12.1 12.2  HCT 38.5 39.0  MCV 87.7 89.0  PLT 293  272   Cardiac Enzymes:  Recent Labs Lab 03/10/16 1356 03/10/16 1648 03/10/16 2310 03/11/16 0507  CKTOTAL 357*  --   --   --   TROPONINI  --  0.10* 0.09* 0.08*   BNP: Invalid input(s): POCBNP CBG:  Recent Labs Lab 03/12/16 0027 03/12/16 0350 03/12/16 0754 03/12/16 1153 03/12/16 1640  GLUCAP 251* 212* 212* 259* 262*   HbA1C:  Recent Labs  03/11/16 0507  HGBA1C 7.3*   Urine analysis:    Component Value Date/Time   COLORURINE YELLOW 03/10/2016 1500   APPEARANCEUR CLOUDY* 03/10/2016 1500   LABSPEC 1.025 03/10/2016 1500   PHURINE 5.5 03/10/2016 1500   GLUCOSEU 500* 03/10/2016 1500   HGBUR MODERATE* 03/10/2016 1500   BILIRUBINUR NEGATIVE 03/10/2016 1500   KETONESUR 15* 03/10/2016 1500   PROTEINUR 30* 03/10/2016 1500   UROBILINOGEN 0.2 06/16/2015 1134   NITRITE POSITIVE* 03/10/2016 1500   LEUKOCYTESUR MODERATE* 03/10/2016 1500   Sepsis Labs: @LABRCNTIP (procalcitonin:4,lacticidven:4) ) Recent Results (from the past 240 hour(s))  Culture, Urine     Status: Abnormal (Preliminary result)   Collection Time: 03/10/16  3:00 PM  Result Value Ref Range Status   Specimen Description URINE, CATHETERIZED  Final   Special Requests NONE  Final   Culture (  A)  Final    >=100,000 COLONIES/mL GRAM NEGATIVE RODS IDENTIFICATION AND SUSCEPTIBILITIES TO FOLLOW    Report Status PENDING  Incomplete     Scheduled Meds: . carvedilol  6.25 mg Oral BID WC  . cefTRIAXone (ROCEPHIN)  IV  1 g Intravenous Q24H  . clopidogrel  75 mg Oral Daily  . fenofibrate  160 mg Oral QHS  . gabapentin  300 mg Oral TID  . insulin aspart  0-15 Units Subcutaneous TID WC  . insulin aspart  0-5 Units Subcutaneous QHS  . insulin aspart  10 Units Subcutaneous TID WC  . insulin glargine  35 Units Subcutaneous BID  . levothyroxine  75 mcg Oral QAC breakfast  . metoCLOPramide  10 mg Oral QID  . mometasone-formoterol  2 puff Inhalation BID  . montelukast  10 mg Oral QHS  . pantoprazole  40 mg Oral  Daily  . polyethylene glycol  17 g Oral QODAY  . rosuvastatin  20 mg Oral Daily  . topiramate  50 mg Oral BID   Continuous Infusions:   Procedures/Studies: Ct Angio Head W Or Wo Contrast  03/11/2016  CLINICAL DATA:  Continued surveillance RIGHT hemisphere infarction. LEFT hemiparesis. EXAM: CT ANGIOGRAPHY HEAD AND NECK TECHNIQUE: Multidetector CT imaging of the head and neck was performed using the standard protocol during bolus administration of intravenous contrast. Multiplanar CT image reconstructions and MIPs were obtained to evaluate the vascular anatomy. Carotid stenosis measurements (when applicable) are obtained utilizing NASCET criteria, using the distal internal carotid diameter as the denominator. CONTRAST:  Isovue 370, 50 mL. COMPARISON:  MRI brain 03/10/2016.  Noncontrast CT head 03/10/2016. FINDINGS: CT HEAD Calvarium and skull base: No fracture or destructive lesion. Mastoids and middle ears are grossly clear. Paranasal sinuses: Imaged portions are clear. Orbits: Negative. Brain: No hemorrhage is seen. Cytotoxic edema is now developing in the areas of restricted diffusion noted on MR. No hemorrhagic transformation. No midline shift. Chronic atrophic change, stable. White matter hypoattenuation favored to represent small vessel disease. CTA NECK Aortic arch: Standard branching. Imaged portion shows no evidence of aneurysm or dissection. No significant stenosis of the major arch vessel origins. Right carotid system: Calcific plaque at the bifurcation extending into the RIGHT ICA. No evidence of dissection, stenosis (50% or greater) or occlusion. Left carotid system: Calcific plaque at the bifurcation, extending into the LEFT ICA No evidence of dissection, stenosis (50% or greater) or occlusion. Vertebral arteries: LEFT vertebral dominant. RIGHT vertebral diminutive no stenosis in the neck. No evidence of dissection, stenosis (50% or greater) or occlusion. Nonvascular soft tissues: No masses.  Spondylosis. No lung apex lesion. Mediastinal lipomatosis reflecting increased body habitus. Previous cervical fusion [id number] appears uncomplicated. CTA HEAD Anterior circulation: No significant cavernous ICA disease. LEFT anterior circulation normal. On the RIGHT, there is an early bifurcation of the M1 MCA. There is severe stenosis of the superior M2 segment corresponding to the areas of acute infarction. See image 228 series 402. The inferior M2 segment demonstrates mild stenosis at its origin, probably not flow reducing. No aneurysm, or vascular malformation. Posterior circulation: LEFT vertebral dominant. RIGHT vertebral has a diminutive connection to the basilar, but primarily supplies the RIGHT PICA. No significant stenosis, proximal occlusion, aneurysm, or vascular malformation. Venous sinuses: As permitted by contrast timing, patent. Anatomic variants: None of significance. Delayed phase:   No abnormal intracranial enhancement. IMPRESSION: Non stenotic extracranial carotid bifurcation disease, consisting of calcific plaque. Severe stenosis of the RIGHT MCA M2 superior division vessel, corresponding with the  observed areas of infarction. Developing nonhemorrhagic cytotoxic edema, RIGHT hemisphere, without significant mass effect. Electronically Signed   By: Staci Righter M.D.   On: 03/11/2016 09:29   Dg Chest 2 View  03/10/2016  CLINICAL DATA:  Left lower chest pain beginning after landing on left side for 6 hours. Weakness. EXAM: CHEST  2 VIEW COMPARISON:  11/10/2015 FINDINGS: Lungs are adequately inflated without focal consolidation or effusion. Stable cardiomegaly. There are degenerative changes of the spine. Partially visualized fusion hardware over the cervical spine intact. Surgical change of the left AC joint. Prominent overlying soft tissues. IMPRESSION: No active cardiopulmonary disease. Stable cardiomegaly. Electronically Signed   By: Marin Olp M.D.   On: 03/10/2016 17:25   Dg Ribs Unilateral  Left  03/11/2016  CLINICAL DATA:  Fall.  Left side rib pain and back pain. EXAM: LEFT RIBS - 2 VIEW COMPARISON:  11/10/2015 FINDINGS: Acute left anterior ninth rib fracture deformity identified. No additional fractures or subluxations. IMPRESSION: 1. Left ninth rib fracture. Electronically Signed   By: Kerby Moors M.D.   On: 03/11/2016 12:54   Dg Thoracic Spine W/swimmers  03/11/2016  CLINICAL DATA:  65 year old female with a history of fall and rib pain EXAM: THORACIC SPINE - 3 VIEWS COMPARISON:  None. FINDINGS: Thoracic Spine: Thoracic vertebral elements maintain normal anatomic alignment, with no evidence of anterolisthesis, retrolisthesis, or subluxation. No acute fracture line identified. Vertebral body heights maintained. Mild degenerative disc disease. Unremarkable appearance of the visualized thorax. Surgical changes of prior anterior cervical discectomy infusion of the lower cervical region. IMPRESSION: No radiographic evidence of acute fracture or malalignment of thoracic spine. No displaced rib fracture identified on this plain film series. Signed, Dulcy Fanny. Earleen Newport, DO [facility] Electronically Signed   By: Corrie Mckusick D.O.   On: 03/11/2016 12:58   Ct Head Wo Contrast  03/11/2016  CLINICAL DATA:  Slurred speech and increase in left-sided weakness EXAM: CT HEAD WITHOUT CONTRAST TECHNIQUE: Contiguous axial images were obtained from the base of the skull through the vertex without intravenous contrast. COMPARISON:  Head CT March 10, 2016; brain MRI March 10, 2016; contrast-enhanced head CT March 11, 2016 FINDINGS: Brain: The ventricles are normal in size and configuration for age. There is again noted an infarct in the right middle cerebral artery distribution involving portions of the posterior right frontal lobe, much of the superior right temporal lobe, can the right superior temporal -anterior parietal junction. This infarct involves the  external capsule, claustrum, extreme capsule, and much of the insular cortex on the right. There is slightly more edema superiorly along the right posterior corona radiata compared to earlier in the day. Elsewhere, there is stable periventricular small vessel disease. No new infarct is evident. There is no mass, hemorrhage, or extra-axial fluid collection. Vascular: Calcification in both cavernous carotid artery regions is stable. Skull: Bony calvarium appears intact. Sinuses/Orbits: Orbits appear symmetric bilaterally. Paranasal sinuses are clear. Other: Mastoid air cells are clear. IMPRESSION: Right middle cerebral artery distribution infarct again noted with slightly more edema superiorly and posteriorly compared to earlier in the day. No new infarct evident. There is stable periventricular small vessel disease. No hemorrhage. There is no mass effect beyond the cytotoxic edema noted in the area of acute infarct. No extra-axial fluid collection. Electronically Signed   By: Lowella Grip III M.D.   On: 03/11/2016 21:02   Ct Head Wo Contrast  03/10/2016  CLINICAL DATA:  Fall, left-sided weakness. EXAM: CT  HEAD WITHOUT CONTRAST CT CERVICAL SPINE WITHOUT CONTRAST TECHNIQUE: Multidetector CT imaging of the head and cervical spine was performed following the standard protocol without intravenous contrast. Multiplanar CT image reconstructions of the cervical spine were also generated. COMPARISON:  MRI brain 06/10/2006 FINDINGS: CT HEAD FINDINGS There is low-density throughout the insular cortex and adjacent temporal lobe and frontal lobe on the right compatible with acute infarct. No hemorrhage. No hydrocephalus or midline shift. No acute calvarial abnormality. CT CERVICAL SPINE FINDINGS Prior anterior fusion from C5-C7. Degenerative changes at see C4-5 and C7-T1. No fracture. No malalignment. Prevertebral soft tissues are normal. No epidural or paraspinal hematoma. IMPRESSION: Acute right MCA infarct involving the  insular cortex and adjacent right frontal and temporal lobes. No hemorrhage. No acute bony abnormality in the cervical spine. Critical Value/emergent results were called by telephone at the time of interpretation on 03/10/2016 at 2:04 pm to Dr. Theotis Burrow , who verbally acknowledged these results. Electronically Signed   By: Rolm Baptise M.D.   On: 03/10/2016 14:04   Ct Angio Neck W Or Wo Contrast  03/11/2016  CLINICAL DATA:  Continued surveillance RIGHT hemisphere infarction. LEFT hemiparesis. EXAM: CT ANGIOGRAPHY HEAD AND NECK TECHNIQUE: Multidetector CT imaging of the head and neck was performed using the standard protocol during bolus administration of intravenous contrast. Multiplanar CT image reconstructions and MIPs were obtained to evaluate the vascular anatomy. Carotid stenosis measurements (when applicable) are obtained utilizing NASCET criteria, using the distal internal carotid diameter as the denominator. CONTRAST:  Isovue 370, 50 mL. COMPARISON:  MRI brain 03/10/2016.  Noncontrast CT head 03/10/2016. FINDINGS: CT HEAD Calvarium and skull base: No fracture or destructive lesion. Mastoids and middle ears are grossly clear. Paranasal sinuses: Imaged portions are clear. Orbits: Negative. Brain: No hemorrhage is seen. Cytotoxic edema is now developing in the areas of restricted diffusion noted on MR. No hemorrhagic transformation. No midline shift. Chronic atrophic change, stable. White matter hypoattenuation favored to represent small vessel disease. CTA NECK Aortic arch: Standard branching. Imaged portion shows no evidence of aneurysm or dissection. No significant stenosis of the major arch vessel origins. Right carotid system: Calcific plaque at the bifurcation extending into the RIGHT ICA. No evidence of dissection, stenosis (50% or greater) or occlusion. Left carotid system: Calcific plaque at the bifurcation, extending into the LEFT ICA No evidence of dissection, stenosis (50% or greater) or  occlusion. Vertebral arteries: LEFT vertebral dominant. RIGHT vertebral diminutive no stenosis in the neck. No evidence of dissection, stenosis (50% or greater) or occlusion. Nonvascular soft tissues: No masses. Spondylosis. No lung apex lesion. Mediastinal lipomatosis reflecting increased body habitus. Previous cervical fusion Q000111Q appears uncomplicated. CTA HEAD Anterior circulation: No significant cavernous ICA disease. LEFT anterior circulation normal. On the RIGHT, there is an early bifurcation of the M1 MCA. There is severe stenosis of the superior M2 segment corresponding to the areas of acute infarction. See image 228 series 402. The inferior M2 segment demonstrates mild stenosis at its origin, probably not flow reducing. No aneurysm, or vascular malformation. Posterior circulation: LEFT vertebral dominant. RIGHT vertebral has a diminutive connection to the basilar, but primarily supplies the RIGHT PICA. No significant stenosis, proximal occlusion, aneurysm, or vascular malformation. Venous sinuses: As permitted by contrast timing, patent. Anatomic variants: None of significance. Delayed phase:   No abnormal intracranial enhancement. IMPRESSION: Non stenotic extracranial carotid bifurcation disease, consisting of calcific plaque. Severe stenosis of the RIGHT MCA M2 superior division vessel, corresponding with the observed areas of infarction. Developing nonhemorrhagic  cytotoxic edema, RIGHT hemisphere, without significant mass effect. Electronically Signed   By: Staci Righter M.D.   On: 03/11/2016 09:29   Ct Cervical Spine Wo Contrast  03/10/2016  CLINICAL DATA:  Fall, left-sided weakness. EXAM: CT HEAD WITHOUT CONTRAST CT CERVICAL SPINE WITHOUT CONTRAST TECHNIQUE: Multidetector CT imaging of the head and cervical spine was performed following the standard protocol without intravenous contrast. Multiplanar CT image reconstructions of the cervical spine were also generated. COMPARISON:  MRI brain  06/10/2006 FINDINGS: CT HEAD FINDINGS There is low-density throughout the insular cortex and adjacent temporal lobe and frontal lobe on the right compatible with acute infarct. No hemorrhage. No hydrocephalus or midline shift. No acute calvarial abnormality. CT CERVICAL SPINE FINDINGS Prior anterior fusion from C5-C7. Degenerative changes at see C4-5 and C7-T1. No fracture. No malalignment. Prevertebral soft tissues are normal. No epidural or paraspinal hematoma. IMPRESSION: Acute right MCA infarct involving the insular cortex and adjacent right frontal and temporal lobes. No hemorrhage. No acute bony abnormality in the cervical spine. Critical Value/emergent results were called by telephone at the time of interpretation on 03/10/2016 at 2:04 pm to Dr. Theotis Burrow , who verbally acknowledged these results. Electronically Signed   By: Rolm Baptise M.D.   On: 03/10/2016 14:04   Mr Brain Wo Contrast  03/10/2016  CLINICAL DATA:  Left-sided weakness for at least 2 days. Stroke risk factors include diabetes, hypertension, dyslipidemia, and previous stroke. EXAM: MRI HEAD WITHOUT CONTRAST TECHNIQUE: Multiplanar, multiecho pulse sequences of the brain and surrounding structures were obtained without intravenous contrast. COMPARISON:  CT head performed earlier today. FINDINGS: Extensive areas of both focal and confluent restricted diffusion throughout the RIGHT hemisphere involving the frontal, temporal, insular, and parietal regions, largely RIGHT MCA territory, consistent with acute infarction. No visible hemorrhage, mass lesion, hydrocephalus, or extra-axial fluid. Premature for age cerebral and cerebellar atrophy. Mild subcortical and periventricular T2 and FLAIR hyperintensities, likely chronic microvascular ischemic change. Remote LEFT insular cortex infarct. Old LEFT cerebellar infarct. No chronic hemorrhage. Flow voids are maintained in the internal carotid arteries, basilar artery, and LEFT vertebral artery. RIGHT  vertebral is diminutive. There is poor flow related enhancement in the RIGHT M1 MCA, representing either long segment stenosis or occlusion. No significant midline shift. Extracranial soft tissues unremarkable.  No midline abnormality. IMPRESSION: Extensive areas of both focal and confluent restricted diffusion throughout the RIGHT MCA distribution representing acute nonhemorrhagic infarction. Suspected RIGHT M1 MCA long segment stenosis or occlusion. Atrophy with small vessel disease. Areas of chronic infarction, most notable LEFT insula and LEFT cerebellum. Electronically Signed   By: Staci Righter M.D.   On: 03/10/2016 19:25   Dg Op Swallowing Func-medicare/speech Path  02/13/2016  CLINICAL DATA:  Gastric paresis with choking episodes. EXAM: MODIFIED BARIUM SWALLOW TECHNIQUE: Different consistencies of barium were administered orally to the patient by the Speech Pathologist. Imaging of the pharynx was performed in the lateral projection. FLUOROSCOPY TIME:  Radiation Exposure Index (as provided by the fluoroscopic device): If the device does not provide the exposure index: Fluoroscopy Time:  0 minutes 34 seconds. Number of Acquired Images:  None. COMPARISON:  None. FINDINGS: Thin liquid- flash penetration.  No aspiration. Nectar thick liquid- not assessed. Honey- not assessed. Pure- within normal limits. Cracker-within normal limits. Pure with cracker- not assessed. Barium tablet -  within normal limits. IMPRESSION: Flash penetration with thin liquids.  Otherwise normal exam. Please refer to the Speech Pathologists report for complete details and recommendations. Electronically Signed   By: Rip Harbour  Blietz M.D.   On: 02/13/2016 13:58   Mr Brain Ltd W/o Cm  03/12/2016  CLINICAL DATA:  65 year old female with abrupt onset left side weakness beginning overnight July 1st-2nd, slid out of bed and was found down by her daughter 6 hours later. Right MCA infarct. Initial encounter. EXAM: MRI HEAD WITHOUT CONTRAST  TECHNIQUE: Multiplanar, multiecho pulse sequences of the brain and surrounding structures were obtained without intravenous contrast. COMPARISON:  Brain MRI 03/10/2016 and other recent studies. FINDINGS: The examination had to be discontinued prior to completion due to patient condition ; refusal to complete. Today images are also mildly degraded by motion despite repeated imaging attempts. Major intracranial vascular flow voids are stable. Increased volume of restricted diffusion in the right MCA territory, centered on the insula and operculum as before. The right basal ganglia remain spared. More of the posterior right temporal lobe and lateral right peri-Rolandic cortex is affected now. Associated increased T2 and FLAIR signal in a cytotoxic edema pattern. Mildly increased mass effect on the right lateral ventricle, but no evidence of associated hemorrhage. No midline shift. No ventriculomegaly. No contralateral or posterior fossa restricted diffusion. Stable gray and white matter signal outside of the acutely affected parenchyma. Small chronic left cerebellar infarcts again noted. Small chronic anterior left insula infarct again noted. Occasional other small chronic cortically based infarcts in both hemispheres were better demonstrated on the prior MRI due to motion today. Visualized paranasal sinuses and mastoids are stable and well pneumatized. Stable orbit and scalp soft tissues. IMPRESSION: 1. Mild extension of the acute right MCA infarct since 03/10/2016. Increased cytotoxic edema, but no associated hemorrhage or significant intracranial mass effect at this time. 2. No new intracranial abnormality. Electronically Signed   By: Genevie Ann M.D.   On: 03/12/2016 16:29    Lue [place], DO  [facility] Pager [phone]  If 7PM-7AM, please contact night-coverage [email or web address] Password [email or web address] 03/12/2016, 6:10 PM   LOS: 2 days

## 2016-03-12 NOTE — Progress Notes (Addendum)
RT able to wake Pt enough for her to say that she didn't want to use CPAP tonight.  RT unable to get pt to perform MDI at this time.  RT to monitor and assess as needed.

## 2016-03-12 NOTE — Significant Event (Signed)
Rapid Response Event Note  Overview: Time Called: 2015 Arrival Time: 2015 Event Type: Neurologic  Initial Focused Assessment: Danielle Vargas has a PMHx of HTN/CAD/MI/DM/HTN/Obesity/COPD, s/p fall 4 days ago, + RMCA infarct on CT/MRI.  Bedside RN asked to evaluate patient, patient was and has been more drowsy today, per bedside RN, patient's speech was more slurred, and her L-side weakness was worse now.  Patient had received NORCO 10-325 x 3 today, last dose [id number] (patient takes this at home for pain) and Zanaflex.  NP paged by bedside RN and CT was ordered.   Interventions: Patient did open her eyes to voice, not spontaneous, patient was oriented, NIH of 9 upon my assessment, prior score was a 5.  Patient was taken to CT with bedside RN, per CT, increased edema around infarct sites.  Neuro MD was updated by primary team, no new interventions.   Plan of Care (if not transferred): Informed bedside RN to ask primary team to keep patient NPO until patient is more aler and can safely take POs and to call RR RN if patient has acute neuro changes.   Event Summary: Name of Physician Notified: Baltazar Najjar at 1900    at 2017  Outcome: Stayed in room and stabalized  Event End Time: 2035  Danielle Vargas

## 2016-03-13 ENCOUNTER — Encounter (HOSPITAL_COMMUNITY): Payer: Self-pay | Admitting: Cardiovascular Disease

## 2016-03-13 LAB — BASIC METABOLIC PANEL
Anion gap: 9 (ref 5–15)
BUN: 14 mg/dL (ref 6–20)
CALCIUM: 9.2 mg/dL (ref 8.9–10.3)
CO2: 20 mmol/L — ABNORMAL LOW (ref 22–32)
CREATININE: 0.85 mg/dL (ref 0.44–1.00)
Chloride: 112 mmol/L — ABNORMAL HIGH (ref 101–111)
GFR calc Af Amer: >60 mL/min (ref >60)
GFR calc non Af Amer: >60 mL/min (ref >60)
Glucose, Bld: 155 mg/dL — ABNORMAL HIGH (ref 65–99)
POTASSIUM: 3.7 mmol/L (ref 3.5–5.1)
Sodium: 141 mmol/L (ref 135–145)

## 2016-03-13 LAB — GLUCOSE, CAPILLARY
GLUCOSE-CAPILLARY: 159 mg/dL — AB (ref 65–99)
Glucose-Capillary: 251 mg/dL — ABNORMAL HIGH (ref 65–99)
Glucose-Capillary: 206 mg/dL — ABNORMAL HIGH (ref 65–99)

## 2016-03-13 LAB — URINE CULTURE: Culture: >=100000 — AB

## 2016-03-13 MED ORDER — WHITE PETROLATUM GEL
Status: AC
  Administered 2016-03-13: 05:00:00
  Filled 2016-03-13: qty 1

## 2016-03-13 MED ORDER — CEFPODOXIME PROXETIL 200 MG PO TABS
200.0000 mg | ORAL_TABLET | Freq: Two times a day (BID) | ORAL | Status: DC
  Administered 2016-03-13 – 2016-03-14 (×2): 200 mg via ORAL
  Filled 2016-03-13 (×2): qty 1

## 2016-03-13 MED ORDER — INSULIN ASPART 100 UNIT/ML ~~LOC~~ SOLN
14.0000 [IU] | Freq: Three times a day (TID) | SUBCUTANEOUS | Status: DC
  Administered 2016-03-13 – 2016-03-14 (×3): 14 [IU] via SUBCUTANEOUS

## 2016-03-13 NOTE — Progress Notes (Signed)
Physical Therapy Treatment Patient Details Name: Danielle Vargas MRN: [id number] DOB: [birth year] Today's Date: 03/13/2016    History of Present Illness 65 yo female admitted s/p fall with R MCA PMH: obese L rotator cuff surg DM HTN migraine diverticulosis asthma cataracts MI depression. Repeat MRI brain--mild extension of right MCA infarct with increased cytotoxic edema due to orthostasis.    PT Comments    Pt has a change in functional status with new marked decreased in strength on left UE and LE due to extension of MCA infarct. Pt demonstrated 1-2+/5 LLE strength. She required mod assist with rolling, and 2+ max assist for standing/transfers. Goals were updated to reflect downgrade in function. Continue to recommend CIR. Will continue to follow acutely.  Follow Up Recommendations  CIR     Equipment Recommendations  Other (comment)    Recommendations for Other Services       Precautions / Restrictions Precautions Precautions: Fall Restrictions Weight Bearing Restrictions: No    Mobility  Bed Mobility Overal bed mobility: Needs Assistance Bed Mobility: Rolling;Sidelying to Sit Rolling: Mod assist Sidelying to sit: +2 for physical assistance;Max assist       General bed mobility comments: Pt educated on rolling technique due to decreased L sided strength.  Transfers Overall transfer level: Needs assistance   Transfers: Sit to/from Stand;Stand Pivot Transfers Sit to Stand: +2 physical assistance;Max assist Stand pivot transfers: +2 physical assistance;Total assist       General transfer comment: Pt was resting back of thighs on bedrail to stabilize. Pt's left knee was locked into extension while standing to prevent buckling. (Simultaneous filing. User may not have seen previous data.)  Ambulation/Gait                 Stairs            Wheelchair Mobility    Modified Rankin (Stroke Patients Only) Modified Rankin (Stroke Patients Only) Pre-Morbid  Rankin Score: No symptoms Modified Rankin: Severe disability     Balance Overall balance assessment: Needs assistance Sitting-balance support: Feet supported;Bilateral upper extremity supported Sitting balance-Leahy Scale: Zero Sitting balance - Comments: Pt demonstrates strong L lean in sitting at EOB. Postural control: Left lateral lean Standing balance support: Bilateral upper extremity supported Standing balance-Leahy Scale: Zero Standing balance comment: Pt was resting back of thighs on bedrail to stabilize. Pt's left knee was locked into extension while standing to prevent buckling.                    Cognition Arousal/Alertness: Awake/alert Behavior During Therapy:  (Easily distracted, lots of cues to stay on task.) Overall Cognitive Status: Impaired/Different from baseline Area of Impairment: Memory;Safety/judgement     Memory: Decreased short-term memory   Safety/Judgement: Decreased awareness of safety     General Comments: Pt recalled name of PT from treatment two days prior.     Exercises      General Comments        Pertinent Vitals/Pain Pain Assessment: Faces Faces Pain Scale: Hurts little more Pain Location: L ribs Pain Intervention(s): Monitored during session;Repositioned    Home Living                      Prior Function            PT Goals (current goals can now be found in the care plan section) Acute Rehab PT Goals Patient Stated Goal: to get out of this bed. PT Goal Formulation: With patient Time For  Goal Achievement: 03/18/16 Potential to Achieve Goals: Good Progress towards PT goals: Goals downgraded-see care plan    Frequency  Min 4X/week    PT Plan Current plan remains appropriate    Co-evaluation PT/OT/SLP Co-Evaluation/Treatment: Yes Reason for Co-Treatment: Complexity of the patient's impairments (multi-system involvement);For patient/therapist safety PT goals addressed during session: Mobility/safety with  mobility;Balance;Strengthening/ROM       End of Session Equipment Utilized During Treatment:  (back of recliner) Activity Tolerance: Patient limited by fatigue Patient left: in chair;with call bell/phone within reach;with chair alarm set     Time: 1520-1547 PT Time Calculation (min) (ACUTE ONLY): 27 min  Charges:  $Therapeutic Activity: 8-22 mins                    G Codes:      Jaydan Chretien 03/22/16, 4:26 PM  Tawni Millers, SPT (student physical therapist) [place] [phone]

## 2016-03-13 NOTE — Progress Notes (Signed)
OT Cancellation Note  Patient Details Name: Melesa Badamo MRN: [id number] DOB: Feb 20, 1951   Cancelled Treatment:    Reason Eval/Treat Not Completed: Patient not medically ready (active bedrest orders). Will follow up for OT treatment with discontinuation of bedrest and updated activity orders.   Binnie Kand M.S., OTR/L Pager: [phone]  03/13/2016, 7:40 AM

## 2016-03-13 NOTE — Care Management Note (Signed)
Case Management Note  Patient Details  Name: Danielle Vargas MRN: [id number] Date of Birth: 10/17/1950  Subjective/Objective:                    Action/Plan: Pt with worsening lt sided weakness. Plan when medically stable is CIR vs SNF. CM following for d/c needs.   Expected Discharge Date:                  Expected Discharge Plan:  [place]  In-House Referral:     Discharge planning Services     Post Acute Care Choice:    Choice offered to:     DME Arranged:    DME Agency:     HH Arranged:    [place] Agency:     Status of Service:  In process, will continue to follow  If discussed at Long Length of Stay Meetings, dates discussed:    Additional Comments:  Pollie Friar, RN 03/13/2016, 11:19 AM

## 2016-03-13 NOTE — Progress Notes (Signed)
No orthostatics order; error in charting

## 2016-03-13 NOTE — Progress Notes (Signed)
Occupational Therapy Treatment Patient Details Name: Genet Fischl MRN: [id number] DOB: 09-Aug-1951 Today's Date: 03/13/2016    History of present illness 65 yo female admitted s/p fall with R MCA PMH: obese L rotator cuff surg DM HTN migraine diverticulosis asthma cataracts MI depression. Repeat MRI brain--mild extension of right MCA infarct with increased cytotoxic edema due to orthostasis.   OT comments  Pt with decline in functional status since prior OT session. Currently pt able to perform basic transfers with max assist +2. Pt presenting with flaccid L UE; no active movement noted. Pt is easily distracted and requires max verbal cues for focusing on task. Educated pt on proper positioning of LUE, attempting to participate in functional activities with LUE. Pt tolerated weight bearing through LUE in sitting and standing. Goals updated to reflect decline. At this time continue to recommend CIR for follow up to maximize independence and safety with ADLs and functional mobility. Will continue to follow acutely.   Follow Up Recommendations  CIR    Equipment Recommendations  Other (comment) (TBD at next venue)    Recommendations for Other Services      Precautions / Restrictions Precautions Precautions: Fall Restrictions Weight Bearing Restrictions: No       Mobility Bed Mobility Overal bed mobility: Needs Assistance Bed Mobility: Rolling;Sidelying to Sit Rolling: Mod assist Sidelying to sit: Max assist;+2 for physical assistance       General bed mobility comments: VCs for technique. Assist to bring LEs off EOB and to bring trunk from sidelying to sit.  Transfers Overall transfer level: Needs assistance Equipment used: None Transfers: Sit to/from [facility] Sit to Stand: Max assist;+2 physical assistance Stand pivot transfers: Max assist;+2 physical assistance       General transfer comment: L knee blocked during sit to stand and transfer. L knee  locked into full extension in standing.    Balance Overall balance assessment: Needs assistance Sitting-balance support: Feet supported;Bilateral upper extremity supported Sitting balance-Leahy Scale: Poor Sitting balance - Comments: L lateral lean in sitting. Pt able to correct at times with max verbal cues but unable to sustain. Postural control: Left lateral lean Standing balance support: Bilateral upper extremity supported Standing balance-Leahy Scale: Zero Standing balance comment: Max assist +2 to maintain standing balance.                   ADL Overall ADL's : Needs assistance/impaired                         Toilet Transfer: Maximal assistance;+2 for physical assistance;Stand-pivot;BSC Toilet Transfer Details (indicate cue type and reason): Simulated by stand pivot transfer from EOB to chair.         Functional mobility during ADLs: Maximal assistance;+2 for physical assistance (for stand pivot only) General ADL Comments: Pt with no active movement noted in L UE. Educated pt on attempting to move/use L UE functionally, proper positioning of LUE. Pt easily distracted and talking a lot, max verbal cues to focus on task.      Vision                     Perception     Praxis      Cognition   Behavior During Therapy: Impulsive (easily distracted) Overall Cognitive Status: Impaired/Different from baseline Area of Impairment: Memory;Safety/judgement     Memory: Decreased short-term memory    Safety/Judgement: Decreased awareness of safety     General Comments: Pt  recalled name of PT from treatment two days prior.     Extremity/Trunk Assessment               Exercises Other Exercises Other Exercises: Educated pt on focusing on movement with L UE and LE. Pt able to perfom some active movement with LLE, no active movement noted with LUE. Pt able to shoulder shrug to compensate for attempts at L shoulder flexion.  Other Exercises: Pt  tolerated sitting EOB with weight bearing through LUE x 5 min. Strong L lateral lean. Other Exercises: Pt tolerated weight bearing through L forearm in standing x 1 min.   Shoulder Instructions       General Comments      Pertinent Vitals/ Pain       Pain Assessment: Faces Faces Pain Scale: Hurts little more Pain Location: L ribs Pain Intervention(s): Monitored during session;Repositioned  Home Living                                          Prior Functioning/Environment              Frequency Min 3X/week     Progress Toward Goals  OT Goals(current goals can now be found in the care plan section)  Progress towards OT goals: Not progressing toward goals - comment  Acute Rehab OT Goals Patient Stated Goal: to get out of this bed. OT Goal Formulation: With patient ADL Goals Pt Will Perform Upper Body Bathing: with min assist;sitting Pt Will Transfer to Toilet: with mod assist;stand pivot transfer;bedside commode  Plan Discharge plan remains appropriate;Equipment recommendations need to be updated    Co-evaluation    PT/OT/SLP Co-Evaluation/Treatment: Yes Reason for Co-Treatment: Complexity of the patient's impairments (multi-system involvement);For patient/therapist safety PT goals addressed during session: Mobility/safety with mobility;Balance;Strengthening/ROM OT goals addressed during session: Other (comment);Strengthening/ROM (functional mobility)      End of Session Equipment Utilized During Treatment: Gait belt   Activity Tolerance Patient tolerated treatment well   Patient Left in chair;with call bell/phone within reach;with chair alarm set   Nurse Communication          Time: [phone] OT Time Calculation (min): 27 min  Charges: OT General Charges $OT Visit: 1 Procedure OT Treatments $Therapeutic Activity: 8-22 mins  Binnie Kand M.S., OTR/L Pager: [phone]  03/13/2016, 5:00 PM

## 2016-03-13 NOTE — Progress Notes (Signed)
STROKE TEAM PROGRESS NOTE   SUBJECTIVE (INTERVAL HISTORY) Pt son in law is at bedside. She is more awake alert today. LUE flaccid and LLE barely against gravity. Repeat MRI showed right MCA extended. Overnight BP stable no hypotension.    OBJECTIVE Temp:  [97.9 F (36.6 C)-98.6 F (37 C)] 98 F (36.7 C) (07/06 0523) Pulse Rate:  [74-94] 83 (07/06 0900) Cardiac Rhythm:  [-] Normal sinus rhythm (07/05 1900) Resp:  [15-18] 18 (07/06 0523) BP: (99-191)/(39-81) 140/79 mmHg (07/06 0900) SpO2:  [96 %-98 %] 98 % (07/06 0523) Weight:  [96.9 kg (213 lb 10 oz)] 96.9 kg (213 lb 10 oz) (07/06 0534)  CBC:   Recent Labs Lab 03/10/16 1356 03/11/16 0507  WBC 9.7 7.2  NEUTROABS 6.7  --   HGB 12.1 12.2  HCT 38.5 39.0  MCV 87.7 89.0  PLT 293 [id number]    Basic Metabolic Panel:   Recent Labs Lab 03/11/16 0507 03/13/16 0519  NA 139 141  K 3.8 3.7  CL 110 112*  CO2 21* 20*  GLUCOSE 203* 155*  BUN 20 14  CREATININE 1.07* 0.85  CALCIUM 9.2 9.2    Lipid Panel:     Component Value Date/Time   CHOL 138 03/11/2016 0507   TRIG 232* 03/11/2016 0507   HDL 29* 03/11/2016 0507   CHOLHDL 4.8 03/11/2016 0507   VLDL 46* 03/11/2016 0507   LDLCALC 63 03/11/2016 0507   HgbA1c:  Lab Results  Component Value Date   HGBA1C 7.3* 03/11/2016   Urine Drug Screen:     Component Value Date/Time   LABOPIA POSITIVE* 03/10/2016 1500   COCAINSCRNUR NONE DETECTED 03/10/2016 1500   LABBENZ NONE DETECTED 03/10/2016 1500   AMPHETMU NONE DETECTED 03/10/2016 1500   THCU NONE DETECTED 03/10/2016 1500   LABBARB NONE DETECTED 03/10/2016 1500      IMAGING I have personally reviewed the radiological images below and agree with the radiology interpretations.  Dg Chest 2 View 03/10/2016   No active cardiopulmonary disease. Stable cardiomegaly.   Ct Head Wo Contrast 03/10/2016   Acute right MCA infarct involving the insular cortex and adjacent right frontal and temporal lobes. No hemorrhage.   Ct Cervical  Spine Wo Contrast 03/10/2016  No acute bony abnormality in the cervical spine.   Mr Brain Wo Contrast 03/10/2016   Extensive areas of both focal and confluent restricted diffusion throughout the RIGHT MCA distribution representing acute nonhemorrhagic infarction. Suspected RIGHT M1 MCA long segment stenosis or occlusion. Atrophy with small vessel disease. Areas of chronic infarction, most notable LEFT insula and LEFT cerebellum.   CTA head and neck 03/11/2016 Non stenotic extracranial carotid bifurcation disease, consisting of calcific plaque. Severe stenosis of the RIGHT MCA M2 superior division vessel, corresponding with the observed areas of infarction. Developing nonhemorrhagic cytotoxic edema, RIGHT hemisphere, without significant mass effect.  TTE - - Very limited study. Only a few parasternal window images were  obtained, after which the patient was unable to tolerate the  study.  The left ventricle appears normal in overall size and function,  but wall motion cannot be analyzed.  The left atrium is athe upper limit of normal in size.  The mitral valve appears grossly normal.  The other valves cannot be evaluated. The right heart chambers  are not seen. Probably no pericardial effusion.  TEE - No cardiac source of embolism. LVH and LA dilation present. Mild aortic arch plaque.  LE venous doppler - No evidence of DVT, superficial thrombosis, or Baker's Cyst.  Repeat limited MRI - 1. Mild extension of the acute right MCA infarct since 03/10/2016. Increased cytotoxic edema, but no associated hemorrhage or significant intracranial mass effect at this time. 2. No new intracranial abnormality.   PHYSICAL EXAM  Temp:  [97.9 F (36.6 C)-98.6 F (37 C)] 98 F (36.7 C) (07/06 0523) Pulse Rate:  [74-94] 83 (07/06 0900) Resp:  [15-18] 18 (07/06 0523) BP: (99-191)/(39-81) 140/79 mmHg (07/06 0900) SpO2:  [96 %-98 %] 98 % (07/06 0523) Weight:  [96.9 kg (213 lb 10 oz)] 96.9 kg (213 lb  10 oz) (07/06 0534)  General - Well nourished, well developed, in no apparent distress.  Ophthalmologic - Fundi not visualized due to noncooperation.  Cardiovascular - Regular rate and rhythm.  Mental Status -  Awake alert and orientation to place and person were intact, but not to time. Not cooperative on language tests.  Cranial Nerves II - XII - II - blinking to visual threat bilaterally. III, IV, VI - Extraocular movements intact. V - Facial sensation intact bilaterally. VII - left facial droop. VIII - Hearing & vestibular intact bilaterally. X - Palate elevates symmetrically. XI - Chin turning & shoulder shrug intact bilaterally. XII - Tongue protrusion intact.  Motor Strength - The patient's strength was 0/5 LUE and 2/5 LLE. RUE and RLE 5/5. Bulk was normal and fasciculations were absent.   Motor Tone - Muscle tone was assessed at the neck and appendages and was normal.  Reflexes - The patient's reflexes were 1+ in all extremities and she had positive left babinski.  Sensory - symmetrical bilaterally    Coordination - no ataxia or dysmetria right hand.  Tremor was absent.  Gait and Station - not tested.   ASSESSMENT/PLAN [name] is a 65 y.o. female with history of HTN, DB, HLD, CAD, MI, obesity, migraine, OSA presenting with L sided weakness. She did not receive IV t-PA due to delay in arrival.   Stroke:  Non-dominant right MCA territory infarct embolic secondary to unknown source. Extension of right MCA infarct due to hypotension episodes.   Resultant  L hemiparesis, left facial droop   MRI  R MCA infarct. Suspect R M1 stenosis/occlusion. Old infarct L insula and L cerebellum.  Repeat limited MRI - extension of right MCA territory infarcts  CTA head & neck  nonstenotic extracranial disease. Severe stenosis R M2 superior branch.   2D Echo limited study   LE venous doppler negative for DVT   TEE no SOE  Loop recorder placed.  LDL 63  HgbA1c  7.1  SCDs for VTE prophylaxis Diet heart healthy/carb modified Room service appropriate?: Yes; Fluid consistency:: Thin  aspirin 81 mg daily prior to admission, changed to clopidogrel 75 mg daily. Continue plavix on discharge.  Patient counseled to be compliant with her antithrombotic medications  Ongoing aggressive stroke risk factor management  Therapy recommendations:  CIR  Disposition: pending  Hypotension - likely due to orthostasis  Probably the cause for stroke extension  BP stable 140-160  Avoid hypotension  Decreased coreg to 6.25mg  bid, and discontinued on norvasc 5mg  and cozaar 50mg   Permissive hypertension (OK if < 180/105) for 24-48 hours but gradually normalize in 5-7 days  IVF if needed for hypotension  Long-term BP goal normotensive  Hyperlipidemia  Home meds:  crestor 20 and finofibrate 160, resumed in hospital  LDL 63, goal < 70  Continue statin and finofibrate at discharge  Diabetes  HgbA1c 7.1, goal < 7.0  Relatively controlled  Follow up  with PCP  Other Stroke Risk Factors  Obesity, recommend weight loss, diet and exercise as appropriate   Family hx stroke (father)  CAD / MI in 2015  Migraines on topomax  OSA, on CPAP at home  Other Active Problems  Hypothyroidism  Hospital day # 3  Neurology will sign off. Please call with questions. Pt will follow up with Dr. Erlinda Hong at [facility] in about 2 months. Thanks for the consult.  Rosalin Hawking, MD PhD Stroke Neurology 03/13/2016 5:04 PM    To contact Stroke Continuity provider, please refer to [email or web address] After hours, contact General Neurology

## 2016-03-13 NOTE — Progress Notes (Signed)
Noted pt on bedrest. Will need updated therapy assessments to determine most appropriate rehab venue given her change in neuro status. I will follow [phone]

## 2016-03-13 NOTE — Progress Notes (Signed)
   03/13/16 2346  BiPAP/CPAP/SIPAP  BiPAP/CPAP/SIPAP Pt Type Adult  Mask Type Nasal mask  Mask Size Medium  Respiratory Rate 18 breaths/min  IPAP (Imax =12)  EPAP (E Min=5)  Oxygen Percent 21 %  Flow Rate 0 lpm  BiPAP/CPAP/SIPAP CPAP  Patient Home Equipment No  Auto Titrate Yes

## 2016-03-13 NOTE — Progress Notes (Signed)
PROGRESS NOTE  Danielle Vargas [id number] DOB: 10/09/1950 DOA: 03/10/2016 PCP: Marton Redwood, MD  Brief History:  65 year old female, lives alone, PMH of DM, HLD, HTN, hypothyroid, CAD, OSA on CPAP, sustained 2 falls from her bed on 2 consecutive days Friday and Saturday and laid on the floor for several hours. On the first occasion, she felt that because of left shoulder weakness from prior surgery, she was unable to get up-was evaluated by EMS but refused to come to the ED. However after the second episode, she realized she was weak on the left side which was worse and presented to ED. Diagnosed with acute stroke. Neurology following. On the evening of 03/11/2016, the patient had increased slurred speech and drowsiness with questionable increased left-sided weakness. Repeat MRI of the brain on 03/12/2016 revealed mild extension of her right MCA infarct with increased cytotoxic edema  Assessment/Plan: Acute right brain stroke (nondominant right MCA territory infarct embolic secondary to unknown source) - Resultant left facial droop and left hemiparesis-better. - CT head 7/3: Acute right MCA infarct involving the insular cortex and adjacent right frontal and temporal lobes. No hemorrhage. - CT cervical spine 7/3: No acute bony abnormality of the cervical spine. - MRI head 03/10/16: Right MCA territory acute nonhemorrhagic infarction. Suspected right M1 MCA long segment stenosis or occlusion. - CTA head and neck 7/3: Severe stenosis of right MCA M2 superior division vessel, corresponding with the observed areas of infarction. Developing nonhemorrhagic cytotoxic edema, right hemisphere, without significant mass effect. Nonstenotic extracranial carotid bifurcation disease. - 2-D echo 03/11/16: Very limited study. The left ventricle appears normal in overall size and function -TEE--EF 60-65%, no thrombotic source -03/12/2016--loop recorder placed -Venous duplex lower extremities--negative  DVT - LDL 63 - A1c 7.3 - Was on aspirin 81 MG daily prior to admission, changed to clopidogrel 75 MG daily. -03/12/2016 repeat MRI brain--mild extension of right MCA infarct with increased cytotoxic edema due to orthostasis - Therapies recommend CIR-consulted -EKG personally reviewed--sinus rhythm, nonspecific T-wave changes  Essential hypertension - Allow for permissive hypertension.  -Decrease carvedilol 6.25 mg twice a day  -Discontinue amlodipine and losartan   Hyperlipidemia - LDL 63, goal <70. Continue Crestor and fenofibrate  Hypothyroidism -TSH 10.113 -Increase Synthroid to 100 g daily -Repeat TSH in 1 month  Type II DM - A1c 7.3, goal <7.  - Patient follows with Dr. Benjiman Core, Endocrinologist. She is on high doses of insulin's at home and states that she takes Lantus 45 units twice a day and Humulin R 45 units 3 times daily before meals. - Initially was started on Lantus 22 units subcutaneously twice a day and NovoLog sensitive SSI without bedtime coverage. - Increased Lantus to 40 units twice daily -continue SSI to moderate  --Increase pre-meal NovoLog 14 units - Discussed at length with patient's RN.  OSA on CPAP - Continue.  Obesity/There is no weight on file to calculate BMI. - Needs diet, exercise and weight loss.  Hypothyroid -TSH 10.113 -increase synthroid to 100 mcg daily  Left-sided mid back pain/undisplaced ninth rib fracture - Supportive treatment. Discussed with patient and updated.  Elevated troponin - Mildly elevated troponin without chest pain and flat trend.? Related to acute stroke. Continue Plavix, statins, beta blockers.   Morganella UTI - ceftriaxone-->cefpodoxime on 03/13/16  Chronic back pain - Continue home medications.   Disposition Plan: CIR in 1-2days  Family Communication: Daughter updated at bedside   Consultants: Neurology  Code Status: FULL /  DNR  DVT Prophylaxis: [place] Heparin / [place]  Lovenox   Procedures: As Listed in Progress Note Above  Antibiotics: None   Subjective:   Objective: Filed Vitals:   03/13/16 0534 03/13/16 0900 03/13/16 1300 03/13/16 1656  BP:  140/79 147/50 147/50  Pulse:  83 84   Temp:  98.4 F (36.9 C) 98 F (36.7 C)   TempSrc:  Oral Oral   Resp:   18   Height: 5\' 1"  (1.549 m)     Weight: 96.9 kg (213 lb 10 oz)     SpO2:   96%     Intake/Output Summary (Last 24 hours) at 03/13/16 1711 Last data filed at 03/13/16 1200  Gross per 24 hour  Intake    680 ml  Output      0 ml  Net    680 ml   Weight change:  Exam:   General:  Pt is alert, follows commands appropriately, not in acute distress  HEENT: No icterus, No thrush, No neck mass, [place]/AT  Cardiovascular: RRR, S1/S2, no rubs, no gallops  Respiratory: CTA bilaterally, no wheezing, no crackles, no rhonchi  Abdomen: Soft/+BS, non tender, non distended, no guarding  Extremities: No edema, No lymphangitis, No petechiae, No rashes, no synovitis   Data Reviewed: I have personally reviewed following labs and imaging studies Basic Metabolic Panel:  Recent Labs Lab 03/10/16 1356 03/11/16 0507 03/13/16 0519  NA 137 139 141  K 3.8 3.8 3.7  CL 109 110 112*  CO2 20* 21* 20*  GLUCOSE 266* 203* 155*  BUN 24* 20 14  CREATININE 1.14* 1.07* 0.85  CALCIUM 9.5 9.2 9.2   Liver Function Tests:  Recent Labs Lab 03/10/16 1356  AST 38  ALT 30  ALKPHOS 40  BILITOT 0.8  PROT 6.6  ALBUMIN 3.6   No results for input(s): LIPASE, AMYLASE in the last 168 hours. No results for input(s): AMMONIA in the last 168 hours. Coagulation Profile:  Recent Labs Lab 03/10/16 1648  INR 1.21   CBC:  Recent Labs Lab 03/10/16 1356 03/11/16 0507  WBC 9.7 7.2  NEUTROABS 6.7  --   HGB 12.1 12.2  HCT 38.5 39.0  MCV 87.7 89.0  PLT 293 272   Cardiac Enzymes:  Recent Labs Lab 03/10/16 1356 03/10/16 1648 03/10/16 2310 03/11/16 0507  CKTOTAL 357*  --   --   --   TROPONINI  --   0.10* 0.09* 0.08*   BNP: Invalid input(s): POCBNP CBG:  Recent Labs Lab 03/12/16 1640 03/12/16 2018 03/12/16 2237 03/13/16 0610 03/13/16 1133  GLUCAP 262* 219* 146* 159* 251*   HbA1C:  Recent Labs  03/11/16 0507  HGBA1C 7.3*   Urine analysis:    Component Value Date/Time   COLORURINE YELLOW 03/10/2016 1500   APPEARANCEUR CLOUDY* 03/10/2016 1500   LABSPEC 1.025 03/10/2016 1500   PHURINE 5.5 03/10/2016 1500   GLUCOSEU 500* 03/10/2016 1500   HGBUR MODERATE* 03/10/2016 1500   BILIRUBINUR NEGATIVE 03/10/2016 1500   KETONESUR 15* 03/10/2016 1500   PROTEINUR 30* 03/10/2016 1500   UROBILINOGEN 0.2 06/16/2015 1134   NITRITE POSITIVE* 03/10/2016 1500   LEUKOCYTESUR MODERATE* 03/10/2016 1500   Sepsis Labs: @LABRCNTIP (procalcitonin:4,lacticidven:4) ) Recent Results (from the past 240 hour(s))  Culture, Urine     Status: Abnormal   Collection Time: 03/10/16  3:00 PM  Result Value Ref Range Status   Specimen Description URINE, CATHETERIZED  Final   Special Requests NONE  Final   Culture >=100,000 COLONIES/mL MORGANELLA MORGANII (A)  Final   Report Status 03/13/2016 FINAL  Final   Organism ID, Bacteria MORGANELLA MORGANII (A)  Final      Susceptibility   Morganella morganii - MIC*    AMPICILLIN >=32 RESISTANT Resistant     CEFAZOLIN >=64 RESISTANT Resistant     CEFTRIAXONE <=1 SENSITIVE Sensitive     CIPROFLOXACIN <=0.25 SENSITIVE Sensitive     GENTAMICIN <=1 SENSITIVE Sensitive     IMIPENEM 4 SENSITIVE Sensitive     NITROFURANTOIN 128 RESISTANT Resistant     TRIMETH/SULFA <=20 SENSITIVE Sensitive     AMPICILLIN/SULBACTAM 16 INTERMEDIATE Intermediate     PIP/TAZO <=4 SENSITIVE Sensitive     * >=100,000 COLONIES/mL MORGANELLA MORGANII     Scheduled Meds: . carvedilol  6.25 mg Oral BID WC  . cefTRIAXone (ROCEPHIN)  IV  1 g Intravenous Q24H  . clopidogrel  75 mg Oral Daily  . fenofibrate  160 mg Oral QHS  . gabapentin  300 mg Oral TID  . insulin aspart  0-15 Units  Subcutaneous TID WC  . insulin aspart  0-5 Units Subcutaneous QHS  . [START ON 03/14/2016] insulin aspart  14 Units Subcutaneous TID WC  . insulin glargine  40 Units Subcutaneous BID  . levothyroxine  100 mcg Oral QAC breakfast  . metoCLOPramide  10 mg Oral QID  . mometasone-formoterol  2 puff Inhalation BID  . montelukast  10 mg Oral QHS  . pantoprazole  40 mg Oral Daily  . polyethylene glycol  17 g Oral QODAY  . rosuvastatin  20 mg Oral Daily  . topiramate  50 mg Oral BID   Continuous Infusions:   Procedures/Studies: Ct Angio Head W Or Wo Contrast  03/11/2016  CLINICAL DATA:  Continued surveillance RIGHT hemisphere infarction. LEFT hemiparesis. EXAM: CT ANGIOGRAPHY HEAD AND NECK TECHNIQUE: Multidetector CT imaging of the head and neck was performed using the standard protocol during bolus administration of intravenous contrast. Multiplanar CT image reconstructions and MIPs were obtained to evaluate the vascular anatomy. Carotid stenosis measurements (when applicable) are obtained utilizing NASCET criteria, using the distal internal carotid diameter as the denominator. CONTRAST:  Isovue 370, 50 mL. COMPARISON:  MRI brain 03/10/2016.  Noncontrast CT head 03/10/2016. FINDINGS: CT HEAD Calvarium and skull base: No fracture or destructive lesion. Mastoids and middle ears are grossly clear. Paranasal sinuses: Imaged portions are clear. Orbits: Negative. Brain: No hemorrhage is seen. Cytotoxic edema is now developing in the areas of restricted diffusion noted on MR. No hemorrhagic transformation. No midline shift. Chronic atrophic change, stable. White matter hypoattenuation favored to represent small vessel disease. CTA NECK Aortic arch: Standard branching. Imaged portion shows no evidence of aneurysm or dissection. No significant stenosis of the major arch vessel origins. Right carotid system: Calcific plaque at the bifurcation extending into the RIGHT ICA. No evidence of dissection, stenosis (50% or  greater) or occlusion. Left carotid system: Calcific plaque at the bifurcation, extending into the LEFT ICA No evidence of dissection, stenosis (50% or greater) or occlusion. Vertebral arteries: LEFT vertebral dominant. RIGHT vertebral diminutive no stenosis in the neck. No evidence of dissection, stenosis (50% or greater) or occlusion. Nonvascular soft tissues: No masses. Spondylosis. No lung apex lesion. Mediastinal lipomatosis reflecting increased body habitus. Previous cervical fusion [id number] appears uncomplicated. CTA HEAD Anterior circulation: No significant cavernous ICA disease. LEFT anterior circulation normal. On the RIGHT, there is an early bifurcation of the M1 MCA. There is severe stenosis of the superior M2 segment corresponding to the areas of acute infarction.  See image 228 series 402. The inferior M2 segment demonstrates mild stenosis at its origin, probably not flow reducing. No aneurysm, or vascular malformation. Posterior circulation: LEFT vertebral dominant. RIGHT vertebral has a diminutive connection to the basilar, but primarily supplies the RIGHT PICA. No significant stenosis, proximal occlusion, aneurysm, or vascular malformation. Venous sinuses: As permitted by contrast timing, patent. Anatomic variants: None of significance. Delayed phase:   No abnormal intracranial enhancement. IMPRESSION: Non stenotic extracranial carotid bifurcation disease, consisting of calcific plaque. Severe stenosis of the RIGHT MCA M2 superior division vessel, corresponding with the observed areas of infarction. Developing nonhemorrhagic cytotoxic edema, RIGHT hemisphere, without significant mass effect. Electronically Signed   By: Staci Righter M.D.   On: 03/11/2016 09:29   Dg Chest 2 View  03/10/2016  CLINICAL DATA:  Left lower chest pain beginning after landing on left side for 6 hours. Weakness. EXAM: CHEST  2 VIEW COMPARISON:  11/10/2015 FINDINGS: Lungs are adequately inflated without focal consolidation or  effusion. Stable cardiomegaly. There are degenerative changes of the spine. Partially visualized fusion hardware over the cervical spine intact. Surgical change of the left AC joint. Prominent overlying soft tissues. IMPRESSION: No active cardiopulmonary disease. Stable cardiomegaly. Electronically Signed   By: Marin Olp M.D.   On: 03/10/2016 17:25   Dg Ribs Unilateral Left  03/11/2016  CLINICAL DATA:  Fall.  Left side rib pain and back pain. EXAM: LEFT RIBS - 2 VIEW COMPARISON:  11/10/2015 FINDINGS: Acute left anterior ninth rib fracture deformity identified. No additional fractures or subluxations. IMPRESSION: 1. Left ninth rib fracture. Electronically Signed   By: Kerby Moors M.D.   On: 03/11/2016 12:54   Dg Thoracic Spine W/swimmers  03/11/2016  CLINICAL DATA:  65 year old female with a history of fall and rib pain EXAM: THORACIC SPINE - 3 VIEWS COMPARISON:  None. FINDINGS: Thoracic Spine: Thoracic vertebral elements maintain normal anatomic alignment, with no evidence of anterolisthesis, retrolisthesis, or subluxation. No acute fracture line identified. Vertebral body heights maintained. Mild degenerative disc disease. Unremarkable appearance of the visualized thorax. Surgical changes of prior anterior cervical discectomy infusion of the lower cervical region. IMPRESSION: No radiographic evidence of acute fracture or malalignment of thoracic spine. No displaced rib fracture identified on this plain film series. Signed, Dulcy Fanny. Earleen Newport, DO [facility] Electronically Signed   By: Corrie Mckusick D.O.   On: 03/11/2016 12:58   Ct Head Wo Contrast  03/11/2016  CLINICAL DATA:  Slurred speech and increase in left-sided weakness EXAM: CT HEAD WITHOUT CONTRAST TECHNIQUE: Contiguous axial images were obtained from the base of the skull through the vertex without intravenous contrast. COMPARISON:  Head CT March 10, 2016; brain MRI March 10, 2016;  contrast-enhanced head CT March 11, 2016 FINDINGS: Brain: The ventricles are normal in size and configuration for age. There is again noted an infarct in the right middle cerebral artery distribution involving portions of the posterior right frontal lobe, much of the superior right temporal lobe, can the right superior temporal -anterior parietal junction. This infarct involves the external capsule, claustrum, extreme capsule, and much of the insular cortex on the right. There is slightly more edema superiorly along the right posterior corona radiata compared to earlier in the day. Elsewhere, there is stable periventricular small vessel disease. No new infarct is evident. There is no mass, hemorrhage, or extra-axial fluid collection. Vascular: Calcification in both cavernous carotid artery regions is stable. Skull: Bony calvarium appears intact. Sinuses/Orbits: Orbits appear symmetric  bilaterally. Paranasal sinuses are clear. Other: Mastoid air cells are clear. IMPRESSION: Right middle cerebral artery distribution infarct again noted with slightly more edema superiorly and posteriorly compared to earlier in the day. No new infarct evident. There is stable periventricular small vessel disease. No hemorrhage. There is no mass effect beyond the cytotoxic edema noted in the area of acute infarct. No extra-axial fluid collection. Electronically Signed   By: Lowella Grip III M.D.   On: 03/11/2016 21:02   Ct Head Wo Contrast  03/10/2016  CLINICAL DATA:  Fall, left-sided weakness. EXAM: CT HEAD WITHOUT CONTRAST CT CERVICAL SPINE WITHOUT CONTRAST TECHNIQUE: Multidetector CT imaging of the head and cervical spine was performed following the standard protocol without intravenous contrast. Multiplanar CT image reconstructions of the cervical spine were also generated. COMPARISON:  MRI brain 06/10/2006 FINDINGS: CT HEAD FINDINGS There is low-density throughout the insular cortex and adjacent temporal lobe and frontal lobe  on the right compatible with acute infarct. No hemorrhage. No hydrocephalus or midline shift. No acute calvarial abnormality. CT CERVICAL SPINE FINDINGS Prior anterior fusion from C5-C7. Degenerative changes at see C4-5 and C7-T1. No fracture. No malalignment. Prevertebral soft tissues are normal. No epidural or paraspinal hematoma. IMPRESSION: Acute right MCA infarct involving the insular cortex and adjacent right frontal and temporal lobes. No hemorrhage. No acute bony abnormality in the cervical spine. Critical Value/emergent results were called by telephone at the time of interpretation on 03/10/2016 at 2:04 pm to Dr. Theotis Burrow , who verbally acknowledged these results. Electronically Signed   By: Rolm Baptise M.D.   On: 03/10/2016 14:04   Ct Angio Neck W Or Wo Contrast  03/11/2016  CLINICAL DATA:  Continued surveillance RIGHT hemisphere infarction. LEFT hemiparesis. EXAM: CT ANGIOGRAPHY HEAD AND NECK TECHNIQUE: Multidetector CT imaging of the head and neck was performed using the standard protocol during bolus administration of intravenous contrast. Multiplanar CT image reconstructions and MIPs were obtained to evaluate the vascular anatomy. Carotid stenosis measurements (when applicable) are obtained utilizing NASCET criteria, using the distal internal carotid diameter as the denominator. CONTRAST:  Isovue 370, 50 mL. COMPARISON:  MRI brain 03/10/2016.  Noncontrast CT head 03/10/2016. FINDINGS: CT HEAD Calvarium and skull base: No fracture or destructive lesion. Mastoids and middle ears are grossly clear. Paranasal sinuses: Imaged portions are clear. Orbits: Negative. Brain: No hemorrhage is seen. Cytotoxic edema is now developing in the areas of restricted diffusion noted on MR. No hemorrhagic transformation. No midline shift. Chronic atrophic change, stable. White matter hypoattenuation favored to represent small vessel disease. CTA NECK Aortic arch: Standard branching. Imaged portion shows no evidence  of aneurysm or dissection. No significant stenosis of the major arch vessel origins. Right carotid system: Calcific plaque at the bifurcation extending into the RIGHT ICA. No evidence of dissection, stenosis (50% or greater) or occlusion. Left carotid system: Calcific plaque at the bifurcation, extending into the LEFT ICA No evidence of dissection, stenosis (50% or greater) or occlusion. Vertebral arteries: LEFT vertebral dominant. RIGHT vertebral diminutive no stenosis in the neck. No evidence of dissection, stenosis (50% or greater) or occlusion. Nonvascular soft tissues: No masses. Spondylosis. No lung apex lesion. Mediastinal lipomatosis reflecting increased body habitus. Previous cervical fusion Q000111Q appears uncomplicated. CTA HEAD Anterior circulation: No significant cavernous ICA disease. LEFT anterior circulation normal. On the RIGHT, there is an early bifurcation of the M1 MCA. There is severe stenosis of the superior M2 segment corresponding to the areas of acute infarction. See image 228 series 402. The  inferior M2 segment demonstrates mild stenosis at its origin, probably not flow reducing. No aneurysm, or vascular malformation. Posterior circulation: LEFT vertebral dominant. RIGHT vertebral has a diminutive connection to the basilar, but primarily supplies the RIGHT PICA. No significant stenosis, proximal occlusion, aneurysm, or vascular malformation. Venous sinuses: As permitted by contrast timing, patent. Anatomic variants: None of significance. Delayed phase:   No abnormal intracranial enhancement. IMPRESSION: Non stenotic extracranial carotid bifurcation disease, consisting of calcific plaque. Severe stenosis of the RIGHT MCA M2 superior division vessel, corresponding with the observed areas of infarction. Developing nonhemorrhagic cytotoxic edema, RIGHT hemisphere, without significant mass effect. Electronically Signed   By: Staci Righter M.D.   On: 03/11/2016 09:29   Ct Cervical Spine Wo  Contrast  03/10/2016  CLINICAL DATA:  Fall, left-sided weakness. EXAM: CT HEAD WITHOUT CONTRAST CT CERVICAL SPINE WITHOUT CONTRAST TECHNIQUE: Multidetector CT imaging of the head and cervical spine was performed following the standard protocol without intravenous contrast. Multiplanar CT image reconstructions of the cervical spine were also generated. COMPARISON:  MRI brain 06/10/2006 FINDINGS: CT HEAD FINDINGS There is low-density throughout the insular cortex and adjacent temporal lobe and frontal lobe on the right compatible with acute infarct. No hemorrhage. No hydrocephalus or midline shift. No acute calvarial abnormality. CT CERVICAL SPINE FINDINGS Prior anterior fusion from C5-C7. Degenerative changes at see C4-5 and C7-T1. No fracture. No malalignment. Prevertebral soft tissues are normal. No epidural or paraspinal hematoma. IMPRESSION: Acute right MCA infarct involving the insular cortex and adjacent right frontal and temporal lobes. No hemorrhage. No acute bony abnormality in the cervical spine. Critical Value/emergent results were called by telephone at the time of interpretation on 03/10/2016 at 2:04 pm to Dr. Theotis Burrow , who verbally acknowledged these results. Electronically Signed   By: Rolm Baptise M.D.   On: 03/10/2016 14:04   Mr Brain Wo Contrast  03/10/2016  CLINICAL DATA:  Left-sided weakness for at least 2 days. Stroke risk factors include diabetes, hypertension, dyslipidemia, and previous stroke. EXAM: MRI HEAD WITHOUT CONTRAST TECHNIQUE: Multiplanar, multiecho pulse sequences of the brain and surrounding structures were obtained without intravenous contrast. COMPARISON:  CT head performed earlier today. FINDINGS: Extensive areas of both focal and confluent restricted diffusion throughout the RIGHT hemisphere involving the frontal, temporal, insular, and parietal regions, largely RIGHT MCA territory, consistent with acute infarction. No visible hemorrhage, mass lesion, hydrocephalus, or  extra-axial fluid. Premature for age cerebral and cerebellar atrophy. Mild subcortical and periventricular T2 and FLAIR hyperintensities, likely chronic microvascular ischemic change. Remote LEFT insular cortex infarct. Old LEFT cerebellar infarct. No chronic hemorrhage. Flow voids are maintained in the internal carotid arteries, basilar artery, and LEFT vertebral artery. RIGHT vertebral is diminutive. There is poor flow related enhancement in the RIGHT M1 MCA, representing either long segment stenosis or occlusion. No significant midline shift. Extracranial soft tissues unremarkable.  No midline abnormality. IMPRESSION: Extensive areas of both focal and confluent restricted diffusion throughout the RIGHT MCA distribution representing acute nonhemorrhagic infarction. Suspected RIGHT M1 MCA long segment stenosis or occlusion. Atrophy with small vessel disease. Areas of chronic infarction, most notable LEFT insula and LEFT cerebellum. Electronically Signed   By: Staci Righter M.D.   On: 03/10/2016 19:25   Dg Op Swallowing Func-medicare/speech Path  02/13/2016  CLINICAL DATA:  Gastric paresis with choking episodes. EXAM: MODIFIED BARIUM SWALLOW TECHNIQUE: Different consistencies of barium were administered orally to the patient by the Speech Pathologist. Imaging of the pharynx was performed in the lateral projection. FLUOROSCOPY TIME:  Radiation Exposure Index (as provided by the fluoroscopic device): If the device does not provide the exposure index: Fluoroscopy Time:  0 minutes 34 seconds. Number of Acquired Images:  None. COMPARISON:  None. FINDINGS: Thin liquid- flash penetration.  No aspiration. Nectar thick liquid- not assessed. Honey- not assessed. Pure- within normal limits. Cracker-within normal limits. Pure with cracker- not assessed. Barium tablet -  within normal limits. IMPRESSION: Flash penetration with thin liquids.  Otherwise normal exam. Please refer to the Speech Pathologists report for complete  details and recommendations. Electronically Signed   By: Lorin Picket M.D.   On: 02/13/2016 13:58   Mr Brain Ltd W/o Cm  03/12/2016  CLINICAL DATA:  65 year old female with abrupt onset left side weakness beginning overnight July 1st-2nd, slid out of bed and was found down by her daughter 6 hours later. Right MCA infarct. Initial encounter. EXAM: MRI HEAD WITHOUT CONTRAST TECHNIQUE: Multiplanar, multiecho pulse sequences of the brain and surrounding structures were obtained without intravenous contrast. COMPARISON:  Brain MRI 03/10/2016 and other recent studies. FINDINGS: The examination had to be discontinued prior to completion due to patient condition ; refusal to complete. Today images are also mildly degraded by motion despite repeated imaging attempts. Major intracranial vascular flow voids are stable. Increased volume of restricted diffusion in the right MCA territory, centered on the insula and operculum as before. The right basal ganglia remain spared. More of the posterior right temporal lobe and lateral right peri-Rolandic cortex is affected now. Associated increased T2 and FLAIR signal in a cytotoxic edema pattern. Mildly increased mass effect on the right lateral ventricle, but no evidence of associated hemorrhage. No midline shift. No ventriculomegaly. No contralateral or posterior fossa restricted diffusion. Stable gray and white matter signal outside of the acutely affected parenchyma. Small chronic left cerebellar infarcts again noted. Small chronic anterior left insula infarct again noted. Occasional other small chronic cortically based infarcts in both hemispheres were better demonstrated on the prior MRI due to motion today. Visualized paranasal sinuses and mastoids are stable and well pneumatized. Stable orbit and scalp soft tissues. IMPRESSION: 1. Mild extension of the acute right MCA infarct since 03/10/2016. Increased cytotoxic edema, but no associated hemorrhage or significant  intracranial mass effect at this time. 2. No new intracranial abnormality. Electronically Signed   By: Genevie Ann M.D.   On: 03/12/2016 16:29    Toa Mia, DO  [facility] Pager [phone]  If 7PM-7AM, please contact night-coverage [email or web address] Password [email or web address] 03/13/2016, 5:11 PM   LOS: 3 days

## 2016-03-14 ENCOUNTER — Encounter (HOSPITAL_COMMUNITY): Payer: Self-pay | Admitting: Neurology

## 2016-03-14 ENCOUNTER — Inpatient Hospital Stay (HOSPITAL_COMMUNITY)
Admission: RE | Admit: 2016-03-14 | Discharge: 2016-04-02 | DRG: 057 | Disposition: A | Discharge status: Skilled Nursing Facility | Payer: Medicare Other | Source: Intra-hospital | Attending: Physical Medicine & Rehabilitation | Admitting: Physical Medicine & Rehabilitation

## 2016-03-14 DIAGNOSIS — I63511 Cerebral infarction due to unspecified occlusion or stenosis of right middle cerebral artery: Secondary | ICD-10-CM | POA: Diagnosis not present

## 2016-03-14 DIAGNOSIS — E1142 Type 2 diabetes mellitus with diabetic polyneuropathy: Secondary | ICD-10-CM | POA: Diagnosis present

## 2016-03-14 DIAGNOSIS — N390 Urinary tract infection, site not specified: Secondary | ICD-10-CM

## 2016-03-14 DIAGNOSIS — Z880 Allergy status to penicillin: Secondary | ICD-10-CM | POA: Diagnosis not present

## 2016-03-14 DIAGNOSIS — Z886 Allergy status to analgesic agent status: Secondary | ICD-10-CM

## 2016-03-14 DIAGNOSIS — K219 Gastro-esophageal reflux disease without esophagitis: Secondary | ICD-10-CM | POA: Diagnosis present

## 2016-03-14 DIAGNOSIS — I252 Old myocardial infarction: Secondary | ICD-10-CM | POA: Diagnosis not present

## 2016-03-14 DIAGNOSIS — E1340 Other specified diabetes mellitus with diabetic neuropathy, unspecified: Secondary | ICD-10-CM

## 2016-03-14 DIAGNOSIS — E662 Morbid (severe) obesity with alveolar hypoventilation: Secondary | ICD-10-CM | POA: Diagnosis present

## 2016-03-14 DIAGNOSIS — J45909 Unspecified asthma, uncomplicated: Secondary | ICD-10-CM

## 2016-03-14 DIAGNOSIS — Z888 Allergy status to other drugs, medicaments and biological substances status: Secondary | ICD-10-CM

## 2016-03-14 DIAGNOSIS — Z7951 Long term (current) use of inhaled steroids: Secondary | ICD-10-CM | POA: Diagnosis not present

## 2016-03-14 DIAGNOSIS — M21372 Foot drop, left foot: Secondary | ICD-10-CM | POA: Diagnosis present

## 2016-03-14 DIAGNOSIS — K5900 Constipation, unspecified: Secondary | ICD-10-CM | POA: Diagnosis present

## 2016-03-14 DIAGNOSIS — F329 Major depressive disorder, single episode, unspecified: Secondary | ICD-10-CM | POA: Diagnosis present

## 2016-03-14 DIAGNOSIS — G441 Vascular headache, not elsewhere classified: Secondary | ICD-10-CM | POA: Diagnosis not present

## 2016-03-14 DIAGNOSIS — Z6841 Body Mass Index (BMI) 40.0 and over, adult: Secondary | ICD-10-CM

## 2016-03-14 DIAGNOSIS — E038 Other specified hypothyroidism: Secondary | ICD-10-CM

## 2016-03-14 DIAGNOSIS — W06XXXD Fall from bed, subsequent encounter: Secondary | ICD-10-CM | POA: Diagnosis present

## 2016-03-14 DIAGNOSIS — Z981 Arthrodesis status: Secondary | ICD-10-CM | POA: Diagnosis not present

## 2016-03-14 DIAGNOSIS — Z79899 Other long term (current) drug therapy: Secondary | ICD-10-CM | POA: Diagnosis not present

## 2016-03-14 DIAGNOSIS — I10 Essential (primary) hypertension: Secondary | ICD-10-CM | POA: Diagnosis present

## 2016-03-14 DIAGNOSIS — E785 Hyperlipidemia, unspecified: Secondary | ICD-10-CM | POA: Diagnosis present

## 2016-03-14 DIAGNOSIS — M545 Low back pain: Secondary | ICD-10-CM

## 2016-03-14 DIAGNOSIS — Z7982 Long term (current) use of aspirin: Secondary | ICD-10-CM | POA: Diagnosis not present

## 2016-03-14 DIAGNOSIS — M810 Age-related osteoporosis without current pathological fracture: Secondary | ICD-10-CM | POA: Diagnosis present

## 2016-03-14 DIAGNOSIS — M7071 Other bursitis of hip, right hip: Secondary | ICD-10-CM | POA: Diagnosis present

## 2016-03-14 DIAGNOSIS — M7711 Lateral epicondylitis, right elbow: Secondary | ICD-10-CM | POA: Diagnosis not present

## 2016-03-14 DIAGNOSIS — Z794 Long term (current) use of insulin: Secondary | ICD-10-CM | POA: Diagnosis not present

## 2016-03-14 DIAGNOSIS — E133399 Other specified diabetes mellitus with moderate nonproliferative diabetic retinopathy without macular edema, unspecified eye: Secondary | ICD-10-CM

## 2016-03-14 DIAGNOSIS — I69354 Hemiplegia and hemiparesis following cerebral infarction affecting left non-dominant side: Principal | ICD-10-CM

## 2016-03-14 DIAGNOSIS — G8194 Hemiplegia, unspecified affecting left nondominant side: Secondary | ICD-10-CM | POA: Diagnosis not present

## 2016-03-14 DIAGNOSIS — E039 Hypothyroidism, unspecified: Secondary | ICD-10-CM | POA: Diagnosis present

## 2016-03-14 LAB — GLUCOSE, CAPILLARY
GLUCOSE-CAPILLARY: 221 mg/dL — AB (ref 65–99)
GLUCOSE-CAPILLARY: 173 mg/dL — AB (ref 65–99)
GLUCOSE-CAPILLARY: 247 mg/dL — AB (ref 65–99)
Glucose-Capillary: 293 mg/dL — ABNORMAL HIGH (ref 65–99)
Glucose-Capillary: 214 mg/dL — ABNORMAL HIGH (ref 65–99)
Glucose-Capillary: 184 mg/dL — ABNORMAL HIGH (ref 65–99)

## 2016-03-14 MED ORDER — MONTELUKAST SODIUM 10 MG PO TABS
10.0000 mg | ORAL_TABLET | Freq: Every day | ORAL | Status: DC
  Administered 2016-03-14 – 2016-04-01 (×18): 10 mg via ORAL
  Filled 2016-03-14 (×20): qty 1

## 2016-03-14 MED ORDER — METOCLOPRAMIDE HCL 10 MG PO TABS
10.0000 mg | ORAL_TABLET | Freq: Four times a day (QID) | ORAL | Status: DC
  Administered 2016-03-14 – 2016-04-02 (×73): 10 mg via ORAL
  Filled 2016-03-14 (×8): qty 1
  Filled 2016-03-14: qty 2
  Filled 2016-03-14 (×8): qty 1
  Filled 2016-03-14: qty 2
  Filled 2016-03-14 (×2): qty 1
  Filled 2016-03-14: qty 2
  Filled 2016-03-14 (×8): qty 1
  Filled 2016-03-14: qty 2
  Filled 2016-03-14 (×5): qty 1
  Filled 2016-03-14: qty 2
  Filled 2016-03-14 (×8): qty 1
  Filled 2016-03-14 (×2): qty 2
  Filled 2016-03-14 (×29): qty 1

## 2016-03-14 MED ORDER — INSULIN GLARGINE 100 UNIT/ML ~~LOC~~ SOLN
45.0000 [IU] | Freq: Two times a day (BID) | SUBCUTANEOUS | Status: DC
  Administered 2016-03-14 – 2016-03-17 (×7): 45 [IU] via SUBCUTANEOUS
  Filled 2016-03-14 (×9): qty 0.45

## 2016-03-14 MED ORDER — ACETAMINOPHEN 650 MG RE SUPP: 650.0000 mg | RECTAL | Status: DC | PRN

## 2016-03-14 MED ORDER — INSULIN ASPART 100 UNIT/ML ~~LOC~~ SOLN
15.0000 [IU] | Freq: Three times a day (TID) | SUBCUTANEOUS | Status: DC
  Administered 2016-03-15 (×3): 15 [IU] via SUBCUTANEOUS

## 2016-03-14 MED ORDER — TOPIRAMATE 25 MG PO TABS
50.0000 mg | ORAL_TABLET | Freq: Two times a day (BID) | ORAL | Status: DC
  Administered 2016-03-14 – 2016-04-02 (×38): 50 mg via ORAL
  Filled 2016-03-14 (×38): qty 2

## 2016-03-14 MED ORDER — ACETAMINOPHEN 325 MG PO TABS
650.0000 mg | ORAL_TABLET | ORAL | Status: DC | PRN
  Administered 2016-03-15 – 2016-03-31 (×2): 650 mg via ORAL
  Filled 2016-03-14 (×5): qty 2

## 2016-03-14 MED ORDER — BISACODYL 10 MG RE SUPP
10.0000 mg | Freq: Once | RECTAL | Status: AC
  Administered 2016-03-14: 10 mg via RECTAL
  Filled 2016-03-14: qty 1

## 2016-03-14 MED ORDER — ONDANSETRON HCL 4 MG PO TABS
4.0000 mg | ORAL_TABLET | Freq: Four times a day (QID) | ORAL | Status: DC | PRN
  Administered 2016-03-29: 4 mg via ORAL
  Filled 2016-03-14: qty 1

## 2016-03-14 MED ORDER — INSULIN GLARGINE 100 UNIT/ML ~~LOC~~ SOLN
50.0000 [IU] | Freq: Two times a day (BID) | SUBCUTANEOUS | Status: DC
  Filled 2016-03-14: qty 0.5

## 2016-03-14 MED ORDER — CARVEDILOL 6.25 MG PO TABS
6.2500 mg | ORAL_TABLET | Freq: Two times a day (BID) | ORAL | Status: DC
  Administered 2016-03-15 – 2016-04-02 (×36): 6.25 mg via ORAL
  Filled 2016-03-14 (×37): qty 1

## 2016-03-14 MED ORDER — FENOFIBRATE 160 MG PO TABS
160.0000 mg | ORAL_TABLET | Freq: Every day | ORAL | Status: DC
Start: 2016-03-14 — End: 2016-04-02
  Administered 2016-03-14 – 2016-04-01 (×18): 160 mg via ORAL
  Filled 2016-03-14 (×19): qty 1

## 2016-03-14 MED ORDER — MOMETASONE FURO-FORMOTEROL FUM 200-5 MCG/ACT IN AERO
2.0000 | INHALATION_SPRAY | Freq: Two times a day (BID) | RESPIRATORY_TRACT | Status: DC
  Administered 2016-03-14 – 2016-03-28 (×22): 2 via RESPIRATORY_TRACT
  Filled 2016-03-14: qty 8.8

## 2016-03-14 MED ORDER — POLYETHYLENE GLYCOL 3350 17 G PO PACK
17.0000 g | PACK | ORAL | Status: DC
  Administered 2016-03-15 – 2016-04-02 (×8): 17 g via ORAL
  Filled 2016-03-14 (×10): qty 1

## 2016-03-14 MED ORDER — INSULIN ASPART 100 UNIT/ML ~~LOC~~ SOLN
15.0000 [IU] | Freq: Three times a day (TID) | SUBCUTANEOUS | Status: DC
  Administered 2016-03-14: 15 [IU] via SUBCUTANEOUS

## 2016-03-14 MED ORDER — INSULIN GLARGINE 100 UNIT/ML ~~LOC~~ SOLN
45.0000 [IU] | Freq: Two times a day (BID) | SUBCUTANEOUS | Status: DC
  Filled 2016-03-14: qty 0.45

## 2016-03-14 MED ORDER — CLOPIDOGREL BISULFATE 75 MG PO TABS
75.0000 mg | ORAL_TABLET | Freq: Every day | ORAL | Status: DC
  Administered 2016-03-15 – 2016-04-02 (×19): 75 mg via ORAL
  Filled 2016-03-14 (×19): qty 1

## 2016-03-14 MED ORDER — HYDROCODONE-ACETAMINOPHEN 5-325 MG PO TABS
1.0000 | ORAL_TABLET | Freq: Three times a day (TID) | ORAL | Status: DC | PRN
  Administered 2016-03-14 – 2016-03-21 (×15): 1 via ORAL
  Filled 2016-03-14 (×18): qty 1

## 2016-03-14 MED ORDER — INSULIN ASPART 100 UNIT/ML ~~LOC~~ SOLN
0.0000 [IU] | Freq: Three times a day (TID) | SUBCUTANEOUS | Status: DC
  Administered 2016-03-15: 5 [IU] via SUBCUTANEOUS
  Administered 2016-03-15 (×2): 8 [IU] via SUBCUTANEOUS
  Administered 2016-03-16 (×2): 5 [IU] via SUBCUTANEOUS
  Administered 2016-03-16: 2 [IU] via SUBCUTANEOUS
  Administered 2016-03-17: 3 [IU] via SUBCUTANEOUS
  Administered 2016-03-17: 5 [IU] via SUBCUTANEOUS
  Administered 2016-03-17 – 2016-03-18 (×3): 3 [IU] via SUBCUTANEOUS
  Administered 2016-03-18: 5 [IU] via SUBCUTANEOUS
  Administered 2016-03-19: 2 [IU] via SUBCUTANEOUS
  Administered 2016-03-19 – 2016-03-20 (×5): 3 [IU] via SUBCUTANEOUS
  Administered 2016-03-21: 5 [IU] via SUBCUTANEOUS
  Administered 2016-03-21: 3 [IU] via SUBCUTANEOUS
  Administered 2016-03-21: 2 [IU] via SUBCUTANEOUS
  Administered 2016-03-22 (×2): 5 [IU] via SUBCUTANEOUS
  Administered 2016-03-22: 2 [IU] via SUBCUTANEOUS
  Administered 2016-03-23: 3 [IU] via SUBCUTANEOUS
  Administered 2016-03-23: 5 [IU] via SUBCUTANEOUS
  Administered 2016-03-23: 2 [IU] via SUBCUTANEOUS
  Administered 2016-03-24 (×2): 3 [IU] via SUBCUTANEOUS
  Administered 2016-03-24: 5 [IU] via SUBCUTANEOUS
  Administered 2016-03-25 – 2016-03-26 (×4): 3 [IU] via SUBCUTANEOUS
  Administered 2016-03-26: 5 [IU] via SUBCUTANEOUS
  Administered 2016-03-27: 2 [IU] via SUBCUTANEOUS
  Administered 2016-03-27: 3 [IU] via SUBCUTANEOUS
  Administered 2016-03-27: 2 [IU] via SUBCUTANEOUS
  Administered 2016-03-28: 3 [IU] via SUBCUTANEOUS
  Administered 2016-03-28: 2 [IU] via SUBCUTANEOUS
  Administered 2016-03-29 – 2016-03-30 (×3): 3 [IU] via SUBCUTANEOUS
  Administered 2016-03-30: 2 [IU] via SUBCUTANEOUS
  Administered 2016-03-30 – 2016-03-31 (×2): 3 [IU] via SUBCUTANEOUS
  Administered 2016-04-01: 5 [IU] via SUBCUTANEOUS
  Administered 2016-04-01: 2 [IU] via SUBCUTANEOUS

## 2016-03-14 MED ORDER — PANTOPRAZOLE SODIUM 40 MG PO TBEC
40.0000 mg | DELAYED_RELEASE_TABLET | Freq: Every day | ORAL | Status: DC
  Administered 2016-03-15 – 2016-04-02 (×19): 40 mg via ORAL
  Filled 2016-03-14 (×19): qty 1

## 2016-03-14 MED ORDER — LEVOTHYROXINE SODIUM 100 MCG PO TABS
100.0000 ug | ORAL_TABLET | Freq: Every day | ORAL | Status: DC
  Administered 2016-03-15 – 2016-04-02 (×19): 100 ug via ORAL
  Filled 2016-03-14 (×19): qty 1

## 2016-03-14 MED ORDER — ROSUVASTATIN CALCIUM 10 MG PO TABS
20.0000 mg | ORAL_TABLET | Freq: Every day | ORAL | Status: DC
  Administered 2016-03-15 – 2016-04-02 (×19): 20 mg via ORAL
  Filled 2016-03-14 (×19): qty 2

## 2016-03-14 MED ORDER — DOCUSATE SODIUM 100 MG PO CAPS
100.0000 mg | ORAL_CAPSULE | Freq: Two times a day (BID) | ORAL | Status: DC
  Administered 2016-03-14 – 2016-04-02 (×38): 100 mg via ORAL
  Filled 2016-03-14 (×42): qty 1

## 2016-03-14 MED ORDER — INSULIN GLARGINE 100 UNIT/ML ~~LOC~~ SOLN: 45.0000 [IU] | Freq: Two times a day (BID) | SUBCUTANEOUS | Status: DC

## 2016-03-14 MED ORDER — ONDANSETRON HCL 4 MG/2ML IJ SOLN: 4.0000 mg | Freq: Four times a day (QID) | INTRAMUSCULAR | Status: DC | PRN

## 2016-03-14 MED ORDER — ALBUTEROL SULFATE (2.5 MG/3ML) 0.083% IN NEBU: 2.5000 mg | INHALATION_SOLUTION | Freq: Four times a day (QID) | RESPIRATORY_TRACT | Status: DC | PRN

## 2016-03-14 MED ORDER — INSULIN ASPART 100 UNIT/ML ~~LOC~~ SOLN: 35.0000 [IU] | Freq: Once | SUBCUTANEOUS | Status: DC

## 2016-03-14 MED ORDER — CLOPIDOGREL BISULFATE 75 MG PO TABS: 75.0000 mg | ORAL_TABLET | Freq: Every day | ORAL | Status: DC

## 2016-03-14 MED ORDER — CEFPODOXIME PROXETIL 200 MG PO TABS
200.0000 mg | ORAL_TABLET | Freq: Two times a day (BID) | ORAL | Status: AC
  Administered 2016-03-14: 200 mg via ORAL
  Filled 2016-03-14: qty 1

## 2016-03-14 MED ORDER — SORBITOL 70 % SOLN
30.0000 mL | Freq: Every day | Status: DC | PRN
  Filled 2016-03-14: qty 30

## 2016-03-14 MED ORDER — NITROGLYCERIN 0.4 MG SL SUBL: 0.4000 mg | SUBLINGUAL_TABLET | SUBLINGUAL | Status: DC | PRN

## 2016-03-14 MED ORDER — INSULIN ASPART 100 UNIT/ML ~~LOC~~ SOLN: 30.0000 [IU] | Freq: Once | SUBCUTANEOUS | Status: DC

## 2016-03-14 MED ORDER — GABAPENTIN 300 MG PO CAPS
300.0000 mg | ORAL_CAPSULE | Freq: Three times a day (TID) | ORAL | Status: DC
  Administered 2016-03-14 – 2016-04-02 (×55): 300 mg via ORAL
  Filled 2016-03-14 (×56): qty 1

## 2016-03-14 MED ORDER — CEFPODOXIME PROXETIL 200 MG PO TABS
200.0000 mg | ORAL_TABLET | Freq: Two times a day (BID) | ORAL | Status: DC
  Filled 2016-03-14: qty 1

## 2016-03-14 MED ORDER — DOCUSATE SODIUM 100 MG PO CAPS
100.0000 mg | ORAL_CAPSULE | Freq: Two times a day (BID) | ORAL | Status: DC
  Administered 2016-03-14: 100 mg via ORAL
  Filled 2016-03-14: qty 1

## 2016-03-14 NOTE — H&P (View-Only) (Signed)
Physical Medicine and Rehabilitation Admission H&P    Chief Complaint  Patient presents with  . Weakness  : HPI: Danielle Vargas is a 65 y.o. right handed female with history of hypertension, obesity,Diabetes mellitus, hyperlipidemia and left rotator cuff repair. Per chart review patient lives alone and was independent prior to admission and active. One level apartment. She has a daughter in the area that works. Presented 03/10/2016 with left-sided weakness after a fall from bed. She denied any loss of consciousness MRI of the brain showed extensive areas of both focal and confluent restricted diffusion throughout the right MCA distribution. CTA suspect right M1-MCA long segment stenosis or occlusion. 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 Patient did not receive TPA. Echocardiogram completed with limited study. The left ventricle appeared normal in overall size and function.On the afternoon of 03/11/2016 with increased increased right-sided weakness and slurred speech. Follow-up CT of the head showed no evidence of new infarct . MRI showed mild extension of acute right MCA infarct as compared to 03/10/2016. Neurology services recommended to continue Plavix. TEE 03/12/2016 showing no cardiac source of embolism. LVH LAD dilation present as well as placement of loop recorder.  Neurology consulted currently on Plavix for CVA prophylaxis. Morganella UTI completing 7 day course of Vantin initiated 03/13/2016. Occupational therapy evaluation completed 03/11/2016 with recommendations of physical medicine rehabilitation consult. Patient was admitted for comprehensive rehabilitation program  ROS Constitutional: Negative for fever and chills.  Eyes: Negative for blurred vision and double vision.  Respiratory: Negative for cough and shortness of breath.  Cardiovascular: Negative for chest pain, palpitations and leg swelling.  Gastrointestinal: Positive for constipation.  Negative for nausea and vomiting.   GERD  Musculoskeletal: Positive for myalgias.  Skin: Negative for rash.  Neurological: Positive for speech change, focal weakness, weakness and headaches. Negative for sensory change, seizures and loss of consciousness.  Psychiatric/Behavioral: Positive for depression.  All other systems reviewed and are negative   Past Medical History  Diagnosis Date  . DM (diabetes mellitus) ([place])   . Obesity   . Dyslipidemia   . HTN (hypertension)   . Hypothyroidism   . Gastroparesis   . Migraine   . Osteoporosis   . Colon polyps 7/07    hyperplastic and adenomatous  . Nephrolithiasis   . Diverticulosis   . External hemorrhoid   . Fatty liver   . Asthma   . CAD (coronary artery disease)     a. 12/2008 Cath: mild irregs throughout, EF 75%.  . 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 Allergy   . Cataract     "just beginning"  . Myocardial infarction ([place])     FEb 2015  . Depression     pt unsure, psychologist said no  . Sleep apnea     wears CPAP  . Hyperlipidemia    Past Surgical History  Procedure Laterality Date  . Tubal ligation  '85  . Breast reduction surgery  '88  . Cervical disc surgery  '98    fusion  . Carpal tunnel release  2000    right wrist  . Rotator cuff repair  '09    left shoulder  . Ulnar nerve repair  '02, '08    left done, then right  . Cesarean section  '79  '84  . Left heart catheterization with coronary angiogram N/A 10/26/2013    Procedure: LEFT HEART CATHETERIZATION WITH CORONARY ANGIOGRAM;  Surgeon: Sinclair Grooms, MD;  Location: [facility]  CATH LAB;  Service: Cardiovascular;  Laterality: N/A;  . Colonoscopy    . Ep implantable device N/A 03/12/2016    Procedure: Loop Recorder Insertion;  Surgeon: Will Meredith Leeds, MD;  Location: [place] CV LAB;  Service: Cardiovascular;  Laterality: N/A;  . Tee without cardioversion N/A 03/12/2016    Procedure: TRANSESOPHAGEAL ECHOCARDIOGRAM (TEE);  Surgeon: Sanda Klein,  MD;  Location: [facility] ENDOSCOPY;  Service: Cardiovascular;  Laterality: N/A;   Family History  Problem Relation Age of Onset  . COPD Father   . Stroke Father   . Emphysema Father   . Heart failure Brother   . Heart failure Mother   . Heart attack Mother     several  . Diabetes Mother   . Kidney disease Mother   . Hypertension Brother   . Colon cancer  45    mat. 1st cousin  . Other      celiac sprue, 1/2 brother  . Diabetes Brother     x 3  . Lung cancer Brother   . Cirrhosis Sister     liver transplant  . Liver disease Sister     transplant  . Heart attack Paternal Grandfather   . Heart attack Brother   . Esophageal cancer Neg Hx   . Stomach cancer Neg Hx   . Rectal cancer Neg Hx   . Colon cancer Paternal Aunt 80   Social History:  reports that she has never smoked. She has never used smokeless tobacco. She reports that she does not drink alcohol or use illicit drugs. Allergies:  Allergies  Allergen Reactions  . Invokana [Canagliflozin] Diarrhea  . Nsaids Other (See Comments)    GI problems/ stomach pain  . Penicillins Rash    Has patient had a PCN reaction causing immediate rash, facial/tongue/throat swelling, SOB or lightheadedness with hypotension: No (rash on legs after 3 days [place] -June 2016) Has patient had a PCN reaction causing severe rash involving mucus membranes or skin necrosis: No Has patient had a PCN reaction that required hospitalization No Has patient had a PCN reaction occurring within the last 10 years: Yes If all of the above answers are "NO", then may proceed with Cephalosporin use.   Medications Prior to Admission  Medication Sig Dispense Refill  . albuterol (PROVENTIL HFA;VENTOLIN HFA) 108 (90 BASE) MCG/ACT inhaler Inhale 2 puffs into the lungs every 6 (six) hours as needed for wheezing or shortness of breath.     Marland Kitchen amLODipine (NORVASC) 5 MG tablet Take 5 mg by mouth daily.    Marland Kitchen aspirin EC 81 MG tablet Take 81 mg by mouth daily.     .  budesonide-formoterol (SYMBICORT) 160-4.5 MCG/ACT inhaler Inhale 2 puffs into the lungs 2 (two) times daily as needed (shortness of breath).    . calcium citrate-vitamin D (CITRACAL+D) 315-200 MG-UNIT tablet Take 1 tablet by mouth daily.    . carvedilol (COREG) 12.5 MG tablet Take 1 tablet (12.5 mg total) by mouth 2 (two) times daily with a meal. 180 tablet 0  . Coenzyme Q10 (CO Q 10) 100 MG CAPS Take 100 mg by mouth 2 (two) times daily.     . CRESTOR 20 MG tablet TAKE 1 TABLET EVERY DAY (Patient taking differently: TAKE 1 TABLET EVERY DAY AT BEDTIME) 90 tablet 1  . fenofibrate 160 MG tablet Take 160 mg by mouth at bedtime.     . furosemide (LASIX) 20 MG tablet Take 1 tablet (20 mg total) by mouth every morning. (Patient taking  differently: Take 20 mg by mouth daily. ) 90 tablet 3  . gabapentin (NEURONTIN) 300 MG capsule Take 300 mg by mouth 3 (three) times daily.    Marland Kitchen HYDROcodone-acetaminophen (NORCO) 10-325 MG tablet Take 1 tablet by mouth 3 (three) times daily as needed (pain).   0  . insulin glargine (LANTUS) 100 UNIT/ML injection Inject into the skin 45 units in the morning and 45 units at bedtime. 90 mL 1  . insulin regular (HUMULIN R) 100 units/mL injection Inject 0.35-0.45 mLs (35-45 Units total) into the skin 3 (three) times daily before meals. (Patient taking differently: Inject 45-50 Units into the skin 3 (three) times daily before meals. ) 140 mL 1  . Insulin Syringe-Needle U-100 (INSULIN SYRINGE 1CC/31GX5/16") 31G X 5/16" 1 ML MISC Use to inject insulin 5 times daily. 500 each 3  . levothyroxine (SYNTHROID, LEVOTHROID) 75 MCG tablet Take 75 mcg by mouth daily.     Marland Kitchen losartan (COZAAR) 50 MG tablet Take 50 mg by mouth 2 (two) times daily.     . metFORMIN (GLUCOPHAGE) 1000 MG tablet Take 1,000 mg by mouth 2 (two) times daily with a meal.    . metoCLOPramide (REGLAN) 10 MG tablet Take 10 mg by mouth 4 (four) times daily.     . mometasone (NASONEX) 50 MCG/ACT nasal spray Place 2 sprays into  the nose daily. (Patient taking differently: Place 2 sprays into the nose daily as needed (allergies). ) 17 g 12  . montelukast (SINGULAIR) 10 MG tablet Take 10 mg by mouth at bedtime.    . Multiple Vitamin (MULTIVITAMIN WITH MINERALS) TABS tablet Take 1 tablet by mouth daily. One A Day 50+    . nitroGLYCERIN (NITROSTAT) 0.4 MG SL tablet Place 1 tablet (0.4 mg total) under the tongue every 5 (five) minutes as needed for chest pain. 25 tablet 3  . omeprazole (PRILOSEC) 20 MG capsule Take 20 mg by mouth 2 (two) times daily before a meal.    . pantoprazole (PROTONIX) 40 MG tablet Take 40 mg by mouth daily.    . polyethylene glycol (MIRALAX / GLYCOLAX) packet Take 17 g by mouth every other day.     Marland Kitchen tiZANidine (ZANAFLEX) 2 MG tablet Take 2 mg by mouth every 8 (eight) hours as needed for muscle spasms.     Marland Kitchen topiramate (TOPAMAX) 50 MG tablet Take 50 mg by mouth 2 (two) times daily.    . Vitamin D, Ergocalciferol, (DRISDOL) 50000 UNITS CAPS Take 50,000 Units by mouth every 7 (seven) days. Thursdays      Home: Home Living Family/patient expects to be discharged to:: Private residence Living Arrangements: Alone Type of Home: Apartment Home Access: Level entry Home Layout: One level Bathroom Shower/Tub: Multimedia programmer: Handicapped height [place]: None Additional Comments: able to drive   Functional History: Prior Function Level of Independence: Independent  Functional Status:  Mobility: Bed Mobility Overal bed mobility: Needs Assistance Bed Mobility: Rolling, Sidelying to Sit Rolling: Mod assist Sidelying to sit: Max assist, +2 for physical assistance General bed mobility comments: VCs for technique. Assist to bring LEs off EOB and to bring trunk from sidelying to sit. Transfers Overall transfer level: Needs assistance Equipment used: None Transfers: Sit to/from Stand, Stand Pivot Transfers Sit to Stand: Max assist, +2 physical assistance Stand pivot transfers:  Max assist, +2 physical assistance General transfer comment: L knee blocked during sit to stand and transfer. L knee locked into full extension in standing. Ambulation/Gait Ambulation/Gait assistance: Min assist  Ambulation Distance (Feet): 60 Feet (x1, 50 ft x1) Assistive device: Rolling walker (2 wheeled) Gait Pattern/deviations: Step-through pattern, Decreased stride length General Gait Details: Required max verbal cues to maintain L hand grip on walker. Required seated rest break of 3 min, +SOB.Marland Kitchen with onset of fatique, L LE instability. Gait velocity: decreased gait velocity Gait velocity interpretation: Below normal speed for age/gender    ADL: ADL Overall ADL's : Needs assistance/impaired Eating/Feeding: Set up, Sitting Eating/Feeding Details (indicate cue type and reason): daugther present and cutting up food for patient. Pt able to use R hand to self feed Grooming: Wash/dry hands, Set up, Sitting Grooming Details (indicate cue type and reason): incr effort for L Ue ot meet R Ue Toilet Transfer: Maximal assistance, +2 for physical assistance, Stand-pivot, BSC Toilet Transfer Details (indicate cue type and reason): Simulated by stand pivot transfer from EOB to chair. Toileting- Water quality scientist and Hygiene: Min guard, Sitting/lateral lean Functional mobility during ADLs: Maximal assistance, +2 for physical assistance (for stand pivot only) General ADL Comments: Pt with no active movement noted in L UE. Educated pt on attempting to move/use L UE functionally, proper positioning of LUE. Pt easily distracted and talking a lot, max verbal cues to focus on task.  Cognition: Cognition Overall Cognitive Status: Impaired/Different from baseline Orientation Level: Oriented X4 Cognition Arousal/Alertness: Awake/alert Behavior During Therapy: Impulsive (easily distracted) Overall Cognitive Status: Impaired/Different from baseline Area of Impairment: Memory, Safety/judgement Memory:  Decreased short-term memory Safety/Judgement: Decreased awareness of safety General Comments: Pt recalled name of PT from treatment two days prior.   Physical Exam: Blood pressure 151/81, pulse 90, temperature 98 F (36.7 C), temperature source Oral, resp. rate 18, height 5' 1"  (1.549 m), weight 96.9 kg (213 lb 10 oz), SpO2 97 %. Physical Exam Constitutional: She is oriented to person, place, and time. She appears well-developed and well-nourished.  HENT:  Head: Normocephalic and atraumatic.  Eyes: Right eye exhibits no discharge. Left eye exhibits no discharge.  Pupils round and reactive to light  Neck: Normal range of motion. Neck supple. No thyromegaly present.  Cardiovascular: Normal rate and regular rhythm.  Respiratory: Effort normal and breath sounds normal. No respiratory distress.  GI: Soft. Bowel sounds are normal. She exhibits no distension.  Musculoskeletal: She exhibits no edema or tenderness.  Neurological: She is alert and oriented to person, place, and time.  Follow simple commands.  Fair awareness of deficits Left facial weakness. Sensation intact to light touch DTRs symmetric Motor: RUE: 5/5 proximal to distal RLE: Hip flexion 4+/5, knee extension 4/5, ankle dorsi/plantar flexion 5/5 LUE: 0/5  LLE: able to wiggle toes, otherwise 0/5 Skin: Skin is warm and dry.  Psychiatric:  Mood, affect, behavior appear to be WNL.  Results for orders placed or performed during the hospital encounter of 03/10/16 (from the past 48 hour(s))  Glucose, capillary     Status: Abnormal   Collection Time: 03/12/16  4:40 PM  Result Value Ref Range   Glucose-Capillary 262 (H) 65 - 99 mg/dL   Comment 1 Notify RN    Comment 2 Document in Chart   Glucose, capillary     Status: Abnormal   Collection Time: 03/12/16  8:18 PM  Result Value Ref Range   Glucose-Capillary 219 (H) 65 - 99 mg/dL   Comment 1 Notify RN    Comment 2 Document in Chart   Glucose, capillary     Status: Abnormal     Collection Time: 03/12/16 10:37 PM  Result Value Ref Range  Glucose-Capillary 146 (H) 65 - 99 mg/dL  Basic metabolic panel     Status: Abnormal   Collection Time: 03/13/16  5:19 AM  Result Value Ref Range   Sodium 141 135 - 145 mmol/L   Potassium 3.7 3.5 - 5.1 mmol/L   Chloride 112 (H) 101 - 111 mmol/L   CO2 20 (L) 22 - 32 mmol/L   Glucose, Bld 155 (H) 65 - 99 mg/dL   BUN 14 6 - 20 mg/dL   Creatinine, Ser 0.85 0.44 - 1.00 mg/dL   Calcium 9.2 8.9 - 10.3 mg/dL   GFR calc non Af Amer >60 >60 mL/min   GFR calc Af Amer >60 >60 mL/min    Comment: (NOTE) The eGFR has been calculated using the CKD EPI equation. This calculation has not been validated in all clinical situations. eGFR's persistently <60 mL/min signify possible Chronic Kidney Disease.    Anion gap 9 5 - 15  Glucose, capillary     Status: Abnormal   Collection Time: 03/13/16  6:10 AM  Result Value Ref Range   Glucose-Capillary 159 (H) 65 - 99 mg/dL   Comment 1 Notify RN    Comment 2 Document in Chart   Glucose, capillary     Status: Abnormal   Collection Time: 03/13/16 11:33 AM  Result Value Ref Range   Glucose-Capillary 251 (H) 65 - 99 mg/dL   Comment 1 Notify RN    Comment 2 Document in Chart   Glucose, capillary     Status: Abnormal   Collection Time: 03/13/16  4:06 PM  Result Value Ref Range   Glucose-Capillary 221 (H) 65 - 99 mg/dL   Comment 1 Notify RN    Comment 2 Document in Chart   Glucose, capillary     Status: Abnormal   Collection Time: 03/13/16  5:51 PM  Result Value Ref Range   Glucose-Capillary 293 (H) 65 - 99 mg/dL   Comment 1 Notify RN    Comment 2 Document in Chart   Glucose, capillary     Status: Abnormal   Collection Time: 03/13/16  9:33 PM  Result Value Ref Range   Glucose-Capillary 206 (H) 65 - 99 mg/dL   Comment 1 Notify RN    Comment 2 Document in Chart   Glucose, capillary     Status: Abnormal   Collection Time: 03/14/16  6:27 AM  Result Value Ref Range   Glucose-Capillary 173  (H) 65 - 99 mg/dL   Comment 1 Notify RN    Comment 2 Document in Chart   Glucose, capillary     Status: Abnormal   Collection Time: 03/14/16 11:29 AM  Result Value Ref Range   Glucose-Capillary 214 (H) 65 - 99 mg/dL   Comment 1 Notify RN    Comment 2 Document in Chart    Mr Brain Ltd W/o Cm  03/12/2016  CLINICAL DATA:  65 year old female with abrupt onset left side weakness beginning overnight July 1st-2nd, slid out of bed and was found down by her daughter 6 hours later. Right MCA infarct. Initial encounter. EXAM: MRI HEAD WITHOUT CONTRAST TECHNIQUE: Multiplanar, multiecho pulse sequences of the brain and surrounding structures were obtained without intravenous contrast. COMPARISON:  Brain MRI 03/10/2016 and other recent studies. FINDINGS: The examination had to be discontinued prior to completion due to patient condition ; refusal to complete. Today images are also mildly degraded by motion despite repeated imaging attempts. Major intracranial vascular flow voids are stable. Increased volume of restricted diffusion in the  right MCA territory, centered on the insula and operculum as before. The right basal ganglia remain spared. More of the posterior right temporal lobe and lateral right peri-Rolandic cortex is affected now. Associated increased T2 and FLAIR signal in a cytotoxic edema pattern. Mildly increased mass effect on the right lateral ventricle, but no evidence of associated hemorrhage. No midline shift. No ventriculomegaly. No contralateral or posterior fossa restricted diffusion. Stable gray and white matter signal outside of the acutely affected parenchyma. Small chronic left cerebellar infarcts again noted. Small chronic anterior left insula infarct again noted. Occasional other small chronic cortically based infarcts in both hemispheres were better demonstrated on the prior MRI due to motion today. Visualized paranasal sinuses and mastoids are stable and well pneumatized. Stable orbit and  scalp soft tissues. IMPRESSION: 1. Mild extension of the acute right MCA infarct since 03/10/2016. Increased cytotoxic edema, but no associated hemorrhage or significant intracranial mass effect at this time. 2. No new intracranial abnormality. Electronically Signed   By: Genevie Ann M.D.   On: 03/12/2016 16:29    Medical Problem List and Plan: 1.  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 Status post loop recorder 2.  DVT Prophylaxis/Anticoagulation: SCDs. Monitor for any signs of DVT. Venous Doppler study negative 3. Pain Management: Neurontin 300 mg 3 times a day, Topamax 50 mg twice a day, hydrocodone as needed 4. Diabetes mellitus of peripheral neuropathy. Hemoglobin A1c 7.3. Lantus insulin 45 units twice a day, NovoLog 15 units 3 times a day. Check blood sugars before meals and at bedtime. Diabetic teaching 5. Neuropsych: This patient is capable of making decisions on her own behalf. 6. Skin/Wound Care: Routine skin checks 7. Fluids/Electrolytes/Nutrition: Routine I&O with follow-up chemistries 8. Hypertension. Coreg 6.25 mg twice a day 9. Hypothyroidism. Synthroid 10. Hyperlipidemia. Crestor, fenofibrate 11. Asthma. Singulair 10 mg daily, Dulera 2 puffs twice daily, albuterol nebulizer as needed 12. Morbid Obesity. Body mass index is 40.39 kg/(m^2)., Diet and exercise education, cont to encourage weight loss to increase endurance and promote overall health. Dietary follow-up  13. Morganella UTI. 7 day course of Vantin 200 mg every 12 hours times initiated 03/13/2016 14. Obesity hypoventilation syndrome: CPAP   Post Admission Physician Evaluation: 1. Functional deficits secondary  to right MCA territory embolic infarct with extension due to hypotension. 2. Patient is admitted to receive collaborative, interdisciplinary care between the physiatrist, rehab nursing staff, and therapy team. 3. Patient's level of medical complexity and  substantial therapy needs in context of that medical necessity cannot be provided at a lesser intensity of care such as a SNF. 4. Patient has experienced substantial functional loss from his/her baseline which was documented above under the "Functional History" and "Functional Status" headings.  Judging by the patient's diagnosis, physical exam, and functional history, the patient has potential for functional progress which will result in measurable gains while on inpatient rehab.  These gains will be of substantial and practical use upon discharge  in facilitating mobility and self-care at the household level. 5. Physiatrist will provide 24 hour management of medical needs as well as oversight of the therapy plan/treatment and provide guidance as appropriate regarding the interaction of the two. 6. 24 hour rehab nursing will assist with safety, skin/wound care, disease management, medication administration and patient education and help integrate therapy concepts, techniques,education, etc. 7. PT will assess and treat for/with: Lower extremity strength, range of motion, stamina, balance, functional mobility, safety, adaptive techniques and equipment, coping  skills, pain control, stroke education.   Goals are: Mod/Max A. 8. OT will assess and treat for/with: ADL's, functional mobility, safety, upper extremity strength, adaptive techniques and equipment,  ego support, and community reintegration.   Goals are: Mod/Min A. Therapy may proceed with showering this patient. 9. SLP will assess and treat for/with: speech.  Goals are: Mod I/Ind. 10. Case Management and Social Worker will assess and treat for psychological issues and discharge planning. 11. Team conference will be held weekly to assess progress toward goals and to determine barriers to discharge. 12. Patient will receive at least 3 hours of therapy per day at least 5 days per week. 13. ELOS: 18-22 days.       14. Prognosis:  good and fair  Delice Lesch, MD 03/14/2016

## 2016-03-14 NOTE — Progress Notes (Deleted)
Pt unable to void after 6 hours, bladder scan 37ml. Lower abdomen soft, pt denies pain to touch. Spoke to Md instructed to not straight cath if less than 59ml.  Will continue to monitor.

## 2016-03-14 NOTE — PMR Pre-admission (Signed)
PMR Admission Coordinator Pre-Admission Assessment  Patient: Danielle Vargas is an 65 y.o., female MRN: [id number] DOB: Jul 03, 1951 Height: 5\' 1"  (154.9 cm) (patient reported) Weight: 96.9 kg (213 lb 10 oz)              Insurance Information HMO:   PPO:      PCP:      IPA:      80/20: yes     OTHER: no HMO PRIMARY: Medicare a and b      Policy#: [id number]      Subscriber: pt Benefits:  Phone #: online     Name: 03/14/16 Eff. Date: a 12/08/2010 b 04/08/2012     Deduct: $1316      Out of Pocket Max: none      Life Max: none CIR: 100%      SNF: 20 full days Outpatient: 80%     Co-Pay: 20% Home Health: 100%      Co-Pay: none DME: 80%     Co-Pay: 20% Providers: pt choice  SECONDARY: aarp      Policy#: [id number]      Subscriber: pt Medicare supplement  Medicaid Application Date:       Case Manager:  Disability Application Date:       Case Worker:   Emer[employer] [place]    Name Relation Home Work Tonsina Daughter [phone]     Current Medical History  Patient Admitting Diagnosis: right CVA  History of Present Illness: Danielle Vargas is a 65 y.o. right handed female with history of hypertension, obesity,Diabetes mellitus, hyperlipidemia and left rotator cuff repair. Presented 03/10/2016 with left-sided weakness after a fall from bed. She denied any loss of consciousness MRI of the brain showed extensive areas of both focal and confluent restricted diffusion throughout the right MCA distribution. CTA suspect right M1-MCA long segment stenosis or occlusion. 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 Patient did not receive TPA. Echocardiogram completed with limited study. The left ventricle appeared normal in overall size and function.On the afternoon of 03/11/2016 with increased increased right-sided weakness and slurred speech. Follow-up CT of the head showed no evidence of new infarct . MRI  showed mild extension of acute right MCA infarct as compared to 03/10/2016 felt to be secondary hypotension episodes.  Neurology services recommended to continue Plavix. TEE 03/12/2016 showing no cardiac source of embolism. LVH LAD dilation present as well as placement of loop recorder. Neurology consulted currently on Plavix for CVA prophylaxis. Morganella UTI completing 7 day course of Vantin initiated 03/13/2016.  Plan to allow for permissive hypotension. Decrease carvedilol 6.25 mg twice a day on 7/5. Can increase carvedilol back to 12.5 mg bid on 7/9 if BP remains elevated. Discontinue amlodipine and losartan.   Total: 9 NIH    Past Medical History  Past Medical History  Diagnosis Date  . DM (diabetes mellitus) ([place])   . Obesity   . Dyslipidemia   . HTN (hypertension)   . Hypothyroidism   . Gastroparesis   . Migraine   . Osteoporosis   . Colon polyps 7/07    hyperplastic and adenomatous  . Nephrolithiasis   . Diverticulosis   . External hemorrhoid   . Fatty liver   . Asthma   . CAD (coronary artery disease)     a. 12/2008 Cath: mild irregs throughout, EF 75%.  . 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 Allergy   . Cataract     "  just beginning"  . Myocardial infarction ([place])     FEb 2015  . Depression     pt unsure, psychologist said no  . Sleep apnea     wears CPAP  . Hyperlipidemia     Family History  family history includes COPD in her father; Cirrhosis in her sister; Colon cancer (age of onset: 73) in her paternal aunt; Diabetes in her brother and mother; Emphysema in her father; Heart attack in her brother, mother, and paternal grandfather; Heart failure in her brother and mother; Hypertension in her brother; Kidney disease in her mother; Liver disease in her sister; Lung cancer in her brother; Stroke in her father. There is no history of Esophageal cancer, Stomach cancer, or Rectal cancer.  Prior Rehab/Hospitalizations:  Has the patient had major surgery during  100 days prior to admission? No  Current Medications   Current facility-administered medications:  .  acetaminophen (TYLENOL) tablet 650 mg, 650 mg, Oral, Q4H PRN, 650 mg at 03/11/16 0701 **OR** acetaminophen (TYLENOL) suppository 650 mg, 650 mg, Rectal, Q4H PRN, Rondel Jumbo, PA-C .  albuterol (PROVENTIL) (2.5 MG/3ML) 0.083% nebulizer solution 2.5 mg, 2.5 mg, Nebulization, Q6H PRN, Romona Curls, RPH .  carvedilol (COREG) tablet 6.25 mg, 6.25 mg, Oral, BID WC, Rosalin Hawking, MD, 6.25 mg at 03/14/16 0747 .  cefpodoxime (VANTIN) tablet 200 mg, 200 mg, Oral, Q12H, David Tat, MD .  clopidogrel (PLAVIX) tablet 75 mg, 75 mg, Oral, Daily, Lily Kocher, MD, 75 mg at 03/14/16 1054 .  docusate sodium (COLACE) capsule 100 mg, 100 mg, Oral, BID, Orson Eva, MD, 100 mg at 03/14/16 1223 .  fenofibrate tablet 160 mg, 160 mg, Oral, QHS, Rondel Jumbo, PA-C, 160 mg at 03/13/16 2149 .  gabapentin (NEURONTIN) capsule 300 mg, 300 mg, Oral, TID, Rondel Jumbo, PA-C, 300 mg at 03/14/16 1544 .  HYDROcodone-acetaminophen (NORCO/VICODIN) 5-325 MG per tablet 1 tablet, 1 tablet, Oral, TID PRN, Modena Jansky, MD, 1 tablet at 03/14/16 0218 .  insulin aspart (novoLOG) injection 0-15 Units, 0-15 Units, Subcutaneous, TID WC, Modena Jansky, MD, 5 Units at 03/14/16 1155 .  insulin aspart (novoLOG) injection 0-5 Units, 0-5 Units, Subcutaneous, QHS, Modena Jansky, MD, 2 Units at 03/13/16 2150 .  insulin aspart (novoLOG) injection 15 Units, 15 Units, Subcutaneous, TID WC, David Tat, MD .  insulin glargine (LANTUS) injection 45 Units, 45 Units, Subcutaneous, BID, Orson Eva, MD .  levothyroxine (SYNTHROID, LEVOTHROID) tablet 100 mcg, 100 mcg, Oral, QAC breakfast, Orson Eva, MD, 100 mcg at 03/14/16 0747 .  metoCLOPramide (REGLAN) tablet 10 mg, 10 mg, Oral, QID, Coralee Pesa Wertman, PA-C, 10 mg at 03/14/16 1400 .  mometasone-formoterol (DULERA) 200-5 MCG/ACT inhaler 2 puff, 2 puff, Inhalation, BID, Rondel Jumbo, PA-C, 2 puff at  03/14/16 [phone] .  montelukast (SINGULAIR) tablet 10 mg, 10 mg, Oral, QHS, Rondel Jumbo, PA-C, 10 mg at 03/13/16 2151 .  nitroGLYCERIN (NITROSTAT) SL tablet 0.4 mg, 0.4 mg, Sublingual, Q5 min PRN, Modena Jansky, MD .  pantoprazole (PROTONIX) EC tablet 40 mg, 40 mg, Oral, Daily, Rondel Jumbo, PA-C, 40 mg at 03/14/16 1054 .  polyethylene glycol (MIRALAX / GLYCOLAX) packet 17 g, 17 g, Oral, QODAY, Modena Jansky, MD, 17 g at 03/13/16 1123 .  rosuvastatin (CRESTOR) tablet 20 mg, 20 mg, Oral, Daily, Rondel Jumbo, PA-C, 20 mg at 03/14/16 1053 .  topiramate (TOPAMAX) tablet 50 mg, 50 mg, Oral, BID, Coralee Pesa Wertman, PA-C, 50 mg at  03/14/16 1054  Patients Current Diet: Diet heart healthy/carb modified Room service appropriate?: Yes; Fluid consistency:: Thin  Precautions / Restrictions Precautions Precautions: Fall Restrictions Weight Bearing Restrictions: No   Has the patient had 2 or more falls or a fall with injury in the past year?No  Prior Activity Level Community (5-7x/wk): Independent and driving without use of AD pta  Home Assistive Devices / Hooker Devices/Equipment: None Home Equipment: None  Prior Device Use: Indicate devices/aids used by the patient prior to current illness, exacerbation or injury? None of the above  Prior Functional Level Prior Function Level of Independence: Independent  Self Care: Did the patient need help bathing, dressing, using the toilet or eating?  Independent  Indoor Mobility: Did the patient need assistance with walking from room to room (with or without device)? Independent  Stairs: Did the patient need assistance with internal or external stairs (with or without device)? Independent  Functional Cognition: Did the patient need help planning regular tasks such as shopping or remembering to take medications? Independent  Current Functional Level Cognition  Overall Cognitive Status: Impaired/Different from  baseline Orientation Level: Oriented X4 Safety/Judgement: Decreased awareness of safety General Comments: Pt recalled name of PT from treatment two days prior.     Extremity Assessment (includes Sensation/Coordination)  Upper Extremity Assessment: LUE deficits/detail LUE Deficits / Details: decreased ability to maintain L grip on RW   Lower Extremity Assessment: LLE deficits/detail LLE Deficits / Details: generalized weakness    ADLs  Overall ADL's : Needs assistance/impaired Eating/Feeding: Set up, Sitting Eating/Feeding Details (indicate cue type and reason): daugther present and cutting up food for patient. Pt able to use R hand to self feed Grooming: Wash/dry hands, Set up, Sitting Grooming Details (indicate cue type and reason): incr effort for L Ue ot meet R Ue Toilet Transfer: Maximal assistance, +2 for physical assistance, Stand-pivot, BSC Toilet Transfer Details (indicate cue type and reason): Simulated by stand pivot transfer from EOB to chair. Toileting- Water quality scientist and Hygiene: Min guard, Sitting/lateral lean Functional mobility during ADLs: Maximal assistance, +2 for physical assistance (for stand pivot only) General ADL Comments: Pt with no active movement noted in L UE. Educated pt on attempting to move/use L UE functionally, proper positioning of LUE. Pt easily distracted and talking a lot, max verbal cues to focus on task.    Mobility  Overal bed mobility: Needs Assistance Bed Mobility: Rolling, Sidelying to Sit Rolling: Mod assist Sidelying to sit: Max assist, +2 for physical assistance General bed mobility comments: VCs for technique. Assist to bring LEs off EOB and to bring trunk from sidelying to sit.    Transfers  Overall transfer level: Needs assistance Equipment used: None Transfers: Sit to/from Stand, Stand Pivot Transfers Sit to Stand: Max assist, +2 physical assistance Stand pivot transfers: Max assist, +2 physical assistance General transfer  comment: L knee blocked during sit to stand and transfer. L knee locked into full extension in standing.    Ambulation / Gait / Stairs / Wheelchair Mobility  Ambulation/Gait Ambulation/Gait assistance: Museum/gallery curator (Feet): 60 Feet (x1, 50 ft x1) Assistive device: Rolling walker (2 wheeled) Gait Pattern/deviations: Step-through pattern, Decreased stride length General Gait Details: Required max verbal cues to maintain L hand grip on walker. Required seated rest break of 3 min, +SOB.Marland Kitchen with onset of fatique, L LE instability. Gait velocity: decreased gait velocity Gait velocity interpretation: Below normal speed for age/gender    Posture / Balance Dynamic Sitting Balance Sitting balance -  Comments: L lateral lean in sitting. Pt able to correct at times with max verbal cues but unable to sustain. Balance Overall balance assessment: Needs assistance Sitting-balance support: Feet supported, Bilateral upper extremity supported Sitting balance-Leahy Scale: Poor Sitting balance - Comments: L lateral lean in sitting. Pt able to correct at times with max verbal cues but unable to sustain. Postural control: Left lateral lean Standing balance support: Bilateral upper extremity supported Standing balance-Leahy Scale: Zero Standing balance comment: Max assist +2 to maintain standing balance.    Special needs/care consideration BiPAP/CPAP CPAP pta Skin abrasion to left leg                         Bowel mgmt: continent LBM 03/14/16 Bladder mgmt: incontinent at times this admission Diabetic mgmt yes insulin pta hgb A1c 7.3 Loop recorder placed this admission   Previous Home Environment Living Arrangements: Alone  Lives With: Alone Available Help at Discharge: Family, Available PRN/intermittently (dtr local, son in [place]) Type of Home: Apartment Home Layout: One level Home Access: Level entry Bathroom Shower/Tub: Multimedia programmer: Handicapped height Bathroom  Accessibility: Yes How Accessible: Accessible via walker [place]: No Additional Comments: able to drive  Discharge Living Setting Plans for Discharge Living Setting: Other (Comment) (plans to d/c to SNF once mediclaly ready for d/c) Type of Home at Discharge: [place] Name at Discharge: to be determined Does the patient have any problems obtaining your medications?: No  Social/Family/Support Systems Patient Roles: Parent Contact Information: Curt Bears, daughter Anticipated Caregiver: daughter prn assist only  Anticipated Caregiver's Contact Information: see above Ability/Limitations of Caregiver: daughter works outside the home, son in [place] Availability: Intermittent Discharge Plan Discussed with Primary Caregiver: Yes Is Caregiver In Agreement with Plan?: Yes Does Caregiver/Family have Issues with Lodging/Transportation while Pt is in Rehab?: No  Goals/Additional Needs Patient/Family Goal for Rehab: min assist with PT, OT and supervision with SLP Expected length of stay: [facility] 10- 12 days then to SNF Pt/Family Agrees to Admission and willing to participate: Yes Program Orientation Provided & Reviewed with Pt/Caregiver Including Roles  & Responsibilities: Yes  Decrease burden of Care through IP rehab admission: Decrease number of caregivers, bowel and bladder program, medical satbility  Possible need for SNF placement upon discharge: discussed with pt and her daughter on 03/14/16 the need for SNF placement after CIR due to her care needs with daughter working and for continued rehab recovery. BOth are in agreement.  Patient Condition: This patient's medical and functional status has changed since the consult dated: 03/12/2016 in which the Rehabilitation Physician determined and documented that the patient's condition is appropriate for intensive rehabilitative care in an inpatient rehabilitation facility. See "History of Present Illness"  (above) for medical update. Functional changes are: overall max assist for transfers after extension of her CVA on 03/12/16. Patient's medical and functional status update has been discussed with the Rehabilitation physician and patient remains appropriate for inpatient rehabilitation. Will admit to inpatient rehab today.  Preadmission Screen Completed By:  Cleatrice Burke, 03/14/2016 4:15 PM ______________________________________________________________________   Discussed status with Dr. Posey Pronto on 03/14/16 at 1614 and received telephone approval for admission today.  Admission Coordinator:  Cleatrice Burke, time [id number] Date 03/14/2016.

## 2016-03-14 NOTE — Interval H&P Note (Signed)
Danielle Vargas was admitted today to Inpatient Rehabilitation with the diagnosis of right MCA territory embolic infarct with extension due to hypotension.  The patient's history has been reviewed, patient examined, and there is no change in status.  Patient continues to be appropriate for intensive inpatient rehabilitation.  I have reviewed the patient's chart and labs.  Questions were answered to the patient's satisfaction. The PAPE has been reviewed and assessment remains appropriate.  Jaivian Battaglini Lorie Phenix 03/14/2016, 9:26 PM

## 2016-03-14 NOTE — Progress Notes (Signed)
I met with pt at bedside to discuss her rehab needs. She is in agreement to admit to inpt rehab initially with eventual d/c to SNF rehab when pt medically ready for d/c. I contacted Dr. Posey Pronto and Dr. Carles Collet to clarify goals and medical readiness for d/c to inpt rehab today. I spoke by phone with pt's daughter and she is in agreement to this plan. RN CM and SW are aware. I will make the arrangements to admit today. [phone]

## 2016-03-14 NOTE — Progress Notes (Addendum)
Paged with [facility] [phone]  -Called RN and instructed to give novolog 35 units [place] x1--clarified that no other novolog to be given except the 35 units x 1   DTat   Wrong patient--disregard above note  DTat

## 2016-03-14 NOTE — Progress Notes (Signed)
Danielle Gong, RN Rehab Admission Coordinator Signed Physical Medicine and Rehabilitation PMR Pre-admission 03/14/2016 3:58 PM  Related encounter: ED to Hosp-Admission (Current) from 03/10/2016 in [place] [facility]apse All   PMR Admission Coordinator Pre-Admission Assessment  Patient: Danielle Vargas is an 65 y.o., female MRN: [id number] DOB: 1950-10-08 Height: 5\' 1"  (154.9 cm) (patient reported) Weight: 96.9 kg (213 lb 10 oz)  Insurance Information HMO: PPO: PCP: IPA: 80/20: yes OTHER: no HMO PRIMARY: Medicare a and b Policy#: [id number] Subscriber: pt Benefits: Phone #: online Name: 03/14/16 Eff. Date: a 12/08/2010 b 04/08/2012 Deduct: $1316 Out of Pocket Max: none Life Max: none CIR: 100% SNF: 20 full days Outpatient: 80% Co-Pay: 20% Home Health: 100% Co-Pay: none DME: 80% Co-Pay: 20% Providers: pt choice  SECONDARY: aarp Policy#: [id number] Subscriber: pt Medicare supplement  Medicaid Application Date: Case Manager:  Disability Application Date: Case Worker:   Emergency [place]    Name Relation Home Work [place] Daughter [phone]     Current Medical History  Patient Admitting Diagnosis: right CVA  History of Present Illness: Danielle Vargas is a 65 y.o. right handed female with history of hypertension, obesity,Diabetes mellitus, hyperlipidemia and left rotator cuff repair. Presented 03/10/2016 with left-sided weakness after a fall from bed. She denied any loss of consciousness MRI of the brain showed extensive areas of both focal and confluent restricted diffusion throughout the right MCA distribution. CTA suspect  right M1-MCA long segment stenosis or occlusion. 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 Patient did not receive TPA. Echocardiogram completed with limited study. The left ventricle appeared normal in overall size and function.On the afternoon of 03/11/2016 with increased increased right-sided weakness and slurred speech. Follow-up CT of the head showed no evidence of new infarct . MRI showed mild extension of acute right MCA infarct as compared to 03/10/2016 felt to be secondary hypotension episodes. Neurology services recommended to continue Plavix. TEE 03/12/2016 showing no cardiac source of embolism. LVH LAD dilation present as well as placement of loop recorder. Neurology consulted currently on Plavix for CVA prophylaxis. Morganella UTI completing 7 day course of Vantin initiated 03/13/2016.  Plan to allow for permissive hypotension. Decrease carvedilol 6.25 mg twice a day on 7/5. Can increase carvedilol back to 12.5 mg bid on 7/9 if BP remains elevated. Discontinue amlodipine and losartan.   Total: 9 NIH    Past Medical History  Past Medical History  Diagnosis Date  . DM (diabetes mellitus) ([place])   . Obesity   . Dyslipidemia   . HTN (hypertension)   . Hypothyroidism   . Gastroparesis   . Migraine   . Osteoporosis   . Colon polyps 7/07    hyperplastic and adenomatous  . Nephrolithiasis   . Diverticulosis   . External hemorrhoid   . Fatty liver   . Asthma   . CAD (coronary artery disease)     a. 12/2008 Cath: mild irregs throughout, EF 75%.  . 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 Allergy   . Cataract     "just beginning"  . Myocardial infarction ([place])     FEb 2015  . Depression     pt unsure, psychologist said no  . Sleep apnea     wears CPAP  . Hyperlipidemia     Family History  family history includes COPD in her father; Cirrhosis in her sister;  Colon cancer (  age of onset: 40) in her paternal aunt; Diabetes in her brother and mother; Emphysema in her father; Heart attack in her brother, mother, and paternal grandfather; Heart failure in her brother and mother; Hypertension in her brother; Kidney disease in her mother; Liver disease in her sister; Lung cancer in her brother; Stroke in her father. There is no history of Esophageal cancer, Stomach cancer, or Rectal cancer.  Prior Rehab/Hospitalizations:  Has the patient had major surgery during 100 days prior to admission? No  Current Medications   Current facility-administered medications:  . acetaminophen (TYLENOL) tablet 650 mg, 650 mg, Oral, Q4H PRN, 650 mg at 03/11/16 0701 **OR** acetaminophen (TYLENOL) suppository 650 mg, 650 mg, Rectal, Q4H PRN, Rondel Jumbo, PA-C . albuterol (PROVENTIL) (2.5 MG/3ML) 0.083% nebulizer solution 2.5 mg, 2.5 mg, Nebulization, Q6H PRN, Romona Curls, RPH . carvedilol (COREG) tablet 6.25 mg, 6.25 mg, Oral, BID WC, Rosalin Hawking, MD, 6.25 mg at 03/14/16 0747 . cefpodoxime (VANTIN) tablet 200 mg, 200 mg, Oral, Q12H, David Tat, MD . clopidogrel (PLAVIX) tablet 75 mg, 75 mg, Oral, Daily, Lily Kocher, MD, 75 mg at 03/14/16 1054 . docusate sodium (COLACE) capsule 100 mg, 100 mg, Oral, BID, Orson Eva, MD, 100 mg at 03/14/16 1223 . fenofibrate tablet 160 mg, 160 mg, Oral, QHS, Rondel Jumbo, PA-C, 160 mg at 03/13/16 2149 . gabapentin (NEURONTIN) capsule 300 mg, 300 mg, Oral, TID, Rondel Jumbo, PA-C, 300 mg at 03/14/16 1544 . HYDROcodone-acetaminophen (NORCO/VICODIN) 5-325 MG per tablet 1 tablet, 1 tablet, Oral, TID PRN, Modena Jansky, MD, 1 tablet at 03/14/16 0218 . insulin aspart (novoLOG) injection 0-15 Units, 0-15 Units, Subcutaneous, TID WC, Modena Jansky, MD, 5 Units at 03/14/16 1155 . insulin aspart (novoLOG) injection 0-5 Units, 0-5 Units, Subcutaneous, QHS, Modena Jansky, MD, 2 Units at 03/13/16 2150 . insulin aspart (novoLOG)  injection 15 Units, 15 Units, Subcutaneous, TID WC, David Tat, MD . insulin glargine (LANTUS) injection 45 Units, 45 Units, Subcutaneous, BID, Orson Eva, MD . levothyroxine (SYNTHROID, LEVOTHROID) tablet 100 mcg, 100 mcg, Oral, QAC breakfast, Orson Eva, MD, 100 mcg at 03/14/16 0747 . metoCLOPramide (REGLAN) tablet 10 mg, 10 mg, Oral, QID, Coralee Pesa Wertman, PA-C, 10 mg at 03/14/16 1400 . mometasone-formoterol (DULERA) 200-5 MCG/ACT inhaler 2 puff, 2 puff, Inhalation, BID, Rondel Jumbo, PA-C, 2 puff at 03/14/16 [phone] . montelukast (SINGULAIR) tablet 10 mg, 10 mg, Oral, QHS, Rondel Jumbo, PA-C, 10 mg at 03/13/16 2151 . nitroGLYCERIN (NITROSTAT) SL tablet 0.4 mg, 0.4 mg, Sublingual, Q5 min PRN, Modena Jansky, MD . pantoprazole (PROTONIX) EC tablet 40 mg, 40 mg, Oral, Daily, Rondel Jumbo, PA-C, 40 mg at 03/14/16 1054 . polyethylene glycol (MIRALAX / GLYCOLAX) packet 17 g, 17 g, Oral, QODAY, Modena Jansky, MD, 17 g at 03/13/16 1123 . rosuvastatin (CRESTOR) tablet 20 mg, 20 mg, Oral, Daily, Rondel Jumbo, PA-C, 20 mg at 03/14/16 1053 . topiramate (TOPAMAX) tablet 50 mg, 50 mg, Oral, BID, Rondel Jumbo, PA-C, 50 mg at 03/14/16 1054  Patients Current Diet: Diet heart healthy/carb modified Room service appropriate?: Yes; Fluid consistency:: Thin  Precautions / Restrictions Precautions Precautions: Fall Restrictions Weight Bearing Restrictions: No   Has the patient had 2 or more falls or a fall with injury in the past year?No  Prior Activity Level Community (5-7x/wk): Independent and driving without use of AD pta  Home Assistive Devices / [place] Devices/Equipment: None Home Equipment: None  Prior Device Use: Indicate devices/aids  used by the patient prior to current illness, exacerbation or injury? None of the above  Prior Functional Level Prior Function Level of Independence: Independent  Self Care: Did the patient need help bathing, dressing, using the  toilet or eating? Independent  Indoor Mobility: Did the patient need assistance with walking from room to room (with or without device)? Independent  Stairs: Did the patient need assistance with internal or external stairs (with or without device)? Independent  Functional Cognition: Did the patient need help planning regular tasks such as shopping or remembering to take medications? Independent  Current Functional Level Cognition  Overall Cognitive Status: Impaired/Different from baseline Orientation Level: Oriented X4 Safety/Judgement: Decreased awareness of safety General Comments: Pt recalled name of PT from treatment two days prior.    Extremity Assessment (includes Sensation/Coordination)  Upper Extremity Assessment: LUE deficits/detail LUE Deficits / Details: decreased ability to maintain L grip on RW   Lower Extremity Assessment: LLE deficits/detail LLE Deficits / Details: generalized weakness    ADLs  Overall ADL's : Needs assistance/impaired Eating/Feeding: Set up, Sitting Eating/Feeding Details (indicate cue type and reason): daugther present and cutting up food for patient. Pt able to use R hand to self feed Grooming: Wash/dry hands, Set up, Sitting Grooming Details (indicate cue type and reason): incr effort for L Ue ot meet R Ue Toilet Transfer: Maximal assistance, +2 for physical assistance, Stand-pivot, BSC Toilet Transfer Details (indicate cue type and reason): Simulated by stand pivot transfer from EOB to chair. Toileting- Water quality scientist and Hygiene: Min guard, Sitting/lateral lean Functional mobility during ADLs: Maximal assistance, +2 for physical assistance (for stand pivot only) General ADL Comments: Pt with no active movement noted in L UE. Educated pt on attempting to move/use L UE functionally, proper positioning of LUE. Pt easily distracted and talking a lot, max verbal cues to focus on task.    Mobility  Overal bed mobility: Needs  Assistance Bed Mobility: Rolling, Sidelying to Sit Rolling: Mod assist Sidelying to sit: Max assist, +2 for physical assistance General bed mobility comments: VCs for technique. Assist to bring LEs off EOB and to bring trunk from sidelying to sit.    Transfers  Overall transfer level: Needs assistance Equipment used: None Transfers: Sit to/from Stand, Stand Pivot Transfers Sit to Stand: Max assist, +2 physical assistance Stand pivot transfers: Max assist, +2 physical assistance General transfer comment: L knee blocked during sit to stand and transfer. L knee locked into full extension in standing.    Ambulation / Gait / Stairs / Wheelchair Mobility  Ambulation/Gait Ambulation/Gait assistance: Museum/gallery curator (Feet): 60 Feet (x1, 50 ft x1) Assistive device: Rolling walker (2 wheeled) Gait Pattern/deviations: Step-through pattern, Decreased stride length General Gait Details: Required max verbal cues to maintain L hand grip on walker. Required seated rest break of 3 min, +SOB.Marland Kitchen with onset of fatique, L LE instability. Gait velocity: decreased gait velocity Gait velocity interpretation: Below normal speed for age/gender    Posture / Balance Dynamic Sitting Balance Sitting balance - Comments: L lateral lean in sitting. Pt able to correct at times with max verbal cues but unable to sustain. Balance Overall balance assessment: Needs assistance Sitting-balance support: Feet supported, Bilateral upper extremity supported Sitting balance-Leahy Scale: Poor Sitting balance - Comments: L lateral lean in sitting. Pt able to correct at times with max verbal cues but unable to sustain. Postural control: Left lateral lean Standing balance support: Bilateral upper extremity supported Standing balance-Leahy Scale: Zero Standing balance comment: Max assist +2  to maintain standing balance.    Special needs/care consideration BiPAP/CPAP CPAP pta Skin abrasion to left  leg  Bowel mgmt: continent LBM 03/14/16 Bladder mgmt: incontinent at times this admission Diabetic mgmt yes insulin pta hgb A1c 7.3 Loop recorder placed this admission   Previous Home Environment Living Arrangements: Alone Lives With: Alone Available Help at Discharge: Family, Available PRN/intermittently (dtr local, son in [place]) Type of Home: Apartment Home Layout: One level Home Access: Level entry Bathroom Shower/Tub: Multimedia programmer: Handicapped height Bathroom Accessibility: Yes How Accessible: Accessible via walker [place]: No Additional Comments: able to drive  Discharge Living Setting Plans for Discharge Living Setting: Other (Comment) (plans to d/c to SNF once mediclaly ready for d/c) Type of Home at Discharge: [place] Name at Discharge: to be determined Does the patient have any problems obtaining your medications?: No  Social/Family/Support Systems Patient Roles: Parent Contact Information: Curt Bears, daughter Anticipated Caregiver: daughter prn assist only  Anticipated Caregiver's Contact Information: see above Ability/Limitations of Caregiver: daughter works outside the home, son in [place] Availability: Intermittent Discharge Plan Discussed with Primary Caregiver: Yes Is Caregiver In Agreement with Plan?: Yes Does Caregiver/Family have Issues with Lodging/Transportation while Pt is in Rehab?: No  Goals/Additional Needs Patient/Family Goal for Rehab: min assist with PT, OT and supervision with SLP Expected length of stay: ELOS 10- 12 days then to SNF Pt/Family Agrees to Admission and willing to participate: Yes Program Orientation Provided & Reviewed with Pt/Caregiver Including Roles & Responsibilities: Yes  Decrease burden of Care through IP rehab admission: Decrease number of caregivers, bowel and bladder program, medical satbility  Possible need for SNF placement  upon discharge: discussed with pt and her daughter on 03/14/16 the need for SNF placement after CIR due to her care needs with daughter working and for continued rehab recovery. BOth are in agreement.  Patient Condition: This patient's medical and functional status has changed since the consult dated: 03/12/2016 in which the Rehabilitation Physician determined and documented that the patient's condition is appropriate for intensive rehabilitative care in an inpatient rehabilitation facility. See "History of Present Illness" (above) for medical update. Functional changes are: overall max assist for transfers after extension of her CVA on 03/12/16. Patient's medical and functional status update has been discussed with the Rehabilitation physician and patient remains appropriate for inpatient rehabilitation. Will admit to inpatient rehab today.  Preadmission Screen Completed By: Cleatrice Burke, 03/14/2016 4:15 PM ______________________________________________________________________  Discussed status with Dr. Posey Pronto on 03/14/16 at 1614 and received telephone approval for admission today.  Admission Coordinator: Cleatrice Burke, time [id number] Date 03/14/2016.          Cosigned by: Ankit Lorie Phenix, MD at 03/14/2016 4:16 PM  Revision History     Date/Time User Provider Type Action   03/14/2016 4:16 PM Ankit Lorie Phenix, MD Physician Cosign   03/14/2016 4:15 PM Danielle Gong, RN Rehab Admission Coordinator Sign

## 2016-03-14 NOTE — Care Management Note (Signed)
Case Management Note  Patient Details  Name: Danielle Vargas MRN: [id number] Date of Birth: [birth year]  Subjective/Objective:                    Action/Plan: Pt discharging to CIR. No further needs per CM.   Expected Discharge Date:                  Expected Discharge Plan:  [place]  In-House Referral:     Discharge planning Services     Post Acute Care Choice:    Choice offered to:     DME Arranged:    DME Agency:     HH Arranged:    [place] Agency:     Status of Service:  Completed, signed off  If discussed at [employer] of Stay Meetings, dates discussed:    Additional Comments:  Pollie Friar, RN 03/14/2016, 4:29 PM

## 2016-03-14 NOTE — Discharge Summary (Signed)
Physician Discharge Summary  Danielle Vargas [id number] DOB: 25-Nov-1950 DOA: 03/10/2016  PCP: Marton Redwood, MD  Admit date: 03/10/2016 Discharge date: 03/14/2016  Admitted From: Home Disposition:  CIR   Discharge Condition: Stable CODE STATUS: FULL Diet recommendation: Heart Healthy / Carb Modified  Brief/Interim Summary: 65 year old female, lives alone, PMH of DM, HLD, HTN, hypothyroid, CAD, OSA on CPAP, sustained 2 falls from her bed on 2 consecutive days Friday and Saturday and laid on the floor for several hours. On the first occasion, she felt that because of left shoulder weakness from prior surgery, she was unable to get up-was evaluated by EMS but refused to come to the ED. However after the second episode, she realized she was weak on the left side which was worse and presented to ED. Diagnosed with acute stroke. Neurology following. On the evening of 03/11/2016, the patient had increased slurred speech and drowsiness with questionable increased left-sided weakness. Repeat MRI of the brain on 03/12/2016 revealed mild extension of her right MCA infarct with increased cytotoxic edema.  As result, amlodipine, losartan were discontinued and coreg dose was decreased to allow for permissive HTN.  Pt was evaluated by PT whom recommened CIR.  PM&R was consulted and felt pt was appropriate to transfer to CIR.  Discharge Diagnoses:  Acute right brain stroke (nondominant right MCA territory infarct embolic secondary to unknown source) - Resultant left facial droop and left hemiparesis-better. - CT head 7/3: Acute right MCA infarct involving the insular cortex and adjacent right frontal and temporal lobes. No hemorrhage. - CT cervical spine 7/3: No acute bony abnormality of the cervical spine. - MRI head 03/10/16: Right MCA territory acute nonhemorrhagic infarction. Suspected right M1 MCA long segment stenosis or occlusion. - CTA head and neck 7/3: Severe stenosis of right MCA M2 superior  division vessel, corresponding with the observed areas of infarction. Developing nonhemorrhagic cytotoxic edema, right hemisphere, without significant mass effect. Nonstenotic extracranial carotid bifurcation disease. - 2-D echo 03/11/16: Very limited study. The left ventricle appears normal in overall size and function -TEE--EF 60-65%, no thrombotic source -03/12/2016--loop recorder placed -Venous duplex lower extremities--negative DVT - LDL 63 - A1c 7.3 - Was on aspirin 81 MG daily prior to admission, changed to clopidogrel 75 MG daily. -03/12/2016 repeat MRI brain--mild extension of right MCA infarct with increased cytotoxic edema due to orthostasis - Therapies recommend CIR-consulted -EKG personally reviewed--sinus rhythm, nonspecific T-wave changes  Essential hypertension - Allow for permissive hypertension.  -Decrease carvedilol 6.25 mg twice a day on 7/5 -can increase carvedilol back to 12.5 mg bid on 03/16/16 if BP remains elevated -Discontinue amlodipine and losartan   Hyperlipidemia - LDL 63, goal <70. Continue Crestor and fenofibrate  Hypothyroidism -TSH 10.113 -Increase Synthroid to 100 g daily -Repeat TSH in 1 month  Type II DM - A1c 7.3, goal <7.  - Patient follows with Dr. Benjiman Core, Endocrinologist. She is on high doses of insulin's at home and states that she takes Lantus 45 units twice a day and Humulin R 35-45 units 3 times daily before meals. - Initially was started on Lantus 22 units subcutaneously twice a day and NovoLog sensitive SSI without bedtime coverage. - Increased Lantus to 45 units twice daily -continue SSI to moderate  --Increase pre-meal NovoLog 15 units-->d/c on prior home dose novolog -restart metformin after d/c  OSA on CPAP - Continue.  Obesity/There is no weight on file to calculate BMI. - Needs diet, exercise and weight loss.  Hypothyroid -TSH 10.113 -increase synthroid to  100 mcg daily  Left-sided mid back pain/undisplaced  ninth rib fracture - Supportive treatment. Discussed with patient and updated.  Elevated troponin - Mildly elevated troponin without chest pain and flat trend.? Related to acute stroke. Continue Plavix, statins, beta blockers.   Morganella UTI - ceftriaxone-->cefpodoxime on 03/13/16 -will have finished 5 days of abx during the hospitalization  Chronic back pain - Continue home medications.   Discharge Instructions  Discharge Instructions    Ambulatory referral to Neurology    Complete by:  As directed   Pt will follow up with Dr. Erlinda Hong at [facility] in about 2 months. Thanks.            Medication List    STOP taking these medications        amLODipine 5 MG tablet  Commonly known as:  NORVASC     aspirin EC 81 MG tablet     losartan 50 MG tablet  Commonly known as:  COZAAR     pantoprazole 40 MG tablet  Commonly known as:  PROTONIX      TAKE these medications        albuterol 108 (90 Base) MCG/ACT inhaler  Commonly known as:  PROVENTIL HFA;VENTOLIN HFA  Inhale 2 puffs into the lungs every 6 (six) hours as needed for wheezing or shortness of breath.     budesonide-formoterol 160-4.5 MCG/ACT inhaler  Commonly known as:  SYMBICORT  Inhale 2 puffs into the lungs 2 (two) times daily as needed (shortness of breath).     calcium citrate-vitamin D 315-200 MG-UNIT tablet  Commonly known as:  CITRACAL+D  Take 1 tablet by mouth daily.     carvedilol 12.5 MG tablet  Commonly known as:  COREG  Take 1 tablet (12.5 mg total) by mouth 2 (two) times daily with a meal.     clopidogrel 75 MG tablet  Commonly known as:  PLAVIX  Take 1 tablet (75 mg total) by mouth daily.     Co Q 10 100 MG Caps  Take 100 mg by mouth 2 (two) times daily.     CRESTOR 20 MG tablet  Generic drug:  rosuvastatin  TAKE 1 TABLET EVERY DAY     fenofibrate 160 MG tablet  Take 160 mg by mouth at bedtime.     furosemide 20 MG tablet  Commonly known as:  LASIX  Take 1 tablet (20 mg total) by mouth every  morning.     gabapentin 300 MG capsule  Commonly known as:  NEURONTIN  Take 300 mg by mouth 3 (three) times daily.     HYDROcodone-acetaminophen 10-325 MG tablet  Commonly known as:  NORCO  Take 1 tablet by mouth 3 (three) times daily as needed (pain).     insulin glargine 100 UNIT/ML injection  Commonly known as:  LANTUS  Inject into the skin 45 units in the morning and 45 units at bedtime.     insulin regular 100 units/mL injection  Commonly known as:  HUMULIN R  Inject 0.35-0.45 mLs (35-45 Units total) into the skin 3 (three) times daily before meals.     INSULIN SYRINGE 1CC/31GX5/16" 31G X 5/16" 1 ML Misc  Use to inject insulin 5 times daily.     levothyroxine 75 MCG tablet  Commonly known as:  SYNTHROID, LEVOTHROID  Take 75 mcg by mouth daily.     metFORMIN 1000 MG tablet  Commonly known as:  GLUCOPHAGE  Take 1,000 mg by mouth 2 (two) times daily with a meal.  metoCLOPramide 10 MG tablet  Commonly known as:  REGLAN  Take 10 mg by mouth 4 (four) times daily.     mometasone 50 MCG/ACT nasal spray  Commonly known as:  NASONEX  Place 2 sprays into the nose daily.     montelukast 10 MG tablet  Commonly known as:  SINGULAIR  Take 10 mg by mouth at bedtime.     multivitamin with minerals Tabs tablet  Take 1 tablet by mouth daily. One A Day 50+     nitroGLYCERIN 0.4 MG SL tablet  Commonly known as:  NITROSTAT  Place 1 tablet (0.4 mg total) under the tongue every 5 (five) minutes as needed for chest pain.     omeprazole 20 MG capsule  Commonly known as:  PRILOSEC  Take 20 mg by mouth 2 (two) times daily before a meal.     polyethylene glycol packet  Commonly known as:  MIRALAX / GLYCOLAX  Take 17 g by mouth every other day.     tiZANidine 2 MG tablet  Commonly known as:  ZANAFLEX  Take 2 mg by mouth every 8 (eight) hours as needed for muscle spasms.     topiramate 50 MG tablet  Commonly known as:  TOPAMAX  Take 50 mg by mouth 2 (two) times daily.      Vitamin D (Ergocalciferol) 50000 units Caps capsule  Commonly known as:  DRISDOL  Take 50,000 Units by mouth every 7 (seven) days. Thursdays           Follow-up Information    Follow up with [facility] On 03/24/2016.   Specialty:  Cardiology   Why:  at 2:30PM for wound check    Contact information:   [address], [place] [phone]      Follow up with Xu,Jindong, MD. Schedule an appointment as soon as possible for a visit in 2 months.   Specialty:  Neurology   Why:  stroke clinic   Contact information:   [address] [place] [address] [phone]      Allergies  Allergen Reactions  . Invokana [Canagliflozin] Diarrhea  . Nsaids Other (See Comments)    GI problems/ stomach pain  . Penicillins Rash    Has patient had a PCN reaction causing immediate rash, facial/tongue/throat swelling, SOB or lightheadedness with hypotension: No (rash on legs after 3 days [place] -June 2016) Has patient had a PCN reaction causing severe rash involving mucus membranes or skin necrosis: No Has patient had a PCN reaction that required hospitalization No Has patient had a PCN reaction occurring within the last 10 years: Yes If all of the above answers are "NO", then may proceed with Cephalosporin use.    Consultations:  Neurology  PM&R   Procedures/Studies: Ct Angio Head W Or Wo Contrast  03/11/2016  CLINICAL DATA:  Continued surveillance RIGHT hemisphere infarction. LEFT hemiparesis. EXAM: CT ANGIOGRAPHY HEAD AND NECK TECHNIQUE: Multidetector CT imaging of the head and neck was performed using the standard protocol during bolus administration of intravenous contrast. Multiplanar CT image reconstructions and MIPs were obtained to evaluate the vascular anatomy. Carotid stenosis measurements (when applicable) are obtained utilizing NASCET criteria, using the distal internal carotid diameter as the denominator.  CONTRAST:  Isovue 370, 50 mL. COMPARISON:  MRI brain 03/10/2016.  Noncontrast CT head 03/10/2016. FINDINGS: CT HEAD Calvarium and skull base: No fracture or destructive lesion. Mastoids and middle ears are grossly clear. Paranasal sinuses: Imaged  portions are clear. Orbits: Negative. Brain: No hemorrhage is seen. Cytotoxic edema is now developing in the areas of restricted diffusion noted on MR. No hemorrhagic transformation. No midline shift. Chronic atrophic change, stable. White matter hypoattenuation favored to represent small vessel disease. CTA NECK Aortic arch: Standard branching. Imaged portion shows no evidence of aneurysm or dissection. No significant stenosis of the major arch vessel origins. Right carotid system: Calcific plaque at the bifurcation extending into the RIGHT ICA. No evidence of dissection, stenosis (50% or greater) or occlusion. Left carotid system: Calcific plaque at the bifurcation, extending into the LEFT ICA No evidence of dissection, stenosis (50% or greater) or occlusion. Vertebral arteries: LEFT vertebral dominant. RIGHT vertebral diminutive no stenosis in the neck. No evidence of dissection, stenosis (50% or greater) or occlusion. Nonvascular soft tissues: No masses. Spondylosis. No lung apex lesion. Mediastinal lipomatosis reflecting increased body habitus. Previous cervical fusion [id number] appears uncomplicated. CTA HEAD Anterior circulation: No significant cavernous ICA disease. LEFT anterior circulation normal. On the RIGHT, there is an early bifurcation of the M1 MCA. There is severe stenosis of the superior M2 segment corresponding to the areas of acute infarction. See image 228 series 402. The inferior M2 segment demonstrates mild stenosis at its origin, probably not flow reducing. No aneurysm, or vascular malformation. Posterior circulation: LEFT vertebral dominant. RIGHT vertebral has a diminutive connection to the basilar, but primarily supplies the RIGHT PICA. No significant  stenosis, proximal occlusion, aneurysm, or vascular malformation. Venous sinuses: As permitted by contrast timing, patent. Anatomic variants: None of significance. Delayed phase:   No abnormal intracranial enhancement. IMPRESSION: Non stenotic extracranial carotid bifurcation disease, consisting of calcific plaque. Severe stenosis of the RIGHT MCA M2 superior division vessel, corresponding with the observed areas of infarction. Developing nonhemorrhagic cytotoxic edema, RIGHT hemisphere, without significant mass effect. Electronically Signed   By: Staci Righter M.D.   On: 03/11/2016 09:29   Dg Chest 2 View  03/10/2016  CLINICAL DATA:  Left lower chest pain beginning after landing on left side for 6 hours. Weakness. EXAM: CHEST  2 VIEW COMPARISON:  11/10/2015 FINDINGS: Lungs are adequately inflated without focal consolidation or effusion. Stable cardiomegaly. There are degenerative changes of the spine. Partially visualized fusion hardware over the cervical spine intact. Surgical change of the left AC joint. Prominent overlying soft tissues. IMPRESSION: No active cardiopulmonary disease. Stable cardiomegaly. Electronically Signed   By: Marin Olp M.D.   On: 03/10/2016 17:25   Dg Ribs Unilateral Left  03/11/2016  CLINICAL DATA:  Fall.  Left side rib pain and back pain. EXAM: LEFT RIBS - 2 VIEW COMPARISON:  11/10/2015 FINDINGS: Acute left anterior ninth rib fracture deformity identified. No additional fractures or subluxations. IMPRESSION: 1. Left ninth rib fracture. Electronically Signed   By: Kerby Moors M.D.   On: 03/11/2016 12:54   Dg Thoracic Spine W/swimmers  03/11/2016  CLINICAL DATA:  65 year old female with a history of fall and rib pain EXAM: THORACIC SPINE - 3 VIEWS COMPARISON:  None. FINDINGS: Thoracic Spine: Thoracic vertebral elements maintain normal anatomic alignment, with no evidence of anterolisthesis, retrolisthesis, or subluxation. No acute fracture line identified. Vertebral body  heights maintained. Mild degenerative disc disease. Unremarkable appearance of the visualized thorax. Surgical changes of prior anterior cervical discectomy infusion of the lower cervical region. IMPRESSION: No radiographic evidence of acute fracture or malalignment of thoracic spine. No displaced rib fracture identified on this plain film series. Signed, Dulcy Fanny. Earleen Newport, DO [facility]  Electronically Signed   By: Corrie Mckusick D.O.   On: 03/11/2016 12:58   Ct Head Wo Contrast  03/11/2016  CLINICAL DATA:  Slurred speech and increase in left-sided weakness EXAM: CT HEAD WITHOUT CONTRAST TECHNIQUE: Contiguous axial images were obtained from the base of the skull through the vertex without intravenous contrast. COMPARISON:  Head CT March 10, 2016; brain MRI March 10, 2016; contrast-enhanced head CT March 11, 2016 FINDINGS: Brain: The ventricles are normal in size and configuration for age. There is again noted an infarct in the right middle cerebral artery distribution involving portions of the posterior right frontal lobe, much of the superior right temporal lobe, can the right superior temporal -anterior parietal junction. This infarct involves the external capsule, claustrum, extreme capsule, and much of the insular cortex on the right. There is slightly more edema superiorly along the right posterior corona radiata compared to earlier in the day. Elsewhere, there is stable periventricular small vessel disease. No new infarct is evident. There is no mass, hemorrhage, or extra-axial fluid collection. Vascular: Calcification in both cavernous carotid artery regions is stable. Skull: Bony calvarium appears intact. Sinuses/Orbits: Orbits appear symmetric bilaterally. Paranasal sinuses are clear. Other: Mastoid air cells are clear. IMPRESSION: Right middle cerebral artery distribution infarct again noted with slightly more edema superiorly and posteriorly compared to  earlier in the day. No new infarct evident. There is stable periventricular small vessel disease. No hemorrhage. There is no mass effect beyond the cytotoxic edema noted in the area of acute infarct. No extra-axial fluid collection. Electronically Signed   By: Lowella Grip III M.D.   On: 03/11/2016 21:02   Ct Head Wo Contrast  03/10/2016  CLINICAL DATA:  Fall, left-sided weakness. EXAM: CT HEAD WITHOUT CONTRAST CT CERVICAL SPINE WITHOUT CONTRAST TECHNIQUE: Multidetector CT imaging of the head and cervical spine was performed following the standard protocol without intravenous contrast. Multiplanar CT image reconstructions of the cervical spine were also generated. COMPARISON:  MRI brain 06/10/2006 FINDINGS: CT HEAD FINDINGS There is low-density throughout the insular cortex and adjacent temporal lobe and frontal lobe on the right compatible with acute infarct. No hemorrhage. No hydrocephalus or midline shift. No acute calvarial abnormality. CT CERVICAL SPINE FINDINGS Prior anterior fusion from C5-C7. Degenerative changes at see C4-5 and C7-T1. No fracture. No malalignment. Prevertebral soft tissues are normal. No epidural or paraspinal hematoma. IMPRESSION: Acute right MCA infarct involving the insular cortex and adjacent right frontal and temporal lobes. No hemorrhage. No acute bony abnormality in the cervical spine. Critical Value/emergent results were called by telephone at the time of interpretation on 03/10/2016 at 2:04 pm to Dr. Theotis Burrow , who verbally acknowledged these results. Electronically Signed   By: Rolm Baptise M.D.   On: 03/10/2016 14:04   Ct Angio Neck W Or Wo Contrast  03/11/2016  CLINICAL DATA:  Continued surveillance RIGHT hemisphere infarction. LEFT hemiparesis. EXAM: CT ANGIOGRAPHY HEAD AND NECK TECHNIQUE: Multidetector CT imaging of the head and neck was performed using the standard protocol during bolus administration of intravenous contrast. Multiplanar CT image reconstructions  and MIPs were obtained to evaluate the vascular anatomy. Carotid stenosis measurements (when applicable) are obtained utilizing NASCET criteria, using the distal internal carotid diameter as the denominator. CONTRAST:  Isovue 370, 50 mL. COMPARISON:  MRI brain 03/10/2016.  Noncontrast CT head 03/10/2016. FINDINGS: CT HEAD Calvarium and skull base: No fracture or destructive lesion. Mastoids and middle ears are grossly clear. Paranasal sinuses: Imaged portions are clear. Orbits: Negative. Brain:  No hemorrhage is seen. Cytotoxic edema is now developing in the areas of restricted diffusion noted on MR. No hemorrhagic transformation. No midline shift. Chronic atrophic change, stable. White matter hypoattenuation favored to represent small vessel disease. CTA NECK Aortic arch: Standard branching. Imaged portion shows no evidence of aneurysm or dissection. No significant stenosis of the major arch vessel origins. Right carotid system: Calcific plaque at the bifurcation extending into the RIGHT ICA. No evidence of dissection, stenosis (50% or greater) or occlusion. Left carotid system: Calcific plaque at the bifurcation, extending into the LEFT ICA No evidence of dissection, stenosis (50% or greater) or occlusion. Vertebral arteries: LEFT vertebral dominant. RIGHT vertebral diminutive no stenosis in the neck. No evidence of dissection, stenosis (50% or greater) or occlusion. Nonvascular soft tissues: No masses. Spondylosis. No lung apex lesion. Mediastinal lipomatosis reflecting increased body habitus. Previous cervical fusion Q000111Q appears uncomplicated. CTA HEAD Anterior circulation: No significant cavernous ICA disease. LEFT anterior circulation normal. On the RIGHT, there is an early bifurcation of the M1 MCA. There is severe stenosis of the superior M2 segment corresponding to the areas of acute infarction. See image 228 series 402. The inferior M2 segment demonstrates mild stenosis at its origin, probably not flow  reducing. No aneurysm, or vascular malformation. Posterior circulation: LEFT vertebral dominant. RIGHT vertebral has a diminutive connection to the basilar, but primarily supplies the RIGHT PICA. No significant stenosis, proximal occlusion, aneurysm, or vascular malformation. Venous sinuses: As permitted by contrast timing, patent. Anatomic variants: None of significance. Delayed phase:   No abnormal intracranial enhancement. IMPRESSION: Non stenotic extracranial carotid bifurcation disease, consisting of calcific plaque. Severe stenosis of the RIGHT MCA M2 superior division vessel, corresponding with the observed areas of infarction. Developing nonhemorrhagic cytotoxic edema, RIGHT hemisphere, without significant mass effect. Electronically Signed   By: Staci Righter M.D.   On: 03/11/2016 09:29   Ct Cervical Spine Wo Contrast  03/10/2016  CLINICAL DATA:  Fall, left-sided weakness. EXAM: CT HEAD WITHOUT CONTRAST CT CERVICAL SPINE WITHOUT CONTRAST TECHNIQUE: Multidetector CT imaging of the head and cervical spine was performed following the standard protocol without intravenous contrast. Multiplanar CT image reconstructions of the cervical spine were also generated. COMPARISON:  MRI brain 06/10/2006 FINDINGS: CT HEAD FINDINGS There is low-density throughout the insular cortex and adjacent temporal lobe and frontal lobe on the right compatible with acute infarct. No hemorrhage. No hydrocephalus or midline shift. No acute calvarial abnormality. CT CERVICAL SPINE FINDINGS Prior anterior fusion from C5-C7. Degenerative changes at see C4-5 and C7-T1. No fracture. No malalignment. Prevertebral soft tissues are normal. No epidural or paraspinal hematoma. IMPRESSION: Acute right MCA infarct involving the insular cortex and adjacent right frontal and temporal lobes. No hemorrhage. No acute bony abnormality in the cervical spine. Critical Value/emergent results were called by telephone at the time of interpretation on  03/10/2016 at 2:04 pm to Dr. Theotis Burrow , who verbally acknowledged these results. Electronically Signed   By: Rolm Baptise M.D.   On: 03/10/2016 14:04   Mr Brain Wo Contrast  03/10/2016  CLINICAL DATA:  Left-sided weakness for at least 2 days. Stroke risk factors include diabetes, hypertension, dyslipidemia, and previous stroke. EXAM: MRI HEAD WITHOUT CONTRAST TECHNIQUE: Multiplanar, multiecho pulse sequences of the brain and surrounding structures were obtained without intravenous contrast. COMPARISON:  CT head performed earlier today. FINDINGS: Extensive areas of both focal and confluent restricted diffusion throughout the RIGHT hemisphere involving the frontal, temporal, insular, and parietal regions, largely RIGHT MCA territory, consistent with  acute infarction. No visible hemorrhage, mass lesion, hydrocephalus, or extra-axial fluid. Premature for age cerebral and cerebellar atrophy. Mild subcortical and periventricular T2 and FLAIR hyperintensities, likely chronic microvascular ischemic change. Remote LEFT insular cortex infarct. Old LEFT cerebellar infarct. No chronic hemorrhage. Flow voids are maintained in the internal carotid arteries, basilar artery, and LEFT vertebral artery. RIGHT vertebral is diminutive. There is poor flow related enhancement in the RIGHT M1 MCA, representing either long segment stenosis or occlusion. No significant midline shift. Extracranial soft tissues unremarkable.  No midline abnormality. IMPRESSION: Extensive areas of both focal and confluent restricted diffusion throughout the RIGHT MCA distribution representing acute nonhemorrhagic infarction. Suspected RIGHT M1 MCA long segment stenosis or occlusion. Atrophy with small vessel disease. Areas of chronic infarction, most notable LEFT insula and LEFT cerebellum. Electronically Signed   By: Staci Righter M.D.   On: 03/10/2016 19:25   Mr Brain Ltd W/o Cm  03/12/2016  CLINICAL DATA:  65 year old female with abrupt onset left  side weakness beginning overnight July 1st-2nd, slid out of bed and was found down by her daughter 6 hours later. Right MCA infarct. Initial encounter. EXAM: MRI HEAD WITHOUT CONTRAST TECHNIQUE: Multiplanar, multiecho pulse sequences of the brain and surrounding structures were obtained without intravenous contrast. COMPARISON:  Brain MRI 03/10/2016 and other recent studies. FINDINGS: The examination had to be discontinued prior to completion due to patient condition ; refusal to complete. Today images are also mildly degraded by motion despite repeated imaging attempts. Major intracranial vascular flow voids are stable. Increased volume of restricted diffusion in the right MCA territory, centered on the insula and operculum as before. The right basal ganglia remain spared. More of the posterior right temporal lobe and lateral right peri-Rolandic cortex is affected now. Associated increased T2 and FLAIR signal in a cytotoxic edema pattern. Mildly increased mass effect on the right lateral ventricle, but no evidence of associated hemorrhage. No midline shift. No ventriculomegaly. No contralateral or posterior fossa restricted diffusion. Stable gray and white matter signal outside of the acutely affected parenchyma. Small chronic left cerebellar infarcts again noted. Small chronic anterior left insula infarct again noted. Occasional other small chronic cortically based infarcts in both hemispheres were better demonstrated on the prior MRI due to motion today. Visualized paranasal sinuses and mastoids are stable and well pneumatized. Stable orbit and scalp soft tissues. IMPRESSION: 1. Mild extension of the acute right MCA infarct since 03/10/2016. Increased cytotoxic edema, but no associated hemorrhage or significant intracranial mass effect at this time. 2. No new intracranial abnormality. Electronically Signed   By: Genevie Ann M.D.   On: 03/12/2016 16:29         Discharge Exam: Filed Vitals:   03/14/16 0628  03/14/16 0909  BP: 165/54 151/81  Pulse: 80 90  Temp: 98.1 F (36.7 C) 98 F (36.7 C)  Resp: 20 18   Filed Vitals:   03/14/16 0216 03/14/16 0628 03/14/16 0858 03/14/16 0909  BP: 191/83 165/54  151/81  Pulse: 87 80  90  Temp: 98.6 F (37 C) 98.1 F (36.7 C)  98 F (36.7 C)  TempSrc: Oral Oral  Oral  Resp: 20 20  18   Height:      Weight:      SpO2: 98% 97% 95% 97%    General: Pt is alert, awake, not in acute distress Cardiovascular: RRR, S1/S2 +, no rubs, no gallops Respiratory: CTA bilaterally, no wheezing, no rhonchi Abdominal: Soft, NT, ND, bowel sounds + Extremities: no edema, no cyanosis  The results of significant diagnostics from this hospitalization (including imaging, microbiology, ancillary and laboratory) are listed below for reference.    Significant Diagnostic Studies: Ct Angio Head W Or Wo Contrast  03/11/2016  CLINICAL DATA:  Continued surveillance RIGHT hemisphere infarction. LEFT hemiparesis. EXAM: CT ANGIOGRAPHY HEAD AND NECK TECHNIQUE: Multidetector CT imaging of the head and neck was performed using the standard protocol during bolus administration of intravenous contrast. Multiplanar CT image reconstructions and MIPs were obtained to evaluate the vascular anatomy. Carotid stenosis measurements (when applicable) are obtained utilizing NASCET criteria, using the distal internal carotid diameter as the denominator. CONTRAST:  Isovue 370, 50 mL. COMPARISON:  MRI brain 03/10/2016.  Noncontrast CT head 03/10/2016. FINDINGS: CT HEAD Calvarium and skull base: No fracture or destructive lesion. Mastoids and middle ears are grossly clear. Paranasal sinuses: Imaged portions are clear. Orbits: Negative. Brain: No hemorrhage is seen. Cytotoxic edema is now developing in the areas of restricted diffusion noted on MR. No hemorrhagic transformation. No midline shift. Chronic atrophic change, stable. White matter hypoattenuation favored to represent small vessel disease. CTA NECK  Aortic arch: Standard branching. Imaged portion shows no evidence of aneurysm or dissection. No significant stenosis of the major arch vessel origins. Right carotid system: Calcific plaque at the bifurcation extending into the RIGHT ICA. No evidence of dissection, stenosis (50% or greater) or occlusion. Left carotid system: Calcific plaque at the bifurcation, extending into the LEFT ICA No evidence of dissection, stenosis (50% or greater) or occlusion. Vertebral arteries: LEFT vertebral dominant. RIGHT vertebral diminutive no stenosis in the neck. No evidence of dissection, stenosis (50% or greater) or occlusion. Nonvascular soft tissues: No masses. Spondylosis. No lung apex lesion. Mediastinal lipomatosis reflecting increased body habitus. Previous cervical fusion [id number] appears uncomplicated. CTA HEAD Anterior circulation: No significant cavernous ICA disease. LEFT anterior circulation normal. On the RIGHT, there is an early bifurcation of the M1 MCA. There is severe stenosis of the superior M2 segment corresponding to the areas of acute infarction. See image 228 series 402. The inferior M2 segment demonstrates mild stenosis at its origin, probably not flow reducing. No aneurysm, or vascular malformation. Posterior circulation: LEFT vertebral dominant. RIGHT vertebral has a diminutive connection to the basilar, but primarily supplies the RIGHT PICA. No significant stenosis, proximal occlusion, aneurysm, or vascular malformation. Venous sinuses: As permitted by contrast timing, patent. Anatomic variants: None of significance. Delayed phase:   No abnormal intracranial enhancement. IMPRESSION: Non stenotic extracranial carotid bifurcation disease, consisting of calcific plaque. Severe stenosis of the RIGHT MCA M2 superior division vessel, corresponding with the observed areas of infarction. Developing nonhemorrhagic cytotoxic edema, RIGHT hemisphere, without significant mass effect. Electronically Signed   By: Staci Righter M.D.   On: 03/11/2016 09:29   Dg Chest 2 View  03/10/2016  CLINICAL DATA:  Left lower chest pain beginning after landing on left side for 6 hours. Weakness. EXAM: CHEST  2 VIEW COMPARISON:  11/10/2015 FINDINGS: Lungs are adequately inflated without focal consolidation or effusion. Stable cardiomegaly. There are degenerative changes of the spine. Partially visualized fusion hardware over the cervical spine intact. Surgical change of the left AC joint. Prominent overlying soft tissues. IMPRESSION: No active cardiopulmonary disease. Stable cardiomegaly. Electronically Signed   By: Marin Olp M.D.   On: 03/10/2016 17:25   Dg Ribs Unilateral Left  03/11/2016  CLINICAL DATA:  Fall.  Left side rib pain and back pain. EXAM: LEFT RIBS - 2 VIEW COMPARISON:  11/10/2015 FINDINGS: Acute left anterior ninth rib fracture  deformity identified. No additional fractures or subluxations. IMPRESSION: 1. Left ninth rib fracture. Electronically Signed   By: Kerby Moors M.D.   On: 03/11/2016 12:54   Dg Thoracic Spine W/swimmers  03/11/2016  CLINICAL DATA:  65 year old female with a history of fall and rib pain EXAM: THORACIC SPINE - 3 VIEWS COMPARISON:  None. FINDINGS: Thoracic Spine: Thoracic vertebral elements maintain normal anatomic alignment, with no evidence of anterolisthesis, retrolisthesis, or subluxation. No acute fracture line identified. Vertebral body heights maintained. Mild degenerative disc disease. Unremarkable appearance of the visualized thorax. Surgical changes of prior anterior cervical discectomy infusion of the lower cervical region. IMPRESSION: No radiographic evidence of acute fracture or malalignment of thoracic spine. No displaced rib fracture identified on this plain film series. Signed, Dulcy Fanny. Earleen Newport, DO [facility] Electronically Signed   By: Corrie Mckusick D.O.   On: 03/11/2016 12:58   Ct Head Wo Contrast  03/11/2016  CLINICAL  DATA:  Slurred speech and increase in left-sided weakness EXAM: CT HEAD WITHOUT CONTRAST TECHNIQUE: Contiguous axial images were obtained from the base of the skull through the vertex without intravenous contrast. COMPARISON:  Head CT March 10, 2016; brain MRI March 10, 2016; contrast-enhanced head CT March 11, 2016 FINDINGS: Brain: The ventricles are normal in size and configuration for age. There is again noted an infarct in the right middle cerebral artery distribution involving portions of the posterior right frontal lobe, much of the superior right temporal lobe, can the right superior temporal -anterior parietal junction. This infarct involves the external capsule, claustrum, extreme capsule, and much of the insular cortex on the right. There is slightly more edema superiorly along the right posterior corona radiata compared to earlier in the day. Elsewhere, there is stable periventricular small vessel disease. No new infarct is evident. There is no mass, hemorrhage, or extra-axial fluid collection. Vascular: Calcification in both cavernous carotid artery regions is stable. Skull: Bony calvarium appears intact. Sinuses/Orbits: Orbits appear symmetric bilaterally. Paranasal sinuses are clear. Other: Mastoid air cells are clear. IMPRESSION: Right middle cerebral artery distribution infarct again noted with slightly more edema superiorly and posteriorly compared to earlier in the day. No new infarct evident. There is stable periventricular small vessel disease. No hemorrhage. There is no mass effect beyond the cytotoxic edema noted in the area of acute infarct. No extra-axial fluid collection. Electronically Signed   By: Lowella Grip III M.D.   On: 03/11/2016 21:02   Ct Head Wo Contrast  03/10/2016  CLINICAL DATA:  Fall, left-sided weakness. EXAM: CT HEAD WITHOUT CONTRAST CT CERVICAL SPINE WITHOUT CONTRAST TECHNIQUE: Multidetector CT imaging of the head and cervical spine was performed following the standard  protocol without intravenous contrast. Multiplanar CT image reconstructions of the cervical spine were also generated. COMPARISON:  MRI brain 06/10/2006 FINDINGS: CT HEAD FINDINGS There is low-density throughout the insular cortex and adjacent temporal lobe and frontal lobe on the right compatible with acute infarct. No hemorrhage. No hydrocephalus or midline shift. No acute calvarial abnormality. CT CERVICAL SPINE FINDINGS Prior anterior fusion from C5-C7. Degenerative changes at see C4-5 and C7-T1. No fracture. No malalignment. Prevertebral soft tissues are normal. No epidural or paraspinal hematoma. IMPRESSION: Acute right MCA infarct involving the insular cortex and adjacent right frontal and temporal lobes. No hemorrhage. No acute bony abnormality in the cervical spine. Critical Value/emergent results were called by telephone at the time of interpretation on 03/10/2016 at 2:04 pm to Dr. Theotis Burrow , who verbally  acknowledged these results. Electronically Signed   By: Rolm Baptise M.D.   On: 03/10/2016 14:04   Ct Angio Neck W Or Wo Contrast  03/11/2016  CLINICAL DATA:  Continued surveillance RIGHT hemisphere infarction. LEFT hemiparesis. EXAM: CT ANGIOGRAPHY HEAD AND NECK TECHNIQUE: Multidetector CT imaging of the head and neck was performed using the standard protocol during bolus administration of intravenous contrast. Multiplanar CT image reconstructions and MIPs were obtained to evaluate the vascular anatomy. Carotid stenosis measurements (when applicable) are obtained utilizing NASCET criteria, using the distal internal carotid diameter as the denominator. CONTRAST:  Isovue 370, 50 mL. COMPARISON:  MRI brain 03/10/2016.  Noncontrast CT head 03/10/2016. FINDINGS: CT HEAD Calvarium and skull base: No fracture or destructive lesion. Mastoids and middle ears are grossly clear. Paranasal sinuses: Imaged portions are clear. Orbits: Negative. Brain: No hemorrhage is seen. Cytotoxic edema is now developing in  the areas of restricted diffusion noted on MR. No hemorrhagic transformation. No midline shift. Chronic atrophic change, stable. White matter hypoattenuation favored to represent small vessel disease. CTA NECK Aortic arch: Standard branching. Imaged portion shows no evidence of aneurysm or dissection. No significant stenosis of the major arch vessel origins. Right carotid system: Calcific plaque at the bifurcation extending into the RIGHT ICA. No evidence of dissection, stenosis (50% or greater) or occlusion. Left carotid system: Calcific plaque at the bifurcation, extending into the LEFT ICA No evidence of dissection, stenosis (50% or greater) or occlusion. Vertebral arteries: LEFT vertebral dominant. RIGHT vertebral diminutive no stenosis in the neck. No evidence of dissection, stenosis (50% or greater) or occlusion. Nonvascular soft tissues: No masses. Spondylosis. No lung apex lesion. Mediastinal lipomatosis reflecting increased body habitus. Previous cervical fusion Q000111Q appears uncomplicated. CTA HEAD Anterior circulation: No significant cavernous ICA disease. LEFT anterior circulation normal. On the RIGHT, there is an early bifurcation of the M1 MCA. There is severe stenosis of the superior M2 segment corresponding to the areas of acute infarction. See image 228 series 402. The inferior M2 segment demonstrates mild stenosis at its origin, probably not flow reducing. No aneurysm, or vascular malformation. Posterior circulation: LEFT vertebral dominant. RIGHT vertebral has a diminutive connection to the basilar, but primarily supplies the RIGHT PICA. No significant stenosis, proximal occlusion, aneurysm, or vascular malformation. Venous sinuses: As permitted by contrast timing, patent. Anatomic variants: None of significance. Delayed phase:   No abnormal intracranial enhancement. IMPRESSION: Non stenotic extracranial carotid bifurcation disease, consisting of calcific plaque. Severe stenosis of the RIGHT MCA  M2 superior division vessel, corresponding with the observed areas of infarction. Developing nonhemorrhagic cytotoxic edema, RIGHT hemisphere, without significant mass effect. Electronically Signed   By: Staci Righter M.D.   On: 03/11/2016 09:29   Ct Cervical Spine Wo Contrast  03/10/2016  CLINICAL DATA:  Fall, left-sided weakness. EXAM: CT HEAD WITHOUT CONTRAST CT CERVICAL SPINE WITHOUT CONTRAST TECHNIQUE: Multidetector CT imaging of the head and cervical spine was performed following the standard protocol without intravenous contrast. Multiplanar CT image reconstructions of the cervical spine were also generated. COMPARISON:  MRI brain 06/10/2006 FINDINGS: CT HEAD FINDINGS There is low-density throughout the insular cortex and adjacent temporal lobe and frontal lobe on the right compatible with acute infarct. No hemorrhage. No hydrocephalus or midline shift. No acute calvarial abnormality. CT CERVICAL SPINE FINDINGS Prior anterior fusion from C5-C7. Degenerative changes at see C4-5 and C7-T1. No fracture. No malalignment. Prevertebral soft tissues are normal. No epidural or paraspinal hematoma. IMPRESSION: Acute right MCA infarct involving the insular cortex and  adjacent right frontal and temporal lobes. No hemorrhage. No acute bony abnormality in the cervical spine. Critical Value/emergent results were called by telephone at the time of interpretation on 03/10/2016 at 2:04 pm to Dr. Theotis Burrow , who verbally acknowledged these results. Electronically Signed   By: Rolm Baptise M.D.   On: 03/10/2016 14:04   Mr Brain Wo Contrast  03/10/2016  CLINICAL DATA:  Left-sided weakness for at least 2 days. Stroke risk factors include diabetes, hypertension, dyslipidemia, and previous stroke. EXAM: MRI HEAD WITHOUT CONTRAST TECHNIQUE: Multiplanar, multiecho pulse sequences of the brain and surrounding structures were obtained without intravenous contrast. COMPARISON:  CT head performed earlier today. FINDINGS: Extensive  areas of both focal and confluent restricted diffusion throughout the RIGHT hemisphere involving the frontal, temporal, insular, and parietal regions, largely RIGHT MCA territory, consistent with acute infarction. No visible hemorrhage, mass lesion, hydrocephalus, or extra-axial fluid. Premature for age cerebral and cerebellar atrophy. Mild subcortical and periventricular T2 and FLAIR hyperintensities, likely chronic microvascular ischemic change. Remote LEFT insular cortex infarct. Old LEFT cerebellar infarct. No chronic hemorrhage. Flow voids are maintained in the internal carotid arteries, basilar artery, and LEFT vertebral artery. RIGHT vertebral is diminutive. There is poor flow related enhancement in the RIGHT M1 MCA, representing either long segment stenosis or occlusion. No significant midline shift. Extracranial soft tissues unremarkable.  No midline abnormality. IMPRESSION: Extensive areas of both focal and confluent restricted diffusion throughout the RIGHT MCA distribution representing acute nonhemorrhagic infarction. Suspected RIGHT M1 MCA long segment stenosis or occlusion. Atrophy with small vessel disease. Areas of chronic infarction, most notable LEFT insula and LEFT cerebellum. Electronically Signed   By: Staci Righter M.D.   On: 03/10/2016 19:25   Mr Brain Ltd W/o Cm  03/12/2016  CLINICAL DATA:  65 year old female with abrupt onset left side weakness beginning overnight July 1st-2nd, slid out of bed and was found down by her daughter 6 hours later. Right MCA infarct. Initial encounter. EXAM: MRI HEAD WITHOUT CONTRAST TECHNIQUE: Multiplanar, multiecho pulse sequences of the brain and surrounding structures were obtained without intravenous contrast. COMPARISON:  Brain MRI 03/10/2016 and other recent studies. FINDINGS: The examination had to be discontinued prior to completion due to patient condition ; refusal to complete. Today images are also mildly degraded by motion despite repeated imaging  attempts. Major intracranial vascular flow voids are stable. Increased volume of restricted diffusion in the right MCA territory, centered on the insula and operculum as before. The right basal ganglia remain spared. More of the posterior right temporal lobe and lateral right peri-Rolandic cortex is affected now. Associated increased T2 and FLAIR signal in a cytotoxic edema pattern. Mildly increased mass effect on the right lateral ventricle, but no evidence of associated hemorrhage. No midline shift. No ventriculomegaly. No contralateral or posterior fossa restricted diffusion. Stable gray and white matter signal outside of the acutely affected parenchyma. Small chronic left cerebellar infarcts again noted. Small chronic anterior left insula infarct again noted. Occasional other small chronic cortically based infarcts in both hemispheres were better demonstrated on the prior MRI due to motion today. Visualized paranasal sinuses and mastoids are stable and well pneumatized. Stable orbit and scalp soft tissues. IMPRESSION: 1. Mild extension of the acute right MCA infarct since 03/10/2016. Increased cytotoxic edema, but no associated hemorrhage or significant intracranial mass effect at this time. 2. No new intracranial abnormality. Electronically Signed   By: Genevie Ann M.D.   On: 03/12/2016 16:29     Microbiology: Recent Results (from  the past 240 hour(s))  Culture, Urine     Status: Abnormal   Collection Time: 03/10/16  3:00 PM  Result Value Ref Range Status   Specimen Description URINE, CATHETERIZED  Final   Special Requests NONE  Final   Culture >=100,000 COLONIES/mL MORGANELLA MORGANII (A)  Final   Report Status 03/13/2016 FINAL  Final   Organism ID, Bacteria MORGANELLA MORGANII (A)  Final      Susceptibility   Morganella morganii - MIC*    AMPICILLIN >=32 RESISTANT Resistant     CEFAZOLIN >=64 RESISTANT Resistant     CEFTRIAXONE <=1 SENSITIVE Sensitive     CIPROFLOXACIN <=0.25 SENSITIVE  Sensitive     GENTAMICIN <=1 SENSITIVE Sensitive     IMIPENEM 4 SENSITIVE Sensitive     NITROFURANTOIN 128 RESISTANT Resistant     TRIMETH/SULFA <=20 SENSITIVE Sensitive     AMPICILLIN/SULBACTAM 16 INTERMEDIATE Intermediate     PIP/TAZO <=4 SENSITIVE Sensitive     * >=100,000 COLONIES/mL MORGANELLA MORGANII     Labs: Basic Metabolic Panel:  Recent Labs Lab 03/10/16 1356 03/11/16 0507 03/13/16 0519  NA 137 139 141  K 3.8 3.8 3.7  CL 109 110 112*  CO2 20* 21* 20*  GLUCOSE 266* 203* 155*  BUN 24* 20 14  CREATININE 1.14* 1.07* 0.85  CALCIUM 9.5 9.2 9.2   Liver Function Tests:  Recent Labs Lab 03/10/16 1356  AST 38  ALT 30  ALKPHOS 40  BILITOT 0.8  PROT 6.6  ALBUMIN 3.6   No results for input(s): LIPASE, AMYLASE in the last 168 hours. No results for input(s): AMMONIA in the last 168 hours. CBC:  Recent Labs Lab 03/10/16 1356 03/11/16 0507  WBC 9.7 7.2  NEUTROABS 6.7  --   HGB 12.1 12.2  HCT 38.5 39.0  MCV 87.7 89.0  PLT 293 272   Cardiac Enzymes:  Recent Labs Lab 03/10/16 1356 03/10/16 1648 03/10/16 2310 03/11/16 0507  CKTOTAL 357*  --   --   --   TROPONINI  --  0.10* 0.09* 0.08*   BNP: Invalid input(s): POCBNP CBG:  Recent Labs Lab 03/13/16 1606 03/13/16 1751 03/13/16 2133 03/14/16 0627 03/14/16 1129  GLUCAP 221* 293* 206* 173* 214*    Time coordinating discharge:  Greater than 30 minutes  Signed:  Cori Justus, DO [facility] Pager: [phone] 03/14/2016, 1:41 PM

## 2016-03-14 NOTE — Progress Notes (Signed)
RT NOTE:  RT assisted patient with CPAP. RT will monitor.

## 2016-03-14 NOTE — H&P (Signed)
Physical Medicine and Rehabilitation Admission H&P    Chief Complaint  Patient presents with  . Weakness  : HPI: Danielle Vargas is a 65 y.o. right handed female with history of hypertension, obesity,Diabetes mellitus, hyperlipidemia and left rotator cuff repair. Per chart review patient lives alone and was independent prior to admission and active. One level apartment. She has a daughter in the area that works. Presented 03/10/2016 with left-sided weakness after a fall from bed. She denied any loss of consciousness MRI of the brain showed extensive areas of both focal and confluent restricted diffusion throughout the right MCA distribution. CTA suspect right M1-MCA long segment stenosis or occlusion. 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 Patient did not receive TPA. Echocardiogram completed with limited study. The left ventricle appeared normal in overall size and function.On the afternoon of 03/11/2016 with increased increased right-sided weakness and slurred speech. Follow-up CT of the head showed no evidence of new infarct . MRI showed mild extension of acute right MCA infarct as compared to 03/10/2016. Neurology services recommended to continue Plavix. TEE 03/12/2016 showing no cardiac source of embolism. LVH LAD dilation present as well as placement of loop recorder.  Neurology consulted currently on Plavix for CVA prophylaxis. Morganella UTI completing 7 day course of Vantin initiated 03/13/2016. Occupational therapy evaluation completed 03/11/2016 with recommendations of physical medicine rehabilitation consult. Patient was admitted for comprehensive rehabilitation program  ROS Constitutional: Negative for fever and chills.  Eyes: Negative for blurred vision and double vision.  Respiratory: Negative for cough and shortness of breath.  Cardiovascular: Negative for chest pain, palpitations and leg swelling.  Gastrointestinal: Positive for constipation.  Negative for nausea and vomiting.   GERD  Musculoskeletal: Positive for myalgias.  Skin: Negative for rash.  Neurological: Positive for speech change, focal weakness, weakness and headaches. Negative for sensory change, seizures and loss of consciousness.  Psychiatric/Behavioral: Positive for depression.  All other systems reviewed and are negative   Past Medical History  Diagnosis Date  . DM (diabetes mellitus) ([place])   . Obesity   . Dyslipidemia   . HTN (hypertension)   . Hypothyroidism   . Gastroparesis   . Migraine   . Osteoporosis   . Colon polyps 7/07    hyperplastic and adenomatous  . Nephrolithiasis   . Diverticulosis   . External hemorrhoid   . Fatty liver   . Asthma   . CAD (coronary artery disease)     a. 12/2008 Cath: mild irregs throughout, EF 75%.  . 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 Allergy   . Cataract     "just beginning"  . Myocardial infarction ([place])     FEb 2015  . Depression     pt unsure, psychologist said no  . Sleep apnea     wears CPAP  . Hyperlipidemia    Past Surgical History  Procedure Laterality Date  . Tubal ligation  '85  . Breast reduction surgery  '88  . Cervical disc surgery  '98    fusion  . Carpal tunnel release  2000    right wrist  . Rotator cuff repair  '09    left shoulder  . Ulnar nerve repair  '02, '08    left done, then right  . Cesarean section  '79  '84  . Left heart catheterization with coronary angiogram N/A 10/26/2013    Procedure: LEFT HEART CATHETERIZATION WITH CORONARY ANGIOGRAM;  Surgeon: Sinclair Grooms, MD;  Location: [facility]  CATH LAB;  Service: Cardiovascular;  Laterality: N/A;  . Colonoscopy    . Ep implantable device N/A 03/12/2016    Procedure: Loop Recorder Insertion;  Surgeon: Will Meredith Leeds, MD;  Location: [facility] CV LAB;  Service: Cardiovascular;  Laterality: N/A;  . Tee without cardioversion N/A 03/12/2016    Procedure: TRANSESOPHAGEAL ECHOCARDIOGRAM (TEE);  Surgeon: Sanda Klein,  MD;  Location: [facility] ENDOSCOPY;  Service: Cardiovascular;  Laterality: N/A;   Family History  Problem Relation Age of Onset  . COPD Father   . Stroke Father   . Emphysema Father   . Heart failure Brother   . Heart failure Mother   . Heart attack Mother     several  . Diabetes Mother   . Kidney disease Mother   . Hypertension Brother   . Colon cancer  45    mat. 1st cousin  . Other      celiac sprue, 1/2 brother  . Diabetes Brother     x 3  . Lung cancer Brother   . Cirrhosis Sister     liver transplant  . Liver disease Sister     transplant  . Heart attack Paternal Grandfather   . Heart attack Brother   . Esophageal cancer Neg Hx   . Stomach cancer Neg Hx   . Rectal cancer Neg Hx   . Colon cancer Paternal Aunt 80   Social History:  reports that she has never smoked. She has never used smokeless tobacco. She reports that she does not drink alcohol or use illicit drugs. Allergies:  Allergies  Allergen Reactions  . Invokana [Canagliflozin] Diarrhea  . Nsaids Other (See Comments)    GI problems/ stomach pain  . Penicillins Rash    Has patient had a PCN reaction causing immediate rash, facial/tongue/throat swelling, SOB or lightheadedness with hypotension: No (rash on legs after 3 days [place] -June 2016) Has patient had a PCN reaction causing severe rash involving mucus membranes or skin necrosis: No Has patient had a PCN reaction that required hospitalization No Has patient had a PCN reaction occurring within the last 10 years: Yes If all of the above answers are "NO", then may proceed with Cephalosporin use.   Medications Prior to Admission  Medication Sig Dispense Refill  . albuterol (PROVENTIL HFA;VENTOLIN HFA) 108 (90 BASE) MCG/ACT inhaler Inhale 2 puffs into the lungs every 6 (six) hours as needed for wheezing or shortness of breath.     Marland Kitchen amLODipine (NORVASC) 5 MG tablet Take 5 mg by mouth daily.    Marland Kitchen aspirin EC 81 MG tablet Take 81 mg by mouth daily.     .  budesonide-formoterol (SYMBICORT) 160-4.5 MCG/ACT inhaler Inhale 2 puffs into the lungs 2 (two) times daily as needed (shortness of breath).    . calcium citrate-vitamin D (CITRACAL+D) 315-200 MG-UNIT tablet Take 1 tablet by mouth daily.    . carvedilol (COREG) 12.5 MG tablet Take 1 tablet (12.5 mg total) by mouth 2 (two) times daily with a meal. 180 tablet 0  . Coenzyme Q10 (CO Q 10) 100 MG CAPS Take 100 mg by mouth 2 (two) times daily.     . CRESTOR 20 MG tablet TAKE 1 TABLET EVERY DAY (Patient taking differently: TAKE 1 TABLET EVERY DAY AT BEDTIME) 90 tablet 1  . fenofibrate 160 MG tablet Take 160 mg by mouth at bedtime.     . furosemide (LASIX) 20 MG tablet Take 1 tablet (20 mg total) by mouth every morning. (Patient taking  differently: Take 20 mg by mouth daily. ) 90 tablet 3  . gabapentin (NEURONTIN) 300 MG capsule Take 300 mg by mouth 3 (three) times daily.    Marland Kitchen HYDROcodone-acetaminophen (NORCO) 10-325 MG tablet Take 1 tablet by mouth 3 (three) times daily as needed (pain).   0  . insulin glargine (LANTUS) 100 UNIT/ML injection Inject into the skin 45 units in the morning and 45 units at bedtime. 90 mL 1  . insulin regular (HUMULIN R) 100 units/mL injection Inject 0.35-0.45 mLs (35-45 Units total) into the skin 3 (three) times daily before meals. (Patient taking differently: Inject 45-50 Units into the skin 3 (three) times daily before meals. ) 140 mL 1  . Insulin Syringe-Needle U-100 (INSULIN SYRINGE 1CC/31GX5/16") 31G X 5/16" 1 ML MISC Use to inject insulin 5 times daily. 500 each 3  . levothyroxine (SYNTHROID, LEVOTHROID) 75 MCG tablet Take 75 mcg by mouth daily.     Marland Kitchen losartan (COZAAR) 50 MG tablet Take 50 mg by mouth 2 (two) times daily.     . metFORMIN (GLUCOPHAGE) 1000 MG tablet Take 1,000 mg by mouth 2 (two) times daily with a meal.    . metoCLOPramide (REGLAN) 10 MG tablet Take 10 mg by mouth 4 (four) times daily.     . mometasone (NASONEX) 50 MCG/ACT nasal spray Place 2 sprays into  the nose daily. (Patient taking differently: Place 2 sprays into the nose daily as needed (allergies). ) 17 g 12  . montelukast (SINGULAIR) 10 MG tablet Take 10 mg by mouth at bedtime.    . Multiple Vitamin (MULTIVITAMIN WITH MINERALS) TABS tablet Take 1 tablet by mouth daily. One A Day 50+    . nitroGLYCERIN (NITROSTAT) 0.4 MG SL tablet Place 1 tablet (0.4 mg total) under the tongue every 5 (five) minutes as needed for chest pain. 25 tablet 3  . omeprazole (PRILOSEC) 20 MG capsule Take 20 mg by mouth 2 (two) times daily before a meal.    . pantoprazole (PROTONIX) 40 MG tablet Take 40 mg by mouth daily.    . polyethylene glycol (MIRALAX / GLYCOLAX) packet Take 17 g by mouth every other day.     Marland Kitchen tiZANidine (ZANAFLEX) 2 MG tablet Take 2 mg by mouth every 8 (eight) hours as needed for muscle spasms.     Marland Kitchen topiramate (TOPAMAX) 50 MG tablet Take 50 mg by mouth 2 (two) times daily.    . Vitamin D, Ergocalciferol, (DRISDOL) 50000 UNITS CAPS Take 50,000 Units by mouth every 7 (seven) days. Thursdays      Home: Home Living Family/patient expects to be discharged to:: Private residence Living Arrangements: Alone Type of Home: Apartment Home Access: Level entry Home Layout: One level Bathroom Shower/Tub: Multimedia programmer: Handicapped height [place]: None Additional Comments: able to drive   Functional History: Prior Function Level of Independence: Independent  Functional Status:  Mobility: Bed Mobility Overal bed mobility: Needs Assistance Bed Mobility: Rolling, Sidelying to Sit Rolling: Mod assist Sidelying to sit: Max assist, +2 for physical assistance General bed mobility comments: VCs for technique. Assist to bring LEs off EOB and to bring trunk from sidelying to sit. Transfers Overall transfer level: Needs assistance Equipment used: None Transfers: Sit to/from Stand, Stand Pivot Transfers Sit to Stand: Max assist, +2 physical assistance Stand pivot transfers:  Max assist, +2 physical assistance General transfer comment: L knee blocked during sit to stand and transfer. L knee locked into full extension in standing. Ambulation/Gait Ambulation/Gait assistance: Min assist  Ambulation Distance (Feet): 60 Feet (x1, 50 ft x1) Assistive device: Rolling walker (2 wheeled) Gait Pattern/deviations: Step-through pattern, Decreased stride length General Gait Details: Required max verbal cues to maintain L hand grip on walker. Required seated rest break of 3 min, +SOB.Marland Kitchen with onset of fatique, L LE instability. Gait velocity: decreased gait velocity Gait velocity interpretation: Below normal speed for age/gender    ADL: ADL Overall ADL's : Needs assistance/impaired Eating/Feeding: Set up, Sitting Eating/Feeding Details (indicate cue type and reason): daugther present and cutting up food for patient. Pt able to use R hand to self feed Grooming: Wash/dry hands, Set up, Sitting Grooming Details (indicate cue type and reason): incr effort for L Ue ot meet R Ue Toilet Transfer: Maximal assistance, +2 for physical assistance, Stand-pivot, BSC Toilet Transfer Details (indicate cue type and reason): Simulated by stand pivot transfer from EOB to chair. Toileting- Water quality scientist and Hygiene: Min guard, Sitting/lateral lean Functional mobility during ADLs: Maximal assistance, +2 for physical assistance (for stand pivot only) General ADL Comments: Pt with no active movement noted in L UE. Educated pt on attempting to move/use L UE functionally, proper positioning of LUE. Pt easily distracted and talking a lot, max verbal cues to focus on task.  Cognition: Cognition Overall Cognitive Status: Impaired/Different from baseline Orientation Level: Oriented X4 Cognition Arousal/Alertness: Awake/alert Behavior During Therapy: Impulsive (easily distracted) Overall Cognitive Status: Impaired/Different from baseline Area of Impairment: Memory, Safety/judgement Memory:  Decreased short-term memory Safety/Judgement: Decreased awareness of safety General Comments: Pt recalled name of PT from treatment two days prior.   Physical Exam: Blood pressure 151/81, pulse 90, temperature 98 F (36.7 C), temperature source Oral, resp. rate 18, height 5' 1"  (1.549 m), weight 96.9 kg (213 lb 10 oz), SpO2 97 %. Physical Exam Constitutional: She is oriented to person, place, and time. She appears well-developed and well-nourished.  HENT:  Head: Normocephalic and atraumatic.  Eyes: Right eye exhibits no discharge. Left eye exhibits no discharge.  Pupils round and reactive to light  Neck: Normal range of motion. Neck supple. No thyromegaly present.  Cardiovascular: Normal rate and regular rhythm.  Respiratory: Effort normal and breath sounds normal. No respiratory distress.  GI: Soft. Bowel sounds are normal. She exhibits no distension.  Musculoskeletal: She exhibits no edema or tenderness.  Neurological: She is alert and oriented to person, place, and time.  Follow simple commands.  Fair awareness of deficits Left facial weakness. Sensation intact to light touch DTRs symmetric Motor: RUE: 5/5 proximal to distal RLE: Hip flexion 4+/5, knee extension 4/5, ankle dorsi/plantar flexion 5/5 LUE: 0/5  LLE: able to wiggle toes, otherwise 0/5 Skin: Skin is warm and dry.  Psychiatric:  Mood, affect, behavior appear to be WNL.  Results for orders placed or performed during the hospital encounter of 03/10/16 (from the past 48 hour(s))  Glucose, capillary     Status: Abnormal   Collection Time: 03/12/16  4:40 PM  Result Value Ref Range   Glucose-Capillary 262 (H) 65 - 99 mg/dL   Comment 1 Notify RN    Comment 2 Document in Chart   Glucose, capillary     Status: Abnormal   Collection Time: 03/12/16  8:18 PM  Result Value Ref Range   Glucose-Capillary 219 (H) 65 - 99 mg/dL   Comment 1 Notify RN    Comment 2 Document in Chart   Glucose, capillary     Status: Abnormal     Collection Time: 03/12/16 10:37 PM  Result Value Ref Range  Glucose-Capillary 146 (H) 65 - 99 mg/dL  Basic metabolic panel     Status: Abnormal   Collection Time: 03/13/16  5:19 AM  Result Value Ref Range   Sodium 141 135 - 145 mmol/L   Potassium 3.7 3.5 - 5.1 mmol/L   Chloride 112 (H) 101 - 111 mmol/L   CO2 20 (L) 22 - 32 mmol/L   Glucose, Bld 155 (H) 65 - 99 mg/dL   BUN 14 6 - 20 mg/dL   Creatinine, Ser 0.85 0.44 - 1.00 mg/dL   Calcium 9.2 8.9 - 10.3 mg/dL   GFR calc non Af Amer >60 >60 mL/min   GFR calc Af Amer >60 >60 mL/min    Comment: (NOTE) The eGFR has been calculated using the CKD EPI equation. This calculation has not been validated in all clinical situations. eGFR's persistently <60 mL/min signify possible Chronic Kidney Disease.    Anion gap 9 5 - 15  Glucose, capillary     Status: Abnormal   Collection Time: 03/13/16  6:10 AM  Result Value Ref Range   Glucose-Capillary 159 (H) 65 - 99 mg/dL   Comment 1 Notify RN    Comment 2 Document in Chart   Glucose, capillary     Status: Abnormal   Collection Time: 03/13/16 11:33 AM  Result Value Ref Range   Glucose-Capillary 251 (H) 65 - 99 mg/dL   Comment 1 Notify RN    Comment 2 Document in Chart   Glucose, capillary     Status: Abnormal   Collection Time: 03/13/16  4:06 PM  Result Value Ref Range   Glucose-Capillary 221 (H) 65 - 99 mg/dL   Comment 1 Notify RN    Comment 2 Document in Chart   Glucose, capillary     Status: Abnormal   Collection Time: 03/13/16  5:51 PM  Result Value Ref Range   Glucose-Capillary 293 (H) 65 - 99 mg/dL   Comment 1 Notify RN    Comment 2 Document in Chart   Glucose, capillary     Status: Abnormal   Collection Time: 03/13/16  9:33 PM  Result Value Ref Range   Glucose-Capillary 206 (H) 65 - 99 mg/dL   Comment 1 Notify RN    Comment 2 Document in Chart   Glucose, capillary     Status: Abnormal   Collection Time: 03/14/16  6:27 AM  Result Value Ref Range   Glucose-Capillary 173  (H) 65 - 99 mg/dL   Comment 1 Notify RN    Comment 2 Document in Chart   Glucose, capillary     Status: Abnormal   Collection Time: 03/14/16 11:29 AM  Result Value Ref Range   Glucose-Capillary 214 (H) 65 - 99 mg/dL   Comment 1 Notify RN    Comment 2 Document in Chart    Mr Brain Ltd W/o Cm  03/12/2016  CLINICAL DATA:  65 year old female with abrupt onset left side weakness beginning overnight July 1st-2nd, slid out of bed and was found down by her daughter 6 hours later. Right MCA infarct. Initial encounter. EXAM: MRI HEAD WITHOUT CONTRAST TECHNIQUE: Multiplanar, multiecho pulse sequences of the brain and surrounding structures were obtained without intravenous contrast. COMPARISON:  Brain MRI 03/10/2016 and other recent studies. FINDINGS: The examination had to be discontinued prior to completion due to patient condition ; refusal to complete. Today images are also mildly degraded by motion despite repeated imaging attempts. Major intracranial vascular flow voids are stable. Increased volume of restricted diffusion in the  right MCA territory, centered on the insula and operculum as before. The right basal ganglia remain spared. More of the posterior right temporal lobe and lateral right peri-Rolandic cortex is affected now. Associated increased T2 and FLAIR signal in a cytotoxic edema pattern. Mildly increased mass effect on the right lateral ventricle, but no evidence of associated hemorrhage. No midline shift. No ventriculomegaly. No contralateral or posterior fossa restricted diffusion. Stable gray and white matter signal outside of the acutely affected parenchyma. Small chronic left cerebellar infarcts again noted. Small chronic anterior left insula infarct again noted. Occasional other small chronic cortically based infarcts in both hemispheres were better demonstrated on the prior MRI due to motion today. Visualized paranasal sinuses and mastoids are stable and well pneumatized. Stable orbit and  scalp soft tissues. IMPRESSION: 1. Mild extension of the acute right MCA infarct since 03/10/2016. Increased cytotoxic edema, but no associated hemorrhage or significant intracranial mass effect at this time. 2. No new intracranial abnormality. Electronically Signed   By: Genevie Ann M.D.   On: 03/12/2016 16:29    Medical Problem List and Plan: 1.  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 Status post loop recorder 2.  DVT Prophylaxis/Anticoagulation: SCDs. Monitor for any signs of DVT. Venous Doppler study negative 3. Pain Management: Neurontin 300 mg 3 times a day, Topamax 50 mg twice a day, hydrocodone as needed 4. Diabetes mellitus of peripheral neuropathy. Hemoglobin A1c 7.3. Lantus insulin 45 units twice a day, NovoLog 15 units 3 times a day. Check blood sugars before meals and at bedtime. Diabetic teaching 5. Neuropsych: This patient is capable of making decisions on her own behalf. 6. Skin/Wound Care: Routine skin checks 7. Fluids/Electrolytes/Nutrition: Routine I&O with follow-up chemistries 8. Hypertension. Coreg 6.25 mg twice a day 9. Hypothyroidism. Synthroid 10. Hyperlipidemia. Crestor, fenofibrate 11. Asthma. Singulair 10 mg daily, Dulera 2 puffs twice daily, albuterol nebulizer as needed 12. Morbid Obesity. Body mass index is 40.39 kg/(m^2)., Diet and exercise education, cont to encourage weight loss to increase endurance and promote overall health. Dietary follow-up  13. Morganella UTI. 7 day course of Vantin 200 mg every 12 hours times initiated 03/13/2016 14. Obesity hypoventilation syndrome: CPAP   Post Admission Physician Evaluation: 1. Functional deficits secondary  to right MCA territory embolic infarct with extension due to hypotension. 2. Patient is admitted to receive collaborative, interdisciplinary care between the physiatrist, rehab nursing staff, and therapy team. 3. Patient's level of medical complexity and  substantial therapy needs in context of that medical necessity cannot be provided at a lesser intensity of care such as a SNF. 4. Patient has experienced substantial functional loss from his/her baseline which was documented above under the "Functional History" and "Functional Status" headings.  Judging by the patient's diagnosis, physical exam, and functional history, the patient has potential for functional progress which will result in measurable gains while on inpatient rehab.  These gains will be of substantial and practical use upon discharge  in facilitating mobility and self-care at the household level. 5. Physiatrist will provide 24 hour management of medical needs as well as oversight of the therapy plan/treatment and provide guidance as appropriate regarding the interaction of the two. 6. 24 hour rehab nursing will assist with safety, skin/wound care, disease management, medication administration and patient education and help integrate therapy concepts, techniques,education, etc. 7. PT will assess and treat for/with: Lower extremity strength, range of motion, stamina, balance, functional mobility, safety, adaptive techniques and equipment, coping  skills, pain control, stroke education.   Goals are: Mod/Max A. 8. OT will assess and treat for/with: ADL's, functional mobility, safety, upper extremity strength, adaptive techniques and equipment,  ego support, and community reintegration.   Goals are: Mod/Min A. Therapy may proceed with showering this patient. 9. SLP will assess and treat for/with: speech.  Goals are: Mod I/Ind. 10. Case Management and Social Worker will assess and treat for psychological issues and discharge planning. 11. Team conference will be held weekly to assess progress toward goals and to determine barriers to discharge. 12. Patient will receive at least 3 hours of therapy per day at least 5 days per week. 13. ELOS: 18-22 days.       14. Prognosis:  good and fair  Delice Lesch, MD 03/14/2016

## 2016-03-14 NOTE — Progress Notes (Signed)
Ankit Lorie Phenix, MD Physician Signed Physical Medicine and Rehabilitation Consult Note 03/11/2016 11:12 AM  Related encounter: ED to Hosp-Admission (Current) from 03/10/2016 in [facility] Collapse All        Physical Medicine and Rehabilitation Consult Reason for Consult: Right MCA territory infarct Referring Physician: [facility]   HPI: Danielle Vargas is a 65 y.o. right handed female with history of hypertension, obesity, hyperlipidemia and left rotator cuff repair. Per chart review patient lives alone and was independent prior to admission and active. One level apartment. She has a daughter in the area that works. Presented 03/10/2016 with left-sided weakness after a fall from bed. She denied any loss of consciousness MRI of the brain showed extensive areas of both focal and confluent restricted diffusion throughout the right MCA distribution. CTA suspect right M1-MCA long segment stenosis or occlusion. 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 Patient did not receive TPA. Echocardiogram completed with limited study. The left ventricle appeared normal in overall size and function. TEE relatively unremarkable. Neurology consulted currently on Plavix for CVA prophylaxis. Occupational therapy evaluation completed 03/11/2016 with recommendations of physical medicine rehabilitation consult.   Review of Systems  Constitutional: Negative for fever and chills.  Eyes: Negative for blurred vision and double vision.  Respiratory: Negative for cough and shortness of breath.  Cardiovascular: Negative for chest pain, palpitations and leg swelling.  Gastrointestinal: Positive for constipation. Negative for nausea and vomiting.   GERD  Musculoskeletal: Positive for myalgias.  Skin: Negative for rash.  Neurological: Positive for speech change, focal weakness, weakness and headaches. Negative for sensory change, seizures and loss  of consciousness.  Psychiatric/Behavioral: Positive for depression.  All other systems reviewed and are negative.  Past Medical History  Diagnosis Date  . DM (diabetes mellitus) ([place])   . Obesity   . Dyslipidemia   . HTN (hypertension)   . Hypothyroidism   . Gastroparesis   . Migraine   . Osteoporosis   . Colon polyps 7/07    hyperplastic and adenomatous  . Nephrolithiasis   . Diverticulosis   . External hemorrhoid   . Fatty liver   . Asthma   . CAD (coronary artery disease)     a. 12/2008 Cath: mild irregs throughout, EF 75%.  . 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 Allergy   . Cataract     "just beginning"  . Myocardial infarction ([place])     FEb 2015  . Depression     pt unsure, psychologist said no  . Sleep apnea     wears CPAP  . Hyperlipidemia    Past Surgical History  Procedure Laterality Date  . Tubal ligation  '85  . Breast reduction surgery  '88  . Cervical disc surgery  '98    fusion  . Carpal tunnel release  2000    right wrist  . Rotator cuff repair  '09    left shoulder  . Ulnar nerve repair  '02, '08    left done, then right  . Cesarean section  '79 '84  . Left heart catheterization with coronary angiogram N/A 10/26/2013    Procedure: LEFT HEART CATHETERIZATION WITH CORONARY ANGIOGRAM; Surgeon: Sinclair Grooms, MD; Location: [facility] CATH LAB; Service: Cardiovascular; Laterality: N/A;  . Colonoscopy     Family History  Problem Relation Age of Onset  . COPD Father   . Stroke Father   . Emphysema Father   .  Heart failure Brother   . Heart failure Mother   . Heart attack Mother     several  . Diabetes Mother   . Kidney disease Mother   . Hypertension Brother   . Colon cancer  45    mat. 1st cousin  . Other      celiac sprue, 1/2 brother  .  Diabetes Brother     x 3  . Lung cancer Brother   . Cirrhosis Sister     liver transplant  . Liver disease Sister     transplant  . Heart attack Paternal Grandfather   . Heart attack Brother   . Esophageal cancer Neg Hx   . Stomach cancer Neg Hx   . Rectal cancer Neg Hx   . Colon cancer Paternal Aunt 80   Social History:  reports that she has never smoked. She has never used smokeless tobacco. She reports that she does not drink alcohol or use illicit drugs. Allergies:  Allergies  Allergen Reactions  . Invokana [Canagliflozin] Diarrhea  . Nsaids Other (See Comments)    GI problems/ stomach pain  . Penicillins Rash    Has patient had a PCN reaction causing immediate rash, facial/tongue/throat swelling, SOB or lightheadedness with hypotension: No (rash on legs after 3 days [place] -June 2016) Has patient had a PCN reaction causing severe rash involving mucus membranes or skin necrosis: No Has patient had a PCN reaction that required hospitalization No Has patient had a PCN reaction occurring within the last 10 years: Yes If all of the above answers are "NO", then may proceed with Cephalosporin use.   Medications Prior to Admission  Medication Sig Dispense Refill  . albuterol (PROVENTIL HFA;VENTOLIN HFA) 108 (90 BASE) MCG/ACT inhaler Inhale 2 puffs into the lungs every 6 (six) hours as needed for wheezing or shortness of breath.     Marland Kitchen amLODipine (NORVASC) 5 MG tablet Take 5 mg by mouth daily.    Marland Kitchen aspirin EC 81 MG tablet Take 81 mg by mouth daily.     . budesonide-formoterol (SYMBICORT) 160-4.5 MCG/ACT inhaler Inhale 2 puffs into the lungs 2 (two) times daily as needed (shortness of breath).    . calcium citrate-vitamin D (CITRACAL+D) 315-200 MG-UNIT tablet Take 1 tablet by mouth daily.    . carvedilol (COREG) 12.5 MG tablet Take 1 tablet (12.5 mg total) by mouth 2 (two) times daily with a  meal. 180 tablet 0  . Coenzyme Q10 (CO Q 10) 100 MG CAPS Take 100 mg by mouth 2 (two) times daily.     . CRESTOR 20 MG tablet TAKE 1 TABLET EVERY DAY (Patient taking differently: TAKE 1 TABLET EVERY DAY AT BEDTIME) 90 tablet 1  . fenofibrate 160 MG tablet Take 160 mg by mouth at bedtime.     . furosemide (LASIX) 20 MG tablet Take 1 tablet (20 mg total) by mouth every morning. (Patient taking differently: Take 20 mg by mouth daily. ) 90 tablet 3  . gabapentin (NEURONTIN) 300 MG capsule Take 300 mg by mouth 3 (three) times daily.    Marland Kitchen HYDROcodone-acetaminophen (NORCO) 10-325 MG tablet Take 1 tablet by mouth 3 (three) times daily as needed (pain).   0  . insulin glargine (LANTUS) 100 UNIT/ML injection Inject into the skin 45 units in the morning and 45 units at bedtime. 90 mL 1  . insulin regular (HUMULIN R) 100 units/mL injection Inject 0.35-0.45 mLs (35-45 Units total) into the skin 3 (three) times daily before meals. (Patient  taking differently: Inject 45-50 Units into the skin 3 (three) times daily before meals. ) 140 mL 1  . Insulin Syringe-Needle U-100 (INSULIN SYRINGE 1CC/31GX5/16") 31G X 5/16" 1 ML MISC Use to inject insulin 5 times daily. 500 each 3  . levothyroxine (SYNTHROID, LEVOTHROID) 75 MCG tablet Take 75 mcg by mouth daily.     Marland Kitchen losartan (COZAAR) 50 MG tablet Take 50 mg by mouth 2 (two) times daily.     . metFORMIN (GLUCOPHAGE) 1000 MG tablet Take 1,000 mg by mouth 2 (two) times daily with a meal.    . metoCLOPramide (REGLAN) 10 MG tablet Take 10 mg by mouth 4 (four) times daily.     . mometasone (NASONEX) 50 MCG/ACT nasal spray Place 2 sprays into the nose daily. (Patient taking differently: Place 2 sprays into the nose daily as needed (allergies). ) 17 g 12  . montelukast (SINGULAIR) 10 MG tablet Take 10 mg by mouth at bedtime.    . Multiple Vitamin (MULTIVITAMIN WITH MINERALS) TABS tablet Take 1 tablet by  mouth daily. One A Day 50+    . nitroGLYCERIN (NITROSTAT) 0.4 MG SL tablet Place 1 tablet (0.4 mg total) under the tongue every 5 (five) minutes as needed for chest pain. 25 tablet 3  . omeprazole (PRILOSEC) 20 MG capsule Take 20 mg by mouth 2 (two) times daily before a meal.    . pantoprazole (PROTONIX) 40 MG tablet Take 40 mg by mouth daily.    . polyethylene glycol (MIRALAX / GLYCOLAX) packet Take 17 g by mouth every other day.     Marland Kitchen tiZANidine (ZANAFLEX) 2 MG tablet Take 2 mg by mouth every 8 (eight) hours as needed for muscle spasms.     Marland Kitchen topiramate (TOPAMAX) 50 MG tablet Take 50 mg by mouth 2 (two) times daily.    . Vitamin D, Ergocalciferol, (DRISDOL) 50000 UNITS CAPS Take 50,000 Units by mouth every 7 (seven) days. Thursdays      Home: Home Living Family/patient expects to be discharged to:: Private residence Living Arrangements: Alone Type of Home: Apartment Home Access: Level entry Home Layout: One level Bathroom Shower/Tub: Multimedia programmer: Handicapped height Home Equipment: None Additional Comments: drives , play a game called "hay day"   Functional History: Prior Function Level of Independence: Independent Functional Status:  Mobility: Bed Mobility General bed mobility comments: in chair on arrival Transfers Overall transfer level: Needs assistance Equipment used: 1 person hand held assist Transfers: Sit to/from Stand Sit to Stand: Min assist General transfer comment: requires use of bil Ue to push up into standing      ADL: ADL Overall ADL's : Needs assistance/impaired Eating/Feeding: Set up, Sitting Eating/Feeding Details (indicate cue type and reason): daugther present and cutting up food for patient. Pt able to use R hand to self feed Grooming: Wash/dry hands, Set up, Sitting Grooming Details (indicate cue type and reason): incr effort for L Ue ot meet R Ue Toilet Transfer: Minimal assistance,  Ambulation, BSC Toilet Transfer Details (indicate cue type and reason): Pt reaching for environmental supports. pt could benefit from trial of RW Toileting- Water quality scientist and Hygiene: Min guard, Sitting/lateral lean Functional mobility during ADLs: Minimal assistance General ADL Comments: Pt provided hand held (A) to ambulate to bathroom. pt with more of a noticeable limp to gait exiting bathroom and pt reports fatigue. Pt could benefit from trial of RW. Pt will have children (A) upon d/c home  Cognition: Cognition Overall Cognitive Status: Within Functional Limits for  tasks assessed Orientation Level: Oriented X4 Cognition Arousal/Alertness: Awake/alert Behavior During Therapy: WFL for tasks assessed/performed Overall Cognitive Status: Within Functional Limits for tasks assessed  Blood pressure 161/63, pulse 88, temperature 99 F (37.2 C), temperature source Oral, resp. rate 18, SpO2 100 %. Physical Exam  Vitals reviewed. Constitutional: She is oriented to person, place, and time. She appears well-developed and well-nourished.  HENT:  Head: Normocephalic and atraumatic.  Eyes: Right eye exhibits no discharge. Left eye exhibits no discharge.  Pupils round and reactive to light  Neck: Normal range of motion. Neck supple. No thyromegaly present.  Cardiovascular: Normal rate and regular rhythm.  Respiratory: Effort normal and breath sounds normal. No respiratory distress.  GI: Soft. Bowel sounds are normal. She exhibits no distension.  Musculoskeletal: She exhibits no edema or tenderness.  Neurological: She is alert and oriented to person, place, and time.  Follow simple commands.  Fair awareness of deficits Lethargy from recent sedation. Left facial weakness. Sensation intact to light touch DTRs symmetric Motor: RUE: 5/5 proximal to distal RLE: Hip flexion 3+/5, knee extension 4/5, ankle dorsi/plantar flexion 5/5 LUE/LLE: 0/5  Skin: Skin is warm and dry.  Psychiatric:   Unable to assess due to lethargy from recent sedation     Lab Results Last 24 Hours    Results for orders placed or performed during the hospital encounter of 03/10/16 (from the past 24 hour(s))  Comprehensive metabolic panel Status: Abnormal   Collection Time: 03/10/16 1:56 PM  Result Value Ref Range   Sodium 137 135 - 145 mmol/L   Potassium 3.8 3.5 - 5.1 mmol/L   Chloride 109 101 - 111 mmol/L   CO2 20 (L) 22 - 32 mmol/L   Glucose, Bld 266 (H) 65 - 99 mg/dL   BUN 24 (H) 6 - 20 mg/dL   Creatinine, Ser 1.14 (H) 0.44 - 1.00 mg/dL   Calcium 9.5 8.9 - 10.3 mg/dL   Total Protein 6.6 6.5 - 8.1 g/dL   Albumin 3.6 3.5 - 5.0 g/dL   AST 38 15 - 41 U/L   ALT 30 14 - 54 U/L   Alkaline Phosphatase 40 38 - 126 U/L   Total Bilirubin 0.8 0.3 - 1.2 mg/dL   GFR calc non Af Amer 50 (L) >60 mL/min   GFR calc Af Amer 58 (L) >60 mL/min   Anion gap 8 5 - 15  CBC with Differential Status: Abnormal   Collection Time: 03/10/16 1:56 PM  Result Value Ref Range   WBC 9.7 4.0 - 10.5 K/uL   RBC 4.39 3.87 - 5.11 MIL/uL   Hemoglobin 12.1 12.0 - 15.0 g/dL   HCT 38.5 36.0 - 46.0 %   MCV 87.7 78.0 - 100.0 fL   MCH 27.6 26.0 - 34.0 pg   MCHC 31.4 30.0 - 36.0 g/dL   RDW 16.5 (H) 11.5 - 15.5 %   Platelets 293 150 - 400 K/uL   Neutrophils Relative % 68 %   Neutro Abs 6.7 1.7 - 7.7 K/uL   Lymphocytes Relative 24 %   Lymphs Abs 2.3 0.7 - 4.0 K/uL   Monocytes Relative 5 %   Monocytes Absolute 0.5 0.1 - 1.0 K/uL   Eosinophils Relative 2 %   Eosinophils Absolute 0.2 0.0 - 0.7 K/uL   Basophils Relative 1 %   Basophils Absolute 0.1 0.0 - 0.1 K/uL  CK Status: Abnormal   Collection Time: 03/10/16 1:56 PM  Result Value Ref Range   Total CK 357 (H) 38 -  234 U/L  Urinalysis, Routine w reflex microscopic Status: Abnormal    Collection Time: 03/10/16 3:00 PM  Result Value Ref Range   Color, Urine YELLOW YELLOW   APPearance CLOUDY (A) CLEAR   Specific Gravity, Urine 1.025 1.005 - 1.030   pH 5.5 5.0 - 8.0   Glucose, UA 500 (A) NEGATIVE mg/dL   Hgb urine dipstick MODERATE (A) NEGATIVE   Bilirubin Urine NEGATIVE NEGATIVE   Ketones, ur 15 (A) NEGATIVE mg/dL   Protein, ur 30 (A) NEGATIVE mg/dL   Nitrite POSITIVE (A) NEGATIVE   Leukocytes, UA MODERATE (A) NEGATIVE  Urine microscopic-add on Status: Abnormal   Collection Time: 03/10/16 3:00 PM  Result Value Ref Range   Squamous Epithelial / LPF 0-5 (A) NONE SEEN   WBC, UA TOO NUMEROUS TO COUNT 0 - 5 WBC/hpf   RBC / HPF 0-5 0 - 5 RBC/hpf   Bacteria, UA MANY (A) NONE SEEN   Casts HYALINE CASTS (A) NEGATIVE  Urine rapid drug screen (hosp performed)not at [facility] Status: Abnormal   Collection Time: 03/10/16 3:00 PM  Result Value Ref Range   Opiates POSITIVE (A) NONE DETECTED   Cocaine NONE DETECTED NONE DETECTED   Benzodiazepines NONE DETECTED NONE DETECTED   Amphetamines NONE DETECTED NONE DETECTED   Tetrahydrocannabinol NONE DETECTED NONE DETECTED   Barbiturates NONE DETECTED NONE DETECTED  Protime-INR Status: Abnormal   Collection Time: 03/10/16 4:48 PM  Result Value Ref Range   Prothrombin Time 15.5 (H) 11.6 - 15.2 seconds   INR 1.21 0.00 - 1.49  APTT Status: None   Collection Time: 03/10/16 4:48 PM  Result Value Ref Range   aPTT 25 24 - 37 seconds  Troponin I Status: Abnormal   Collection Time: 03/10/16 4:48 PM  Result Value Ref Range   Troponin I 0.10 (HH) <0.03 ng/mL  Glucose, capillary Status: Abnormal   Collection Time: 03/10/16 5:49 PM  Result Value Ref Range   Glucose-Capillary 175 (H) 65 - 99 mg/dL   Comment 1 Notify RN    Comment 2 Document in Chart     Glucose, capillary Status: Abnormal   Collection Time: 03/10/16 10:24 PM  Result Value Ref Range   Glucose-Capillary 313 (H) 65 - 99 mg/dL   Comment 1 Notify RN    Comment 2 Document in Chart   Troponin I (q 6hr x 3) Status: Abnormal   Collection Time: 03/10/16 11:10 PM  Result Value Ref Range   Troponin I 0.09 (HH) <0.03 ng/mL  Basic metabolic panel Status: Abnormal   Collection Time: 03/11/16 5:07 AM  Result Value Ref Range   Sodium 139 135 - 145 mmol/L   Potassium 3.8 3.5 - 5.1 mmol/L   Chloride 110 101 - 111 mmol/L   CO2 21 (L) 22 - 32 mmol/L   Glucose, Bld 203 (H) 65 - 99 mg/dL   BUN 20 6 - 20 mg/dL   Creatinine, Ser 1.07 (H) 0.44 - 1.00 mg/dL   Calcium 9.2 8.9 - 10.3 mg/dL   GFR calc non Af Amer 54 (L) >60 mL/min   GFR calc Af Amer >60 >60 mL/min   Anion gap 8 5 - 15  CBC Status: Abnormal   Collection Time: 03/11/16 5:07 AM  Result Value Ref Range   WBC 7.2 4.0 - 10.5 K/uL   RBC 4.38 3.87 - 5.11 MIL/uL   Hemoglobin 12.2 12.0 - 15.0 g/dL   HCT 39.0 36.0 - 46.0 %   MCV 89.0 78.0 - 100.0 fL  MCH 27.9 26.0 - 34.0 pg   MCHC 31.3 30.0 - 36.0 g/dL   RDW 16.3 (H) 11.5 - 15.5 %   Platelets 272 150 - 400 K/uL  Troponin I (q 6hr x 3) Status: Abnormal   Collection Time: 03/11/16 5:07 AM  Result Value Ref Range   Troponin I 0.08 (HH) <0.03 ng/mL  Lipid panel Status: Abnormal   Collection Time: 03/11/16 5:07 AM  Result Value Ref Range   Cholesterol 138 0 - 200 mg/dL   Triglycerides 232 (H) <150 mg/dL   HDL 29 (L) >40 mg/dL   Total CHOL/HDL Ratio 4.8 RATIO   VLDL 46 (H) 0 - 40 mg/dL   LDL Cholesterol 63 0 - 99 mg/dL  Glucose, capillary Status: Abnormal   Collection Time: 03/11/16 6:36 AM  Result Value Ref Range   Glucose-Capillary 194 (H) 65 - 99 mg/dL   Comment 1  Notify RN    Comment 2 Document in Chart       Imaging Results (Last 48 hours)    Ct Angio Head W Or Wo Contrast  03/11/2016 CLINICAL DATA: Continued surveillance RIGHT hemisphere infarction. LEFT hemiparesis. EXAM: CT ANGIOGRAPHY HEAD AND NECK TECHNIQUE: Multidetector CT imaging of the head and neck was performed using the standard protocol during bolus administration of intravenous contrast. Multiplanar CT image reconstructions and MIPs were obtained to evaluate the vascular anatomy. Carotid stenosis measurements (when applicable) are obtained utilizing NASCET criteria, using the distal internal carotid diameter as the denominator. CONTRAST: Isovue 370, 50 mL. COMPARISON: MRI brain 03/10/2016. Noncontrast CT head 03/10/2016. FINDINGS: CT HEAD Calvarium and skull base: No fracture or destructive lesion. Mastoids and middle ears are grossly clear. Paranasal sinuses: Imaged portions are clear. Orbits: Negative. Brain: No hemorrhage is seen. Cytotoxic edema is now developing in the areas of restricted diffusion noted on MR. No hemorrhagic transformation. No midline shift. Chronic atrophic change, stable. White matter hypoattenuation favored to represent small vessel disease. CTA NECK Aortic arch: Standard branching. Imaged portion shows no evidence of aneurysm or dissection. No significant stenosis of the major arch vessel origins. Right carotid system: Calcific plaque at the bifurcation extending into the RIGHT ICA. No evidence of dissection, stenosis (50% or greater) or occlusion. Left carotid system: Calcific plaque at the bifurcation, extending into the LEFT ICA No evidence of dissection, stenosis (50% or greater) or occlusion. Vertebral arteries: LEFT vertebral dominant. RIGHT vertebral diminutive no stenosis in the neck. No evidence of dissection, stenosis (50% or greater) or occlusion. Nonvascular soft tissues: No masses. Spondylosis. No lung apex lesion. Mediastinal lipomatosis reflecting  increased body habitus. Previous cervical fusion [id number] appears uncomplicated. CTA HEAD Anterior circulation: No significant cavernous ICA disease. LEFT anterior circulation normal. On the RIGHT, there is an early bifurcation of the M1 MCA. There is severe stenosis of the superior M2 segment corresponding to the areas of acute infarction. See image 228 series 402. The inferior M2 segment demonstrates mild stenosis at its origin, probably not flow reducing. No aneurysm, or vascular malformation. Posterior circulation: LEFT vertebral dominant. RIGHT vertebral has a diminutive connection to the basilar, but primarily supplies the RIGHT PICA. No significant stenosis, proximal occlusion, aneurysm, or vascular malformation. Venous sinuses: As permitted by contrast timing, patent. Anatomic variants: None of significance. Delayed phase: No abnormal intracranial enhancement. IMPRESSION: Non stenotic extracranial carotid bifurcation disease, consisting of calcific plaque. Severe stenosis of the RIGHT MCA M2 superior division vessel, corresponding with the observed areas of infarction. Developing nonhemorrhagic cytotoxic edema, RIGHT hemisphere, without significant mass  effect. Electronically Signed By: Staci Righter M.D. On: 03/11/2016 09:29   Dg Chest 2 View  03/10/2016 CLINICAL DATA: Left lower chest pain beginning after landing on left side for 6 hours. Weakness. EXAM: CHEST 2 VIEW COMPARISON: 11/10/2015 FINDINGS: Lungs are adequately inflated without focal consolidation or effusion. Stable cardiomegaly. There are degenerative changes of the spine. Partially visualized fusion hardware over the cervical spine intact. Surgical change of the left AC joint. Prominent overlying soft tissues. IMPRESSION: No active cardiopulmonary disease. Stable cardiomegaly. Electronically Signed By: Marin Olp M.D. On: 03/10/2016 17:25   Ct Head Wo Contrast  03/10/2016 CLINICAL DATA: Fall, left-sided weakness. EXAM: CT  HEAD WITHOUT CONTRAST CT CERVICAL SPINE WITHOUT CONTRAST TECHNIQUE: Multidetector CT imaging of the head and cervical spine was performed following the standard protocol without intravenous contrast. Multiplanar CT image reconstructions of the cervical spine were also generated. COMPARISON: MRI brain 06/10/2006 FINDINGS: CT HEAD FINDINGS There is low-density throughout the insular cortex and adjacent temporal lobe and frontal lobe on the right compatible with acute infarct. No hemorrhage. No hydrocephalus or midline shift. No acute calvarial abnormality. CT CERVICAL SPINE FINDINGS Prior anterior fusion from C5-C7. Degenerative changes at see C4-5 and C7-T1. No fracture. No malalignment. Prevertebral soft tissues are normal. No epidural or paraspinal hematoma. IMPRESSION: Acute right MCA infarct involving the insular cortex and adjacent right frontal and temporal lobes. No hemorrhage. No acute bony abnormality in the cervical spine. Critical Value/emergent results were called by telephone at the time of interpretation on 03/10/2016 at 2:04 pm to Dr. Theotis Burrow , who verbally acknowledged these results. Electronically Signed By: Rolm Baptise M.D. On: 03/10/2016 14:04   Ct Angio Neck W Or Wo Contrast  03/11/2016 CLINICAL DATA: Continued surveillance RIGHT hemisphere infarction. LEFT hemiparesis. EXAM: CT ANGIOGRAPHY HEAD AND NECK TECHNIQUE: Multidetector CT imaging of the head and neck was performed using the standard protocol during bolus administration of intravenous contrast. Multiplanar CT image reconstructions and MIPs were obtained to evaluate the vascular anatomy. Carotid stenosis measurements (when applicable) are obtained utilizing NASCET criteria, using the distal internal carotid diameter as the denominator. CONTRAST: Isovue 370, 50 mL. COMPARISON: MRI brain 03/10/2016. Noncontrast CT head 03/10/2016. FINDINGS: CT HEAD Calvarium and skull base: No fracture or destructive lesion. Mastoids and  middle ears are grossly clear. Paranasal sinuses: Imaged portions are clear. Orbits: Negative. Brain: No hemorrhage is seen. Cytotoxic edema is now developing in the areas of restricted diffusion noted on MR. No hemorrhagic transformation. No midline shift. Chronic atrophic change, stable. White matter hypoattenuation favored to represent small vessel disease. CTA NECK Aortic arch: Standard branching. Imaged portion shows no evidence of aneurysm or dissection. No significant stenosis of the major arch vessel origins. Right carotid system: Calcific plaque at the bifurcation extending into the RIGHT ICA. No evidence of dissection, stenosis (50% or greater) or occlusion. Left carotid system: Calcific plaque at the bifurcation, extending into the LEFT ICA No evidence of dissection, stenosis (50% or greater) or occlusion. Vertebral arteries: LEFT vertebral dominant. RIGHT vertebral diminutive no stenosis in the neck. No evidence of dissection, stenosis (50% or greater) or occlusion. Nonvascular soft tissues: No masses. Spondylosis. No lung apex lesion. Mediastinal lipomatosis reflecting increased body habitus. Previous cervical fusion [id number] appears uncomplicated. CTA HEAD Anterior circulation: No significant cavernous ICA disease. LEFT anterior circulation normal. On the RIGHT, there is an early bifurcation of the M1 MCA. There is severe stenosis of the superior M2 segment corresponding to the areas of acute infarction. See image 228 series  402. The inferior M2 segment demonstrates mild stenosis at its origin, probably not flow reducing. No aneurysm, or vascular malformation. Posterior circulation: LEFT vertebral dominant. RIGHT vertebral has a diminutive connection to the basilar, but primarily supplies the RIGHT PICA. No significant stenosis, proximal occlusion, aneurysm, or vascular malformation. Venous sinuses: As permitted by contrast timing, patent. Anatomic variants: None of significance. Delayed phase: No  abnormal intracranial enhancement. IMPRESSION: Non stenotic extracranial carotid bifurcation disease, consisting of calcific plaque. Severe stenosis of the RIGHT MCA M2 superior division vessel, corresponding with the observed areas of infarction. Developing nonhemorrhagic cytotoxic edema, RIGHT hemisphere, without significant mass effect. Electronically Signed By: Staci Righter M.D. On: 03/11/2016 09:29   Ct Cervical Spine Wo Contrast  03/10/2016 CLINICAL DATA: Fall, left-sided weakness. EXAM: CT HEAD WITHOUT CONTRAST CT CERVICAL SPINE WITHOUT CONTRAST TECHNIQUE: Multidetector CT imaging of the head and cervical spine was performed following the standard protocol without intravenous contrast. Multiplanar CT image reconstructions of the cervical spine were also generated. COMPARISON: MRI brain 06/10/2006 FINDINGS: CT HEAD FINDINGS There is low-density throughout the insular cortex and adjacent temporal lobe and frontal lobe on the right compatible with acute infarct. No hemorrhage. No hydrocephalus or midline shift. No acute calvarial abnormality. CT CERVICAL SPINE FINDINGS Prior anterior fusion from C5-C7. Degenerative changes at see C4-5 and C7-T1. No fracture. No malalignment. Prevertebral soft tissues are normal. No epidural or paraspinal hematoma. IMPRESSION: Acute right MCA infarct involving the insular cortex and adjacent right frontal and temporal lobes. No hemorrhage. No acute bony abnormality in the cervical spine. Critical Value/emergent results were called by telephone at the time of interpretation on 03/10/2016 at 2:04 pm to Dr. Theotis Burrow , who verbally acknowledged these results. Electronically Signed By: Rolm Baptise M.D. On: 03/10/2016 14:04   Mr Brain Wo Contrast  03/10/2016 CLINICAL DATA: Left-sided weakness for at least 2 days. Stroke risk factors include diabetes, hypertension, dyslipidemia, and previous stroke. EXAM: MRI HEAD WITHOUT CONTRAST TECHNIQUE: Multiplanar, multiecho  pulse sequences of the brain and surrounding structures were obtained without intravenous contrast. COMPARISON: CT head performed earlier today. FINDINGS: Extensive areas of both focal and confluent restricted diffusion throughout the RIGHT hemisphere involving the frontal, temporal, insular, and parietal regions, largely RIGHT MCA territory, consistent with acute infarction. No visible hemorrhage, mass lesion, hydrocephalus, or extra-axial fluid. Premature for age cerebral and cerebellar atrophy. Mild subcortical and periventricular T2 and FLAIR hyperintensities, likely chronic microvascular ischemic change. Remote LEFT insular cortex infarct. Old LEFT cerebellar infarct. No chronic hemorrhage. Flow voids are maintained in the internal carotid arteries, basilar artery, and LEFT vertebral artery. RIGHT vertebral is diminutive. There is poor flow related enhancement in the RIGHT M1 MCA, representing either long segment stenosis or occlusion. No significant midline shift. Extracranial soft tissues unremarkable. No midline abnormality. IMPRESSION: Extensive areas of both focal and confluent restricted diffusion throughout the RIGHT MCA distribution representing acute nonhemorrhagic infarction. Suspected RIGHT M1 MCA long segment stenosis or occlusion. Atrophy with small vessel disease. Areas of chronic infarction, most notable LEFT insula and LEFT cerebellum. Electronically Signed By: Staci Righter M.D. On: 03/10/2016 19:25     Assessment/Plan: Diagnosis: Right MCA territory infarct Labs and images independently reviewed. Records reviewed and summated above. Stroke: Continue secondary stroke prophylaxis and Risk Factor Modification listed below:  Antiplatelet therapy:  Blood Pressure Management: Continue current medication with prn's with permisive HTN per primary team Statin Agent:  Diabetes management:  Left sided hemiparesis: fit for orthosis to prevent contractures (resting hand splint  for  day, wrist cock up splint at night, PRAFO, etc) Motor recovery: Fluoxetine  1. Does the need for close, 24 hr/day medical supervision in concert with the patient's rehab needs make it unreasonable for this patient to be served in a less intensive setting? Yes  2. Co-Morbidities requiring supervision/potential complications: HTN (monitor and provide prns in accordance with increased physical exertion and pain), obesity (encourage weight loss), hyperlipidemia (cont meds), left rotator cuff repair, tachypnea (monitor RR and O2 Sats with increased physical exertion), AKI (avoid nephrotoxic meds), hypothyroidism (cont meds, consider adjustment due to elevated TSH), obesity hypoventilation syndrome (cont CPAP, monitor for daytime somnolence) 3. Due to safety, skin/wound care, disease management, medication administration and patient education, does the patient require 24 hr/day rehab nursing? Yes 4. Does the patient require coordinated care of a physician, rehab nurse, PT (1-2 hrs/day, 5 days/week), OT (1-2 hrs/day, 5 days/week) and SLP (1-2 hrs/day, 5 days/week) to address physical and functional deficits in the context of the above medical diagnosis(es)? Yes Addressing deficits in the following areas: balance, endurance, locomotion, strength, transferring, bathing, dressing, toileting, speech and psychosocial support 5. Can the patient actively participate in an intensive therapy program of at least 3 hrs of therapy per day at least 5 days per week? Yes 6. The potential for patient to make measurable gains while on inpatient rehab is excellent 7. Anticipated functional outcomes upon discharge from inpatient rehab are supervision with PT, supervision and min assist with OT, modified independent with SLP. 8. Estimated rehab length of stay to reach the above functional goals is: 16-19 days. 9. Does the patient have adequate social supports and living environment to accommodate these discharge functional  goals? Potentially 10. Anticipated D/C setting: Home 11. Anticipated post D/C treatments: HH therapy and Home excercise program 12. Overall Rehab/Functional Prognosis: good  RECOMMENDATIONS: This patient's condition is appropriate for continued rehabilitative care in the following setting: CIR  Patient has agreed to participate in recommended program. Yes Note that insurance prior authorization may be required for reimbursement for recommended care.  Comment: Rehab Admissions Coordinator to follow up.  Delice Lesch, MD 03/11/2016       Revision History     Date/Time User Provider Type Action   03/12/2016 1:20 PM Ankit Lorie Phenix, MD Physician Sign   03/12/2016 5:46 AM Cathlyn Parsons, PA-C Physician Assistant Pend   View Details Report       Routing History     Date/Time From To Method   03/12/2016 1:20 PM Ankit Lorie Phenix, MD Marton Redwood, MD Fax

## 2016-03-14 NOTE — Care Management Important Message (Signed)
Important Message  Patient Details  Name: Danielle Vargas MRN: [id number] Date of Birth: 09-10-1950   Medicare Important Message Given:  Yes    Loann Quill 03/14/2016, 10:34 AM

## 2016-03-15 ENCOUNTER — Inpatient Hospital Stay (HOSPITAL_COMMUNITY): Payer: Medicare Other | Admitting: Speech Pathology

## 2016-03-15 ENCOUNTER — Inpatient Hospital Stay (HOSPITAL_COMMUNITY): Payer: Medicare Other | Admitting: Occupational Therapy

## 2016-03-15 ENCOUNTER — Inpatient Hospital Stay (HOSPITAL_COMMUNITY): Payer: Medicare Other | Admitting: Physical Therapy

## 2016-03-15 DIAGNOSIS — I10 Essential (primary) hypertension: Secondary | ICD-10-CM

## 2016-03-15 DIAGNOSIS — E1149 Type 2 diabetes mellitus with other diabetic neurological complication: Secondary | ICD-10-CM

## 2016-03-15 DIAGNOSIS — G441 Vascular headache, not elsewhere classified: Secondary | ICD-10-CM | POA: Insufficient documentation

## 2016-03-15 DIAGNOSIS — I63511 Cerebral infarction due to unspecified occlusion or stenosis of right middle cerebral artery: Secondary | ICD-10-CM

## 2016-03-15 DIAGNOSIS — E1142 Type 2 diabetes mellitus with diabetic polyneuropathy: Secondary | ICD-10-CM

## 2016-03-15 DIAGNOSIS — N390 Urinary tract infection, site not specified: Secondary | ICD-10-CM

## 2016-03-15 DIAGNOSIS — I6350 Cerebral infarction due to unspecified occlusion or stenosis of unspecified cerebral artery: Secondary | ICD-10-CM

## 2016-03-15 LAB — GLUCOSE, CAPILLARY
GLUCOSE-CAPILLARY: 235 mg/dL — AB (ref 65–99)
Glucose-Capillary: 167 mg/dL — ABNORMAL HIGH (ref 65–99)
Glucose-Capillary: 278 mg/dL — ABNORMAL HIGH (ref 65–99)
Glucose-Capillary: 268 mg/dL — ABNORMAL HIGH (ref 65–99)
Glucose-Capillary: 248 mg/dL — ABNORMAL HIGH (ref 65–99)

## 2016-03-15 MED ORDER — FLUOXETINE HCL 10 MG PO CAPS
10.0000 mg | ORAL_CAPSULE | Freq: Every day | ORAL | Status: DC
  Administered 2016-03-16 – 2016-04-02 (×18): 10 mg via ORAL
  Filled 2016-03-15 (×19): qty 1

## 2016-03-15 NOTE — Progress Notes (Signed)
[facility]     PROGRESS NOTE  Subjective/Complaints:  Pt laying in bed, being cleaned this AM.  She states she enjoyed watching golf yesterday.  She had a good first night and is ready to begin therapies.   ROS: Denies CP, SOB, N/V/D.  Objective: Vital Signs: Blood pressure 168/82, pulse 87, temperature 97.7 F (36.5 C), temperature source Oral, resp. rate 18, height 5\' 1"  (1.549 m), SpO2 97 %. No results found. No results for input(s): WBC, HGB, HCT, PLT in the last 72 hours.  Recent Labs  03/13/16 0519  NA 141  K 3.7  CL 112*  GLUCOSE 155*  BUN 14  CREATININE 0.85  CALCIUM 9.2   CBG (last 3)   Recent Labs  03/14/16 1653 03/14/16 2155 03/15/16 0645  GLUCAP 247* 184* 167*    Wt Readings from Last 3 Encounters:  03/13/16 96.9 kg (213 lb 10 oz)  03/04/16 100.245 kg (221 lb)  11/23/15 101.696 kg (224 lb 3.2 oz)    Physical Exam:  BP 168/82 mmHg  Pulse 87  Temp(Src) 97.7 F (36.5 C) (Oral)  Resp 18  Ht 5\' 1"  (1.549 m)  SpO2 97% Constitutional: She appears well-developed and well-nourished.  HENT: Normocephalic and atraumatic.  Cardiovascular: Normal rate and regular rhythm.  Respiratory: Effort normal and breath sounds normal. No respiratory distress.  GI: Soft. Bowel sounds are normal. She exhibits no distension.  Musculoskeletal: She exhibits no edema or tenderness.  Neurological: She is alert and oriented.  Follow simple commands.  Fair awareness of deficits Left facial weakness. Sensation intact to light touch Motor: RUE: 5/5 proximal to distal RLE: Hip flexion 4+/5, knee extension 4/5, ankle dorsi/plantar flexion 5/5 LUE: 0/5  LLE: able to wiggle toes, otherwise 0/5 Skin: Skin is warm and dry.  Psychiatric:  Mood, affect, behavior WNL.  Assessment/Plan: 1. 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 which require 3+ hours per day of  interdisciplinary therapy in a comprehensive inpatient rehab setting. Physiatrist is providing close team supervision and 24 hour management of active medical problems listed below. Physiatrist and rehab team continue to assess barriers to discharge/monitor patient progress toward functional and medical goals.  Function:  Bathing Bathing position      Bathing parts      Bathing assist        Upper Body Dressing/Undressing Upper body dressing                    Upper body assist        Lower Body Dressing/Undressing Lower body dressing                                  Lower body assist        Toileting Toileting          Toileting assist     Transfers Chair/bed transfer             Locomotion Ambulation           Wheelchair          Cognition Comprehension    Expression    Social Interaction    Problem Solving    Memory      Medical Problem List and Plan: 1. 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 Status post loop recorder  Begin CIR  Splints ordered  Fluoxetine started on 7/7 2. DVT Prophylaxis/Anticoagulation: SCDs. Monitor for any signs of DVT. Venous Doppler study negative 3. Pain Management: Neurontin 300 mg 3 times a day, Topamax 50 mg twice a day, hydrocodone as needed 4. Diabetes mellitus of peripheral neuropathy. Hemoglobin A1c 7.3. Lantus insulin 45 units twice a day, NovoLog 15 units 3 times a day. Check blood sugars before meals and at bedtime. Diabetic teaching  Will monitor with increased activity  5. Neuropsych: This patient is capable of making decisions on her own behalf. 6. Skin/Wound Care: Routine skin checks 7. Fluids/Electrolytes/Nutrition: Routine I&O 8. Hypertension. Coreg 6.25 mg twice a day  Will allow permissive HTN for time being 9. Hypothyroidism. Synthroid 10. Hyperlipidemia. Crestor, fenofibrate 11. Asthma. Singulair 10 mg  daily, Dulera 2 puffs twice daily, albuterol nebulizer as needed 12. Morbid Obesity. Body mass index is 40.39 kg/(m^2)., Diet and exercise education, cont to encourage weight loss to increase endurance and promote overall health. Dietary follow-up  13. [place] UTI. 7 day course of Vantin 200 mg every 12 hours times initiated 03/13/2016 14. Obesity hypoventilation syndrome: CPAP  LOS (Days) 1 A FACE TO FACE EVALUATION WAS PERFORMED  Ankit Lorie Phenix 03/15/2016 9:32 AM

## 2016-03-15 NOTE — Evaluation (Signed)
Speech Language Pathology Assessment and Plan  Patient Details  Name: Danielle Vargas MRN: [id number] Date of Birth: [birth year]  SLP Diagnosis: Cognitive Impairments  Rehab Potential: Good ELOS: 21-24 days     Today's Date: 03/15/2016 SLP Individual Time: 1440-1540 SLP Individual Time Calculation (min): 60 min   Problem List:  Patient Active Problem List   Diagnosis Date Noted  . DM type 2 with diabetic peripheral neuropathy ([place])   . Vascular headache   . Right middle cerebral artery stroke ([place]) 03/14/2016  . Asthma   . Acute lower UTI   . Benign essential HTN   . HLD (hyperlipidemia)   . S/P rotator cuff repair   . Tachypnea   . AKI (acute kidney injury) ([place])   . Acute right MCA stroke ([place])   . Back pain   . Ischemic stroke ([place]) 03/10/2016  . Stroke (cerebrum) ([place]) 03/10/2016  . Chest pain, rule out acute myocardial infarction 11/10/2015  . Chest pain 11/10/2015  . Obesity   . Hypothyroidism   . 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 UTI (urinary tract infection) 06/16/2015  . Diabetes mellitus ([place]) 06/16/2015  . RUQ abdominal pain 04/17/2015  . Hx of colonic polyps 04/17/2015  . Family hx of colon cancer 04/17/2015  . CPAP rhinitis 12/08/2014  . OSA on CPAP 12/08/2014  . 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 Retinopathy due to secondary diabetes mellitus, without macular edema, with moderate nonproliferative retinopathy ([place]) 09/18/2014  . Neuropathy due to secondary diabetes mellitus ([place]) 09/18/2014  . Diabetes mellitus due to underlying condition with other diabetic neurological complication ([place]) 15/17/6160  . Obesity hypoventilation syndrome ([place]) 09/18/2014  . CAD in native artery 01/23/2014  . Morbid obesity ([place]) 01/23/2014  . Renal insufficiency 11/17/2013  . HTN (hypertension) 10/29/2013  . Chronic diastolic heart failure ([place]) 10/29/2013  . 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 LADA (latent autoimmune diabetes in adults), managed as  type 1 ([place]) 08/08/2013   Past Medical History:  Past Medical History  Diagnosis Date  . DM (diabetes mellitus) ([place])   . Obesity   . Dyslipidemia   . HTN (hypertension)   . Hypothyroidism   . Gastroparesis   . Migraine   . Osteoporosis   . Colon polyps 7/07    hyperplastic and adenomatous  . Nephrolithiasis   . Diverticulosis   . External hemorrhoid   . Fatty liver   . Asthma   . CAD (coronary artery disease)     a. 12/2008 Cath: mild irregs throughout, EF 75%.  . 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 Allergy   . Cataract     "just beginning"  . Myocardial infarction ([place])     FEb 2015  . Depression     pt unsure, psychologist said no  . Sleep apnea     wears CPAP  . Hyperlipidemia    Past Surgical History:  Past Surgical History  Procedure Laterality Date  . Tubal ligation  '85  . Breast reduction surgery  '88  . Cervical disc surgery  '98    fusion  . Carpal tunnel release  2000    right wrist  . Rotator cuff repair  '09    left shoulder  . Ulnar nerve repair  '02, '08    left done, then right  . Cesarean section  '79  '84  . Left heart catheterization with coronary angiogram N/A 10/26/2013    Procedure: LEFT HEART CATHETERIZATION WITH CORONARY ANGIOGRAM;  Surgeon: Belva Crome III,  MD;  Location: [facility] CATH LAB;  Service: Cardiovascular;  Laterality: N/A;  . Colonoscopy    . Ep implantable device N/A 03/12/2016    Procedure: Loop Recorder Insertion;  Surgeon: Will Meredith Leeds, MD;  Location: [facility] CV LAB;  Service: Cardiovascular;  Laterality: N/A;  . Tee without cardioversion N/A 03/12/2016    Procedure: TRANSESOPHAGEAL ECHOCARDIOGRAM (TEE);  Surgeon: Sanda Klein, MD;  Location: [facility] ENDOSCOPY;  Service: Cardiovascular;  Laterality: N/A;    Assessment / Plan / Recommendation Clinical Impression   Danielle Vargas is a 65 y.o. right handed female with history of hypertension, obesity,Diabetes mellitus, hyperlipidemia and left rotator cuff repair.   Presented 03/10/2016 with left-sided weakness after a fall from bed. She denied any loss of consciousness MRI of the brain showed extensive areas of both focal and confluent restricted diffusion throughout the right MCA distribution. 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 On the afternoon of 03/11/2016 with increased increased right-sided weakness and slurred speech. Follow-up CT of the head showed no evidence of new infarct . MRI showed mild extension of acute right MCA infarct as compared to 03/10/2016. Patient was admitted for comprehensive rehabilitation program 03/14/2016.  SLP evaluation completed on 03/15/16 with the following results:  Pt presents with mild-moderate cognitive deficits characterized by poor safety awareness with left inattention and impulsivity, decreased emergent awareness, decreased task initiation, decreased selective attention to tasks, as well as decreased short term recall and working memory consistent with right hemisphere dysfunction.   Pt lived independently prior to admission and the abovementioned deficits will impact her ability to complete familiar self care tasks safely and independently.  As a result, pt would benefit from skilled ST while inpatient in order to maximize functional independence and reduce burden of care prior to discharge.  Anticipate that pt will need 24/7 supervision at discharge in addition to [place] follow up at next level of care.     Skilled Therapeutic Interventions          Cognitive-linguistic evaluation completed with results and recommendations reviewed with family.  Pt scored WFL on all subtests of [place] Brief Inventory of Communication and Cognition for Right Hemisphere although she presented functionally with the abovementioned deficits.  Discussed findings of evaluation and recommendations with pt and family member, both of whom are in agreement with recommended plan of care.  All questions were answered to their  satisfaction at this time.      SLP Assessment  Patient will need skilled [facility] during CIR admission    Recommendations  Patient destination: Home Follow up Recommendations: Home Health SLP;Outpatient SLP;24 hour supervision/assistance Equipment Recommended: None recommended by SLP    SLP Frequency 3 to 5 out of 7 days   SLP Duration  SLP Intensity  SLP Treatment/Interventions 21-24 days   Minumum of 1-2 x/day, 30 to 90 minutes  Cognitive remediation/compensation;Cueing hierarchy;Functional tasks;Environmental controls;Internal/external aids    Pain Pain Assessment Pain Assessment: 0-10 Pain Score: 9  Pain Location: Hip Pain Orientation: Right Pain Descriptors / Indicators: Aching Pain Onset: On-going Patients Stated Pain Goal: 5 Pain Intervention(s): RN made aware;Repositioned  Prior Functioning Cognitive/Linguistic Baseline: Within functional limits Type of Home: Apartment  Lives With: Alone Available Help at Discharge: Family;Available PRN/intermittently Vocation: On disability  Function:  Eating Eating               Cognition Comprehension Comprehension assist level: Follows basic conversation/direction with no assist  Expression   Expression assist level: Expresses  basic needs/ideas: With no assist  Social Interaction Social Interaction assist level: Interacts appropriately 75 - 89% of the time - Needs redirection for appropriate language or to initiate interaction.  Problem Solving Problem solving assist level: Solves basic 75 - 89% of the time/requires cueing 10 - 24% of the time  Memory Memory assist level: Recognizes or recalls 75 - 89% of the time/requires cueing 10 - 24% of the time   Short Term Goals: Week 1: SLP Short Term Goal 1 (Week 1): Pt will attend to her left upper extremity during basic functional tasks with supervision verbal cues.   SLP Short Term Goal 2 (Week 1): Pt will utilize external memory aids to  facilitate recall of information with supervision verbal cues.   SLP Short Term Goal 3 (Week 1): Pt will complete semi-complex familiar tasks with supervision verbal cues for functional problem solving.   SLP Short Term Goal 4 (Week 1): Pt will return demonstration of at least 2 safety precautions with min assist verbal cues.   SLP Short Term Goal 5 (Week 1): Pt will selectively attend to semi-complex tasks for 15 minute intervals in mildly distracting environment with supervision verbal cues for redirection.    Refer to Care Plan for Long Term Goals  Recommendations for other services: None  Discharge Criteria: Patient will be discharged from SLP if patient refuses treatment 3 consecutive times without medical reason, if treatment goals not met, if there is a change in medical status, if patient makes no progress towards goals or if patient is discharged from hospital.  The above assessment, treatment plan, treatment alternatives and goals were discussed and mutually agreed upon: by patient and by family  Emilio Math 03/15/2016, 4:50 PM

## 2016-03-15 NOTE — Evaluation (Signed)
Occupational Therapy Assessment and Plan  Patient Details  Name: Danielle Vargas MRN: [id number] Date of Birth: Jun 19, 1951  OT Diagnosis: cognitive deficits and hemiplegia affecting non-dominant side Rehab Potential: Rehab Potential (ACUTE ONLY): Good ELOS: 21-24 days   Today's Date: 03/15/2016 OT Individual Time: 6144-3246 OT Individual Time Calculation (min): 75 min     Problem List:  Patient Active Problem List   Diagnosis Date Noted  . DM type 2 with diabetic peripheral neuropathy (HCC)   . Vascular headache   . 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 Asthma   . Acute lower UTI   . Benign essential HTN   . HLD (hyperlipidemia)   . S/P rotator cuff repair   . Tachypnea   . AKI (acute kidney injury) (HCC)   . Acute right MCA stroke (HCC)   . Back pain   . Ischemic stroke (HCC) 03/10/2016  . Stroke (cerebrum) (HCC) 03/10/2016  . Chest pain, rule out acute myocardial infarction 11/10/2015  . Chest pain 11/10/2015  . Obesity   . Hypothyroidism   . 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 UTI (urinary tract infection) 06/16/2015  . Diabetes mellitus (HCC) 06/16/2015  . RUQ abdominal pain 04/17/2015  . Hx of colonic polyps 04/17/2015  . Family hx of colon cancer 04/17/2015  . CPAP rhinitis 12/08/2014  . OSA on CPAP 12/08/2014  . 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 Retinopathy due to secondary diabetes mellitus, without macular edema, with moderate nonproliferative retinopathy (HCC) 09/18/2014  . Neuropathy due to secondary diabetes mellitus (HCC) 09/18/2014  . Diabetes mellitus due to underlying condition with other diabetic neurological complication (HCC) 09/18/2014  . Obesity hypoventilation syndrome (HCC) 09/18/2014  . CAD in native artery 01/23/2014  . Morbid obesity (HCC) 01/23/2014  . Renal insufficiency 11/17/2013  . HTN (hypertension) 10/29/2013  . Chronic diastolic heart failure (HCC) 10/29/2013  . 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  LADA (latent autoimmune diabetes in adults), managed as type 1 (HCC) 08/08/2013    Past Medical History:  Past Medical History  Diagnosis Date  . DM (diabetes mellitus) (HCC)   . Obesity   . Dyslipidemia   . HTN (hypertension)   . Hypothyroidism   . Gastroparesis   . Migraine   . Osteoporosis   . Colon polyps 7/07    hyperplastic and adenomatous  . Nephrolithiasis   . Diverticulosis   . External hemorrhoid   . Fatty liver   . Asthma   . CAD (coronary artery disease)     a. 12/2008 Cath: mild irregs throughout, EF 75%.  . 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 Allergy   . Cataract     "just beginning"  . Myocardial infarction (HCC)     FEb 2015  . Depression     pt unsure, psychologist said no  . Sleep apnea     wears CPAP  . Hyperlipidemia    Past Surgical History:  Past Surgical History  Procedure Laterality Date  . Tubal ligation  '85  . Breast reduction surgery  '88  . Cervical disc surgery  '98    fusion  . Carpal tunnel release  2000    right wrist  . Rotator cuff repair  '09    left shoulder  . Ulnar nerve repair  '02, '08    left done, then right  . Cesarean section  '79  '84  . Left heart catheterization with coronary angiogram N/A 10/26/2013    Procedure: LEFT HEART CATHETERIZATION WITH  CORONARY ANGIOGRAM;  Surgeon: Sinclair Grooms, MD;  Location[facility] CATH LAB;  Service: Cardiovascular;  Laterality: N/A;  . Colonoscopy    . Ep implantable device N/A 03/12/2016    Procedure: Loop Recorder Insertion;  Surgeon: Will Meredith Leeds, MD;  Location: [facility] CV LAB;  Service: Cardiovascular;  Laterality: N/A;  . Tee without cardioversion N/A 03/12/2016    Procedure: TRANSESOPHAGEAL ECHOCARDIOGRAM (TEE);  Surgeon: Sanda Klein, MD;  Location: [facility] ENDOSCOPY;  Service: Cardiovascular;  Laterality: N/A;    Assessment & Plan Clinical Impression:  Danielle Vargas is a 65 y.o. right handed female with history of hypertension, obesity,Diabetes mellitus,  hyperlipidemia and left rotator cuff repair. Presented 03/10/2016 with left-sided weakness after a fall from bed. She denied any loss of consciousness MRI of the brain showed extensive areas of both focal and confluent restricted diffusion throughout the right MCA distribution. CTA suspect right M1-MCA long segment stenosis or occlusion. 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 Patient did not receive TPA. Echocardiogram completed with limited study. The left ventricle appeared normal in overall size and function.On the afternoon of 03/11/2016 with increased increased right-sided weakness and slurred speech. Follow-up CT of the head showed no evidence of new infarct . MRI showed mild extension of acute right MCA infarct as compared to 03/10/2016 felt to be secondary hypotension episodes.  Neurology services recommended to continue Plavix. TEE 03/12/2016 showing no cardiac source of embolism. LVH LAD dilation present as well as placement of loop recorder.  Neurology consulted currently on Plavix for CVA prophylaxis. Morganella UTI completing 7 day course of Vantin initiated 03/13/2016.   Patient transferred to CIR on 03/14/2016 .    Patient currently requires total with basic self-care skills secondary to muscle weakness, decreased cardiorespiratoy endurance, abnormal tone, decreased attention to left, decreased problem solving and decreased safety awareness and decreased sitting balance, decreased postural control and hemiplegia.  Prior to hospitalization, patient could complete ADLs with independent . Pt was living alone and driving.  Patient will benefit from skilled intervention to increase independence with basic self-care skills prior to discharge to SNF.Marland Kitchen  Anticipate patient will require moderate physical assestance and OT in SNF.  OT - End of Session Activity Tolerance: Tolerates 10 - 20 min activity with multiple rests OT Assessment Rehab Potential (ACUTE ONLY):  Good Barriers to Discharge: Decreased caregiver support Barriers to Discharge Comments: daughter works full time OT Patient demonstrates impairments in the following area(s): Balance;Cognition;Endurance;Motor;Pain;Perception;Safety;Sensory OT Basic ADL's Functional Problem(s): Grooming;Bathing;Dressing;Toileting OT Transfers Functional Problem(s): Toilet;Tub/Shower OT Additional Impairment(s): Fuctional Use of Upper Extremity OT Plan OT Intensity: Minimum of 1-2 x/day, 45 to 90 minutes OT Frequency: 5 out of 7 days OT Duration/Estimated Length of Stay: 21-24 days OT Treatment/Interventions: Balance/vestibular training;Cognitive remediation/compensation;Discharge planning;DME/adaptive equipment instruction;Functional mobility training;Neuromuscular re-education;Patient/family education;Self Care/advanced ADL retraining;Therapeutic Activities;Therapeutic Exercise;UE/LE Strength taining/ROM;UE/LE Coordination activities;Visual/perceptual remediation/compensation;Pain management OT Self Feeding Anticipated Outcome(s): set up  OT Basic Self-Care Anticipated Outcome(s): mod A OT Toileting Anticipated Outcome(s): max A OT Bathroom Transfers Anticipated Outcome(s): mod A OT Recommendation Patient destination: [facility] (SNF) Follow Up Recommendations: Skilled nursing facility Equipment Recommended: To be determined   Skilled Therapeutic Intervention Pt seen for initial evaluation and ADL retraining. Pt's daughter present. Pt engaged well in session, but did demonstrate some impulsivity and decreased safety awareness.  Self care conducted from w/c level as pt was not able to maintain safe sitting EOB. Pt unable to wt shift forward for adequate scooting on bed as she  is only 5'1" and bed was too high for her feet to contact the floor, therefore a squat pivot transfer was not safe to initiate without a +2.  Used bariatric stedy to transfer pt bed to w/c. Pt did extremely well with sit to  stand to Steady. She was unable to maintain balance for more than a few seconds in full standing but could tolerate sitting back on seat. Reviewed purpose of OT, pt's goals, pt's PLOF and methods for self care.  Pt in w/c with all needs present.    OT Evaluation Precautions/Restrictions  Precautions Precautions: Fall Restrictions Weight Bearing Restrictions: No Vital Signs Therapy Vitals Pulse Rate: 94 Resp: 18 BP: (!) 160/63 mmHg Patient Position (if appropriate): Sitting Oxygen Therapy SpO2: 98 % O2 Device: Not Delivered Pain Pain Assessment Pain Assessment: 0-10 Pain Score: 3  Pain Intervention(s): Medication (See eMAR) Home Living/Prior Functioning Home Living Family/patient expects to be discharged to:: Private residence Living Arrangements: Alone Available Help at Discharge: Family, Available PRN/intermittently Type of Home: Apartment Home Access: Level entry Home Layout: One level Bathroom Shower/Tub: Gaffer, Charity fundraiser: Handicapped height Additional Comments: able to drive  Lives With: Alone Prior Function Level of Independence: Independent with basic ADLs, Independent with homemaking with ambulation, Independent with transfers, Independent with gait Driving: Yes Vocation: On disability ADL ADL ADL Comments: refer to functional navigator Vision/Perception  Vision- History Baseline Vision/History: Wears glasses Wears Glasses: Reading only Patient Visual Report: No change from baseline Vision- Assessment Vision Assessment?: No apparent visual deficits;Yes  Cognition Overall Cognitive Status: Impaired/Different from baseline Arousal/Alertness: Awake/alert Orientation Level: Person;Place;Situation Person: Oriented Place: Oriented Situation: Oriented Year: 2017 Month: July Day of Week: Correct Memory: Appears intact Immediate Memory Recall: Sock;Blue;Bed Memory Recall: Sock;Blue;Bed Memory Recall Sock: Without Cue Memory Recall Blue:  Without Cue Memory Recall Bed: Without Cue Awareness: Impaired Awareness Impairment: Emergent impairment Problem Solving: Impaired Problem Solving Impairment: Functional basic Behaviors: Impulsive Safety/Judgment: Impaired Sensation Sensation Light Touch: Impaired by gross assessment Stereognosis: Impaired by gross assessment Hot/Cold: Not tested Proprioception: Impaired by gross assessment Coordination Gross Motor Movements are Fluid and Coordinated: No Fine Motor Movements are Fluid and Coordinated: No Coordination and Movement Description: severe L hemiplegia  Finger Nose Finger Test: not tested Motor  Motor Motor: Hemiplegia Motor - Skilled Clinical Observations: slight movement in L shoulder and L ankle/foot Mobility    refer to functional navigator Trunk/Postural Assessment  Lumbar Assessment Lumbar Assessment: Exceptions to [facility] (posterior pelvic tilt, pelvic tilt to L) Postural Control Postural Control: Deficits on evaluation Trunk Control: mod impaired with strong L lean in sitting Righting Reactions: max impaired to L Protective Responses: max impaired to L  Balance Static Sitting Balance Static Sitting - Level of Assistance: 4: Min assist Dynamic Sitting Balance Dynamic Sitting - Level of Assistance: 2: Max assist Static Standing Balance Static Standing - Level of Assistance: 1: +2 Total assist (pt able to pull self to stand in steady but not maintain without R hand support) Extremity/Trunk Assessment RUE Assessment RUE Assessment: Within Functional Limits LUE Assessment LUE Assessment: Exceptions to [facility] (hypotonic with slight tone in hand, slight active movement in shoulder)   See Function Navigator for Current Functional Status.   Refer to Care Plan for Long Term Goals  Recommendations for other services: None  Discharge Criteria: Patient will be discharged from OT if patient refuses treatment 3 consecutive times without medical reason, if treatment  goals not met, if there is a change in medical status, if patient makes  no progress towards goals or if patient is discharged from hospital.  The above assessment, treatment plan, treatment alternatives and goals were discussed and mutually agreed upon: by patient and by family  SAGUIER,JULIA 03/15/2016, 1:07 PM

## 2016-03-16 LAB — GLUCOSE, CAPILLARY
GLUCOSE-CAPILLARY: 141 mg/dL — AB (ref 65–99)
GLUCOSE-CAPILLARY: 210 mg/dL — AB (ref 65–99)
Glucose-Capillary: 240 mg/dL — ABNORMAL HIGH (ref 65–99)
Glucose-Capillary: 260 mg/dL — ABNORMAL HIGH (ref 65–99)

## 2016-03-16 MED ORDER — INSULIN ASPART 100 UNIT/ML ~~LOC~~ SOLN: 18.0000 [IU] | Freq: Three times a day (TID) | SUBCUTANEOUS | Status: DC

## 2016-03-16 MED ORDER — INSULIN ASPART 100 UNIT/ML ~~LOC~~ SOLN
20.0000 [IU] | Freq: Three times a day (TID) | SUBCUTANEOUS | Status: DC
  Administered 2016-03-16 – 2016-04-02 (×49): 20 [IU] via SUBCUTANEOUS

## 2016-03-16 NOTE — Progress Notes (Signed)
[facility]ICINE [facility] RE[facility]ABILITATION     PROGRESS NOTE  Subjective/Complaints:  Pt did not wish to take Prozaac yesterday, but now is willing.  She states she would like to discuss potential benefits and side effects of all medications with a physician before being prescribed something.   ROS: Denies CP, SOB, N/V/D.  Objective: Vital Signs: Blood pressure 172/56, pulse 82, temperature 97.9 F (36.6 C), temperature source Oral, resp. rate 18, height 5\' 1"  (1.549 m), SpO2 94 %. No results found. No results for input(s): WBC, HGB, HCT, PLT in the last 72 hours. No results for input(s): NA, K, CL, GLUCOSE, BUN, CREATININE, CALCIUM in the last 72 hours.  Invalid input(s): CO CBG (last 3)   Recent Labs  03/15/16 1630 03/15/16 2038 03/16/16 0636  GLUCAP 248* 235* 141*    Wt Readings from Last 3 Encounters:  03/13/16 96.9 kg (213 lb 10 oz)  03/04/16 100.245 kg (221 lb)  11/23/15 101.696 kg (224 lb 3.2 oz)    Physical Exam:  BP 172/56 mmHg  Pulse 82  Temp(Src) 97.9 F (36.6 C) (Oral)  Resp 18  Ht 5\' 1"  (1.549 m)  SpO2 94% Constitutional: She appears well-developed and well-nourished.  HENT: Normocephalic and atraumatic.  Cardiovascular: Normal rate and regular rhythm.  Respiratory: Effort normal and breath sounds normal. No respiratory distress.  GI: Soft. Bowel sounds are normal. She exhibits no distension.  Musculoskeletal: She exhibits no edema or tenderness.  Neurological: She is alert and oriented.  Follow commands.  Left facial weakness. Motor: RUE: 5/5 proximal to distal RLE: Hip flexion 4+/5, knee extension 4/5, ankle dorsi/plantar flexion 5/5 LUE: 0/5  LLE: able to wiggle toes, otherwise 0/5 Skin: Skin is warm and dry.  Psychiatric:  Mood, affect, behavior WNL.  Assessment/Plan: 1. 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 which require 3+ hours per day of interdisciplinary therapy  in a comprehensive inpatient rehab setting. Physiatrist is providing close team supervision and 24 hour management of active medical problems listed below. Physiatrist and rehab team continue to assess barriers to discharge/monitor patient progress toward functional and medical goals.  Function:  Bathing Bathing position   Position: Wheelchair/chair at sink  Bathing parts Body parts bathed by patient: Left arm, Chest, Abdomen, Right upper leg, Left upper leg Body parts bathed by helper: Right arm, Front perineal area, Buttocks, Right lower leg, Left lower leg, Back  Bathing assist        Upper Body Dressing/Undressing Upper body dressing   What is the patient wearing?: Hospital gown                Upper body assist        Lower Body Dressing/Undressing Lower body dressing   What is the patient wearing?: Hospital Gown, Non-skid slipper socks           Non-skid slipper socks- Performed by helper: Don/doff right sock, Don/doff left sock                  Lower body assist        Toileting Toileting          Toileting assist     Transfers Chair/bed transfer   Chair/bed transfer method: Stand pivot Chair/bed transfer assist level: Maximal assist (Pt 25 - 49%/lift and lower) Chair/bed transfer assistive device: Armrests Mechanical lift: Stedy   Locomotion Ambulation Ambulation activity did not occur: Safety/medical concerns  Wheelchair   Type: Manual Max wheelchair distance: 13ft Assist Level: Maximal assistance (Pt 25 - 49%)  Cognition Comprehension Comprehension assist level: Follows basic conversation/direction with no assist  Expression Expression assist level: Expresses basic needs/ideas: With no assist  Social Interaction Social Interaction assist level: Interacts appropriately 75 - 89% of the time - Needs redirection for appropriate language or to initiate interaction.  Problem Solving Problem solving assist level: Solves basic 75 -  89% of the time/requires cueing 10 - 24% of the time  Memory Memory assist level: Recognizes or recalls 75 - 89% of the time/requires cueing 10 - 24% of the time    Medical Problem List and Plan: 1. 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 Status post loop recorder  Cont CIR  Splints ordered, pt has not received  Fluoxetine started on 7/7, but pt refused, now, willing to take 2. DVT Prophylaxis/Anticoagulation: SCDs. Monitor for any signs of DVT. Venous Doppler study negative 3. Pain Management: Neurontin 300 mg 3 times a day, Topamax 50 mg twice a day, hydrocodone as needed 4. Diabetes mellitus of peripheral neuropathy. Hemoglobin A1c 7.3.   Lantus insulin 45 units twice a day  NovoLog 15 units 3 times a day, increased to 20U on 7/9.  Check blood sugars before meals and at bedtime. Diabetic teaching  Will monitor with increased activity  5. Neuropsych: This patient is capable of making decisions on her own behalf. 6. Skin/Wound Care: Routine skin checks 7. Fluids/Electrolytes/Nutrition: Routine I&O 8. Hypertension. Coreg 6.25 mg twice a day  Will continue to allow permissive HTN for time being 9. Hypothyroidism. Synthroid 10. Hyperlipidemia. Crestor, fenofibrate 11. Asthma. Singulair 10 mg daily, Dulera 2 puffs twice daily, albuterol nebulizer as needed 12. Morbid Obesity. Body mass index is 40.39 kg/(m^2)., Diet and exercise education, cont to encourage weight loss to increase endurance and promote overall health. Dietary follow-up  13. Morganella UTI. 7 day course of Vantin 200 mg every 12 hours times initiated 03/13/2016 14. Obesity hypoventilation syndrome: CPAP  LOS (Days) 2 A FACE TO FACE EVALUATION WAS PERFORMED  Danielle Vargas Danielle Vargas 03/16/2016 8:38 AM

## 2016-03-16 NOTE — Progress Notes (Signed)
CPAP placed on pt. for h/s, tolerating well on room air, RT to monitor.

## 2016-03-16 NOTE — Evaluation (Signed)
Physical Therapy Assessment and Plan  Patient Details  Name: Danielle Vargas MRN: [id number] Date of Birth: 1951-01-24  PT Diagnosis: Abnormality of gait, Difficulty walking, Hemiplegia non-dominant, Hypotonia and Impaired sensation Rehab Potential: Good ELOS: 3-3.5 weeks   Today's Date: 03/15/2016 PT Individual Time: 1300-1409 PT Individual Time Calculation (min): 69 min    Problem List:  Patient Active Problem List   Diagnosis Date Noted  . DM type 2 with diabetic peripheral neuropathy ([place])   . Vascular headache   . Right middle cerebral artery stroke ([place]) 03/14/2016  . Asthma   . Acute lower UTI   . Benign essential HTN   . HLD (hyperlipidemia)   . S/P rotator cuff repair   . Tachypnea   . AKI (acute kidney injury) ([place])   . Acute right MCA stroke ([place])   . Back pain   . Ischemic stroke ([place]) 03/10/2016  . Stroke (cerebrum) ([place]) 03/10/2016  . Chest pain, rule out acute myocardial infarction 11/10/2015  . Chest pain 11/10/2015  . Obesity   . Hypothyroidism   . 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 UTI (urinary tract infection) 06/16/2015  . Diabetes mellitus ([place]) 06/16/2015  . RUQ abdominal pain 04/17/2015  . Hx of colonic polyps 04/17/2015  . Family hx of colon cancer 04/17/2015  . CPAP rhinitis 12/08/2014  . OSA on CPAP 12/08/2014  . 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 Retinopathy due to secondary diabetes mellitus, without macular edema, with moderate nonproliferative retinopathy ([place]) 09/18/2014  . Neuropathy due to secondary diabetes mellitus ([place]) 09/18/2014  . Diabetes mellitus due to underlying condition with other diabetic neurological complication ([place]) 09/32/6712  . Obesity hypoventilation syndrome ([place]) 09/18/2014  . CAD in native artery 01/23/2014  . Morbid obesity ([place]) 01/23/2014  . Renal insufficiency 11/17/2013  . HTN (hypertension) 10/29/2013  . Chronic diastolic heart failure ([place]) 10/29/2013  . Old myocardial infarction 10/26/2013   . LADA (latent autoimmune diabetes in adults), managed as type 1 ([place]) 08/08/2013    Past Medical History:  Past Medical History  Diagnosis Date  . DM (diabetes mellitus) ([place])   . Obesity   . Dyslipidemia   . HTN (hypertension)   . Hypothyroidism   . Gastroparesis   . Migraine   . Osteoporosis   . Colon polyps 7/07    hyperplastic and adenomatous  . Nephrolithiasis   . Diverticulosis   . External hemorrhoid   . Fatty liver   . Asthma   . CAD (coronary artery disease)     a. 12/2008 Cath: mild irregs throughout, EF 75%.  . 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 Allergy   . Cataract     "just beginning"  . Myocardial infarction ([place])     FEb 2015  . Depression     pt unsure, psychologist said no  . Sleep apnea     wears CPAP  . Hyperlipidemia    Past Surgical History:  Past Surgical History  Procedure Laterality Date  . Tubal ligation  '85  . Breast reduction surgery  '88  . Cervical disc surgery  '98    fusion  . Carpal tunnel release  2000    right wrist  . Rotator cuff repair  '09    left shoulder  . Ulnar nerve repair  '02, '08    left done, then right  . Cesarean section  '79  '84  . Left heart catheterization with coronary angiogram N/A 10/26/2013    Procedure: LEFT HEART CATHETERIZATION WITH CORONARY  Cyril Loosen;  Surgeon: Sinclair Grooms, MD;  [facility] CATH LAB;  Service: Cardiovascular;  Laterality: N/A;  . Colonoscopy    . Ep implantable device N/A 03/12/2016    Procedure: Loop Recorder Insertion;  Surgeon: Will Meredith Leeds, MD;  Location: [facility] CV LAB;  Service: Cardiovascular;  Laterality: N/A;  . Tee without cardioversion N/A 03/12/2016    Procedure: TRANSESOPHAGEAL ECHOCARDIOGRAM (TEE);  Surgeon: Sanda Klein, MD;  Location: [facility] ENDOSCOPY;  Service: Cardiovascular;  Laterality: N/A;    Assessment & Plan Clinical Impression: Patient is a 65 y.o. right handed female with history of hypertension, obesity,Diabetes mellitus,  hyperlipidemia and left rotator cuff repair. Per chart review patient lives alone and was independent prior to admission and active. One level apartment. She has a daughter in the area that works. Presented 03/10/2016 with left-sided weakness after a fall from bed. She denied any loss of consciousness MRI of the brain showed extensive areas of both focal and confluent restricted diffusion throughout the right MCA distribution. CTA suspect right M1-MCA long segment stenosis or occlusion. 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 Patient did not receive TPA. Echocardiogram completed with limited study. The left ventricle appeared normal in overall size and function.On the afternoon of 03/11/2016 with increased increased right-sided weakness and slurred speech. Follow-up CT of the head showed no evidence of new infarct . MRI showed mild extension of acute right MCA infarct as compared to 03/10/2016. Patient transferred to CIR on 03/14/2016 .   Patient currently requires max with mobility secondary to muscle weakness, decreased cardiorespiratoy endurance, impaired timing and sequencing and unbalanced muscle activation, decreased midline orientation and left side neglect and decreased sitting balance, decreased standing balance, decreased postural control, hemiplegia and decreased balance strategies.  Prior to hospitalization, patient was independent  with mobility and lived with Alone in a [place] home.  Home access is  Level entry.  Patient will benefit from skilled PT intervention to maximize safe functional mobility, minimize fall risk and decrease caregiver burden for planned discharge home with 24 hour supervision.  Anticipate patient will benefit from follow up [place] at discharge.  PT - End of Session Activity Tolerance: Tolerates 10 - 20 min activity with multiple rests Endurance Deficit: Yes PT Assessment Rehab Potential (ACUTE/IP ONLY): Good Barriers to Discharge: Decreased  caregiver support PT Patient demonstrates impairments in the following area(s): Balance;Endurance;Motor;Pain;Perception;Safety;Sensory PT Transfers Functional Problem(s): Bed Mobility;Bed to Chair;Car;Furniture PT Locomotion Functional Problem(s): Ambulation;Wheelchair Mobility;Stairs PT Plan PT Intensity: Minimum of 1-2 x/day ,45 to 90 minutes PT Frequency: 5 out of 7 days PT Duration Estimated Length of Stay: 3-3.5 weeks PT Treatment/Interventions: Ambulation/gait training;Balance/vestibular training;Cognitive remediation/compensation;Community reintegration;Discharge planning;Disease management/prevention;DME/adaptive equipment instruction;Functional electrical stimulation;Functional mobility training;Neuromuscular re-education;Pain management;Patient/family education;Psychosocial support;Skin care/wound management;Splinting/orthotics;Stair training;Therapeutic Activities;Therapeutic Exercise;UE/LE Strength taining/ROM;UE/LE Coordination activities;Visual/perceptual remediation/compensation;Wheelchair propulsion/positioning PT Transfers Anticipated Outcome(s): Supervision-Min A with LRAD PT Locomotion Anticipated Outcome(s): Supervision A with WC. Min A for household Ambulation with LRAD.  PT Recommendation Follow Up Recommendations: Home health PT Patient destination: Home Equipment Recommended: Wheelchair (measurements);Wheelchair cushion (measurements);To be determined     Skilled Therapeutic Intervention  Patient received sitting in [facility] and agreeable to PT. PT performed Evaluation and initiated treatment intervention; See below for results. Patient performed sit<>stand with mod A in stedy with min cues for positioning and use of R LE. Sit<>stand performed x 5 throughout therapy with max A and PT to block knee. Weight shifting in stance performed with max A from PT to prepare for gait activities.  Patient was able to correct lean to L with activation of the LLE with verbal cues from PT    Following session patient left in [facility] with call bell within reach.   PT Evaluation Precautions/Restrictions   Fall.  General   Vital SignsTherapy Vitals Temp: 97.9 F (36.6 C) Temp Source: Oral Pulse Rate: 82 Resp: 18 BP: (!) 172/56 mmHg Patient Position (if appropriate): Lying Oxygen Therapy SpO2: 94 % O2 Device: Not Delivered Pain   Home Living/Prior Functioning Home Living Available Help at Discharge: Family;Available PRN/intermittently Type of Home: Apartment Home Access: Level entry Home Layout: One level Bathroom Shower/Tub: Walk-in shower;Door Bathroom Toilet: Handicapped height Additional Comments: able to drive  Lives With: Alone Prior Function Level of Independence: Independent with basic ADLs;Independent with homemaking with ambulation;Independent with transfers;Independent with gait Driving: Yes Vocation: On disability Vision/Perception     Cognition Overall Cognitive Status: Impaired/Different from baseline Arousal/Alertness: Awake/alert Memory: Appears intact Awareness: Impaired Awareness Impairment: Emergent impairment Problem Solving: Impaired Problem Solving Impairment: Functional basic Behaviors: Impulsive Safety/Judgment: Impaired Sensation Sensation Light Touch: Impaired by gross assessment Stereognosis: Impaired by gross assessment Hot/Cold: Not tested Proprioception: Impaired by gross assessment Coordination Gross Motor Movements are Fluid and Coordinated: No Fine Motor Movements are Fluid and Coordinated: No Coordination and Movement Description: L hemiplegia  Finger Nose Finger Test: not tested Motor  Motor Motor: Hemiplegia Motor - Skilled Clinical Observations: slight movement in L shoulder and L ankle/foot. near full range at the knee and hip flexion, trace amounts of hip extension.   Mobility Bed Mobility Bed Mobility: Rolling Right;Rolling Left;Supine to Sit;Sit to Supine Rolling Right: 2: Max assist Rolling Right  Details: Verbal cues for sequencing;Verbal cues for technique;Manual facilitation for placement;Manual facilitation for weight shifting;Tactile cues for placement;Tactile cues for weight shifting Rolling Left: 3: Mod assist Rolling Left Details: Verbal cues for sequencing;Verbal cues for technique;Manual facilitation for placement;Tactile cues for placement;Verbal cues for precautions/safety;Manual facilitation for weight shifting Supine to Sit: 2: Max assist Supine to Sit Details: Verbal cues for sequencing;Verbal cues for technique;Manual facilitation for placement;Verbal cues for precautions/safety Sit to Supine: 2: Max assist Sit to Supine - Details: Verbal cues for sequencing;Verbal cues for technique;Tactile cues for placement;Manual facilitation for placement;Verbal cues for precautions/safety Transfers Transfers: Yes Sit to Stand: 2: Max assist Sit to Stand Details: Verbal cues for sequencing;Verbal cues for technique;Verbal cues for precautions/safety;Tactile cues for weight shifting;Tactile cues for placement;Tactile cues for posture Stand Pivot Transfers: 2: Max assist Stand Pivot Transfer Details: Verbal cues for sequencing;Verbal cues for technique;Verbal cues for gait pattern;Verbal cues for precautions/safety;Manual facilitation for placement;Tactile cues for placement Locomotion  Ambulation Ambulation: No Gait Gait: No Stairs / Additional Locomotion Stairs: No Wheelchair Mobility Wheelchair Mobility: Yes Wheelchair Assistance: 2: Max Lexicographer: Right upper extremity Wheelchair Parts Management: Needs assistance Distance: 176f  Trunk/Postural Assessment  Cervical Assessment Cervical Assessment: Within Functional Limits Thoracic Assessment Thoracic Assessment: Ex[facility] to [facility](flexed posture) Lumbar Assessment Lumbar Assessment: Exceptions to [facility](posterior pelvic tilt, pelvic tilt to L) Postural Control Postural Control: Deficits on  evaluation Trunk Control: mod impaired with slight L lean in sitting Righting Reactions:  impaired to L Protective Responses:  impaired to L  Balance Static Sitting Balance Static Sitting - Level of Assistance: 4: Min assist Dynamic Sitting Balance Dynamic Sitting - Level of Assistance: 2: Max assist Static Standing Balance Static Standing - Level of Assistance: 2: Max assist (R UE support on therapist and L knee blocked) Extremity Assessment  RLE Assessment RLE Assessment: Within Functional Limits LLE Assessment LLE Assessment: Exceptions to [facility] LLE Strength Left Hip Flexion: 3-/5 Left Hip Extension: 1/5 Left Hip ABduction: 2-/5 Left Hip ADduction: 2+/5 Left Knee Flexion: 3-/5 Left Knee Extension: 3-/5 Left Ankle Dorsiflexion: 1/5 Left Ankle Plantar Flexion: 3/5   See Function Navigator for Current Functional Status.   Refer to Care Plan for Long Term Goals  Recommendations for other services: None  Discharge Criteria: Patient will be discharged from PT if patient refuses treatment 3 consecutive times without medical reason, if treatment goals not met, if there is a change in medical status, if patient makes no progress towards goals or if patient is discharged from hospital.  The above assessment, treatment plan, treatment alternatives and goals were discussed and mutually agreed upon: by patient and by family  Lorie Phenix 03/16/2016, 7:00 AM

## 2016-03-17 ENCOUNTER — Inpatient Hospital Stay (HOSPITAL_COMMUNITY): Payer: Medicare Other | Admitting: Speech Pathology

## 2016-03-17 ENCOUNTER — Inpatient Hospital Stay (HOSPITAL_COMMUNITY): Payer: Medicare Other | Admitting: Occupational Therapy

## 2016-03-17 ENCOUNTER — Inpatient Hospital Stay (HOSPITAL_COMMUNITY): Payer: Medicare Other | Admitting: Physical Therapy

## 2016-03-17 LAB — COMPREHENSIVE METABOLIC PANEL
ALBUMIN: 3.5 g/dL (ref 3.5–5.0)
ALT: 26 U/L (ref 14–54)
AST: 27 U/L (ref 15–41)
Alkaline Phosphatase: 41 U/L (ref 38–126)
Anion gap: 6 (ref 5–15)
BUN: 19 mg/dL (ref 6–20)
CHLORIDE: 110 mmol/L (ref 101–111)
CO2: 22 mmol/L (ref 22–32)
Calcium: 9.2 mg/dL (ref 8.9–10.3)
Creatinine, Ser: 0.94 mg/dL (ref 0.44–1.00)
GFR calc Af Amer: >60 mL/min (ref >60)
GLUCOSE: 187 mg/dL — AB (ref 65–99)
POTASSIUM: 3.9 mmol/L (ref 3.5–5.1)
SODIUM: 138 mmol/L (ref 135–145)
Total Bilirubin: 0.4 mg/dL (ref 0.3–1.2)
Total Protein: 6.9 g/dL (ref 6.5–8.1)

## 2016-03-17 LAB — CBC WITH DIFFERENTIAL/PLATELET
BASOS ABS: 0.1 10*3/uL (ref 0.0–0.1)
BASOS PCT: 1 %
EOS ABS: 0.3 10*3/uL (ref 0.0–0.7)
EOS PCT: 4 %
HCT: 37.3 % (ref 36.0–46.0)
Hemoglobin: 11.9 g/dL — ABNORMAL LOW (ref 12.0–15.0)
Lymphocytes Relative: 32 %
Lymphs Abs: 2 10*3/uL (ref 0.7–4.0)
MCH: 28 pg (ref 26.0–34.0)
MCHC: 31.9 g/dL (ref 30.0–36.0)
MCV: 87.8 fL (ref 78.0–100.0)
MONO ABS: 0.6 10*3/uL (ref 0.1–1.0)
Monocytes Relative: 9 %
Neutro Abs: 3.4 10*3/uL (ref 1.7–7.7)
Neutrophils Relative %: 54 %
PLATELETS: 239 10*3/uL (ref 150–400)
RBC: 4.25 MIL/uL (ref 3.87–5.11)
RDW: 16.7 % — AB (ref 11.5–15.5)
WBC: 6.3 10*3/uL (ref 4.0–10.5)

## 2016-03-17 LAB — GLUCOSE, CAPILLARY
GLUCOSE-CAPILLARY: 304 mg/dL — AB (ref 65–99)
Glucose-Capillary: 186 mg/dL — ABNORMAL HIGH (ref 65–99)
Glucose-Capillary: 178 mg/dL — ABNORMAL HIGH (ref 65–99)
Glucose-Capillary: 217 mg/dL — ABNORMAL HIGH (ref 65–99)

## 2016-03-17 NOTE — Progress Notes (Signed)
Speech Language Pathology Daily Session Note  Patient Details  Name: Danielle Vargas MRN: [id number] Date of Birth: [birth year]  Today's Date: 03/17/2016 SLP Individual Time: 0800-0902 SLP Individual Time Calculation (min): 62 min  Short Term Goals: Week 1: SLP Short Term Goal 1 (Week 1): Pt will attend to her left upper extremity during basic functional tasks with supervision verbal cues.   SLP Short Term Goal 2 (Week 1): Pt will utilize external memory aids to facilitate recall of information with supervision verbal cues.   SLP Short Term Goal 3 (Week 1): Pt will complete semi-complex familiar tasks with supervision verbal cues for functional problem solving.   SLP Short Term Goal 4 (Week 1): Pt will return demonstration of at least 2 safety precautions with min assist verbal cues.   SLP Short Term Goal 5 (Week 1): Pt will selectively attend to semi-complex tasks for 15 minute intervals in mildly distracting environment with supervision verbal cues for redirection.    Skilled Therapeutic Interventions:  Pt was seen for skilled ST targeting cognitive goals.  Pt had been incontinent upon arrival but did not initiate a request to get cleaned up or use the bathroom until questioned.  Pt transferred to toilet with Stedy lift and +2 assist from nursing.  Pt required min assist verbal cues for initiation and sequencing of hand hygiene using hemi-technique.  Therapist facilitated the session with a money management task targeting functional problem solving for familiar tasks.  Pt required min assist verbal cues for organization and working memory to count money and make change.  Pt with good intellectual awareness of task difficulty, verbalizing that she was "a lot slower" in completing task when compared her to baseline; however, she had poor anticipatory awareness of how these deficits could impact her ability to return to managing her own finances in the home environment.  SLP recommended that pt have  assistance for medication and financial management upon discharge.  Pt verbalized understanding but therapist will continue to reinforce while inpatient.  Pt was handed off to PT in wheelchair at the end of today's therapy session.  Continue per current plan of care.    Function:  Eating Eating                 Cognition Comprehension Comprehension assist level: Follows basic conversation/direction with no assist  Expression   Expression assist level: Expresses basic needs/ideas: With no assist  Social Interaction Social Interaction assist level: Interacts appropriately 75 - 89% of the time - Needs redirection for appropriate language or to initiate interaction.  Problem Solving Problem solving assist level: Solves basic 75 - 89% of the time/requires cueing 10 - 24% of the time  Memory Memory assist level: Recognizes or recalls 75 - 89% of the time/requires cueing 10 - 24% of the time    Pain Pain Assessment Pain Assessment: No/denies pain  Therapy/Group: Individual Therapy  [name], Selinda Orion 03/17/2016, 11:41 AM

## 2016-03-17 NOTE — Progress Notes (Signed)
Physical Therapy Session Note  Patient Details  Name: Danielle Vargas MRN: [id number] Date of Birth: [birth year]  Today's Date: 03/17/2016 PT Individual Time: 0903-1000 PT Individual Time Calculation (min): 57 min   Short Term Goals: Week 1:  PT Short Term Goal 1 (Week 1): Patient will performed sit<>stand with mod A from PT PT Short Term Goal 2 (Week 1): Patient will initiate gait training.  PT Short Term Goal 3 (Week 1): Patient will perform bed mobility with mod A from PT.  PT Short Term Goal 4 (Week 1): Patient will perform chair<> bed transfer with mod A.  PT Short Term Goal 5 (Week 1): Patient will perform WC mobility for 115ft with min A.   Skilled Therapeutic Interventions/Progress Updates:    Pt received with handoff from SLP; denies pain and agreeable to treatment. Requests to return to room and brush teeth; requires setup A and pt perseverative on rinsing mouth requiring cues to initiate next task and refocus attention. Upper body dressing performed seated in w/c with modA to thread LUE and pull shirt over trunk. Lower body dressing +2 A with sit <>stand x3 reps in stedy with min A. Mod verbal cues in standing for activation and attention to LLE. Gait 1x10' and 1x12' with R rail and maxA for LLE progression and stance control, and w/c follow for safety. Back support placed in w/c and chair exchanged for 20x18 for increased depth to accommodate body habitus and L half lap tray. Returned to room and pt agreeable to remain seated in w/c until next session in 30 min; all needs in reach.   Therapy Documentation Precautions:  Precautions Precautions: Fall Restrictions Weight Bearing Restrictions: No Pain: Pain Assessment Pain Assessment: No/denies pain   See Function Navigator for Current Functional Status.   Therapy/Group: Individual Therapy  Luberta Mutter 03/17/2016, 10:01 AM

## 2016-03-17 NOTE — Progress Notes (Signed)
Social Work  Social Work Assessment and Plan  Patient Details  Name: Danielle Vargas MRN: [id number] Date of Birth: 1951-05-11  Today's Date: 03/17/2016  Problem List:  Patient Active Problem List   Diagnosis Date Noted  . DM type 2 with diabetic peripheral neuropathy ([place])   . Vascular headache   . Right middle cerebral artery stroke ([place]) 03/14/2016  . Asthma   . Acute lower UTI   . Benign essential HTN   . HLD (hyperlipidemia)   . S/P rotator cuff repair   . Tachypnea   . AKI (acute kidney injury) ([place])   . Acute right MCA stroke ([place])   . Back pain   . Ischemic stroke ([place]) 03/10/2016  . Stroke (cerebrum) ([place]) 03/10/2016  . Chest pain, rule out acute myocardial infarction 11/10/2015  . Chest pain 11/10/2015  . Obesity   . Hypothyroidism   . 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 UTI (urinary tract infection) 06/16/2015  . Diabetes mellitus ([place]) 06/16/2015  . RUQ abdominal pain 04/17/2015  . Hx of colonic polyps 04/17/2015  . Family hx of colon cancer 04/17/2015  . CPAP rhinitis 12/08/2014  . OSA on CPAP 12/08/2014  . 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 Retinopathy due to secondary diabetes mellitus, without macular edema, with moderate nonproliferative retinopathy ([place]) 09/18/2014  . Neuropathy due to secondary diabetes mellitus ([place]) 09/18/2014  . Diabetes mellitus due to underlying condition with other diabetic neurological complication ([place]) [id number]  . Obesity hypoventilation syndrome ([place]) 09/18/2014  . CAD in native artery 01/23/2014  . Morbid obesity ([place]) 01/23/2014  . Renal insufficiency 11/17/2013  . HTN (hypertension) 10/29/2013  . Chronic diastolic heart failure ([place]) 10/29/2013  . 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 LADA (latent autoimmune diabetes in adults), managed as type 1 ([place]) 08/08/2013   Past Medical History:  Past Medical History  Diagnosis Date  . DM (diabetes mellitus) ([place])   . Obesity   . Dyslipidemia   . HTN  (hypertension)   . Hypothyroidism   . Gastroparesis   . Migraine   . Osteoporosis   . Colon polyps 7/07    hyperplastic and adenomatous  . Nephrolithiasis   . Diverticulosis   . External hemorrhoid   . Fatty liver   . Asthma   . CAD (coronary artery disease)     a. 12/2008 Cath: mild irregs throughout, EF 75%.  . 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 Allergy   . Cataract     "just beginning"  . Myocardial infarction ([place])     FEb 2015  . Depression     pt unsure, psychologist said no  . Sleep apnea     wears CPAP  . Hyperlipidemia    Past Surgical History:  Past Surgical History  Procedure Laterality Date  . Tubal ligation  '85  . Breast reduction surgery  '88  . Cervical disc surgery  '98    fusion  . Carpal tunnel release  2000    right wrist  . Rotator cuff repair  '09    left shoulder  . Ulnar nerve repair  '02, '08    left done, then right  . Cesarean section  '79  '84  . Left heart catheterization with coronary angiogram N/A 10/26/2013    Procedure: LEFT HEART CATHETERIZATION WITH CORONARY ANGIOGRAM;  Surgeon: Sinclair Grooms, MD;  Location: [facility] CATH LAB;  Service: Cardiovascular;  Laterality: N/A;  . Colonoscopy    . Ep implantable device N/A 03/12/2016  Procedure: Loop Recorder Insertion;  Surgeon: Will Meredith Leeds, MD;  Location: [place] CV LAB;  Service: Cardiovascular;  Laterality: N/A;  . Tee without cardioversion N/A 03/12/2016    Procedure: TRANSESOPHAGEAL ECHOCARDIOGRAM (TEE);  Surgeon: Sanda Klein, MD;  Location: [facility] ENDOSCOPY;  Service: Cardiovascular;  Laterality: N/A;   Social History:  reports that she has never smoked. She has never used smokeless tobacco. She reports that she does not drink alcohol or use illicit drugs.  Family / Support Systems Marital Status: Divorced Patient Roles: Parent Children: Joellen [place] [phone]  [phone] Other Supports: Alex-son in [place] Anticipated Caregiver:  Daughter Ability/Limitations of Caregiver: daughter works fulltime and can not provide 24 hr care Caregiver Availability: Intermittent Family Dynamics: Close knit with her two children and two grandchildren. She realizes she can not go home unless she is independent and able to take care of herself. Her children work and have families, she would not want to burden them with her care.  Social History Preferred language: English Religion: Methodist Cultural Background: No issues Education: [employer] Read: Yes Write: Yes Employment Status: Disabled Freight forwarder Issues: No issues Guardian/Conservator: None-according to MD pt is capable of making her own decisions while here, will make sure daughter is involved also per pt's preference   Abuse/Neglect Physical Abuse: Denies Verbal Abuse: Denies Sexual Abuse: Denies Exploitation of patient/patient's resources: Denies Self-Neglect: Denies  Emotional Status Pt's affect, behavior adn adjustment status: Pt is motivated to improve and is hopeful she can make enough progress here she can go home from here, otherwise she will need to pursue alternatives. She has always been able to be independent even with her health issues, but this is different. She will give it all she has to do well here. Recent Psychosocial Issues: other health issues-back issues and CAD-this has disabled her from working Pyschiatric History: No history deferred depresion screen due to she seems to be coping appropriately and able to verbalize her concerns. Would benefit from neuro-psych seeing while here, will make referral. Substance Abuse History: No issues  Patient / Family Perceptions, Expectations & Goals Pt/Family understanding of illness & functional limitations: Pt and daughter can explain her stroke and deficits. Both have spoken with the MD and feel they have their questions addressed and just want to be kept updated on any medical information. Pt  does ask MD her questions and feels satisfied at this time. Premorbid pt/family roles/activities: Mother, grandmother, retiree, sister, church member, etc Anticipated changes in roles/activities/participation: resume Pt/family expectations/goals: Pt states: " I want to get back home if I can, but am trying to be realistic too."  Daughter states: " I am hopeful but understand she will need care at discharge."  [employer]: None Premorbid Home Care/DME Agencies: None Transportation available at discharge: Daughter and son in-law Resource referrals recommended: Neuropsychology, Support group (specify)  Discharge Planning Living Arrangements: Alone Support Systems: Children, Friends/neighbors Type of Residence: Private residence Insurance Resources: [employer], Multimedia programmer (specify) Web designer) Financial Resources: SSD Financial Screen Referred: No Living Expenses: Education officer, community Management: Patient Does the patient have any problems obtaining your medications?: No Home Management: Self Patient/Family Preliminary Plans: Plan depends upon how well pt does and what progress she makes here. Both are aware of the options-ie NHP and will work on this if necessary. Will see how pt progresses and work on a safe discharge plan for her. It is more than likely pt will require care at discharge from here. Social Work Anticipated Follow Up  Needs: HH/OP, SNF, Support Group  Clinical Impression Pleasant female who is willing to work hard and see how much progress she can make here and hopeful she can make enough progress to go home. Local daughter is supportive and willing to check in on her but works and has small children. Will work on a safe discharge for pt both are aware of the option of NH if needed which probably will be needed upon discharge from rehab, since pt does not have 24 hr care.  [name] 03/17/2016, 11:55 AM

## 2016-03-17 NOTE — Progress Notes (Signed)
Speech Language Pathology Daily Session Note  Patient Details  Name: Danielle Vargas MRN: [id number] Date of Birth: [birth year]  Today's Date: 03/17/2016 SLP Individual Time: [id number] SLP Individual Time Calculation (min): 30 min  15 missed minutes due to pain and fatigue   Short Term Goals: Week 1: SLP Short Term Goal 1 (Week 1): Pt will attend to her left upper extremity during basic functional tasks with supervision verbal cues.   SLP Short Term Goal 2 (Week 1): Pt will utilize external memory aids to facilitate recall of information with supervision verbal cues.   SLP Short Term Goal 3 (Week 1): Pt will complete semi-complex familiar tasks with supervision verbal cues for functional problem solving.   SLP Short Term Goal 4 (Week 1): Pt will return demonstration of at least 2 safety precautions with min assist verbal cues.   SLP Short Term Goal 5 (Week 1): Pt will selectively attend to semi-complex tasks for 15 minute intervals in mildly distracting environment with supervision verbal cues for redirection.    Skilled Therapeutic Interventions: Skilled treatment session focused on addressing cognition goals. SLP facilitated session by providing a novel, semi-complex task that patient selected attention to for 5 minute increments with Mod verbal cues.  Patient reported that her pain was a distracting factor as a result she was repositioned and RN was notified for pain medications.  Session ended 15 minutes early and when SLP attempted make up time later in the day patient asleep and was unable to arouse.  Continue with current plan of care.    Function:  Cognition Comprehension Comprehension assist level: Follows basic conversation/direction with no assist  Expression   Expression assist level: Expresses basic needs/ideas: With no assist  Social Interaction Social Interaction assist level: Interacts appropriately 75 - 89% of the time - Needs redirection for appropriate language or to  initiate interaction.  Problem Solving Problem solving assist level: Solves basic 75 - 89% of the time/requires cueing 10 - 24% of the time  Memory Memory assist level: Recognizes or recalls 75 - 89% of the time/requires cueing 10 - 24% of the time    Pain Pain Assessment Pain Assessment: 0-10 Pain Score: 8  Pain Type: Acute pain Pain Location: Rib cage Pain Orientation: Left Pain Descriptors / Indicators: Jabbing;Sharp Pain Onset: Gradual Pain Intervention(s): RN made aware;Repositioned;Rest Multiple Pain Sites: No  Therapy/Group: Individual Therapy  Carmelia Roller., Ruth [id number]  [place] 03/17/2016, 4:30 PM

## 2016-03-17 NOTE — IPOC Note (Signed)
Overall Plan of Care [facility]) Patient Details Name: Danielle Vargas MRN: [id number] DOB: May 18, 1951  Admitting Diagnosis: CVA  Hospital Problems: Active Problems:   Right middle cerebral artery stroke [facility])   DM type 2 with diabetic peripheral neuropathy [facility])   Vascular headache     Functional Problem List: Nursing Bladder, Bowel, Pain, Sensory, Skin Integrity  PT Balance, Endurance, Motor, Pain, Perception, Safety, Sensory  OT Balance, Cognition, Endurance, Motor, Pain, Perception, Safety, Sensory  SLP Cognition  TR         Basic ADL's: OT Grooming, Bathing, Dressing, Toileting     Advanced  ADL's: OT       Transfers: PT Bed Mobility, Bed to Chair, Car, Manufacturing systems engineer, Metallurgist: PT Ambulation, Emergency planning/management officer, Stairs     Additional Impairments: OT Fuctional Use of Upper Extremity  SLP Social Cognition   Memory, Attention, Awareness, Problem Solving  TR      Anticipated Outcomes Item Anticipated Outcome  Self Feeding set up   Swallowing      Basic self-care  mod A  Toileting  max A   Bathroom Transfers mod A  Bowel/Bladder  remain continent of bowel and bladder  Transfers  Supervision-Min A with LRAD  Locomotion  Supervision A with WC. Min A for household Ambulation with LRAD.   Communication     Cognition  Supervision   Pain  Pain at or below 2 out 10  Safety/Judgment  Remain free from falls at discharge   Therapy Plan: PT Intensity: Minimum of 1-2 x/day ,45 to 90 minutes PT Frequency: 5 out of 7 days PT Duration Estimated Length of Stay: 3-3.5 weeks OT Intensity: Minimum of 1-2 x/day, 45 to 90 minutes OT Frequency: 5 out of 7 days OT Duration/Estimated Length of Stay: 21-24 days SLP Intensity: Minumum of 1-2 x/day, 30 to 90 minutes SLP Frequency: 3 to 5 out of 7 days SLP Duration/Estimated Length of Stay: 21-24 days        Team Interventions: Nursing Interventions Bladder Management, Disease  Management/Prevention, Pain Management, Medication Management, Discharge Planning, Patient/Family Education  PT interventions Ambulation/gait training, Training and development officer, Cognitive remediation/compensation, Community reintegration, Discharge planning, Disease management/prevention, DME/adaptive equipment instruction, Functional electrical stimulation, Functional mobility training, Neuromuscular re-education, Pain management, Patient/family education, Psychosocial support, Skin care/wound management, Splinting/orthotics, Stair training, Therapeutic Activities, Therapeutic Exercise, UE/LE Strength taining/ROM, UE/LE Coordination activities, Visual/perceptual remediation/compensation, Wheelchair propulsion/positioning  OT Interventions Training and development officer, Cognitive remediation/compensation, Discharge planning, DME/adaptive equipment instruction, Functional mobility training, Neuromuscular re-education, Patient/family education, Self Care/advanced ADL retraining, Therapeutic Activities, Therapeutic Exercise, UE/LE Strength taining/ROM, UE/LE Coordination activities, Visual/perceptual remediation/compensation, Pain management  SLP Interventions Cognitive remediation/compensation, Cueing hierarchy, Functional tasks, Environmental controls, Internal/external aids  TR Interventions    SW/CM Interventions Discharge Planning, Psychosocial Support, Patient/Family Education    Team Discharge Planning: Destination: PT-Home ,[place] (SNF) , SLP-Home Projected Follow-up: PT-Home health PT, OT-  Skilled nursing facility, SLP-Home Health SLP, Outpatient SLP, 24 hour supervision/assistance Projected Equipment Needs: PT-Wheelchair (measurements), Wheelchair cushion (measurements), To be determined, OT- To be determined, SLP-None recommended by SLP Equipment Details: PT- , OT-  Patient/family involved in discharge planning: PT- Patient, Family member/caregiver,  OT-Patient, Family  member/caregiver, SLP-Patient, Family member/caregiver  MD ELOS: 18-22d Medical Rehab Prognosis:  Good Assessment: 65 y.o. right handed female with history of hypertension, obesity,Diabetes mellitus, hyperlipidemia and left rotator cuff repair. Per chart review patient lives alone and was independent prior to admission and active. One level apartment. She has a  daughter in the area that works. Presented 03/10/2016 with left-sided weakness after a fall from bed. She denied any loss of consciousness MRI of the brain showed extensive areas of both focal and confluent restricted diffusion throughout the right MCA distribution. CTA suspect right M1-MCA long segment stenosis or occlusion. 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 Patient did not receive TPA. Echocardiogram completed with limited study. The left ventricle appeared normal in overall size and function.On the afternoon of 03/11/2016 with increased increased right-sided weakness and slurred speech. Follow-up CT of the head showed no evidence of new infarct . MRI showed mild extension of acute right MCA infarct as compared to 03/10/2016. Neurology services recommended to continue Plavix. TEE 03/12/2016 showing no cardiac source of embolism. LVH LAD dilation present as well as placement of loop recorder. Neurology consulted currently on Plavix for CVA prophylaxis.     See Team Conference Notes for weekly updates to the plan of care  Now requiring 24/7 Rehab RN,MD, as well as CIR level PT, OT and SLP.  Treatment team will focus on ADLs and mobility with goals set at [facility]

## 2016-03-17 NOTE — Progress Notes (Signed)
Occupational Therapy Session Note  Patient Details  Name: Danielle Vargas MRN: [id number] Date of Birth: 10-Sep-1950  Today's Date: 03/17/2016 OT Individual Time: 1030-1130 OT Individual Time Calculation (min): 60 min    Short Term Goals: Week 1:  OT Short Term Goal 1 (Week 1): Pt will sit on EOB with S for 5 min to prepare for transfer. OT Short Term Goal 2 (Week 1): Pt will be able to squat pivot transfer to W/c with mod A +2 to prepare for toilet transfers.  OT Short Term Goal 3 (Week 1): Pt will tolerate standing in stedy lift for 2 minutes for clothing change. OT Short Term Goal 4 (Week 1): Pt will be independent with self ROM program for LUE.  Skilled Therapeutic Interventions/Progress Updates:    Pt reported feeling tired at start of session with c/o fatigue in RUE from transfers using STEDY with noted preference to not use STEDY again during this session.   Treatment session with focus on postural stability when seated. Clinician propelled pt to therapy gym in [facility]. Transferred to seated at [facility] with +2 using sliding board. Engaged pt in [place] reaching activity to challenge visual scanning, shoulder flexion in RUE, hip flexion, and return to midline seated posture for improved occupational performance. Pt required max verbal cues to initiate return to midline following reaching task completion. NMR used with LUE during task to facilitate sensory and motor initiation/return. Provided pt education regarding goals and aims of occupational therapy and to clarify questions, if any, regarding activity. Pt verbalized understanding and enjoyment of activity. Pt returned to room in [facility] propelled by clinician. Sliding board transfer +2 from [facility] to bed. Max verbal cues and mod tactile cues for bed mobility and positioning due to excessive fatigue. Pt left in bed with all needs in reach. Continue to address postural stability.   Therapy Documentation Precautions:  Precautions Precautions:  Fall Restrictions Weight Bearing Restrictions: No General:   Vital Signs: Oxygen Therapy SpO2: 95 % O2 Device: Not Delivered Pain: Pain Assessment Pain Assessment: No/denies pain ADL: ADL ADL Comments: refer to functional navigator  See Function Navigator for Current Functional Status.   Therapy/Group: Individual Therapy  Dierdre Searles 03/17/2016, 12:09 PM

## 2016-03-17 NOTE — Progress Notes (Signed)
[facility] PHYSICAL MEDICINE & REHABILITATION     PROGRESS NOTE  Subjective/Complaints:  No new issues overnight. Tolerating physical therapy today.  ROS: Denies CP, SOB, N/V/D.  Objective: Vital Signs: Blood pressure 162/60, pulse 88, temperature 98 F (36.7 C), temperature source Oral, resp. rate 18, height 5\' 1"  (1.549 m), SpO2 95 %. No results found.  Recent Labs  03/17/16 0525  WBC 6.3  HGB 11.9*  HCT 37.3  PLT 239    Recent Labs  03/17/16 0525  NA 138  K 3.9  CL 110  GLUCOSE 187*  BUN 19  CREATININE 0.94  CALCIUM 9.2   CBG (last 3)   Recent Labs  03/16/16 2105 03/17/16 0649 03/17/16 1217  GLUCAP 260* 186* 178*    Wt Readings from Last 3 Encounters:  03/13/16 96.9 kg (213 lb 10 oz)  03/04/16 100.245 kg (221 lb)  11/23/15 101.696 kg (224 lb 3.2 oz)    Physical Exam:  BP 162/60 mmHg  Pulse 88  Temp(Src) 98 F (36.7 C) (Oral)  Resp 18  Ht 5\' 1"  (1.549 m)  SpO2 95% Constitutional: She appears well-developed and well-nourished.  HENT: Normocephalic and atraumatic.  Cardiovascular: Normal rate and regular rhythm.  Respiratory: Effort normal and breath sounds normal. No respiratory distress.  GI: Soft. Bowel sounds are normal. She exhibits no distension.  Musculoskeletal: She exhibits no edema or tenderness.  Neurological: She is alert and oriented.  Follow commands.  Left facial weakness. Motor: RUE: 5/5 proximal to distal RLE: Hip flexion 4+/5, knee extension 4/5, ankle dorsi/plantar flexion 5/5 LUE: 0/5  LLE: able to wiggle toes, otherwise 0/5 Skin: Skin is warm and dry.  Psychiatric:  Mood, affect, behavior WNL.  Assessment/Plan: 1. 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 Physiatrist is providing close team supervision and 24 hour management of active medical problems  listed below. Physiatrist and rehab team continue to assess barriers to discharge/monitor patient progress toward functional and medical goals.  Function:  Bathing Bathing position   Position: Wheelchair/chair at sink  Bathing parts Body parts bathed by patient: Left arm, Chest, Abdomen, Right upper leg, Left upper leg Body parts bathed by helper: Right arm, Front perineal area, Buttocks, Right lower leg, Left lower leg, Back  Bathing assist        Upper Body Dressing/Undressing Upper body dressing   What is the patient wearing?: Pull over shirt/dress     Pull over shirt/dress - Perfomed by patient: Thread/unthread right sleeve, Put head through opening Pull over shirt/dress - Perfomed by helper: Pull shirt over trunk, Thread/unthread left sleeve        Upper body assist Assist Level: Touching or steadying assistance(Pt > 75%)      Lower Body Dressing/Undressing Lower body dressing   What is the patient wearing?: Pants       Pants- Performed by helper: Thread/unthread right pants leg, Thread/unthread left pants leg, Pull pants up/down   Non-skid slipper socks- Performed by helper: Don/doff right sock, Don/doff left sock                  Lower body assist Assist for lower body dressing: 2 Helpers      Toileting Toileting          Toileting assist     Transfers Chair/bed transfer   Chair/bed transfer method: Stand pivot Chair/bed transfer assist level: Maximal  assist (Pt 25 - 49%/lift and lower) Chair/bed transfer assistive device: Armrests Mechanical lift: Stedy   Locomotion Ambulation Ambulation activity did not occur: Safety/medical concerns   Max distance: 12 Assist level: 2 helpers   Wheelchair   Type: Manual Max wheelchair distance: 113ft Assist Level: Maximal assistance (Pt 25 - 49%)  Cognition Comprehension Comprehension assist level: Follows basic conversation/direction with no assist  Expression Expression assist level: Expresses basic  needs/ideas: With no assist  Social Interaction Social Interaction assist level: Interacts appropriately 75 - 89% of the time - Needs redirection for appropriate language or to initiate interaction.  Problem Solving Problem solving assist level: Solves basic 75 - 89% of the time/requires cueing 10 - 24% of the time  Memory Memory assist level: Recognizes or recalls 75 - 89% of the time/requires cueing 10 - 24% of the time    Medical Problem List and Plan: 1. 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 Status post loop recorder  Cont CIR, PT, OT, speech therapy  Splints ordered, pt has not received  Fluoxetine started on 7/7, but pt refused, now, willing to take 2. DVT Prophylaxis/Anticoagulation: SCDs. Monitor for any signs of DVT. Venous Doppler study negative 3. Pain Management: Neurontin 300 mg 3 times a day, Topamax 50 mg twice a day, hydrocodone as needed 4. Diabetes mellitus of peripheral neuropathy. Hemoglobin A1c 7.3.   Lantus insulin 45 units twice a day  NovoLog 15 units 3 times a day, increased to 20U on 7/9.  Check blood sugars before meals and at bedtime. Diabetic teaching   CBG (last 3)   Recent Labs  03/16/16 2105 03/17/16 0649 03/17/16 1217  GLUCAP 260* 186* 178*    5. Neuropsych: This patient is capable of making decisions on her own behalf. 6. Skin/Wound Care: Routine skin checks 7. Fluids/Electrolytes/Nutrition: Routine I&O 8. Hypertension. Coreg 6.25 mg twice a day  Will continue to allow permissive HTN for time being Filed Vitals:   03/17/16 0530 03/17/16 0800  BP: 176/77 162/60  Pulse: 86 88  Temp: 98 F (36.7 C)   Resp: 18    9. Hypothyroidism. Synthroid 10. Hyperlipidemia. Crestor, fenofibrate 11. Asthma. Singulair 10 mg daily, Dulera 2 puffs twice daily, albuterol nebulizer as needed 12. Morbid Obesity. Body mass index is 40.39 kg/(m^2)., Diet and exercise education, cont to  encourage weight loss to increase endurance and promote overall health. Dietary follow-up  13. Morganella UTI. 7 day course of Vantin 200 mg every 12 hours times initiated 03/13/2016 14. Obesity hypoventilation syndrome: CPAP  LOS (Days) 3 A FACE TO FACE EVALUATION WAS PERFORMED  Colbert Curenton E 03/17/2016 1:00 PM

## 2016-03-17 NOTE — Progress Notes (Signed)
Orthopedic Tech Progress Note Patient Details:  Danielle Vargas [birth year] [id number]  Patient ID: Danielle Vargas, female   DOB: Jan 17, 1951, 65 y.o.   MRN: [id number]   Danielle Vargas 03/17/2016, 12:19 PMCalled [employer] for left Prafo boot, left Resting hand splint and left wrist splint.

## 2016-03-17 NOTE — Care Management Note (Signed)
[facility] Individual Statement of Services  Patient Name:  Danielle Vargas  Date:  03/17/2016  Welcome to the [facility].  Our goal is to provide you with an individualized program based on your diagnosis and situation, designed to meet your specific needs.  With this comprehensive rehabilitation program, you will be expected to participate in at least 3 hours of rehabilitation therapies Monday-Friday, with modified therapy programming on the weekends.  Your rehabilitation program will include the following services:  Physical Therapy (PT), Occupational Therapy (OT), Speech Therapy (ST), 24 hour per day rehabilitation nursing, Therapeutic Recreaction (TR), Case Management (Social Worker), Rehabilitation Medicine, Nutrition Services and Pharmacy Services  Weekly team conferences will be held on Wednesday to discuss your progress.  Your Social Worker will talk with you frequently to get your input and to update you on team discussions.  Team conferences with you and your family in attendance may also be held.  Expected length of stay: 3 weeks  Overall anticipated outcome: supervision/min assist level  Depending on your progress and recovery, your program may change. Your Social Worker will coordinate services and will keep you informed of any changes. Your Social Worker's name and contact numbers are listed  below.  The following services may also be recommended but are not provided by the [facility] will be made to provide these services after discharge if needed.  Arrangements include referral to agencies that provide these services.  Your insurance has been verified to be:  Medicare & medicaid Your primary doctor is:  Marton [facility]  Pertinent information will be shared with your doctor and your insurance  company.  Social Worker:  Ovidio Kin, [place] or ([phone]  Information discussed with and copy given to patient by: [name], 03/17/2016, 9:47 AM

## 2016-03-18 ENCOUNTER — Inpatient Hospital Stay (HOSPITAL_COMMUNITY): Payer: Medicare Other | Admitting: Occupational Therapy

## 2016-03-18 ENCOUNTER — Inpatient Hospital Stay (HOSPITAL_COMMUNITY): Payer: Medicare Other | Admitting: *Deleted

## 2016-03-18 ENCOUNTER — Inpatient Hospital Stay (HOSPITAL_COMMUNITY): Payer: Medicare Other | Admitting: Speech Pathology

## 2016-03-18 LAB — GLUCOSE, CAPILLARY
GLUCOSE-CAPILLARY: 167 mg/dL — AB (ref 65–99)
Glucose-Capillary: 184 mg/dL — ABNORMAL HIGH (ref 65–99)
Glucose-Capillary: 228 mg/dL — ABNORMAL HIGH (ref 65–99)
Glucose-Capillary: 275 mg/dL — ABNORMAL HIGH (ref 65–99)

## 2016-03-18 MED ORDER — INSULIN GLARGINE 100 UNIT/ML ~~LOC~~ SOLN
47.0000 [IU] | Freq: Two times a day (BID) | SUBCUTANEOUS | Status: DC
  Administered 2016-03-18 – 2016-03-24 (×13): 47 [IU] via SUBCUTANEOUS
  Filled 2016-03-18 (×14): qty 0.47

## 2016-03-18 NOTE — Progress Notes (Signed)
Speech Language Pathology Daily Session Note  Patient Details  Name: Danielle Vargas MRN: [id number] Date of Birth: 1951-01-27  Today's Date: 03/18/2016 SLP Individual Time: 0801-0900 SLP Individual Time Calculation (min): 59 min  Short Term Goals: Week 1: SLP Short Term Goal 1 (Week 1): Pt will attend to her left upper extremity during basic functional tasks with supervision verbal cues.   SLP Short Term Goal 2 (Week 1): Pt will utilize external memory aids to facilitate recall of information with supervision verbal cues.   SLP Short Term Goal 3 (Week 1): Pt will complete semi-complex familiar tasks with supervision verbal cues for functional problem solving.   SLP Short Term Goal 4 (Week 1): Pt will return demonstration of at least 2 safety precautions with min assist verbal cues.   SLP Short Term Goal 5 (Week 1): Pt will selectively attend to semi-complex tasks for 15 minute intervals in mildly distracting environment with supervision verbal cues for redirection.    Skilled Therapeutic Interventions: Pt was seen for skilled ST targeting cognitive goals.  Therapist facilitated the session with medication management subtests of ALFA standardized cognitive test to target goals for functional problem solving.  Pt required min assist verbal cues for 100% accuracy to organize pills into a medication chart due to decreased attention to details.  Pt recalled function of her currently scheduled medications when named with min assist verbal cues for 80% accuracy.  Pt was able to complete functional math calculations from word problems for 100% accuracy with mod I.  Min assist verbal cues were needed to attend to left upper extremity throughout session.  Pt also required min assist verbal cues for redirection to task and topic maintenance.  Pt was returned to room and left with call bell within reach.  Continue per current plan of care.    Function:  Eating Eating                  Cognition Comprehension Comprehension assist level: Understands complex 90% of the time/cues 10% of the time  Expression   Expression assist level: Expresses basic needs/ideas: With no assist  Social Interaction Social Interaction assist level: Interacts appropriately 75 - 89% of the time - Needs redirection for appropriate language or to initiate interaction.  Problem Solving Problem solving assist level: Solves basic 75 - 89% of the time/requires cueing 10 - 24% of the time  Memory Memory assist level: Recognizes or recalls 75 - 89% of the time/requires cueing 10 - 24% of the time    Pain Pain Assessment Pain Assessment: 0-10 Pain Score: 8  Pain Type: Acute pain Pain Location: Hip Pain Orientation: Left Pain Descriptors / Indicators: Aching Pain Intervention(s): RN made aware  Therapy/Group: Individual Therapy  Jerilee Space, Selinda Orion 03/18/2016, 12:17 PM

## 2016-03-18 NOTE — Progress Notes (Signed)
Occupational Therapy Session Note  Patient Details  Name: Danielle Vargas MRN: [id number] Date of Birth: April 30, 1951  Today's Date: 03/18/2016 OT Individual Time: 1100-1200 OT Individual Time Calculation (min): 60 min    Short Term Goals: Week 1:  OT Short Term Goal 1 (Week 1): Pt will sit on EOB with S for 5 min to prepare for transfer. OT Short Term Goal 2 (Week 1): Pt will be able to squat pivot transfer to W/c with mod A +2 to prepare for toilet transfers.  OT Short Term Goal 3 (Week 1): Pt will tolerate standing in stedy lift for 2 minutes for clothing change. OT Short Term Goal 4 (Week 1): Pt will be independent with self ROM program for LUE.  Skilled Therapeutic Interventions/Progress Updates:    Treatment session with focus on self care skills of dressing, bed mobility, and LB bathing at bed level. WC to bed transfer using STEDY with pt able to pull self up to standing with Min A for pt inattention to LUE. Verbal cues required for pt to sit on STEDY rather than hold self up in standing during transfer. Min A for seated postural support and Max verbal cues for sequencing UB dressing. Max verbal cues and Max A for bed mobility to change incontinence brief, for LB bathing, and for LB dressing. Frequent rest breaks taken during bed mobility due to generalized weakness. Continue to address UB strength, postural stability, and bed mobility for improved occupational performance overall.    Therapy Documentation Precautions:  Precautions Precautions: Fall Restrictions Weight Bearing Restrictions: No General:   Vital Signs: Therapy Vitals Pulse Rate: 72 BP: (!) 192/72 mmHg Pain: Pain Assessment Pain Assessment: 0-10 Pain Score: 4  Pain Type: Acute pain Pain Location: Hip Pain Orientation: Left Pain Descriptors / Indicators: Aching Pain Intervention(s): RN made aware ADL: ADL ADL Comments: refer to functional navigator  See Function Navigator for Current Functional  Status.   Therapy/Group: Individual Therapy  Dierdre Searles 03/18/2016, 12:30 PM

## 2016-03-18 NOTE — Progress Notes (Signed)
Physical Therapy Session Note  Patient Details  Name: Danielle Vargas MRN: [id number] Date of Birth: 09/16/1950  Today's Date: 03/18/2016 PT Individual Time: 1300-1415 PT Individual Time Calculation (min): 75 min   Short Term Goals: Week 1:  PT Short Term Goal 1 (Week 1): Patient will performed sit<>stand with mod A from PT PT Short Term Goal 2 (Week 1): Patient will initiate gait training.  PT Short Term Goal 3 (Week 1): Patient will perform bed mobility with mod A from PT.  PT Short Term Goal 4 (Week 1): Patient will perform chair<> bed transfer with mod A.  PT Short Term Goal 5 (Week 1): Patient will perform WC mobility for 160ft with min A.   Skilled Therapeutic Interventions/Progress Updates:  Tx focused on functional mobility training and NMR via forced use, manual facilitation, and multi-modal cues for motor control and sitting and standing balance. Pt received in bed, reporting 9/10 rib pain, but no visible/audible indications of that rating.   Supine NMR including R/L rolling with multi-modal cues x3 each direction with max safety cues for LUE management.  Bridging x10 - increased L hip/SI pain L clamshells L manually resisted mass hip/knee flexion/ext Seated LAQ 2x10 bil  Supine>sit via logrolling with Max A for bil LEs and trunk lifting with multi-modal cues for sequence. Pt needed Max A for sitting balance due to posterior and L lean in sitting.   Stedy used for transfer x3 with Mod A for lowering and L-UE management.   Sitting balance edge of mat x20 min for trunk control, postural control, trunk shortening/lenghtening and balance reactions. Therapist sat in front of pt with 4" stool for foot support and a mirror for visual feedback. Pt initially needing work for anterior translation over bOS due to posterior lean. Pt performed reaching in/outside of BOS and rotational movements, reciprocal scooting, and lateral pelvic tilts. Pt had most difficulty with reaching in rotations  and pelvic tilts, but improved with practice and carried over into mobility in [facility] seat.   Pt declined gait at hall rail due to fatigue. Pt agreeable to standing at L hall rail 2x80min with mirror for visual feedback and cues for alignment.  WC propulsion x50' with Max A and hemi-technique without cushion.  Handoff to NT.   Therapy Documentation Precautions:  Precautions Precautions: Fall Restrictions Weight Bearing Restrictions: No General:   Vital Signs: Therapy Vitals Pulse Rate: 72 BP: (!) 192/72 mmHg Pain: Pain Assessment Pain Score: 4    See Function Navigator for Current Functional Status.   Therapy/Group: Individual Therapy  Soundra Pilon 03/18/2016, 1:13 PM

## 2016-03-19 ENCOUNTER — Inpatient Hospital Stay (HOSPITAL_COMMUNITY): Payer: Medicare Other

## 2016-03-19 ENCOUNTER — Inpatient Hospital Stay (HOSPITAL_COMMUNITY): Payer: Medicare Other | Admitting: Speech Pathology

## 2016-03-19 ENCOUNTER — Inpatient Hospital Stay (HOSPITAL_COMMUNITY): Payer: Medicare Other | Admitting: Physical Therapy

## 2016-03-19 ENCOUNTER — Inpatient Hospital Stay (HOSPITAL_COMMUNITY): Payer: Medicare Other | Admitting: Occupational Therapy

## 2016-03-19 LAB — GLUCOSE, CAPILLARY
GLUCOSE-CAPILLARY: 194 mg/dL — AB (ref 65–99)
GLUCOSE-CAPILLARY: 280 mg/dL — AB (ref 65–99)
Glucose-Capillary: 132 mg/dL — ABNORMAL HIGH (ref 65–99)
Glucose-Capillary: 154 mg/dL — ABNORMAL HIGH (ref 65–99)

## 2016-03-19 NOTE — Progress Notes (Signed)
Occupational Therapy Session Note  Patient Details  Name: Danielle Vargas MRN: [id number] Date of Birth: [birth year]  Today's Date: 03/19/2016 OT Individual Time: 0930-1100 OT Individual Time Calculation (min): 90 min    Short Term Goals: Week 1:  OT Short Term Goal 1 (Week 1): Pt will sit on EOB with S for 5 min to prepare for transfer. OT Short Term Goal 2 (Week 1): Pt will be able to squat pivot transfer to W/c with mod A +2 to prepare for toilet transfers.  OT Short Term Goal 3 (Week 1): Pt will tolerate standing in stedy lift for 2 minutes for clothing change. OT Short Term Goal 4 (Week 1): Pt will be independent with self ROM program for LUE.  Skilled Therapeutic Interventions/Progress Updates:    Treatment session with focus on improved seated postural stability for improved performance and engagement in valued occupations. Engaged pt in activity which incorporated R lateral stretches in D1 and D2 flexion/extension patterns to counter effects of observed R lateral flexion. Pt expressed fear of falling when leaning to retrieve cones/cups from floor and agreed to continue with activity following reassurance from clinician. Graded activity to counter pt fear of falling which appeared to have positive effects on willingness to engage.   Pt completed lateral scoots at [facility] with Min to Mod A to include repositioning LLE as needed and tactile cues for weight shifting. Pt verbalized awareness of RLE when adjustments warranted. Attempted gravity eliminated positioning at L sidelying to further facilitate extension of R side of torso. Discontinued attempts due to pt c/o pain in rib cage secondary to fall from stroke on 03/08/16.   Sliding board transfer bed <> WC with Max A then Mod A when returned to bed at end of session. Squat pivot transfers at Mod to Max A with multimodal cues for weight shifting.   Therapy Documentation Precautions:  Precautions Precautions: Fall Restrictions Weight Bearing  Restrictions: No General: General PT Missed Treatment Reason: Patient unwilling to participate (eating breakfast, late tray) Vital Signs: Oxygen Therapy SpO2: 97 % O2 Device: Not Delivered Pain: Pt c/o pain in L ribs secondary to fall dated 03/08/16. Pain medications administered prior to session.   ADL: ADL ADL Comments: refer to functional navigator  See Function Navigator for Current Functional Status.   Therapy/Group: Individual Therapy  Dierdre Searles 03/19/2016, 12:29 PM

## 2016-03-19 NOTE — Progress Notes (Signed)
Social Work [name], LCSW Social Worker Signed  Patient Care Conference 03/19/2016  2:20 PM    Expand All Collapse All   Inpatient RehabilitationTeam Conference and Plan of Care Update Date: 03/19/2016   Time: 11:20 Am     Patient Name: Danielle Vargas       Medical Record Number: [id number]  Date of Birth: 1951/01/14 Sex: Female         Room/Bed: 4W11C/4W11C-01 Payor Info: Payor: MEDICARE / Plan: MEDICARE PART A AND B / Product Type: *No Product type* /    Admitting Diagnosis: CVA  Admit Date/Time:  03/14/2016  6:38 PM Admission Comments: No comment available   Primary Diagnosis:  <principal problem not specified> Principal Problem: <principal problem not specified>    Patient Active Problem List     Diagnosis  Date Noted   .  DM type 2 with diabetic peripheral neuropathy ([place])     .  Vascular headache     .  Right middle cerebral artery stroke ([place])  03/14/2016   .  Asthma     .  Acute lower UTI     .  Benign essential HTN     .  HLD (hyperlipidemia)     .  S/P rotator cuff repair     .  Tachypnea     .  AKI (acute kidney injury) ([place])     .  Acute right MCA stroke ([place])     .  Back pain     .  Ischemic stroke ([place])  03/10/2016   .  Stroke (cerebrum) ([place])  03/10/2016   .  Chest pain, rule out acute myocardial infarction  11/10/2015   .  Chest pain  11/10/2015   .  Obesity     .  Hypothyroidism     .  Recurrent nephrolithiasis  07/17/2015   .  UTI (urinary tract infection)  06/16/2015   .  Diabetes mellitus ([place])  06/16/2015   .  RUQ abdominal pain  04/17/2015   .  Hx of colonic polyps  04/17/2015   .  Family hx of colon cancer  04/17/2015   .  CPAP rhinitis  12/08/2014   .  OSA on CPAP  12/08/2014   .  Hypersomnia with sleep apnea  09/18/2014   .  Retinopathy due to secondary diabetes mellitus, without macular edema, with moderate nonproliferative retinopathy ([place])  09/18/2014   .  Neuropathy due to secondary diabetes mellitus ([place])  09/18/2014   .  Diabetes  mellitus due to underlying condition with other diabetic neurological complication ([place])  99991111   .  Obesity hypoventilation syndrome ([place])  09/18/2014   .  CAD in native artery  01/23/2014   .  Morbid obesity ([place])  01/23/2014   .  Renal insufficiency  11/17/2013   .  HTN (hypertension)  10/29/2013   .  Chronic diastolic heart failure ([place])  10/29/2013   .  Old myocardial infarction  10/26/2013   .  LADA (latent autoimmune diabetes in adults), managed as type 1 ([place])  08/08/2013     Expected Discharge Date: Expected Discharge Date: 04/01/16  Team Members Present: Physician leading conference: Dr. Alysia Penna Social Worker Present: Ovidio Kin, LCSW Nurse Present: Heather Roberts, RN PT Present: Kem Parkinson, PT OT Present: Simonne Come, OT SLP Present: Windell Moulding, SLP PPS Coordinator present : Daiva Nakayama, RN, CRRN        Current Status/Progress  Goal  Weekly Team Focus  Medical     has decreased processing speed, occ pain c/os   maintain medical stability during rehab stay transition back to PCP  increase endurance   Bowel/Bladder     Contient of bowel and bladder periods of incontinent during the night  Continent of bowel and bladder   timed toileting at night   Swallow/Nutrition/ Hydration               ADL's     Max A for LB dressing and bathing; Mod A overall UB dressing;   Mod A bathing, LB dressing, and toilet transfers; Min A UB dressing   improved activity tolerance, transfers, trunk control, ADL retraining   Mobility     modA bed mobility, maxA transfers, +2 gait   S bed mobility, S transfers, minA gait    participation, activity tolerance, gait training, dynamic sitting/standing balance   Communication               Safety/Cognition/ Behavioral Observations              Pain     Pain control with prn pain meds  Pain level at or below 3 with min assist   Monitor pain q shift and prn and treat as needed    Skin     Bottom is red but blanchable  with allevyn dressing in place   Remain free from skin breakdown/infection with min assist   Monitor skin q shift and prn       *See Care Plan and progress notes for long and short-term goals.    Barriers to Discharge:  still requires physical assist,      Possible Resolutions to Barriers:   cont rehab     Discharge Planning/Teaching Needs:   Would like to go home but does not have 24 hr care, so plan is probably NHP from here       Team Discussion:    Currently max assist level, goals for mod assist. Making slow progress, fatigues easily. Attention is poor and very distractible which limits progress. Pain from fall at home prior to admission with ribs. At times has difficulty with buy in and participating in therapies. Admission is to lessen the burden of care and to get to a higher level before transfer to NH. Will begin process with daughter   Revisions to Treatment Plan:    NHP    Continued Need for Acute Rehabilitation Level of Care: The patient requires daily medical management by a physician with specialized training in physical medicine and rehabilitation for the following conditions: Daily direction of a multidisciplinary physical rehabilitation program to ensure safe treatment while eliciting the highest outcome that is of practical value to the patient.: Yes Daily medical management of patient stability for increased activity during participation in an intensive rehabilitation regime.: Yes Daily analysis of laboratory values and/or radiology reports with any subsequent need for medication adjustment of medical intervention for : Neurological problems  [name] 03/19/2016, 3:44 PM                  Patient ID: Danielle Vargas, female   DOB: September 17, 1950, 65 y.o.   MRN: [id number]

## 2016-03-19 NOTE — Progress Notes (Signed)
Physical Therapy Session Note  Patient Details  Name: Danielle Vargas MRN: [id number] Date of Birth: Jun 20, 1951  Today's Date: 03/19/2016 PT Individual Time: [id number] PT Individual Time Calculation (min): 45 min   Short Term Goals: Week 1:  PT Short Term Goal 1 (Week 1): Patient will performed sit<>stand with mod A from PT PT Short Term Goal 2 (Week 1): Patient will initiate gait training.  PT Short Term Goal 3 (Week 1): Patient will perform bed mobility with mod A from PT.  PT Short Term Goal 4 (Week 1): Patient will perform chair<> bed transfer with mod A.  PT Short Term Goal 5 (Week 1): Patient will perform WC mobility for 14ft with min A.   Skilled Therapeutic Interventions/Progress Updates:    Pt received seated in bed eating breakfast; initially declining therapy as it is "too early" and "don't want to be interrupted during my breakfast". Pt perseverative throughout session on being upset that she had an early therapy session; informed pt that scheduling had been notified of request to not have therapy before 8am and eventually able to redirect pt to task. Rolling R/L with bedrails and minA for therapist to change brief and perform hygiene totalA. Supine>sit from flat bed with maxA for cervical flexion and RUE reaching L anterolaterally to A with transfer. Sit <>stand to don pants maxA. Stand pivot transfer maxA to w/c with several attempts due to pt fearfulness, cues for technique and hand placement.Transfer w/c <>mat table with squat pivot modA to R and maxA to L to A with hip clearance and anterior weight shift. Sit <>stand x3 reps from edge of mat table modA with facilitation for LLE weight bearing and activation. Sitting balance with R lateral reaching, wedge under L hip to A with neutral pelvic and spinal alignment. R reaching to facilitate L lateral trunk flexors. Sit <>stand x2 reps while cued to place horseshoe on bar to facilitate R lateral weight shift; able to initiate weight  shift without assist. Returned to w/c as above; remained seated in w/c with all needs in reach at completion of session.   Therapy Documentation Precautions:  Precautions Precautions: Fall Restrictions Weight Bearing Restrictions: No General: PT Amount of Missed Time (min): 15 Minutes PT Missed Treatment Reason: Patient unwilling to participate (eating breakfast, late tray) Pain: Pain Assessment Pain Assessment: 0-10 Pain Score: 9  Pain Type: Acute pain Pain Location: Hip Pain Orientation: Left Pain Descriptors / Indicators: Aching;Sore Pain Frequency: Intermittent Pain Onset: On-going Patients Stated Pain Goal: 3 Pain Intervention(s): Ambulation/increased activity;Repositioned;RN made aware Multiple Pain Sites: No   See Function Navigator for Current Functional Status.   Therapy/Group: Individual Therapy  Luberta Mutter 03/19/2016, 9:01 AM

## 2016-03-19 NOTE — Discharge Instructions (Signed)
Inpatient Rehab Discharge Instructions  Jentrie Misek Discharge date and time: No discharge date for patient encounter.   Activities/Precautions/ Functional Status: Activity: activity as tolerated Diet: diabetic diet Wound Care: none needed Functional status:  ___ No restrictions     ___ Walk up steps independently ___ 24/7 supervision/assistance   ___ Walk up steps with assistance ___ Intermittent supervision/assistance  ___ Bathe/dress independently ___ Walk with walker     __x STROKE/TIA DISCHARGE INSTRUCTIONS SMOKING Cigarette smoking nearly doubles your risk of having a stroke & is the single most alterable risk factor  If you smoke or have smoked in the last 12 months, you are advised to quit smoking for your health.  Most of the excess cardiovascular risk related to smoking disappears within a year of stopping.  Ask you doctor about anti-smoking medications  [place] Quit Line: 1-800-QUIT NOW  Free Smoking Cessation Classes [phone]  CHOLESTEROL Know your levels; limit fat & cholesterol in your diet  Lipid Panel     Component Value Date/Time   CHOL 138 03/11/2016 0507   TRIG 232* 03/11/2016 0507   HDL 29* 03/11/2016 0507   CHOLHDL 4.8 03/11/2016 0507   VLDL 46* 03/11/2016 0507   LDLCALC 63 03/11/2016 0507      Many patients benefit from treatment even if their cholesterol is at goal.  Goal: Total Cholesterol (CHOL) less than 160  Goal:  Triglycerides (TRIG) less than 150  Goal:  HDL greater than 40  Goal:  LDL (LDLCALC) less than 100   BLOOD PRESSURE American Stroke Association blood pressure target is less that 120/80 mm/Hg  Your discharge blood pressure is:  BP: (!) 162/63 mmHg  Monitor your blood pressure  Limit your salt and alcohol intake  Many individuals will require more than one medication for high blood pressure  DIABETES (A1c is a blood sugar average for last 3 months) Goal HGBA1c is under 7% (HBGA1c is blood sugar average for last 3 months)    Diabetes:    Lab Results  Component Value Date   HGBA1C 7.3* 03/11/2016     Your HGBA1c can be lowered with medications, healthy diet, and exercise.  Check your blood sugar as directed by your physician  Call your physician if you experience unexplained or low blood sugars.  PHYSICAL ACTIVITY/REHABILITATION Goal is 30 minutes at least 4 days per week  Activity: Increase activity slowly, Therapies: Physical Therapy: Home Health Return to work:   Activity decreases your risk of heart attack and stroke and makes your heart stronger.  It helps control your weight and blood pressure; helps you relax and can improve your mood.  Participate in a regular exercise program.  Talk with your doctor about the best form of exercise for you (dancing, walking, swimming, cycling).  DIET/WEIGHT Goal is to maintain a healthy weight  Your discharge diet is: Diet heart healthy/carb modified Room service appropriate?: Yes; Fluid consistency:: Thin  liquids Your height is:  Height: 5\' 1"  (154.9 cm) Your current weight is:   Your Body Mass Index (BMI) is:     Following the type of diet specifically designed for you will help prevent another stroke.  Your goal weight range is:    Your goal Body Mass Index (BMI) is 19-24.  Healthy food habits can help reduce 3 risk factors for stroke:  High cholesterol, hypertension, and excess weight.  RESOURCES Stroke/Support Group:  Call [phone]   STROKE EDUCATION PROVIDED/REVIEWED AND GIVEN TO PATIENT Stroke warning signs and symptoms How to  activate emergency medical system (call 911). Medications prescribed at discharge. Need for follow-up after discharge. Personal risk factors for stroke. Pneumonia vaccine given:  Flu vaccine given:  My questions have been answered, the writing is legible, and I understand these instructions.  I will adhere to these goals & educational materials that have been provided to me after my discharge from the hospital.     _ Bathe/dress with assistance ___ Walk Independently    ___ Shower independently ___ Walk with assistance    ___ Shower with assistance ___ No alcohol     ___ Return to work/school ________  Special Instructions:    My questions have been answered and I understand these instructions. I will adhere to these goals and the provided educational materials after my discharge from the hospital.  Patient/Caregiver Signature _______________________________ Date __________  Clinician Signature _______________________________________ Date __________  Please bring this form and your medication list with you to all your follow-up doctor's appointments.

## 2016-03-19 NOTE — Progress Notes (Signed)
[place] PHYSICAL MEDICINE & REHABILITATION     PROGRESS NOTE  Subjective/Complaints:  Patient without complaints today.  ROS: Denies CP, SOB, N/V/D.  Objective: Vital Signs: Blood pressure 162/63, pulse 79, temperature 98.7 F (37.1 C), temperature source Oral, resp. rate 17, height 5' 1"  (1.549 m), SpO2 96 %. No results found.  Recent Labs  03/17/16 0525  WBC 6.3  HGB 11.9*  HCT 37.3  PLT 239    Recent Labs  03/17/16 0525  NA 138  K 3.9  CL 110  GLUCOSE 187*  BUN 19  CREATININE 0.94  CALCIUM 9.2   CBG (last 3)   Recent Labs  03/18/16 1645 03/18/16 2041 03/19/16 0627  GLUCAP 228* 275* 132*    Wt Readings from Last 3 Encounters:  03/13/16 96.9 kg (213 lb 10 oz)  03/04/16 100.245 kg (221 lb)  11/23/15 101.696 kg (224 lb 3.2 oz)    Physical Exam:  BP 162/63 mmHg  Pulse 79  Temp(Src) 98.7 F (37.1 C) (Oral)  Resp 17  Ht 5' 1"  (1.549 m)  SpO2 96% Constitutional: She appears well-developed and well-nourished.  HENT: Normocephalic and atraumatic.  Cardiovascular: Normal rate and regular rhythm.  Respiratory: Effort normal and breath sounds normal. No respiratory distress.  GI: Soft. Bowel sounds are normal. She exhibits no distension.  Musculoskeletal: She exhibits no edema or tenderness.  Neurological: She is alert and oriented.  Follow commands.  Left facial weakness. Motor: RUE: 5/5 proximal to distal RLE: Hip flexion 4+/5, knee extension 4/5, ankle dorsi/plantar flexion 5/5 LUE: 0/5  LLE: able to wiggle toes, otherwise 0/5 Skin: Skin is warm and dry.  Psychiatric:  Mood, affect, behavior WNL.  Assessment/Plan: 1. 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 Physiatrist is providing close team supervision and 24 hour management of active medical problems listed  below. Physiatrist and rehab team continue to assess barriers to discharge/monitor patient progress toward functional and medical goals.  Function:  Bathing Bathing position   Position: Bed  Bathing parts Body parts bathed by patient: Front perineal area, Right upper leg Body parts bathed by helper: Buttocks  Bathing assist Assist Level:  (Max A )      Upper Body Dressing/Undressing Upper body dressing   What is the patient wearing?: Pull over shirt/dress     Pull over shirt/dress - Perfomed by patient: Thread/unthread right sleeve, Put head through opening, Thread/unthread left sleeve Pull over shirt/dress - Perfomed by helper: Pull shirt over trunk, Thread/unthread left sleeve        Upper body assist Assist Level: Touching or steadying assistance(Pt > 75%)      Lower Body Dressing/Undressing Lower body dressing   What is the patient wearing?: Pants, Non-skid slipper socks       Pants- Performed by helper: Thread/unthread right pants leg, Thread/unthread left pants leg, Pull pants up/down   Non-skid slipper socks- Performed by helper: Don/doff right sock, Don/doff left sock                  Lower body assist Assist for lower body dressing:  (Max A)      Toileting Toileting     Toileting steps completed by helper: Adjust clothing prior to toileting, Performs perineal hygiene, Adjust clothing after toileting (per Vance Peper, NT report)    Toileting assist Assist level: Two helpers (per Vance Peper, NT report)  Transfers Chair/bed transfer   Chair/bed transfer method: Stand pivot Chair/bed transfer assist level: Maximal assist (Pt 25 - 49%/lift and lower) Chair/bed transfer assistive device: Sliding board Mechanical lift: Stedy   Locomotion Ambulation Ambulation activity did not occur: Refused   Max distance: 12 Assist level: 2 helpers   Wheelchair   Type: Manual Max wheelchair distance: 50 Assist Level: Maximal assistance (Pt 25 - 49%)   Cognition Comprehension Comprehension assist level: Understands complex 90% of the time/cues 10% of the time  Expression Expression assist level: Expresses complex ideas: With extra time/assistive device  Social Interaction Social Interaction assist level: Interacts appropriately with others with medication or extra time (anti-anxiety, antidepressant).  Problem Solving Problem solving assist level: Solves basic 75 - 89% of the time/requires cueing 10 - 24% of the time  Memory Memory assist level: Recognizes or recalls 75 - 89% of the time/requires cueing 10 - 24% of the time    Medical Problem List and Plan: 1. 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 Status post loop recorder  Cont CIR,Team conference today please see physician documentation under team conference tab, met with team face-to-face to discuss problems,progress, and goals. Formulized individual treatment plan based on medical history, underlying problem and comorbidities.  Splints ordered, pt has not received  Fluoxetine started on 7/7, but pt refused, now, willing to take 2. DVT Prophylaxis/Anticoagulation: SCDs. Monitor for any signs of DVT. Venous Doppler study negative 3. Pain Management: Neurontin 300 mg 3 times a day, Topamax 50 mg twice a day, hydrocodone as needed 4. Diabetes mellitus of peripheral neuropathy. Hemoglobin A1c 7.3.   Lantus insulin 45 units twice a day  NovoLog 15 units 3 times a day, increased to 20U on 7/9.  Check blood sugars before meals and at bedtime. Diabetic teaching   CBG (last 3)   Recent Labs  03/18/16 1645 03/18/16 2041 03/19/16 0627  GLUCAP 228* 275* 132*    5. Neuropsych: This patient is capable of making decisions on her own behalf. 6. Skin/Wound Care: Routine skin checks 7. Fluids/Electrolytes/Nutrition: Routine I&O 8. Hypertension. Coreg 6.25 mg twice a day  Will continue to allow permissive HTN for time  being Filed Vitals:   03/18/16 1906 03/19/16 0429  BP: 148/50 162/63  Pulse: 86 79  Temp:  98.7 F (37.1 C)  Resp:  17   9. Hypothyroidism. Synthroid 10. Hyperlipidemia. Crestor, fenofibrate 11. Asthma. Singulair 10 mg daily, Dulera 2 puffs twice daily, albuterol nebulizer as needed 12. Morbid Obesity. Body mass index is 40.39 kg/(m^2)., Diet and exercise education, cont to encourage weight loss to increase endurance and promote overall health. Dietary follow-up  13. Morganella UTI. 7 day course of Vantin 200 mg every 12 hours times initiated 03/13/2016 14. Obesity hypoventilation syndrome: CPAP  LOS (Days) 5 A FACE TO FACE EVALUATION WAS PERFORMED  Charlett Blake 03/19/2016 9:59 AM

## 2016-03-19 NOTE — Progress Notes (Signed)
Speech Language Pathology Daily Session Note  Patient Details  Name: Danielle Vargas MRN: [id number] Date of Birth: 11-Jan-1951  Today's Date: 03/19/2016 SLP Individual Time:  -  1305-1405    Short Term Goals: Week 1: SLP Short Term Goal 1 (Week 1): Pt will attend to her left upper extremity during basic functional tasks with supervision verbal cues.   SLP Short Term Goal 2 (Week 1): Pt will utilize external memory aids to facilitate recall of information with supervision verbal cues.   SLP Short Term Goal 3 (Week 1): Pt will complete semi-complex familiar tasks with supervision verbal cues for functional problem solving.   SLP Short Term Goal 4 (Week 1): Pt will return demonstration of at least 2 safety precautions with min assist verbal cues.   SLP Short Term Goal 5 (Week 1): Pt will selectively attend to semi-complex tasks for 15 minute intervals in mildly distracting environment with supervision verbal cues for redirection.    Skilled Therapeutic Interventions:  Therapy focused on skilled treatment for facilitation of cognitive abilities. Mild-moderate tangentiality with min cues x 2 to redirect attention to activity. Reviewed memory strategies which pt states utilizing associations and discussed using writing for prospective memory and recall of new information. Pt requested pen and paper; SLP asked a reminder at end of session which she was successful (45 min delay) although she needed min prompt to recall where she asked SLP to put Kindle earlier in session. Moderate cues to recall and state safety precautions during PT/OT (stand to sit & vice versa). She completed executive function activities with 100% accuracy without cues and demonstrated self monitoring and correcting of error x 2.                                                              Function:  Eating Eating                 Cognition Comprehension Comprehension assist level: Understands complex 90% of the time/cues  10% of the time  Expression   Expression assist level: Expresses basic 90% of the time/requires cueing < 10% of the time.  Social Interaction Social Interaction assist level: Interacts appropriately 90% of the time - Needs monitoring or encouragement for participation or interaction.  Problem Solving Problem solving assist level: Solves basic 75 - 89% of the time/requires cueing 10 - 24% of the time  Memory Memory assist level: Recognizes or recalls 75 - 89% of the time/requires cueing 10 - 24% of the time    Pain Pain Assessment Pain Score: 3   Therapy/Group: Individual Therapy  Houston Siren 03/19/2016, 2:42 PM

## 2016-03-19 NOTE — Progress Notes (Signed)
Social Work Patient ID: Danielle Vargas, female   DOB: [birth year], 65 y.o.   MRN: [id number] Met with pt and spoke with daughter via telephone to discuss team conference goals mod assist and target to leave here 7/25 onto the next venue. Pt would like to go home from here but has to be mod/i since her daughter works and can not be there with her. Discussed if they had a pref on NH but have never been  through this before and dont know. Will work on having daughter visit area NH and let me know a preference, since this is the plan for discharge.

## 2016-03-19 NOTE — Patient Care Conference (Signed)
Inpatient RehabilitationTeam Conference and Plan of Care Update Date: 03/19/2016   Time: 11:20 Am    Patient Name: Danielle Vargas      Medical Record Number: [id number]  Date of Birth: 12-29-1950 Sex: Female         Room/Bed: 4W11C/4W11C-01 Payor Info: Payor: MEDICARE / Plan: MEDICARE PART A AND B / Product Type: *No Product type* /    Admitting Diagnosis: CVA  Admit Date/Time:  03/14/2016  6:38 PM Admission Comments: No comment available   Primary Diagnosis:  <principal problem not specified> Principal Problem: <principal problem not specified>  Patient Active Problem List   Diagnosis Date Noted  . DM type 2 with diabetic peripheral neuropathy ([place])   . Vascular headache   . Right middle cerebral artery stroke ([place]) 03/14/2016  . Asthma   . Acute lower UTI   . Benign essential HTN   . HLD (hyperlipidemia)   . S/P rotator cuff repair   . Tachypnea   . AKI (acute kidney injury) ([place])   . Acute right MCA stroke ([place])   . Back pain   . Ischemic stroke ([place]) 03/10/2016  . Stroke (cerebrum) ([place]) 03/10/2016  . Chest pain, rule out acute myocardial infarction 11/10/2015  . Chest pain 11/10/2015  . Obesity   . Hypothyroidism   . 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 UTI (urinary tract infection) 06/16/2015  . Diabetes mellitus ([place]) 06/16/2015  . RUQ abdominal pain 04/17/2015  . Hx of colonic polyps 04/17/2015  . Family hx of colon cancer 04/17/2015  . CPAP rhinitis 12/08/2014  . OSA on CPAP 12/08/2014  . 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 Retinopathy due to secondary diabetes mellitus, without macular edema, with moderate nonproliferative retinopathy ([place]) 09/18/2014  . Neuropathy due to secondary diabetes mellitus ([place]) 09/18/2014  . Diabetes mellitus due to underlying condition with other diabetic neurological complication ([place]) [id number]  . Obesity hypoventilation syndrome ([place]) 09/18/2014  . CAD in native artery 01/23/2014  . Morbid obesity ([place]) 01/23/2014   . Renal insufficiency 11/17/2013  . HTN (hypertension) 10/29/2013  . Chronic diastolic heart failure ([place]) 10/29/2013  . 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 LADA (latent autoimmune diabetes in adults), managed as type 1 ([place]) 08/08/2013    Expected Discharge Date: Expected Discharge Date: 04/01/16  Team Members Present: Physician leading conference: Dr. Alysia Penna Social Worker Present: Ovidio Kin, LCSW Nurse Present: Heather Roberts, RN PT Present: Kem Parkinson, PT OT Present: Simonne Come, OT SLP Present: Windell Moulding, SLP PPS Coordinator present : Daiva Nakayama, RN, CRRN     Current Status/Progress Goal Weekly Team Focus  Medical   has decreased processing speed, occ pain c/os  maintain medical stability during rehab stay transition back to PCP  increase endurance   Bowel/Bladder   Contient of bowel and bladder periods of incontinent during the night  Continent of bowel and bladder   timed toileting at night   Swallow/Nutrition/ Hydration             ADL's   Max A for LB dressing and bathing; Mod A overall UB dressing;   Mod A bathing, LB dressing, and toilet transfers; Min A UB dressing   improved activity tolerance, transfers, trunk control, ADL retraining   Mobility   modA bed mobility, maxA transfers, +2 gait   S bed mobility, S transfers, minA gait   participation, activity tolerance, gait training, dynamic sitting/standing balance   Communication             Safety/Cognition/  Behavioral Observations            Pain   Pain control with prn pain meds  Pain level at or below 3 with min assist  Monitor pain q shift and prn and treat as needed   Skin   Bottom is red but blanchable with allevyn dressing in place  Remain free from skin breakdown/infection with min assist  Monitor skin q shift and prn       *See Care Plan and progress notes for long and short-term goals.  Barriers to Discharge: still requires physical assist,     Possible Resolutions  to Barriers:  cont rehab    Discharge Planning/Teaching Needs:  Would like to go home but does not have 24 hr care, so plan is probably NHP from here      Team Discussion:  Currently max assist level, goals for mod assist. Making slow progress, fatigues easily. Attention is poor and very distractible which limits progress. Pain from fall at home prior to admission with ribs. At times has difficulty with buy in and participating in therapies. Admission is to lessen the burden of care and to get to a higher level before transfer to NH. Will begin process with daughter  Revisions to Treatment Plan:  NHP   Continued Need for Acute Rehabilitation Level of Care: The patient requires daily medical management by a physician with specialized training in physical medicine and rehabilitation for the following conditions: Daily direction of a multidisciplinary physical rehabilitation program to ensure safe treatment while eliciting the highest outcome that is of practical value to the patient.: Yes Daily medical management of patient stability for increased activity during participation in an intensive rehabilitation regime.: Yes Daily analysis of laboratory values and/or radiology reports with any subsequent need for medication adjustment of medical intervention for : Neurological problems  [name] 03/19/2016, 3:44 PM

## 2016-03-20 ENCOUNTER — Inpatient Hospital Stay (HOSPITAL_COMMUNITY): Payer: Medicare Other | Admitting: Occupational Therapy

## 2016-03-20 ENCOUNTER — Inpatient Hospital Stay (HOSPITAL_COMMUNITY): Payer: Medicare Other | Admitting: Speech Pathology

## 2016-03-20 ENCOUNTER — Inpatient Hospital Stay (HOSPITAL_COMMUNITY): Payer: Medicare Other | Admitting: Physical Therapy

## 2016-03-20 DIAGNOSIS — M7711 Lateral epicondylitis, right elbow: Secondary | ICD-10-CM

## 2016-03-20 LAB — GLUCOSE, CAPILLARY
Glucose-Capillary: 165 mg/dL — ABNORMAL HIGH (ref 65–99)
Glucose-Capillary: 199 mg/dL — ABNORMAL HIGH (ref 65–99)
Glucose-Capillary: 182 mg/dL — ABNORMAL HIGH (ref 65–99)

## 2016-03-20 MED ORDER — DICLOFENAC SODIUM 1 % TD GEL
2.0000 g | Freq: Four times a day (QID) | TRANSDERMAL | Status: DC
  Administered 2016-03-20 – 2016-03-31 (×19): 2 g via TOPICAL
  Filled 2016-03-20: qty 100

## 2016-03-20 NOTE — Progress Notes (Signed)
Occupational Therapy Session Note  Patient Details  Name: Danielle Vargas MRN: [id number] Date of Birth: [birth year]  Today's Date: 03/20/2016 OT Individual Time: [id number] OT Individual Time Calculation (min): 57 min    Short Term Goals: Week 1:  OT Short Term Goal 1 (Week 1): Pt will sit on EOB with S for 5 min to prepare for transfer. OT Short Term Goal 2 (Week 1): Pt will be able to squat pivot transfer to W/c with mod A +2 to prepare for toilet transfers.  OT Short Term Goal 3 (Week 1): Pt will tolerate standing in stedy lift for 2 minutes for clothing change. OT Short Term Goal 4 (Week 1): Pt will be independent with self ROM program for LUE.  Skilled Therapeutic Interventions/Progress Updates:    Treatment session with focus on feeding seated postural stability, NMR, and standing balance.   Pt c/o inability to complete feeding due to hemiplegic LUE. Problem solved tray set up and feeding activities with use of LUE as stabilizer. Pt verbalized and demonstrated understanding.   Pt completed sit > stand transfers from [facility] to EOM with Min A. Engaged pt in D1/D2 flexion/extension pattern activity seated at EOM with folded towel under L buttocks to engage L lateral torso and alleviate observed R lateral lean/engagement.   Pt completed x2 sit > stand from EOM with reports of "pain". Discussed pt progress in sit > stand with pt reporting fear of falling as barrier to challenge standing tolerance at present despite x2 supports available.   Continue to challenge pt overall endurance and activity tolerance and standing tolerance. Grade activities according to psychometric properties to increase pt willingness to participate.   Pt left in room with all needs in reach.   Therapy Documentation Precautions:  Precautions Precautions: Fall Restrictions Weight Bearing Restrictions: No General:   Vital Signs:  Pain: Pain Assessment Pain Score: 4  ADL: ADL ADL Comments: refer to  functional navigator Exercises:   Other Treatments:    See Function Navigator for Current Functional Status.   Therapy/Group: Individual Therapy  Dierdre Searles 03/20/2016, 11:58 AM

## 2016-03-20 NOTE — Progress Notes (Signed)
Pt. placed on CPAP after Dulera inhaler given, tolerating well on room air.

## 2016-03-20 NOTE — Progress Notes (Signed)
Physical Therapy Session Note  Patient Details  Name: Danielle Vargas MRN: [id number] Date of Birth: December 17, 1950  Today's Date: 03/20/2016 PT Individual Time: 0800-0900 PT Individual Time Calculation (min): 60 min   Short Term Goals: Week 1:  PT Short Term Goal 1 (Week 1): Patient will performed sit<>stand with mod A from PT PT Short Term Goal 2 (Week 1): Patient will initiate gait training.  PT Short Term Goal 3 (Week 1): Patient will perform bed mobility with mod A from PT.  PT Short Term Goal 4 (Week 1): Patient will perform chair<> bed transfer with mod A.  PT Short Term Goal 5 (Week 1): Patient will perform WC mobility for 136ft with min A.   Skilled Therapeutic Interventions/Progress Updates:    Pt received seated in bed, denies pain and agreeable to treatment. Pt frustrated that she is unable to self-feed efficiently d/t LUE flaccidity, cannot open small packages for butter, creamer, etc. Will discuss with OT to perform a session with pt to work on compensatory strategies. Rolling R/L S with bedrails to change brief and perform hygiene after incontinent bladder accident. Pt reports she is aware when she is urinating however does not have urgency beforehand. Discussed importance of skin integrity with pt and importance of alerting staff when she has urinated or had bowel movement in order to change brief quickly and prevent skin breakdown. Supine>sit modA for facilitation of trunk flexion and R weight shift once sitting. Dons shirt minA at EOB with assist for threading LUE and pulling shirt over trunk. Sit <>stand to don pants maxA with pt only assisting to pull up pants on R side. Stand pivot to w/c maxA for LLE progression and cueing for sequencing. Stand pivot transfer x2 w/c <>mat table with maxA. Sit <>stand from edge of mat table x5 trials with final trials including RLE tapping to 1 inch step to facilitate LLE weight bearing and stance control. In sitting L hip wedged for improved midline  postural control, L trunk shortening and R trunk lengthening. Pt requires increased time to complete all tasks due to fear, frustration; improved compliance when pt engaged in unrelated conversation and given appropriate amount of rest breaks. Returned to room totalA for energy conservation; remained seated in w/c with all needs in reach at completion of session.   Therapy Documentation Precautions:  Precautions Precautions: Fall Restrictions Weight Bearing Restrictions: No Pain: Pain Assessment Pain Score: 4  Faces Pain Scale: Hurts a little bit   See Function Navigator for Current Functional Status.   Therapy/Group: Individual Therapy  Luberta Mutter 03/20/2016, 10:01 AM

## 2016-03-20 NOTE — Progress Notes (Signed)
Speech Language Pathology Daily Session Note  Patient Details  Name: Danielle Vargas MRN: [id number] Date of Birth: 1950/10/04  Today's Date: 03/20/2016 SLP Individual Time: 1100-1200 SLP Individual Time Calculation (min): 60 min  Short Term Goals: Week 1: SLP Short Term Goal 1 (Week 1): Pt will attend to her left upper extremity during basic functional tasks with supervision verbal cues.   SLP Short Term Goal 2 (Week 1): Pt will utilize external memory aids to facilitate recall of information with supervision verbal cues.   SLP Short Term Goal 3 (Week 1): Pt will complete semi-complex familiar tasks with supervision verbal cues for functional problem solving.   SLP Short Term Goal 4 (Week 1): Pt will return demonstration of at least 2 safety precautions with min assist verbal cues.   SLP Short Term Goal 5 (Week 1): Pt will selectively attend to semi-complex tasks for 15 minute intervals in mildly distracting environment with supervision verbal cues for redirection.    Skilled Therapeutic Interventions: Pt seen for skilled SLP therapy to address cognitive-linguistic goals. Pt able to attend to therapy task for 15 min with min verbal cueing. Visual scanning activity completed with 90% acc mod I. Solving moderate level word problems 9/10 acc with min A for repetition of information. Pt accurately used written language as a compensatory strategy for organization of information. Lack of flexibility noted with pt unable to shift perspective for reasoning despite max cueing.    Function:  Eating Eating   Modified Consistency Diet: No Eating Assist Level: Set up assist for   Eating Set Up Assist For: Opening containers;Cutting food       Cognition Comprehension Comprehension assist level: Follows basic conversation/direction with no assist  Expression   Expression assist level: Expresses complex 90% of the time/cues < 10% of the time  Social Interaction Social Interaction assist level:  Interacts appropriately 90% of the time - Needs monitoring or encouragement for participation or interaction.  Problem Solving Problem solving assist level: Solves basic 75 - 89% of the time/requires cueing 10 - 24% of the time  Memory Memory assist level: Recognizes or recalls 90% of the time/requires cueing < 10% of the time    Pain Pain Assessment Pain Assessment: No/denies pain Pain Score: 4   Therapy/Group: Individual Therapy  Vinetta Bergamo MA, CCC-SLP 03/20/2016, 12:37 PM

## 2016-03-20 NOTE — Progress Notes (Signed)
Speech Language Pathology Daily Session Note  Patient Details  Name: Danielle Vargas MRN: [id number] Date of Birth: 1951-08-09  Today's Date: 03/20/2016 SLP Individual Time: 1350-1433 SLP Individual Time Calculation (min): 43 min  Short Term Goals: Week 1: SLP Short Term Goal 1 (Week 1): Pt will attend to her left upper extremity during basic functional tasks with supervision verbal cues.   SLP Short Term Goal 2 (Week 1): Pt will utilize external memory aids to facilitate recall of information with supervision verbal cues.   SLP Short Term Goal 3 (Week 1): Pt will complete semi-complex familiar tasks with supervision verbal cues for functional problem solving.   SLP Short Term Goal 4 (Week 1): Pt will return demonstration of at least 2 safety precautions with min assist verbal cues.   SLP Short Term Goal 5 (Week 1): Pt will selectively attend to semi-complex tasks for 15 minute intervals in mildly distracting environment with supervision verbal cues for redirection.    Skilled Therapeutic Interventions: Pt was seen for skilled ST targeting cognitive goals.  SLP facilitated the session with a novel card game targeting functional problem solving.   Pt required overall supervision verbal cues to plan and execute a problem solving strategy during the abovementioned tasks.  SLP increased task complexity by incorporating time constraints to address initiation and processing speed.  With added task challenge pt required up to min assist verbal cues for organization.  Pt was left in bedroom with call bell within reach and bed alarm set.  Continue per current plan of care.     Function:  Eating Eating                 Cognition Comprehension Comprehension assist level: Follows basic conversation/direction with no assist  Expression   Expression assist level: Expresses complex 90% of the time/cues < 10% of the time  Social Interaction Social Interaction assist level: Interacts appropriately 90%  of the time - Needs monitoring or encouragement for participation or interaction.  Problem Solving Problem solving assist level: Solves basic 75 - 89% of the time/requires cueing 10 - 24% of the time  Memory Memory assist level: Recognizes or recalls 90% of the time/requires cueing < 10% of the time    Pain Pain Assessment Pain Assessment: No/denies pain   Therapy/Group: Individual Therapy  Shawnetta Lein, Selinda Orion 03/20/2016, 4:27 PM

## 2016-03-20 NOTE — Progress Notes (Addendum)
[facility] PHYSICAL MEDICINE & REHABILITATION     PROGRESS NOTE  Subjective/Complaints:  Complains of right elbow pain. This is mainly with forceful gripping. Started during therapy. Relieved with resting it. No prior trauma to that area. Would like me to check her toes as well she states in acute care her right toes were stepped on and while in rehabilitation her left toes were stepped on  ROS: Denies CP, SOB, N/V/D.  Objective: Vital Signs: Blood pressure 160/58, pulse 78, temperature 98.5 F (36.9 C), temperature source Oral, resp. rate 17, height 5\' 1"  (1.549 m), SpO2 94 %. No results found. No results for input(s): WBC, HGB, HCT, PLT in the last 72 hours. No results for input(s): NA, K, CL, GLUCOSE, BUN, CREATININE, CALCIUM in the last 72 hours.  Invalid input(s): CO CBG (last 3)   Recent Labs  03/19/16 1657 03/19/16 2032 03/20/16 0649  GLUCAP 194* 280* 165*    Wt Readings from Last 3 Encounters:  03/13/16 96.9 kg (213 lb 10 oz)  03/04/16 100.245 kg (221 lb)  11/23/15 101.696 kg (224 lb 3.2 oz)    Physical Exam:  BP 160/58 mmHg  Pulse 78  Temp(Src) 98.5 F (36.9 C) (Oral)  Resp 17  Ht 5\' 1"  (1.549 m)  SpO2 94% Constitutional: She appears well-developed and well-nourished.  HENT: Normocephalic and atraumatic.  Cardiovascular: Normal rate and regular rhythm.  Respiratory: Effort normal and breath sounds normal. No respiratory distress.  GI: Soft. Bowel sounds are normal. She exhibits no distension.  Musculoskeletal: She exhibits no edema or tenderness.  Neurological: She is alert and oriented.  Follow commands.  Left facial weakness. Motor: RUE: 5/5 proximal to distal RLE: Hip flexion 4+/5, knee extension 4/5, ankle dorsi/plantar flexion 5/5 LUE: 0/5  LLE: able to wiggle toes, otherwise 0/5 Skin: Skin is warm and dry.  Psychiatric:  Mood, affect, behavior WNL. extremities show no evidence of edema and no pain with range of motion of the toes of  either foot. No erythema Assessment/Plan: 1. 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 Physiatrist is providing close team supervision and 24 hour management of active medical problems listed below. Physiatrist and rehab team continue to assess barriers to discharge/monitor patient progress toward functional and medical goals.  Function:  Bathing Bathing position   Position: Bed  Bathing parts Body parts bathed by patient: Front perineal area, Right upper leg Body parts bathed by helper: Buttocks  Bathing assist Assist Level:  (Max A )      Upper Body Dressing/Undressing Upper body dressing   What is the patient wearing?: Pull over shirt/dress     Pull over shirt/dress - Perfomed by patient: Thread/unthread right sleeve, Put head through opening Pull over shirt/dress - Perfomed by helper: Pull shirt over trunk, Thread/unthread left sleeve        Upper body assist Assist Level: Touching or steadying assistance(Pt > 75%)      Lower Body Dressing/Undressing Lower body dressing   What is the patient wearing?: Pants       Pants- Performed by helper: Thread/unthread right pants leg, Thread/unthread left pants leg, Pull pants up/down   Non-skid slipper socks- Performed by helper: Don/doff right sock, Don/doff left sock                  Lower body assist Assist for lower body dressing:  (maxA)  Toileting Toileting   Toileting steps completed by patient: Performs perineal hygiene Toileting steps completed by helper: Adjust clothing prior to toileting, Adjust clothing after toileting Toileting Assistive Devices: Grab bar or rail  Toileting assist Assist level: Two helpers   Transfers Chair/bed transfer   Chair/bed transfer method: Stand pivot Chair/bed transfer assist level: Maximal assist (Pt 25 -  49%/lift and lower) Chair/bed transfer assistive device: Armrests Mechanical lift: Stedy   Locomotion Ambulation Ambulation activity did not occur: Refused   Max distance: 12 Assist level: 2 helpers   Wheelchair   Type: Manual Max wheelchair distance: 50 Assist Level: Maximal assistance (Pt 25 - 49%)  Cognition Comprehension Comprehension assist level: Understands complex 90% of the time/cues 10% of the time  Expression Expression assist level: Expresses basic 90% of the time/requires cueing < 10% of the time.  Social Interaction Social Interaction assist level: Interacts appropriately 75 - 89% of the time - Needs redirection for appropriate language or to initiate interaction.  Problem Solving Problem solving assist level: Solves basic 75 - 89% of the time/requires cueing 10 - 24% of the time  Memory Memory assist level: Recognizes or recalls 75 - 89% of the time/requires cueing 10 - 24% of the time    Medical Problem List and Plan: 1. 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 Status post loop recorder  Cont CIR, PT, OT, speech therapy  Splints ordered, pt has not received  Fluoxetine started on 7/7, but pt refused, now, willing to take 2. DVT Prophylaxis/Anticoagulation: SCDs. Monitor for any signs of DVT. Venous Doppler study negative 3. Pain Management: Neurontin 300 mg 3 times a day, Topamax 50 mg twice a day, hydrocodone as needed, lateral epicondylitis right elbow, will start Voltaren gel 4. Diabetes mellitus of peripheral neuropathy. Hemoglobin A1c 7.3.   Lantus insulin increased to47 units twice a day on 7/13  NovoLog 15 units 3 times a day, increased to 20U on 7/9.  Check blood sugars before meals and at bedtime. Diabetic teaching   CBG (last 3)   Recent Labs  03/19/16 1657 03/19/16 2032 03/20/16 0649  GLUCAP 194* 280* 165*    5. Neuropsych: This patient is capable of making decisions on her  own behalf. 6. Skin/Wound Care: Routine skin checks 7. Fluids/Electrolytes/Nutrition: Routine I&O 8. Hypertension. Coreg 6.25 mg twice a day, heart rate is in a good range.  Will continue to allow permissive HTN for time being Filed Vitals:   03/19/16 2228 03/20/16 0444  BP:  160/58  Pulse: 79 78  Temp:  98.5 F (36.9 C)  Resp: 16 17   9. Hypothyroidism. Synthroid 10. Hyperlipidemia. Crestor, fenofibrate 11. Asthma. Singulair 10 mg daily, Dulera 2 puffs twice daily, albuterol nebulizer as needed 12. Morbid Obesity. Body mass index is 40.39 kg/(m^2)., Diet and exercise education, cont to encourage weight loss to increase endurance and promote overall health. Dietary follow-up  13. Morganella UTI. 7 day course of Vantin 200 mg every 12 hours times initiated 03/13/2016 14. Obesity hypoventilation syndrome: CPAP  LOS (Days) 6 A FACE TO FACE EVALUATION WAS PERFORMED  Charlett Blake 03/20/2016 10:25 AM

## 2016-03-21 ENCOUNTER — Inpatient Hospital Stay (HOSPITAL_COMMUNITY): Payer: Medicare Other | Admitting: Physical Therapy

## 2016-03-21 ENCOUNTER — Inpatient Hospital Stay (HOSPITAL_COMMUNITY): Payer: Medicare Other | Admitting: Occupational Therapy

## 2016-03-21 ENCOUNTER — Inpatient Hospital Stay (HOSPITAL_COMMUNITY): Payer: Medicare Other | Admitting: Speech Pathology

## 2016-03-21 LAB — GLUCOSE, CAPILLARY
GLUCOSE-CAPILLARY: 196 mg/dL — AB (ref 65–99)
GLUCOSE-CAPILLARY: 205 mg/dL — AB (ref 65–99)
GLUCOSE-CAPILLARY: 141 mg/dL — AB (ref 65–99)
Glucose-Capillary: 236 mg/dL — ABNORMAL HIGH (ref 65–99)

## 2016-03-21 MED ORDER — HYDROCODONE-ACETAMINOPHEN 5-325 MG PO TABS
1.0000 | ORAL_TABLET | Freq: Four times a day (QID) | ORAL | Status: DC | PRN
  Administered 2016-03-21 – 2016-04-02 (×36): 1 via ORAL
  Filled 2016-03-21 (×36): qty 1

## 2016-03-21 NOTE — Progress Notes (Signed)
[place] PHYSICAL MEDICINE & REHABILITATION     PROGRESS NOTE  Subjective/Complaints:  Elbow pain improved No new issues  ROS: Denies CP, SOB, N/V/D.  Objective: Vital Signs: Blood pressure 154/78, pulse 88, temperature 97.4 F (36.3 C), temperature source Oral, resp. rate 16, height 5\' 1"  (1.549 m), SpO2 98 %. No results found. No results for input(s): WBC, HGB, HCT, PLT in the last 72 hours. No results for input(s): NA, K, CL, GLUCOSE, BUN, CREATININE, CALCIUM in the last 72 hours.  Invalid input(s): CO CBG (last 3)   Recent Labs  03/21/16 0631 03/21/16 1105 03/21/16 1620  GLUCAP 196* 205* 141*    Wt Readings from Last 3 Encounters:  03/13/16 96.9 kg (213 lb 10 oz)  03/04/16 100.245 kg (221 lb)  11/23/15 101.696 kg (224 lb 3.2 oz)    Physical Exam:  BP 154/78 mmHg  Pulse 88  Temp(Src) 97.4 F (36.3 C) (Oral)  Resp 16  Ht 5\' 1"  (1.549 m)  SpO2 98% Constitutional: She appears well-developed and well-nourished.  HENT: Normocephalic and atraumatic.  Cardiovascular: Normal rate and regular rhythm.  Respiratory: Effort normal and breath sounds normal. No respiratory distress.  GI: Soft. Bowel sounds are normal. She exhibits no distension.  Musculoskeletal: She exhibits no edema or tenderness.  Neurological: She is alert and oriented.  Follow commands.  Left facial weakness. Motor: RUE: 5/5 proximal to distal RLE: Hip flexion 4+/5, knee extension 4/5, ankle dorsi/plantar flexion 5/5 LUE: 0/5  LLE: able to wiggle toes, otherwise 0/5 Skin: Skin is warm and dry.  Psychiatric:  Mood, affect,brighter extremities show no evidence of edema and no pain with range of motion of the toes of either foot. No erythema Assessment/Plan: 1. 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 Physiatrist is providing  close team supervision and 24 hour management of active medical problems listed below. Physiatrist and rehab team continue to assess barriers to discharge/monitor patient progress toward functional and medical goals.  Function:  Bathing Bathing position   Position: Wheelchair/chair at sink  Bathing parts Body parts bathed by patient: Chest, Abdomen Body parts bathed by helper: Back, Right arm, Left arm  Bathing assist Assist Level:  (Max A)      Upper Body Dressing/Undressing Upper body dressing   What is the patient wearing?: Pull over shirt/dress     Pull over shirt/dress - Perfomed by patient: Put head through opening, Thread/unthread right sleeve Pull over shirt/dress - Perfomed by helper: Pull shirt over trunk, Thread/unthread left sleeve        Upper body assist Assist Level:  (Mod A)      Lower Body Dressing/Undressing Lower body dressing   What is the patient wearing?: Pants       Pants- Performed by helper: Thread/unthread right pants leg, Thread/unthread left pants leg, Pull pants up/down   Non-skid slipper socks- Performed by helper: Don/doff right sock, Don/doff left sock                  Lower body assist Assist for lower body dressing:  (Max A)      Toileting Toileting   Toileting steps completed by patient: Performs perineal hygiene Toileting steps completed by helper: Adjust clothing prior to toileting, Adjust clothing after toileting Toileting Assistive Devices: Grab bar or rail  Toileting assist Assist level: Two helpers   Transfers Chair/bed transfer  Chair/bed transfer method: Stand pivot Chair/bed transfer assist level: Moderate assist (Pt 50 - 74%/lift or lower) Chair/bed transfer assistive device: Walker Mechanical lift: Architectural technologist activity did not occur: Refused   Max distance: 15 Assist level: 2 helpers (modA, w/c follow for safety)   Wheelchair   Type: Manual Max wheelchair distance:  50 Assist Level: Maximal assistance (Pt 25 - 49%)  Cognition Comprehension Comprehension assist level: Follows basic conversation/direction with no assist  Expression Expression assist level: Expresses basic 90% of the time/requires cueing < 10% of the time.  Social Interaction Social Interaction assist level: Interacts appropriately 75 - 89% of the time - Needs redirection for appropriate language or to initiate interaction.  Problem Solving Problem solving assist level: Solves basic 75 - 89% of the time/requires cueing 10 - 24% of the time  Memory Memory assist level: Recognizes or recalls 90% of the time/requires cueing < 10% of the time    Medical Problem List and Plan: 1. 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 Status post loop recorder  Cont CIR, PT, OT, speech therapy  Splints ordered, pt has not received  Fluoxetine started on 7/7, but pt refused, now, willing to take 2. DVT Prophylaxis/Anticoagulation: SCDs. Monitor for any signs of DVT. Venous Doppler study negative 3. Pain Management: Neurontin 300 mg 3 times a day, Topamax 50 mg twice a day, hydrocodone as needed, lateral epicondylitis right elbow, will start Voltaren gel 4. Diabetes mellitus of peripheral neuropathy. Hemoglobin A1c 7.3.   Lantus insulin increased to47 units twice a day on 7/13-will monitor  NovoLog 15 units 3 times a day, increased to 20U on 7/9.  Check blood sugars before meals and at bedtime. Diabetic teaching   CBG (last 3)   Recent Labs  03/21/16 0631 03/21/16 1105 03/21/16 1620  GLUCAP 196* 205* 141*    5. Neuropsych: This patient is capable of making decisions on her own behalf. 6. Skin/Wound Care: Routine skin checks 7. Fluids/Electrolytes/Nutrition: Routine I&O 8. Hypertension. Coreg 6.25 mg twice a day, heart rate is in a good range.  Will continue to allow permissive HTN for time being Filed Vitals:   03/21/16 0534  03/21/16 1428  BP: 160/56 154/78  Pulse: 74 88  Temp: 98.6 F (37 C) 97.4 F (36.3 C)  Resp: 18 16   9. Hypothyroidism. Synthroid 10. Hyperlipidemia. Crestor, fenofibrate 11. Asthma. Singulair 10 mg daily, Dulera 2 puffs twice daily, albuterol nebulizer as needed 12. Morbid Obesity. Body mass index is 40.39 kg/(m^2)., Diet and exercise education, cont to encourage weight loss to increase endurance and promote overall health. Dietary follow-up  13. Morganella UTI. 7 day course of Vantin 200 mg every 12 hours times initiated 03/13/2016 14. Obesity hypoventilation syndrome: CPAP  LOS (Days) 7 A FACE TO FACE EVALUATION WAS PERFORMED  Charlett Blake 03/21/2016 7:32 PM

## 2016-03-21 NOTE — Progress Notes (Signed)
Physical Therapy Session Note  Patient Details  Name: Danielle Vargas MRN: [id number] Date of Birth: 04/21/1951  Today's Date: 03/21/2016 PT Individual Time: [id number] and 1115-1200 PT Individual Time Calculation (min): 45 min and 45 min (total 90 min)   Short Term Goals: Week 1:  PT Short Term Goal 1 (Week 1): Patient will performed sit<>stand with mod A from PT PT Short Term Goal 2 (Week 1): Patient will initiate gait training.  PT Short Term Goal 3 (Week 1): Patient will perform bed mobility with mod A from PT.  PT Short Term Goal 4 (Week 1): Patient will perform chair<> bed transfer with mod A.  PT Short Term Goal 5 (Week 1): Patient will perform WC mobility for 161ft with min A.   Skilled Therapeutic Interventions/Progress Updates:    Tx 1: Pt received seated in w/c, denies pain and agreeable to treatment. Pt excited to show therapist emerging AROM in L extremities. Gait 2x15' with R rail in hall and modA for LLE progression and to reduce recurvatum, w/c follow for safety. Pt with increasing gait speed and confidence with walking. Stand pivot transfer with RW w/c <>mat table with modA for LLE progression and RW management. Sit <>stand from edge of mat table with minA, alternating RLE taps to 1" step to facilitate L weight shift and stance control; maxA for L knee stability. W/c propulsion x50' with modA due to difficulty using RLE to A with steering. Returned to room remaining distance totalA for energy conservation. Sit <>stand to remove/replace cushion for w/c propulsion with min guard and RW for stability. Remained seated in w/c at completion of session, all needs within reach.   Tx 2: Pt received seated in w/c, denies pain and agreeable to treatment. Stand pivot transfer w/c <>bed with modA to progress and stabilize LLE, min cues and assist for RW management. Sit <>supine x2 in hospital bed with mod/maxA. Rolling R/L with min guard and bedrails to A with changing brief and performing  hygiene. No incontinent accident, however pt did not feel she had been adequately cleaned before and needed to put clean pants on. Stand pivot transfer w/c <>flat bed in ADL apartment with RW and modA as above. Sit <>supine with minA for LLE management on flat bed without rails; min cues for technique and sequencing. Returned to room totalA for energy conservation, remained seated with all needs in reach at completion of session.   Therapy Documentation Precautions:  Precautions Precautions: Fall Restrictions Weight Bearing Restrictions: No Pain: Pain Assessment Pain Assessment: No/denies pain   See Function Navigator for Current Functional Status.   Therapy/Group: Individual Therapy  Luberta Mutter 03/21/2016, 3:45 PM

## 2016-03-21 NOTE — Progress Notes (Signed)
Occupational Therapy Session Note  Patient Details  Name: Danielle Vargas MRN: [id number] Date of Birth: [birth year]  Today's Date: 03/21/2016 OT Individual Time: 4388-8757 OT Individual Time Calculation (min): 60 min    Short Term Goals: Week 1:  OT Short Term Goal 1 (Week 1): Pt will sit on EOB with S for 5 min to prepare for transfer. OT Short Term Goal 2 (Week 1): Pt will be able to squat pivot transfer to W/c with mod A +2 to prepare for toilet transfers.  OT Short Term Goal 3 (Week 1): Pt will tolerate standing in stedy lift for 2 minutes for clothing change. OT Short Term Goal 4 (Week 1): Pt will be independent with self ROM program for LUE.  Skilled Therapeutic Interventions/Progress Updates:    Treatment session with focus on HEP, pt education, and self care. Pt met in [facility] of room, awake, alert, and agreeable to therapy.   Pt reports breakfast tray set up to have worked this morning. Education provided regarding self advocacy during activities in which she feels uncomfortable or unsafe. Pt verbalized agreement and understanding. Pt also reports and demonstrated return of some function in LUE and LLE with demonstration of LUE abduction and elbow flexion, and LLE knee extension and hip flexion. Provided tapping at shoulder to encourage movement initiation and engagement which pt reported to feel like a "good charlie horse pain" which was unrated. Pt reports and demonstrated the same return of movement for physician during visit.   HEP for self ROM program provided with demonstration and education for importance of facilitating affected extremity in ROM. Pt demonstrated and verbalized agreement.   UB bathing completed at sink with sit > stand from [facility]. Pt tolerated standing x2 for 2 and 3 minutes with close CGA and sink for support. Pt demonstrated forward chaining in dressing tasks completion such as bringing LLE forward in extension for clinician to thread pants leg.  Education provided  regarding proprioceptive input of LUE during functional tasks to facilitate motor recovery. Dressing completed to pt satisfaction. Pt initially reported desire to wear hospital gown for the day due to "chafing" from shirt then later asked to wear shirt. Clinician sought and obtained clarification regarding pt request to ensure understanding.   Left pt in [facility] in room with all needs in reach.    Therapy Documentation Precautions:  Precautions Precautions: Fall Restrictions Weight Bearing Restrictions: No General:   Vital Signs:  Pain: Pain Assessment Pain Assessment: No/denies pain ADL: ADL ADL Comments: refer to functional navigator  See Function Navigator for Current Functional Status.   Therapy/Group: Individual Therapy  Dierdre Searles 03/21/2016, 12:15 PM

## 2016-03-21 NOTE — Progress Notes (Signed)
Speech Language Pathology Daily Session Note  Patient Details  Name: Danielle Vargas MRN: [id number] Date of Birth: Mar 10, 1951  Today's Date: 03/21/2016 SLP Individual Time: [id number] SLP Individual Time Calculation (min): 55 min  Short Term Goals: Week 1: SLP Short Term Goal 1 (Week 1): Pt will attend to her left upper extremity during basic functional tasks with supervision verbal cues.   SLP Short Term Goal 2 (Week 1): Pt will utilize external memory aids to facilitate recall of information with supervision verbal cues.   SLP Short Term Goal 3 (Week 1): Pt will complete semi-complex familiar tasks with supervision verbal cues for functional problem solving.   SLP Short Term Goal 4 (Week 1): Pt will return demonstration of at least 2 safety precautions with min assist verbal cues.   SLP Short Term Goal 5 (Week 1): Pt will selectively attend to semi-complex tasks for 15 minute intervals in mildly distracting environment with supervision verbal cues for redirection.    Skilled Therapeutic Interventions:  Pt was seen for skilled ST targeting cognitive goals.  Therapist facilitated the session with a medication management task targeting functional problem solving.  Pt initially required min assist verbal cues to familiarize pt with QD pill box as well as for poor task organization.  However, as task progressed therapist was able to fade cues to supervision.  Pt was returned to room and left in wheelchair with call bell within reach.  Continue per current plan of care.    Function:  Eating Eating                 Cognition Comprehension Comprehension assist level: Follows basic conversation/direction with no assist  Expression   Expression assist level: Expresses basic 90% of the time/requires cueing < 10% of the time.  Social Interaction Social Interaction assist level: Interacts appropriately 75 - 89% of the time - Needs redirection for appropriate language or to initiate interaction.   Problem Solving Problem solving assist level: Solves basic 75 - 89% of the time/requires cueing 10 - 24% of the time  Memory Memory assist level: Recognizes or recalls 90% of the time/requires cueing < 10% of the time    Pain Pain Assessment Pain Assessment: No/denies pain  Therapy/Group: Individual Therapy  Gesselle Fitzsimons, Selinda Orion 03/21/2016, 4:50 PM

## 2016-03-21 NOTE — Plan of Care (Signed)
Problem: RH BLADDER ELIMINATION Goal: RH STG MANAGE BLADDER WITH ASSISTANCE STG Manage Bladder With Assistance, Mod I  Outcome: Not Progressing Pt incont

## 2016-03-22 ENCOUNTER — Inpatient Hospital Stay (HOSPITAL_COMMUNITY): Payer: Medicare Other | Admitting: *Deleted

## 2016-03-22 LAB — GLUCOSE, CAPILLARY
GLUCOSE-CAPILLARY: 124 mg/dL — AB (ref 65–99)
GLUCOSE-CAPILLARY: 237 mg/dL — AB (ref 65–99)
GLUCOSE-CAPILLARY: 161 mg/dL — AB (ref 65–99)
Glucose-Capillary: 175 mg/dL — ABNORMAL HIGH (ref 65–99)
Glucose-Capillary: 201 mg/dL — ABNORMAL HIGH (ref 65–99)

## 2016-03-22 NOTE — Progress Notes (Signed)
Danielle Vargas is a 65 y.o. female 07/05/1951 [id number]  Subjective: No new complaints. No new problems. Pain controlled. Feeling OK.  Objective: Vital signs in last 24 hours: Temp:  [97.4 F (36.3 C)-98.2 F (36.8 C)] 98.2 F (36.8 C) (07/15 0436) Pulse Rate:  [78-88] 80 (07/15 0436) Resp:  [14-18] 18 (07/15 0436) BP: (154-175)/(66-78) 175/66 mmHg (07/15 0436) SpO2:  [96 %-98 %] 96 % (07/15 0436) Weight:  [100.2 kg (220 lb 14.4 oz)] 100.2 kg (220 lb 14.4 oz) (07/15 0436) Weight change:  Last BM Date: 03/21/16  Intake/Output from previous day: 07/14 0701 - 07/15 0700 In: 700 [P.O.:700] Out: -   Physical Exam General: No apparent distress   MO Lungs: Normal effort. Lungs clear to auscultation, no crackles or wheezes. Cardiovascular: Regular rate and rhythm, no edema Neurological: No new neurological deficits  Lab Results: BMET    Component Value Date/Time   NA 138 03/17/2016 0525   K 3.9 03/17/2016 0525   CL 110 03/17/2016 0525   CO2 22 03/17/2016 0525   GLUCOSE 187* 03/17/2016 0525   BUN 19 03/17/2016 0525   CREATININE 0.94 03/17/2016 0525   CALCIUM 9.2 03/17/2016 0525   GFRNONAA >60 03/17/2016 0525   GFRAA >60 03/17/2016 0525   CBC    Component Value Date/Time   WBC 6.3 03/17/2016 0525   RBC 4.25 03/17/2016 0525   HGB 11.9* 03/17/2016 0525   HCT 37.3 03/17/2016 0525   PLT 239 03/17/2016 0525   MCV 87.8 03/17/2016 0525   MCH 28.0 03/17/2016 0525   MCHC 31.9 03/17/2016 0525   RDW 16.7* 03/17/2016 0525   LYMPHSABS 2.0 03/17/2016 0525   MONOABS 0.6 03/17/2016 0525   EOSABS 0.3 03/17/2016 0525   BASOSABS 0.1 03/17/2016 0525   CBG's (last 3):   Recent Labs  03/21/16 2123 03/22/16 0014 03/22/16 0656  GLUCAP 236* 175* 124*   LFT's Lab Results  Component Value Date   ALT 26 03/17/2016   AST 27 03/17/2016   ALKPHOS 41 03/17/2016   BILITOT 0.4 03/17/2016    Studies/Results: No results found.  Medications:  I have reviewed the patient's  current medications. Scheduled Medications: . carvedilol  6.25 mg Oral BID WC  . clopidogrel  75 mg Oral Daily  . diclofenac sodium  2 g Topical QID  . docusate sodium  100 mg Oral BID  . fenofibrate  160 mg Oral QHS  . FLUoxetine  10 mg Oral Daily  . gabapentin  300 mg Oral TID  . insulin aspart  0-15 Units Subcutaneous TID WC  . insulin aspart  20 Units Subcutaneous TID WC  . insulin glargine  47 Units Subcutaneous BID  . levothyroxine  100 mcg Oral QAC breakfast  . metoCLOPramide  10 mg Oral QID  . mometasone-formoterol  2 puff Inhalation BID  . montelukast  10 mg Oral QHS  . pantoprazole  40 mg Oral Daily  . polyethylene glycol  17 g Oral QODAY  . rosuvastatin  20 mg Oral Daily  . topiramate  50 mg Oral BID   PRN Medications: acetaminophen **OR** acetaminophen, albuterol, HYDROcodone-acetaminophen, nitroGLYCERIN, ondansetron **OR** ondansetron (ZOFRAN) IV, sorbitol  Assessment/Plan: Active Problems:   Right middle cerebral artery stroke (HCC)   DM type 2 with diabetic peripheral neuropathy (HCC)   Vascular headache  1. 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 Status post loop recorder Cont CIR 2. DVT Prophylaxis/Anticoagulation: SCDs. Monitor for any signs of DVT.  Venous Doppler study negative 3. Pain Management: Neurontin 300 mg 3 times a day, Topamax 50 mg twice a day, hydrocodone as needed, lateral epicondylitis right elbow -rx'd Voltaren gel 7/14 4. Diabetes mellitus of peripheral neuropathy. Hemoglobin A1c 7.3.  Lantus insulin increased to 47 units twice a day 7/13- will monitor NovoLog 15 units 3 times a day, increased to 20U on 7/9. Check blood sugars before meals and at bedtime. Diabetic teaching  CBG (last 3)   Recent Labs (last 2 labs)      Recent Labs  03/21/16 0631 03/21/16 1105 03/21/16 1620  GLUCAP 196* 205* 141*       5. Neuropsych: This patient is capable of making decisions on her own behalf. Fluoxetine started on 7/7 - initially pt refused, then willing to take 6. Skin/Wound Care: Routine skin checks 7. Fluids/Electrolytes/Nutrition: Routine I&O 8. Hypertension. Coreg 6.25 mg twice a day, heart rate is in a good range. Will continue to allow permissive HTN for time being Filed Vitals:   03/21/16 0534 03/21/16 1428  BP: 160/56 154/78  Pulse: 74 88  Temp: 98.6 F (37 C) 97.4 F (36.3 C)  Resp: 18 16   9. Hypothyroidism. Synthroid 10. Hyperlipidemia. Crestor, fenofibrate 11. Asthma. Singulair 10 mg daily, Dulera 2 puffs twice daily, albuterol nebulizer as needed 12. Morbid Obesity. Body mass index is 40.39 kg/(m^2)., Diet and exercise education, cont to encourage weight loss to increase endurance and promote overall health. Dietary follow-up  13. Morganella UTI. S/p 7 day course of Vantin 200 mg every 12 hours times initiated 03/13/2016 14. Obesity hypoventilation syndrome: CPAP      Length of stay, days: 8  Madeeha Costantino A. Asa Lente, MD 03/22/2016, 10:44 AM

## 2016-03-22 NOTE — Progress Notes (Signed)
Physical Therapy Session Note  Patient Details  Name: Danielle Vargas MRN: [id number] Date of Birth: 12-12-1950  Today's Date: 03/22/2016 PT Individual Time: [id number] PT Individual Time Calculation (min): 29 min   Short Term Goals: Week 1:  PT Short Term Goal 1 (Week 1): Patient will performed sit<>stand with mod A from PT PT Short Term Goal 2 (Week 1): Patient will initiate gait training.  PT Short Term Goal 3 (Week 1): Patient will perform bed mobility with mod A from PT.  PT Short Term Goal 4 (Week 1): Patient will perform chair<> bed transfer with mod A.  PT Short Term Goal 5 (Week 1): Patient will perform WC mobility for 153ft with min A.   Skilled Therapeutic Interventions/Progress Updates:  Tx focused on functional mobility training, gait with RW and hall rail, and NMR via forced use, manual facilitation, and multi-modal cues. Pt up in [facility] and fatigued, but agreeable to attempt walking. Daughter present.   Sit<>stands with Min A for steadying/blocking LLE.   Gait attempted with RW and hand orthosis, but unsafe after 2 step sdue to inability to correct balance to R with L knee buckling. Second and third attempts with R hall rail, mirror for feedback and WC follow. Pt able to walk 2x15' with Max A for balance and LLE management. Pt is able to advance LLE, but assist for controlling buckling/genu recurvatum. Distance limited by fatigue.   Stand-step transfer WC > bed with Mod A to control LLE and cues for sequence. Pt sat too far edge of bed and needed assist to scoot safely back.  Min A sit>supine with cues for technique.   NMR in supine for bridging, lower trunk rotations, mass hip/knee flexion/ext and ankle DF/PF x10 each.  Pt left in sidelying, supported on pillows, all needs in reach.       Therapy Documentation Precautions:  Precautions Precautions: Fall Restrictions Weight Bearing Restrictions: No   Pain:none   See Function Navigator for Current Functional  Status.   Therapy/Group: Individual Therapy  Zareth Rippetoe Soundra Pilon, PT, DPT  03/22/2016, 2:18 PM

## 2016-03-22 NOTE — Progress Notes (Signed)
Occupational Therapy Weekly Progress Note  Patient Details  Name: Danielle Vargas MRN: [id number] Date of Birth: [birth year]  Beginning of progress report period: March 15, 2016 End of progress report period: March 22, 2016  Patient has met 4 of 4 short term goals. Pt has progressed from +2 squat pivot transfers to Mod A at squat pivot and stand pivot. Pt seated tolerance has improved with decreased R lateral leans unsupported at EOM. Pt demonstrates proximal motor recovery in LUE and LLE with no functional movement at present. Overall motivation and engagement continues to show improvement.   Patient continues to demonstrate the following deficits: overall decreased endurance, standing tolerance, L hemiplegia, processing, motivation, trunk control, awareness of deficits, safety awareness, and therefore will continue to benefit from skilled OT intervention to enhance overall performance with BADL, iADL and Reduce care partner burden.  Patient progressing toward long term goals..  Continue plan of care.  OT Short Term Goals Week 2:  OT Short Term Goal 1 (Week 2): STG = LTG due to remaining LOS  Skilled Therapeutic Interventions/Progress Updates:      Therapy Documentation Precautions:  Precautions Precautions: Fall Restrictions Weight Bearing Restrictions: No General:   Vital Signs: Therapy Vitals Temp: 98.2 F (36.8 C) Temp Source: Oral Pulse Rate: 80 Resp: 18 BP: (!) 175/66 mmHg Patient Position (if appropriate): Lying Oxygen Therapy SpO2: 96 % O2 Device: Not Delivered Pain: Pain Assessment Pain Assessment: 0-10 Pain Score: Asleep Pain Type: Acute pain Pain Location: Hip Pain Orientation: Left Pain Descriptors / Indicators: Aching Pain Frequency: Intermittent Pain Onset: Gradual Patients Stated Pain Goal: 0 Pain Intervention(s): Medication (See eMAR);Repositioned;Emotional support Multiple Pain Sites: Yes 2nd Pain Site Pain Score: 7 Pain Type: Acute pain Pain  Location: Shoulder Pain Orientation: Left Pain Descriptors / Indicators: Aching Pain Frequency: Intermittent Pain Onset: Gradual Patient's Stated Pain Goal: 0 ADL: ADL ADL Comments: refer to functional navigator Exercises:   Other Treatments:    See Function Navigator for Current Functional Status.   Therapy/Group: Individual Therapy  Dierdre Searles 03/22/2016, 7:17 AM

## 2016-03-23 LAB — GLUCOSE, CAPILLARY
GLUCOSE-CAPILLARY: 134 mg/dL — AB (ref 65–99)
GLUCOSE-CAPILLARY: 133 mg/dL — AB (ref 65–99)
GLUCOSE-CAPILLARY: 194 mg/dL — AB (ref 65–99)
Glucose-Capillary: 232 mg/dL — ABNORMAL HIGH (ref 65–99)
Glucose-Capillary: 242 mg/dL — ABNORMAL HIGH (ref 65–99)

## 2016-03-23 NOTE — Progress Notes (Signed)
Danielle Vargas is a 65 y.o. female Oct 07, 1950 [id number]  Subjective: No new complaints. No new problems. Pain controlled. Feeling OK.  Objective: Vital signs in last 24 hours: Temp:  [98 F (36.7 C)-98.4 F (36.9 C)] 98 F (36.7 C) (07/16 0407) Pulse Rate:  [85-94] 85 (07/16 0407) Resp:  [16-20] 18 (07/16 0407) BP: (107-164)/(65-72) 159/72 mmHg (07/16 0407) SpO2:  [95 %-98 %] 95 % (07/16 0407) Weight change:  Last BM Date: 03/22/16  Intake/Output from previous day: 07/15 0701 - 07/16 0700 In: 120 [P.O.:120] Out: -   Physical Exam General: No apparent distress    Lungs: Normal effort. Lungs clear to auscultation, no crackles or wheezes. Cardiovascular: Regular rate and rhythm, no edema Neurological: No new neurological deficits  Lab Results: BMET    Component Value Date/Time   NA 138 03/17/2016 0525   K 3.9 03/17/2016 0525   CL 110 03/17/2016 0525   CO2 22 03/17/2016 0525   GLUCOSE 187* 03/17/2016 0525   BUN 19 03/17/2016 0525   CREATININE 0.94 03/17/2016 0525   CALCIUM 9.2 03/17/2016 0525   GFRNONAA >60 03/17/2016 0525   GFRAA >60 03/17/2016 0525   CBC    Component Value Date/Time   WBC 6.3 03/17/2016 0525   RBC 4.25 03/17/2016 0525   HGB 11.9* 03/17/2016 0525   HCT 37.3 03/17/2016 0525   PLT 239 03/17/2016 0525   MCV 87.8 03/17/2016 0525   MCH 28.0 03/17/2016 0525   MCHC 31.9 03/17/2016 0525   RDW 16.7* 03/17/2016 0525   LYMPHSABS 2.0 03/17/2016 0525   MONOABS 0.6 03/17/2016 0525   EOSABS 0.3 03/17/2016 0525   BASOSABS 0.1 03/17/2016 0525   CBG's (last 3):    Recent Labs  03/22/16 2106 03/23/16 0651 03/23/16 0745  GLUCAP 161* 134* 133*   LFT's Lab Results  Component Value Date   ALT 26 03/17/2016   AST 27 03/17/2016   ALKPHOS 41 03/17/2016   BILITOT 0.4 03/17/2016    Studies/Results: No results found.  Medications:  I have reviewed the patient's current medications. Scheduled Medications: . carvedilol  6.25 mg Oral BID WC  .  clopidogrel  75 mg Oral Daily  . diclofenac sodium  2 g Topical QID  . docusate sodium  100 mg Oral BID  . fenofibrate  160 mg Oral QHS  . FLUoxetine  10 mg Oral Daily  . gabapentin  300 mg Oral TID  . insulin aspart  0-15 Units Subcutaneous TID WC  . insulin aspart  20 Units Subcutaneous TID WC  . insulin glargine  47 Units Subcutaneous BID  . levothyroxine  100 mcg Oral QAC breakfast  . metoCLOPramide  10 mg Oral QID  . mometasone-formoterol  2 puff Inhalation BID  . montelukast  10 mg Oral QHS  . pantoprazole  40 mg Oral Daily  . polyethylene glycol  17 g Oral QODAY  . rosuvastatin  20 mg Oral Daily  . topiramate  50 mg Oral BID   PRN Medications: acetaminophen **OR** acetaminophen, albuterol, HYDROcodone-acetaminophen, nitroGLYCERIN, ondansetron **OR** ondansetron (ZOFRAN) IV, sorbitol  Assessment/Plan: Active Problems:   Right middle cerebral artery stroke (HCC)   DM type 2 with diabetic peripheral neuropathy (HCC)   Vascular headache  1. 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 Status post loop recorder Cont CIR 2. DVT Prophylaxis/Anticoagulation: SCDs. Monitor for any signs of DVT. Venous Doppler study negative 3. Pain Management: Neurontin 300 mg 3 times a day, Topamax  50 mg twice a day, hydrocodone as needed, lateral epicondylitis right elbow -rx'd Voltaren gel 7/14 4. Diabetes mellitus of peripheral neuropathy. Hemoglobin A1c 7.3.  Lantus insulin increased to 47 units twice a day 7/13- will monitor NovoLog 15 units 3 times a day, increased to 20U on 7/9. Check blood sugars before meals and at bedtime. Diabetic teaching  CBG (last 3)   Recent Labs (last 2 labs)      Recent Labs  03/21/16 0631 03/21/16 1105 03/21/16 1620  GLUCAP 196* 205* 141*      5. Neuropsych: This patient is capable of making decisions on her own  behalf. Fluoxetine started on 7/7 - initially pt refused, then willing to take 6. Skin/Wound Care: Routine skin checks 7. Fluids/Electrolytes/Nutrition: Routine I&O 8. Hypertension. Coreg 6.25 mg twice a day, heart rate is in a good range. Will continue to allow permissive HTN for time being Filed Vitals:   03/21/16 0534 03/21/16 1428  BP: 160/56 154/78  Pulse: 74 88  Temp: 98.6 F (37 C) 97.4 F (36.3 C)  Resp: 18 16   9. Hypothyroidism. Synthroid 10. Hyperlipidemia. Crestor, fenofibrate 11. Asthma. Singulair 10 mg daily, Dulera 2 puffs twice daily, albuterol nebulizer as needed 12. Morbid Obesity. Body mass index is 40.39 kg/(m^2)., Diet and exercise education, cont to encourage weight loss to increase endurance and promote overall health. Dietary follow-up  13. Morganella UTI. S/p 7 day course of Vantin 200 mg every 12 hours times initiated 03/13/2016 14. Obesity hypoventilation syndrome: CPAP      Length of stay, days: 9  Doranne Schmutz A. Asa Lente, MD 03/23/2016, 9:49 AM

## 2016-03-24 ENCOUNTER — Ambulatory Visit: Payer: Medicare Other

## 2016-03-24 ENCOUNTER — Inpatient Hospital Stay (HOSPITAL_COMMUNITY): Payer: Medicare Other | Admitting: Speech Pathology

## 2016-03-24 ENCOUNTER — Inpatient Hospital Stay (HOSPITAL_COMMUNITY): Payer: Medicare Other | Admitting: Occupational Therapy

## 2016-03-24 ENCOUNTER — Inpatient Hospital Stay (HOSPITAL_COMMUNITY): Payer: Medicare Other | Admitting: Physical Therapy

## 2016-03-24 DIAGNOSIS — G8194 Hemiplegia, unspecified affecting left nondominant side: Secondary | ICD-10-CM

## 2016-03-24 LAB — GLUCOSE, CAPILLARY
GLUCOSE-CAPILLARY: 161 mg/dL — AB (ref 65–99)
Glucose-Capillary: 188 mg/dL — ABNORMAL HIGH (ref 65–99)
Glucose-Capillary: 223 mg/dL — ABNORMAL HIGH (ref 65–99)
Glucose-Capillary: 204 mg/dL — ABNORMAL HIGH (ref 65–99)

## 2016-03-24 MED ORDER — INSULIN GLARGINE 100 UNIT/ML ~~LOC~~ SOLN
50.0000 [IU] | Freq: Two times a day (BID) | SUBCUTANEOUS | Status: DC
  Administered 2016-03-24 – 2016-04-02 (×18): 50 [IU] via SUBCUTANEOUS
  Filled 2016-03-24 (×20): qty 0.5

## 2016-03-24 NOTE — Plan of Care (Signed)
Problem: RH BLADDER ELIMINATION Goal: RH STG MANAGE BLADDER WITH ASSISTANCE STG Manage Bladder With Assistance, Mod I  Outcome: Not Progressing Pt incontinent.

## 2016-03-24 NOTE — Progress Notes (Signed)
Speech Language Pathology Weekly Progress and Session Note  Patient Details  Name: Danielle Vargas MRN: [id number] Date of Birth: 11-17-1950  Beginning of progress report period:  March 15, 2016   End of progress report period:  March 24, 2016   Today's Date: 03/24/2016 SLP Individual Time: 1657-9038 SLP Individual Time Calculation (min): 57 min  Short Term Goals: Week 1: SLP Short Term Goal 1 (Week 1): Pt will attend to her left upper extremity during basic functional tasks with supervision verbal cues.   SLP Short Term Goal 1 - Progress (Week 1): Met SLP Short Term Goal 2 (Week 1): Pt will utilize external memory aids to facilitate recall of information with supervision verbal cues.   SLP Short Term Goal 2 - Progress (Week 1): Met SLP Short Term Goal 3 (Week 1): Pt will complete semi-complex familiar tasks with supervision verbal cues for functional problem solving.   SLP Short Term Goal 3 - Progress (Week 1): Met SLP Short Term Goal 4 (Week 1): Pt will return demonstration of at least 2 safety precautions with min assist verbal cues.   SLP Short Term Goal 4 - Progress (Week 1): Met SLP Short Term Goal 5 (Week 1): Pt will selectively attend to semi-complex tasks for 15 minute intervals in mildly distracting environment with supervision verbal cues for redirection.   SLP Short Term Goal 5 - Progress (Week 1): Met    New Short Term Goals: Week 2: SLP Short Term Goal 1 (Week 2): Pt will attend to her left upper extremity during semi-complex functional tasks with supervision verbal cues.   SLP Short Term Goal 2 (Week 2): Pt will utilize external memory aids to facilitate recall of information with mod I verbal cues.   SLP Short Term Goal 3 (Week 2): Pt will complete semi-complex novel tasks with supervision verbal cues for functional problem solving.   SLP Short Term Goal 4 (Week 2): Pt will return demonstration of at least 2 safety precautions with supervision verbal cues.   SLP Short Term  Goal 5 (Week 2): Pt will selectively attend to semi-complex tasks for 15 minute intervals in a moderately distracting environment with supervision verbal cues for redirection.   SLP Short Term Goal 6 (Week 2): Pt will identify 2 deficits s/p CVA and their impact on her functional independence with min assist question cues.    Weekly Progress Updates:  Pt has made slow progress this reporting period and has met 5 out of 5 short term goals.  Pt requires min assist-supervision during basic functional tasks due to poor intellectual/emergent awareness of deficits, decreased recall of new information, left inattention, decreased functional problem solving, and decreased selective attention to tasks.  Pt was independent prior to admission and has experienced a loss in function s/p CVA.  As a result, pt would continue to benefit from skilled ST while inpatient in order to maximize functional independence and reduce burden of care prior to discharge.  Recommend 24/7 supervision at discharge in addition to [place] follow up at next level of care.  Pt currently does not have 24/7 supervision available at this time.  Pt and family education is ongoing.     Intensity: Minumum of 1-2 x/day, 30 to 90 minutes Frequency: 3 to 5 out of 7 days Duration/Length of Stay: 21-24 days  Treatment/Interventions: Cognitive remediation/compensation;Cueing hierarchy;Functional tasks;Environmental controls;Internal/external aids   Daily Session  Skilled Therapeutic Interventions: Pt was seen for skilled ST targeting cognitive goals.  Therapist facilitated the session with a deductive  reasoning puzzle targeting functional problem solving.   Pt required x2 min assist verbal cues for mental flexibility during the abovementioned tasks but was otherwise supervision level assist for functional problem solving.  Pt with questions about return to driving.  Discussed that MD will have to clear pt to drive.  Pt required mod assist question cues  to identify how her current physical and cognitive limitations will impact her ability to drive.  SLP also discussed team recommendations that pt have 24/7 supervision at discharge due to ongoing deficits.  Pt does not have 24/7 supervision available to her at this time but was reluctant to consider SNF placement as another option.  Will discuss with CSW further.  Goals updated on this date to reflect current progress and plan of care.     Function:   Eating Eating                 Cognition Comprehension Comprehension assist level: Follows basic conversation/direction with extra time/assistive device  Expression   Expression assist level: Expresses basic needs/ideas: With extra time/assistive device  Social Interaction Social Interaction assist level: Interacts appropriately 75 - 89% of the time - Needs redirection for appropriate language or to initiate interaction.  Problem Solving Problem solving assist level: Solves basic 75 - 89% of the time/requires cueing 10 - 24% of the time  Memory Memory assist level: Recognizes or recalls 90% of the time/requires cueing < 10% of the time   General    Pain Pain Assessment Pain Assessment: 0-10 Pain Score: 6  Pain Type: Acute pain Pain Location: Hip Pain Orientation: Left Pain Descriptors / Indicators: Aching Pain Intervention(s): RN made aware;Repositioned  Therapy/Group: Individual Therapy  Akiem Urieta, Selinda Orion 03/24/2016, 12:42 PM

## 2016-03-24 NOTE — Progress Notes (Signed)
Patient refusing the use of CPAP tonight. RT informed patient if she changes her mind have RN contact RT. RN aware.

## 2016-03-24 NOTE — Progress Notes (Signed)
Physical Therapy Weekly Progress Note  Patient Details  Name: Danielle Vargas MRN: [id number] Date of Birth: [birth year]  Beginning of progress report period: March 15, 2016 End of progress report period: March 24, 2016  Today's Date: 03/24/2016 PT Individual Time: 1300-1415 PT Individual Time Calculation (min): 75 min   Patient has met 4 of 5 short term goals.  W/c propulsion goal unmet as w/c propulsion has not been a major focus of sessions as pt has returning LLE function in mobility tasks. Pt currently requires modA for bed mobility d/t body habitus, minA sit <>stand, modA for gait with RW for LLE progression and stance control to prevent buckling/recurvatum. Pt with improving motivation, awareness, participation in sessions.   Patient continues to demonstrate the following deficits:  activity tolerance, balance, postural control, ability to compensate for deficits, functional use of  left upper extremity and left lower extremity, attention, awareness and coordination and therefore will continue to benefit from skilled PT intervention to enhance overall performance with bed mobility, transfers, gait, home and community access.  Patient progressing toward long term goals..  Continue plan of care.  PT Short Term Goals Week 1:  PT Short Term Goal 1 (Week 1): Patient will performed sit<>stand with mod A from PT PT Short Term Goal 1 - Progress (Week 1): Met PT Short Term Goal 2 (Week 1): Patient will initiate gait training.  PT Short Term Goal 2 - Progress (Week 1): Met PT Short Term Goal 3 (Week 1): Patient will perform bed mobility with mod A from PT.  PT Short Term Goal 3 - Progress (Week 1): Met PT Short Term Goal 4 (Week 1): Patient will perform chair<> bed transfer with mod A.  PT Short Term Goal 4 - Progress (Week 1): Met PT Short Term Goal 5 (Week 1): Patient will perform WC mobility for 134f with min A.  PT Short Term Goal 5 - Progress (Week 1): Not met Week 2:  PT Short Term Goal 1  (Week 2): Pt will perform sit <>stand with min guard  PT Short Term Goal 2 (Week 2): Pt will perform bed mobility from flat bed with minA PT Short Term Goal 3 (Week 2): Pt will perform stand pivot transfer with minA and LRAD PT Short Term Goal 4 (Week 2): Pt will ambulate 53 with LRAD and modA PT Short Term Goal 5 (Week 2): Pt will initate stair training  Skilled Therapeutic Interventions/Progress Updates:    Pt received seated in w/c, denies pain and agreeable to treatment. Gait x25' with RW initially, changed to R rail due to poor management of RW and increased assist needed for LLE progression. Second trial x10' with LLE PLS AFO, and improved LLE progression with assist needed to facilitate R weight shift, and progression in approximately 20% of steps. Pt fatigues very quickly. Stairs ascent x8 3-inch height with R rail maxA, pt very fatigued by the time she got to top of steps and required assist to remain standing, and then chair placed for pt to have rest break. Descent down 8 3-inch stairs with maxA. Stand pivot transfer x2 w/c <>mat table with modA and pt impulsively sitting prior to completing turn on second attempt. Scooting R/L on mat table with cues for anterior weight shift to reduce weight to scoot and improve activation of lateral trunk. W/c propulsion x50' with minA to A with RLE steering. Returned remaining distance to room totalA, remained seated in w/c with all needs in reach at completion of session.  Therapy Documentation Precautions:  Precautions Precautions: Fall Restrictions Weight Bearing Restrictions: No Pain: Pain Assessment Pain Assessment: 0-10 Pain Score: 2  Pain Type: Acute pain Pain Location: Hip Pain Orientation: Left Pain Descriptors / Indicators: Aching Pain Intervention(s): RN made aware;Repositioned   See Function Navigator for Current Functional Status.  Therapy/Group: Individual Therapy  Luberta Mutter 03/24/2016, 3:45 PM

## 2016-03-24 NOTE — Progress Notes (Signed)
[facility]     PROGRESS NOTE  Subjective/Complaints:  Elbow pain improved No new issues  ROS: Denies CP, SOB, N/V/D.  Objective: Vital Signs: Blood pressure 157/60, pulse 78, temperature 97.6 F (36.4 C), temperature source Oral, resp. rate 18, height 5\' 1"  (1.549 m), weight 99.4 kg (219 lb 2.2 oz), SpO2 97 %. No results found. No results for input(s): WBC, HGB, HCT, PLT in the last 72 hours. No results for input(s): NA, K, CL, GLUCOSE, BUN, CREATININE, CALCIUM in the last 72 hours.  Invalid input(s): CO CBG (last 3)   Recent Labs  03/23/16 1633 03/23/16 2046 03/24/16 0651  GLUCAP 232* 242* 161*    Wt Readings from Last 3 Encounters:  03/24/16 99.4 kg (219 lb 2.2 oz)  03/13/16 96.9 kg (213 lb 10 oz)  03/04/16 100.245 kg (221 lb)    Physical Exam:  BP 157/60 mmHg  Pulse 78  Temp(Src) 97.6 F (36.4 C) (Oral)  Resp 18  Ht 5\' 1"  (1.549 m)  Wt 99.4 kg (219 lb 2.2 oz)  BMI 41.43 kg/m2  SpO2 97% Constitutional: She appears well-developed and well-nourished.  HENT: Normocephalic and atraumatic.  Cardiovascular: Normal rate and regular rhythm.  Respiratory: Effort normal and breath sounds normal. No respiratory distress.  GI: Soft. Bowel sounds are normal. She exhibits no distension.  Musculoskeletal: She exhibits no edema or tenderness.  Neurological: She is alert and oriented.  Follow commands.  Left facial weakness. Motor: RUE: 5/5 proximal to distal RLE: Hip flexion 4+/5, knee extension 4/5, ankle dorsi/plantar flexion 5/5 LUE: 0/5  LLE: able to wiggle toes, otherwise 0/5 Skin: Skin is warm and dry.  Psychiatric:  Mood, affect,brighter extremities show no evidence of edema and no pain with range of motion of the toes of either foot. No erythema Assessment/Plan: 1. 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 Physiatrist is providing close team supervision and 24 hour management of active medical problems listed below. Physiatrist and rehab team continue to assess barriers to discharge/monitor patient progress toward functional and medical goals.  Function:  Bathing Bathing position   Position: Shower  Bathing parts Body parts bathed by patient: Chest, Abdomen Body parts bathed by helper: Right arm, Left arm, Right upper leg, Left upper leg, Right lower leg, Left lower leg, Back  Bathing assist Assist Level:  (Max A)      Upper Body Dressing/Undressing Upper body dressing   What is the patient wearing?: Pull over shirt/dress     Pull over shirt/dress - Perfomed by patient: Put head through opening, Thread/unthread right sleeve Pull over shirt/dress - Perfomed by helper: Thread/unthread left sleeve, Pull shirt over trunk        Upper body assist Assist Level:  (Mod A due to compressed time)      Lower Body Dressing/Undressing Lower body dressing   What is the patient wearing?: Pants, Non-skid slipper socks       Pants- Performed by helper: Thread/unthread right pants leg, Thread/unthread left pants leg, Pull pants up/down   Non-skid slipper socks- Performed by helper: Don/doff right sock, Don/doff left sock                  Lower body assist Assist for lower body dressing:  (Max A)      Toileting Toileting   Toileting steps completed by patient: Performs perineal  hygiene Toileting steps completed by helper: Adjust clothing prior to toileting, Performs perineal hygiene, Adjust clothing after toileting Toileting Assistive Devices: Grab bar or rail  Toileting assist Assist level: Two helpers   Transfers Chair/bed transfer   Chair/bed transfer method: Stand pivot Chair/bed transfer assist level: Moderate assist (Pt 50 - 74%/lift or lower) Chair/bed transfer assistive device: Armrests Mechanical lift: Stedy    Locomotion Ambulation Ambulation activity did not occur: Refused   Max distance: 15 Assist level: 2 helpers   Wheelchair   Type: Manual Max wheelchair distance: 50 Assist Level: Maximal assistance (Pt 25 - 49%)  Cognition Comprehension Comprehension assist level: Follows basic conversation/direction with extra time/assistive device  Expression Expression assist level: Expresses basic needs/ideas: With extra time/assistive device  Social Interaction Social Interaction assist level: Interacts appropriately 75 - 89% of the time - Needs redirection for appropriate language or to initiate interaction.  Problem Solving Problem solving assist level: Solves basic 75 - 89% of the time/requires cueing 10 - 24% of the time  Memory Memory assist level: Recognizes or recalls 90% of the time/requires cueing < 10% of the time    Medical Problem List and Plan: 1. 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 Status post loop recorder  Cont CIR, PT, OT, speech therapy  Splints ordered, pt has not received  Fluoxetine started on 7/7, but pt refused, now, willing to take 2. DVT Prophylaxis/Anticoagulation: SCDs. Monitor for any signs of DVT. Venous Doppler study negative 3. Pain Management: Neurontin 300 mg 3 times a day, Topamax 50 mg twice a day, hydrocodone as needed, lateral epicondylitis right elbow,Voltaren gel 4. Diabetes mellitus of peripheral neuropathy. Hemoglobin A1c 7.3.   Lantus insulin increased to47 units twice a day on 7/13-still elevated increase to 50U BID  NovoLog 15 units 3 times a day, increased to 20U on 7/9.  Check blood sugars before meals and at bedtime. Diabetic teaching   CBG (last 3)   Recent Labs  03/23/16 1633 03/23/16 2046 03/24/16 0651  GLUCAP 232* 242* 161*    5. Neuropsych: This patient is capable of making decisions on her own behalf. 6. Skin/Wound Care: Routine skin checks 7.  Fluids/Electrolytes/Nutrition: Routine I&O 8. Hypertension. Coreg 6.25 mg twice a day, heart rate is in a good range.  SBP trending down  Filed Vitals:   03/23/16 1348 03/24/16 0349  BP: 123/63 157/60  Pulse: 89 78  Temp: 97.5 F (36.4 C) 97.6 F (36.4 C)  Resp: 18 18   9. Hypothyroidism. Synthroid 10. Hyperlipidemia. Crestor, fenofibrate 11. Asthma. Singulair 10 mg daily, Dulera 2 puffs twice daily, albuterol nebulizer as needed 12. Morbid Obesity. Body mass index is 40.39 kg/(m^2)., Diet and exercise education, cont to encourage weight loss to increase endurance and promote overall health. Dietary follow-up  13. Morganella UTI. 7 day course of Vantin 200 mg every 12 hours times initiated 03/13/2016, completed 14. Obesity hypoventilation syndrome: CPAP  LOS (Days) 10 A FACE TO FACE EVALUATION WAS PERFORMED  Charlett Blake 03/24/2016 10:51 AM

## 2016-03-24 NOTE — Progress Notes (Signed)
Occupational Therapy Session Note  Patient Details  Name: Danielle Vargas MRN: [id number] Date of Birth: April 02, 1951  Today's Date: 03/24/2016 OT Individual Time: [id number] OT Individual Time Calculation (min): 60 min    Short Term Goals: Week 2:  OT Short Term Goal 1 (Week 2): STG = LTG due to remaining LOS  Skilled Therapeutic Interventions/Progress Updates:    Pt reported and demonstrated return of function in LUE and LLE. Some shoulder abduction observed and hip flexion / knee extension noted. Pt reports feeling high level of motivation for therapy today and excited to take shower.   Treatment session with focus on self care skills. Pt sit to stand EOB <> STEDY before and after shower with close CGA while pt pulled up on bar. Transfer shower bench <> STEDY also with close CGA for tactile facilitation for weight shifting. Pt completed showering tasks with Max A for UB and LB showering tasks due to body habitus. Pt may benefit from AE to increase independence. Pt transferred to bed to complete drying and to work on bed mobility for decreased caregiver burden.   UB dressing completed at EOB to address sitting balance. Noted pt demonstrates improved postural control with intermittent posterior postural lean. Pt completed oral hygiene with education provided regarding ways to problem solve use of LUE as stabilizer such as when opening toothpaste.   Bed mobility completed with Max A for weight shifting, LE placement, and verbal cues for sequencing. Pt demonstrating improved RUE strength during mobility tasks overall. Continue to address in sessions.   Pt transferred from EOB > WC to complete oral hygiene seated at sink with Set Up assist provided.   Pt left with all needs in reach.   Therapy Documentation Precautions:  Precautions Precautions: Fall Restrictions Weight Bearing Restrictions: No General:   Vital Signs: Oxygen Therapy SpO2: 97 % O2 Device: Not Delivered Pain: Pt with  no c/o pain this session.   ADL: ADL ADL Comments: refer to functional navigator Exercises:   Other Treatments:    See Function Navigator for Current Functional Status.   Therapy/Group: Individual Therapy  Dierdre Searles 03/24/2016, 9:37 AM

## 2016-03-25 ENCOUNTER — Inpatient Hospital Stay (HOSPITAL_COMMUNITY): Payer: Medicare Other | Admitting: Occupational Therapy

## 2016-03-25 ENCOUNTER — Inpatient Hospital Stay (HOSPITAL_COMMUNITY): Payer: Medicare Other | Admitting: Physical Therapy

## 2016-03-25 ENCOUNTER — Encounter: Payer: Self-pay | Admitting: Neurology

## 2016-03-25 ENCOUNTER — Inpatient Hospital Stay (HOSPITAL_COMMUNITY): Payer: Medicare Other | Admitting: Speech Pathology

## 2016-03-25 LAB — GLUCOSE, CAPILLARY
GLUCOSE-CAPILLARY: 124 mg/dL — AB (ref 65–99)
GLUCOSE-CAPILLARY: 244 mg/dL — AB (ref 65–99)
Glucose-Capillary: 117 mg/dL — ABNORMAL HIGH (ref 65–99)
Glucose-Capillary: 184 mg/dL — ABNORMAL HIGH (ref 65–99)

## 2016-03-25 NOTE — Progress Notes (Signed)
Occupational Therapy Session Note  Patient Details  Name: Danielle Vargas MRN: [id number] Date of Birth: 03/20/1951  Today's Date: 03/25/2016 OT Individual Time: [id number] and 1300-1330 OT Individual Time Calculation (min): 60 min and 30 min     Short Term Goals: Week 2:  OT Short Term Goal 1 (Week 2): STG = LTG due to remaining LOS  Skilled Therapeutic Interventions/Progress Updates:    1 of 2) Treatment session with focus on self care skills, functional transfers, and bed mobility. Pt received in [facility] awake and alert, agreeable to therapy.   Pt completed oral hygiene seated in WC at sink. Verbal cues given for problem solving use of LUE as stabilizer. Pt verbalized agreement and demonstrated follow through. Verbal cues also given to pt for decreased clinician cuing from pt for assist in tasks such as turning off water in order to improve pt independence and encourage engagement of postural muscles. Pt nodded in understanding.   Sit to stand transfers from [facility] <> bed and WC <> toilet with Min A for weight shifting. Bed mobility at Mod A for weight shifting with pt demonstrating improved RUE strength during rolling tasks using bed rails. Verbal cues given for sequencing to coordinate UB and LB movements for increased pt independence and reduced caregiver burden.   UB dressing completed with pt seated at EOB with noted R lateral lean. Pt attributed R lateral lean to dynamic surface of bed mattress. Engaged pt in collaborative problem solving for LB dressing tasks. Plan to attempt use of reacher in future session for improved independence threading BLEs seated at EOB with pt pulling up R side of trousers in bed.   Left pt in [facility] of room with all needs in reach.   2 of 2) Treatment session with focus on self care skills and bed mobility. Pt received from RN during incontinence brief change in STEDY. Pt transferred from STEDY to EOB to problem solve LB dressing strategies using reacher. Verbal cues  given for reacher placement to facilitate pulling up pants. Pt acknowledged cues and demonstrated follow through with attempts but unsuccessful.  Pt requested to be transferred to bed due to having reached her limit for therapy for the day. Pt in bed with alarm set and all needs in reach.   Therapy Documentation Precautions:  Precautions Precautions: Fall Restrictions Weight Bearing Restrictions: No General:   Vital Signs:   Pain: Pain Assessment Pain Assessment: 0-10 Pain Score: 6  Pain Type: Acute pain Pain Location: Arm Pain Orientation: Left Pain Descriptors / Indicators: Aching Pain Frequency: Intermittent Pain Onset: On-going Patients Stated Pain Goal: 2 Pain Intervention(s): Relaxation;Rest;Repositioned ADL: ADL ADL Comments: refer to functional navigator Exercises:   Other Treatments:    See Function Navigator for Current Functional Status.   Therapy/Group: Individual Therapy  Dierdre Searles 03/25/2016, 9:41 AM

## 2016-03-25 NOTE — Progress Notes (Signed)
Speech Language Pathology Daily Session Note  Patient Details  Name: Danielle Vargas MRN: [id number] Date of Birth: 1951/08/30  Today's Date: 03/25/2016 SLP Individual Time: 1100-1155 SLP Individual Time Calculation (min): 55 min  Short Term Goals: Week 2: SLP Short Term Goal 1 (Week 2): Pt will attend to her left upper extremity during semi-complex functional tasks with supervision verbal cues.   SLP Short Term Goal 2 (Week 2): Pt will utilize external memory aids to facilitate recall of information with mod I verbal cues.   SLP Short Term Goal 3 (Week 2): Pt will complete semi-complex novel tasks with supervision verbal cues for functional problem solving.   SLP Short Term Goal 4 (Week 2): Pt will return demonstration of at least 2 safety precautions with supervision verbal cues.   SLP Short Term Goal 5 (Week 2): Pt will selectively attend to semi-complex tasks for 15 minute intervals in a moderately distracting environment with supervision verbal cues for redirection.   SLP Short Term Goal 6 (Week 2): Pt will identify 2 deficits s/p CVA and their impact on her functional independence with min assist question cues.    Skilled Therapeutic Interventions:  Pt was seen for skilled ST targeting cognitive goals.  SLP facilitated the session with a semi-complex scheduling task targeting functional problem solving.  Pt was able to to complete the abovementioned task for 100% accuracy with mod I.  SLP increased task challenge by incorporating distractions during a semi-complex deductive reasoning puzzle to address selective attention as well as functional problem solving.  With distraction, pt required up to min assist verbal cues to complete the abovementioned task for 100% accuracy due to decreased mental flexibility.  Pt reported that prior to CVA she would not have had difficulty attending to a task in the presence of distraction; however, today she reported that incorporation of distractions during  tasks was challenging.  Pt was left with call bell within reach and quick release belt donned for safety. Continue per current plan of care.    Function:  Eating Eating   Modified Consistency Diet: No Eating Assist Level: More than reasonable amount of time;Set up assist for   Eating Set Up Assist For: Cutting food;Opening containers       Cognition Comprehension Comprehension assist level: Follows basic conversation/direction with extra time/assistive device  Expression   Expression assist level: Expresses basic needs/ideas: With no assist  Social Interaction Social Interaction assist level: Interacts appropriately 90% of the time - Needs monitoring or encouragement for participation or interaction.  Problem Solving Problem solving assist level: Solves basic 90% of the time/requires cueing < 10% of the time  Memory Memory assist level: Recognizes or recalls 90% of the time/requires cueing < 10% of the time    Pain Pain Assessment Pain Assessment: No/denies pain   Therapy/Group: Individual Therapy  Brittan Mapel, Selinda Orion 03/25/2016, 12:50 PM

## 2016-03-25 NOTE — Progress Notes (Signed)
Patient refusing the use of CPAP tonight. Rt informed patient if she changes her mind have RN contact RT. RN aware.

## 2016-03-25 NOTE — Progress Notes (Signed)
Physical Therapy Session Note  Patient Details  Name: Danielle Vargas MRN: [id number] Date of Birth: August 17, 1951  Today's Date: 03/25/2016 PT Individual Time: 1000-1100 PT Individual Time Calculation (min): 60 min   Short Term Goals: Week 2:  PT Short Term Goal 1 (Week 2): Pt will perform sit <>stand with min guard  PT Short Term Goal 2 (Week 2): Pt will perform bed mobility from flat bed with minA PT Short Term Goal 3 (Week 2): Pt will perform stand pivot transfer with minA and LRAD PT Short Term Goal 4 (Week 2): Pt will ambulate 86' with LRAD and modA PT Short Term Goal 5 (Week 2): Pt will initate stair training  Skilled Therapeutic Interventions/Progress Updates:    Pt received seated on toilet; c/o pain as below and agreeable to treatment. Sit <>stand x2 from toilet with Charlaine Dalton and S while therapist performed hygiene and clothing management totalA. Gait x10' and x15' with minA overall, LLE PLS AFO and improving R weight shift to allow LLE clearance. Stand pivot transfer w/c <>mat table with RW first trial, HW second trial with poor LLE clearance and pt sitting before completing full turn to chair. Sit <>stand with HW and R lateral reaching for objects to improve R lateral weight shift for carryover into gait. Returned to room totalA; remained seated in w/c at completion of session, all needs in reach.   Therapy Documentation Precautions:  Precautions Precautions: Fall Restrictions Weight Bearing Restrictions: No Pain: Pain Assessment Pain Assessment: 0-10 Pain Score: 6  Pain Type: Acute pain Pain Location: Arm Pain Orientation: Left Pain Descriptors / Indicators: Aching Pain Frequency: Intermittent Pain Onset: On-going Patients Stated Pain Goal: 2 Pain Intervention(s): Relaxation;Rest;Repositioned   See Function Navigator for Current Functional Status.   Therapy/Group: Individual Therapy  Luberta Mutter 03/25/2016, 11:01 AM

## 2016-03-25 NOTE — Progress Notes (Signed)
[facility] PHYSICAL MEDICINE & REHABILITATION     PROGRESS NOTE  Subjective/Complaints:  Elbow pain improved No new issues  ROS: Denies CP, SOB, N/V/D.  Objective: Vital Signs: Blood pressure 125/60, pulse 76, temperature 97.8 F (36.6 C), temperature source Oral, resp. rate 18, height 5\' 1"  (1.549 m), weight 99.4 kg (219 lb 2.2 oz), SpO2 96 %. No results found. No results for input(s): WBC, HGB, HCT, PLT in the last 72 hours. No results for input(s): NA, K, CL, GLUCOSE, BUN, CREATININE, CALCIUM in the last 72 hours.  Invalid input(s): CO CBG (last 3)   Recent Labs  03/24/16 1642 03/24/16 2031 03/25/16 0626  GLUCAP 223* 204* 117*    Wt Readings from Last 3 Encounters:  03/24/16 99.4 kg (219 lb 2.2 oz)  03/13/16 96.9 kg (213 lb 10 oz)  03/04/16 100.245 kg (221 lb)    Physical Exam:  BP 125/60 mmHg  Pulse 76  Temp(Src) 97.8 F (36.6 C) (Oral)  Resp 18  Ht 5\' 1"  (1.549 m)  Wt 99.4 kg (219 lb 2.2 oz)  BMI 41.43 kg/m2  SpO2 96% Constitutional: She appears well-developed and well-nourished.  HENT: Normocephalic and atraumatic.  Cardiovascular: Normal rate and regular rhythm.  Respiratory: Effort normal and breath sounds normal. No respiratory distress.  GI: Soft. Bowel sounds are normal. She exhibits no distension.  Musculoskeletal: She exhibits no edema or tenderness.  Neurological: She is alert and oriented.  Follow commands.  Left facial weakness. Motor: RUE: 5/5 proximal to distal RLE: Hip flexion 4+/5, knee extension 4/5, ankle dorsi/plantar flexion 5/5 LUE: 0/5  LLE: able to wiggle toes, otherwise 0/5 Skin: Skin is warm and dry.  Psychiatric:  Mood, affect,brighter extremities show no evidence of edema and no pain with range of motion of the toes of either foot. No erythema Assessment/Plan: 1. 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 Physiatrist is providing close team supervision and 24 hour management of active medical problems listed below. Physiatrist and rehab team continue to assess barriers to discharge/monitor patient progress toward functional and medical goals.  Function:  Bathing Bathing position   Position: Shower  Bathing parts Body parts bathed by patient: Chest, Abdomen Body parts bathed by helper: Right arm, Left arm, Right upper leg, Left upper leg, Right lower leg, Left lower leg, Back  Bathing assist Assist Level:  (Max A)      Upper Body Dressing/Undressing Upper body dressing   What is the patient wearing?: Pull over shirt/dress     Pull over shirt/dress - Perfomed by patient: Put head through opening, Thread/unthread right sleeve Pull over shirt/dress - Perfomed by helper: Thread/unthread left sleeve, Pull shirt over trunk        Upper body assist Assist Level:  (Mod A due to compressed time)      Lower Body Dressing/Undressing Lower body dressing   What is the patient wearing?: Pants, Non-skid slipper socks       Pants- Performed by helper: Thread/unthread right pants leg, Thread/unthread left pants leg, Pull pants up/down   Non-skid slipper socks- Performed by helper: Don/doff right sock, Don/doff left sock                  Lower body assist Assist for lower body dressing:  (Total A)      Toileting Toileting   Toileting steps completed by patient: Performs perineal  hygiene Toileting steps completed by helper: Adjust clothing prior to toileting, Performs perineal hygiene, Adjust clothing after toileting Toileting Assistive Devices: Grab bar or rail  Toileting assist Assist level: Two helpers   Transfers Chair/bed transfer   Chair/bed transfer method: Stand pivot Chair/bed transfer assist level: Moderate assist (Pt 50 - 74%/lift or lower) Chair/bed transfer assistive device: Armrests, Walker Mechanical lift:  Architectural technologist activity did not occur: Refused   Max distance: 25 Assist level: 2 helpers (modA, w/c follow for safety)   Wheelchair   Type: Manual Max wheelchair distance: 50 Assist Level: Touching or steadying assistance (Pt > 75%)  Cognition Comprehension Comprehension assist level: Follows basic conversation/direction with extra time/assistive device  Expression Expression assist level: Expresses basic needs/ideas: With extra time/assistive device  Social Interaction Social Interaction assist level: Interacts appropriately 75 - 89% of the time - Needs redirection for appropriate language or to initiate interaction.  Problem Solving Problem solving assist level: Solves basic 75 - 89% of the time/requires cueing 10 - 24% of the time  Memory Memory assist level: Recognizes or recalls 90% of the time/requires cueing < 10% of the time    Medical Problem List and Plan: 1. 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 Status post loop recorder  Cont CIR, PT, OT, speech therapy, Team conference in a.m.  Splints ordered, pt has not received  Fluoxetine started on 7/7, but pt refused, now, willing to take 2. DVT Prophylaxis/Anticoagulation: SCDs. Monitor for any signs of DVT. Venous Doppler study negative 3. Pain Management: Neurontin 300 mg 3 times a day, Topamax 50 mg twice a day, hydrocodone as needed, lateral epicondylitis right elbow,Voltaren gel 4. Diabetes mellitus of peripheral neuropathy. Hemoglobin A1c 7.3.   Lantus insulin increased to47 units twice a day on 7/13-, 7/17 increase to 50U BID, improved a.m. CBG  NovoLog 15 units 3 times a day, increased to 20U on 7/9.  Check blood sugars before meals and at bedtime. Diabetic teaching   CBG (last 3)   Recent Labs  03/24/16 1642 03/24/16 2031 03/25/16 0626  GLUCAP 223* 204* 117*    5. Neuropsych: This patient is capable of making  decisions on her own behalf. 6. Skin/Wound Care: Routine skin checks 7. Fluids/Electrolytes/Nutrition: Routine I&O 8. Hypertension. Coreg 6.25 mg twice a day, heart rate is in a good range.  SBP trending down  Filed Vitals:   03/24/16 2225 03/25/16 0458  BP:  125/60  Pulse: 85 76  Temp:  97.8 F (36.6 C)  Resp: 18 18   9. Hypothyroidism. Synthroid 10. Hyperlipidemia. Crestor, fenofibrate 11. Asthma. Singulair 10 mg daily, Dulera 2 puffs twice daily, albuterol nebulizer as needed 12. Morbid Obesity. Body mass index is 40.39 kg/(m^2)., Diet and exercise education, cont to encourage weight loss to increase endurance and promote overall health. Dietary follow-up  13. Morganella UTI. 7 day course of Vantin 200 mg every 12 hours times initiated 03/13/2016, completed 14. Obesity hypoventilation syndrome: CPAP 15. Psychosocial, states that daughter works during the week, prefers, weekend discharge. LOS (Days) 11 A FACE TO FACE EVALUATION WAS PERFORMED  KIRSTEINS,ANDREW E 03/25/2016 9:01 AM

## 2016-03-26 ENCOUNTER — Inpatient Hospital Stay (HOSPITAL_COMMUNITY): Payer: Medicare Other | Admitting: Physical Therapy

## 2016-03-26 ENCOUNTER — Encounter: Payer: Self-pay | Admitting: *Deleted

## 2016-03-26 ENCOUNTER — Inpatient Hospital Stay (HOSPITAL_COMMUNITY): Payer: Medicare Other | Admitting: Speech Pathology

## 2016-03-26 ENCOUNTER — Inpatient Hospital Stay (HOSPITAL_COMMUNITY): Payer: Medicare Other | Admitting: Occupational Therapy

## 2016-03-26 LAB — GLUCOSE, CAPILLARY
Glucose-Capillary: 154 mg/dL — ABNORMAL HIGH (ref 65–99)
Glucose-Capillary: 181 mg/dL — ABNORMAL HIGH (ref 65–99)
Glucose-Capillary: 248 mg/dL — ABNORMAL HIGH (ref 65–99)
Glucose-Capillary: 165 mg/dL — ABNORMAL HIGH (ref 65–99)

## 2016-03-26 MED ORDER — TIZANIDINE HCL 2 MG PO TABS
2.0000 mg | ORAL_TABLET | Freq: Three times a day (TID) | ORAL | Status: DC | PRN
  Administered 2016-03-26 – 2016-04-01 (×10): 2 mg via ORAL
  Filled 2016-03-26 (×11): qty 1

## 2016-03-26 NOTE — Progress Notes (Signed)
[facility] PHYSICAL MEDICINE & REHABILITATION     PROGRESS NOTE  Subjective/Complaints:  Patient complains of cramps in her left foot at night. She did not have these before her stroke. She has a history of taking Zanaflex as well as Flexeril in the past, but this is mainly for back spasms and neck spasms. No recent foot injury  ROS: Denies CP, SOB, N/V/D.  Objective: Vital Signs: Blood pressure 178/65, pulse 76, temperature 97.7 F (36.5 C), temperature source Oral, resp. rate 18, height 5' 1"  (1.549 m), weight 99.4 kg (219 lb 2.2 oz), SpO2 97 %. No results found. No results for input(s): WBC, HGB, HCT, PLT in the last 72 hours. No results for input(s): NA, K, CL, GLUCOSE, BUN, CREATININE, CALCIUM in the last 72 hours.  Invalid input(s): CO CBG (last 3)   Recent Labs  03/25/16 1643 03/25/16 2137 03/26/16 0652  GLUCAP 124* 244* 154*    Wt Readings from Last 3 Encounters:  03/24/16 99.4 kg (219 lb 2.2 oz)  03/13/16 96.9 kg (213 lb 10 oz)  03/04/16 100.245 kg (221 lb)    Physical Exam:  BP 178/65 mmHg  Pulse 76  Temp(Src) 97.7 F (36.5 C) (Oral)  Resp 18  Ht 5' 1"  (1.549 m)  Wt 99.4 kg (219 lb 2.2 oz)  BMI 41.43 kg/m2  SpO2 97% Constitutional: She appears well-developed and well-nourished.  HENT: Normocephalic and atraumatic.  Cardiovascular: Normal rate and regular rhythm.  Respiratory: Effort normal and breath sounds normal. No respiratory distress.  GI: Soft. Bowel sounds are normal. She exhibits no distension.  Musculoskeletal: She exhibits no edema or tenderness. No pain with range of motion of the foot or ankle on the left side.  Neurological: She is alert and oriented. No evidence of clonus at the left ankle  Follow commands.  Left facial weakness. Motor: RUE: 5/5 proximal to distal RLE: Hip flexion 4+/5, knee extension 4/5, ankle dorsi/plantar flexion 5/5 LUE: 0/5  LLE: able to wiggle toes, otherwise 0/5 Skin: Skin is warm and dry.  Psychiatric:   Mood, affect,brighter extremities show no evidence of edema and no pain with range of motion of the toes of either foot. No erythema Assessment/Plan: 1. 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 Physiatrist is providing close team supervision and 24 hour management of active medical problems listed below. Physiatrist and rehab team continue to assess barriers to discharge/monitor patient progress toward functional and medical goals.  Function:  Bathing Bathing position   Position: Shower  Bathing parts Body parts bathed by patient: Chest, Abdomen Body parts bathed by helper: Right arm, Left arm, Right upper leg, Left upper leg, Right lower leg, Left lower leg, Back  Bathing assist Assist Level:  (Max A)      Upper Body Dressing/Undressing Upper body dressing   What is the patient wearing?: Pull over shirt/dress     Pull over shirt/dress - Perfomed by patient: Thread/unthread right sleeve, Put head through opening, Pull shirt over trunk Pull over shirt/dress - Perfomed by helper: Thread/unthread left sleeve        Upper body assist Assist Level: Touching or steadying assistance(Pt > 75%)      Lower Body Dressing/Undressing Lower body dressing   What is the patient wearing?: Pants       Pants- Performed by helper: Thread/unthread right pants leg, Thread/unthread left pants leg, Pull pants  up/down   Non-skid slipper socks- Performed by helper: Don/doff right sock, Don/doff left sock                  Lower body assist Assist for lower body dressing:  (Total A)      Toileting Toileting   Toileting steps completed by patient: Performs perineal hygiene Toileting steps completed by helper: Adjust clothing prior to toileting, Performs perineal hygiene, Adjust clothing after toileting Toileting Assistive  Devices: Grab bar or rail  Toileting assist Assist level:  (Total A)   Transfers Chair/bed transfer   Chair/bed transfer method: Stand pivot Chair/bed transfer assist level: Touching or steadying assistance (Pt > 75%) Chair/bed transfer assistive device: Armrests (hemiwalker) Mechanical lift: Stedy   Locomotion Ambulation Ambulation activity did not occur: Refused   Max distance: 15 Assist level: Touching or steadying assistance (Pt > 75%)   Wheelchair   Type: Manual Max wheelchair distance: 50 Assist Level: Touching or steadying assistance (Pt > 75%)  Cognition Comprehension Comprehension assist level: Follows basic conversation/direction with extra time/assistive device  Expression Expression assist level: Expresses basic needs/ideas: With no assist  Social Interaction Social Interaction assist level: Interacts appropriately 90% of the time - Needs monitoring or encouragement for participation or interaction.  Problem Solving Problem solving assist level: Solves basic 90% of the time/requires cueing < 10% of the time  Memory Memory assist level: Recognizes or recalls 90% of the time/requires cueing < 10% of the time    Medical Problem List and Plan: 1. 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 Status post loop recorder  Cont CIR, PT, OT, speech therapy, Team conference today please see physician documentation under team conference tab, met with team face-to-face to discuss problems,progress, and goals. Formulized individual treatment plan based on medical history, underlying problem and comorbidities.  Splints ordered, pt has not received  Fluoxetine started on 7/7, but pt refused, now, willing to take 2. DVT Prophylaxis/Anticoagulation: SCDs. Monitor for any signs of DVT. Venous Doppler study negative 3. Pain Management: Neurontin 300 mg 3 times a day, Topamax 50 mg twice a day, hydrocodone as needed, lateral  epicondylitis right elbow,Voltaren gel, We'll start when necessary, Zanaflex for spasms. 4. Diabetes mellitus of peripheral neuropathy. Hemoglobin A1c 7.3.   Lantus insulin increased to47 units twice a day on 7/13-, 7/17 increase to 50U BID, improved a.m. CBG  NovoLog 15 units 3 times a day, increased to 20U on 7/9.  Check blood sugars before meals and at bedtime. Diabetic teaching   CBG (last 3)   Recent Labs  03/25/16 1643 03/25/16 2137 03/26/16 0652  GLUCAP 124* 244* 154*    5. Neuropsych: This patient is capable of making decisions on her own behalf. 6. Skin/Wound Care: Routine skin checks 7. Fluids/Electrolytes/Nutrition: Routine I&O 8. Hypertension. Coreg 6.25 mg twice a day, heart rate is in a good range.  Filed Vitals:   03/25/16 1354 03/26/16 0359  BP: 146/49 178/65  Pulse: 78 76  Temp: 97.9 F (36.6 C) 97.7 F (36.5 C)  Resp: 18 18   9. Hypothyroidism. Synthroid 10. Hyperlipidemia. Crestor, fenofibrate 11. Asthma. Singulair 10 mg daily, Dulera 2 puffs twice daily, albuterol nebulizer as needed 12. Morbid Obesity. Body mass index is 40.39 kg/(m^2)., Diet and exercise education, cont to encourage weight loss to increase endurance and promote overall health. Dietary follow-up  13. Morganella UTI. 7 day course of Vantin 200 mg every 12 hours times initiated 03/13/2016, completed  14. Obesity hypoventilation syndrome: CPAP 15. Psychosocial, states that daughter works during the week, prefers, weekend discharge. LOS (Days) 12 A FACE TO FACE EVALUATION WAS PERFORMED  Edia Pursifull E 03/26/2016 10:04 AM

## 2016-03-26 NOTE — Progress Notes (Signed)
Physical Therapy Session Note  Patient Details  Name: Danielle Vargas MRN: [id number] Date of Birth: 06-Sep-1951  Today's Date: 03/26/2016 PT Individual Time: 1132-1200 and 1400-1500 PT Individual Time Calculation (min): 28 min and 60 min (total 88 min)   Short Term Goals: Week 2:  PT Short Term Goal 1 (Week 2): Pt will perform sit <>stand with min guard  PT Short Term Goal 2 (Week 2): Pt will perform bed mobility from flat bed with minA PT Short Term Goal 3 (Week 2): Pt will perform stand pivot transfer with minA and LRAD PT Short Term Goal 4 (Week 2): Pt will ambulate 15' with LRAD and modA PT Short Term Goal 5 (Week 2): Pt will initate stair training  Skilled Therapeutic Interventions/Progress Updates:    Tx 1: Pt received supine in bed, denies pain and agreeable to treatment. Supine>sit with modA and increased time to encourage pt to initiate task on her own. Stand pivot with RW and min/modA to A with LLE progression. Gait x8' and x15' with HW and minA, assist to reduce L knee recurvatum and facilitate weight shift to R to improve LLE clearance. Returned to room totalA, remained seated in w/c at completion of session all needs in reach.  Tx 2: Pt received seated in w/c, denies pain and agreeable to treatment. Pt's brother present for session and observed throughout; therapist educated family regarding pt's progress, goals, treatment techniques. Gait x25' with HW and minA; improving R weight shift to allow for LLE progression. Stairs 1x8 on 3" steps with R handrail and minA; again noting improving R weight shift to allow for stepping LLE onto next step. Therapist assisted with LLE progression during descent due to pt fatigue. Car transfer performed stand pivot with HW and minA for LLE progression and verbal cues to complete turn prior to sitting d/t impulsivity. LLE step ups to 3" and 6" steps x8 reps each with R handrail and minA; performed for LLE NMR, strengthening, and activity tolerance.  Returned to room totalA; remained seated in w/c with all needs in reach at completion of session.   Therapy Documentation Precautions:  Precautions Precautions: Fall Restrictions Weight Bearing Restrictions: No Pain: Pain Assessment Pain Assessment: No/denies pain   See Function Navigator for Current Functional Status.   Therapy/Group: Individual Therapy  Luberta Mutter 03/26/2016, 12:13 PM

## 2016-03-26 NOTE — Progress Notes (Signed)
Speech Language Pathology Daily Session Note  Patient Details  Name: Danielle Vargas MRN: [id number] Date of Birth: [birth year]  Today's Date: 03/26/2016 SLP Individual Time: 0802-0900 SLP Individual Time Calculation (min): 58 min  Short Term Goals: Week 2: SLP Short Term Goal 1 (Week 2): Pt will attend to her left upper extremity during semi-complex functional tasks with supervision verbal cues.   SLP Short Term Goal 2 (Week 2): Pt will utilize external memory aids to facilitate recall of information with mod I verbal cues.   SLP Short Term Goal 3 (Week 2): Pt will complete semi-complex novel tasks with supervision verbal cues for functional problem solving.   SLP Short Term Goal 4 (Week 2): Pt will return demonstration of at least 2 safety precautions with supervision verbal cues.   SLP Short Term Goal 5 (Week 2): Pt will selectively attend to semi-complex tasks for 15 minute intervals in a moderately distracting environment with supervision verbal cues for redirection.   SLP Short Term Goal 6 (Week 2): Pt will identify 2 deficits s/p CVA and their impact on her functional independence with min assist question cues.    Skilled Therapeutic Interventions:  Pt was seen for skilled ST targeting cognitive goals.  Pt had been incontinent prior to SLP's arrival and had not initiated request for hygiene from nursing unless questioned.  Pt was transferred to toilet where she voided an additional small amount.  Pt completed oral care and brushed hair with set up assist only and without notable left inattention.  Therapist facilitated the session with min assist verbal cues for functional problem solving when using her tablet to access familiar applications due to perseveration and decreased mental flexibility.  Pt was returned to room and left with call bell within reach.  Continue per current plan of care.      Function:  Eating Eating                 Cognition Comprehension Comprehension  assist level: Follows basic conversation/direction with no assist  Expression   Expression assist level: Expresses basic needs/ideas: With no assist  Social Interaction Social Interaction assist level: Interacts appropriately 90% of the time - Needs monitoring or encouragement for participation or interaction.  Problem Solving Problem solving assist level: Solves basic 75 - 89% of the time/requires cueing 10 - 24% of the time  Memory Memory assist level: Recognizes or recalls 90% of the time/requires cueing < 10% of the time    Pain Pain Assessment Pain Assessment: No/denies pain  Therapy/Group: Individual Therapy  Terrika Zuver, Selinda Orion 03/26/2016, 10:42 AM

## 2016-03-26 NOTE — Progress Notes (Signed)
Occupational Therapy Session Note  Patient Details  Name: Taleeah Daughety MRN: [id number] Date of Birth: Mar 10, 1951  Today's Date: 03/26/2016 OT Individual Time: 1000-1101 OT Individual Time Calculation (min): 61 min    Short Term Goals: Week 2:  OT Short Term Goal 1 (Week 2): STG = LTG due to remaining LOS  Skilled Therapeutic Interventions/Progress Updates:    Treatment session with focus on self care skills, functional transfers, and increased activity tolerance. Pt received in [facility] of room at start of session, agreeable to therapy. Pt consented to shower.   Squat pivot transfer WC<>shower bench with stand step to scoot laterally on bench - all at Min A with heavy use of grab bars. Pt requires Max A to complete LB bathing tasks due to body habitus and verbal cues for encouragement to attempt UB bathing tasks and to verbalize specific requests for assistance.   Pt transferred to bed to complete dressing tasks. Incontinence brief donned at bed level with Max A to don and Mod A for bed mobility. LB dressing completed EOB with verbal cues during attempts with reacher. Max A for LB dressing due to time compression at end of session. Pt completed UB dressing with verbal cues to follow through with task completion due to pt premature request for assistance in order to reduce caregiver burden and promote pt success.   Pt expressed fatigue at end of session. Mod to Max A for bed mobility to scoot to head of bed for optimal positioning. Left pt with bed alarm set and all needs in reach.   Therapy Documentation Precautions:  Precautions Precautions: Fall Restrictions Weight Bearing Restrictions: No General:   Vital Signs:  Pain: Pain Assessment Pain Assessment: No/denies pain ADL: ADL ADL Comments: refer to functional navigator Exercises:   Other Treatments:    See Function Navigator for Current Functional Status.   Therapy/Group: Individual Therapy  Dierdre Searles 03/26/2016,  3:12 PM

## 2016-03-27 ENCOUNTER — Telehealth: Payer: Self-pay | Admitting: Cardiology

## 2016-03-27 ENCOUNTER — Inpatient Hospital Stay (HOSPITAL_COMMUNITY): Payer: Medicare Other | Admitting: Speech Pathology

## 2016-03-27 ENCOUNTER — Inpatient Hospital Stay (HOSPITAL_COMMUNITY): Payer: Medicare Other | Admitting: Physical Therapy

## 2016-03-27 ENCOUNTER — Inpatient Hospital Stay (HOSPITAL_COMMUNITY): Payer: Medicare Other | Admitting: Occupational Therapy

## 2016-03-27 LAB — GLUCOSE, CAPILLARY
GLUCOSE-CAPILLARY: 161 mg/dL — AB (ref 65–99)
GLUCOSE-CAPILLARY: 140 mg/dL — AB (ref 65–99)
GLUCOSE-CAPILLARY: 176 mg/dL — AB (ref 65–99)
Glucose-Capillary: 125 mg/dL — ABNORMAL HIGH (ref 65–99)

## 2016-03-27 NOTE — Progress Notes (Signed)
Occupational Therapy Session Note  Patient Details  Name: Riana Collinson MRN: [id number] Date of Birth: 03-23-1951  Today's Date: 03/27/2016 OT Individual Time: 1300-1400 OT Individual Time Calculation (min): 60 min    Short Term Goals: Week 2:  OT Short Term Goal 1 (Week 2): STG = LTG due to remaining LOS  Skilled Therapeutic Interventions/Progress Updates:    Treatment session with focus on NMR of affected LUE. Pt received in [facility] of room, awake, alert, and agreeable to therapy. Pt propelled to therapy gym by clinician and transferred [facility] <> EOM with Min A.   Engaged pt in e-stim for approximately 15 min at 20Hz , 10 sec on/off with primary focus on engaging LUE wrist and finger extensors to inhibit development of flexor tone and elicit motor recovery. Pt responded well to e-stim. Included occupational component to e-stim activity by having pt grasp cup then bring to mouth. Clinician facilitated post pt initiation of movement at elbow in sync with e-stim pulse. Mod verbal cues provided to pt to redirect attention to LUE with pt follow through when verbal cues paired with visual cues for redirect. Provided pt education regarding physiological recovery typical with CVA (such as proximal to distal) to which pt verbalized understanding.   Engaged pt in [place] using 1lb dowel rod in hands bilaterally while completing sit to stand with Min A to S. Pt given verbal cues to stand then perform shoulder flexion bilaterally to raise dowel no more than 90* at most to upgrade challenge. Pt responded positively to activity and expressed increased confidence with standing. Clinician challenged pt to stand at S preparatory to transferring to [facility] with Min A - pt completed challenge and expressed enjoyment.   Pt propelled back to room in [facility] by clinician.   Therapy Documentation Precautions:  Precautions Precautions: Fall Restrictions Weight Bearing Restrictions: No   Vital Signs: Therapy Vitals Temp: 97.6 F  (36.4 C) Temp Source: Oral Pulse Rate: 78 Resp: 18 BP: (!) 130/58 mmHg Patient Position (if appropriate): Lying Oxygen Therapy SpO2: 94 % O2 Device: Not Delivered Pain: Pain Assessment Pain Assessment: No/denies pain ADL: ADL ADL Comments: refer to functional navigator  See Function Navigator for Current Functional Status.   Therapy/Group: Individual Therapy  Dierdre Searles 03/27/2016, 3:18 PM

## 2016-03-27 NOTE — Progress Notes (Signed)
Speech Language Pathology Daily Session Note  Patient Details  Name: Danielle Vargas MRN: [id number] Date of Birth: December 26, 1950  Today's Date: 03/27/2016 SLP Individual Time: 1000-1100 SLP Individual Time Calculation (min): 60 min  Short Term Goals: Week 2: SLP Short Term Goal 1 (Week 2): Pt will attend to her left upper extremity during semi-complex functional tasks with supervision verbal cues.   SLP Short Term Goal 2 (Week 2): Pt will utilize external memory aids to facilitate recall of information with mod I verbal cues.   SLP Short Term Goal 3 (Week 2): Pt will complete semi-complex novel tasks with supervision verbal cues for functional problem solving.   SLP Short Term Goal 4 (Week 2): Pt will return demonstration of at least 2 safety precautions with supervision verbal cues.   SLP Short Term Goal 5 (Week 2): Pt will selectively attend to semi-complex tasks for 15 minute intervals in a moderately distracting environment with supervision verbal cues for redirection.   SLP Short Term Goal 6 (Week 2): Pt will identify 2 deficits s/p CVA and their impact on her functional independence with min assist question cues.    Skilled Therapeutic Interventions:  Pt was seen for skilled ST targeting cognitive goals.  SLP facilitated the session with an oral reading task targeting visual scanning to the left of midline.  Pt was 100% accurate when reading newspaper articles with mod I for complete scanning of the Nikolaj Geraghty.  SLP also introduced a novel card game targeting use of memory aids (specifically association).  Pt was able to make word-picture associations with mod I.  Pt required overall supervision question cues for working memory and processing speed during the abovementioned task.  After a ~10 minute delay pt was able to recall 10 specific category members for 100% accuracy with mod I.  Pt was returned to room and left in wheelchair with call bell within reach.  Continue per current plan of care.     Function:  Eating Eating                 Cognition Comprehension Comprehension assist level: Follows basic conversation/direction with no assist  Expression   Expression assist level: Expresses basic needs/ideas: With no assist  Social Interaction Social Interaction assist level: Interacts appropriately 90% of the time - Needs monitoring or encouragement for participation or interaction.  Problem Solving Problem solving assist level: Solves basic 75 - 89% of the time/requires cueing 10 - 24% of the time  Memory Memory assist level: Recognizes or recalls 90% of the time/requires cueing < 10% of the time    Pain Pain Assessment Pain Assessment: No/denies pain  Therapy/Group: Individual Therapy  Cynithia Hakimi, Selinda Orion 03/27/2016, 3:13 PM

## 2016-03-27 NOTE — Progress Notes (Signed)
[place] PHYSICAL MEDICINE & REHABILITATION     PROGRESS NOTE  Subjective/Complaints:   Moving Left arm some  ROS: Denies CP, SOB, N/V/D.  Objective: Vital Signs: Blood pressure 121/56, pulse 72, temperature 98 F (36.7 C), temperature source Oral, resp. rate 18, height 5\' 1"  (1.549 m), weight 99.4 kg (219 lb 2.2 oz), SpO2 94 %. No results found. No results for input(s): WBC, HGB, HCT, PLT in the last 72 hours. No results for input(s): NA, K, CL, GLUCOSE, BUN, CREATININE, CALCIUM in the last 72 hours.  Invalid input(s): CO CBG (last 3)   Recent Labs  03/26/16 1714 03/26/16 2109 03/27/16 0713  GLUCAP 248* 165* 125*    Wt Readings from Last 3 Encounters:  03/24/16 99.4 kg (219 lb 2.2 oz)  03/13/16 96.9 kg (213 lb 10 oz)  03/04/16 100.245 kg (221 lb)    Physical Exam:  BP 121/56 mmHg  Pulse 72  Temp(Src) 98 F (36.7 C) (Oral)  Resp 18  Ht 5\' 1"  (1.549 m)  Wt 99.4 kg (219 lb 2.2 oz)  BMI 41.43 kg/m2  SpO2 94% Constitutional: She appears well-developed and well-nourished.  HENT: Normocephalic and atraumatic.  Cardiovascular: Normal rate and regular rhythm.  Respiratory: Effort normal and breath sounds normal. No respiratory distress.  GI: Soft. Bowel sounds are normal. She exhibits no distension.  Musculoskeletal: She exhibits no edema or tenderness. No pain with range of motion of the foot or ankle on the left side.  Neurological: She is alert and oriented. No evidence of clonus at the left ankle  Follow commands.  Left facial weakness. Motor: RUE: 5/5 proximal to distal RLE: Hip flexion 4+/5, knee extension 4/5, ankle dorsi/plantar flexion 5/5 LUE: 0/5  LLE: able to wiggle toes, otherwise 0/5 Skin: Skin is warm and dry.  Psychiatric:  Mood, affect,brighter extremities show no evidence of edema and no pain with range of motion of the toes of either foot. No erythema Assessment/Plan: 1. 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 Physiatrist is providing close team supervision and 24 hour management of active medical problems listed below. Physiatrist and rehab team continue to assess barriers to discharge/monitor patient progress toward functional and medical goals.  Function:  Bathing Bathing position   Position: Shower  Bathing parts Body parts bathed by patient: Left arm, Chest, Abdomen, Right lower leg, Left lower leg Body parts bathed by helper: Right arm, Front perineal area, Buttocks, Right upper leg, Left upper leg, Back  Bathing assist Assist Level:  (Max A)      Upper Body Dressing/Undressing Upper body dressing   What is the patient wearing?: Pull over shirt/dress     Pull over shirt/dress - Perfomed by patient: Put head through opening, Thread/unthread left sleeve, Thread/unthread right sleeve Pull over shirt/dress - Perfomed by helper: Pull shirt over trunk        Upper body assist Assist Level: Touching or steadying assistance(Pt > 75%)      Lower Body Dressing/Undressing Lower body dressing   What is the patient wearing?: Pants, Non-skid slipper socks       Pants- Performed by helper: Thread/unthread right pants leg, Pull pants up/down, Thread/unthread left pants leg   Non-skid slipper socks- Performed by helper: Don/doff right sock, Don/doff left sock                  Lower body  assist Assist for lower body dressing:  (Max A)      Toileting Toileting   Toileting steps completed by patient: Performs perineal hygiene Toileting steps completed by helper: Adjust clothing prior to toileting, Performs perineal hygiene, Adjust clothing after toileting Toileting Assistive Devices: Grab bar or rail  Toileting assist Assist level:  (Total A)   Transfers Chair/bed transfer   Chair/bed transfer method: Stand pivot Chair/bed transfer  assist level: Touching or steadying assistance (Pt > 75%) Chair/bed transfer assistive device: Armrests (HW) Mechanical lift: Stedy   Locomotion Ambulation Ambulation activity did not occur: Refused   Max distance: 25 Assist level: Touching or steadying assistance (Pt > 75%)   Wheelchair   Type: Manual Max wheelchair distance: 50 Assist Level: Touching or steadying assistance (Pt > 75%)  Cognition Comprehension Comprehension assist level: Follows basic conversation/direction with no assist  Expression Expression assist level: Expresses basic needs/ideas: With no assist  Social Interaction Social Interaction assist level: Interacts appropriately 90% of the time - Needs monitoring or encouragement for participation or interaction.  Problem Solving Problem solving assist level: Solves basic 75 - 89% of the time/requires cueing 10 - 24% of the time  Memory Memory assist level: Recognizes or recalls 90% of the time/requires cueing < 10% of the time    Medical Problem List and Plan: 1. 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 Status post loop recorder  Cont CIR, PT, OT, speech therapy,  Splints ordered, pt has not received  Fluoxetine started on 7/7, but pt refused, now, willing to take 2. DVT Prophylaxis/Anticoagulation: SCDs. Monitor for any signs of DVT. Venous Doppler study negative 3. Pain Management: Neurontin 300 mg 3 times a day, Topamax 50 mg twice a day, hydrocodone as needed, lateral epicondylitis right elbow,Voltaren gel, We'll start when necessary, Zanaflex for spasms. 4. Diabetes mellitus of peripheral neuropathy. Hemoglobin A1c 7.3.   Lantus insulin increased to47 units twice a day on 7/13-, 7/17 increase to 50U BID, improved a.m. CBG  NovoLog 15 units 3 times a day, increased to 20U on 7/9.  Check blood sugars before meals and at bedtime. Diabetic teaching   CBG (last 3)   Recent Labs  03/26/16 1714  03/26/16 2109 03/27/16 0713  GLUCAP 248* 165* 125*    5. Neuropsych: This patient is capable of making decisions on her own behalf. 6. Skin/Wound Care: Routine skin checks 7. Fluids/Electrolytes/Nutrition: Routine I&O 8. Hypertension. Coreg 6.25 mg twice a day, heart rate is in a good range.  Filed Vitals:   03/26/16 2056 03/27/16 0603  BP:  121/56  Pulse: 68 72  Temp:  98 F (36.7 C)  Resp: 18 18   9. Hypothyroidism. Synthroid 10. Hyperlipidemia. Crestor, fenofibrate 11. Asthma. Singulair 10 mg daily, Dulera 2 puffs twice daily, albuterol nebulizer as needed 12. Morbid Obesity. Body mass index is 40.39 kg/(m^2)., Diet and exercise education, cont to encourage weight loss to increase endurance and promote overall health. Dietary follow-up  13. Morganella UTI. 7 day course of Vantin 200 mg every 12 hours times initiated 03/13/2016, completed 14. Obesity hypoventilation syndrome: CPAP 15. Psychosocial, states that daughter works during the week, prefers, weekend discharge. LOS (Days) 13 A FACE TO FACE EVALUATION WAS PERFORMED  Charlett Blake 03/27/2016 8:35 AM

## 2016-03-27 NOTE — Progress Notes (Signed)
Physical Therapy Session Note  Patient Details  Name: Danielle Vargas MRN: [id number] Date of Birth: 01-Aug-1951  Today's Date: 03/27/2016 PT Individual Time: 0900-1000 PT Individual Time Calculation (min): 60 min   Short Term Goals: Week 2:  PT Short Term Goal 1 (Week 2): Pt will perform sit <>stand with min guard  PT Short Term Goal 2 (Week 2): Pt will perform bed mobility from flat bed with minA PT Short Term Goal 3 (Week 2): Pt will perform stand pivot transfer with minA and LRAD PT Short Term Goal 4 (Week 2): Pt will ambulate 65' with LRAD and modA PT Short Term Goal 5 (Week 2): Pt will initate stair training  Skilled Therapeutic Interventions/Progress Updates:    Pt received supine in bed, denies pain and agreeable to treatment. Supine>sit with bedrails and modA for trunk righting to midline. Stand pivot transfer modA to w/c with HW and assist to facilitate R weight shift and improve LLE clearance. Seated in w/c, pt performed upper body dressing minA for threading LLE, maxA lower body dressing for threading BLEs and assisting with pulling pants up. Seated at sink pt performs oral care with setupA. Gait with HW 2x5-7' per trial. Pt with very poor motivation compared to yesterday's session, demanding several extended rest breaks and significantly increased time to initiate all tasks due to "needing to rest more". Educated pt on therapist's role to support pt and provide opportunities to improve mobility, however pt must initiate participation and set baseline intensity. Pt performed stand pivot transfer w/c <>mat table with HW and minA; poor LLE progression despite multimodal cueing for increase R weight shift and importance of turning feet completely before sitting for increased safety and joint protection. Returned to room totalA; remained seated in w/c at completion of session, all needs in reach.   Therapy Documentation Precautions:  Precautions Precautions: Fall Restrictions Weight  Bearing Restrictions: No Pain: Pain Assessment Pain Assessment: 0-10 Pain Score: Asleep   See Function Navigator for Current Functional Status.   Therapy/Group: Individual Therapy  Luberta Mutter 03/27/2016, 10:05 AM

## 2016-03-27 NOTE — Progress Notes (Signed)
Orthopedic Tech Progress Note Patient Details:  Danielle Vargas 10-01-1950 [id number]  Patient ID: [name], female   DOB: 11-23-1950, 65 y.o.   MRN: [id number]   [place] Pink 03/27/2016, 9:32 AMCalled [facility] for Left AFO.

## 2016-03-27 NOTE — Progress Notes (Signed)
Physical Therapy Session Note  Patient Details  Name: Danielle Vargas MRN: [id number] Date of Birth: 1951/05/25  Today's Date: 03/27/2016 PT Individual Time: 1540-1605 PT Individual Time Calculation (min): 25 min   Short Term Goals: Week 1:  PT Short Term Goal 1 (Week 1): Patient will performed sit<>stand with mod A from PT PT Short Term Goal 1 - Progress (Week 1): Met PT Short Term Goal 2 (Week 1): Patient will initiate gait training.  PT Short Term Goal 2 - Progress (Week 1): Met PT Short Term Goal 3 (Week 1): Patient will perform bed mobility with mod A from PT.  PT Short Term Goal 3 - Progress (Week 1): Met PT Short Term Goal 4 (Week 1): Patient will perform chair<> bed transfer with mod A.  PT Short Term Goal 4 - Progress (Week 1): Met PT Short Term Goal 5 (Week 1): Patient will perform WC mobility for 116f with min A.  PT Short Term Goal 5 - Progress (Week 1): Not met Week 2:  PT Short Term Goal 1 (Week 2): Pt will perform sit <>stand with min guard  PT Short Term Goal 2 (Week 2): Pt will perform bed mobility from flat bed with minA PT Short Term Goal 3 (Week 2): Pt will perform stand pivot transfer with minA and LRAD PT Short Term Goal 4 (Week 2): Pt will ambulate 572 with LRAD and modA PT Short Term Goal 5 (Week 2): Pt will initate stair training  Skilled Therapeutic Interventions/Progress Updates:    [facility]  Gait in hall for 25 ft with HW, L AFO, min A from PT and WC follow. PT provided assist with body habitus over LLE to allow  Improved limb advancement.  Min cues from PT for improved sequencing of movement with proper step-to gait pattern as well as proper length of step. Patient noted to have L knee snap back x 2 with gait during last 5 ft of ambulation training.   Transfer training with HW x 4. PT provided min A and mod cues for improved sequencing of movements as well as increased weight shift over R LE to allow improved movement of  the LLE.   WC mobility for 538fwith max A from PT with  R Hemi technique. Patient demonstrated difficulty with hemi technique due to inadequate length of R LE to achieve meaningful contact with floor.   Patient left sitting in wheel chair with call bell in reach.     Therapy Documentation Precautions:  Precautions Precautions: Fall Restrictions Weight Bearing Restrictions: No General:   Vital Signs: Therapy Vitals Temp: 97.6 F (36.4 C) Temp Source: Oral Pulse Rate: 78 Resp: 18 BP: (!) 130/58 mmHg Patient Position (if appropriate): Lying Oxygen Therapy SpO2: 94 % O2 Device: Not Delivered Pain: Pain Assessment Pain Assessment: No/denies pain   See Function Navigator for Current Functional Status.   Therapy/Group: Individual Therapy  AuLorie Phenix/20/2017, 4:11 PM

## 2016-03-27 NOTE — Telephone Encounter (Signed)
LMOVM requesting that pt send manual transmission b/c home monitor has not updated in at least 14 days.    

## 2016-03-28 ENCOUNTER — Inpatient Hospital Stay (HOSPITAL_COMMUNITY): Payer: Medicare Other | Admitting: Physical Therapy

## 2016-03-28 ENCOUNTER — Inpatient Hospital Stay (HOSPITAL_COMMUNITY): Payer: Medicare Other | Admitting: Occupational Therapy

## 2016-03-28 ENCOUNTER — Inpatient Hospital Stay (HOSPITAL_COMMUNITY): Payer: Medicare Other | Admitting: Speech Pathology

## 2016-03-28 LAB — GLUCOSE, CAPILLARY
GLUCOSE-CAPILLARY: 109 mg/dL — AB (ref 65–99)
GLUCOSE-CAPILLARY: 138 mg/dL — AB (ref 65–99)
GLUCOSE-CAPILLARY: 156 mg/dL — AB (ref 65–99)

## 2016-03-28 NOTE — Progress Notes (Signed)
Occupational Therapy Session Note  Patient Details  Name: Danielle Vargas MRN: [id number] Date of Birth: Jan 09, 1951  Today's Date: 03/28/2016 OT Individual Time: 4098-1191 OT Individual Time Calculation (min): 60 min    Short Term Goals: Week 1:  OT Short Term Goal 1 (Week 1): Pt will sit on EOB with S for 5 min to prepare for transfer. OT Short Term Goal 1 - Progress (Week 1): Met OT Short Term Goal 2 (Week 1): Pt will be able to squat pivot transfer to W/c with mod A +2 to prepare for toilet transfers.  OT Short Term Goal 2 - Progress (Week 1): Met OT Short Term Goal 3 (Week 1): Pt will tolerate standing in stedy lift for 2 minutes for clothing change. OT Short Term Goal 3 - Progress (Week 1): Met OT Short Term Goal 4 (Week 1): Pt will be independent with self ROM program for LUE. OT Short Term Goal 4 - Progress (Week 1): Met Week 2:  OT Short Term Goal 1 (Week 2): STG = LTG due to remaining LOS     Skilled Therapeutic Interventions/Progress Updates:    Pt seen for skilled OT to facilitate dynamic balance and adaptive techniques during shower and dressing skills. Pt completed stand pivot transfers to tub bench in shower with steady A. In shower pt did actively lift L arm slightly to wash underneath. Pt did extremely well with maintaining balance in standing to wash her bottom. She needed guiding A to manage shirt to don over L arm and L leg. She was able to stand up from w/c with close S without UE support so she could use R hand to hold pants and to pull brief and pants halfway up the hips with R hand.   Estim attended to L wrist/finger extensors at intensity 20 for 15 min, 35 pps, 10 sec on and off. Pt tolerated estim well. Pt worked on visually focusing on hand and visualizing that she was extending her fingers herself.  Pt's family arrived at end of session. Pt in w/c with all needs met.   Therapy Documentation Precautions:  Precautions Precautions: Fall Restrictions Weight  Bearing Restrictions: No    Vital Signs: Therapy Vitals Temp: 97.4 F (36.3 C) Temp Source: Oral Pulse Rate: 75 Resp: 18 BP: 128/60 mmHg Patient Position (if appropriate): Lying Oxygen Therapy SpO2: 98 % O2 Device: Not Delivered Pain: Pain Assessment Pain Assessment: No/denies pain ADL: ADL ADL Comments: refer to functional navigator  See Function Navigator for Current Functional Status.   Therapy/Group: Individual Therapy  SAGUIER,JULIA 03/28/2016, 9:23 AM

## 2016-03-28 NOTE — Progress Notes (Signed)
Social Work Patient ID: Danielle Vargas, female   DOB: 1951/03/30, 65 y.o.   MRN: [id number]   CSW met with pt to update her on team conference.  She agrees that she cannot go home at her current level of function and does not have a 24/7 caregiver.  Pt agrees to CSW pursuing SNF placement and CSW gave her a list of facilities in the area.  Lennart Pall, CSW, then talked to pt's dtr who plans to begin touring SNFs.   Then CSW will begin the search and follow up with pt and dtr to present options.  Pt should be cleared for transfer to SNF soon.  CSW will continue to follow and assist as needed.

## 2016-03-28 NOTE — Progress Notes (Signed)
Occupational Therapy Note  Patient Details  Name: Danielle Vargas MRN: [id number] Date of Birth: 1951-03-27  Today's Date: 03/28/2016 OT Individual Time: [id number] OT Individual Time Calculation (min): 60 min   1:1 NMR   In gym focus on AAROM in supine and sitting in all planes and with/ without gravity promoting normal patterns of movement.  Pt with increasing more initiation and movement in shoulder, bicep, triceps  today in all three positions. Pt now with more active ability to turn on tone in right UE during PNF exercises and able to achieve higher ROM with minimal support.  Pt still with no wrist, finger or supination/ pronation detected in his session. Returned to recliner with safety belt and call bell in place.   No c/o pain    Willeen Cass [facility] 03/28/2016, 12:10 PM

## 2016-03-28 NOTE — Patient Care Conference (Signed)
Inpatient RehabilitationTeam Conference and Plan of Care Update Date: 03/26/2016   Time: 11:00 AM    Patient Name: Danielle Vargas      Medical Record Number: [id number]  Date of Birth: [birth year] Sex: Female         Room/Bed: 4W11C/4W11C-01 Payor Info: Payor: MEDICARE / Plan: MEDICARE PART A AND B / Product Type: *No Product type* /    Admitting Diagnosis: CVA  Admit Date/Time:  03/14/2016  6:38 PM Admission Comments: No comment available   Primary Diagnosis:  <principal problem not specified> Principal Problem: <principal problem not specified>  Patient Active Problem List   Diagnosis Date Noted  . DM type 2 with diabetic peripheral neuropathy ([place])   . Vascular headache   . Right middle cerebral artery stroke ([place]) 03/14/2016  . Asthma   . Acute lower UTI   . Benign essential HTN   . HLD (hyperlipidemia)   . S/P rotator cuff repair   . Tachypnea   . AKI (acute kidney injury) ([place])   . Acute right MCA stroke ([place])   . Back pain   . Ischemic stroke ([place]) 03/10/2016  . Stroke (cerebrum) ([place]) 03/10/2016  . Chest pain, rule out acute myocardial infarction 11/10/2015  . Chest pain 11/10/2015  . Obesity   . Hypothyroidism   . 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 UTI (urinary tract infection) 06/16/2015  . Diabetes mellitus ([place]) 06/16/2015  . RUQ abdominal pain 04/17/2015  . Hx of colonic polyps 04/17/2015  . Family hx of colon cancer 04/17/2015  . CPAP rhinitis 12/08/2014  . OSA on CPAP 12/08/2014  . 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 Retinopathy due to secondary diabetes mellitus, without macular edema, with moderate nonproliferative retinopathy ([place]) 09/18/2014  . Neuropathy due to secondary diabetes mellitus ([place]) 09/18/2014  . Diabetes mellitus due to underlying condition with other diabetic neurological complication ([place]) [id number]  . Obesity hypoventilation syndrome ([place]) 09/18/2014  . CAD in native artery 01/23/2014  . Morbid obesity ([place]) 01/23/2014   . Renal insufficiency 11/17/2013  . HTN (hypertension) 10/29/2013  . Chronic diastolic heart failure ([place]) 10/29/2013  . 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 LADA (latent autoimmune diabetes in adults), managed as type 1 ([place]) 08/08/2013    Expected Discharge Date: Expected Discharge Date:  (SNF)  Team Members Present: Physician leading conference: Dr. Alysia Penna Social Worker Present: Alfonse Alpers, LCSW Nurse Present: Heather Roberts, RN PT Present: Kem Parkinson, PT OT Present: Simonne Come, OT SLP Present: Windell Moulding, SLP PPS Coordinator present : Daiva Nakayama, RN, CRRN     Current Status/Progress Goal Weekly Team Focus  Medical   elbow pain improved, cont of bowel         Bowel/Bladder   incont of bladder, cont of bowel. LBM: 7/18. Pt says she cannot tell that she has to pee until it has already happened. She dripples throughout the day and voids frequently.   cont x2   timed toileting, assess the need for medications?   Swallow/Nutrition/ Hydration             ADL's   Dressing/bathing at overall Max A;   Mod A bathing, LB dressing, and toilet transfers; Min A UB dressing   improved activity tolerance, functional transfers, trunk control, ADL retraining, LUE NMR   Mobility   min/mod bed mobility, modA transfers, modA gait with rail and have initiated gait training with HW  S bed mobility, S transfers, minA gait   NMR, gait training, activity tolerance, sitting/standing balance,  transfer training   Communication             Safety/Cognition/ Behavioral Observations  min assist   supervision   awareness of deficits    Pain   left hip, right and left elbow pain at times. PRN medication effective & scheduled voltaren gel   less than 3   assess and treat qshift and PRN   Skin   left lateral knee skin tear- OTA, healing. loop recorder   remain free from skin breakdown/infection   assess skin qshift and PRN and document/notify of any findings     Rehab  Goals Patient on target to meet rehab goals: Yes Rehab Goals Revised: none *See Care Plan and progress notes for long and short-term goals.  Barriers to Discharge: inc of bladder    Possible Resolutions to Barriers:  timed toileting program    Discharge Planning/Teaching Needs:  DC plan changed to SNF  not needed, unless plan changes back to home with family   Team Discussion:  Pt will be ready for SNF soon.  Her elbow pain is better with voltaren, diabetic pain is better, and pt is sleeping better.  She is incontinent of bladder and nursing will try timed toileting.  Pt is continent of bowel.  Pt is participating and motivated with PT and is getting return in arm and leg.  Pt goes between min to mod assist with ADLs and will not have 24/7 care at home, so will need SNF.  Revisions to Treatment Plan:  none   Continued Need for Acute Rehabilitation Level of Care: The patient requires daily medical management by a physician with specialized training in physical medicine and rehabilitation for the following conditions: Daily direction of a multidisciplinary physical rehabilitation program to ensure safe treatment while eliciting the highest outcome that is of practical value to the patient.: Yes Daily medical management of patient stability for increased activity during participation in an intensive rehabilitation regime.: Yes Daily analysis of laboratory values and/or radiology reports with any subsequent need for medication adjustment of medical intervention for : Neurological problems  Aziz Slape, Silvestre Mesi 03/28/2016, 11:11 AM

## 2016-03-28 NOTE — Progress Notes (Signed)
Physical Therapy Session Note  Patient Details  Name: Danielle Vargas MRN: [id number] Date of Birth: 06-20-1951  Today's Date: 03/28/2016 PT Individual Time: 1300-1400 PT Individual Time Calculation (min): 60 min   Short Term Goals: Week 2:  PT Short Term Goal 1 (Week 2): Pt will perform sit <>stand with min guard  PT Short Term Goal 2 (Week 2): Pt will perform bed mobility from flat bed with minA PT Short Term Goal 3 (Week 2): Pt will perform stand pivot transfer with minA and LRAD PT Short Term Goal 4 (Week 2): Pt will ambulate 29' with LRAD and modA PT Short Term Goal 5 (Week 2): Pt will initate stair training  Skilled Therapeutic Interventions/Progress Updates:    Pt received seated in w/c, denies pain and agreeable to treatment. Pt very excited by transfers improving and reports she has been doing well with nursing staff to/from toilet. Gait x20' with HW and minA, occasional assist for weight shifting to R side. One gait trial x25' with pt navigating around cones R/L with HW and minA. One gait trial x20' with orthotist present for assessment in [place]; assist as above and pt very fatigued after completion. Stand pivot transfer w/c <>mat table with close S; min verbal cues for technique to increase weight shifting. Standing balance with RLE taps to 4" step for LLE weight bearing and stance control; minA overall due to increase in L lateral lean with fatigue. Returned to room totalA; remained seated in w/c with all needs in reach.   Therapy Documentation Precautions:  Precautions Precautions: Fall Restrictions Weight Bearing Restrictions: No Pain: Pain Assessment Pain Assessment: No/denies pain Pain Score: 0-No pain   See Function Navigator for Current Functional Status.   Therapy/Group: Individual Therapy  Luberta Mutter 03/28/2016, 2:54 PM

## 2016-03-28 NOTE — Progress Notes (Signed)
Social Work Patient ID: Danielle Vargas, female   DOB: 1950-11-08, 65 y.o.   MRN: [id number]   Danielle Sloop, LCSW Social Worker Signed  Patient Care Conference 03/28/2016 11:11 AM    Expand All Collapse All   Inpatient RehabilitationTeam Conference and Plan of Care Update Date: 03/26/2016   Time: 11:00 AM     Patient Name: Danielle Vargas       Medical Record Number: [id number]  Date of Birth: [birth year] Sex: Female         Room/Bed: 4W11C/4W11C-01 Payor Info: Payor: MEDICARE / Plan: MEDICARE PART A AND B / Product Type: *No Product type* /    Admitting Diagnosis: CVA  Admit Date/Time:  03/14/2016  6:38 PM Admission Comments: No comment available   Primary Diagnosis:  <principal problem not specified> Principal Problem: <principal problem not specified>    Patient Active Problem List     Diagnosis  Date Noted   .  DM type 2 with diabetic peripheral neuropathy ([facility])     .  Vascular headache     .  Right middle cerebral artery stroke ([facility])  03/14/2016   .  Asthma     .  Acute lower UTI     .  Benign essential HTN     .  HLD (hyperlipidemia)     .  S/P rotator cuff repair     .  Tachypnea     .  AKI (acute kidney injury) ([facility])     .  Acute right MCA stroke ([facility])     .  Back pain     .  Ischemic stroke ([facility])  03/10/2016   .  Stroke (cerebrum) ([facility])  03/10/2016   .  Chest pain, rule out acute myocardial infarction  11/10/2015   .  Chest pain  11/10/2015   .  Obesity     .  Hypothyroidism     .  Recurrent nephrolithiasis  07/17/2015   .  UTI (urinary tract infection)  06/16/2015   .  Diabetes mellitus ([facility])  06/16/2015   .  RUQ abdominal pain  04/17/2015   .  Hx of colonic polyps  04/17/2015   .  Family hx of colon cancer  04/17/2015   .  CPAP rhinitis  12/08/2014   .  OSA on CPAP  12/08/2014   .  Hypersomnia with sleep apnea  09/18/2014   .  Retinopathy due to secondary diabetes mellitus, without macular edema, with moderate nonproliferative retinopathy ([facility])  09/18/2014    .  Neuropathy due to secondary diabetes mellitus ([facility])  09/18/2014   .  Diabetes mellitus due to underlying condition with other diabetic neurological complication ([facility])  09/18/2014   .  Obesity hypoventilation syndrome ([facility])  09/18/2014   .  CAD in native artery  01/23/2014   .  Morbid obesity ([facility])  01/23/2014   .  Renal insufficiency  11/17/2013   .  HTN (hypertension)  10/29/2013   .  Chronic diastolic heart failure ([facility])  35/68/6168   .  Old myocardial infarction  10/26/2013   .  LADA (latent autoimmune diabetes in adults), managed as type 1 ([facility])  08/08/2013     Expected Discharge Date: Expected Discharge Date:  (SNF)  Team Members Present: Physician leading conference: Dr. Claudette Vargas Social Worker Present: Danielle Acosta, LCSW Nurse Present: Danielle End, RN PT Present: Danielle Vargas, PT OT Present: Danielle Vargas, OT SLP Present: Danielle Vargas, SLP PPS Coordinator present : Danielle Duck, RN, CRRN  Current Status/Progress  Goal  Weekly Team Focus   Medical     elbow pain improved, cont of bowel          Bowel/Bladder     incont of bladder, cont of bowel. LBM: 7/18. Pt says she cannot tell that she has to pee until it has already happened. She dripples throughout the day and voids frequently.   cont x2   timed toileting, assess the need for medications?   Swallow/Nutrition/ Hydration               ADL's     Dressing/bathing at overall Max A;    Mod A bathing, LB dressing, and toilet transfers; Min A UB dressing   improved activity tolerance, functional transfers, trunk control, ADL retraining, LUE NMR   Mobility     min/mod bed mobility, modA transfers, modA gait with rail and have initiated gait training with HW  S bed mobility, S transfers, minA gait    NMR, gait training, activity tolerance, sitting/standing balance, transfer training   Communication               Safety/Cognition/ Behavioral Observations    min assist   supervision   awareness of  deficits    Pain     left hip, right and left elbow pain at times. PRN medication effective & scheduled voltaren gel   less than 3   assess and treat qshift and PRN   Skin     left lateral knee skin tear- OTA, healing. loop recorder    remain free from skin breakdown/infection   assess skin qshift and PRN and document/notify of any findings      Rehab Goals Patient on target to meet rehab goals: Yes Rehab Goals Revised: none *See Care Plan and progress notes for long and short-term goals.    Barriers to Discharge:  inc of bladder     Possible Resolutions to Barriers:   timed toileting program     Discharge Planning/Teaching Needs:   DC plan changed to SNF  not needed, unless plan changes back to home with family    Team Discussion:    Pt will be ready for SNF soon.  Her elbow pain is better with voltaren, diabetic pain is better, and pt is sleeping better.  She is incontinent of bladder and nursing will try timed toileting.  Pt is continent of bowel.  Pt is participating and motivated with PT and is getting return in arm and leg.  Pt goes between min to mod assist with ADLs and will not have 24/7 care at home, so will need SNF.   Revisions to Treatment Plan:    none    Continued Need for Acute Rehabilitation Level of Care: The patient requires daily medical management by a physician with specialized training in physical medicine and rehabilitation for the following conditions: Daily direction of a multidisciplinary physical rehabilitation program to ensure safe treatment while eliciting the highest outcome that is of practical value to the patient.: Yes Daily medical management of patient stability for increased activity during participation in an intensive rehabilitation regime.: Yes Daily analysis of laboratory values and/or radiology reports with any subsequent need for medication adjustment of medical intervention for : Neurological problems  Danielle Vargas, Danielle Vargas 03/28/2016,  11:11 AM

## 2016-03-28 NOTE — NC FL2 (Signed)
[place] LEVEL OF CARE SCREENING TOOL     IDENTIFICATION  Patient Name: Danielle Vargas Birthdate: 25-Jan-1951 Sex: female Admission Date (Current Location): 03/14/2016  [facility] and [place] Number:  Herbalist and Address:  [facility], [place] [address], [place], [place] [address]      Provider Number: [id number]  Attending Physician Name and Address:  Charlett Blake, MD  Relative Name and Phone Number:       Current Level of Care: Other (Comment) (Inpatient Rehab at [facility]) Recommended Level of Care: [place] Prior Approval Number:    Date Approved/Denied:   PASRR Number: [id number]  Discharge Plan: SNF    Current Diagnoses: Patient Active Problem List   Diagnosis Date Noted  . DM type 2 with diabetic peripheral neuropathy ([place])   . Vascular headache   . Right middle cerebral artery stroke ([place]) 03/14/2016  . Asthma   . Acute lower UTI   . Benign essential HTN   . HLD (hyperlipidemia)   . S/P rotator cuff repair   . Tachypnea   . AKI (acute kidney injury) ([place])   . Acute right MCA stroke ([place])   . Back pain   . Ischemic stroke ([place]) 03/10/2016  . Stroke (cerebrum) ([place]) 03/10/2016  . Chest pain, rule out acute myocardial infarction 11/10/2015  . Chest pain 11/10/2015  . Obesity   . Hypothyroidism   . 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 UTI (urinary tract infection) 06/16/2015  . Diabetes mellitus ([place]) 06/16/2015  . RUQ abdominal pain 04/17/2015  . Hx of colonic polyps 04/17/2015  . Family hx of colon cancer 04/17/2015  . CPAP rhinitis 12/08/2014  . OSA on CPAP 12/08/2014  . 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 Retinopathy due to secondary diabetes mellitus, without macular edema, with moderate nonproliferative retinopathy ([place]) 09/18/2014  . Neuropathy due to secondary diabetes mellitus ([place]) 09/18/2014  . Diabetes mellitus due to underlying condition with other  diabetic neurological complication ([place]) [id number]  . Obesity hypoventilation syndrome ([place]) 09/18/2014  . CAD in native artery 01/23/2014  . Morbid obesity ([place]) 01/23/2014  . Renal insufficiency 11/17/2013  . HTN (hypertension) 10/29/2013  . Chronic diastolic heart failure ([place]) 10/29/2013  . 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 LADA (latent autoimmune diabetes in adults), managed as type 1 ([place]) 08/08/2013    Orientation RESPIRATION BLADDER Height & Weight     Self, Time, Situation, Place  Normal Incontinent Weight: 99.4 kg (219 lb 2.2 oz) Height:  5\' 1"  (154.9 cm)  BEHAVIORAL SYMPTOMS/MOOD NEUROLOGICAL BOWEL NUTRITION STATUS      Continent Diet (heart healthy, carb modified diet)  AMBULATORY STATUS COMMUNICATION OF NEEDS Skin   Limited Assist Verbally Surgical wounds, Other (Comment) (skin tear - healing; loop recorder insertion site - healing)                       Personal Care Assistance Level of Assistance  Bathing, Feeding, Dressing Bathing Assistance: Maximum assistance Feeding assistance: Limited assistance (set up only) Dressing Assistance: Maximum assistance     Functional Limitations Info             SPECIAL CARE FACTORS FREQUENCY  PT (By licensed PT), OT (By licensed OT), Bowel and bladder program, Speech therapy, Blood pressure Blood Pressure Frequency: 2x/day   PT Frequency: 5x/week OT Frequency: 5x/week Bowel and Bladder Program Frequency: timed toileting   Speech Therapy Frequency: 3-5x/week  Contractures Contractures Info: Not present    Additional Factors Info  Insulin Sliding Scale Code Status Info: Full     Insulin Sliding Scale Info: novolog 0-15 units 3x/day with meals       Current Medications (03/28/2016):  This is the current hospital active medication list Current Facility-Administered Medications  Medication Dose Route Frequency Provider Last Rate Last Dose  . acetaminophen (TYLENOL) tablet 650 mg  650 mg Oral Q4H  PRN Lavon Paganini Angiulli, PA-C   650 mg at 03/15/16 2323   Or  . acetaminophen (TYLENOL) suppository 650 mg  650 mg Rectal Q4H PRN Lavon Paganini Angiulli, PA-C      . albuterol (PROVENTIL) (2.5 MG/3ML) 0.083% nebulizer solution 2.5 mg  2.5 mg Nebulization Q6H PRN Lavon Paganini Angiulli, PA-C      . carvedilol (COREG) tablet 6.25 mg  6.25 mg Oral BID WC Daniel J Angiulli, PA-C   6.25 mg at 03/27/16 1825  . clopidogrel (PLAVIX) tablet 75 mg  75 mg Oral Daily Lavon Paganini Angiulli, PA-C   75 mg at 03/28/16 0827  . diclofenac sodium (VOLTAREN) 1 % transdermal gel 2 g  2 g Topical QID Charlett Blake, MD   2 g at 03/28/16 0630  . docusate sodium (COLACE) capsule 100 mg  100 mg Oral BID Lavon Paganini Angiulli, PA-C   100 mg at 03/28/16 [id number]  . fenofibrate tablet 160 mg  160 mg Oral QHS Lavon Paganini Angiulli, PA-C   160 mg at 03/27/16 2116  . FLUoxetine (PROZAC) capsule 10 mg  10 mg Oral Daily Ankit Lorie Phenix, MD   10 mg at 03/28/16 [id number]  . gabapentin (NEURONTIN) capsule 300 mg  300 mg Oral TID Lavon Paganini Angiulli, PA-C   300 mg at 03/28/16 [id number]  . HYDROcodone-acetaminophen (NORCO/VICODIN) 5-325 MG per tablet 1 tablet  1 tablet Oral Q6H PRN Lavon Paganini Angiulli, PA-C   1 tablet at 03/28/16 1158  . insulin aspart (novoLOG) injection 0-15 Units  0-15 Units Subcutaneous TID WC Lavon Paganini Angiulli, PA-C   2 Units at 03/28/16 1200  . insulin aspart (novoLOG) injection 20 Units  20 Units Subcutaneous TID WC Ankit Lorie Phenix, MD   20 Units at 03/28/16 1159  . insulin glargine (LANTUS) injection 50 Units  50 Units Subcutaneous BID Charlett Blake, MD   50 Units at 03/28/16 [phone]  . levothyroxine (SYNTHROID, LEVOTHROID) tablet 100 mcg  100 mcg Oral QAC breakfast Lavon Paganini Angiulli, PA-C   100 mcg at 03/28/16 0631  . metoCLOPramide (REGLAN) tablet 10 mg  10 mg Oral QID Lavon Paganini Angiulli, PA-C   10 mg at 03/28/16 1159  . mometasone-formoterol (DULERA) 200-5 MCG/ACT inhaler 2 puff  2 puff Inhalation BID Lavon Paganini Angiulli, PA-C   2 puff at  03/27/16 2042  . montelukast (SINGULAIR) tablet 10 mg  10 mg Oral QHS Lavon Paganini Angiulli, PA-C   10 mg at 03/27/16 2116  . nitroGLYCERIN (NITROSTAT) SL tablet 0.4 mg  0.4 mg Sublingual Q5 min PRN Lavon Paganini Angiulli, PA-C      . ondansetron [facility]) tablet 4 mg  4 mg Oral Q6H PRN Lavon Paganini Angiulli, PA-C       Or  . ondansetron (ZOFRAN) injection 4 mg  4 mg Intravenous Q6H PRN Daniel J Angiulli, PA-C      . pantoprazole (PROTONIX) EC tablet 40 mg  40 mg Oral Daily Daniel J Angiulli, PA-C   40 mg at 03/28/16 0830  . polyethylene glycol (MIRALAX /  GLYCOLAX) packet 17 g  17 g Oral QODAY Daniel J Angiulli, PA-C   17 g at 03/25/16 0810  . rosuvastatin (CRESTOR) tablet 20 mg  20 mg Oral Daily Lavon Paganini Angiulli, PA-C   20 mg at 03/28/16 0830  . sorbitol 70 % solution 30 mL  30 mL Oral Daily PRN Lavon Paganini Angiulli, PA-C      . tiZANidine (ZANAFLEX) tablet 2 mg  2 mg Oral Q8H PRN Charlett Blake, MD   2 mg at 03/28/16 0021  . topiramate (TOPAMAX) tablet 50 mg  50 mg Oral BID Lavon Paganini Angiulli, PA-C   50 mg at 03/28/16 [id number]     Discharge Medications: Please see discharge summary for a list of discharge medications.  Relevant Imaging Results:  Relevant Lab Results:   Additional Information SSN:  [id number]  Jkayla Spiewak, Silvestre Mesi, LCSW

## 2016-03-28 NOTE — Progress Notes (Signed)
Speech Language Pathology Daily Session Note  Patient Details  Name: Danielle Vargas MRN: [id number] Date of Birth: 1951-09-02  Today's Date: 03/28/2016 SLP Individual Time: [id number] SLP Individual Time Calculation (min): 60 min  Short Term Goals: Week 2: SLP Short Term Goal 1 (Week 2): Pt will attend to her left upper extremity during semi-complex functional tasks with supervision verbal cues.   SLP Short Term Goal 2 (Week 2): Pt will utilize external memory aids to facilitate recall of information with mod I verbal cues.   SLP Short Term Goal 3 (Week 2): Pt will complete semi-complex novel tasks with supervision verbal cues for functional problem solving.   SLP Short Term Goal 4 (Week 2): Pt will return demonstration of at least 2 safety precautions with supervision verbal cues.   SLP Short Term Goal 5 (Week 2): Pt will selectively attend to semi-complex tasks for 15 minute intervals in a moderately distracting environment with supervision verbal cues for redirection.   SLP Short Term Goal 6 (Week 2): Pt will identify 2 deficits s/p CVA and their impact on her functional independence with min assist question cues.    Skilled Therapeutic Interventions: Skilled treatment session focused on cognition goals. SLP facilitated session by providing tasks targeting recall of previously learned game, return demonstration of least 2 safety precautions and identifying 2 deficits s/p CVA. Pt with independent with tasks. Pt able to complete semi-complex tasks for functional problem solving with Mod I to independent level. Pt was returned to room, left in wheelchair with all needs within reach. Continue current plan of care.   Function:  Eating Eating   Modified Consistency Diet: No Eating Assist Level: More than reasonable amount of time;Set up assist for   Eating Set Up Assist For: Cutting food;Opening containers       Cognition Comprehension Comprehension assist level: Follows basic  conversation/direction with no assist  Expression   Expression assist level: Expresses basic needs/ideas: With no assist  Social Interaction Social Interaction assist level: Interacts appropriately 90% of the time - Needs monitoring or encouragement for participation or interaction.  Problem Solving Problem solving assist level: Solves basic 75 - 89% of the time/requires cueing 10 - 24% of the time  Memory Memory assist level: Recognizes or recalls 90% of the time/requires cueing < 10% of the time    Pain Pain Assessment Pain Assessment: No/denies pain Pain Score: 0-No pain  Therapy/Group: Individual Therapy  Aviel Davalos 03/28/2016, 4:45 PM

## 2016-03-29 ENCOUNTER — Inpatient Hospital Stay (HOSPITAL_COMMUNITY): Payer: Medicare Other | Admitting: Physical Therapy

## 2016-03-29 LAB — GLUCOSE, CAPILLARY
GLUCOSE-CAPILLARY: 118 mg/dL — AB (ref 65–99)
GLUCOSE-CAPILLARY: 173 mg/dL — AB (ref 65–99)
Glucose-Capillary: 158 mg/dL — ABNORMAL HIGH (ref 65–99)
Glucose-Capillary: 136 mg/dL — ABNORMAL HIGH (ref 65–99)
Glucose-Capillary: 149 mg/dL — ABNORMAL HIGH (ref 65–99)

## 2016-03-29 NOTE — Progress Notes (Signed)
Physical Therapy Session Note  Patient Details  Name: Danielle Vargas MRN: [id number] Date of Birth: [birth year]  Today's Date: 03/29/2016 PT Individual Time: 1594-5859 PT Individual Time Calculation (min): 46 min   Short Term Goals: Week 1:  PT Short Term Goal 1 (Week 1): Patient will performed sit<>stand with mod A from PT PT Short Term Goal 1 - Progress (Week 1): Met PT Short Term Goal 2 (Week 1): Patient will initiate gait training.  PT Short Term Goal 2 - Progress (Week 1): Met PT Short Term Goal 3 (Week 1): Patient will perform bed mobility with mod A from PT.  PT Short Term Goal 3 - Progress (Week 1): Met PT Short Term Goal 4 (Week 1): Patient will perform chair<> bed transfer with mod A.  PT Short Term Goal 4 - Progress (Week 1): Met PT Short Term Goal 5 (Week 1): Patient will perform WC mobility for 171f with min A.  PT Short Term Goal 5 - Progress (Week 1): Not met Week 2:  PT Short Term Goal 1 (Week 2): Pt will perform sit <>stand with min guard  PT Short Term Goal 2 (Week 2): Pt will perform bed mobility from flat bed with minA PT Short Term Goal 3 (Week 2): Pt will perform stand pivot transfer with minA and LRAD PT Short Term Goal 4 (Week 2): Pt will ambulate 569 with LRAD and modA PT Short Term Goal 5 (Week 2): Pt will initate stair training Week 3:     Skilled Therapeutic Interventions/Progress Updates:    Patient received sitting in WC and agreeable to PT.  PT assisted patient to don B shoes and L AFO with max A. Increased difficulty getting L shoe to fit with grip socks. Patient educated on use of regular socks to allow improved comfort and fit with AFO in L shoe.    Patient performed gait training in hall with min A for 254fand 3028fsing HW. Min-mod cues provided by PT for increased forward movement with HW with each step to allow improved stability and increased step length. Patient demonstrated step to gait pattern on the first bout of gait training and partial  step through on 2nd bout.   Patient performed lateral/anterior stepping to one of 2  targets with LLE, R UE supported on HW. Mod cues for improved WB through R LE and increased LLE hip flexion prior to knee flexion to allow improved foot clearance with stepping back to neutral position.  Throughout treatment patient performed sit<>stand x 8 with supervision A for improved safety and use of HW. Stand pivot transfers completed x 4 with min A from PT and HW.   Patient returned to room and performed stand pivot transfer with min A to bed. PT provided mod A for sit>supine through lob roll. Patient left supine in bed with call bell in reach.   Therapy Documentation Precautions:  Precautions Precautions: Fall Restrictions Weight Bearing Restrictions: No General:   Vital Signs: Therapy Vitals Pulse Rate: 74 Resp: 18 BP: (!) 152/59 mmHg Patient Position (if appropriate): Lying Oxygen Therapy SpO2: 94 % O2 Device: Not Delivered Pain:   0/10   See Function Navigator for Current Functional Status.   Therapy/Group: Individual Therapy  AusLorie Phenix22/2017, 6:18 PM

## 2016-03-29 NOTE — Progress Notes (Signed)
[facility] PHYSICAL MEDICINE & REHABILITATION     PROGRESS NOTE  Subjective/Complaints:   Patient asking about stroke etiology.  ROS: Denies CP, SOB, N/V/D.  Objective: Vital Signs: Blood pressure 141/52, pulse 68, temperature 97.5 F (36.4 C), temperature source Oral, resp. rate 18, height 5\' 1"  (1.549 m), weight 99.4 kg (219 lb 2.2 oz), SpO2 96 %. No results found. No results for input(s): WBC, HGB, HCT, PLT in the last 72 hours. No results for input(s): NA, K, CL, GLUCOSE, BUN, CREATININE, CALCIUM in the last 72 hours.  Invalid input(s): CO CBG (last 3)   Recent Labs  03/28/16 1140 03/28/16 1637 03/29/16 0644  GLUCAP 138* 156* 118*    Wt Readings from Last 3 Encounters:  03/24/16 99.4 kg (219 lb 2.2 oz)  03/13/16 96.9 kg (213 lb 10 oz)  03/04/16 100.245 kg (221 lb)    Physical Exam:  BP 141/52 mmHg  Pulse 68  Temp(Src) 97.5 F (36.4 C) (Oral)  Resp 18  Ht 5\' 1"  (1.549 m)  Wt 99.4 kg (219 lb 2.2 oz)  BMI 41.43 kg/m2  SpO2 96% Constitutional: She appears well-developed and well-nourished.  HENT: Normocephalic and atraumatic.  Cardiovascular: Normal rate and regular rhythm.  Respiratory: Effort normal and breath sounds normal. No respiratory distress.  GI: Soft. Bowel sounds are normal. She exhibits no distension.  Musculoskeletal: She exhibits no edema or tenderness. No pain with range of motion of the foot or ankle on the left side.  Neurological: She is alert and oriented. No evidence of clonus at the left ankle  Follow commands.  Left facial weakness. Motor: RUE: 5/5 proximal to distal RLE: Hip flexion 4+/5, knee extension 4/5, ankle dorsi/plantar flexion 5/5 LUE: 2 minus /5 left grip, 2 minus, shoulder protraction and retraction, 2 minus, triceps, 0 biceps LLE: able to wiggle toes, otherwise 0/5 Skin: Skin is warm and dry.  Psychiatric:  Mood, affect,brighter extremities show no evidence of edema and no pain with range of motion of the toes of  either foot. No erythema Assessment/Plan: 1. 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 Physiatrist is providing close team supervision and 24 hour management of active medical problems listed below. Physiatrist and rehab team continue to assess barriers to discharge/monitor patient progress toward functional and medical goals.  Function:  Bathing Bathing position   Position: Shower  Bathing parts Body parts bathed by patient: Left arm, Chest, Abdomen, Buttocks, Right upper leg, Left upper leg, Front perineal area Body parts bathed by helper: Right arm, Back, Left lower leg, Right lower leg  Bathing assist Assist Level:  (Max A)      Upper Body Dressing/Undressing Upper body dressing   What is the patient wearing?: Pull over shirt/dress     Pull over shirt/dress - Perfomed by patient: Thread/unthread right sleeve, Put head through opening, Pull shirt over trunk Pull over shirt/dress - Perfomed by helper: Thread/unthread left sleeve        Upper body assist Assist Level: Touching or steadying assistance(Pt > 75%)      Lower Body Dressing/Undressing Lower body dressing   What is the patient wearing?: Pants, Non-skid slipper socks     Pants- Performed by patient: Thread/unthread right pants leg Pants- Performed by helper: Thread/unthread left pants leg, Pull pants up/down   Non-skid slipper socks- Performed by helper: Don/doff right sock, Don/doff left sock  Shoes - Performed by helper: Don/doff right shoe, Don/doff left shoe, Fasten right, Fasten left   AFO - Performed by helper: Don/doff left AFO      Lower body assist Assist for lower body dressing:  (modA)      Toileting Toileting   Toileting steps completed by patient: Performs perineal hygiene Toileting steps completed by helper: Adjust clothing  prior to toileting, Performs perineal hygiene, Adjust clothing after toileting Toileting Assistive Devices: Grab bar or rail  Toileting assist Assist level:  (Total A)   Transfers Chair/bed transfer   Chair/bed transfer method: Stand pivot Chair/bed transfer assist level: Supervision or verbal cues Chair/bed transfer assistive device:  (HW) Mechanical lift: Stedy   Locomotion Ambulation Ambulation activity did not occur: Refused   Max distance: 20 Assist level: Touching or steadying assistance (Pt > 75%)   Wheelchair   Type: Manual Max wheelchair distance: 51ft  Assist Level: Maximal assistance (Pt 25 - 49%)  Cognition Comprehension Comprehension assist level: Follows basic conversation/direction with no assist  Expression Expression assist level: Expresses basic needs/ideas: With no assist  Social Interaction Social Interaction assist level: Interacts appropriately 90% of the time - Needs monitoring or encouragement for participation or interaction.  Problem Solving Problem solving assist level: Solves basic 75 - 89% of the time/requires cueing 10 - 24% of the time  Memory Memory assist level: Recognizes or recalls 90% of the time/requires cueing < 10% of the time    Medical Problem List and Plan: 1. 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 Status post loop recorder  Cont CIR, PT, OT, speech therapy,  Splints ordered, pt has not received   2. DVT Prophylaxis/Anticoagulation: SCDs. Monitor for any signs of DVT. Venous Doppler study negative 3. Pain Management: Neurontin 300 mg 3 times a day, Topamax 50 mg twice a day, hydrocodone as needed, lateral epicondylitis right elbow,Voltaren gel, We'll start when necessary, Zanaflex for spasms. 4. Diabetes mellitus of peripheral neuropathy. Hemoglobin A1c 7.3.   Lantus insulin increased to47 units twice a day on 7/13-, 7/17 increase to 50U BID, improved a.m.  CBG  NovoLog 15 units 3 times a day, increased to 20U on 7/9.  Check blood sugars before meals and at bedtime. Diabetic teaching, Continue current dosing   CBG (last 3)   Recent Labs  03/28/16 1140 03/28/16 1637 03/29/16 0644  GLUCAP 138* 156* 118*    5. Neuropsych: This patient is capable of making decisions on her own behalf. 6. Skin/Wound Care: Routine skin checks 7. Fluids/Electrolytes/Nutrition: Routine I&O 8. Hypertension. Coreg 6.25 mg twice a day, heart rate is in a good range.  Filed Vitals:   03/28/16 2115 03/29/16 0512  BP:  141/52  Pulse: 70 68  Temp:  97.5 F (36.4 C)  Resp: 16 18   9. Hypothyroidism. Synthroid 10. Hyperlipidemia. Crestor, fenofibrate 11. Asthma. Singulair 10 mg daily, Dulera 2 puffs twice daily, albuterol nebulizer as needed 12. Morbid Obesity. Body mass index is 40.39 kg/(m^2)., Diet and exercise education, cont to encourage weight loss to increase endurance and promote overall health. Dietary follow-up   13. Obesity hypoventilation syndrome: CPAP, Patient states she uses it regularly at home, but not regularly. In the hospital 15. Psychosocial, patient likely going to SNF post discharge LOS (Days) [place] E 03/29/2016 9:56 AM

## 2016-03-30 ENCOUNTER — Inpatient Hospital Stay (HOSPITAL_COMMUNITY): Payer: Medicare Other | Admitting: Physical Therapy

## 2016-03-30 LAB — GLUCOSE, CAPILLARY
GLUCOSE-CAPILLARY: 139 mg/dL — AB (ref 65–99)
GLUCOSE-CAPILLARY: 192 mg/dL — AB (ref 65–99)
GLUCOSE-CAPILLARY: 199 mg/dL — AB (ref 65–99)
Glucose-Capillary: 241 mg/dL — ABNORMAL HIGH (ref 65–99)

## 2016-03-30 MED ORDER — FUROSEMIDE 20 MG PO TABS
10.0000 mg | ORAL_TABLET | Freq: Every day | ORAL | Status: DC
  Administered 2016-03-30 – 2016-04-02 (×4): 10 mg via ORAL
  Filled 2016-03-30 (×4): qty 1

## 2016-03-30 NOTE — Progress Notes (Signed)
Patient refused CPAP for the night. Patient is aware to have RN call Respiratory if she changes her mind.

## 2016-03-30 NOTE — Progress Notes (Signed)
[place] PHYSICAL MEDICINE & REHABILITATION     PROGRESS NOTE  Subjective/Complaints:   Patient asking about stroke etiology.  ROS: Denies CP, SOB, N/V/D.  Objective: Vital Signs: Blood pressure (!) 159/73, pulse 86, temperature 97.7 F (36.5 C), temperature source Oral, resp. rate 18, height 5\' 1"  (1.549 m), weight 99.4 kg (219 lb 2.2 oz), SpO2 97 %. No results found. No results for input(s): WBC, HGB, HCT, PLT in the last 72 hours. No results for input(s): NA, K, CL, GLUCOSE, BUN, CREATININE, CALCIUM in the last 72 hours.  Invalid input(s): CO CBG (last 3)   Recent Labs  03/29/16 2046 03/29/16 2310 03/30/16 0702  GLUCAP 136* 149* 139*    Wt Readings from Last 3 Encounters:  03/24/16 99.4 kg (219 lb 2.2 oz)  03/13/16 96.9 kg (213 lb 10 oz)  03/04/16 100.2 kg (221 lb)    Physical Exam:  BP (!) 159/73   Pulse 86   Temp 97.7 F (36.5 C) (Oral)   Resp 18   Ht 5\' 1"  (1.549 m)   Wt 99.4 kg (219 lb 2.2 oz)   SpO2 97%   BMI 41.41 kg/m  Constitutional: She appears well-developed and well-nourished.  HENT: Normocephalic and atraumatic.  Cardiovascular: Normal rate and regular rhythm.  Respiratory: Effort normal and breath sounds normal. No respiratory distress.  GI: Soft. Bowel sounds are normal. She exhibits no distension.  Musculoskeletal: She exhibits no edema or tenderness. No pain with range of motion of the foot or ankle on the left side.  Neurological: She is alert and oriented. No evidence of clonus at the left ankle  Follow commands.  Left facial weakness. Motor: RUE: 5/5 proximal to distal RLE: Hip flexion 4+/5, knee extension 4/5, ankle dorsi/plantar flexion 5/5 LUE: 2 minus /5 left grip, 2 minus, shoulder protraction and retraction, 2 minus, triceps, 0 biceps, Patient is now able to pronate LLE: 3 minus. Knee extension, trace ankle dorsiflexion Skin: Skin is warm and dry.  Psychiatric:  Mood, affect,brighter extremities show no evidence of edema  and no pain with range of motion of the toes of either foot. No erythema Assessment/Plan: 1. 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 Physiatrist is providing close team supervision and 24 hour management of active medical problems listed below. Physiatrist and rehab team continue to assess barriers to discharge/monitor patient progress toward functional and medical goals.  Function:  Bathing Bathing position   Position: Shower  Bathing parts Body parts bathed by patient: Left arm, Chest, Abdomen, Buttocks, Right upper leg, Left upper leg, Front perineal area Body parts bathed by helper: Right arm, Back, Left lower leg, Right lower leg  Bathing assist Assist Level:  (Max A)      Upper Body Dressing/Undressing Upper body dressing   What is the patient wearing?: Pull over shirt/dress     Pull over shirt/dress - Perfomed by patient: Thread/unthread right sleeve, Put head through opening, Pull shirt over trunk Pull over shirt/dress - Perfomed by helper: Thread/unthread left sleeve        Upper body assist Assist Level: Touching or steadying assistance(Pt > 75%)      Lower Body Dressing/Undressing Lower body dressing   What is the patient wearing?: Pants, Non-skid slipper socks     Pants- Performed by patient: Thread/unthread right pants leg Pants- Performed by helper: Thread/unthread left pants leg, Pull pants up/down  Non-skid slipper socks- Performed by helper: Don/doff right sock, Don/doff left sock       Shoes - Performed by helper: Don/doff right shoe, Don/doff left shoe, Fasten right, Fasten left   AFO - Performed by helper: Don/doff left AFO      Lower body assist Assist for lower body dressing:  (modA)      Toileting Toileting   Toileting steps completed by patient: Performs perineal  hygiene Toileting steps completed by helper: Adjust clothing prior to toileting, Performs perineal hygiene, Adjust clothing after toileting Toileting Assistive Devices: Grab bar or rail  Toileting assist Assist level:  (Total A)   Transfers Chair/bed transfer   Chair/bed transfer method: Stand pivot Chair/bed transfer assist level: Touching or steadying assistance (Pt > 75%) Chair/bed transfer assistive device: Armrests, Walker Mechanical lift: Ecologist Ambulation activity did not occur: Refused   Max distance: 43ft Assist level: Touching or steadying assistance (Pt > 75%)   Wheelchair   Type: Manual Max wheelchair distance: 64ft  Assist Level: Maximal assistance (Pt 25 - 49%)  Cognition Comprehension Comprehension assist level: Follows basic conversation/direction with no assist  Expression Expression assist level: Expresses basic needs/ideas: With no assist  Social Interaction Social Interaction assist level: Interacts appropriately 90% of the time - Needs monitoring or encouragement for participation or interaction.  Problem Solving Problem solving assist level: Solves basic 75 - 89% of the time/requires cueing 10 - 24% of the time  Memory Memory assist level: Recognizes or recalls 90% of the time/requires cueing < 10% of the time    Medical Problem List and Plan: 1. 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 Status post loop recorder  Cont CIR, PT, OT, speech therapy,     2. DVT Prophylaxis/Anticoagulation: SCDs. Monitor for any signs of DVT. Venous Doppler study negative 3. Pain Management: Neurontin 300 mg 3 times a day, Topamax 50 mg twice a day, hydrocodone as needed, lateral epicondylitis right elbow,Voltaren gel, We'll start when necessary, Zanaflex for spasms. 4. Diabetes mellitus of peripheral neuropathy. Hemoglobin A1c 7.3.   Lantus insulin increased to47 units twice a day on  7/13-, 7/17 increase to 50U BID, improved a.m. CBG  NovoLog 15 units 3 times a day, increased to 20U on 7/9.  Check blood sugars before meals and at bedtime. Diabetic teaching, No change since diabetic management. Today   CBG (last 3)   Recent Labs  03/29/16 2046 03/29/16 2310 03/30/16 0702  GLUCAP 136* 149* 139*    5. Neuropsych: This patient is capable of making decisions on her own behalf. 6. Skin/Wound Care: Routine skin checks 7. Fluids/Electrolytes/Nutrition: Routine I&O 8. Hypertension. Coreg 6.25 mg twice a day, heart rate is in a good range.Will start managing systolic blood pressure more aggressively  Vitals:   03/30/16 0533 03/30/16 0816  BP: (!) 164/72 (!) 159/73  Pulse: 89 86  Resp: 18   Temp: 97.7 F (36.5 C)    9. Hypothyroidism. Synthroid 10. Hyperlipidemia. Crestor, fenofibrate 11. Asthma. Singulair 10 mg daily, Dulera 2 puffs twice daily, albuterol nebulizer as needed 12. Morbid Obesity. Body mass index is 40.39 kg/(m^2)., Diet and exercise education, cont to encourage weight loss to increase endurance and promote overall health. Dietary follow-up   13. Obesity hypoventilation syndrome: CPAP, Patient states she uses it regularly at home, but not regularly. In the hospital 15. Psychosocial, patient likely going to SNF post discharge LOS (Days) 16 A FACE TO FACE  EVALUATION WAS PERFORMED  Charlett Blake 03/30/2016 9:59 AM

## 2016-03-30 NOTE — Progress Notes (Signed)
Patient vomited x1 during her sleep. Complaint of nausea and mild abdominal pain. She felt like she did not digest her dinner well. Patient also felt very warm and was sweating. Vitals 98.6 138/39 75 and 95% on room air. Blood sugar was 149. Zofran 4mg  tablet prn given. Repositioned patient and thermostat readjusted to 65 degrees. Applied cool wash cloth to forehead and patient felt a little better. Re-assessed patient after 30 min -1 hour, she was fast asleep. Will continue to monitor.

## 2016-03-30 NOTE — Progress Notes (Signed)
Physical Therapy Session Note  Patient Details  Name: Danielle Vargas MRN: [id number] Date of Birth: 1951/01/21  Today's Date: 03/30/2016 PT Individual Time: [id number] PT Individual Time Calculation (min): 58 min    Short Term Goals: Week 2:  PT Short Term Goal 1 (Week 2): Pt will perform sit <>stand with min guard  PT Short Term Goal 2 (Week 2): Pt will perform bed mobility from flat bed with minA PT Short Term Goal 3 (Week 2): Pt will perform stand pivot transfer with minA and LRAD PT Short Term Goal 4 (Week 2): Pt will ambulate 71' with LRAD and modA PT Short Term Goal 5 (Week 2): Pt will initate stair training  Skilled Therapeutic Interventions/Progress Updates:   Pt received seated in w/c, daughter present, denies pain and agreeable to treatment. Pt reports wet brief; stand pivot to toilet with close S and grab bars. Pt urinated again while on toilet. Therapist performed clothing management and hygiene totalA while pt stood using grab bar and S. Upper body dressing minA for threading LUE, lower body dressing maxA for threading BLEs and assisting with pulling pants up. Gait x38' with HW and min guard; min cues for step through pattern. Second trial with RW and L hand splint x27'; pt educated on benefits of ambulating with RW to include weight bearing through LLE, more symmetrical postural control and reduce compensations, as well as more fluid gait pattern. Pt initially with difficulty R weight shifting, improved to symmetrical step length and fluid gait pattern, however as pt fatigued increased difficulty with R weight shift and LLE progression, and had minor LOB requiring modA to maintain balance. Stairs 1x8 on 3" stairs with R handrail and min guard. Second trial on 6" stairs with R rail and modA overall d/t fatigue and increased demand on RLE, with pt report of "giving out" during descent. Returned to room totalA; pt remained seated in w/c with daughter present and all needs in reach.    Therapy Documentation Precautions:  Precautions Precautions: Fall Restrictions Weight Bearing Restrictions: No Pain: Pain Assessment Pain Assessment: No/denies pain   See Function Navigator for Current Functional Status.   Therapy/Group: Individual Therapy  Luberta Mutter 03/30/2016, 10:51 AM

## 2016-03-31 ENCOUNTER — Inpatient Hospital Stay (HOSPITAL_COMMUNITY): Payer: Medicare Other | Admitting: Speech Pathology

## 2016-03-31 ENCOUNTER — Inpatient Hospital Stay (HOSPITAL_COMMUNITY): Payer: Medicare Other | Admitting: Physical Therapy

## 2016-03-31 ENCOUNTER — Inpatient Hospital Stay (HOSPITAL_COMMUNITY): Payer: Medicare Other | Admitting: Occupational Therapy

## 2016-03-31 LAB — GLUCOSE, CAPILLARY
GLUCOSE-CAPILLARY: 113 mg/dL — AB (ref 65–99)
GLUCOSE-CAPILLARY: 117 mg/dL — AB (ref 65–99)
Glucose-Capillary: 175 mg/dL — ABNORMAL HIGH (ref 65–99)
Glucose-Capillary: 73 mg/dL (ref 65–99)
Glucose-Capillary: 229 mg/dL — ABNORMAL HIGH (ref 65–99)

## 2016-03-31 NOTE — Progress Notes (Signed)
Physical Therapy Session Note  Patient Details  Name: Danielle Vargas MRN: [id number] Date of Birth: 12-Jun-1951  Today's Date: 03/31/2016 PT Individual Time: 0900-1000 PT Individual Time Calculation (min): 60 min    Short Term Goals: Week 2:  PT Short Term Goal 1 (Week 2): Pt will perform sit <>stand with min guard  PT Short Term Goal 2 (Week 2): Pt will perform bed mobility from flat bed with minA PT Short Term Goal 3 (Week 2): Pt will perform stand pivot transfer with minA and LRAD PT Short Term Goal 4 (Week 2): Pt will ambulate 44' with LRAD and modA PT Short Term Goal 5 (Week 2): Pt will initate stair training  Skilled Therapeutic Interventions/Progress Updates:   Pt received seated in w/c, denies pain and agreeable to tx. Performed upper body dressing with minA to assist pulling shirt over L elbow after pt had threaded hand and was unsuccessful with several attempts. MaxA for lower body dressing for time/energy conservation. Gait with HW and min guard/minA x44' with occasional assist with R weight shift for improved LLE progression. Stand pivot transfer chair>w/c with HW and S. Stand pivot transfer w/c>flat bed in ADL apartment. Requires minA for sit >supine for LLE management, S for rolling in bed, and minA for L sidelying >sit for trunk righting. Gait short distances within apartment with HW and min guard. Furniture transfer from low couch with S and HW. Pt noted to have difficulty when turning to sit in a chair progressing LLE backwards toward chair. Backwards gait in parallel bars with RUE support, and cues for larger LLE step length. Able to perform step through pattern backward with LLE when weight shifting sufficiently to R side. Returned to room totalA; remained seated in w/c at end of session, all needs in reach.   Therapy Documentation Precautions:  Precautions Precautions: Fall Restrictions Weight Bearing Restrictions: No Pain: Pain Assessment Pain Assessment: No/denies  pain Pain Score: 0-No pain   See Function Navigator for Current Functional Status.   Therapy/Group: Individual Therapy  Luberta Mutter 03/31/2016, 10:07 AM

## 2016-03-31 NOTE — Progress Notes (Signed)
Social Work Patient ID: Danielle Vargas, female   DOB: 11-22-1950, 65 y.o.   MRN: [id number]   Met with pt to discuss choices for facilities she reports daughter is touring facilities and will let me know what her preferences are. Both aware pt is medically ready to go to the next venue of care.

## 2016-03-31 NOTE — Progress Notes (Signed)
Occupational Therapy Session Note  Patient Details  Name: Danielle Vargas MRN: [id number] Date of Birth: [birth year]  Today's Date: 03/31/2016 OT Individual Time: 1016-1101 OT Individual Time Calculation (min): 45 min     Short Term Goals: Week 2:  OT Short Term Goal 1 (Week 2): STG = LTG due to remaining LOS  Skilled Therapeutic Interventions/Progress Updates:    Treatment session with focus on sit to stand transfers, AAROM of LUE, and problem solving LB dressing. Pt received in [facility] of room with c/o pain in L shoulder rated 8/10 for which RN administered pain medication.   Pt completed sit to stand transfer WC <> EOM with Min A for weight shifting due to decreased pt confidence and difficulty weight shifting. Engaged pt in [place] with BUE using #1 dowel in shoulder flexion exercises. Provided tactile feedback on second head of trapezius bilaterally to discourage compensatory movement which is suspected contributor to patient pain. Pt verbalized recognition of compensatory movement with hand on shoulder tactile cues. No reduction in compensatory movement patterns noted this session due to several trials for pt to acknowledge patterns at all.   Provided pt with tied off length of Theraband to simulate donning and doffing trousers. Pt demonstrated ability to perform simulated hemi dressing techniques with verbal cues for encouragement and procedures. Sit to stand with S for pulling up Theraband "trousers" with Min A for L side donning. Provided pt education pertinent to carryover of skills to dressing tasks with pt verbalizing understanding.   Left pt in [facility] of room with all needs in reach.   Therapy Documentation Precautions:  Precautions Precautions: Fall Restrictions Weight Bearing Restrictions: No General:   Vital Signs:   Pain: Pt c/o pain in L shoulder/arm rated 8/10. RN administered pain medication.  ADL: ADL ADL Comments: refer to functional navigator   See Function Navigator  for Current Functional Status.   Therapy/Group: Individual Therapy  Dierdre Searles 03/31/2016, 12:17 PM

## 2016-03-31 NOTE — Progress Notes (Signed)
Pt is sleeping comfortably at this time.  Room [employer]

## 2016-03-31 NOTE — Progress Notes (Signed)
  Occupational Therapy Session Note  Patient Details  Name: Danielle Vargas MRN: [id number] Date of Birth: 02/13/1951  Today's Date: 03/31/2016 OT Individual Time: 1400-1445 OT Individual Time Calculation (min): 45 min   Short Term Goals: Week 1:  OT Short Term Goal 1 (Week 1): Pt will sit on EOB with S for 5 min to prepare for transfer. OT Short Term Goal 1 - Progress (Week 1): Met OT Short Term Goal 2 (Week 1): Pt will be able to squat pivot transfer to W/c with mod A +2 to prepare for toilet transfers.  OT Short Term Goal 2 - Progress (Week 1): Met OT Short Term Goal 3 (Week 1): Pt will tolerate standing in stedy lift for 2 minutes for clothing change. OT Short Term Goal 3 - Progress (Week 1): Met OT Short Term Goal 4 (Week 1): Pt will be independent with self ROM program for LUE. OT Short Term Goal 4 - Progress (Week 1): Met   Week 2:  OT Short Term Goal 1 (Week 2): STG = LTG due to remaining LOS  Skilled Therapeutic Interventions/Progress Updates: Patient found seated in w/c with no complaints of pain, but comment "but that's why I'm spacy right now". Therapist propelled patient from room to therapy gym. Once in gym, pt transferred w/c to edge of therapy mat using quad cane with close supervision. Seated edge of mat, focused on trunk control/support, NMR to LUE. Pt with complaints of pain during activity, 8/10 in inner LUE (pt reports "it's my ulnar nerve"). Monitored this during session. Stopped this activity and had pt work more on core strengthening to help increase independence with maintaining midline in sitting. Pt transferred edge of mat to w/c with supervision to begin with, then therapist assisted with patient's LOB and difficulty during transfer. Pt sat back on mat, then able to stand up again and transfer to w/c. Therapist propelled pt back to room and left pt seated in w/c with all needs within reach.   Therapy Documentation Precautions:  Precautions Precautions:  Fall Restrictions Weight Bearing Restrictions: No  Vital Signs: Therapy Vitals Temp: 97.8 F (36.6 C) Temp Source: Oral Pulse Rate: 66 Resp: 18 BP: 138/74 Patient Position (if appropriate): Sitting Oxygen Therapy SpO2: 98 % O2 Device: Not Delivered  See Function Navigator for Current Functional Status.  Therapy/Group: Individual Therapy  Chrys Racer , MS, OTR/L, CLT  03/31/2016, 3:40 PM

## 2016-03-31 NOTE — Progress Notes (Signed)
[place] PHYSICAL MEDICINE & REHABILITATION     PROGRESS NOTE  Subjective/Complaints:   Patient without complaints today. She is trying to move her left arm more. She is quite pleased with her progress overall.  ROS: Denies CP, SOB, N/V/D.  Objective: Vital Signs: Blood pressure (!) 115/53, pulse 64, temperature 97.7 F (36.5 C), temperature source Oral, resp. rate 18, height 5\' 1"  (1.549 m), weight 99.4 kg (219 lb 2.2 oz), SpO2 97 %. No results found. No results for input(s): WBC, HGB, HCT, PLT in the last 72 hours. No results for input(s): NA, K, CL, GLUCOSE, BUN, CREATININE, CALCIUM in the last 72 hours.  Invalid input(s): CO CBG (last 3)   Recent Labs  03/30/16 2046 03/31/16 0633 03/31/16 1155  GLUCAP 241* 117* 175*    Wt Readings from Last 3 Encounters:  03/24/16 99.4 kg (219 lb 2.2 oz)  03/13/16 96.9 kg (213 lb 10 oz)  03/04/16 100.2 kg (221 lb)    Physical Exam:  BP (!) 115/53 (BP Location: Right Arm)   Pulse 64   Temp 97.7 F (36.5 C) (Oral)   Resp 18   Ht 5\' 1"  (1.549 m)   Wt 99.4 kg (219 lb 2.2 oz)   SpO2 97%   BMI 41.41 kg/m  Constitutional: She appears well-developed and well-nourished.  HENT: Normocephalic and atraumatic.  Cardiovascular: Normal rate and regular rhythm.  Respiratory: Effort normal and breath sounds normal. No respiratory distress.  GI: Soft. Bowel sounds are normal. She exhibits no distension.  Musculoskeletal: She exhibits no edema or tenderness. No pain with range of motion of the foot or ankle on the left side.  Neurological: She is alert and oriented. No evidence of clonus at the left ankle  Follow commands.  Left facial weakness. Motor: RUE: 5/5 proximal to distal RLE: Hip flexion 4+/5, knee extension 4/5, ankle dorsi/plantar flexion 5/5 LUE: 2 minus /5 left grip, 2 minus, shoulder protraction and retraction, 2 minus, triceps, 0 biceps, Patient is now able to pronate LLE: 3 minus. Knee extension, trace ankle  dorsiflexion Skin: Skin is warm and dry.  Psychiatric:  Mood, affect,brighter extremities show no evidence of edema and no pain with range of motion of the toes of either foot. No erythema Assessment/Plan: 1. 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 Physiatrist is providing close team supervision and 24 hour management of active medical problems listed below. Physiatrist and rehab team continue to assess barriers to discharge/monitor patient progress toward functional and medical goals.  Function:  Bathing Bathing position   Position: Shower  Bathing parts Body parts bathed by patient: Left arm, Chest, Abdomen, Buttocks, Right upper leg, Left upper leg, Front perineal area Body parts bathed by helper: Right arm, Back, Left lower leg, Right lower leg  Bathing assist Assist Level:  (Max A)      Upper Body Dressing/Undressing Upper body dressing   What is the patient wearing?: Pull over shirt/dress     Pull over shirt/dress - Perfomed by patient: Thread/unthread right sleeve, Put head through opening, Pull shirt over trunk Pull over shirt/dress - Perfomed by helper: Thread/unthread left sleeve        Upper body assist Assist Level: Touching or steadying assistance(Pt > 75%)      Lower Body Dressing/Undressing Lower body dressing   What is the patient wearing?: Pants, Shoes, AFO  Pants- Performed by patient: Thread/unthread left pants leg Pants- Performed by helper: Pull pants up/down, Thread/unthread right pants leg   Non-skid slipper socks- Performed by helper: Don/doff right sock, Don/doff left sock       Shoes - Performed by helper: Don/doff right shoe, Don/doff left shoe, Fasten right, Fasten left   AFO - Performed by helper: Don/doff left AFO      Lower body assist Assist for lower body dressing:   (modA)      Toileting Toileting   Toileting steps completed by patient: Performs perineal hygiene Toileting steps completed by helper: Adjust clothing prior to toileting, Performs perineal hygiene, Adjust clothing after toileting Toileting Assistive Devices: Grab bar or rail  Toileting assist Assist level:  (totalA)   Transfers Chair/bed transfer   Chair/bed transfer method: Stand pivot Chair/bed transfer assist level: Touching or steadying assistance (Pt > 75%) Chair/bed transfer assistive device: Environmental manager lift: Ecologist Ambulation activity did not occur: Refused   Max distance: 44 Assist level: Touching or steadying assistance (Pt > 75%)   Wheelchair   Type: Manual Max wheelchair distance: 63ft  Assist Level: Maximal assistance (Pt 25 - 49%)  Cognition Comprehension Comprehension assist level: Follows basic conversation/direction with no assist  Expression Expression assist level: Expresses basic needs/ideas: With no assist  Social Interaction Social Interaction assist level: Interacts appropriately 90% of the time - Needs monitoring or encouragement for participation or interaction.  Problem Solving Problem solving assist level: Solves basic 75 - 89% of the time/requires cueing 10 - 24% of the time  Memory Memory assist level: Recognizes or recalls 90% of the time/requires cueing < 10% of the time    Medical Problem List and Plan: 1. 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 Status post loop recorder  Cont CIR, PT, OT, speech therapy,     2. DVT Prophylaxis/Anticoagulation: SCDs. Monitor for any signs of DVT. Venous Doppler study negative 3. Pain Management: Neurontin 300 mg 3 times a day, Topamax 50 mg twice a day, hydrocodone as needed, lateral epicondylitis right elbow,Voltaren gel, We'll start when necessary, Zanaflex for spasms. 4. Diabetes mellitus of peripheral  neuropathy. Hemoglobin A1c 7.3.   Lantus insulin increased to47 units twice a day on 7/13-, 7/17 increase to 50U BID, improved a.m. CBG  NovoLog 15 units 3 times a day, increased to 20U on 7/9.  Check blood sugars before meals and at bedtime. Diabetic teaching, No change since diabetic management.    CBG (last 3)   Recent Labs  03/30/16 2046 03/31/16 0633 03/31/16 1155  GLUCAP 241* 117* 175*    5. Neuropsych: This patient is capable of making decisions on her own behalf. 6. Skin/Wound Care: Routine skin checks 7. Fluids/Electrolytes/Nutrition: Routine I&O 8. Hypertension. Coreg 6.25 mg twice a day, heart rate is in a good range.Currently in a good range  Vitals:   03/31/16 0608 03/31/16 1403  BP: 138/74 (!) 115/53  Pulse: 66 64  Resp: 18 18  Temp: 97.8 F (36.6 C) 97.7 F (36.5 C)   9. Hypothyroidism. Synthroid 10. Hyperlipidemia. Crestor, fenofibrate 11. Asthma. Singulair 10 mg daily, Dulera 2 puffs twice daily, albuterol nebulizer as needed 12. Morbid Obesity. Body mass index is 40.39 kg/(m^2)., Diet and exercise education, cont to encourage weight loss to increase endurance and promote overall health. Dietary follow-up   13. Obesity hypoventilation syndrome: CPAP, Patient states she uses it regularly at home, but not regularly. In  the hospital 15. Psychosocial, patient likely going to SNF post discharge LOS (Days) [place] E 03/31/2016 2:48 PM

## 2016-03-31 NOTE — Progress Notes (Signed)
Speech Language Pathology Daily Session Note  Patient Details  Name: Danielle Vargas MRN: [id number] Date of Birth: Jul 13, 1951  Today's Date: 03/31/2016 SLP Individual Time: 1300-1345 SLP Individual Time Calculation (min): 45 min   Short Term Goals: Week 2: SLP Short Term Goal 1 (Week 2): Pt will attend to her left upper extremity during semi-complex functional tasks with supervision verbal cues.   SLP Short Term Goal 2 (Week 2): Pt will utilize external memory aids to facilitate recall of information with mod I verbal cues.   SLP Short Term Goal 3 (Week 2): Pt will complete semi-complex novel tasks with supervision verbal cues for functional problem solving.   SLP Short Term Goal 4 (Week 2): Pt will return demonstration of at least 2 safety precautions with supervision verbal cues.   SLP Short Term Goal 5 (Week 2): Pt will selectively attend to semi-complex tasks for 15 minute intervals in a moderately distracting environment with supervision verbal cues for redirection.   SLP Short Term Goal 6 (Week 2): Pt will identify 2 deficits s/p CVA and their impact on her functional independence with min assist question cues.    Skilled Therapeutic Interventions:   Skilled treatment session focused on addressing cognition goals. SLP facilitated session by providing a mildly distracting environment and set-up of a novel semi-complex deductive reasoning task, which patient completed with extra time and Min verbal cues to identify errors.  Patient required no cues for redirection back to task for a period of 15 minutes.  Continue with current plan of care.   Function:  Eating Eating   Modified Consistency Diet: No Eating Assist Level: More than reasonable amount of time;Set up assist for   Eating Set Up Assist For: Cutting food;Opening containers       Cognition Comprehension Comprehension assist level: Follows basic conversation/direction with no assist  Expression   Expression assist level:  Expresses basic needs/ideas: With no assist  Social Interaction Social Interaction assist level: Interacts appropriately 90% of the time - Needs monitoring or encouragement for participation or interaction.  Problem Solving Problem solving assist level: Solves basic 90% of the time/requires cueing < 10% of the time  Memory Memory assist level: Recognizes or recalls 90% of the time/requires cueing < 10% of the time    Pain Pain Assessment Pain Assessment: No/denies pain Pain Score: Asleep  Therapy/Group: Individual Therapy  Carmelia Roller., CCC-SLP [id number]  [place] 03/31/2016, 5:06 PM

## 2016-04-01 ENCOUNTER — Inpatient Hospital Stay (HOSPITAL_COMMUNITY): Payer: Medicare Other | Admitting: Speech Pathology

## 2016-04-01 ENCOUNTER — Inpatient Hospital Stay (HOSPITAL_COMMUNITY): Payer: Medicare Other

## 2016-04-01 ENCOUNTER — Inpatient Hospital Stay (HOSPITAL_COMMUNITY): Payer: Medicare Other | Admitting: Occupational Therapy

## 2016-04-01 ENCOUNTER — Inpatient Hospital Stay (HOSPITAL_COMMUNITY): Payer: Medicare Other | Admitting: Physical Therapy

## 2016-04-01 DIAGNOSIS — M7071 Other bursitis of hip, right hip: Secondary | ICD-10-CM

## 2016-04-01 LAB — GLUCOSE, CAPILLARY
GLUCOSE-CAPILLARY: 135 mg/dL — AB (ref 65–99)
GLUCOSE-CAPILLARY: 164 mg/dL — AB (ref 65–99)
Glucose-Capillary: 107 mg/dL — ABNORMAL HIGH (ref 65–99)
Glucose-Capillary: 232 mg/dL — ABNORMAL HIGH (ref 65–99)

## 2016-04-01 MED ORDER — INSULIN ASPART 100 UNIT/ML ~~LOC~~ SOLN: 20.0000 [IU] | Freq: Three times a day (TID) | SUBCUTANEOUS | 11 refills | Status: DC

## 2016-04-01 MED ORDER — MUSCLE RUB 10-15 % EX CREA: 1.0000 "application " | TOPICAL_CREAM | Freq: Two times a day (BID) | CUTANEOUS | 0 refills | Status: DC | PRN

## 2016-04-01 MED ORDER — HYDROCODONE-ACETAMINOPHEN 5-325 MG PO TABS: 1.0000 | ORAL_TABLET | Freq: Three times a day (TID) | ORAL | 0 refills | Status: DC | PRN

## 2016-04-01 MED ORDER — CARVEDILOL 6.25 MG PO TABS: 6.2500 mg | ORAL_TABLET | Freq: Two times a day (BID) | ORAL | Status: DC

## 2016-04-01 MED ORDER — ROSUVASTATIN CALCIUM 20 MG PO TABS: 20.0000 mg | ORAL_TABLET | Freq: Every day | ORAL | Status: DC

## 2016-04-01 MED ORDER — FLUOXETINE HCL 10 MG PO CAPS: 10.0000 mg | ORAL_CAPSULE | Freq: Every day | ORAL | 3 refills | Status: DC

## 2016-04-01 MED ORDER — ACETAMINOPHEN 325 MG PO TABS: 650.0000 mg | ORAL_TABLET | Freq: Four times a day (QID) | ORAL | Status: DC | PRN

## 2016-04-01 MED ORDER — DICLOFENAC SODIUM 1 % TD GEL: 2.0000 g | Freq: Four times a day (QID) | TRANSDERMAL | Status: DC

## 2016-04-01 MED ORDER — LEVOTHYROXINE SODIUM 100 MCG PO TABS: 100.0000 ug | ORAL_TABLET | Freq: Every day | ORAL | Status: AC

## 2016-04-01 MED ORDER — FUROSEMIDE 20 MG PO TABS: 10.0000 mg | ORAL_TABLET | Freq: Every day | ORAL | Status: DC

## 2016-04-01 MED ORDER — MUSCLE RUB 10-15 % EX CREA
TOPICAL_CREAM | Freq: Two times a day (BID) | CUTANEOUS | Status: DC | PRN
  Filled 2016-04-01: qty 85

## 2016-04-01 MED ORDER — INSULIN GLARGINE 100 UNIT/ML ~~LOC~~ SOLN: 50.0000 [IU] | Freq: Two times a day (BID) | SUBCUTANEOUS | 11 refills | Status: DC

## 2016-04-01 MED ORDER — PANTOPRAZOLE SODIUM 40 MG PO TBEC: 40.0000 mg | DELAYED_RELEASE_TABLET | Freq: Every day | ORAL | Status: AC

## 2016-04-01 MED ORDER — MOMETASONE FURO-FORMOTEROL FUM 200-5 MCG/ACT IN AERO: 2.0000 | INHALATION_SPRAY | Freq: Two times a day (BID) | RESPIRATORY_TRACT | Status: DC

## 2016-04-01 MED ORDER — TROLAMINE SALICYLATE 10 % EX CREA
TOPICAL_CREAM | Freq: Two times a day (BID) | CUTANEOUS | Status: DC | PRN
  Filled 2016-04-01: qty 85

## 2016-04-01 MED ORDER — DOCUSATE SODIUM 100 MG PO CAPS: 100.0000 mg | ORAL_CAPSULE | Freq: Two times a day (BID) | ORAL | 0 refills | Status: DC

## 2016-04-01 NOTE — Progress Notes (Signed)
Occupational Therapy Discharge Summary  Patient Details  Name: Danielle Vargas MRN: [id number] Date of Birth: Jun 13, 1951  Today's Date: 04/01/2016 OT Individual Time: 7408-1448 OT Individual Time Calculation (min): 60 min   Patient has met 12 of 13 long term goals due to improved activity tolerance, improved balance and postural control.  Patient to discharge at overall Mod Assist level.  Patient's care partner unavailable to provide the necessary physical and cognitive assistance at discharge.    Reasons goals not met: LB dressing at Mod A precluded due to challenges of body habitus and coordination issues with RUE using AD to don trousers.   Recommendation:  Patient will benefit from ongoing skilled OT services in skilled nursing facility setting to continue to advance functional skills in the area of BADL, iADL and Reduce care partner burden.  Equipment: No equipment provided  Reasons for discharge: discharge from hospital  Patient/family agrees with progress made and goals achieved: Yes  OT Discharge Precautions/Restrictions Precautions Precautions: Fall Restrictions Weight Bearing Restrictions: No General   Vital Signs Therapy Vitals Temp: 98.6 F (37 C) Temp Source: Oral Pulse Rate: 71 Resp: 16 BP: (!) 168/58 Patient Position (if appropriate): Sitting Oxygen Therapy SpO2: 97 % O2 Device: Not Delivered Pain Pain Assessment Pain Score: 4  ADL ADL ADL Comments: refer to functional navigator Vision/Perception  Vision- History Baseline Vision/History: Wears glasses Wears Glasses: At all times;Reading only Patient Visual Report: No change from baseline Vision- Assessment Vision Assessment?: No apparent visual deficits;Yes Eye Alignment: Within Functional Limits Ocular Range of Motion: Within Functional Limits Alignment/Gaze Preference: Within Defined Limits Tracking/Visual Pursuits: Able to track stimulus in all quads without difficulty Saccades: Within  functional limits Convergence: Within functional limits  Cognition Overall Cognitive Status: Impaired/Different from baseline Arousal/Alertness: Lethargic Orientation Level: Oriented X4 Attention: Selective Selective Attention: Impaired Selective Attention Impairment: Functional basic;Verbal basic Memory: Impaired Memory Impairment: Decreased recall of new information Awareness: Impaired Awareness Impairment: Emergent impairment Problem Solving: Impaired Problem Solving Impairment: Functional basic Executive Function: Initiating;Self Monitoring;Self Correcting Initiating: Impaired Initiating Impairment: Functional basic Self Monitoring: Impaired Self Monitoring Impairment: Functional basic Self Correcting: Impaired Self Correcting Impairment: Functional basic Behaviors: Poor frustration tolerance Safety/Judgment: Impaired Sensation Sensation Light Touch: Not tested Stereognosis: Not tested Hot/Cold: Appears Intact Proprioception: Not tested Coordination Gross Motor Movements are Fluid and Coordinated: No Fine Motor Movements are Fluid and Coordinated: No Coordination and Movement Description: L hemipleiga  Finger Nose Finger Test: not tested  9 Hole Peg Test: not tested  Motor  Motor Motor: Hemiplegia Motor - Skilled Clinical Observations: Slight movement in L shoulder and L ankle/foot. Emerging elbow flexion, shoulder abduction, should flexion, and internal rotation. Nearly full range at the knee and trace amounts of hip extension.  Motor - Discharge Observations: L hemiparesis  Mobility  Bed Mobility Bed Mobility: Supine to Sit;Sit to Supine Rolling Right: 2: Max assist Rolling Right Details: Verbal cues for sequencing;Verbal cues for technique;Manual facilitation for placement;Manual facilitation for weight shifting;Tactile cues for placement;Tactile cues for weight shifting Rolling Left: 3: Mod assist Rolling Left Details: Verbal cues for sequencing;Verbal cues for  technique;Manual facilitation for placement;Tactile cues for placement;Verbal cues for precautions/safety;Manual facilitation for weight shifting  Trunk/Postural Assessment  Postural Control Trunk Control: Slight R lean in sitting.  Righting Reactions: Delayed to L side.   Balance   Extremity/Trunk Assessment RUE Assessment RUE Assessment: Within Functional Limits LUE Assessment LUE Assessment: Exceptions to [facility] (2+/5 shoulder, 2-/5 elbow, 0/5 wrist and hand)   See Function Navigator for Current Functional  Status.  Dierdre Searles 04/01/2016, 4:28 PM

## 2016-04-01 NOTE — Progress Notes (Signed)
Social Work Patient ID: Danielle Vargas, female   DOB: 1951-06-06, 66 y.o.   MRN: [id number]   Bed offered from [facility] which is pt's and daughter's first choice. Plan for transfer tomorrow. Daughter to contact and set up time for paperwork.  Work on Quarry manager.

## 2016-04-01 NOTE — Progress Notes (Addendum)
[place] [facility]     PROGRESS NOTE  Subjective/Complaints:   Patient without complaints today. She is trying to move her left arm more. She is quite pleased with her progress overall.  ROS: Denies CP, SOB, N/V/D.  Objective: Vital Signs: Blood pressure (!) 150/60, pulse 64, temperature 98.1 F (36.7 C), temperature source Oral, resp. rate 18, height 5\' 1"  (1.549 m), weight 99.4 kg (219 lb 2.2 oz), SpO2 97 %. No results found. No results for input(s): WBC, HGB, HCT, PLT in the last 72 hours. No results for input(s): NA, K, CL, GLUCOSE, BUN, CREATININE, CALCIUM in the last 72 hours.  Invalid input(s): CO CBG (last 3)   Recent Labs  03/31/16 1639 03/31/16 2047 04/01/16 0640  GLUCAP 73 229* 107*    Wt Readings from Last 3 Encounters:  03/24/16 99.4 kg (219 lb 2.2 oz)  03/13/16 96.9 kg (213 lb 10 oz)  03/04/16 100.2 kg (221 lb)    Physical Exam:  BP (!) 150/60   Pulse 64   Temp 98.1 F (36.7 C) (Oral)   Resp 18   Ht 5\' 1"  (1.549 m)   Wt 99.4 kg (219 lb 2.2 oz)   SpO2 97%   BMI 41.41 kg/m  Constitutional: She appears well-developed and well-nourished.  HENT: Normocephalic and atraumatic.  Cardiovascular: Normal rate and regular rhythm.  Respiratory: Effort normal and breath sounds normal. No respiratory distress.  GI: Soft. Bowel sounds are normal. She exhibits no distension.  Musculoskeletal: She exhibits no edema or tenderness. No pain with range of motion of the foot or ankle on the left side.  Neurological: She is alert and oriented. No evidence of clonus at the left ankle  Follow commands.  Left facial weakness. Motor: RUE: 5/5 proximal to distal RLE: Hip flexion 4+/5, knee extension 4/5, ankle dorsi/plantar flexion 5/5 LUE: 2 minus /5 left grip, 2 minus, shoulder protraction and retraction, 2 minus, triceps, 0 biceps, Patient is now able to pronate LLE: 3 minus. Knee extension, trace ankle dorsiflexion Skin: Skin is warm and  dry.  Psychiatric:  Mood, affect,brighter extremities show no evidence of edema and no pain with range of motion of the toes of either foot. No erythema Assessment/Plan: 1. 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 Physiatrist is providing close team supervision and 24 hour management of active medical problems listed below. Physiatrist and rehab team continue to assess barriers to discharge/monitor patient progress toward functional and medical goals.  Function:  Bathing Bathing position   Position: Shower  Bathing parts Body parts bathed by patient: Left arm, Chest, Abdomen, Buttocks, Right upper leg, Left upper leg, Front perineal area Body parts bathed by helper: Right arm, Back, Left lower leg, Right lower leg  Bathing assist Assist Level:  (Max A)      Upper Body Dressing/Undressing Upper body dressing   What is the patient wearing?: Pull over shirt/dress     Pull over shirt/dress - Perfomed by patient: Thread/unthread right sleeve, Put head through opening, Pull shirt over trunk Pull over shirt/dress - Perfomed by helper: Thread/unthread left sleeve        Upper body assist Assist Level: Touching or steadying assistance(Pt > 75%)      Lower Body Dressing/Undressing Lower body dressing   What is the patient wearing?: Pants, Shoes, AFO     Pants- Performed by patient:  Thread/unthread left pants leg Pants- Performed by helper: Pull pants up/down, Thread/unthread right pants leg   Non-skid slipper socks- Performed by helper: Don/doff right sock, Don/doff left sock       Shoes - Performed by helper: Don/doff right shoe, Don/doff left shoe, Fasten right, Fasten left   AFO - Performed by helper: Don/doff left AFO      Lower body assist Assist for lower body dressing:  (modA)      Toileting Toileting    Toileting steps completed by patient: Performs perineal hygiene Toileting steps completed by helper: Adjust clothing prior to toileting, Performs perineal hygiene, Adjust clothing after toileting Toileting Assistive Devices: Grab bar or rail  Toileting assist Assist level: Touching or steadying assistance (Pt.75%)   Transfers Chair/bed transfer   Chair/bed transfer method: Stand pivot Chair/bed transfer assist level: Touching or steadying assistance (Pt > 75%) Chair/bed transfer assistive device: Armrests, Walker Mechanical lift: Ecologist Ambulation activity did not occur: Refused   Max distance: 44 Assist level: Touching or steadying assistance (Pt > 75%)   Wheelchair   Type: Manual Max wheelchair distance: 59ft  Assist Level: Maximal assistance (Pt 25 - 49%)  Cognition Comprehension Comprehension assist level: Follows basic conversation/direction with no assist  Expression Expression assist level: Expresses basic needs/ideas: With no assist  Social Interaction Social Interaction assist level: Interacts appropriately 90% of the time - Needs monitoring or encouragement for participation or interaction.  Problem Solving Problem solving assist level: Solves basic 90% of the time/requires cueing < 10% of the time  Memory Memory assist level: Recognizes or recalls 90% of the time/requires cueing < 10% of the time    Medical Problem List and Plan: 1. 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 Status post loop recorder  Cont CIR, PT, OT, speech therapy,     2. DVT Prophylaxis/Anticoagulation: SCDs. Monitor for any signs of DVT. Venous Doppler study negative 3. Pain Management: Neurontin 300 mg 3 times a day, Topamax 50 mg twice a day, hydrocodone as needed,right hip bursitis, avoid sidelying , trial sportscream Voltaren gel, We'll start when necessary, Zanaflex for spasms. 4. Diabetes mellitus of  peripheral neuropathy. Hemoglobin A1c 7.3.   Lantus insulin increased to47 units twice a day on 7/13-, 7/17 increase to 50U BID, improved a.m. CBG  NovoLog 15 units 3 times a day, increased to 20U on 7/9.  Check blood sugars before meals and at bedtime. Diabetic teaching, No change since diabetic management.    CBG (last 3) Improving  Recent Labs  03/31/16 1639 03/31/16 2047 04/01/16 0640  GLUCAP 73 229* 107*    5. Neuropsych: This patient is capable of making decisions on her own behalf. 6. Skin/Wound Care: Routine skin checks 7. Fluids/Electrolytes/Nutrition: Routine I&O 8. Hypertension. Coreg 6.25 mg twice a day, heart rate is in a good range.Currently in a good range, cont Coreg current dose  Vitals:   04/01/16 0533 04/01/16 0833  BP: (!) 148/62 (!) 150/60  Pulse: 62 64  Resp: 18   Temp: 98.1 F (36.7 C)    9. Hypothyroidism. Synthroid 10. Hyperlipidemia. Crestor, fenofibrate 11. Asthma. Singulair 10 mg daily, Dulera 2 puffs twice daily, albuterol nebulizer as needed 12. Morbid Obesity. Body mass index is 40.39 kg/(m^2)., Diet and exercise education, cont to encourage weight loss to increase endurance and promote overall health. Dietary follow-up   13. Obesity hypoventilation syndrome: CPAP, Patient states she uses it regularly at home, but  not regularly. In the hospital 15. Psychosocial, patient likely going to SNF post discharge LOS (Days) 18 A FACE TO FACE EVALUATION WAS PERFORMED  Charlett Blake 04/01/2016 8:46 AM

## 2016-04-01 NOTE — Progress Notes (Signed)
Occupational Therapy Session Note  Patient Details  Name: Danielle Vargas MRN: [id number] Date of Birth: 1951-06-29  Today's Date: 04/01/2016 OT Individual Time: [id number] OT Individual Time Calculation (min): 60 min     Short Term Goals: Week 2:  OT Short Term Goal 1 (Week 2): STG = LTG due to remaining LOS  Skilled Therapeutic Interventions/Progress Updates:    Treatment session with focus on self care skills. Pt completed stand pivot transfer WC <> shower bench with Min A for weight shifting. Bathing completed at Mod A for LB tasks. Pt demonstrates increased mobility at seated in shower as evidenced by improved lateral leans and abdominal weight shifts/lifts for clinician-assisted bathing tasks. Bed mobility continues to improve with pt requiring less assist rolling L to R. However, pt demonstrates tendency to hold on to bed rail with RUE during lying to sitting for which Mod A to Max A is required from clinician to assist pt in assuming upright, seated position.   Transferred to bed for LB drying, donning adult incontinence brief, and LB dressing. Pt completed UB dressing seated at EOB with verbal cue/encouragement to persist in efforts for pt to complete task. Pt responded positively to encouragement and was successful in task. LB dressing completed at Max A with pt seated at EOB. Pt demonstrates trouble maintaining upright seated posture at EOB which precludes LB dressing with or without AD at EOB. Continue to problem solve LB dressing strategies with pt seated in WC or other supportive surface.   Left pt in room in front of mirror to complete hair brushing and oral hygiene. All needs in reach.   Therapy Documentation Precautions:  Precautions Precautions: Fall Restrictions Weight Bearing Restrictions: No General: General PT Missed Treatment Reason: Patient fatigue;Pain Vital Signs: Therapy Vitals Temp: 98.6 F (37 C) Temp Source: Oral Pulse Rate: 71 Resp: 16 BP: (!)  168/58 Patient Position (if appropriate): Sitting Oxygen Therapy SpO2: 97 % O2 Device: Not Delivered Pain: Pain Assessment Pain Assessment: 0-10 Pain Score: 4  Pain Type: Acute pain Pain Location: Hip Pain Orientation: Right Pain Descriptors / Indicators: Aching Pain Frequency: Intermittent Pain Onset: Gradual Patients Stated Pain Goal: 3 Pain Intervention(s): RN made aware Multiple Pain Sites: No ADL: ADL ADL Comments: refer to functional navigator Exercises:   Other Treatments:    See Function Navigator for Current Functional Status.   Therapy/Group: Individual Therapy  Dierdre Searles 04/01/2016, 2:50 PM

## 2016-04-01 NOTE — Discharge Summary (Signed)
Physician Discharge Summary  Patient ID: Danielle Vargas MRN: [id number] DOB/AGE: 01/13/1951 65 y.o.  Admit date: 03/14/2016 Discharge date: 04/02/2016  Discharge Diagnoses:  Principal Problem:   Right middle cerebral artery stroke [facility]) Active Problems:   HTN (hypertension)   Obesity hypoventilation syndrome (HCC)   Asthma   DM type 2 with diabetic peripheral neuropathy [facility])   Vascular headache   Bursitis of right hip   Discharged Condition:  Stable   Significant Diagnostic Studies:  Labs:  Basic Metabolic Panel: BMP Latest Ref Rng & Units 03/17/2016 03/13/2016 03/11/2016  Glucose 65 - 99 mg/dL 187(H) 155(H) 203(H)  BUN 6 - 20 mg/dL 19 14 20   Creatinine 0.44 - 1.00 mg/dL 0.94 0.85 1.07(H)  Sodium 135 - 145 mmol/L 138 141 139  Potassium 3.5 - 5.1 mmol/L 3.9 3.7 3.8  Chloride 101 - 111 mmol/L 110 112(H) 110  CO2 22 - 32 mmol/L 22 20(L) 21(L)  Calcium 8.9 - 10.3 mg/dL 9.2 9.2 9.2    CBC: CBC Latest Ref Rng & Units 03/17/2016 03/11/2016 03/10/2016  WBC 4.0 - 10.5 K/uL 6.3 7.2 9.7  Hemoglobin 12.0 - 15.0 g/dL 11.9(L) 12.2 12.1  Hematocrit 36.0 - 46.0 % 37.3 39.0 38.5  Platelets 150 - 400 K/uL 239 272 293    CBG:  Recent Labs Lab 04/01/16 0640 04/01/16 1152 04/01/16 1607 04/01/16 2127 04/02/16 0725  GLUCAP 107* 232* 135* 164* 102*    Today's Vitals   04/02/16 0345 04/02/16 0445 04/02/16 0520 04/02/16 0950  BP:   (!) 162/67   Pulse:   77   Resp:   19   Temp:   97.9 F (36.6 C)   TempSrc:   Oral   SpO2:   98%   Weight:      Height:      PainSc: 9  Asleep  9     Brief HPI:   Danielle Vargas is a 65 y.o. right handed female with history of hypertension, obesity,Diabetes mellitus, hyperlipidemia and left rotator cuff repair.  She was admitted on  03/10/2016 with left-sided weakness after a fall from bed. She denied any loss of consciousness MRI of the brain showed extensive areas of both focal and confluent restricted diffusion throughout the right MCA distribution. CTA  suspect right M1-MCA long segment stenosis or occlusion. 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 On the afternoon of 03/11/2016 with increased increased right-sided weakness and slurred speech. Follow-up CT of the head showed no evidence of new infarct . MRI showed mild extension of acute right MCA infarct as compared to 03/10/2016. Neurology  recommended to contining Plavix. TEE 03/12/2016 showing no cardiac source of embolism. LVH LAD dilation present as well as placement of loop recorder.   Therapy evaluations done and CIR was recommended for follow up therapy.   Hospital Course: Danielle Vargas was admitted to rehab 03/14/2016 for inpatient therapies to consist of PT, ST and OT at least three hours five days a week. Past admission physiatrist, therapy team and rehab RN have worked together to provide customized collaborative inpatient rehab. Blood pressures were monitored bid and have started to trend upwards. She will need further titration of medications after discharge. Diabetes has been monitored with ac/hs checks and lantus was titrated upwards for tighter control. Meal coverage was increased to 20 units TID with SSI additionally as needed.  Her respiratory status has been stable on Singulair and Dulera but she has been non-compliant with CPAP use during her stay.  She  has had complaints of right elbow pain due to lateral epicondylitis and Voltaren gel was added on qid basis to help with symptoms.  Neuropathy has been managed on home regimen.  She was fitted with AFO to help with left foot drop and gait quality. She currently requires mod assist to supervision and family is unable to provide care needed. She was discharged to [facility] for further therapies.    Rehab course: During patient's stay in rehab weekly team conferences were held to monitor patient's progress, set goals and discuss barriers to discharge. At admission, patient required total assist with self care  tasks and max assist with mobility. Cognitive evaluation revealed mild to moderate cognitive deficits. She  has had improvement in activity tolerance, balance, coordination and attention. postural control, as well as ability to compensate for deficits. She is has had improvement in functional use LUE  and LLE as well as improved awareness. She requires supervision for bathing, min assist for UB dressing and is able LB dressing with max assist. She requires supervision for transfers and is able to ambulate short distances 46' with hemi walker and supervision. She requires min-guard assist to navigate stairs.  She is able to recall day new information with external aids and is able to complete complex tasks with extra time to identify and correct errors   independently.    Disposition:  Skilled Nursing facility  Diet:  Heart healthy. Carb Modified medium.   Special Instructions: 1. Recheck BMET and CBC in 3-5 days. 2. Check BS ac/hs and use SSI  Per protocol    Discharge Instructions    Ambulatory referral to Physical Medicine Rehab    Complete by:  As directed   2-3 weeks follow up--Going to [facility]        Medication List    STOP taking these medications   budesonide-formoterol 160-4.5 MCG/ACT inhaler Commonly known as:  SYMBICORT Replaced by:  mometasone-formoterol 200-5 MCG/ACT Aero   calcium citrate-vitamin D 315-200 MG-UNIT tablet Commonly known as:  CITRACAL+D   Co Q 10 100 MG Caps   HYDROcodone-acetaminophen 10-325 MG tablet Commonly known as:  NORCO Replaced by:  HYDROcodone-acetaminophen 5-325 MG tablet   insulin regular 100 units/mL injection Commonly known as:  HUMULIN R   INSULIN SYRINGE 1CC/31GX5/16" 31G X 5/16" 1 ML Misc   metFORMIN 1000 MG tablet Commonly known as:  GLUCOPHAGE   omeprazole 20 MG capsule Commonly known as:  PRILOSEC   Vitamin D (Ergocalciferol) 50000 units Caps capsule Commonly known as:  DRISDOL     TAKE these medications    acetaminophen 325 MG tablet Commonly known as:  TYLENOL Take 2 tablets (650 mg total) by mouth every 6 (six) hours as needed for mild pain (or temp >/= 99.5 F).   albuterol 108 (90 Base) MCG/ACT inhaler Commonly known as:  PROVENTIL HFA;VENTOLIN HFA Inhale 2 puffs into the lungs every 6 (six) hours as needed for wheezing or shortness of breath.   carvedilol 6.25 MG tablet Commonly known as:  COREG Take 1 tablet (6.25 mg total) by mouth 2 (two) times daily with a meal. What changed:  medication strength  how much to take   clopidogrel 75 MG tablet Commonly known as:  PLAVIX Take 1 tablet (75 mg total) by mouth daily.   diclofenac sodium 1 % Gel Commonly known as:  VOLTAREN Apply 2 g topically 4 (four) times daily. Notes to patient:  To right elbow--lateral epicondyle.    docusate sodium 100 MG capsule  Commonly known as:  COLACE Take 1 capsule (100 mg total) by mouth 2 (two) times daily.   fenofibrate 160 MG tablet Take 160 mg by mouth at bedtime.   FLUoxetine 10 MG capsule Commonly known as:  PROZAC Take 1 capsule (10 mg total) by mouth daily.   furosemide 20 MG tablet Commonly known as:  LASIX Take 0.5 tablets (10 mg total) by mouth daily. What changed:  how much to take  when to take this   gabapentin 300 MG capsule Commonly known as:  NEURONTIN Take 300 mg by mouth 3 (three) times daily.   HYDROcodone-acetaminophen 5-325 MG tablet--Rx # 10 pills  Commonly known as:  NORCO/VICODIN Take 1 tablet by mouth every 8 (eight) hours as needed for severe pain. Replaces:  HYDROcodone-acetaminophen 10-325 MG tablet   insulin aspart 100 UNIT/ML injection Commonly known as:  novoLOG Inject 20 Units into the skin 3 (three) times daily with meals.   insulin glargine 100 UNIT/ML injection Commonly known as:  LANTUS Inject 0.5 mLs (50 Units total) into the skin 2 (two) times daily. What changed:  how much to take  how to take this  when to take this  additional  instructions   levothyroxine 100 MCG tablet Commonly known as:  SYNTHROID, LEVOTHROID Take 1 tablet (100 mcg total) by mouth daily before breakfast. What changed:  medication strength  how much to take  when to take this   metoCLOPramide 10 MG tablet Commonly known as:  REGLAN Take 10 mg by mouth 4 (four) times daily.   mometasone 50 MCG/ACT nasal spray Commonly known as:  NASONEX Place 2 sprays into the nose daily. What changed:  when to take this  reasons to take this   mometasone-formoterol 200-5 MCG/ACT Aero Commonly known as:  DULERA Inhale 2 puffs into the lungs 2 (two) times daily. Replaces:  budesonide-formoterol 160-4.5 MCG/ACT inhaler   montelukast 10 MG tablet Commonly known as:  SINGULAIR Take 10 mg by mouth at bedtime.   multivitamin with minerals Tabs tablet Take 1 tablet by mouth daily. One A Day 50+   MUSCLE RUB 10-15 % Crea Apply 1 application topically 2 (two) times daily as needed for muscle pain. For right hip pain   nitroGLYCERIN 0.4 MG SL tablet Commonly known as:  NITROSTAT Place 1 tablet (0.4 mg total) under the tongue every 5 (five) minutes as needed for chest pain.   pantoprazole 40 MG tablet Commonly known as:  PROTONIX Take 1 tablet (40 mg total) by mouth daily.   polyethylene glycol packet Commonly known as:  MIRALAX / GLYCOLAX Take 17 g by mouth every other day.   rosuvastatin 20 MG tablet Commonly known as:  CRESTOR Take 1 tablet (20 mg total) by mouth daily. What changed:  See the new instructions.   tiZANidine 2 MG tablet Commonly known as:  ZANAFLEX Take 2 mg by mouth every 8 (eight) hours as needed for muscle spasms.   topiramate 50 MG tablet Commonly known as:  TOPAMAX Take 50 mg by mouth 2 (two) times daily.       Contact information for follow-up providers    Charlett Blake, MD.   Specialty:  Physical Medicine and Rehabilitation Why:  Office to call for appointment Contact information: [place] [address] [phone]        Rosalin Hawking, MD.   Specialty:  Neurology Why:  Call for appointment 1 month Contact information: [address] [place] [address]  [phone]            Contact information for after-discharge care    Destination    [facility] .   Specialty:  [place] information: [address] [place] [phone]                  Signed: Bary Leriche 04/02/2016, 10:48 AM

## 2016-04-01 NOTE — Progress Notes (Signed)
Speech Language Pathology Discharge Summary and Final Treatment Note  Patient Details  Name: Danielle Vargas MRN: [id number] Date of Birth: [birth year]  Today's Date: 04/01/2016 SLP Individual Time: 1330-1400 SLP Individual Time Calculation (min): 30 min    Skilled Therapeutic Interventions:   Skilled treatment session focused on addressing diagnostic treatment of cognition goals. SLP facilitated session by administering the [facility] Cognitive Assessment, 7.2.  Patient required increasd wait time and demonstrated skills consistent with a score of 28/30 with 26 or greater being considered WNL.  Patient Mod I with recall of new information with use of external aids and was able to problem solve complex tasks with extra time to identify and correct errors. Fatigue is likely a limiting factor and appeared to impact performance during yesterday's session.       Patient has met 4 of 4 long term goals.  Patient to discharge at overall Modified Independent level.  Reasons goals not met: n/a   Clinical Impression/Discharge Summary:    Patient has made functional gains during this rehab admission and has met 4 out of 4 long term goals due to improved functional abilities.  Patient is currently Mod I for cognitive-linguistic tasks.  Patient education has been completed and patient will discharge to a SNF with 24 hour supervision. No further skilled SLP services are warranted at this time.    Care Partner:  Caregiver Able to Provide Assistance: No  Type of Caregiver Assistance: Physical  Recommendation:  None     Equipment: none   Reasons for discharge: Treatment goals met;Discharged from hospital   Patient/Family Agrees with Progress Made and Goals Achieved: Yes   Function:  Cognition Comprehension Comprehension assist level: Follows complex conversation/direction with extra time/assistive device  Expression   Expression assist level: Expresses complex ideas: With extra time/assistive device   Social Interaction Social Interaction assist level: Interacts appropriately 90% of the time - Needs monitoring or encouragement for participation or interaction.  Problem Solving Problem solving assist level: Solves complex problems: With extra time  Memory Memory assist level: Assistive device: No helper   Carmelia Roller., CCC-SLP [phone]  Danielle Vargas 04/01/2016, 4:42 PM

## 2016-04-01 NOTE — Progress Notes (Signed)
Occupational Therapy Note  Patient Details  Name: Danielle Vargas MRN: [id number] Date of Birth: 01/24/1951  Today's Date: 04/01/2016 OT Individual Time: 1000-1100 OT Individual Time Calculation (min): 60 min    Pt c/o 9/10 pain in right hip and left shoulder; RN aware Individual Therapy  Pt sitting in w/c upon arrival.  Discussed plans for therapy session but pt was not agreeable to standing activities secondary to right hip pain.  Pt stated that MD told her she had bursitis.  Pt c/o left shoulder pain and increased tone noted in upper trapezius.  Recommended kinesio taping but pt concerned that her skin is too sensitive.  Pt agreeable to LUE NMR.  Pt transitioned to therapy gym and performed SPT to therapy mat and engaged in LUE NMR/activities.  Pt requires max multimodal cues to isolate muscles for shoulder flexion/extension and elbow flexion/extension. Pt c/o increased shoulder pain after activities/tasks.  Pt returned to room and remained in w/c with all needs within reach.  RN notified of pt's request for pain meds.   Leotis Shames [facility] 04/01/2016, 11:02 AM

## 2016-04-01 NOTE — Progress Notes (Signed)
Physical Therapy Discharge Summary  Patient Details  Name: Danielle Vargas MRN: [id number] Date of Birth: 1951-01-06  Today's Date: 04/01/2016 PT Individual Time: 1100-1150 PT Individual Time Calculation (min): 50 min     Patient has met 7 of 11 long term goals due to improved activity tolerance, improved balance, improved postural control, increased strength, ability to compensate for deficits, functional use of  left upper extremity and left lower extremity, improved attention, improved awareness and improved coordination. Patient to discharge at w/c level, with ambulation short distances in controlled environment level Min Assist.   Patient's family unable to perform physical/cognitive assistance at d/c; pt will continue to benefit from skilled PT services in SNF to promote neuromuscular return and functional independence.   Reasons goals not met: W/c propulsion goals unmet, N/A due to pt unable to reach LEs to floor in standard height w/c, however cannot transfer functionally from hemi-height chair. Controlled environment goal unmet d/t limited activity tolerance and R hip pain limiting gait distance to approximately 50'.   Recommendation:  Patient will benefit from ongoing skilled PT services in skilled nursing facility setting to continue to advance safe functional mobility, address ongoing impairments in strength, coordination, activity tolerance, balance, and minimize fall risk.  Equipment: RLE AFO  Reasons for discharge: treatment goals met and discharge from hospital  Patient/family agrees with progress made and goals achieved: Yes  PT Discharge Precautions/RestrictionsPrecautions Precautions: Fall Restrictions Weight Bearing Restrictions: No Pain Pain Assessment Pain Assessment: 0-10 Pain Score: 8  Pain Type: Acute pain Pain Location: Hip Pain Orientation: Right Pain Descriptors / Indicators: Aching Pain Frequency: Intermittent Pain Onset: Gradual Patients Stated  Pain Goal: 3 Pain Intervention(s): RN made aware Multiple Pain Sites: No Cognition Orientation Level: Oriented X4 Motor  Motor Motor - Discharge Observations: L hemiparesis   Mobility Bed Mobility Bed Mobility: Supine to Sit;Sit to Supine Supine to Sit: 4: Min assist Supine to Sit Details: Verbal cues for sequencing;Verbal cues for technique;Manual facilitation for placement;Verbal cues for precautions/safety Sit to Supine: 4: Min assist Sit to Supine - Details: Tactile cues for placement;Verbal cues for precautions/safety;Verbal cues for technique;Manual facilitation for placement;Manual facilitation for weight shifting Transfers Transfers: Yes Sit to Stand: 5: Supervision Sit to Stand Details: Verbal cues for precautions/safety Stand Pivot Transfers: 5: Supervision Stand Pivot Transfer Details: Verbal cues for precautions/safety Locomotion  Ambulation Ambulation: Yes Ambulation/Gait Assistance: 5: Supervision Ambulation Distance (Feet): 55 Feet Assistive device: Hemi-walker Ambulation/Gait Assistance Details: Verbal cues for technique;Verbal cues for gait pattern;Verbal cues for precautions/safety Gait Gait: Yes Gait Pattern: Impaired Gait Pattern: Lateral hip instability;Decreased dorsiflexion - left;Decreased weight shift to right;Poor foot clearance - left Gait velocity: decreased for age/gender norms Stairs / Additional Locomotion Stairs: Yes Stairs Assistance: 4: Min guard Stair Management Technique: One rail Right;Forwards;Step to pattern Number of Stairs: 8 Height of Stairs: 3 Wheelchair Mobility Wheelchair Mobility: No (Pt does not propel w/c d/t high seat height, unable to functionally use hemi-height chair for transfers)  Trunk/Postural Assessment  Cervical Assessment Cervical Assessment: Within Functional Limits Thoracic Assessment Thoracic Assessment: Exceptions to [facility] (increased kyphosis) Lumbar Assessment Lumbar Assessment: Exceptions to [facility] (decreased  lumbar lordosis, posterior pelvic tilt) Postural Control Postural Control: Deficits on evaluation (delayed stepping/righting reactions)  Balance Balance Balance Assessed: Yes Static Sitting Balance Static Sitting - Balance Support: No upper extremity supported;Feet supported Static Sitting - Level of Assistance: 6: Modified independent (Device/Increase time) Dynamic Sitting Balance Dynamic Sitting - Balance Support: No upper extremity supported;Feet supported;During functional activity Dynamic Sitting - Level  of Assistance: 6: Modified independent (Device/Increase time) Dynamic Sitting - Balance Activities: Reaching for objects;Reaching across midline Static Standing Balance Static Standing - Balance Support: Right upper extremity supported Static Standing - Level of Assistance: 5: Stand by assistance Dynamic Standing Balance Dynamic Standing - Balance Support: Right upper extremity supported Dynamic Standing - Level of Assistance: 5: Stand by assistance Dynamic Standing - Balance Activities: Reaching across midline;Reaching for objects Extremity Assessment  RUE Assessment RUE Assessment: Within Functional Limits LUE Assessment LUE Assessment: Exceptions to [facility] (2+/5 shoulder, 2-/5 elbow, 0/5 wrist and hand) RLE Assessment RLE Assessment: Within Functional Limits LLE Assessment LLE Assessment: Exceptions to [facility] LLE Strength Left Hip Flexion: 4-/5 Left Hip Extension: 3+/5 Left Hip ABduction: 3/5 Left Hip ADduction: 3+/5 Left Knee Flexion: 4-/5 Left Knee Extension: 4-/5 Left Ankle Dorsiflexion: 2+/5 Left Ankle Plantar Flexion: 3/5   Skilled Therapeutic Intervention: Pt received seated in w/c; denies pain initially however increased pain in R hip with mobility activities during session, with pt ultimately refusing to perform stairs during session and ending session early. Assessed all mobility as described below with close S overall, utilizing HW for stand pivot and ambulation. Pt  educated in advocating for herself at next facility to continue progressing toward long term goals or returning home and ambulating independently. Pt remained seated in w/c at end of session, all needs in reach.    See Function Navigator for Current Functional Status.  Benjiman Core Tygielski 04/01/2016, 12:23 PM

## 2016-04-02 LAB — GLUCOSE, CAPILLARY: GLUCOSE-CAPILLARY: 102 mg/dL — AB (ref 65–99)

## 2016-04-02 MED ORDER — HYDROCODONE-ACETAMINOPHEN 5-325 MG PO TABS: 1.0000 | ORAL_TABLET | Freq: Three times a day (TID) | ORAL | 0 refills | Status: DC | PRN

## 2016-04-02 NOTE — Progress Notes (Signed)
Report given to [facility] at [facility]. Patient aware of transfer to [facility] today, states she is ready to move on to next step of care. Neuro assessment unchanged.

## 2016-04-02 NOTE — Progress Notes (Signed)
[facility] PHYSICAL MEDICINE & REHABILITATION     PROGRESS NOTE  Subjective/Complaints:   Patient without complaints today. She is trying to move her left arm more. She is quite pleased with her progress overall.  ROS: Denies CP, SOB, N/V/D.  Objective: Vital Signs: Blood pressure (!) 162/67, pulse 77, temperature 97.9 F (36.6 C), temperature source Oral, resp. rate 19, height 5\' 1"  (1.549 m), weight 99.4 kg (219 lb 2.2 oz), SpO2 98 %. No results found. No results for input(s): WBC, HGB, HCT, PLT in the last 72 hours. No results for input(s): NA, K, CL, GLUCOSE, BUN, CREATININE, CALCIUM in the last 72 hours.  Invalid input(s): CO CBG (last 3)   Recent Labs  04/01/16 1607 04/01/16 2127 04/02/16 0725  GLUCAP 135* 164* 102*    Wt Readings from Last 3 Encounters:  03/24/16 99.4 kg (219 lb 2.2 oz)  03/13/16 96.9 kg (213 lb 10 oz)  03/04/16 100.2 kg (221 lb)    Physical Exam:  BP (!) 162/67 (BP Location: Right Arm)   Pulse 77   Temp 97.9 F (36.6 C) (Oral)   Resp 19   Ht 5\' 1"  (1.549 m)   Wt 99.4 kg (219 lb 2.2 oz)   SpO2 98%   BMI 41.41 kg/m  Constitutional: She appears well-developed and well-nourished.  HENT: Normocephalic and atraumatic.  Cardiovascular: Normal rate and regular rhythm.  Respiratory: Effort normal and breath sounds normal. No respiratory distress.  GI: Soft. Bowel sounds are normal. She exhibits no distension.  Musculoskeletal: She exhibits no edema or tenderness. No pain with range of motion of the foot or ankle on the left side.  Neurological: She is alert and oriented. No evidence of clonus at the left ankle  Follow commands.  Left facial weakness. Motor: RUE: 5/5 proximal to distal RLE: Hip flexion 4+/5, knee extension 4/5, ankle dorsi/plantar flexion 5/5 LUE: 2 minus /5 left grip, 2 minus, shoulder protraction and retraction, 2 minus, triceps, 0 biceps, Patient is now able to pronate LLE: 3 minus. Knee extension, trace ankle  dorsiflexion Skin: Skin is warm and dry.  Psychiatric:  Mood, affect,brighter extremities show no evidence of edema and no pain with range of motion of the toes of either foot. No erythema Assessment/Plan: 1. 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  Plan discharge to skilled nursing facility today Function:  Bathing Bathing position   Position: Shower  Bathing parts Body parts bathed by patient: Left arm, Chest, Abdomen, Right upper leg, Left upper leg, Front perineal area Body parts bathed by helper: Right arm, Right lower leg, Left lower leg  Bathing assist Assist Level: Touching or steadying assistance(Pt > 75%) (Mod A)      Upper Body Dressing/Undressing Upper body dressing   What is the patient wearing?: Pull over shirt/dress     Pull over shirt/dress - Perfomed by patient: Thread/unthread left sleeve, Put head through opening, Thread/unthread right sleeve, Pull shirt over trunk Pull over shirt/dress - Perfomed by helper: Thread/unthread left sleeve        Upper body assist Assist Level: Touching or steadying assistance(Pt > 75%)      Lower Body Dressing/Undressing Lower body dressing   What is the patient wearing?: Pants, Socks     Pants- Performed by patient: Thread/unthread left pants leg Pants- Performed by helper: Thread/unthread right pants leg, Thread/unthread left pants leg, Pull pants up/down   Non-skid slipper socks- Performed by helper: Don/doff right sock, Don/doff left  sock   Socks - Performed by helper: Don/doff right sock, Don/doff left sock   Shoes - Performed by helper: Don/doff right shoe, Don/doff left shoe, Fasten right, Fasten left   AFO - Performed by helper: Don/doff left AFO      Lower body assist Assist for lower body dressing:  (Max A)      Toileting Toileting   Toileting steps completed by patient: Performs perineal hygiene Toileting steps completed by helper: Adjust  clothing prior to toileting, Performs perineal hygiene, Adjust clothing after toileting Toileting Assistive Devices: Grab bar or rail  Toileting assist Assist level: Touching or steadying assistance (Pt.75%)   Transfers Chair/bed transfer   Chair/bed transfer method: Stand pivot Chair/bed transfer assist level: Supervision or verbal cues Chair/bed transfer assistive device: Walker, Orthosis Mechanical lift: Ecologist Ambulation activity did not occur: Refused   Max distance: 55 Assist level: Supervision or verbal cues   Wheelchair Wheelchair activity did not occur: Refused (Unable to propel d/t height of chair) Type: Manual Max wheelchair distance: 76ft  Assist Level: Maximal assistance (Pt 25 - 49%)  Cognition Comprehension Comprehension assist level: Follows complex conversation/direction with no assist  Expression Expression assist level: Expresses complex ideas: With extra time/assistive device  Social Interaction Social Interaction assist level: Interacts appropriately 90% of the time - Needs monitoring or encouragement for participation or interaction.  Problem Solving Problem solving assist level: Solves complex problems: With extra time  Memory Memory assist level: Assistive device: No helper    Medical Problem List and Plan: 1. 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 Status post loop recorder Medically stable for discharge to skilled nursing facility   2. DVT Prophylaxis/Anticoagulation: SCDs. Monitor for any signs of DVT. Venous Doppler study negative 3. Pain Management: Neurontin 300 mg 3 times a day, Topamax 50 mg twice a day, hydrocodone as needed,right hip bursitis, avoid sidelying , trial sportscream Voltaren gel, We'll start when necessary, Zanaflex for spasms. 4. Diabetes mellitus of peripheral neuropathy. Hemoglobin A1c 7.3.   Lantus insulin increased to47 units twice a  day on 7/13-, 7/17 increase to 50U BID, improved a.m. CBG  NovoLog 15 units 3 times a day, increased to 20U on 7/9.  Check blood sugars before meals and at bedtime. Diabetic teaching, No change since diabetic management.    CBG (last 3) Improving  Recent Labs  04/01/16 1607 04/01/16 2127 04/02/16 0725  GLUCAP 135* 164* 102*    5. Neuropsych: This patient is capable of making decisions on her own behalf. 6. Skin/Wound Care: Routine skin checks 7. Fluids/Electrolytes/Nutrition: Routine I&O 8. Hypertension. Coreg 6.25 mg twice a day, heart rate is in a good range.May need further fine-tuning of systolic blood pressure while in skilled nursing facility  Vitals:   04/01/16 1732 04/02/16 0520  BP: (!) 160/66 (!) 162/67  Pulse: 76 77  Resp:  19  Temp:  97.9 F (36.6 C)   9. Hypothyroidism. Synthroid 10. Hyperlipidemia. Crestor, fenofibrate 11. Asthma. Singulair 10 mg daily, Dulera 2 puffs twice daily, albuterol nebulizer as needed 12. Morbid Obesity. Body mass index is 40.39 kg/(m^2)., Diet and exercise education, cont to encourage weight loss to increase endurance and promote overall health. Dietary follow-up   13. Obesity hypoventilation syndrome: CPAP, Patient states she uses it regularly at home, but not regularly. In the hospital 15. Psychosocial, patient likely going to SNF post discharge LOS (Days) 19 A FACE TO FACE EVALUATION WAS PERFORMED  Charlett Blake 04/02/2016 9:26 AM

## 2016-04-02 NOTE — Progress Notes (Signed)
Social Work  Discharge Note  The overall goal for the admission was met for:   Discharge location: [facility]  Length of Stay: No-19 DAYS  Discharge activity level: Yes  Home/community participation: Yes  Services provided included: MD, RD, PT, OT, SLP, RN, CM, TR, Pharmacy and SW  Financial Services: Medicare and Private Insurance: [place]  Follow-up services arranged: Other: SHORT TERM NHP  Comments (or additional information):  Patient/Family verbalized understanding of follow-up arrangements: Yes  Individual responsible for coordination of the follow-up plan: SELF & KATIE-DAUGHTER  Confirmed correct DME delivered: [name] 04/02/2016    [name]

## 2016-04-04 ENCOUNTER — Telehealth: Payer: Self-pay | Admitting: *Deleted

## 2016-04-04 NOTE — Telephone Encounter (Signed)
Contact made with [facility], information given to staff about appointment time and address of our office  Transitional Care Questions     1. Are you/is patient experiencing any problems since coming home? Are there any questions regarding any aspect of care? No problems 2. Are there any questions regarding medications administration/dosing? Are meds being taken as prescribed? Medications are being administered by nursing home  3. Have there been any falls? No falls 4. Has Home Health been to the house and/or have they contacted you? If not, have you tried to contact them? Can we help you contact them? She is still in SNF 5. Are bowels and bladder emptying properly? Are there any unexpected incontinence issues? If applicable, is patient following bowel/bladder programs? No problems 6. Any fevers, problems with breathing, unexpected pain? No 7. Are there any skin problems or new areas of breakdown? No 8. Has the patient/family member arranged specialty MD follow up (ie cardiology/neurology/renal/surgical/etc)?  Can we help arrange? Appt given for Dr Posey Pronto 9. Does the patient need any other services or support that we can help arrange? No 10. Are caregivers following through as expected in assisting the patient?N/a 11. Has the patient quit smoking, drinking alcohol, or using drugs as recommended?N/a  Appointment with Dr Posey Pronto 04/11/16@ 9:40 arrive by 9:30 Address given

## 2016-04-11 ENCOUNTER — Encounter: Payer: Medicare Other | Attending: Physical Medicine & Rehabilitation | Admitting: Physical Medicine & Rehabilitation

## 2016-04-11 ENCOUNTER — Encounter: Payer: Self-pay | Admitting: Physical Medicine & Rehabilitation

## 2016-04-11 ENCOUNTER — Ambulatory Visit (INDEPENDENT_AMBULATORY_CARE_PROVIDER_SITE_OTHER): Payer: Medicare Other | Admitting: *Deleted

## 2016-04-11 VITALS — BP 115/69 | HR 64

## 2016-04-11 DIAGNOSIS — E669 Obesity, unspecified: Secondary | ICD-10-CM | POA: Insufficient documentation

## 2016-04-11 DIAGNOSIS — Z5189 Encounter for other specified aftercare: Secondary | ICD-10-CM | POA: Diagnosis present

## 2016-04-11 DIAGNOSIS — E785 Hyperlipidemia, unspecified: Secondary | ICD-10-CM | POA: Diagnosis not present

## 2016-04-11 DIAGNOSIS — G894 Chronic pain syndrome: Secondary | ICD-10-CM | POA: Diagnosis not present

## 2016-04-11 DIAGNOSIS — E119 Type 2 diabetes mellitus without complications: Secondary | ICD-10-CM | POA: Diagnosis not present

## 2016-04-11 DIAGNOSIS — I10 Essential (primary) hypertension: Secondary | ICD-10-CM

## 2016-04-11 DIAGNOSIS — I69354 Hemiplegia and hemiparesis following cerebral infarction affecting left non-dominant side: Secondary | ICD-10-CM

## 2016-04-11 DIAGNOSIS — I639 Cerebral infarction, unspecified: Secondary | ICD-10-CM | POA: Diagnosis not present

## 2016-04-11 DIAGNOSIS — G8929 Other chronic pain: Secondary | ICD-10-CM | POA: Insufficient documentation

## 2016-04-11 DIAGNOSIS — E0849 Diabetes mellitus due to underlying condition with other diabetic neurological complication: Secondary | ICD-10-CM

## 2016-04-11 NOTE — Progress Notes (Signed)
[facility] Summary Report / Loop Recorder 

## 2016-04-11 NOTE — Progress Notes (Signed)
Subjective:    Patient ID: Danielle Vargas, female    DOB: 31-Jul-1951, 65 y.o.   MRN: [id number]  HPI  65 y.o. right handed female with history of hypertension, obesity,Diabetes mellitus, hyperlipidemia and left rotator cuff repair presents for hospital follow up after right MCA.  Admit date: 03/14/2016 Discharge date: 04/02/2016 At time of discharge, she was discharged to SNF.  She was encouraged to follow up with Neurology, she has an appointment 05/01/16.  She had her BMET checked, but results are not available.  She has been having her CBGs checked.  She is upset because she states she low AM values.  She is also upset because Lantus was changed without her knowledge.  She is also upset about the food at SNF.  Pain is "the usual". She is getting therapies and pleased with her progress.  Pain Inventory Average Pain 7 Pain Right Now 7 My pain is constant and aching  In the last 24 hours, has pain interfered with the following? General activity 8 Relation with others 5 Enjoyment of life 7 What TIME of day is your pain at its worst? evening Sleep (in general) Fair  Pain is worse with: walking and bending Pain improves with: rest and medication Relief from Meds: 8  Mobility walk with assistance use a cane use a walker use a wheelchair transfers alone  Function disabled: date disabled . I need assistance with the following:  dressing, bathing, meal prep, household duties and shopping  Neuro/Psych bladder control problems weakness trouble walking spasms anxiety  Prior Studies CT/MRI hospital f/u  Physicians involved in your care Primary care william shaw Neurologist Dr. Erlinda Hong hospital f/u   Family History  Problem Relation Age of Onset  . COPD Father   . Stroke Father   . Emphysema Father   . Heart failure Mother   . Heart attack Mother     several  . Diabetes Mother   . Kidney disease Mother   . Heart failure Brother   . Hypertension Brother   . Colon cancer   45    mat. 1st cousin  . Other      celiac sprue, 1/2 brother  . Diabetes Brother     x 3  . Lung cancer Brother   . Cirrhosis Sister     liver transplant  . Liver disease Sister     transplant  . Heart attack Paternal Grandfather   . Heart attack Brother   . Colon cancer Paternal Aunt 94  . Esophageal cancer Neg Hx   . Stomach cancer Neg Hx   . Rectal cancer Neg Hx    Social History   Social History  . Marital status: Divorced    Spouse name: N/A  . Number of children: 2  . Years of education: N/A   Occupational History  . disabled Unemployed   Social History Main Topics  . Smoking status: Never Smoker  . Smokeless tobacco: Never Used  . Alcohol use No  . Drug use: No  . Sexual activity: Not Currently    Birth control/ protection: Post-menopausal   Other Topics Concern  . None   Social History Narrative   Lives in [place] alone.  Retired.  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   Regular exercise: none   Caffeine use: daily; dt coke            Past Surgical History:  Procedure Laterality Date  . BREAST REDUCTION SURGERY  '88  .  CARPAL TUNNEL RELEASE  2000   right wrist  . CERVICAL DISC SURGERY  '98   fusion  . CESAREAN SECTION  '79  '84  . COLONOSCOPY    . EP IMPLANTABLE DEVICE N/A 03/12/2016   Procedure: Loop Recorder Insertion;  Surgeon: Will Meredith Leeds, MD;  Location: [facility];  Service: Cardiovascular;  Laterality: N/A;  . LEFT HEART CATHETERIZATION WITH CORONARY ANGIOGRAM N/A 10/26/2013   Procedure: LEFT HEART CATHETERIZATION WITH CORONARY ANGIOGRAM;  Surgeon: Sinclair Grooms, MD;  Location: [facility] CATH LAB;  Service: Cardiovascular;  Laterality: N/A;  . ROTATOR CUFF REPAIR  '09   left shoulder  . TEE WITHOUT CARDIOVERSION N/A 03/12/2016   Procedure: TRANSESOPHAGEAL ECHOCARDIOGRAM (TEE);  Surgeon: Sanda Klein, MD;  Location: [facility] ENDOSCOPY;  Service: Cardiovascular;  Laterality: N/A;  . TUBAL LIGATION  '85  . ULNAR NERVE  REPAIR  '02, '08   left done, then right   Past Medical History:  Diagnosis Date  . Allergy   . Asthma   . CAD (coronary artery disease)    a. 12/2008 Cath: mild irregs throughout, EF 75%.  . Cataract    "just beginning"  . Colon polyps 7/07   hyperplastic and adenomatous  . Depression    pt unsure, psychologist said no  . Diverticulosis   . DM (diabetes mellitus) ([place])   . Dyslipidemia   . External hemorrhoid   . Fatty liver   . Gastroparesis   . HTN (hypertension)   . Hyperlipidemia   . 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 Hypothyroidism   . Migraine   . Myocardial infarction ([place])    FEb 2015  . Nephrolithiasis   . Obesity   . Osteoporosis   . Sleep apnea    wears CPAP   BP 115/69 (BP Location: Right Arm, Patient Position: Sitting, Cuff Size: Large)   Pulse 64   SpO2 92%   Opioid Risk Score:   Fall Risk Score:  `1  Depression screen PHQ 2/9  Depression screen [facility] 2/9 09/21/2015 09/18/2014 12/26/2013  Decreased Interest 0 1 0  Down, Depressed, Hopeless 0 1 1  PHQ - 2 Score 0 2 1    Review of Systems  Constitutional: Positive for fatigue and unexpected weight change.  Eyes: Negative.   Respiratory: Positive for apnea and wheezing.   Cardiovascular: Positive for leg swelling.  Endocrine:       Diabetic, high blood sugar  Genitourinary:       Incontinence  Musculoskeletal: Positive for arthralgias, back pain and gait problem.  Skin: Negative.   Allergic/Immunologic: Negative.   Neurological: Positive for weakness.  Hematological: Bruises/bleeds easily.  Psychiatric/Behavioral: The patient is nervous/anxious.   All other systems reviewed and are negative.     Objective:   Physical Exam Constitutional: She appears well-developed and well-nourished.  HENT: Normocephalic and atraumatic.  Cardiovascular: Normal rate and regular rhythm.  Respiratory: Effort normal and breath sounds normal. No respiratory distress.  GI: Soft. Bowel sounds are  normal. She exhibits no distension.  Musculoskeletal: She exhibits no edema or tenderness.   Neurological: She is alert and oriented.  Left facial weakness. Motor: RUE: 5/5 proximal to distal RLE: Hip flexion 4+/5, knee extension 5/5, ankle dorsi/plantar flexion 5/5 LUE: 4-/5 left grip, 3-/5 shoulder protraction and retraction, 3-/5 triceps, 3/5 biceps,  LLE: 4-/5 hip flexion, 4+/5 knee extension, 3/5 ankle dorsi/plantar flexion No increase in tone noted. Skin: Skin is warm and dry.  Psychiatric:  Mood, affect appears normal.  Assessment & Plan:  65 y.o. right handed female with history of hypertension, obesity,Diabetes mellitus, hyperlipidemia and left rotator cuff repair presents for hospital follow up after right MCA.   1. 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   Cont meds  Follow up with Neurology  Cont therapies    2. Chronic pain  Cont meds  3. Diabetes mellitus of peripheral neuropathy.   Follow up with Endocrinology for adjustments  4. Hypertension  Cont meds  Under good control at present  RTC 3 months

## 2016-04-23 ENCOUNTER — Other Ambulatory Visit: Payer: Self-pay

## 2016-04-23 ENCOUNTER — Telehealth: Payer: Self-pay | Admitting: Internal Medicine

## 2016-04-23 NOTE — Telephone Encounter (Signed)
Called patient to discuss switching from Humalin R to Humalog if she needs pens, will ask patient what she would rather do, because the times to inject them would be different. Patient did not answer, left message to call back so we could discuss, left call back number.

## 2016-04-23 NOTE — Telephone Encounter (Signed)
Because of the patient's recent stroke will need to start using the PEN to inject her insulin. Please call a 30 day supply to the [facility] on [address] because she needs them now. Then the rest of the prescription can be sent to [facility] order.

## 2016-04-23 NOTE — Telephone Encounter (Signed)
Humulin R does not come in a pen form >> we will need to switch to Humalog (not sure if this is covered - we can try a PA, but this may take time) if she absolutely needs to use the pens... She will need to use the following Humulin R doses - per my last note:             - 35 units (small meal) - 40 units (regular meal)  - 45 units (larger meal)  If she switches from Humulin to Humalog, she can maintain the same doses but inject them 15 minutes rather than 30 minutes before a meal.

## 2016-04-24 ENCOUNTER — Telehealth: Payer: Self-pay

## 2016-04-24 NOTE — Telephone Encounter (Signed)
Called and left message to return phone call to discuss pens, and insulin. Gave call back number.

## 2016-04-25 ENCOUNTER — Telehealth: Payer: Self-pay

## 2016-04-25 MED ORDER — INSULIN LISPRO 100 UNIT/ML (KWIKPEN): PEN_INJECTOR | SUBCUTANEOUS | 1 refills | Status: DC

## 2016-04-25 NOTE — Telephone Encounter (Signed)
Called and discussed with patient about the insulins, patient stated she need the pens, I advised of Dr.Gherghe's note. I sent in the pens and advised patient to take 15 minutes before meals. Also advised patient if we needed to send in a PA that would take time, and we would call patient if that is what needed to happen.

## 2016-04-28 ENCOUNTER — Ambulatory Visit: Payer: Medicare Other | Admitting: Sports Medicine

## 2016-05-01 ENCOUNTER — Ambulatory Visit (INDEPENDENT_AMBULATORY_CARE_PROVIDER_SITE_OTHER): Payer: Medicare Other | Admitting: Neurology

## 2016-05-01 ENCOUNTER — Encounter: Payer: Self-pay | Admitting: Neurology

## 2016-05-01 VITALS — BP 121/61 | HR 78 | Ht 61.0 in | Wt 206.0 lb

## 2016-05-01 DIAGNOSIS — Z9989 Dependence on other enabling machines and devices: Secondary | ICD-10-CM

## 2016-05-01 DIAGNOSIS — G4733 Obstructive sleep apnea (adult) (pediatric): Secondary | ICD-10-CM

## 2016-05-01 DIAGNOSIS — I639 Cerebral infarction, unspecified: Secondary | ICD-10-CM | POA: Diagnosis not present

## 2016-05-01 DIAGNOSIS — I63511 Cerebral infarction due to unspecified occlusion or stenosis of right middle cerebral artery: Secondary | ICD-10-CM

## 2016-05-01 DIAGNOSIS — I10 Essential (primary) hypertension: Secondary | ICD-10-CM

## 2016-05-01 DIAGNOSIS — I5032 Chronic diastolic (congestive) heart failure: Secondary | ICD-10-CM | POA: Diagnosis not present

## 2016-05-01 DIAGNOSIS — E785 Hyperlipidemia, unspecified: Secondary | ICD-10-CM | POA: Diagnosis not present

## 2016-05-01 NOTE — Patient Instructions (Signed)
-   continue plavix and crestor for stroke prevention - check BP at facility and avoid low BP, BP goal 120-150 - check glucose at facility  - Follow up with your primary care physician for stroke risk factor modification. Recommend maintain blood pressure goal 120-150/80, diabetes with hemoglobin A1c goal below 7.0% and lipids with LDL cholesterol goal below 70 mg/dL.  - continue aggressive therapy to improve left side strength and also self exercise - continue loop recorder monitoring - will think about research trials (Aspirin vs. pradaxa) in the near future - follow up in 2 months.

## 2016-05-01 NOTE — Progress Notes (Signed)
STROKE NEUROLOGY FOLLOW UP NOTE  NAME: Aubrea Deluccia DOB: [birth year]  REASON FOR VISIT: stroke follow up HISTORY FROM: pt and chart  Today we had the pleasure of seeing Jalanda Spriggs in follow-up at our Neurology Clinic. Pt was accompanied by no one.   History Summary Ms. Narah Leverette is a 65 y.o. female with history of HTN, DM, HLD, CAD, MI, obesity, migraine , Topamax, OSA on CPAP admitted on 03/10/2016 for L sided weakness and left facial droop. MRI showed right MCA infarct with suspect right M1 stenosis/occlusion. There was also old infarct left insular and left cerebellum. CTA head and neck showed severe stenosis right M2 superior branch. TTE, TEE, and DVT negative. Loop recorder placed. LDL 63 and A1c 7.1. Patient aspirin changed to Plavix, and continue Crestor and fenofibrate. However, patient had worsening weakness to left hemiplegia associated with hypotension. Repeat MRI showed extension of right MCA territory infarcts in her BP much improved after discontinue Norvasc and cozaar, as well as decreased Coreg to 6.25 bid and IV fluid. She was discharged to CIR after stabilization.   Interval History During the interval time, the patient has been doing better. She stayed in CIR for about 3 weeks and then discharged to SNF. Her left-sided weakness much improved. Last Friday she was discharged home, however, at home her daughter not able to take care of her, therefore she was back to SNF this Monday. BP today 121/61. She is able to walk at SNF with hemi-walker. Continued on all the medication without side effect. Introduced  Haematologist trial and she is interested but will pursue once she is discharged to home.   REVIEW OF SYSTEMS: Full 14 system review of systems performed and notable only for those listed below and in HPI above, all others are negative:  Constitutional:   Cardiovascular:  Ear/Nose/Throat:   Skin:  Eyes:   Respiratory:   Gastroitestinal:   Incontinence Genitourinary: Incontinence Hematology/Lymphatic:   Endocrine:  Musculoskeletal:  Cramps Allergy/Immunology:   Neurological:   Psychiatric:  Sleep:   The following represents the patient's updated allergies and side effects list: Allergies  Allergen Reactions  . Invokana [Canagliflozin] Diarrhea  . Nsaids Other (See Comments)    GI problems/ stomach pain  . Penicillins Rash    Has patient had a PCN reaction causing immediate rash, facial/tongue/throat swelling, SOB or lightheadedness with hypotension: No (rash on legs after 3 days [place] -June 2016) Has patient had a PCN reaction causing severe rash involving mucus membranes or skin necrosis: No Has patient had a PCN reaction that required hospitalization No Has patient had a PCN reaction occurring within the last 10 years: Yes If all of the above answers are "NO", then may proceed with Cephalosporin use.    The neurologically relevant items on the patient's problem list were reviewed on today's visit.  Neurologic Examination  A problem focused neurological exam (12 or more points of the single system neurologic examination, vital signs counts as 1 point, cranial nerves count for 8 points) was performed.  Blood pressure 121/61, pulse 78, height 5\' 1"  (1.549 m), weight 206 lb (93.4 kg).  General - Well nourished, well developed, in no apparent distress.  Ophthalmologic - Fundi not visualized due to noncooperation.  Cardiovascular - Regular rate and rhythm with no murmur.  Mental Status -  Level of arousal and orientation to time, place, and person were intact. Language including expression, naming, repetition, comprehension was assessed and found intact. Fund of Knowledge was assessed and  was intact.  Cranial Nerves II - XII - II - Visual field intact OU. III, IV, VI - Extraocular movements intact. V - Facial sensation intact bilaterally. VII - left facial droop. VIII - Hearing & vestibular intact  bilaterally. X - Palate elevates symmetrically. XI - Chin turning & shoulder shrug intact bilaterally. XII - Tongue protrusion intact.  Motor Strength - The patient's strength was 3/5 left UE and 4/5 left LE, 5/5 RUE and RLE.  Bulk was normal and fasciculations were absent.   Motor Tone - Muscle tone was assessed at the neck and appendages and was normal.  Reflexes - The patient's reflexes were 1+ in all extremities and she had no pathological reflexes.  Sensory - Light touch, temperature/pinprick were assessed and were normal.    Coordination - The patient had normal movements in the right hand with no ataxia or dysmetria.  Tremor was absent.  Gait and Station - in wheelchair, patient stated that she can walk with hemiwalker at facility.   Functional score  mRS = 3   0 - No symptoms.   1 - No significant disability. Able to carry out all usual activities, despite some symptoms.   2 - Slight disability. Able to look after own affairs without assistance, but unable to carry out all previous activities.   3 - Moderate disability. Requires some help, but able to walk unassisted.   4 - Moderately severe disability. Unable to attend to own bodily needs without assistance, and unable to walk unassisted.   5 - Severe disability. Requires constant nursing care and attention, bedridden, incontinent.   6 - Dead.   NIH Stroke Scale   Level Of Consciousness 0=Alert; keenly responsive 1=Not alert, but arousable by minor stimulation 2=Not alert, requires repeated stimulation 3=Responds only with reflex movements 0  LOC Questions to Month and Age 21=Answers both questions correctly 1=Answers one question correctly 2=Answers neither question correctly 0  LOC Commands      -Open/Close eyes     -Open/close grip 0=Performs both tasks correctly 1=Performs one task correctly 2=Performs neighter task correctly 0  Best Gaze 0=Normal 1=Partial gaze palsy 2=Forced deviation, or total gaze  paresis 0  Visual 0=No visual loss 1=Partial hemianopia 2=Complete hemianopia 3=Bilateral hemianopia (blind including cortical blindness) 0  Facial Palsy 0=Normal symmetrical movement 1=Minor paralysis (asymmetry) 2=Partial paralysis (lower face) 3=Complete paralysis (upper and lower face) 2  Motor  0=No drift, limb holds posture for full 10 seconds 1=Drift, limb holds posture, no drift to bed 2=Some antigravity effort, cannot maintain posture, drifts to bed 3=No effort against gravity, limb falls 4=No movement Right Arm 0     Leg 0    Left Arm 1     Leg 1  Limb Ataxia 0=Absent 1=Present in one limb 2=Present in two limbs 0  Sensory 0=Normal 1=Mild to moderate sensory loss 2=Severe to total sensory loss 0  Best Language 0=No aphasia, normal 1=Mild to moderate aphasia 2=Mute, global aphasia 3=Mute, global aphasia 0  Dysarthria 0=Normal 1=Mild to moderate 2=Severe, unintelligible or mute/anarthric 0  Extinction/Neglect 0=No abnormality 1=Extinction to bilateral simultaneous stimulation 2=Profound neglect 0  Total   4     Data reviewed: I personally reviewed the images and agree with the radiology interpretations.  Dg Chest 2 View 03/10/2016   No active cardiopulmonary disease. Stable cardiomegaly.   Ct Head Wo Contrast 03/10/2016   Acute right MCA infarct involving the insular cortex and adjacent right frontal and temporal lobes. No hemorrhage.   Ct  Cervical Spine Wo Contrast 03/10/2016  No acute bony abnormality in the cervical spine.   Mr Brain Wo Contrast 03/10/2016   Extensive areas of both focal and confluent restricted diffusion throughout the RIGHT MCA distribution representing acute nonhemorrhagic infarction. Suspected RIGHT M1 MCA long segment stenosis or occlusion. Atrophy with small vessel disease. Areas of chronic infarction, most notable LEFT insula and LEFT cerebellum.   CTA head and neck 03/11/2016 Non stenotic extracranial carotid bifurcation disease,  consisting of calcific plaque. Severe stenosis of the RIGHT MCA M2 superior division vessel, corresponding with the observed areas of infarction. Developing nonhemorrhagic cytotoxic edema, RIGHT hemisphere, without significant mass effect.  TTE - - Very limited study. Only a few parasternal window images were  obtained, after which the patient was unable to tolerate the  study.  The left ventricle appears normal in overall size and function,  but wall motion cannot be analyzed.  The left atrium is athe upper limit of normal in size.  The mitral valve appears grossly normal.  The other valves cannot be evaluated. The right heart chambers  are not seen. Probably no pericardial effusion.  TEE - No cardiac source of embolism. LVH and LA dilation present. Mild aortic arch plaque.  LE venous doppler - No evidence of DVT, superficial thrombosis, or Baker's Cyst.  Repeat limited MRI - 1. Mild extension of the acute right MCA infarct since 03/10/2016. Increased cytotoxic edema, but no associated hemorrhage or significant intracranial mass effect at this time. 2. No new intracranial abnormality.  Component     Latest Ref Rng & Units 03/11/2016  Cholesterol     0 - 200 mg/dL 138  Triglycerides     <150 mg/dL 232 (H)  HDL Cholesterol     >40 mg/dL 29 (L)  Total CHOL/HDL Ratio     RATIO 4.8  VLDL     0 - 40 mg/dL 46 (H)  LDL (calc)     0 - 99 mg/dL 63  Hemoglobin A1C     4.8 - 5.6 % 7.3 (H)  Mean Plasma Glucose     mg/dL 163  TSH     0.350 - 4.500 uIU/mL 10.113 (H)    Assessment: As you may recall, she is a 65 y.o. Caucasian female with PMH of HTN, DM, HLD, CAD, MI, obesity, migraine , Topamax, OSA on CPAP admitted on 03/10/2016 for right MCA infarct with suspect right M1 stenosis/occlusion. CTA head and neck showed severe stenosis right M2 superior branch. TTE, TEE, and DVT negative. Loop recorder placed. LDL 63 and A1c 7.1. Patient aspirin changed to Plavix, and continue  Crestor and fenofibrate. However, patient had worsening weakness to left hemiplegia associated with hypotension. Repeat MRI showed extension of right MCA territory infarcts in her BP much improved after discontinue Norvasc and cozaar, as well as decreased Coreg to 6.25 bid and IV fluid. She was discharged to [facility] and then SNF. Her left-sided weakness much improved. She is able to walk at SNF with hemi-walker. Introduced  Haematologist trial and she is interested but will pursue once she is discharged to home.   Plan:  - continue plavix and crestor for stroke prevention - check BP at facility and avoid low BP, BP goal 120-150 - check glucose at facility  - Follow up with your primary care physician for stroke risk factor modification. Recommend maintain blood pressure goal 120-150/80, diabetes with hemoglobin A1c goal below 7.0% and lipids with LDL cholesterol goal below 70 mg/dL.  - continue  aggressive therapy to improve left side strength and also self exercise - continue loop recorder monitoring - will think about RESPECT ESUS trial after pt discharged from SNF - follow up in 2 months.  I spent more than 25 minutes of face to face time with the patient. Greater than 50% of time was spent in counseling and coordination of care. We discussed BP goal, aggressive PT/OT and RESPECT ESUS trial.   No orders of the defined types were placed in this encounter.   No orders of the defined types were placed in this encounter.   Patient Instructions  - continue plavix and crestor for stroke prevention - check BP at facility and avoid low BP, BP goal 120-150 - check glucose at facility  - Follow up with your primary care physician for stroke risk factor modification. Recommend maintain blood pressure goal 120-150/80, diabetes with hemoglobin A1c goal below 7.0% and lipids with LDL cholesterol goal below 70 mg/dL.  - continue aggressive therapy to improve left side strength and also self exercise -  continue loop recorder monitoring - will think about research trials (Aspirin vs. pradaxa) in the near future - follow up in 2 months.     Rosalin Hawking, MD PhD [facility] [address], [place], [place] [address] [phone]

## 2016-05-06 LAB — CUP PACEART REMOTE DEVICE CHECK: Date Time Interrogation Session: 20170804133541

## 2016-05-06 NOTE — Progress Notes (Signed)
Carelink summary report received. Battery status OK. Normal device function. No new symptom episodes, brady, or pause episodes. No new AF episodes. 2 tachy previously addressed. Monthly summary reports and ROV/PRN

## 2016-05-13 ENCOUNTER — Ambulatory Visit (INDEPENDENT_AMBULATORY_CARE_PROVIDER_SITE_OTHER): Payer: Medicare Other | Admitting: *Deleted

## 2016-05-13 DIAGNOSIS — I639 Cerebral infarction, unspecified: Secondary | ICD-10-CM | POA: Diagnosis not present

## 2016-05-13 NOTE — Progress Notes (Signed)
[facility] Summary Report / Loop Recorder 

## 2016-05-15 ENCOUNTER — Ambulatory Visit: Payer: Medicare Other | Admitting: Neurology

## 2016-05-29 ENCOUNTER — Ambulatory Visit: Payer: Medicare Other | Admitting: Neurology

## 2016-05-29 ENCOUNTER — Telehealth: Payer: Self-pay

## 2016-05-29 NOTE — Telephone Encounter (Signed)
Pt did not show for their appt with Dr. Dohmeier today.  

## 2016-06-02 LAB — CUP PACEART REMOTE DEVICE CHECK: Date Time Interrogation Session: 20170903133543

## 2016-06-05 ENCOUNTER — Encounter: Payer: Self-pay | Admitting: Internal Medicine

## 2016-06-05 ENCOUNTER — Ambulatory Visit (INDEPENDENT_AMBULATORY_CARE_PROVIDER_SITE_OTHER): Payer: Medicare Other | Admitting: Internal Medicine

## 2016-06-05 VITALS — BP 150/72 | HR 78 | Ht 62.0 in | Wt 208.0 lb

## 2016-06-05 DIAGNOSIS — E139 Other specified diabetes mellitus without complications: Secondary | ICD-10-CM | POA: Diagnosis not present

## 2016-06-05 DIAGNOSIS — I639 Cerebral infarction, unspecified: Secondary | ICD-10-CM | POA: Diagnosis not present

## 2016-06-05 LAB — POCT GLYCOSYLATED HEMOGLOBIN (HGB A1C): Hemoglobin A1C: 6.2

## 2016-06-05 MED ORDER — INSULIN GLARGINE 100 UNIT/ML ~~LOC~~ SOLN: 45.0000 [IU] | Freq: Two times a day (BID) | SUBCUTANEOUS | 11 refills | Status: DC

## 2016-06-05 MED ORDER — INSULIN ASPART 100 UNIT/ML ~~LOC~~ SOLN: 30.0000 [IU] | Freq: Three times a day (TID) | SUBCUTANEOUS | 11 refills | Status: DC

## 2016-06-05 NOTE — Progress Notes (Signed)
Patient ID: Danielle Vargas, female   DOB: 1951/04/11, 65 y.o.   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 Last visit 2.5 mo ago.  She has [name].  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 Now in wheelchair, but can walk slowly with cane. She is in [place].   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  Previous regimen: - Metformin 1000 mg bid  - U500 0.11-0.11-0.10 units before meals >> 0.09 mL tid  She has been on Glipizide in the past, too. She tried Invokana in the past >> severe diarrhea  She is now on: Lantus 40 >> 45 >> 50 units in am and 40 >> 45 >> 50 units at bedtime Humulin R U100 >> Novolog - 20 units before meals.   Did not start Glipizide 5 mg 2x a day before b'fast and dinner.  Pt checks her sugars 3x a day: - am:116, 149-201, 247 >> 133-233, 318 >> 154-246, 257 >> 165-210 >> 161-240, 351 >> 99, 128-172, 178, 198 - 2h after b'fast: 136, 194-301 >> 134-246 >> 152-266 >> n/c >> 114-207 >> 115-205, 239 >> n/c - before lunch: 133-226 >> 149-302 >> 119, 167-318 >> 108-256 >> 141, 174-206, 229, 326 >> 148-221 - 2h after lunch: 93-223, 251 >> 188-222 >> 90-308 >> n/c >> 83, 126-199 >> 57x1, 99-247 >> n/c - before dinner: 201-252 >> 71-246 >> 152-266, 315 >> 134-231, 252 >> 101-238 >> 77, 110-246, 272 - 2h after dinner: 108-202, 266 >> 128-243 >> 184-235 >> n/c >> 111-214 >> 109-273, 339 >> n/c - bedtime:147-223 >> 133-300 >>  191-260, 289 >> 140-216, 252 >> 126-378 >> n/c - nighttime: 161-310 >> n/c >> 75-232 >> 136-291 >> n/c few lows - lowest 71 >> 119 >> 83 >> 58 >> 77; she has hypoglycemia awareness at 95.   Highest sugar was 300s >> 262 >> 300s >> 286.  Pt's meals are: - Breakfast: 2 slices of bread + PB - Lunch: sandwich;  salad + cheese + croutons;  baked potato >> pinto beans + cornbread - Dinner: leftovers or corn bread + pinto beans + occas. Cole slaw >> salad + lite dressing + saltines Not soft drinks anymore. Snacks at night  - has mild CKD, last BUN/creatinine:  Lab Results  Component Value Date   BUN 19 03/17/2016   CREATININE 0.94 03/17/2016  She is back on Losartan. - pt has HL:  02/2015: TG 252 (PCP)  Lab Results  Component Value Date   CHOL 138 03/11/2016   HDL 29 (L) 03/11/2016   LDLCALC 63 03/11/2016   TRIG 232 (H) 03/11/2016   CHOLHDL 4.8 03/11/2016  She is on Fenofibrate. - last eye exam was in 02/20/2016 - Dr Zigmund Daniel. R>L DR. No changes.   - + numbness and tingling in her feet. On neurontin.  She also has a history of HTN, HL, fatty liver, GERD, morbid obesity, hypothyroidism, migraines, chronic back pain, history of nephrolithiasis, osteoporosis, diverticulosis. She was recently dx'ed with OSA >>  on CPAP.  Since had pancreatitis (03/21/2015).   She has been admitted for CP 11/13/2015 >> r/o for AMI. She saw Dr Sallyanne Kuster in the hospital, her cardiologist is Dr. Tamala Julian.  I reviewed pt's medications, allergies, PMH, social hx, family hx, and changes were documented in the history of present illness. Otherwise, unchanged from my initial visit note.  ROS: Constitutional: no weight gain/loss, + fatigue, no subjective hyperthermia/hypothermia Eyes: no blurry vision, no xerophthalmia ENT: no sore throat, no nodules palpated in throat, no dysphagia/odynophagia, no hoarseness Cardiovascular: no CP/SOB/palpitations/leg swelling Respiratory: no cough/SOB/wheezing Gastrointestinal: no  N/V/D/C Musculoskeletal: no muscle/joint aches Skin: no rashes Neurological: no tremors/+ numbness/no tingling/dizziness - paresis in L arm but can move L fingers  PE: BP (!) 150/72   Pulse 78   Ht 5\' 2"  (1.575 m)   Wt 208 lb (94.3 kg)   BMI 38.04 kg/m  Body mass index is 38.04 kg/m. Wt Readings from Last 3 Encounters:  06/05/16 208 lb (94.3 kg)  05/01/16 206 lb (93.4 kg)  03/24/16 219 lb 2.2 oz (99.4 kg)   Constitutional: overweight, in NAD Eyes: PERRLA, EOMI, no exophthalmos ENT: moist mucous membranes, no thyromegaly, no cervical lymphadenopathy Cardiovascular: RRR, No MRG Respiratory: CTA B Gastrointestinal: abdomen soft, NT, ND, BS+ Musculoskeletal: no deformities, strength intact in all 4 Skin: moist, warm, no rashes  ASSESSMENT: 1. DM2, insulin-dependent, uncontrolled, with complications - CAD - AMI 10/26/2013 - had angioplasty, no stent placed. Had 2DEcho on 01/23/2014 EF increased (from 35) to 55%. - peripheral neuropathy, on Neurontin - Gastroparesis, on Reglan - Dr. 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  PLAN:  1. Patient with long-standing uncontrolled diabetes. At last visit, sugars were still above goal at most times of the day, especially before meals and after dinner >> we decreased insulin with meals, advised her to reduce her insulin if she stays active after a meal, and, as preprandial sugars were high >> we increased Lantus. However, since last visit, she had a stroke >> now in SNF >> insulin regimen was changed: increased Lantus and decreased Novolog >> will make the opposite change as her sugars are higher later in the day.  -  I suggested:  Patient Instructions  Please decrease Lantus to 45 units 2x a day.  Please increase Novolog to 30 units before each meal.   If sugars before a meal <90, then only give Novolog 15 units before that meal. If sugars before a meal <70, then skip Novolog before that  meal.  Check sugars at bedtime on Sunday and Wednesday.  Please return in 1.5 months with your sugar log.   - continue checking sugars 3x a day and add 2x a week bedtime checks sugars 4x a day as she is a complex insulin regimen  - check HbA1c today >> 6.2% (lower). - Return to clinic in 1.5 mo with sugar log   Philemon Kingdom, MD PhD [facility]

## 2016-06-05 NOTE — Patient Instructions (Addendum)
Please decrease Lantus to 45 units 2x a day.  Please increase Novolog to 30 units before each meal.   If sugars before a meal <90, then only give Novolog 15 units before that meal. If sugars before a meal <70, then skip Novolog before that meal.  Check sugars at bedtime on Sunday and Wednesday.  Please return in 1.5 months with your sugar log.

## 2016-06-05 NOTE — Addendum Note (Signed)
Addended by: Nile Riggs on: 06/05/2016 02:31 PM   Modules accepted: Orders

## 2016-06-10 ENCOUNTER — Ambulatory Visit (INDEPENDENT_AMBULATORY_CARE_PROVIDER_SITE_OTHER): Payer: Medicare Other | Admitting: *Deleted

## 2016-06-10 DIAGNOSIS — I639 Cerebral infarction, unspecified: Secondary | ICD-10-CM

## 2016-06-11 NOTE — Progress Notes (Signed)
[facility] Summary Report / Loop Recorder 

## 2016-07-10 ENCOUNTER — Ambulatory Visit (INDEPENDENT_AMBULATORY_CARE_PROVIDER_SITE_OTHER): Payer: Medicare Other | Admitting: Neurology

## 2016-07-10 ENCOUNTER — Encounter: Payer: Self-pay | Admitting: Neurology

## 2016-07-10 ENCOUNTER — Ambulatory Visit (INDEPENDENT_AMBULATORY_CARE_PROVIDER_SITE_OTHER): Payer: Medicare Other | Admitting: *Deleted

## 2016-07-10 VITALS — BP 92/59 | HR 68 | Ht 61.0 in | Wt 206.1 lb

## 2016-07-10 DIAGNOSIS — I10 Essential (primary) hypertension: Secondary | ICD-10-CM

## 2016-07-10 DIAGNOSIS — I63511 Cerebral infarction due to unspecified occlusion or stenosis of right middle cerebral artery: Secondary | ICD-10-CM

## 2016-07-10 DIAGNOSIS — I639 Cerebral infarction, unspecified: Secondary | ICD-10-CM

## 2016-07-10 DIAGNOSIS — E785 Hyperlipidemia, unspecified: Secondary | ICD-10-CM | POA: Diagnosis not present

## 2016-07-10 DIAGNOSIS — Z794 Long term (current) use of insulin: Secondary | ICD-10-CM

## 2016-07-10 DIAGNOSIS — Z9989 Dependence on other enabling machines and devices: Secondary | ICD-10-CM | POA: Diagnosis not present

## 2016-07-10 DIAGNOSIS — G4733 Obstructive sleep apnea (adult) (pediatric): Secondary | ICD-10-CM

## 2016-07-10 DIAGNOSIS — E1159 Type 2 diabetes mellitus with other circulatory complications: Secondary | ICD-10-CM

## 2016-07-10 NOTE — Progress Notes (Signed)
[facility] Summary Report / Loop Recorder 

## 2016-07-10 NOTE — Patient Instructions (Addendum)
-   continue plavix and crestor for stroke prevention - check BP at facility and avoid low BP, BP goal 120-150 - check glucose at facility  - elevate left UE and LE to improve swelling - follow up with Dr. Letta Pate regarding left UE weakness and spasticity and consider botox - decrease coreg from 6.25mg  twice a day to 3.125mg  twice a day - follow up with Dr. Brett Fairy for OSA and CPAP treatment - Follow up with your primary care physician for stroke risk factor modification. Recommend maintain blood pressure goal 120-150/80, diabetes with hemoglobin A1c goal below 7.0% and lipids with LDL cholesterol goal below 70 mg/dL.  - continue aggressive therapy to improve left side strength and also self exercise - continue loop recorder monitoring - follow up in 4 months.

## 2016-07-10 NOTE — Progress Notes (Signed)
STROKE NEUROLOGY FOLLOW UP NOTE  NAME: Shaniqwa Leitz DOB: 25-Mar-1951  REASON FOR VISIT: stroke follow up HISTORY FROM: pt and chart  Today we had the pleasure of seeing Cedrika Vaynshteyn in follow-up at our Neurology Clinic. Pt was accompanied by no one.   History Summary Ms. Rodessa Engberg is a 65 y.o. female with history of HTN, DM, HLD, CAD, MI, obesity, migraine , Topamax, OSA on CPAP admitted on 03/10/2016 for L sided weakness and left facial droop. MRI showed right MCA infarct with suspect right M1 stenosis/occlusion. There was also old infarct left insular and left cerebellum. CTA head and neck showed severe stenosis right M2 superior branch. TTE, TEE, and DVT negative. Loop recorder placed. LDL 63 and A1c 7.1. Patient aspirin changed to Plavix, and continue Crestor and fenofibrate. However, patient had worsening weakness to left hemiplegia associated with hypotension. Repeat MRI showed extension of right MCA territory infarcts in her BP much improved after discontinue Norvasc and cozaar, as well as decreased Coreg to 6.25 bid and IV fluid. She was discharged to CIR after stabilization.   05/01/16 follow up - the patient has been doing better. She stayed in CIR for about 3 weeks and then discharged to SNF. Her left-sided weakness much improved. Last Friday she was discharged home, however, at home her daughter not able to take care of her, therefore she was back to SNF this Monday. BP today 121/61. She is able to walk at SNF with hemi-walker. Continued on all the medication without side effect. Introduced  Haematologist trial and she is interested but will pursue once she is discharged to home.   Interval History During the interval time, pt remained in SNF and is doing fine. She still has left sided weakness but improved from last time. BP low today 92/59. She is on coreg 6.25mg  bid and lasix 10mg  daily. She still complains of left foreleg and left hand swollen. Still has PT/OT 3 times per  weak. She also doing self exercise. She said she can walk with 4-leg cane. Left hand spasticity and not able to extend fingers.    REVIEW OF SYSTEMS: Full 14 system review of systems performed and notable only for those listed below and in HPI above, all others are negative:  Constitutional:   Cardiovascular:  Ear/Nose/Throat:   Skin:  Eyes:   Respiratory:   Gastroitestinal:   Genitourinary:  Hematology/Lymphatic:   Endocrine:  Musculoskeletal:   Allergy/Immunology:   Neurological:  weakness Psychiatric:  Sleep:   The following represents the patient's updated allergies and side effects list: Allergies  Allergen Reactions  . Invokana [Canagliflozin] Diarrhea  . Nsaids Other (See Comments)    GI problems/ stomach pain  . Penicillins Rash    Has patient had a PCN reaction causing immediate rash, facial/tongue/throat swelling, SOB or lightheadedness with hypotension: No (rash on legs after 3 days [place] -June 2016) Has patient had a PCN reaction causing severe rash involving mucus membranes or skin necrosis: No Has patient had a PCN reaction that required hospitalization No Has patient had a PCN reaction occurring within the last 10 years: Yes If all of the above answers are "NO", then may proceed with Cephalosporin use.    The neurologically relevant items on the patient's problem list were reviewed on today's visit.  Neurologic Examination  A problem focused neurological exam (12 or more points of the single system neurologic examination, vital signs counts as 1 point, cranial nerves count for 8 points) was performed.  Blood pressure (!) 92/59, pulse 68, height 5\' 1"  (1.549 m), weight 206 lb 1.6 oz (93.5 kg).  General - Well nourished, well developed, in no apparent distress.  Ophthalmologic - Fundi not visualized due to noncooperation.  Cardiovascular - Regular rate and rhythm with no murmur.  Mental Status -  Level of arousal and orientation to time, place, and person  were intact. Language including expression, naming, repetition, comprehension was assessed and found intact. Fund of Knowledge was assessed and was intact.  Cranial Nerves II - XII - II - Visual field intact OU. III, IV, VI - Extraocular movements intact. V - Facial sensation intact bilaterally. VII - left facial droop. VIII - Hearing & vestibular intact bilaterally. X - Palate elevates symmetrically. XI - Chin turning & shoulder shrug intact bilaterally. XII - Tongue protrusion intact.  Motor Strength - The patient's strength was 3+/5 left UE with difficulty finger extension and 4+/5 left LE proximal and 5/5 distal, 5/5 RUE and RLE.  Bulk was normal and fasciculations were absent.   Motor Tone - Muscle tone was assessed at the neck and appendages and was increased at left elbow and left fingers.  Reflexes - The patient's reflexes were 1+ in all extremities and she had no pathological reflexes.  Sensory - Light touch, temperature/pinprick were assessed and were normal.    Coordination - The patient had normal movements in the right hand with no ataxia or dysmetria.  Tremor was absent.  Gait and Station - in wheelchair, patient stated that she can walk with 4 leg cane at facility.   Data reviewed: I personally reviewed the images and agree with the radiology interpretations.  Dg Chest 2 View 03/10/2016   No active cardiopulmonary disease. Stable cardiomegaly.   Ct Head Wo Contrast 03/10/2016   Acute right MCA infarct involving the insular cortex and adjacent right frontal and temporal lobes. No hemorrhage.   Ct Cervical Spine Wo Contrast 03/10/2016  No acute bony abnormality in the cervical spine.   Mr Brain Wo Contrast 03/10/2016   Extensive areas of both focal and confluent restricted diffusion throughout the RIGHT MCA distribution representing acute nonhemorrhagic infarction. Suspected RIGHT M1 MCA long segment stenosis or occlusion. Atrophy with small vessel disease. Areas of  chronic infarction, most notable LEFT insula and LEFT cerebellum.   CTA head and neck 03/11/2016 Non stenotic extracranial carotid bifurcation disease, consisting of calcific plaque. Severe stenosis of the RIGHT MCA M2 superior division vessel, corresponding with the observed areas of infarction. Developing nonhemorrhagic cytotoxic edema, RIGHT hemisphere, without significant mass effect.  TTE - - Very limited study. Only a few parasternal window images were  obtained, after which the patient was unable to tolerate the  study.  The left ventricle appears normal in overall size and function,  but wall motion cannot be analyzed.  The left atrium is athe upper limit of normal in size.  The mitral valve appears grossly normal.  The other valves cannot be evaluated. The right heart chambers  are not seen. Probably no pericardial effusion.  TEE - No cardiac source of embolism. LVH and LA dilation present. Mild aortic arch plaque.  LE venous doppler - No evidence of DVT, superficial thrombosis, or Baker's Cyst.  Repeat limited MRI - 1. Mild extension of the acute right MCA infarct since 03/10/2016. Increased cytotoxic edema, but no associated hemorrhage or significant intracranial mass effect at this time. 2. No new intracranial abnormality.  Component     Latest Ref Rng & Units  03/11/2016  Cholesterol     0 - 200 mg/dL 138  Triglycerides     <150 mg/dL 232 (H)  HDL Cholesterol     >40 mg/dL 29 (L)  Total CHOL/HDL Ratio     RATIO 4.8  VLDL     0 - 40 mg/dL 46 (H)  LDL (calc)     0 - 99 mg/dL 63  Hemoglobin A1C     4.8 - 5.6 % 7.3 (H)  Mean Plasma Glucose     mg/dL 163  TSH     0.350 - 4.500 uIU/mL 10.113 (H)    Assessment: As you may recall, she is a 65 y.o. Caucasian female with PMH of HTN, DM, HLD, CAD, MI, obesity, migraine on Topamax, OSA on CPAP admitted on 03/10/2016 for right MCA infarct with suspect right M1 stenosis/occlusion. CTA head and neck showed severe  stenosis right M2 superior branch. TTE, TEE, and DVT negative. Loop recorder placed so far no afib. LDL 63 and A1c 7.1. Patient aspirin changed to Plavix, and continue Crestor and fenofibrate. However, patient had worsening weakness to left hemiplegia associated with hypotension. Repeat MRI showed extension of right MCA territory infarcts. Her BP much improved after discontinue Norvasc and cozaar, as well as decreased Coreg to 6.25 bid and IV fluid. She was discharged to [facility] and then SNF. Her left-sided weakness much improved. She is able to walk at SNF with 4 leg cane now. However BP still on the low side, will further decrease coreg dose. Continue lasix 10mg  for left hand and leg swelling.    Plan:  - continue plavix and crestor for stroke prevention - check BP at facility and avoid low BP, BP goal 120-150 - check glucose at facility  - elevate left UE and LE to improve swelling - follow up with Dr. Letta Pate regarding left UE weakness and spasticity and consider botox - decrease coreg from 6.25mg  twice a day to 3.125mg  twice a day - follow up with Dr. Brett Fairy for OSA and CPAP treatment - Follow up with your primary care physician for stroke risk factor modification. Recommend maintain blood pressure goal 120-150/80, diabetes with hemoglobin A1c goal below 7.0% and lipids with LDL cholesterol goal below 70 mg/dL.  - continue loop recorder monitoring - follow up in 4 months.  I spent more than 25 minutes of face to face time with the patient. Greater than 50% of time was spent in counseling and coordination of care. We discussed BP goal, decreased coreg does, follow up with PMR and aggressive working with therapy.   No orders of the defined types were placed in this encounter.   Meds ordered this encounter  Medications  . DISCONTD: FLUoxetine (PROZAC) 10 MG tablet  . HYDROcodone-acetaminophen (NORCO) 10-325 MG tablet    Patient Instructions  - continue plavix and crestor for stroke  prevention - check BP at facility and avoid low BP, BP goal 120-150 - check glucose at facility  - elevate left UE and LE to improve swelling - follow up with Dr. Letta Pate regarding left UE weakness and spasticity and consider botox - decrease coreg from 6.25mg  twice a day to 3.125mg  twice a day - follow up with Dr. Brett Fairy for OSA and CPAP treatment - Follow up with your primary care physician for stroke risk factor modification. Recommend maintain blood pressure goal 120-150/80, diabetes with hemoglobin A1c goal below 7.0% and lipids with LDL cholesterol goal below 70 mg/dL.  - continue aggressive therapy to improve left side  strength and also self exercise - continue loop recorder monitoring - follow up in 4 months.   Rosalin Hawking, MD PhD [facility] [address] [place], [place] [address] [phone]

## 2016-07-15 ENCOUNTER — Encounter: Payer: Medicare Other | Attending: Physical Medicine & Rehabilitation

## 2016-07-15 ENCOUNTER — Ambulatory Visit (HOSPITAL_BASED_OUTPATIENT_CLINIC_OR_DEPARTMENT_OTHER): Payer: Medicare Other | Admitting: Physical Medicine & Rehabilitation

## 2016-07-15 ENCOUNTER — Encounter: Payer: Self-pay | Admitting: Physical Medicine & Rehabilitation

## 2016-07-15 VITALS — BP 116/78 | HR 64 | Resp 14

## 2016-07-15 DIAGNOSIS — Z5189 Encounter for other specified aftercare: Secondary | ICD-10-CM | POA: Diagnosis present

## 2016-07-15 DIAGNOSIS — G8114 Spastic hemiplegia affecting left nondominant side: Secondary | ICD-10-CM | POA: Diagnosis not present

## 2016-07-15 DIAGNOSIS — E119 Type 2 diabetes mellitus without complications: Secondary | ICD-10-CM | POA: Insufficient documentation

## 2016-07-15 DIAGNOSIS — E669 Obesity, unspecified: Secondary | ICD-10-CM | POA: Diagnosis not present

## 2016-07-15 DIAGNOSIS — I639 Cerebral infarction, unspecified: Secondary | ICD-10-CM | POA: Diagnosis not present

## 2016-07-15 DIAGNOSIS — I69354 Hemiplegia and hemiparesis following cerebral infarction affecting left non-dominant side: Secondary | ICD-10-CM | POA: Insufficient documentation

## 2016-07-15 DIAGNOSIS — G90512 Complex regional pain syndrome I of left upper limb: Secondary | ICD-10-CM | POA: Insufficient documentation

## 2016-07-15 DIAGNOSIS — I10 Essential (primary) hypertension: Secondary | ICD-10-CM | POA: Insufficient documentation

## 2016-07-15 DIAGNOSIS — G8929 Other chronic pain: Secondary | ICD-10-CM | POA: Diagnosis present

## 2016-07-15 DIAGNOSIS — E785 Hyperlipidemia, unspecified: Secondary | ICD-10-CM | POA: Diagnosis not present

## 2016-07-15 NOTE — Patient Instructions (Signed)
Botox would be helpful to relax fingers and also help partially with pain. Recommend increased dose of gabapentin to 400 mg 3 times a day for the hypersensitivity in the left hand. Would also recommend elevating the left arm in an arm trough when in a wheelchair or on pillows when in bed or in a recliner. Also recommend massaging hand from fingers to wrist twice a day with lotion

## 2016-07-15 NOTE — Progress Notes (Signed)
Subjective:    Patient ID: Danielle Vargas, female    DOB: [birth year], 65 y.o.   MRN: [id number]  HPI At [facility] Still receiving SNF level PT.  Noticing increased Tone left hand and fingers  Now using quad cane Mod I/Sup  Diabetic control is good, HgbA1C 6.2 Losing weight slowly  Pain Inventory Average Pain 8 Pain Right Now 7 My pain is intermittent  In the last 24 hours, has pain interfered with the following? General activity 7 Relation with others 7 Enjoyment of life 4 What TIME of day is your pain at its worst? evening Sleep (in general) Good  Pain is worse with: some activites Pain improves with: rest and medication Relief from Meds: 8  Mobility use a cane use a walker  Function disabled: date disabled .  Neuro/Psych weakness trouble walking spasms  Prior Studies Any changes since last visit?  no  Physicians involved in your care Any changes since last visit?  no   Family History  Problem Relation Age of Onset  . COPD Father   . Stroke Father   . Emphysema Father   . Heart failure Mother   . Heart attack Mother     several  . Diabetes Mother   . Kidney disease Mother   . Heart failure Brother   . Hypertension Brother   . Colon cancer  45    mat. 1st cousin  . Other      celiac sprue, 1/2 brother  . Diabetes Brother     x 3  . Lung cancer Brother   . Cirrhosis Sister     liver transplant  . Liver disease Sister     transplant  . Heart attack Paternal Grandfather   . Heart attack Brother   . Colon cancer Paternal Aunt 34  . Esophageal cancer Neg Hx   . Stomach cancer Neg Hx   . Rectal cancer Neg Hx    Social History   Social History  . Marital status: Divorced    Spouse name: N/A  . Number of children: 2  . Years of education: N/A   Occupational History  . disabled Unemployed   Social History Main Topics  . Smoking status: Never Smoker  . Smokeless tobacco: Never Used  . Alcohol use No  . Drug use: No  . Sexual activity:  Not Currently    Birth control/ protection: Post-menopausal   Other Topics Concern  . None   Social History Narrative   Lives in [place] alone.  Retired.  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   Regular exercise: none   Caffeine use: daily; dt coke            Past Surgical History:  Procedure Laterality Date  . BREAST REDUCTION SURGERY  '88  . CARPAL TUNNEL RELEASE  2000   right wrist  . CERVICAL DISC SURGERY  '98   fusion  . CESAREAN SECTION  '79  '84  . COLONOSCOPY    . EP IMPLANTABLE DEVICE N/A 03/12/2016   Procedure: Loop Recorder Insertion;  Surgeon: Will Meredith Leeds, MD;  Location: [place] CV LAB;  Service: Cardiovascular;  Laterality: N/A;  . LEFT HEART CATHETERIZATION WITH CORONARY ANGIOGRAM N/A 10/26/2013   Procedure: LEFT HEART CATHETERIZATION WITH CORONARY ANGIOGRAM;  Surgeon: Sinclair Grooms, MD;  Location: [facility] CATH LAB;  Service: Cardiovascular;  Laterality: N/A;  . ROTATOR CUFF REPAIR  '09   left shoulder  . TEE WITHOUT CARDIOVERSION N/A 03/12/2016  Procedure: TRANSESOPHAGEAL ECHOCARDIOGRAM (TEE);  Surgeon: Sanda Klein, MD;  Location: [facility] ENDOSCOPY;  Service: Cardiovascular;  Laterality: N/A;  . TUBAL LIGATION  '85  . ULNAR NERVE REPAIR  '02, '08   left done, then right   Past Medical History:  Diagnosis Date  . Allergy   . Asthma   . CAD (coronary artery disease)    a. 12/2008 Cath: mild irregs throughout, EF 75%.  . Cataract    "just beginning"  . Colon polyps 7/07   hyperplastic and adenomatous  . Depression    pt unsure, psychologist said no  . Diverticulosis   . DM (diabetes mellitus) ([place])   . Dyslipidemia   . External hemorrhoid   . Fatty liver   . Gastroparesis   . HTN (hypertension)   . Hyperlipidemia   . 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 Hypothyroidism   . Migraine   . Myocardial infarction    FEb 2015  . Nephrolithiasis   . Obesity   . Osteoporosis   . Sleep apnea    wears CPAP  . Stroke ([place])     BP 116/78   Pulse 64   Resp 14   SpO2 95%   Opioid Risk Score:   Fall Risk Score:  `1  Depression screen PHQ 2/9  Depression screen [facility] 2/9 07/15/2016 09/21/2015 09/18/2014 12/26/2013  Decreased Interest 0 0 1 0  Down, Depressed, Hopeless 0 0 1 1  PHQ - 2 Score 0 0 2 1    Review of Systems  All other systems reviewed and are negative.      Objective:   Physical Exam  Constitutional: She appears well-developed and well-nourished.  HENT:  Head: Normocephalic and atraumatic.  Eyes: Conjunctivae and EOM are normal. Pupils are equal, round, and reactive to light.  Neck: Normal range of motion.  Musculoskeletal:       Left wrist: She exhibits decreased range of motion. She exhibits no tenderness, no effusion and no deformity.       Left hand: She exhibits decreased range of motion, tenderness and swelling. Decreased strength noted.  Neurological: She displays no tremor. Coordination and gait abnormal.  Her strength is trace deltoid, biceps, triceps, 0 at the finger flexors and extensors Left lower extremity 3 minus, hip flexor, knee extensor and ankle dorsiflexor. Right side is 5 minus in the deltoid, biceps, triceps, grip, hip flexor, knee extensor, ankle dorsiflexor.  Ambulates with a quad cane. No evidence of toe drag or knee instability. Slow gait. Short stance phase on the left side  Psychiatric: She has a normal mood and affect.  Nursing note and vitals reviewed.  [place] 3 at the biceps, 3 at the finger flexors but not at the thumb flexor. [place] 2 at the wrist flexors  Hypersensitive to touch over the dorsum of the hand, left     Assessment & Plan:  1. 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 As discussed with patient. I do not expect much more spontaneous neurologic recovery of the left upper extremity. She has 2 issues that may be impacting her recovery to some degree, do think the spasticity is impeding her range of motion in the left  upper extremity. Recommend  Recommend Botox at following doses Left FDS 50 units Left FDP 50 units Left biceps 50 units Left FCR 25 units Left FCU 25 units  2. Left upper extremity pain, shoulder, hand syndrome. Will increase gabapentin to 400 3 times a day, retrograde massage  discussed with patient elevation in arm trough while in wheelchair or pillow while seated in a Geri chair Consider Medrol Dosepak. If this is not helpful, patient is a diabetic. She is reluctant to take steroids

## 2016-07-19 LAB — CUP PACEART REMOTE DEVICE CHECK
Implantable Pulse Generator Implant Date: 20170705
MDC IDC SESS DTM: 20171003144702

## 2016-07-19 NOTE — Progress Notes (Signed)
Carelink summary report received. Battery status OK. Normal device function. No new symptom episodes, tachy episodes, brady, or pause episodes. No new AF episodes. Monthly summary reports and ROV/PRN 

## 2016-07-24 ENCOUNTER — Encounter: Payer: Self-pay | Admitting: Internal Medicine

## 2016-07-24 ENCOUNTER — Ambulatory Visit (INDEPENDENT_AMBULATORY_CARE_PROVIDER_SITE_OTHER): Payer: Medicare Other | Admitting: Internal Medicine

## 2016-07-24 VITALS — BP 130/80 | HR 69 | Wt 203.0 lb

## 2016-07-24 DIAGNOSIS — E109 Type 1 diabetes mellitus without complications: Secondary | ICD-10-CM | POA: Diagnosis not present

## 2016-07-24 DIAGNOSIS — E139 Other specified diabetes mellitus without complications: Secondary | ICD-10-CM

## 2016-07-24 DIAGNOSIS — I639 Cerebral infarction, unspecified: Secondary | ICD-10-CM | POA: Diagnosis not present

## 2016-07-24 MED ORDER — INSULIN GLARGINE 100 UNIT/ML ~~LOC~~ SOLN: SUBCUTANEOUS | Status: DC

## 2016-07-24 MED ORDER — INSULIN ASPART 100 UNIT/ML ~~LOC~~ SOLN: 10.0000 [IU] | Freq: Three times a day (TID) | SUBCUTANEOUS | Status: DC

## 2016-07-24 NOTE — Patient Instructions (Addendum)
Please increase: - Lantus 30 units in am and 20 at bedtime - Novolog:  Before b'fast: 20 units  Before lunch: 25 units  Before dinner: 20 units  If sugars before a meal 70-90, then only give Novolog 10 units before that meal. If sugars before a meal <70, then skip Novolog before that meal.  Please send me a sugar log in 1 week for further insulin adjustment.  Please return in 1.5 months with your sugar log.

## 2016-07-24 NOTE — Progress Notes (Signed)
Patient ID: Danielle Vargas, female   DOB: [birth year], 65 y.o.   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 Last visit 1.5 mo ago.  She has [name].  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 She is still in wheelchair, but can walk slowly with cane. Still in [place]. She is working with Dr. Read Drivers >> now in rehab.  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  Previous regimen: - Metformin 1000 mg bid  - U500 0.11-0.11-0.10 units before meals >> 0.09 mL tid  She has been on Glipizide in the past, too. She tried Invokana in the past >> severe diarrhea  She was on: - Lantus 45 units 2x a day. - Novolog 30 units before each meal.  If sugars before a meal <90, then only give Novolog 15 units before that meal. If sugars before a meal <70, then skip Novolog before that meal.  Doses changed by Dr. Sharlett Iles after pts had few lows in am in the 87s: - Lantus 15 units in am and 10 at bedtime - Novolog 10 units before B, 20 units before L and D  Pt checks her sugars 3x a day: - am:154-246, 257 >> 165-210 >> 161-240, 351 >> 99, 128-172, 178, 198 >> 180-303 - 2h after b'fast: 134-246 >> 152-266 >> n/c >> 114-207 >> 115-205, 239 >> n/c - before lunch:119, 167-318 >> 108-256 >> 141, 174-206, 229, 326 >> 148-221 >> 216-328 - 2h after lunch:90-308 >> n/c >> 83, 126-199 >> 57x1, 99-247 >> n/c - before dinner: 152-266, 315 >> 134-231, 252 >> 101-238 >> 77,  110-246, 272 >> 190-292 - 2h after dinner: 128-243 >> 184-235 >> n/c >> 111-214 >> 109-273, 339 >> n/c - bedtime:147-223 >> 133-300 >> 191-260, 289 >> 140-216, 252 >> 126-378 >> n/c >> 180-295 - nighttime: 161-310 >> n/c >> 75-232 >> 136-291 >> n/c few lows - lowest 77 >> ; she has hypoglycemia awareness at 95.   Highest sugar was 286 >> .  Pt's meals are: - Breakfast: 2 slices of bread + PB - Lunch: sandwich;  salad + cheese + croutons;  baked potato >> pinto beans + cornbread - Dinner: leftovers or corn bread + pinto beans + occas. Cole slaw >> salad + lite dressing + saltines Not soft drinks anymore. Snacks at night  - has mild CKD, last BUN/creatinine:  Lab Results  Component Value Date   BUN 19 03/17/2016   CREATININE 0.94 03/17/2016  She is back on Losartan. - pt has HL:  02/2015: TG 252 (PCP)  Lab Results  Component Value Date   CHOL 138 03/11/2016   HDL 29 (L) 03/11/2016   LDLCALC 63 03/11/2016   TRIG 232 (H) 03/11/2016   CHOLHDL 4.8 03/11/2016  She is on Fenofibrate. - last eye exam was in 02/20/2016 - Dr Zigmund Daniel. R>L DR. No changes.   - + numbness and tingling in her feet. On neurontin.  She also has a history of HTN, HL, fatty liver, GERD, morbid obesity, hypothyroidism,  migraines, chronic back pain, history of nephrolithiasis, osteoporosis, diverticulosis. She was recently dx'ed with OSA >> on CPAP.  Since had pancreatitis (03/21/2015).   She has been admitted for CP 11/13/2015 >> r/o for AMI. She saw Dr Sallyanne Kuster in the hospital, her cardiologist is Dr. Tamala Julian.  I reviewed pt's medications, allergies, PMH, social hx, family hx, and changes were documented in the history of present illness. Otherwise, unchanged from my initial visit note.  ROS: Constitutional: no weight gain/loss, no fatigue, no subjective hyperthermia/hypothermia Eyes: no blurry vision, no xerophthalmia ENT: no sore throat, no nodules palpated in throat, no dysphagia/odynophagia, no  hoarseness Cardiovascular: no CP/SOB/palpitations/leg swelling Respiratory: no cough/SOB/wheezing Gastrointestinal: no N/V/D/C Musculoskeletal: no muscle/joint aches Skin: no rashes Neurological: no tremors/+ numbness/no tingling/dizziness - paresis in L arm but can move L fingers  PE: BP 130/80   Pulse 69   Wt 203 lb (92.1 kg)   SpO2 97%   BMI 38.36 kg/m  Body mass index is 38.36 kg/m. Wt Readings from Last 3 Encounters:  07/24/16 203 lb (92.1 kg)  07/10/16 206 lb 1.6 oz (93.5 kg)  06/05/16 208 lb (94.3 kg)   Constitutional: overweight, in NAD, in wheelchair Eyes: PERRLA, EOMI, no exophthalmos ENT: moist mucous membranes, no thyromegaly, no cervical lymphadenopathy Cardiovascular: RRR, No MRG Respiratory: CTA B Gastrointestinal: abdomen soft, NT, ND, BS+ Musculoskeletal: + L arm and leg edematous, L hemiparesis Skin: moist, warm, no rashes  ASSESSMENT: 1. DM2, insulin-dependent, uncontrolled, with complications - CAD - AMI 10/26/2013 - had angioplasty, no stent placed. Had 2DEcho on 01/23/2014 EF increased (from 35) to 55%. - peripheral neuropathy, on Neurontin - Gastroparesis, on Reglan - Dr. 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  PLAN:  1. Patient with long-standing uncontrolled diabetes. At last visit, we decreased Lantus and increased NovoLog, however, she had few low am CBGs in the 50s (she believes these were 2/2 a gastroenteritis) and her insulin levels were greatly reduced >> subsequent sugars 200-300s. Will increase both insulins now and have the SNF send me the log again in 1 week. - at last visit, HbA1c was better, 6.2%  -  I suggested:  Patient Instructions  Please increase: - Lantus 30 units in am and 20 at bedtime - Novolog:  Before b'fast: 20 units  Before lunch: 25 units  Before dinner: 20 units  If sugars before a meal 70-90, then only give Novolog 10 units before that meal. If sugars before a  meal <70, then skip Novolog before that meal.  Please return in 1.5 months with your sugar log.   - continue checking sugars 4x a day - UTD with eye exam - UTD with flu shot - Return to clinic in 1.5 mo with sugar log   Philemon Kingdom, MD PhD [facility] Endocrinology

## 2016-07-29 ENCOUNTER — Ambulatory Visit (INDEPENDENT_AMBULATORY_CARE_PROVIDER_SITE_OTHER): Payer: Medicare Other | Admitting: Sports Medicine

## 2016-07-29 ENCOUNTER — Encounter: Payer: Self-pay | Admitting: Sports Medicine

## 2016-07-29 DIAGNOSIS — E119 Type 2 diabetes mellitus without complications: Secondary | ICD-10-CM | POA: Diagnosis not present

## 2016-07-29 DIAGNOSIS — M79676 Pain in unspecified toe(s): Secondary | ICD-10-CM | POA: Diagnosis not present

## 2016-07-29 DIAGNOSIS — B351 Tinea unguium: Secondary | ICD-10-CM

## 2016-07-29 DIAGNOSIS — R531 Weakness: Secondary | ICD-10-CM

## 2016-07-29 DIAGNOSIS — Z8673 Personal history of transient ischemic attack (TIA), and cerebral infarction without residual deficits: Secondary | ICD-10-CM

## 2016-07-29 NOTE — Progress Notes (Signed)
Patient ID: Danielle Vargas, female   DOB: Jan 08, 1951, 65 y.o.   MRN: [id number]   Subjective: Danielle Vargas is a 65 y.o. female patient with history of type 2 diabetes who presents to office today complaining of long, painful nails  while ambulating in shoes with use of wheelchair; unable to trim. Patient states that the glucose reading this morning was good had recent change with meds and is s/p stroke that happened in July that left her with left sided weakness and swelling. Patient is now at [facility]. Patient denies any new cramping, numbness, burning or tingling in the legs.  Patient Active Problem List   Diagnosis Date Noted  . 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 Left spastic hemiparesis ([place]) 07/15/2016  . Bursitis of right hip 04/01/2016  . DM type 2 with diabetic peripheral neuropathy ([place])   . Vascular headache   . Right middle cerebral artery stroke ([place]) 03/14/2016  . Asthma   . Acute lower UTI   . Benign essential HTN   . HLD (hyperlipidemia)   . S/P rotator cuff repair   . Tachypnea   . AKI (acute kidney injury) ([place])   . Acute right MCA stroke ([place])   . Back pain   . Ischemic stroke ([place]) 03/10/2016  . Stroke (cerebrum) ([place]) 03/10/2016  . Chest pain, rule out acute myocardial infarction 11/10/2015  . Chest pain 11/10/2015  . Obesity   . Hypothyroidism   . 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 UTI (urinary tract infection) 06/16/2015  . Diabetes mellitus ([place]) 06/16/2015  . RUQ abdominal pain 04/17/2015  . Hx of colonic polyps 04/17/2015  . Family hx of colon cancer 04/17/2015  . CPAP rhinitis 12/08/2014  . OSA on CPAP 12/08/2014  . 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 Retinopathy due to secondary diabetes mellitus, without macular edema, with moderate nonproliferative retinopathy ([place]) 09/18/2014  . Neuropathy due to secondary diabetes mellitus ([place]) 09/18/2014  . DM (diabetes mellitus) ([place]) 09/18/2014  . Obesity  hypoventilation syndrome ([place]) 09/18/2014  . CAD in native artery 01/23/2014  . Morbid obesity ([place]) 01/23/2014  . Renal insufficiency 11/17/2013  . HTN (hypertension) 10/29/2013  . Chronic diastolic heart failure ([place]) 10/29/2013  . 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 LADA (latent autoimmune diabetes in adults), managed as type 1 ([place]) 08/08/2013   Current Outpatient Prescriptions on File Prior to Visit  Medication Sig Dispense Refill  . acetaminophen (TYLENOL) 325 MG tablet Take 2 tablets (650 mg total) by mouth every 6 (six) hours as needed for mild pain (or temp >/= 99.5 F).    Marland Kitchen albuterol (PROVENTIL HFA;VENTOLIN HFA) 108 (90 BASE) MCG/ACT inhaler Inhale 2 puffs into the lungs every 6 (six) hours as needed for wheezing or shortness of breath.     . carvedilol (COREG) 3.125 MG tablet Take 1 tablet by mouth 2 (two) times daily.    . clopidogrel (PLAVIX) 75 MG tablet Take 1 tablet (75 mg total) by mouth daily. 30 tablet 0  . docusate sodium (COLACE) 100 MG capsule Take 1 capsule (100 mg total) by mouth 2 (two) times daily. 10 capsule 0  . fenofibrate 160 MG tablet Take 160 mg by mouth at bedtime.     Marland Kitchen FLUoxetine (PROZAC) 10 MG capsule Take 1 capsule (10 mg total) by mouth daily.  3  . furosemide (LASIX) 20 MG tablet Take 0.5 tablets (10 mg total) by mouth daily. 30 tablet   . gabapentin (NEURONTIN) 300 MG capsule Take 300  mg by mouth 3 (three) times daily.    Marland Kitchen HYDROcodone-acetaminophen (NORCO) 10-325 MG tablet 1 tablet every 6 (six) hours as needed.     . insulin aspart (NOVOLOG) 100 UNIT/ML injection Inject 10-25 Units into the skin 3 (three) times daily with meals.    . insulin glargine (LANTUS) 100 UNIT/ML injection Inject 30 units in am and 20 units at bedtime.    Marland Kitchen levothyroxine (SYNTHROID, LEVOTHROID) 100 MCG tablet Take 1 tablet (100 mcg total) by mouth daily before breakfast.    . Menthol-Methyl Salicylate (MUSCLE RUB) 10-15 % CREA Apply 1 application topically 2 (two) times  daily as needed for muscle pain. For right hip pain  0  . metoCLOPramide (REGLAN) 10 MG tablet Take 10 mg by mouth 4 (four) times daily.     . mometasone (NASONEX) 50 MCG/ACT nasal spray Place 2 sprays into the nose daily. 17 g 12  . mometasone-formoterol (DULERA) 200-5 MCG/ACT AERO Inhale 2 puffs into the lungs 2 (two) times daily.    . montelukast (SINGULAIR) 10 MG tablet Take 10 mg by mouth at bedtime.    . Multiple Vitamin (MULTIVITAMIN WITH MINERALS) TABS tablet Take 1 tablet by mouth daily. One A Day 50+    . nitroGLYCERIN (NITROSTAT) 0.4 MG SL tablet Place 1 tablet (0.4 mg total) under the tongue every 5 (five) minutes as needed for chest pain. 25 tablet 3  . pantoprazole (PROTONIX) 40 MG tablet Take 1 tablet (40 mg total) by mouth daily.    . polyethylene glycol (MIRALAX / GLYCOLAX) packet Take 17 g by mouth every other day.     . rosuvastatin (CRESTOR) 20 MG tablet Take 1 tablet (20 mg total) by mouth daily.    Marland Kitchen tiZANidine (ZANAFLEX) 2 MG tablet Take 2 mg by mouth every 8 (eight) hours as needed for muscle spasms.     Marland Kitchen topiramate (TOPAMAX) 50 MG tablet Take 50 mg by mouth 2 (two) times daily.     No current facility-administered medications on file prior to visit.    Allergies  Allergen Reactions  . Invokana [Canagliflozin] Diarrhea  . Nsaids Other (See Comments)    GI problems/ stomach pain  . Penicillins Rash    Has patient had a PCN reaction causing immediate rash, facial/tongue/throat swelling, SOB or lightheadedness with hypotension: No (rash on legs after 3 days [place] -June 2016) Has patient had a PCN reaction causing severe rash involving mucus membranes or skin necrosis: No Has patient had a PCN reaction that required hospitalization No Has patient had a PCN reaction occurring within the last 10 years: Yes If all of the above answers are "NO", then may proceed with Cephalosporin use.    Objective: General: Patient is awake, alert, and oriented x 3 and in no acute  distress.  Integument: Skin is warm, dry and supple bilateral. Nails are tender, long, thickened and  dystrophic with subungual debris, consistent with onychomycosis, 1-5 bilateral. No signs of infection. No open lesions or preulcerative lesions present bilateral. Remaining integument unremarkable.  Vasculature:  Dorsalis Pedis pulse 1/4 bilateral. Posterior Tibial pulse  1/4 bilateral. Capillary fill time <3 sec 1-5 bilateral. Scant hair growth to the level of the digits. Temperature gradient within normal limits. No varicosities present bilateral. Trace edema present bilateral, L>R.   Neurology: The patient has diminished sensation measured with a 5.07/10g Semmes Weinstein Monofilament at all pedal sites bilateral. Vibratory sensation intact bilateral with tuning fork. No Babinski sign present bilateral.   Musculoskeletal: No gross  pedal deformities noted bilateral. Muscular strength 4/5 left on 5/5 on right in all lower extremity muscular groups bilateral without pain or limitation on range of motion. No tenderness with calf compression bilateral.  Assessment and Plan: Problem List Items Addressed This Visit    None    Visit Diagnoses    Dermatophytosis of nail    -  Primary   Pain of toe, unspecified laterality       Diabetes mellitus without complication (HCC)       Left-sided weakness       History of stroke         -Examined patient. -Discussed and educated patient on diabetic foot care, especially with  regards to the vascular, neurological and musculoskeletal systems.  -Stressed the importance of good glycemic control and the detriment of not  controlling glucose levels in relation to the foot. -Mechanically debrided all nails 1-5 bilateral using sterile nail nipper and filed with dremel without incident -Recommend elevation of legs when sitting to assist with edema control  -Answered all patient questions -Patient to return  in 3 months for at risk foot care -Patient  advised to call the office if any problems or questions arise in the meantime.  Landis Martins, DPM

## 2016-07-30 ENCOUNTER — Encounter: Payer: Self-pay | Admitting: Neurology

## 2016-07-30 ENCOUNTER — Ambulatory Visit (INDEPENDENT_AMBULATORY_CARE_PROVIDER_SITE_OTHER): Payer: Medicare Other | Admitting: Neurology

## 2016-07-30 VITALS — BP 154/67 | HR 75

## 2016-07-30 DIAGNOSIS — G4733 Obstructive sleep apnea (adult) (pediatric): Secondary | ICD-10-CM | POA: Insufficient documentation

## 2016-07-30 DIAGNOSIS — I69398 Other sequelae of cerebral infarction: Secondary | ICD-10-CM | POA: Diagnosis not present

## 2016-07-30 DIAGNOSIS — G4737 Central sleep apnea in conditions classified elsewhere: Secondary | ICD-10-CM | POA: Diagnosis not present

## 2016-07-30 DIAGNOSIS — I639 Cerebral infarction, unspecified: Secondary | ICD-10-CM

## 2016-07-30 DIAGNOSIS — I63511 Cerebral infarction due to unspecified occlusion or stenosis of right middle cerebral artery: Secondary | ICD-10-CM | POA: Diagnosis not present

## 2016-07-30 NOTE — Progress Notes (Signed)
SLEEP MEDICINE CLINIC   Provider:  Larey Seat, M D  Referring Provider: Marton Redwood, MD Primary Care Physician:  Marton Redwood, MD  Chief Complaint  Patient presents with  . Follow-up    hasn't been using cpap    HPI:  Danielle Vargas is a 65 y.o. female , seen here as a revisit from Dr Brigitte Pulse and Dr Erlinda Hong, The patient has been last seen in April 2016 in the sleep clinic. She is followed for diabetes by Dr. Benjiman Core, Dr. Carmie Kanner is her primary care physician, who referred her originally to me because she was excessively daytime sleepy, had asthmatic pulmonary wheezing, short of breath, and she was morbidly obese. She was diagnosed with an allergy to cat hair. Her comorbidities and past medical history include neuropathy, nephropathy and gastroparesis due to diabetes mellitus, positive GAD antibodies, colon polyps, osteoporosis, nephrolithiasis, malignant hypertension and hyperlipidemia. Chronic depression, C-spine fusion and left rotator cuff repair surgeries. Severest Sleep Apnea, hypoxemia, nocturia. Epworth sleepiness score was endorsed at 18 points. Her sleep study revealed the highest AHI we had seen a long time at 142.9/ hr. . Sleep was not recorded in supine position.  CPAP was initiated at 6 and titrated to 15 cm water we put her on an autotitration. A nasal mask was fitted for the patient. By spring 2016 last year the patient was using her CPAP machine regularly. In the meantime she suffered a stroke which left her with left hemiparesis, she was admitted to [facility] no uses a wheelchair and a 4 pronged cane that she carries in her right, dominant hand. Her speech is unaffected. She is alert and awake. She can give me details of her recent hospitalization. She confesses that she has not been compliant with CPAP for several months. She stopped regular using it by mid spring 2017, but used it again during a hospitalization in July 2017. My nurse found the last  use of CPAP in July and August 2017. After her stroke she had to move to a nursing facility. She reports that she has support there - but she needs remind the caretakers to assist her with using the CPAP.  She was not using CPAP at the time she suffered her stroke. She does blame herself for it.  The very last use of CPAP according to her compliance data was in late July 2017 for 3 hours and 27 minutes with a residual AHI of 3.4 and a pressure of 18.6 cm water. Also she used to tolerate CPAP well, I think we may have to need to change her to a BiPAP machine. This is meant to give the patient comfort, her apneas are controlled on CPAP.    Chief complaint according to patient :  Sleep habits are as follows:       Sleep medical history and family sleep history:    Social history:    Dr Phoebe Sharps note : REASON FOR VISIT: stroke follow up  Today we had the pleasure of seeing Danielle Vargas in follow-up at our Neurology Clinic. Pt was accompanied by no one.   History Summary [name] is a 65 y.o. female with history of HTN, DM, HLD, CAD, MI, obesity, migraine , Topamax, OSA on CPAP admitted on 03/10/2016 for L sided weakness and left facial droop. MRI showed right MCA infarct with suspect right M1 stenosis/occlusion. There was also old infarct left insular and left cerebellum. CTA head and neck showed severe stenosis right M2 superior  branch. TTE, TEE, and DVT negative. Loop recorder placed. LDL 63 and A1c 7.1. Patient aspirin changed to Plavix, and continue Crestor and fenofibrate. However, patient had worsening weakness to left hemiplegia associated with hypotension. Repeat MRI showed extension of right MCA territory infarcts in her BP much improved after discontinue Norvasc and cozaar, as well as decreased Coreg to 6.25 bid and IV fluid. She was discharged to CIR after stabilization.  During the interval time, the patient has been doing better. She stayed in CIR for about 3 weeks and  then discharged to SNF. Her left-sided weakness much improved. Last Friday she was discharged home, however, at home her daughter not able to take care of her, therefore she was back to SNF this Monday. BP today 121/61. She is able to walk at SNF with hemi-walker. Continued on all the medication without side effect. Introduced  Haematologist trial and she is interested but will pursue once she is discharged to home.   Dr. Erlinda Hong,   Due to the stroke and the left-sided hemiparesis there have been changes in her ability to turn in bed find a comfortable position she is now wheelchair bound., She can transfer with a cane.  Dr Read Drivers is treating her spasticity.    Review of Systems: Out of a complete 14 system review, the patient complains of only the following symptoms, and all other reviewed systems are negative.    Social History   Social History  . Marital status: Divorced    Spouse name: N/A  . Number of children: 2  . Years of education: N/A   Occupational History  . disabled Unemployed   Social History Main Topics  . Smoking status: Never Smoker  . Smokeless tobacco: Never Used  . Alcohol use No  . Drug use: No  . Sexual activity: Not Currently    Birth control/ protection: Post-menopausal   Other Topics Concern  . Not on file   Social History Narrative   Lives in [facility] [place].  Retired.  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   Regular exercise: none   Caffeine use: daily; dt coke             Family History  Problem Relation Age of Onset  . COPD Father   . Stroke Father   . Emphysema Father   . Heart failure Mother   . Heart attack Mother     several  . Diabetes Mother   . Kidney disease Mother   . Heart failure Brother   . Hypertension Brother   . Colon cancer  45    mat. 1st cousin  . Other      celiac sprue, 1/2 brother  . Diabetes Brother     x 3  . Lung cancer Brother   . Cirrhosis Sister     liver transplant  . Liver disease Sister      transplant  . Heart attack Paternal Grandfather   . Heart attack Brother   . Colon cancer Paternal Aunt 21  . Esophageal cancer Neg Hx   . Stomach cancer Neg Hx   . Rectal cancer Neg Hx     Past Medical History:  Diagnosis Date  . Allergy   . Asthma   . CAD (coronary artery disease)    a. 12/2008 Cath: mild irregs throughout, EF 75%.  . Cataract    "just beginning"  . Colon polyps 7/07   hyperplastic and adenomatous  . Depression  pt unsure, psychologist said no  . Diverticulosis   . DM (diabetes mellitus) ([place])   . Dyslipidemia   . External hemorrhoid   . Fatty liver   . Gastroparesis   . HTN (hypertension)   . Hyperlipidemia   . 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 Hypothyroidism   . Migraine   . Myocardial infarction    FEb 2015  . Nephrolithiasis   . Obesity   . Osteoporosis   . Sleep apnea    wears CPAP  . Stroke [facility])     Past Surgical History:  Procedure Laterality Date  . BREAST REDUCTION SURGERY  '88  . CARPAL TUNNEL RELEASE  2000   right wrist  . CERVICAL DISC SURGERY  '98   fusion  . CESAREAN SECTION  '79  '84  . COLONOSCOPY    . EP IMPLANTABLE DEVICE N/A 03/12/2016   Procedure: Loop Recorder Insertion;  Surgeon: Will Meredith Leeds, MD;  Location: [facility] CV LAB;  Service: Cardiovascular;  Laterality: N/A;  . LEFT HEART CATHETERIZATION WITH CORONARY ANGIOGRAM N/A 10/26/2013   Procedure: LEFT HEART CATHETERIZATION WITH CORONARY ANGIOGRAM;  Surgeon: Sinclair Grooms, MD;  Location[facility] C[facility] LAB;  Service: Cardiovascular;  Laterality: N/A;  . ROTATOR CUFF REPAIR  '09   left shoulder  . TEE WITHOUT CARDIOVERSION N/A 03/12/2016   Procedure: TRANSESOPHAGEAL ECHOCARDIOGRAM (TEE);  Surgeon: Sanda Klein, MD;  Location: [facility] ENDOSCOPY;  Service: Cardiovascular;  Laterality: N/A;  . TUBAL LIGATION  '85  . ULNAR NERVE REPAIR  '02, '08   left done, then right    Current Outpatient Prescriptions  Medication Sig Dispense Refill  .  acetaminophen (TYLENOL) 325 MG tablet Take 2 tablets (650 mg total) by mouth every 6 (six) hours as needed for mild pain (or temp >/= 99.5 F).    Marland Kitchen albuterol (PROVENTIL HFA;VENTOLIN HFA) 108 (90 BASE) MCG/ACT inhaler Inhale 2 puffs into the lungs every 6 (six) hours as needed for wheezing or shortness of breath.     . carvedilol (COREG) 3.125 MG tablet Take 1 tablet by mouth 2 (two) times daily.    . clopidogrel (PLAVIX) 75 MG tablet Take 1 tablet (75 mg total) by mouth daily. 30 tablet 0  . docusate sodium (COLACE) 100 MG capsule Take 1 capsule (100 mg total) by mouth 2 (two) times daily. 10 capsule 0  . fenofibrate 160 MG tablet Take 160 mg by mouth at bedtime.     Marland Kitchen FLUoxetine (PROZAC) 10 MG capsule Take 1 capsule (10 mg total) by mouth daily.  3  . furosemide (LASIX) 20 MG tablet Take 0.5 tablets (10 mg total) by mouth daily. 30 tablet   . gabapentin (NEURONTIN) 300 MG capsule Take 300 mg by mouth 3 (three) times daily.    Marland Kitchen HYDROcodone-acetaminophen (NORCO) 10-325 MG tablet 1 tablet every 6 (six) hours as needed.     . insulin aspart (NOVOLOG) 100 UNIT/ML injection Inject 10-25 Units into the skin 3 (three) times daily with meals.    . insulin glargine (LANTUS) 100 UNIT/ML injection Inject 30 units in am and 20 units at bedtime.    Marland Kitchen levothyroxine (SYNTHROID, LEVOTHROID) 100 MCG tablet Take 1 tablet (100 mcg total) by mouth daily before breakfast.    . Menthol-Methyl Salicylate (MUSCLE RUB) 10-15 % CREA Apply 1 application topically 2 (two) times daily as needed for muscle pain. For right hip pain  0  . metoCLOPramide (REGLAN) 10 MG tablet Take 10 mg by mouth 4 (four)  times daily.     . mometasone (NASONEX) 50 MCG/ACT nasal spray Place 2 sprays into the nose daily. 17 g 12  . mometasone-formoterol (DULERA) 200-5 MCG/ACT AERO Inhale 2 puffs into the lungs 2 (two) times daily.    . montelukast (SINGULAIR) 10 MG tablet Take 10 mg by mouth at bedtime.    . Multiple Vitamin (MULTIVITAMIN WITH  MINERALS) TABS tablet Take 1 tablet by mouth daily. One A Day 50+    . nitroGLYCERIN (NITROSTAT) 0.4 MG SL tablet Place 1 tablet (0.4 mg total) under the tongue every 5 (five) minutes as needed for chest pain. 25 tablet 3  . pantoprazole (PROTONIX) 40 MG tablet Take 1 tablet (40 mg total) by mouth daily.    . polyethylene glycol (MIRALAX / GLYCOLAX) packet Take 17 g by mouth every other day.     . rosuvastatin (CRESTOR) 20 MG tablet Take 1 tablet (20 mg total) by mouth daily.    Marland Kitchen tiZANidine (ZANAFLEX) 2 MG tablet Take 2 mg by mouth every 8 (eight) hours as needed for muscle spasms.     Marland Kitchen topiramate (TOPAMAX) 50 MG tablet Take 50 mg by mouth 2 (two) times daily.     No current facility-administered medications for this visit.     Allergies as of 07/30/2016 - Review Complete 07/30/2016  Allergen Reaction Noted  . Invokana [canagliflozin] Diarrhea 09/02/2013  . Nsaids Other (See Comments) 02/08/2012  . Penicillins Rash 03/13/2015    Vitals: BP (!) 154/67   Pulse 75  Last Weight:  Wt Readings from Last 1 Encounters:  07/24/16 203 lb (92.1 kg)   [id number] is no height or weight on file to calculate BMI.     Last Height:  She had a weight of 201.4 pounds today at the nursing home. Ht Readings from Last 1 Encounters:  07/10/16 5\' 1"  (1.549 m)    Physical exam:  General: The patient is awake, alert and appears not in acute distress. The patient is well groomed. Head: Normocephalic, atraumatic. Neck is supple. Mallampati 3 , uvula is midline. There is no facial asymmetry noted neck circumference: 17. Nasal airflow patent,  Retrognathia is seen.  Cardiovascular:  Regular rate and rhythm, without  murmurs or carotid bruit, and without distended neck veins. Respiratory: Lungs are not congested.  Skin:  Without evidence of edema, or rash Trunk: BMI is superobese .    The patient has the marks of a loop recorder implant, she does not show any injuries bruising.  She has symmetric pupils,  not this rounded, normal extraocular eye movements, no ptosis, no facial droop, uvula and tongue were in midline, no chin tremor noted, left shoulder is droopy, left arm weaker, beginning spasticity in the left hand, she can fully extend her left lower extremity dorsiflex and plantarflex her feet, upgoing Babinski response, she can elevate the left arm but not fully extended, she does have grip strength weakness, pinch weakness in the left hand. The patient demonstrated her gait with left hemiparesis using a 4 pronged cane in her right hand she has a minimal step width, and did not present with a wide based gait. Her left foot is everted. Strong up-going Babinski.   The patient was advised of the nature of the diagnosed sleep disorder , the treatment options and risks for general a health and wellness arising from not treating the condition.  I spent more than 45 minutes of face to face time with the patient. Greater than 50% of time was  spent in counseling and coordination of care. We have discussed the diagnosis and differential and I answered the patient's questions.     Assessment:  After physical and neurologic examination, review of laboratory studies,  Personal review of imaging studies, reports of other /same  Imaging studies ,  Results of polysomnography/ neurophysiology testing and pre-existing records as far as provided in visit., my assessment is   1) Danielle Vargas was diagnosed in early 2015 with coronary artery disease and had a myocardial infarction in February. Soon after she was referred for a sleep study which was performed in Summer 2015 and she was diagnosed with one of the most severe apnea as I have seen. She was placed on an autotitrator CPAP with 3 cm EPR and initially tolerated the therapy very well. She became noncompliant over the month since April 2017, and suffered a stroke resulting in left hemiparesis and gait instability by July 1. She has been followed by my colleague Dr.  Erlinda Hong. A neurovascular specialist.  I want urgently for the patient to resume her CPAP therapy and if she feels that she cannot tolerate the CPAP I would switch her to BiPAP. Her last use the dates confirmed that the machine is still effective in reducing her AHI to 3.4, a very desirable result, given the high baseline.   Plan:  Treatment plan and additional workup :  Order new supplies, patient to resume CPAP auto therapy, RV with NP in 90 days. If needed, switch to BiPAP. She will have to use his CPAP every night from now on and I will put a note with [facility].    Asencion Partridge Tyjon Bowen MD  07/30/2016  CC; Dr Erlinda Hong CC: Marton Redwood, [address] [place], [place] [address]

## 2016-08-05 ENCOUNTER — Encounter: Payer: Self-pay | Admitting: Physical Medicine & Rehabilitation

## 2016-08-05 ENCOUNTER — Ambulatory Visit (HOSPITAL_BASED_OUTPATIENT_CLINIC_OR_DEPARTMENT_OTHER): Payer: Medicare Other | Admitting: Physical Medicine & Rehabilitation

## 2016-08-05 VITALS — BP 136/77 | HR 80 | Resp 14

## 2016-08-05 DIAGNOSIS — I69354 Hemiplegia and hemiparesis following cerebral infarction affecting left non-dominant side: Secondary | ICD-10-CM | POA: Diagnosis not present

## 2016-08-05 DIAGNOSIS — G8114 Spastic hemiplegia affecting left nondominant side: Secondary | ICD-10-CM

## 2016-08-05 DIAGNOSIS — G8110 Spastic hemiplegia affecting unspecified side: Secondary | ICD-10-CM

## 2016-08-05 NOTE — Patient Instructions (Signed)

## 2016-08-05 NOTE — Progress Notes (Signed)
Botox Injection for spasticity using needle EMG guidance  Dilution: 50 Units/ml Indication: Severe spasticity which interferes with ADL,mobility and/or  hygiene and is unresponsive to medication management and other conservative care Informed consent was obtained after describing risks and benefits of the procedure with the patient. This includes bleeding, bruising, infection, excessive weakness, or medication side effects. A REMS form is on file and signed. Needle: 28mm 25g needle electrode Number of units per muscle LEFT NON Dominant Biceps50 FCR25 FCU25 FDS75 FDP25  All injections were done after obtaining appropriate EMG activity and after negative drawback for blood. The patient tolerated the procedure well. Post procedure instructions were given. A followup appointment was made.

## 2016-08-11 ENCOUNTER — Ambulatory Visit (INDEPENDENT_AMBULATORY_CARE_PROVIDER_SITE_OTHER): Payer: Medicare Other | Admitting: *Deleted

## 2016-08-11 DIAGNOSIS — I639 Cerebral infarction, unspecified: Secondary | ICD-10-CM

## 2016-08-11 NOTE — Progress Notes (Signed)
[facility] Summary Report / Loop Recorder 

## 2016-08-16 LAB — CUP PACEART REMOTE DEVICE CHECK
Date Time Interrogation Session: 20171102144254
Implantable Pulse Generator Implant Date: 20170705

## 2016-08-16 NOTE — Progress Notes (Signed)
Carelink summary report received. Battery status OK. Normal device function. No new symptom episodes, tachy episodes, brady, or pause episodes. No new AF episodes. Monthly summary reports and ROV/PRN 

## 2016-08-25 ENCOUNTER — Ambulatory Visit (INDEPENDENT_AMBULATORY_CARE_PROVIDER_SITE_OTHER): Payer: Medicare Other | Admitting: Ophthalmology

## 2016-08-27 ENCOUNTER — Ambulatory Visit (INDEPENDENT_AMBULATORY_CARE_PROVIDER_SITE_OTHER): Payer: Medicare Other | Admitting: Ophthalmology

## 2016-08-27 DIAGNOSIS — H2513 Age-related nuclear cataract, bilateral: Secondary | ICD-10-CM

## 2016-08-27 DIAGNOSIS — E11319 Type 2 diabetes mellitus with unspecified diabetic retinopathy without macular edema: Secondary | ICD-10-CM

## 2016-08-27 DIAGNOSIS — H35033 Hypertensive retinopathy, bilateral: Secondary | ICD-10-CM

## 2016-08-27 DIAGNOSIS — E113392 Type 2 diabetes mellitus with moderate nonproliferative diabetic retinopathy without macular edema, left eye: Secondary | ICD-10-CM | POA: Diagnosis not present

## 2016-08-27 DIAGNOSIS — I10 Essential (primary) hypertension: Secondary | ICD-10-CM | POA: Diagnosis not present

## 2016-08-27 DIAGNOSIS — H43813 Vitreous degeneration, bilateral: Secondary | ICD-10-CM | POA: Diagnosis not present

## 2016-08-27 DIAGNOSIS — E113291 Type 2 diabetes mellitus with mild nonproliferative diabetic retinopathy without macular edema, right eye: Secondary | ICD-10-CM

## 2016-09-09 ENCOUNTER — Ambulatory Visit (INDEPENDENT_AMBULATORY_CARE_PROVIDER_SITE_OTHER): Payer: Medicare Other | Admitting: *Deleted

## 2016-09-09 DIAGNOSIS — I639 Cerebral infarction, unspecified: Secondary | ICD-10-CM | POA: Diagnosis not present

## 2016-09-10 NOTE — Progress Notes (Signed)
[facility] Summary Report 

## 2016-09-15 ENCOUNTER — Ambulatory Visit: Payer: Medicare Other | Admitting: Physical Medicine & Rehabilitation

## 2016-09-16 ENCOUNTER — Ambulatory Visit (INDEPENDENT_AMBULATORY_CARE_PROVIDER_SITE_OTHER): Payer: Medicare Other | Admitting: Internal Medicine

## 2016-09-16 ENCOUNTER — Ambulatory Visit: Payer: Medicare Other

## 2016-09-16 ENCOUNTER — Encounter: Payer: Self-pay | Admitting: Internal Medicine

## 2016-09-16 ENCOUNTER — Ambulatory Visit: Payer: Medicare Other | Admitting: Physical Medicine & Rehabilitation

## 2016-09-16 VITALS — BP 122/82 | HR 73 | Wt 197.0 lb

## 2016-09-16 DIAGNOSIS — I639 Cerebral infarction, unspecified: Secondary | ICD-10-CM | POA: Diagnosis not present

## 2016-09-16 DIAGNOSIS — E109 Type 1 diabetes mellitus without complications: Secondary | ICD-10-CM

## 2016-09-16 DIAGNOSIS — E139 Other specified diabetes mellitus without complications: Secondary | ICD-10-CM

## 2016-09-16 LAB — POCT GLYCOSYLATED HEMOGLOBIN (HGB A1C): HEMOGLOBIN A1C: 7.2

## 2016-09-16 NOTE — Progress Notes (Signed)
Patient ID: Danielle Vargas, female   DOB: 1951-08-13, 66 y.o.   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 Last visit 1.5 mo ago.  She has [name].  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 She is still in wheelchair, but can walk slowly with cane. Still in [facility].  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  Previous regimen: - Metformin 1000 mg bid  - U500 0.11-0.11-0.10 units before meals >> 0.09 mL tid  She has been on Glipizide in the past, too. She tried Invokana in the past >> severe diarrhea  She is on: - Lantus 30 >> 40 units in am and 20 at bedtime - Novolog:  Before b'fast: 20 >> 30 units  Before lunch: 25 >> 35 units  Before dinner: 20 >> 30 units If sugars before a meal 70-90, then only give Novolog 10 units before that meal. If sugars before a meal <70, then skip Novolog before that meal.  Pt checks her sugars 4x a day: - am:154-246, 257 >> 165-210 >> 161-240, 351 >> 99, 128-172, 178, 198 >> 180-303 >> ? - 2h after b'fast: 134-246 >> 152-266 >> n/c >> 114-207 >> 115-205, 239 >> n/c - before lunch:119, 167-318 >> 108-256 >> 141, 174-206, 229, 326 >> 148-221 >> 216-328 >> 101, 140s - 2h after lunch:90-308 >> n/c >> 83, 126-199 >> 57x1, 99-247 >> n/c - before dinner: 152-266, 315 >> 134-231, 252 >> 101-238 >> 77, 110-246, 272 >> 190-292 >> ? - 2h after dinner: 128-243 >> 184-235 >> n/c >> 111-214 >> 109-273, 339 >> n/c -  bedtime:147-223 >> 133-300 >> 191-260, 289 >> 140-216, 252 >> 126-378 >> n/c >> 180-295 >> 130-140s - nighttime: 161-310 >> n/c >> 75-232 >> 136-291 >> n/c few lows - lowest 77 >> 100; she has hypoglycemia awareness at 95.   Highest sugar was 286 >> 180.  Pt's meals are: - Breakfast: 2 slices of bread + PB - Lunch: sandwich;  salad + cheese + croutons;  baked potato >> pinto beans + cornbread - Dinner: leftovers or corn bread + pinto beans + occas. Cole slaw >> salad + lite dressing + saltines Not soft drinks anymore. Snacks at night. Cut out bread and desserts after the Holidays.  - has mild CKD, last BUN/creatinine:  Lab Results  Component Value Date   BUN 19 03/17/2016   CREATININE 0.94 03/17/2016  She is back on Losartan. - pt has HL:  02/2015: TG 252 (PCP)  Lab Results  Component Value Date   CHOL 138 03/11/2016   HDL 29 (L) 03/11/2016   LDLCALC 63 03/11/2016   TRIG 232 (H) 03/11/2016   CHOLHDL 4.8 03/11/2016  She is on Fenofibrate. - last eye exam was in 02/20/2016 - Dr Zigmund Daniel. R>L DR. No changes.   - + numbness and tingling in her feet. On neurontin.  She also has a history of HTN, HL, fatty liver, GERD, morbid obesity, hypothyroidism, migraines, chronic back pain, history of nephrolithiasis, osteoporosis, diverticulosis. She  was recently dx'ed with OSA >> on CPAP.  Since had pancreatitis (03/21/2015).  She has been admitted for CP 11/13/2015 >> r/o for AMI. She saw Dr Sallyanne Kuster in the hospital, her cardiologist is Dr. Tamala Julian.  I reviewed pt's medications, allergies, PMH, social hx, family hx, and changes were documented in the history of present illness. Otherwise, unchanged from my initial visit note.  ROS: Constitutional: + weight loss, no fatigue, no subjective hyperthermia/hypothermia Eyes: no blurry vision, no xerophthalmia ENT: no sore throat, no nodules palpated in throat, no dysphagia/odynophagia, no hoarseness Cardiovascular: no CP/SOB/palpitations/leg  swelling Respiratory: no cough/SOB/wheezing Gastrointestinal: no N/V/D/C Musculoskeletal: no muscle/joint aches Skin: no rashes Neurological: no tremors/+ numbness/no tingling/dizziness - paresis in L arm but can move L fingers  PE: BP 122/82 (BP Location: Right Arm, Patient Position: Sitting)   Pulse 73   Wt 197 lb (89.4 kg)   SpO2 96%   BMI 37.22 kg/m  Body mass index is 37.22 kg/m. Wt Readings from Last 3 Encounters:  09/16/16 197 lb (89.4 kg)  07/24/16 203 lb (92.1 kg)  07/10/16 206 lb 1.6 oz (93.5 kg)   Constitutional: overweight, in NAD, in wheelchair Eyes: PERRLA, EOMI, no exophthalmos ENT: moist mucous membranes, no thyromegaly, no cervical lymphadenopathy Cardiovascular: RRR, No MRG Respiratory: CTA B Gastrointestinal: abdomen soft, NT, ND, BS+ Musculoskeletal: + L arm and leg edematous, L hemiparesis Skin: moist, warm, no rashes  ASSESSMENT: 1. DM2, insulin-dependent, uncontrolled, with complications - CAD - AMI 10/26/2013 - had angioplasty, no stent placed. Had 2DEcho on 01/23/2014 EF increased (from 35) to 55%. - peripheral neuropathy, on Neurontin - Gastroparesis, on Reglan - Dr. 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  PLAN:  1. Patient with long-standing uncontrolled diabetes. At last visit, we increased Lantus and increased NovoLog - Last HbA1c was better, 6.2%, but she had high sugars after that 2/2 insulin regimen changes in the hospital. Since last visit, we have been working together to adjust her insulin doses >> sugars now much better, but no log from SNF.Marland Kitchen. - HbA1c now 7.2%.  -  I suggested:  Patient Instructions  Please continue: - Lantus 40 units in am and 20 at bedtime - Novolog:  Before b'fast: 30 units  Before lunch: 35 units  Before dinner: 30 units If sugars before a meal 70-90, then only give Novolog 10 units before that meal. If sugars before a meal <70, then skip Novolog before that  meal.  Please return in 3 months with your sugar log.    - continue checking sugars 4x a day - UTD with eye exam - UTD with flu shot - Return to clinic in 3 mo with sugar log   Danielle Kingdom, MD PhD [facility] Endocrinology

## 2016-09-16 NOTE — Patient Instructions (Addendum)
Please continue: - Lantus 40 units in am and 20 at bedtime - Novolog:  Before b'fast: 30 units  Before lunch: 35 units  Before dinner: 30 units If sugars before a meal 70-90, then only give Novolog 10 units before that meal. If sugars before a meal <70, then skip Novolog before that meal.  Please return in 3 months with your sugar log.

## 2016-09-16 NOTE — Addendum Note (Signed)
Addended by: Caprice Beaver T on: 09/16/2016 10:54 AM   Modules accepted: Orders

## 2016-09-18 LAB — CUP PACEART REMOTE DEVICE CHECK
Date Time Interrogation Session: 20171202150739
MDC IDC PG IMPLANT DT: 20170705

## 2016-09-25 ENCOUNTER — Encounter: Payer: Medicare Other | Attending: Physical Medicine & Rehabilitation

## 2016-09-25 ENCOUNTER — Ambulatory Visit: Payer: Medicare Other | Admitting: Physical Medicine & Rehabilitation

## 2016-09-25 DIAGNOSIS — E669 Obesity, unspecified: Secondary | ICD-10-CM | POA: Insufficient documentation

## 2016-09-25 DIAGNOSIS — E119 Type 2 diabetes mellitus without complications: Secondary | ICD-10-CM | POA: Insufficient documentation

## 2016-09-25 DIAGNOSIS — E785 Hyperlipidemia, unspecified: Secondary | ICD-10-CM | POA: Insufficient documentation

## 2016-09-25 DIAGNOSIS — G8929 Other chronic pain: Secondary | ICD-10-CM | POA: Insufficient documentation

## 2016-09-25 DIAGNOSIS — I10 Essential (primary) hypertension: Secondary | ICD-10-CM | POA: Insufficient documentation

## 2016-09-25 DIAGNOSIS — I69354 Hemiplegia and hemiparesis following cerebral infarction affecting left non-dominant side: Secondary | ICD-10-CM | POA: Insufficient documentation

## 2016-09-25 DIAGNOSIS — Z5189 Encounter for other specified aftercare: Secondary | ICD-10-CM | POA: Insufficient documentation

## 2016-10-08 ENCOUNTER — Ambulatory Visit (INDEPENDENT_AMBULATORY_CARE_PROVIDER_SITE_OTHER): Payer: Medicare Other | Admitting: *Deleted

## 2016-10-08 DIAGNOSIS — I639 Cerebral infarction, unspecified: Secondary | ICD-10-CM

## 2016-10-08 NOTE — Progress Notes (Signed)
[facility] Summary Report / Loop Recorder 

## 2016-10-09 ENCOUNTER — Encounter: Payer: Self-pay | Admitting: Physical Medicine & Rehabilitation

## 2016-10-09 ENCOUNTER — Ambulatory Visit (HOSPITAL_BASED_OUTPATIENT_CLINIC_OR_DEPARTMENT_OTHER): Payer: Medicare Other | Admitting: Physical Medicine & Rehabilitation

## 2016-10-09 ENCOUNTER — Encounter: Payer: Medicare Other | Attending: Physical Medicine & Rehabilitation

## 2016-10-09 VITALS — BP 126/81 | HR 70 | Resp 16

## 2016-10-09 DIAGNOSIS — E785 Hyperlipidemia, unspecified: Secondary | ICD-10-CM | POA: Diagnosis not present

## 2016-10-09 DIAGNOSIS — E119 Type 2 diabetes mellitus without complications: Secondary | ICD-10-CM | POA: Insufficient documentation

## 2016-10-09 DIAGNOSIS — E669 Obesity, unspecified: Secondary | ICD-10-CM | POA: Diagnosis not present

## 2016-10-09 DIAGNOSIS — G8929 Other chronic pain: Secondary | ICD-10-CM | POA: Diagnosis present

## 2016-10-09 DIAGNOSIS — I639 Cerebral infarction, unspecified: Secondary | ICD-10-CM

## 2016-10-09 DIAGNOSIS — I10 Essential (primary) hypertension: Secondary | ICD-10-CM | POA: Diagnosis not present

## 2016-10-09 DIAGNOSIS — I69354 Hemiplegia and hemiparesis following cerebral infarction affecting left non-dominant side: Secondary | ICD-10-CM | POA: Diagnosis present

## 2016-10-09 DIAGNOSIS — G8110 Spastic hemiplegia affecting unspecified side: Secondary | ICD-10-CM | POA: Diagnosis not present

## 2016-10-09 DIAGNOSIS — Z5189 Encounter for other specified aftercare: Secondary | ICD-10-CM | POA: Diagnosis present

## 2016-10-09 NOTE — Progress Notes (Signed)
Subjective:    Patient ID: Danielle Vargas, female    DOB: 1951/02/12, 66 y.o.   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 Left fingers feel straighter after botox done on 08/05/2016. LEFT NON Dominant Biceps50 FCR25 FCU25 FDS75 FDP25  Still receiving help with dressing and bathing Still getting therapy 3 days a week, massage e stim At [facility], no plans of returning to home.   Pain Inventory Average Pain 0 Pain Right Now 0 My pain is no pain  In the last 24 hours, has pain interfered with the following? General activity 0 Relation with others 0 Enjoyment of life 0 What TIME of day is your pain at its worst? no pain wiith medication schedule Sleep (in general) NA  Pain is worse with: some activites Pain improves with: medication Relief from Meds: no pain with medication schedule  Mobility walk with assistance use a wheelchair needs help with transfers  Function disabled: date disabled . I need assistance with the following:  dressing, bathing, toileting, meal prep, household duties and shopping  Neuro/Psych weakness  Prior Studies Any changes since last visit?  no  Physicians involved in your care Any changes since last visit?  no   Family History  Problem Relation Age of Onset  . COPD Father   . Stroke Father   . Emphysema Father   . Heart failure Mother   . Heart attack Mother     several  . Diabetes Mother   . Kidney disease Mother   . Heart failure Brother   . Hypertension Brother   . Colon cancer  45    mat. 1st cousin  . Other      celiac sprue, 1/2 brother  . Diabetes Brother     x 3  . Lung cancer Brother   . Cirrhosis Sister     liver transplant  . Liver disease Sister     transplant  . Heart attack Paternal Grandfather   . Heart attack Brother   . Colon cancer  Paternal Aunt 62  . Esophageal cancer Neg Hx   . Stomach cancer Neg Hx   . Rectal cancer Neg Hx    Social History   Social History  . Marital status: Divorced    Spouse name: N/A  . Number of children: 2  . Years of education: N/A   Occupational History  . disabled Unemployed   Social History Main Topics  . Smoking status: Never Smoker  . Smokeless tobacco: Never Used  . Alcohol use No  . Drug use: No  . Sexual activity: Not Currently    Birth control/ protection: Post-menopausal   Other Topics Concern  . Not on file   Social History Narrative   Lives in [facility] alone.  Retired.  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   Regular exercise: none   Caffeine use: daily; dt coke            Past Surgical History:  Procedure Laterality Date  . BREAST REDUCTION SURGERY  '88  . CARPAL TUNNEL RELEASE  2000   right wrist  . CERVICAL DISC SURGERY  '98   fusion  . CESAREAN SECTION  '79  '84  . COLONOSCOPY    . EP IMPLANTABLE DEVICE N/A 03/12/2016   Procedure: Loop Recorder Insertion;  Surgeon: Will Meredith Leeds, MD;  Location:  [facility] INVASIVE CV LAB;  Service: Cardiovascular;  Laterality: N/A;  . LEFT HEART CATHETERIZATION WITH CORONARY ANGIOGRAM N/A 10/26/2013   Procedure: LEFT HEART CATHETERIZATION WITH CORONARY ANGIOGRAM;  Surgeon: Sinclair Grooms, MD;  Location: [facility] CATH LAB;  Service: Cardiovascular;  Laterality: N/A;  . ROTATOR CUFF REPAIR  '09   left shoulder  . TEE WITHOUT CARDIOVERSION N/A 03/12/2016   Procedure: TRANSESOPHAGEAL ECHOCARDIOGRAM (TEE);  Surgeon: Sanda Klein, MD;  Location: [facility] ENDOSCOPY;  Service: Cardiovascular;  Laterality: N/A;  . TUBAL LIGATION  '85  . ULNAR NERVE REPAIR  '02, '08   left done, then right   Past Medical History:  Diagnosis Date  . Allergy   . Asthma   . CAD (coronary artery disease)    a. 12/2008 Cath: mild irregs throughout, EF 75%.  . Cataract    "just beginning"  . Colon polyps 7/07   hyperplastic and  adenomatous  . Depression    pt unsure, psychologist said no  . Diverticulosis   . DM (diabetes mellitus) ([place])   . Dyslipidemia   . External hemorrhoid   . Fatty liver   . Gastroparesis   . HTN (hypertension)   . Hyperlipidemia   . 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 Hypothyroidism   . Migraine   . Myocardial infarction    FEb 2015  . Nephrolithiasis   . Obesity   . Osteoporosis   . Sleep apnea    wears CPAP  . Stroke ([place])    BP 126/81   Pulse 70   Resp 16   SpO2 94%   Opioid Risk Score:   Fall Risk Score:  `1  Depression screen PHQ 2/9  Depression screen [facility] [address]/9 10/09/2016 07/15/2016 09/21/2015 09/18/2014 12/26/2013  Decreased Interest 0 0 0 1 0  Down, Depressed, Hopeless 0 0 0 1 1  PHQ - 2 Score 0 0 0 2 1   Review of Systems  Constitutional: Negative.   HENT: Negative.   Eyes: Negative.   Respiratory: Negative.   Cardiovascular: Negative.   Gastrointestinal: Negative.   Endocrine: Negative.   Genitourinary: Negative.   Musculoskeletal: Negative.   Skin: Negative.   Allergic/Immunologic: Negative.   Neurological: Negative.   Hematological: Negative.   Psychiatric/Behavioral: Negative.   All other systems reviewed and are negative.      Objective:   Physical Exam  Constitutional: She is oriented to person, place, and time. She appears well-developed and well-nourished.  HENT:  Head: Normocephalic and atraumatic.  Eyes: Conjunctivae are normal. Pupils are equal, round, and reactive to light.  Neurological: She is alert and oriented to person, place, and time.  Psychiatric: She has a normal mood and affect. Her behavior is normal. Judgment and thought content normal.  Nursing note and vitals reviewed. Motor strength is 2 minus at the left deltoid, biceps, trace triceps, trace finger flexion, 0 extension. Left lower limb, 3 minus. Hip flexion 4 minus knee extension to minus, ankle dorsiflexion  Ashworth grade 2 spasticity in the left finger flexors,  Ashworth grade 1 in the wrist flexors, Ashworth grade 1 in the elbow flexors       Assessment & Plan:   1. Left spastic hemiplegia, history of CVA, good response to Botox. Expect it to start wearing off in the next 4-6 weeks, schedule repeat Botox in 6 weeks

## 2016-10-09 NOTE — Patient Instructions (Signed)
In on repeating Botox in 6 weeks. Would add pronator muscle

## 2016-10-16 ENCOUNTER — Telehealth: Payer: Self-pay | Admitting: Cardiology

## 2016-10-16 NOTE — Telephone Encounter (Signed)
LMOVM requesting that pt send manual transmission b/c home monitor has not updated in at least 14 days.    

## 2016-10-20 LAB — CUP PACEART REMOTE DEVICE CHECK
Implantable Pulse Generator Implant Date: 20170705
MDC IDC SESS DTM: 20180101153607

## 2016-10-28 ENCOUNTER — Ambulatory Visit: Payer: Medicare Other | Admitting: Sports Medicine

## 2016-10-30 ENCOUNTER — Telehealth: Payer: Self-pay

## 2016-10-30 ENCOUNTER — Ambulatory Visit: Payer: Medicare Other | Admitting: Neurology

## 2016-10-30 NOTE — Telephone Encounter (Signed)
Patient did not show to appt today  

## 2016-11-03 ENCOUNTER — Encounter: Payer: Self-pay | Admitting: Neurology

## 2016-11-06 LAB — CUP PACEART REMOTE DEVICE CHECK
Implantable Pulse Generator Implant Date: 20170705
MDC IDC SESS DTM: 20180131154253

## 2016-11-07 ENCOUNTER — Encounter: Payer: Self-pay | Admitting: Physical Medicine & Rehabilitation

## 2016-11-07 ENCOUNTER — Ambulatory Visit (HOSPITAL_BASED_OUTPATIENT_CLINIC_OR_DEPARTMENT_OTHER): Payer: Medicare Other | Admitting: Physical Medicine & Rehabilitation

## 2016-11-07 ENCOUNTER — Encounter: Payer: Medicare Other | Attending: Physical Medicine & Rehabilitation

## 2016-11-07 ENCOUNTER — Ambulatory Visit (INDEPENDENT_AMBULATORY_CARE_PROVIDER_SITE_OTHER): Payer: Medicare Other | Admitting: *Deleted

## 2016-11-07 VITALS — BP 143/73 | HR 69

## 2016-11-07 DIAGNOSIS — I639 Cerebral infarction, unspecified: Secondary | ICD-10-CM

## 2016-11-07 DIAGNOSIS — I69354 Hemiplegia and hemiparesis following cerebral infarction affecting left non-dominant side: Secondary | ICD-10-CM | POA: Diagnosis present

## 2016-11-07 DIAGNOSIS — I10 Essential (primary) hypertension: Secondary | ICD-10-CM | POA: Diagnosis not present

## 2016-11-07 DIAGNOSIS — G8110 Spastic hemiplegia affecting unspecified side: Secondary | ICD-10-CM

## 2016-11-07 DIAGNOSIS — E669 Obesity, unspecified: Secondary | ICD-10-CM | POA: Insufficient documentation

## 2016-11-07 DIAGNOSIS — E785 Hyperlipidemia, unspecified: Secondary | ICD-10-CM | POA: Diagnosis not present

## 2016-11-07 DIAGNOSIS — G8114 Spastic hemiplegia affecting left nondominant side: Secondary | ICD-10-CM | POA: Diagnosis not present

## 2016-11-07 DIAGNOSIS — G8929 Other chronic pain: Secondary | ICD-10-CM | POA: Diagnosis present

## 2016-11-07 DIAGNOSIS — E119 Type 2 diabetes mellitus without complications: Secondary | ICD-10-CM | POA: Diagnosis not present

## 2016-11-07 DIAGNOSIS — Z5189 Encounter for other specified aftercare: Secondary | ICD-10-CM | POA: Diagnosis present

## 2016-11-07 NOTE — Progress Notes (Signed)
Botox Injection for spasticity using needle EMG guidance  Dilution: 50 Units/ml Indication: Severe spasticity which interferes with ADL,mobility and/or  hygiene and is unresponsive to medication management and other conservative care Informed consent was obtained after describing risks and benefits of the procedure with the patient. This includes bleeding, bruising, infection, excessive weakness, or medication side effects. A REMS form is on file and signed. Needle: 27g 1" needle electrode Number of units per muscle LEFT NON Dominant Biceps50 FCR25 FCU25 FDS75 FDP25 All injections were done after obtaining appropriate EMG activity and after negative drawback for blood. The patient tolerated the procedure well. Post procedure instructions were given. A followup appointment was made.  

## 2016-11-07 NOTE — Patient Instructions (Signed)

## 2016-11-10 ENCOUNTER — Ambulatory Visit: Payer: Medicare Other | Admitting: Neurology

## 2016-11-10 NOTE — Progress Notes (Signed)
[facility] Summary Report / Loop Recorder 

## 2016-11-11 ENCOUNTER — Encounter: Payer: Self-pay | Admitting: Neurology

## 2016-11-19 ENCOUNTER — Emergency Department (HOSPITAL_COMMUNITY): Payer: Medicare Other

## 2016-11-19 ENCOUNTER — Inpatient Hospital Stay (HOSPITAL_COMMUNITY)
Admission: EM | Admit: 2016-11-19 | Discharge: 2016-11-25 | DRG: 250 | Disposition: A | Discharge status: Skilled Nursing Facility | Payer: Medicare Other | Attending: Cardiovascular Disease | Admitting: Cardiovascular Disease

## 2016-11-19 ENCOUNTER — Encounter (HOSPITAL_COMMUNITY): Payer: Self-pay | Admitting: *Deleted

## 2016-11-19 ENCOUNTER — Encounter (HOSPITAL_COMMUNITY)
Admission: EM | Disposition: A | Discharge status: Skilled Nursing Facility | Payer: Self-pay | Source: Home / Self Care | Attending: Cardiovascular Disease

## 2016-11-19 DIAGNOSIS — Z801 Family history of malignant neoplasm of trachea, bronchus and lung: Secondary | ICD-10-CM

## 2016-11-19 DIAGNOSIS — Z794 Long term (current) use of insulin: Secondary | ICD-10-CM

## 2016-11-19 DIAGNOSIS — Z79899 Other long term (current) drug therapy: Secondary | ICD-10-CM | POA: Diagnosis not present

## 2016-11-19 DIAGNOSIS — Z825 Family history of asthma and other chronic lower respiratory diseases: Secondary | ICD-10-CM

## 2016-11-19 DIAGNOSIS — N179 Acute kidney failure, unspecified: Secondary | ICD-10-CM | POA: Diagnosis present

## 2016-11-19 DIAGNOSIS — Z800 Family history of malignant neoplasm of digestive organs: Secondary | ICD-10-CM

## 2016-11-19 DIAGNOSIS — R0602 Shortness of breath: Secondary | ICD-10-CM

## 2016-11-19 DIAGNOSIS — E119 Type 2 diabetes mellitus without complications: Secondary | ICD-10-CM | POA: Diagnosis present

## 2016-11-19 DIAGNOSIS — I429 Cardiomyopathy, unspecified: Secondary | ICD-10-CM | POA: Diagnosis present

## 2016-11-19 DIAGNOSIS — Z8249 Family history of ischemic heart disease and other diseases of the circulatory system: Secondary | ICD-10-CM

## 2016-11-19 DIAGNOSIS — Z833 Family history of diabetes mellitus: Secondary | ICD-10-CM

## 2016-11-19 DIAGNOSIS — I252 Old myocardial infarction: Secondary | ICD-10-CM

## 2016-11-19 DIAGNOSIS — Z955 Presence of coronary angioplasty implant and graft: Secondary | ICD-10-CM

## 2016-11-19 DIAGNOSIS — I509 Heart failure, unspecified: Secondary | ICD-10-CM | POA: Diagnosis not present

## 2016-11-19 DIAGNOSIS — E785 Hyperlipidemia, unspecified: Secondary | ICD-10-CM | POA: Diagnosis present

## 2016-11-19 DIAGNOSIS — I160 Hypertensive urgency: Secondary | ICD-10-CM | POA: Diagnosis present

## 2016-11-19 DIAGNOSIS — Z993 Dependence on wheelchair: Secondary | ICD-10-CM | POA: Diagnosis not present

## 2016-11-19 DIAGNOSIS — J9600 Acute respiratory failure, unspecified whether with hypoxia or hypercapnia: Secondary | ICD-10-CM | POA: Diagnosis present

## 2016-11-19 DIAGNOSIS — I2510 Atherosclerotic heart disease of native coronary artery without angina pectoris: Secondary | ICD-10-CM | POA: Diagnosis not present

## 2016-11-19 DIAGNOSIS — I2119 ST elevation (STEMI) myocardial infarction involving other coronary artery of inferior wall: Principal | ICD-10-CM

## 2016-11-19 DIAGNOSIS — Z823 Family history of stroke: Secondary | ICD-10-CM

## 2016-11-19 DIAGNOSIS — J810 Acute pulmonary edema: Secondary | ICD-10-CM | POA: Diagnosis not present

## 2016-11-19 DIAGNOSIS — I2121 ST elevation (STEMI) myocardial infarction involving left circumflex coronary artery: Secondary | ICD-10-CM

## 2016-11-19 DIAGNOSIS — Z7902 Long term (current) use of antithrombotics/antiplatelets: Secondary | ICD-10-CM

## 2016-11-19 DIAGNOSIS — I5041 Acute combined systolic (congestive) and diastolic (congestive) heart failure: Secondary | ICD-10-CM | POA: Diagnosis present

## 2016-11-19 DIAGNOSIS — E039 Hypothyroidism, unspecified: Secondary | ICD-10-CM | POA: Diagnosis present

## 2016-11-19 DIAGNOSIS — I69354 Hemiplegia and hemiparesis following cerebral infarction affecting left non-dominant side: Secondary | ICD-10-CM

## 2016-11-19 DIAGNOSIS — E669 Obesity, unspecified: Secondary | ICD-10-CM | POA: Diagnosis present

## 2016-11-19 DIAGNOSIS — I2129 ST elevation (STEMI) myocardial infarction involving other sites: Secondary | ICD-10-CM

## 2016-11-19 DIAGNOSIS — I5021 Acute systolic (congestive) heart failure: Secondary | ICD-10-CM | POA: Diagnosis not present

## 2016-11-19 DIAGNOSIS — Z6837 Body mass index (BMI) 37.0-37.9, adult: Secondary | ICD-10-CM | POA: Diagnosis not present

## 2016-11-19 DIAGNOSIS — I110 Hypertensive heart disease with heart failure: Secondary | ICD-10-CM | POA: Diagnosis present

## 2016-11-19 DIAGNOSIS — I5042 Chronic combined systolic (congestive) and diastolic (congestive) heart failure: Secondary | ICD-10-CM | POA: Diagnosis present

## 2016-11-19 HISTORY — DX: Acute combined systolic (congestive) and diastolic (congestive) heart failure: I50.41

## 2016-11-19 HISTORY — DX: ST elevation (STEMI) myocardial infarction involving other coronary artery of inferior wall: I21.19

## 2016-11-19 HISTORY — PX: CORONARY BALLOON ANGIOPLASTY: CATH118233

## 2016-11-19 HISTORY — PX: LEFT HEART CATH AND CORONARY ANGIOGRAPHY: CATH118249

## 2016-11-19 LAB — LIPID PANEL
Cholesterol: 116 mg/dL (ref 0–200)
HDL: 25 mg/dL — ABNORMAL LOW (ref >40)
LDL Cholesterol: 51 mg/dL (ref 0–99)
TRIGLYCERIDES: 200 mg/dL — AB (ref <150)
Total CHOL/HDL Ratio: 4.6 RATIO
VLDL: 40 mg/dL (ref 0–40)

## 2016-11-19 LAB — COMPREHENSIVE METABOLIC PANEL
ALBUMIN: 3.4 g/dL — AB (ref 3.5–5.0)
ALT: 24 U/L (ref 14–54)
ANION GAP: 15 (ref 5–15)
AST: 54 U/L — ABNORMAL HIGH (ref 15–41)
Alkaline Phosphatase: 51 U/L (ref 38–126)
BILIRUBIN TOTAL: 0.8 mg/dL (ref 0.3–1.2)
BUN: 22 mg/dL — ABNORMAL HIGH (ref 6–20)
CHLORIDE: 107 mmol/L (ref 101–111)
CO2: 17 mmol/L — ABNORMAL LOW (ref 22–32)
Calcium: 9.3 mg/dL (ref 8.9–10.3)
Creatinine, Ser: 1.17 mg/dL — ABNORMAL HIGH (ref 0.44–1.00)
GFR calc Af Amer: 56 mL/min — ABNORMAL LOW (ref >60)
GFR, EST NON AFRICAN AMERICAN: 48 mL/min — AB (ref >60)
GLUCOSE: 252 mg/dL — AB (ref 65–99)
POTASSIUM: 4.2 mmol/L (ref 3.5–5.1)
Sodium: 139 mmol/L (ref 135–145)
TOTAL PROTEIN: 7.5 g/dL (ref 6.5–8.1)

## 2016-11-19 LAB — TROPONIN I
TROPONIN I: 1.72 ng/mL — AB (ref <0.03)
TROPONIN I: 48.47 ng/mL — AB (ref <0.03)
TROPONIN I: 39.39 ng/mL — AB (ref <0.03)
Troponin I: 51.68 ng/mL (ref <0.03)

## 2016-11-19 LAB — CBC
HCT: 44.5 % (ref 36.0–46.0)
HEMATOCRIT: 44.3 % (ref 36.0–46.0)
HEMOGLOBIN: 14.5 g/dL (ref 12.0–15.0)
Hemoglobin: 15.1 g/dL — ABNORMAL HIGH (ref 12.0–15.0)
MCH: 31.2 pg (ref 26.0–34.0)
MCH: 30.1 pg (ref 26.0–34.0)
MCHC: 33.9 g/dL (ref 30.0–36.0)
MCHC: 32.7 g/dL (ref 30.0–36.0)
MCV: 91.9 fL (ref 78.0–100.0)
MCV: 91.9 fL (ref 78.0–100.0)
PLATELETS: 375 10*3/uL (ref 150–400)
Platelets: 350 10*3/uL (ref 150–400)
RBC: 4.84 MIL/uL (ref 3.87–5.11)
RBC: 4.82 MIL/uL (ref 3.87–5.11)
RDW: 14.3 % (ref 11.5–15.5)
RDW: 14.1 % (ref 11.5–15.5)
WBC: 17.9 10*3/uL — AB (ref 4.0–10.5)
WBC: 14.2 10*3/uL — ABNORMAL HIGH (ref 4.0–10.5)

## 2016-11-19 LAB — CREATININE, SERUM
Creatinine, Ser: 1.14 mg/dL — ABNORMAL HIGH (ref 0.44–1.00)
GFR calc Af Amer: 58 mL/min — ABNORMAL LOW (ref >60)
GFR calc non Af Amer: 50 mL/min — ABNORMAL LOW (ref >60)

## 2016-11-19 LAB — MRSA PCR SCREENING: MRSA by PCR: NEGATIVE

## 2016-11-19 LAB — PROTIME-INR
INR: 1.13
PROTHROMBIN TIME: 14.6 s (ref 11.4–15.2)

## 2016-11-19 LAB — DIFFERENTIAL
BASOS ABS: 0.1 10*3/uL (ref 0.0–0.1)
Basophils Relative: 0 %
EOS PCT: 2 %
Eosinophils Absolute: 0.3 10*3/uL (ref 0.0–0.7)
LYMPHS PCT: 18 %
Lymphs Abs: 3.2 10*3/uL (ref 0.7–4.0)
Monocytes Absolute: 0.8 10*3/uL (ref 0.1–1.0)
Monocytes Relative: 4 %
NEUTROS PCT: 76 %
Neutro Abs: 13.7 10*3/uL — ABNORMAL HIGH (ref 1.7–7.7)

## 2016-11-19 LAB — GLUCOSE, CAPILLARY
GLUCOSE-CAPILLARY: 215 mg/dL — AB (ref 65–99)
GLUCOSE-CAPILLARY: 180 mg/dL — AB (ref 65–99)
Glucose-Capillary: 237 mg/dL — ABNORMAL HIGH (ref 65–99)
Glucose-Capillary: 242 mg/dL — ABNORMAL HIGH (ref 65–99)

## 2016-11-19 LAB — POCT ACTIVATED CLOTTING TIME: Activated Clotting Time: 450 seconds

## 2016-11-19 LAB — HEMOGLOBIN A1C
HEMOGLOBIN A1C: 6.6 % — AB (ref 4.8–5.6)
Mean Plasma Glucose: 143 mg/dL

## 2016-11-19 LAB — APTT: APTT: 27 s (ref 24–36)

## 2016-11-19 LAB — BRAIN NATRIURETIC PEPTIDE: B NATRIURETIC PEPTIDE 5: 275 pg/mL — AB (ref 0.0–100.0)

## 2016-11-19 SURGERY — LEFT HEART CATH AND CORONARY ANGIOGRAPHY
Anesthesia: LOCAL

## 2016-11-19 MED ORDER — ROSUVASTATIN CALCIUM 10 MG PO TABS
20.0000 mg | ORAL_TABLET | Freq: Every day | ORAL | Status: DC
  Administered 2016-11-19 – 2016-11-24 (×6): 20 mg via ORAL
  Filled 2016-11-19: qty 2
  Filled 2016-11-19 (×3): qty 1
  Filled 2016-11-19: qty 2
  Filled 2016-11-19: qty 1

## 2016-11-19 MED ORDER — ALBUTEROL SULFATE (2.5 MG/3ML) 0.083% IN NEBU: 2.5000 mg | INHALATION_SOLUTION | Freq: Four times a day (QID) | RESPIRATORY_TRACT | Status: DC | PRN

## 2016-11-19 MED ORDER — FLUOXETINE HCL 10 MG PO CAPS
10.0000 mg | ORAL_CAPSULE | Freq: Every day | ORAL | Status: DC
  Administered 2016-11-19 – 2016-11-25 (×7): 10 mg via ORAL
  Filled 2016-11-19 (×7): qty 1

## 2016-11-19 MED ORDER — DOCUSATE SODIUM 100 MG PO CAPS
100.0000 mg | ORAL_CAPSULE | Freq: Two times a day (BID) | ORAL | Status: DC
  Administered 2016-11-19 – 2016-11-25 (×10): 100 mg via ORAL
  Filled 2016-11-19 (×11): qty 1

## 2016-11-19 MED ORDER — SODIUM CHLORIDE 0.9 % IV SOLN
10.0000 mL/h | INTRAVENOUS | Status: DC
  Administered 2016-11-19: 20 mL/h via INTRAVENOUS

## 2016-11-19 MED ORDER — TOPIRAMATE 25 MG PO TABS
50.0000 mg | ORAL_TABLET | Freq: Two times a day (BID) | ORAL | Status: DC
  Administered 2016-11-19 – 2016-11-25 (×13): 50 mg via ORAL
  Filled 2016-11-19 (×13): qty 2

## 2016-11-19 MED ORDER — FUROSEMIDE 10 MG/ML IJ SOLN
40.0000 mg | Freq: Two times a day (BID) | INTRAMUSCULAR | Status: DC
  Administered 2016-11-19 – 2016-11-22 (×5): 40 mg via INTRAVENOUS
  Filled 2016-11-19 (×4): qty 4

## 2016-11-19 MED ORDER — ORAL CARE MOUTH RINSE
15.0000 mL | Freq: Two times a day (BID) | OROMUCOSAL | Status: DC
  Administered 2016-11-20 – 2016-11-23 (×7): 15 mL via OROMUCOSAL

## 2016-11-19 MED ORDER — NITROGLYCERIN 1 MG/10 ML FOR IR/CATH LAB
INTRA_ARTERIAL | Status: DC | PRN
  Administered 2016-11-19: 200 ug via INTRACORONARY

## 2016-11-19 MED ORDER — IOPAMIDOL (ISOVUE-370) INJECTION 76%
INTRAVENOUS | Status: DC | PRN
  Administered 2016-11-19: 125 mL via INTRA_ARTERIAL

## 2016-11-19 MED ORDER — HYDROCODONE-ACETAMINOPHEN 10-325 MG PO TABS
1.0000 | ORAL_TABLET | ORAL | Status: DC | PRN
  Administered 2016-11-20 – 2016-11-25 (×7): 1 via ORAL
  Filled 2016-11-19 (×7): qty 1

## 2016-11-19 MED ORDER — NITROGLYCERIN IN D5W 200-5 MCG/ML-% IV SOLN: 0.0000 ug/min | INTRAVENOUS | Status: DC

## 2016-11-19 MED ORDER — LIDOCAINE HCL (PF) 1 % IJ SOLN
INTRAMUSCULAR | Status: DC | PRN
  Administered 2016-11-19: 20 mL

## 2016-11-19 MED ORDER — LIDOCAINE HCL (PF) 1 % IJ SOLN
INTRAMUSCULAR | Status: AC
  Filled 2016-11-19: qty 30

## 2016-11-19 MED ORDER — TICAGRELOR 90 MG PO TABS
90.0000 mg | ORAL_TABLET | Freq: Two times a day (BID) | ORAL | Status: DC
  Administered 2016-11-19 – 2016-11-25 (×12): 90 mg via ORAL
  Filled 2016-11-19 (×12): qty 1

## 2016-11-19 MED ORDER — HEPARIN SODIUM (PORCINE) 5000 UNIT/ML IJ SOLN
5000.0000 [IU] | Freq: Three times a day (TID) | INTRAMUSCULAR | Status: DC
  Administered 2016-11-19 – 2016-11-25 (×17): 5000 [IU] via SUBCUTANEOUS
  Filled 2016-11-19 (×17): qty 1

## 2016-11-19 MED ORDER — SODIUM CHLORIDE 0.9% FLUSH
3.0000 mL | Freq: Two times a day (BID) | INTRAVENOUS | Status: DC
  Administered 2016-11-19 – 2016-11-20 (×3): 3 mL via INTRAVENOUS
  Administered 2016-11-20: 10 mL via INTRAVENOUS
  Administered 2016-11-21 – 2016-11-24 (×7): 3 mL via INTRAVENOUS

## 2016-11-19 MED ORDER — SODIUM CHLORIDE 0.9 % IV SOLN: 250.0000 mL | INTRAVENOUS | Status: DC | PRN

## 2016-11-19 MED ORDER — MIDAZOLAM HCL 2 MG/2ML IJ SOLN
INTRAMUSCULAR | Status: DC | PRN
  Administered 2016-11-19: 1 mg via INTRAVENOUS

## 2016-11-19 MED ORDER — IOPAMIDOL (ISOVUE-370) INJECTION 76%
INTRAVENOUS | Status: AC
  Filled 2016-11-19: qty 125

## 2016-11-19 MED ORDER — NITROGLYCERIN 1 MG/10 ML FOR IR/CATH LAB
INTRA_ARTERIAL | Status: AC
  Filled 2016-11-19: qty 10

## 2016-11-19 MED ORDER — HEPARIN SODIUM (PORCINE) 5000 UNIT/ML IJ SOLN
4000.0000 [IU] | INTRAMUSCULAR | Status: AC
  Administered 2016-11-19: 4000 [IU] via INTRAVENOUS

## 2016-11-19 MED ORDER — SODIUM CHLORIDE 0.9 % IV SOLN
INTRAVENOUS | Status: DC | PRN
  Administered 2016-11-19: 1.75 mg/kg/h via INTRAVENOUS

## 2016-11-19 MED ORDER — BIVALIRUDIN BOLUS VIA INFUSION - CUPID
INTRAVENOUS | Status: DC | PRN
  Administered 2016-11-19: 67.05 mg via INTRAVENOUS

## 2016-11-19 MED ORDER — TICAGRELOR 90 MG PO TABS
ORAL_TABLET | ORAL | Status: DC | PRN
  Administered 2016-11-19: 180 mg via ORAL

## 2016-11-19 MED ORDER — HEPARIN (PORCINE) IN NACL 2-0.9 UNIT/ML-% IJ SOLN
INTRAMUSCULAR | Status: DC | PRN
  Administered 2016-11-19: 1000 mL

## 2016-11-19 MED ORDER — ALBUTEROL SULFATE HFA 108 (90 BASE) MCG/ACT IN AERS: 2.0000 | INHALATION_SPRAY | Freq: Four times a day (QID) | RESPIRATORY_TRACT | Status: DC | PRN

## 2016-11-19 MED ORDER — MOMETASONE FURO-FORMOTEROL FUM 200-5 MCG/ACT IN AERO
2.0000 | INHALATION_SPRAY | Freq: Two times a day (BID) | RESPIRATORY_TRACT | Status: DC
  Administered 2016-11-19 – 2016-11-25 (×10): 2 via RESPIRATORY_TRACT
  Filled 2016-11-19 (×3): qty 8.8

## 2016-11-19 MED ORDER — GABAPENTIN 300 MG PO CAPS
300.0000 mg | ORAL_CAPSULE | Freq: Three times a day (TID) | ORAL | Status: DC
  Administered 2016-11-19 – 2016-11-25 (×19): 300 mg via ORAL
  Filled 2016-11-19 (×19): qty 1

## 2016-11-19 MED ORDER — FENTANYL CITRATE (PF) 100 MCG/2ML IJ SOLN
INTRAMUSCULAR | Status: DC | PRN
  Administered 2016-11-19: 25 ug via INTRAVENOUS

## 2016-11-19 MED ORDER — FENTANYL CITRATE (PF) 100 MCG/2ML IJ SOLN
INTRAMUSCULAR | Status: AC
  Filled 2016-11-19: qty 2

## 2016-11-19 MED ORDER — HYDRALAZINE HCL 20 MG/ML IJ SOLN: 5.0000 mg | INTRAMUSCULAR | Status: AC | PRN

## 2016-11-19 MED ORDER — LABETALOL HCL 5 MG/ML IV SOLN: 10.0000 mg | INTRAVENOUS | Status: AC | PRN

## 2016-11-19 MED ORDER — SODIUM CHLORIDE 0.9% FLUSH: 3.0000 mL | INTRAVENOUS | Status: DC | PRN

## 2016-11-19 MED ORDER — MIDAZOLAM HCL 2 MG/2ML IJ SOLN
INTRAMUSCULAR | Status: AC
  Filled 2016-11-19: qty 2

## 2016-11-19 MED ORDER — INSULIN GLARGINE 100 UNIT/ML ~~LOC~~ SOLN
20.0000 [IU] | Freq: Every day | SUBCUTANEOUS | Status: DC
  Administered 2016-11-19 – 2016-11-20 (×2): 20 [IU] via SUBCUTANEOUS
  Filled 2016-11-19 (×2): qty 0.2

## 2016-11-19 MED ORDER — PANTOPRAZOLE SODIUM 40 MG PO TBEC
40.0000 mg | DELAYED_RELEASE_TABLET | Freq: Every day | ORAL | Status: DC
  Administered 2016-11-19 – 2016-11-25 (×7): 40 mg via ORAL
  Filled 2016-11-19 (×8): qty 1

## 2016-11-19 MED ORDER — INSULIN ASPART 100 UNIT/ML ~~LOC~~ SOLN
0.0000 [IU] | Freq: Three times a day (TID) | SUBCUTANEOUS | Status: DC
  Administered 2016-11-19 – 2016-11-20 (×3): 5 [IU] via SUBCUTANEOUS
  Administered 2016-11-20: 8 [IU] via SUBCUTANEOUS
  Administered 2016-11-20: 11 [IU] via SUBCUTANEOUS
  Administered 2016-11-21: 3 [IU] via SUBCUTANEOUS
  Administered 2016-11-21: 8 [IU] via SUBCUTANEOUS
  Administered 2016-11-21: 11 [IU] via SUBCUTANEOUS
  Administered 2016-11-22 (×3): 5 [IU] via SUBCUTANEOUS
  Administered 2016-11-23: 3 [IU] via SUBCUTANEOUS
  Administered 2016-11-23: 5 [IU] via SUBCUTANEOUS
  Administered 2016-11-23: 11 [IU] via SUBCUTANEOUS
  Administered 2016-11-24 (×2): 8 [IU] via SUBCUTANEOUS
  Administered 2016-11-24: 3 [IU] via SUBCUTANEOUS
  Administered 2016-11-25: 8 [IU] via SUBCUTANEOUS
  Administered 2016-11-25: 5 [IU] via SUBCUTANEOUS

## 2016-11-19 MED ORDER — METOCLOPRAMIDE HCL 10 MG PO TABS
10.0000 mg | ORAL_TABLET | Freq: Four times a day (QID) | ORAL | Status: DC
  Administered 2016-11-19 – 2016-11-25 (×25): 10 mg via ORAL
  Filled 2016-11-19 (×25): qty 1

## 2016-11-19 MED ORDER — ONDANSETRON HCL 4 MG/2ML IJ SOLN: 4.0000 mg | Freq: Four times a day (QID) | INTRAMUSCULAR | Status: DC | PRN

## 2016-11-19 MED ORDER — BIVALIRUDIN 250 MG IV SOLR
INTRAVENOUS | Status: AC
  Filled 2016-11-19: qty 250

## 2016-11-19 MED ORDER — FUROSEMIDE 10 MG/ML IJ SOLN
80.0000 mg | Freq: Once | INTRAMUSCULAR | Status: AC
  Administered 2016-11-19: 80 mg via INTRAVENOUS
  Filled 2016-11-19: qty 8

## 2016-11-19 MED ORDER — CARVEDILOL 3.125 MG PO TABS
3.1250 mg | ORAL_TABLET | Freq: Two times a day (BID) | ORAL | Status: DC
  Administered 2016-11-19 – 2016-11-25 (×9): 3.125 mg via ORAL
  Filled 2016-11-19 (×12): qty 1

## 2016-11-19 MED ORDER — FLUTICASONE PROPIONATE 50 MCG/ACT NA SUSP
2.0000 | Freq: Every day | NASAL | Status: DC
  Administered 2016-11-19 – 2016-11-23 (×3): 2 via NASAL
  Filled 2016-11-19: qty 16

## 2016-11-19 MED ORDER — LEVOTHYROXINE SODIUM 100 MCG PO TABS
100.0000 ug | ORAL_TABLET | Freq: Every day | ORAL | Status: DC
  Administered 2016-11-20 – 2016-11-25 (×6): 100 ug via ORAL
  Filled 2016-11-19 (×6): qty 1

## 2016-11-19 MED ORDER — NITROGLYCERIN 0.4 MG SL SUBL
0.4000 mg | SUBLINGUAL_TABLET | SUBLINGUAL | Status: DC | PRN
  Administered 2016-11-21: 0.4 mg via SUBLINGUAL
  Filled 2016-11-19: qty 1

## 2016-11-19 MED ORDER — TICAGRELOR 90 MG PO TABS
ORAL_TABLET | ORAL | Status: AC
  Filled 2016-11-19: qty 2

## 2016-11-19 MED ORDER — TIZANIDINE HCL 2 MG PO TABS
2.0000 mg | ORAL_TABLET | Freq: Three times a day (TID) | ORAL | Status: DC
  Administered 2016-11-19 – 2016-11-25 (×19): 2 mg via ORAL
  Filled 2016-11-19 (×20): qty 1

## 2016-11-19 MED ORDER — ASPIRIN EC 81 MG PO TBEC
81.0000 mg | DELAYED_RELEASE_TABLET | Freq: Every day | ORAL | Status: DC
  Administered 2016-11-20 – 2016-11-25 (×6): 81 mg via ORAL
  Filled 2016-11-19 (×6): qty 1

## 2016-11-19 MED ORDER — MONTELUKAST SODIUM 10 MG PO TABS
10.0000 mg | ORAL_TABLET | Freq: Every day | ORAL | Status: DC
  Administered 2016-11-19 – 2016-11-24 (×6): 10 mg via ORAL
  Filled 2016-11-19 (×6): qty 1

## 2016-11-19 MED ORDER — FENOFIBRATE 160 MG PO TABS
160.0000 mg | ORAL_TABLET | Freq: Every day | ORAL | Status: DC
  Administered 2016-11-19 – 2016-11-24 (×6): 160 mg via ORAL
  Filled 2016-11-19 (×6): qty 1

## 2016-11-19 MED ORDER — NITROGLYCERIN IN D5W 200-5 MCG/ML-% IV SOLN
INTRAVENOUS | Status: AC
Start: 2016-11-19 — End: 2016-11-19
  Filled 2016-11-19: qty 250

## 2016-11-19 MED ORDER — POLYETHYLENE GLYCOL 3350 17 G PO PACK
17.0000 g | PACK | ORAL | Status: DC
  Administered 2016-11-20: 17 g via ORAL
  Filled 2016-11-19 (×4): qty 1

## 2016-11-19 MED ORDER — NITROGLYCERIN IN D5W 200-5 MCG/ML-% IV SOLN
INTRAVENOUS | Status: DC | PRN
  Administered 2016-11-19: 10 ug/min via INTRAVENOUS

## 2016-11-19 MED ORDER — HEPARIN (PORCINE) IN NACL 2-0.9 UNIT/ML-% IJ SOLN
INTRAMUSCULAR | Status: AC
  Filled 2016-11-19: qty 1000

## 2016-11-19 MED ORDER — ACETAMINOPHEN 325 MG PO TABS
650.0000 mg | ORAL_TABLET | ORAL | Status: DC | PRN
  Administered 2016-11-20 – 2016-11-24 (×2): 650 mg via ORAL
  Filled 2016-11-19 (×2): qty 2

## 2016-11-19 SURGICAL SUPPLY — 23 items
BALLN EUPHORA RX 2.5X12 (BALLOONS) ×2
BALLOON EUPHORA RX 2.5X12 (BALLOONS) IMPLANT
CATH INFINITI 5 FR 3DRC (CATHETERS) ×1 IMPLANT
CATH INFINITI 5FR MULTPACK ANG (CATHETERS) ×1 IMPLANT
CATH VISTA GUIDE 6FR XB3.5 (CATHETERS) ×1 IMPLANT
COVER PRB 48X5XTLSCP FOLD TPE (BAG) IMPLANT
COVER PROBE 5X48 (BAG) ×2
DEVICE CLOSURE PERCLS PRGLD 6F (VASCULAR PRODUCTS) IMPLANT
FEM STOP ARCH (HEMOSTASIS) ×1
GLIDESHEATH SLEND SS 6F .021 (SHEATH) ×1 IMPLANT
GUIDEWIRE INQWIRE 1.5J.035X260 (WIRE) IMPLANT
HOVERMATT SINGLE USE (MISCELLANEOUS) ×1 IMPLANT
INQWIRE 1.5J .035X260CM (WIRE) ×2
KIT HEART LEFT (KITS) ×2 IMPLANT
PACK CARDIAC CATHETERIZATION (CUSTOM PROCEDURE TRAY) ×2 IMPLANT
PERCLOSE PROGLIDE 6F (VASCULAR PRODUCTS) ×2
SHEATH PINNACLE 6F 10CM (SHEATH) ×1 IMPLANT
SYR MEDRAD MARK V 150ML (SYRINGE) ×2 IMPLANT
SYSTEM COMPRESSION FEMOSTOP (HEMOSTASIS) IMPLANT
TRANSDUCER W/STOPCOCK (MISCELLANEOUS) ×2 IMPLANT
TUBING CIL FLEX 10 FLL-RA (TUBING) ×2 IMPLANT
WIRE COUGAR XT STRL 190CM (WIRE) ×1 IMPLANT
WIRE EMERALD 3MM-J .035X150CM (WIRE) ×1 IMPLANT

## 2016-11-19 NOTE — Progress Notes (Signed)
Patient taken off of BiPAP at this time and placed on a 4 Lpm nasal cannula. Patient only off a couple of minutes and starting to work harder. When asked, patient stated she would like to be put back on BiPAP at this time due to it being easier for her to breathe while on it. RN aware.

## 2016-11-19 NOTE — Progress Notes (Signed)
1125 tried to take pt off the BIPAP. The Pt had trouble breathing and said that she was short of breath. Rt placed Pt back on BIPAP and lowered her settings to 10/5 and 40% Pt is stable and is resting better. RT will continue to monitor

## 2016-11-19 NOTE — ED Provider Notes (Signed)
[facility] DEPT Provider Note   CSN: [id number] Arrival date & time: 11/19/16  4193     History   Chief Complaint Chief Complaint  Patient presents with  . Shortness of Breath  . Chest Pain    HPI Danielle Vargas is a 66 y.o. female.  HPI  This is a 66 year old female with a history of coronary artery disease, CVA, hypertension, hyperlipidemia who presents with shortness of breath. Patient had about shortness of breath. Upon EMS arrival she reported shortness of breath. She is notably diaphoretic. Shortness of breath was acute in onset. EMS transmitted an EKG that was wide-complex tachycardia concerning for ACS. Patient did have an episode of sternal chest pain in route. This resolved with nitroglycerin. She was given full dose aspirin.  She denies any chest pain at this time. She does report persistent shortness of breath. No recent fevers or cough.  Level V caveat for acuity of condition  Past Medical History:  Diagnosis Date  . Allergy   . Asthma   . CAD (coronary artery disease)    a. 12/2008 Cath: mild irregs throughout, EF 75%.  . Cataract    "just beginning"  . Colon polyps 7/07   hyperplastic and adenomatous  . Depression    pt unsure, psychologist said no  . Diverticulosis   . DM (diabetes mellitus) ([place])   . Dyslipidemia   . External hemorrhoid   . Fatty liver   . Gastroparesis   . HTN (hypertension)   . Hyperlipidemia   . 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 Hypothyroidism   . Migraine   . Myocardial infarction    FEb 2015  . Nephrolithiasis   . Obesity   . Osteoporosis   . Sleep apnea    wears CPAP  . Stroke [facility])     Patient Active Problem List   Diagnosis Date Noted  . Spastic hemiplegia affecting nondominant side ([place]) 08/05/2016  . OSA (obstructive sleep apnea) 07/30/2016  . 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 Left spastic hemiparesis ([place]) 07/15/2016  . Bursitis of right hip 04/01/2016  . DM type 2 with diabetic peripheral neuropathy ([place])   . Vascular headache   . Right middle cerebral artery stroke ([place]) 03/14/2016  . Asthma   . Acute lower UTI   . Benign essential HTN   . HLD (hyperlipidemia)   . S/P rotator cuff repair   . Tachypnea   . AKI (acute kidney injury) ([place])   . Acute right MCA stroke ([place])   . Back pain   . Ischemic stroke ([place]) 03/10/2016  . Stroke (cerebrum) ([place]) 03/10/2016  . Chest pain, rule out acute myocardial infarction 11/10/2015  . Chest pain 11/10/2015  . Obesity   . Hypothyroidism   . 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 UTI (urinary tract infection) 06/16/2015  . Diabetes mellitus ([place]) 06/16/2015  . RUQ abdominal pain 04/17/2015  . Hx of colonic polyps 04/17/2015  . Family hx of colon cancer 04/17/2015  . CPAP rhinitis 12/08/2014  . OSA on CPAP 12/08/2014  . 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 Retinopathy due to secondary diabetes mellitus, without macular edema, with moderate nonproliferative retinopathy ([place]) 09/18/2014  . Neuropathy due to secondary diabetes mellitus ([place]) 09/18/2014  . DM (diabetes mellitus) ([place]) 09/18/2014  . Obesity hypoventilation syndrome ([place]) 09/18/2014  . CAD in native  artery 01/23/2014  . Morbid obesity ([place]) 01/23/2014  . Renal insufficiency 11/17/2013  . HTN (hypertension) 10/29/2013  . Chronic diastolic heart failure ([place]) 10/29/2013  . 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 LADA (latent autoimmune diabetes in adults), managed as type 1 ([place]) 08/08/2013    Past Surgical History:  Procedure Laterality Date  . BREAST REDUCTION SURGERY  '88  . CARPAL TUNNEL RELEASE  2000   right wrist  . CERVICAL DISC SURGERY  '98   fusion  . CESAREAN SECTION  '79  '84  . COLONOSCOPY    . EP IMPLANTABLE DEVICE N/A 03/12/2016   Procedure: Loop Recorder  Insertion;  Surgeon: Will Meredith Leeds, MD;  Location: [facility] CV LAB;  Service: Cardiovascular;  Laterality: N/A;  . LEFT HEART CATHETERIZATION WITH CORONARY ANGIOGRAM N/A 10/26/2013   Procedure: LEFT HEART CATHETERIZATION WITH CORONARY ANGIOGRAM;  Surgeon: Sinclair Grooms, MD;  Location: [facility] CATH LAB;  Service: Cardiovascular;  Laterality: N/A;  . ROTATOR CUFF REPAIR  '09   left shoulder  . TEE WITHOUT CARDIOVERSION N/A 03/12/2016   Procedure: TRANSESOPHAGEAL ECHOCARDIOGRAM (TEE);  Surgeon: Sanda Klein, MD;  Location: [facility] ENDOSCOPY;  Service: Cardiovascular;  Laterality: N/A;  . TUBAL LIGATION  '85  . ULNAR NERVE REPAIR  '02, '08   left done, then right    OB History    No data available       Home Medications    Prior to Admission medications   Medication Sig Start Date End Date Taking? Authorizing Provider  docusate sodium (COLACE) 100 MG capsule Take 1 capsule (100 mg total) by mouth 2 (two) times daily. 04/01/16  Yes Ivan Anchors Love, PA-C  HUMALOG KWIKPEN 100 UNIT/ML KiwkPen Inject 30 Units into the skin 2 (two) times daily. And before breakfast and dinner 10/07/16  Yes Historical Provider, MD  pantoprazole (PROTONIX) 40 MG tablet Take 1 tablet (40 mg total) by mouth daily. 04/01/16  Yes Ivan Anchors Love, PA-C  tiZANidine (ZANAFLEX) 2 MG tablet Take 2 mg by mouth every 8 (eight) hours as needed for muscle spasms.  10/26/15  Yes Historical Provider, MD  topiramate (TOPAMAX) 50 MG tablet Take 50 mg by mouth 2 (two) times daily.   Yes Historical Provider, MD  acetaminophen (TYLENOL) 325 MG tablet Take 2 tablets (650 mg total) by mouth every 6 (six) hours as needed for mild pain (or temp >/= 99.5 F). 04/01/16   Ivan Anchors Love, PA-C  albuterol (PROVENTIL HFA;VENTOLIN HFA) 108 (90 BASE) MCG/ACT inhaler Inhale 2 puffs into the lungs every 6 (six) hours as needed for wheezing or shortness of breath.     Historical Provider, MD  carvedilol (COREG) 6.25 MG tablet Take 1 tablet by mouth 2 (two)  times daily. 09/15/16   Historical Provider, MD  clopidogrel (PLAVIX) 75 MG tablet Take 1 tablet (75 mg total) by mouth daily. 03/14/16   Orson Eva, MD  fenofibrate 160 MG tablet Take 160 mg by mouth at bedtime.     Historical Provider, MD  FLUoxetine (PROZAC) 10 MG capsule Take 1 capsule (10 mg total) by mouth daily. 04/01/16   Bary Leriche, PA-C  furosemide (LASIX) 20 MG tablet Take 0.5 tablets (10 mg total) by mouth daily. 04/01/16   Bary Leriche, PA-C  gabapentin (NEURONTIN) 300 MG capsule Take 300 mg by mouth 3 (three) times daily.    Historical Provider, MD  HYDROcodone-acetaminophen (NORCO) 10-325 MG tablet 1 tablet every 6 (six) hours as needed.  07/05/16  Historical Provider, MD  insulin aspart (NOVOLOG) 100 UNIT/ML injection Inject 10-25 Units into the skin 3 (three) times daily with meals. 07/24/16   Philemon Kingdom, MD  insulin glargine (LANTUS) 100 UNIT/ML injection Inject 30 units in am and 20 units at bedtime. 07/24/16   Philemon Kingdom, MD  levothyroxine (SYNTHROID, LEVOTHROID) 100 MCG tablet Take 1 tablet (100 mcg total) by mouth daily before breakfast. 04/01/16   Ivan Anchors Love, PA-C  Menthol-Methyl Salicylate (MUSCLE RUB) 10-15 % CREA Apply 1 application topically 2 (two) times daily as needed for muscle pain. For right hip pain 04/01/16   Ivan Anchors Love, PA-C  metoCLOPramide (REGLAN) 10 MG tablet Take 10 mg by mouth 4 (four) times daily.     Historical Provider, MD  mometasone (NASONEX) 50 MCG/ACT nasal spray Place 2 sprays into the nose daily. 08/30/15   Asencion Partridge Dohmeier, MD  mometasone-formoterol (DULERA) 200-5 MCG/ACT AERO Inhale 2 puffs into the lungs 2 (two) times daily. 04/01/16   Ivan Anchors Love, PA-C  montelukast (SINGULAIR) 10 MG tablet Take 10 mg by mouth at bedtime.    Historical Provider, MD  Multiple Vitamin (MULTIVITAMIN WITH MINERALS) TABS tablet Take 1 tablet by mouth daily. One A Day 50+    Historical Provider, MD  nitroGLYCERIN (NITROSTAT) 0.4 MG SL tablet Place 1 tablet  (0.4 mg total) under the tongue every 5 (five) minutes as needed for chest pain. 10/29/13   Rhonda G Barrett, PA-C  polyethylene glycol (MIRALAX / GLYCOLAX) packet Take 17 g by mouth every other day.     Historical Provider, MD  rosuvastatin (CRESTOR) 20 MG tablet Take 1 tablet (20 mg total) by mouth daily. 04/01/16   Bary Leriche, PA-C    Family History Family History  Problem Relation Age of Onset  . COPD Father   . Stroke Father   . Emphysema Father   . Heart failure Mother   . Heart attack Mother     several  . Diabetes Mother   . Kidney disease Mother   . Heart failure Brother   . Hypertension Brother   . Colon cancer  45    mat. 1st cousin  . Other      celiac sprue, 1/2 brother  . Diabetes Brother     x 3  . Lung cancer Brother   . Cirrhosis Sister     liver transplant  . Liver disease Sister     transplant  . Heart attack Paternal Grandfather   . Heart attack Brother   . Colon cancer Paternal Aunt 84  . Esophageal cancer Neg Hx   . Stomach cancer Neg Hx   . Rectal cancer Neg Hx     Social History Social History  Substance Use Topics  . Smoking status: Never Smoker  . Smokeless tobacco: Never Used  . Alcohol use No     Allergies   Invokana [canagliflozin]; Nsaids; and Penicillins   Review of Systems Review of Systems  Unable to perform ROS: Acuity of condition  Constitutional: Negative for fever.  Respiratory: Positive for shortness of breath. Negative for cough.   Cardiovascular: Positive for chest pain and leg swelling.  Gastrointestinal: Negative for abdominal pain, blood in stool, nausea and vomiting.  Neurological: Negative for headaches.  All other systems reviewed and are negative.    Physical Exam Updated Vital Signs BP 103/63 (BP Location: Left Arm)   Pulse (!) 133   Resp 22   Ht 5\' 1"  (1.549 m)   Wt 197  lb 1.5 oz (89.4 kg)   SpO2 94%   BMI 37.24 kg/m   Physical Exam  Constitutional: She is oriented to person, place, and time.    Obese, diaphoretic upon arrival, no distress  HENT:  Head: Normocephalic and atraumatic.  Cardiovascular: Regular rhythm and normal heart sounds.   Tachycardia  Pulmonary/Chest: Effort normal. No respiratory distress. She has no wheezes.  Diffuse crackles bilateral bases, fair air movement  Abdominal: Soft. Bowel sounds are normal. There is no tenderness. There is no guarding.  Musculoskeletal: She exhibits edema.  Neurological: She is alert and oriented to person, place, and time.  Baseline deficits secondary to CVA  Skin: Skin is warm. Capillary refill takes less than 2 seconds.  Sweaty  Psychiatric: She has a normal mood and affect.  Nursing note and vitals reviewed.    ED Treatments / Results  Labs (all labs ordered are listed, but only abnormal results are displayed) Labs Reviewed  CBC - Abnormal; Notable for the following:       Result Value   WBC 17.9 (*)    Hemoglobin 15.1 (*)    All other components within normal limits  DIFFERENTIAL - Abnormal; Notable for the following:    Neutro Abs 13.7 (*)    All other components within normal limits  PROTIME-INR  APTT  COMPREHENSIVE METABOLIC PANEL  TROPONIN I  LIPID PANEL    EKG  EKG Interpretation None       Radiology No results found.  Procedures Procedures (including critical care time)  CRITICAL CARE Performed by: Merryl Hacker   Total critical care time: 30 minutes  Critical care time was exclusive of separately billable procedures and treating other patients.  Critical care was necessary to treat or prevent imminent or life-threatening deterioration.  Critical care was time spent personally by me on the following activities: development of treatment plan with patient and/or surrogate as well as nursing, discussions with consultants, evaluation of patient's response to treatment, examination of patient, obtaining history from patient or surrogate, ordering and performing treatments and  interventions, ordering and review of laboratory studies, ordering and review of radiographic studies, pulse oximetry and re-evaluation of patient's condition.   Medications Ordered in ED Medications  0.9 %  sodium chloride infusion (20 mL/hr Intravenous New Bag/Given 11/19/16 0703)  heparin injection 4,000 Units (4,000 Units Intravenous Given 11/19/16 0704)     Initial Impression / Assessment and Plan / ED Course  I have reviewed the triage vital signs and the nursing notes.  Pertinent labs & imaging results that were available during my care of the patient were reviewed by me and considered in my medical decision making (see chart for details).     Patient presents with shortness of breath. Did have one episode of chest pain in route which resolved with nitroglycerin. EKG is markedly abnormal with inferior lateral changes suggestive of acute MI. Given her pulmonary exam, concern for acute pulmonary edema as well. She is stable at this time and satting 90% on room air. Cardiology, Dr. Burt Knack is at the bedside.  Requesting Foley catheter and IV Lasix prior to taking to the Cath Lab given her evidence of pulmonary edema. She was also given IV heparin. She has received aspirin.    Final Clinical Impressions(s) / ED Diagnoses   Final diagnoses:  ST elevation myocardial infarction (STEMI) involving other coronary artery (HCC)  SOB (shortness of breath)    New Prescriptions New Prescriptions   No medications on file  Merryl Hacker, MD 11/19/16 [phone]

## 2016-11-19 NOTE — H&P (Signed)
History and Physical  Patient ID: Danielle Vargas MRN: [id number], SOB: 01/02/1951 66 y.o. Date of Encounter: 11/19/2016, 7:22 AM  Primary Physician: Marton Redwood, MD Primary Cardiologist: Dr Tamala Julian  Chief Complaint: Shortness of breath/Chest pain  HPI: 66 y.o. female w/ PMHx significant for CAD, diabetes, stroke,  who presented to [facility] on 11/19/2016 with complaints of shortness of breath.    The patient has a history of coronary artery disease. She had a lateral STEMI in 2015 secondary to acute occlusion of the first obtuse marginal branch. 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 She was treated with balloon angioplasty. LVEF at the time was 35-40%, but improved on follow-up echo to 55%. In the interim, the patient has had a right MCA stroke. She has residual left-sided weakness. She ambulates with a cane.  The patient woke up this morning with shortness of breath and substernal chest pressure. It sounds like shortness of breath and cough are the primary complaints. She states last night she was in her normal state of health. She's had no fever, chills, or GI symptoms. She continues to have shortness of breath at the time of my evaluation today. Chest pain has resolved. She did have chest pain in [place] requiring nitroglycerin with resolution of her pain.    Past Medical History:  Diagnosis Date  . Allergy   . Asthma   . CAD (coronary artery disease)    a. 12/2008 Cath: mild irregs throughout, EF 75%.  . Cataract    "just beginning"  . Colon polyps 7/07   hyperplastic and adenomatous  . Depression    pt unsure, psychologist said no  . Diverticulosis   . DM (diabetes mellitus) ([place])   . Dyslipidemia   . External hemorrhoid   . Fatty liver   . Gastroparesis   . HTN (hypertension)   . Hyperlipidemia   . 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 Hypothyroidism   . Migraine   . Myocardial infarction    FEb 2015  . Nephrolithiasis    . Obesity   . Osteoporosis   . Sleep apnea    wears CPAP  . Stroke [facility])      Surgical History:  Past Surgical History:  Procedure Laterality Date  . BREAST REDUCTION SURGERY  '88  . CARPAL TUNNEL RELEASE  2000   right wrist  . CERVICAL DISC SURGERY  '98   fusion  . CESAREAN SECTION  '79  '84  . COLONOSCOPY    . EP IMPLANTABLE DEVICE N/A 03/12/2016   Procedure: Loop Recorder Insertion;  Surgeon: Will Meredith Leeds, MD;  Location: [facility];  Service: Cardiovascular;  Laterality: N/A;  . LEFT HEART CATHETERIZATION WITH CORONARY ANGIOGRAM N/A 10/26/2013   Procedure: LEFT HEART CATHETERIZATION WITH CORONARY ANGIOGRAM;  Surgeon: Sinclair Grooms, MD;  Location: [facility] CATH LAB;  Service: Cardiovascular;  Laterality: N/A;  . ROTATOR CUFF REPAIR  '09   left shoulder  . TEE WITHOUT CARDIOVERSION N/A 03/12/2016   Procedure: TRANSESOPHAGEAL ECHOCARDIOGRAM (TEE);  Surgeon: Sanda Klein, MD;  Location: [facility] ENDOSCOPY;  Service: Cardiovascular;  Laterality: N/A;  . TUBAL LIGATION  '85  . ULNAR NERVE REPAIR  '02, '08   left done, then right     Home Meds: Prior to Admission medications   Medication Sig Start Date End Date Taking? Authorizing Provider  docusate sodium (COLACE) 100 MG capsule Take 1 capsule (100 mg total) by mouth 2 (two) times  daily. 04/01/16  Yes Bary Leriche, PA-C  HUMALOG KWIKPEN 100 UNIT/ML KiwkPen Inject 30 Units into the skin 2 (two) times daily. And before breakfast and dinner 10/07/16  Yes Historical Provider, MD  pantoprazole (PROTONIX) 40 MG tablet Take 1 tablet (40 mg total) by mouth daily. 04/01/16  Yes Ivan Anchors Love, PA-C  tiZANidine (ZANAFLEX) 2 MG tablet Take 2 mg by mouth every 8 (eight) hours as needed for muscle spasms.  10/26/15  Yes Historical Provider, MD  topiramate (TOPAMAX) 50 MG tablet Take 50 mg by mouth 2 (two) times daily.   Yes Historical Provider, MD  acetaminophen (TYLENOL) 325 MG tablet Take 2 tablets (650 mg total) by mouth every 6 (six)  hours as needed for mild pain (or temp >/= 99.5 F). 04/01/16   Ivan Anchors Love, PA-C  albuterol (PROVENTIL HFA;VENTOLIN HFA) 108 (90 BASE) MCG/ACT inhaler Inhale 2 puffs into the lungs every 6 (six) hours as needed for wheezing or shortness of breath.     Historical Provider, MD  carvedilol (COREG) 6.25 MG tablet Take 1 tablet by mouth 2 (two) times daily. 09/15/16   Historical Provider, MD  clopidogrel (PLAVIX) 75 MG tablet Take 1 tablet (75 mg total) by mouth daily. 03/14/16   Orson Eva, MD  fenofibrate 160 MG tablet Take 160 mg by mouth at bedtime.     Historical Provider, MD  FLUoxetine (PROZAC) 10 MG capsule Take 1 capsule (10 mg total) by mouth daily. 04/01/16   Bary Leriche, PA-C  furosemide (LASIX) 20 MG tablet Take 0.5 tablets (10 mg total) by mouth daily. 04/01/16   Bary Leriche, PA-C  gabapentin (NEURONTIN) 300 MG capsule Take 300 mg by mouth 3 (three) times daily.    Historical Provider, MD  HYDROcodone-acetaminophen (NORCO) 10-325 MG tablet 1 tablet every 6 (six) hours as needed.  07/05/16   Historical Provider, MD  insulin aspart (NOVOLOG) 100 UNIT/ML injection Inject 10-25 Units into the skin 3 (three) times daily with meals. 07/24/16   Philemon Kingdom, MD  insulin glargine (LANTUS) 100 UNIT/ML injection Inject 30 units in am and 20 units at bedtime. 07/24/16   Philemon Kingdom, MD  levothyroxine (SYNTHROID, LEVOTHROID) 100 MCG tablet Take 1 tablet (100 mcg total) by mouth daily before breakfast. 04/01/16   Ivan Anchors Love, PA-C  Menthol-Methyl Salicylate (MUSCLE RUB) 10-15 % CREA Apply 1 application topically 2 (two) times daily as needed for muscle pain. For right hip pain 04/01/16   Ivan Anchors Love, PA-C  metoCLOPramide (REGLAN) 10 MG tablet Take 10 mg by mouth 4 (four) times daily.     Historical Provider, MD  mometasone (NASONEX) 50 MCG/ACT nasal spray Place 2 sprays into the nose daily. 08/30/15   Asencion Partridge Dohmeier, MD  mometasone-formoterol (DULERA) 200-5 MCG/ACT AERO Inhale 2 puffs into the  lungs 2 (two) times daily. 04/01/16   Ivan Anchors Love, PA-C  montelukast (SINGULAIR) 10 MG tablet Take 10 mg by mouth at bedtime.    Historical Provider, MD  Multiple Vitamin (MULTIVITAMIN WITH MINERALS) TABS tablet Take 1 tablet by mouth daily. One A Day 50+    Historical Provider, MD  nitroGLYCERIN (NITROSTAT) 0.4 MG SL tablet Place 1 tablet (0.4 mg total) under the tongue every 5 (five) minutes as needed for chest pain. 10/29/13   Rhonda G Barrett, PA-C  polyethylene glycol (MIRALAX / GLYCOLAX) packet Take 17 g by mouth every other day.     Historical Provider, MD  rosuvastatin (CRESTOR) 20 MG tablet Take 1 tablet (  20 mg total) by mouth daily. 04/01/16   Bary Leriche, PA-C    Allergies:  Allergies  Allergen Reactions  . Invokana [Canagliflozin] Diarrhea  . Nsaids Other (See Comments)    GI problems/ stomach pain  . Penicillins Rash    Has patient had a PCN reaction causing immediate rash, facial/tongue/throat swelling, SOB or lightheadedness with hypotension: No (rash on legs after 3 days [place] -June 2016) Has patient had a PCN reaction causing severe rash involving mucus membranes or skin necrosis: No Has patient had a PCN reaction that required hospitalization No Has patient had a PCN reaction occurring within the last 10 years: Yes If all of the above answers are "NO", then may proceed with Cephalosporin use.    Social History   Social History  . Marital status: Divorced    Spouse name: N/A  . Number of children: 2  . Years of education: N/A   Occupational History  . disabled Unemployed   Social History Main Topics  . Smoking status: Never Smoker  . Smokeless tobacco: Never Used  . Alcohol use No  . Drug use: No  . Sexual activity: Not Currently    Birth control/ protection: Post-menopausal   Other Topics Concern  . Not on file   Social History Narrative   Lives in [facility] alone.  Retired.  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   Regular exercise: none    Caffeine use: daily; dt coke              Family History  Problem Relation Age of Onset  . COPD Father   . Stroke Father   . Emphysema Father   . Heart failure Mother   . Heart attack Mother     several  . Diabetes Mother   . Kidney disease Mother   . Heart failure Brother   . Hypertension Brother   . Colon cancer  45    mat. 1st cousin  . Other      celiac sprue, 1/2 brother  . Diabetes Brother     x 3  . Lung cancer Brother   . Cirrhosis Sister     liver transplant  . Liver disease Sister     transplant  . Heart attack Paternal Grandfather   . Heart attack Brother   . Colon cancer Paternal Aunt 36  . Esophageal cancer Neg Hx   . Stomach cancer Neg Hx   . Rectal cancer Neg Hx     Review of Systems: General: negative for chills, fever, night sweats or weight changes.  ENT: negative for rhinorrhea or epistaxis Cardiovascular: diaphoresis, otherwise per HPI Dermatological: negative for rash Respiratory: positive for cough GI: negative for nausea, vomiting, diarrhea, bright red blood per rectum, melena, or hematemesis GU: no hematuria, urgency, or frequency Neurologic: negative for visual changes, syncope, headache, or dizziness. Positive for left sided weakness Heme: no easy bruising or bleeding Endo: negative for excessive thirst, thyroid disorder, or flushing Musculoskeletal: negative for joint pain or swelling, negative for myalgias All other systems reviewed and are otherwise negative except as noted above.  Physical Exam: Blood pressure 103/63, pulse (!) 133, resp. rate 22, height 5\' 1"  (1.549 m), weight 197 lb 1.5 oz (89.4 kg), SpO2 94 %. General: obese, pale, woman, mild respiratory distress, alert and oriented HEENT: Normocephalic, atraumatic, sclera anicteric Neck: Supple. Carotids 2+ without bruits. JVP normal Lungs: diffuse rales, rhonchi Heart: tachy and regular with normal S1 and S2. No murmurs,  rubs, or gallops appreciated. Abdomen: Soft,  non-tender, non-distended with normoactive bowel sounds. No hepatomegaly. No rebound/guarding. No obvious abdominal masses. Back: No CVA tenderness Msk:  Strength and tone appear normal for age. Extremities: No clubbing, cyanosis, or edema.  Distal pedal pulses are 2+ and equal bilaterally. Neuro: CNII-XII intact, moves all extremities spontaneously. Psych:  Responds to questions appropriately with a normal affect. Skin: pale, dry   Labs:   Lab Results  Component Value Date   WBC 17.9 (H) 11/19/2016   HGB 15.1 (H) 11/19/2016   HCT 44.5 11/19/2016   MCV 91.9 11/19/2016   PLT 375 11/19/2016   No results for input(s): NA, K, CL, CO2, BUN, CREATININE, CALCIUM, PROT, BILITOT, ALKPHOS, ALT, AST, GLUCOSE in the last 168 hours.  Invalid input(s): LABALBU No results for input(s): CKTOTAL, CKMB, TROPONINI in the last 72 hours. Lab Results  Component Value Date   CHOL 138 03/11/2016   HDL 29 (L) 03/11/2016   LDLCALC 63 03/11/2016   TRIG 232 (H) 03/11/2016   Lab Results  Component Value Date   DDIMER <0.27 09/17/2013    Radiology/Studies:  No results found.   EKG: sinus tachycardia, inferolateral STEMI pattern  CARDIAC STUDIES: Cardiac catheterization films from 2015 reviewed. There is nonobstructive disease in the left main, LAD, and right coronary arteries. There is total occlusion of the distal portion of the first OM.  2-D echocardiogram 03/11/2016: Impressions:  - Very limited study. Only a few parasternal window images were   obtained, after which the patient was unable to tolerate the   study.   The left ventricle appears normal in overall size and function,   but wall motion cannot be analyzed.   The left atrium is athe upper limit of normal in size.   The mitral valve appears grossly normal.   The other valves cannot be evaluated. The right heart chambers   are not seen. Probably no pericardial effusion.  ASSESSMENT AND PLAN:  1. Acute respiratory failure likely  secondary to acute pulmonary edema: Differential diagnosis includes ischemia mediated pulmonary edema, hypertensive emergency as most likely causes. The patient has received nitroglycerin and her blood pressure is improved. Will place a Foley catheter and give her IV Lasix for diuresis. Chest x-ray is performed with interpretation pending, on my initial review there appears to be bilateral pulmonary edema with opacities in both lower lung fields.  2. Probable inferolateral STEMI: The patient's EKG meets criteria for an acute inferolateral STEMI, although there is some difficulty in interpreting this with her tachycardia and known past history of lateral STEMI. Because of her high risk presentation with acute pulmonary edema, I think emergency cardiac catheterization is indicated. Emergency implied consent is obtained. The patient is received aspirin and IV heparin in the emergency department. A Foley catheter is placed and IV diuretics administered so that her respiratory status will allow [place] to perform the procedure.  3. Type 2 diabetes, insulin-requiring: Will cover with sliding scale insulin while hospitalized. Will check hemoglobin A1c.  4. History of stroke with residual left-sided weakness: Continue secondary risk reduction measures  Disposition: Pending cardiac catheterization results. The patient is at high risk of adverse outcomes with her past history of stroke, diabetes, obesity, presentation with acute respiratory failure and pulmonary edema, and acute MI. There is a non-system delay in bringing her up to the cardiac catheterization lab because of her respiratory status. It was necessary to place Foley catheter and administer IV diuretics in the emergency department.  Signed, Burt Knack,  Legrand Como MD 11/19/2016, 7:22 AM

## 2016-11-19 NOTE — Progress Notes (Signed)
Responded to page from ED, learned from EMS that pt's daughter was likely on the way, alerted [facility] as to that, then, when daughter did not arrive before pt went to Cath lab, also to pt's new location. Chaplain available for f/u.   11/19/16 0800  Clinical Encounter Type  Visited With Patient not available  Visit Type Initial;Psychological support;Spiritual support;Social support;ED  Referral From Nurse   Gerrit Heck, Chaplain

## 2016-11-19 NOTE — Progress Notes (Signed)
Transported from Cath Lab to 4N 22 with Cath Lab team without complications.

## 2016-11-19 NOTE — ED Notes (Signed)
Patient to ed via [facility] c/o sob, patient is a resident at [facility].Patient very clammy room air sats 76%, ems placed patient on NRB and sats increased to 96%. After patient was in the back of the ems unit started c/o chest pain rates 5/10 was given 1 ntg and 324mg  asa . After ntg states she is pain free. Patient has a hx. Of MI in 2015 states pain is similar to that. Also hx. Left sided weakness from CVA. Upon arrival patient is pale clammy, denies chest pain lungs sounds with rales . Patient is alert oriented.

## 2016-11-19 NOTE — Progress Notes (Signed)
Called to cath lab for BIPAP per Dr. Burt Knack.

## 2016-11-20 ENCOUNTER — Inpatient Hospital Stay (HOSPITAL_COMMUNITY): Payer: Medicare Other

## 2016-11-20 DIAGNOSIS — I509 Heart failure, unspecified: Secondary | ICD-10-CM

## 2016-11-20 DIAGNOSIS — I5041 Acute combined systolic (congestive) and diastolic (congestive) heart failure: Secondary | ICD-10-CM

## 2016-11-20 LAB — CBC
HEMATOCRIT: 41.3 % (ref 36.0–46.0)
HEMOGLOBIN: 13.5 g/dL (ref 12.0–15.0)
MCH: 30.1 pg (ref 26.0–34.0)
MCHC: 32.7 g/dL (ref 30.0–36.0)
MCV: 92.2 fL (ref 78.0–100.0)
Platelets: 299 10*3/uL (ref 150–400)
RBC: 4.48 MIL/uL (ref 3.87–5.11)
RDW: 14.5 % (ref 11.5–15.5)
WBC: 12.7 10*3/uL — AB (ref 4.0–10.5)

## 2016-11-20 LAB — GLUCOSE, CAPILLARY
GLUCOSE-CAPILLARY: 183 mg/dL — AB (ref 65–99)
Glucose-Capillary: 238 mg/dL — ABNORMAL HIGH (ref 65–99)
Glucose-Capillary: 292 mg/dL — ABNORMAL HIGH (ref 65–99)
Glucose-Capillary: 326 mg/dL — ABNORMAL HIGH (ref 65–99)

## 2016-11-20 LAB — HIV ANTIBODY (ROUTINE TESTING W REFLEX): HIV Screen 4th Generation wRfx: NONREACTIVE

## 2016-11-20 LAB — BASIC METABOLIC PANEL
ANION GAP: 11 (ref 5–15)
BUN: 26 mg/dL — ABNORMAL HIGH (ref 6–20)
CHLORIDE: 105 mmol/L (ref 101–111)
CO2: 24 mmol/L (ref 22–32)
Calcium: 9.3 mg/dL (ref 8.9–10.3)
Creatinine, Ser: 1.23 mg/dL — ABNORMAL HIGH (ref 0.44–1.00)
GFR calc non Af Amer: 45 mL/min — ABNORMAL LOW (ref >60)
GFR, EST AFRICAN AMERICAN: 53 mL/min — AB (ref >60)
Glucose, Bld: 198 mg/dL — ABNORMAL HIGH (ref 65–99)
POTASSIUM: 3.5 mmol/L (ref 3.5–5.1)
SODIUM: 140 mmol/L (ref 135–145)

## 2016-11-20 LAB — LIPID PANEL
CHOL/HDL RATIO: 5.1 ratio
Cholesterol: 117 mg/dL (ref 0–200)
HDL: 23 mg/dL — AB (ref >40)
LDL CALC: 49 mg/dL (ref 0–99)
TRIGLYCERIDES: 225 mg/dL — AB (ref <150)
VLDL: 45 mg/dL — AB (ref 0–40)

## 2016-11-20 LAB — ECHOCARDIOGRAM COMPLETE
Height: 61 in
WEIGHTICAEL: 3121.71 [oz_av]

## 2016-11-20 MED ORDER — PERFLUTREN LIPID MICROSPHERE
1.0000 mL | INTRAVENOUS | Status: AC | PRN
  Administered 2016-11-20: 11:00:00 via INTRAVENOUS
  Administered 2016-11-20: 2 mL via INTRAVENOUS
  Filled 2016-11-20: qty 10

## 2016-11-20 MED ORDER — PERFLUTREN LIPID MICROSPHERE
INTRAVENOUS | Status: AC
  Filled 2016-11-20: qty 10

## 2016-11-20 MED ORDER — POTASSIUM CHLORIDE 20 MEQ PO PACK
40.0000 meq | PACK | Freq: Once | ORAL | Status: AC
  Administered 2016-11-20: 40 meq via ORAL
  Filled 2016-11-20 (×2): qty 2

## 2016-11-20 MED ORDER — ISOSORBIDE MONONITRATE ER 30 MG PO TB24
30.0000 mg | ORAL_TABLET | Freq: Every day | ORAL | Status: DC
  Administered 2016-11-20 – 2016-11-25 (×6): 30 mg via ORAL
  Filled 2016-11-20 (×6): qty 1

## 2016-11-20 NOTE — Progress Notes (Signed)
Progress Note  Patient Name: Danielle Vargas Date of Encounter: 11/20/2016  Primary Cardiologist: Dr. Tamala Julian (Last seen 10/2014)  Subjective   Breathing improving.  Chest pain resolved. She is wheelchair bound at home due to stroke.   Inpatient Medications    Scheduled Meds: . aspirin EC  81 mg Oral Daily  . carvedilol  3.125 mg Oral BID WC  . docusate sodium  100 mg Oral BID  . fenofibrate  160 mg Oral QHS  . FLUoxetine  10 mg Oral Daily  . fluticasone  2 spray Each Nare Daily  . furosemide  40 mg Intravenous Q12H  . gabapentin  300 mg Oral TID  . heparin  5,000 Units Subcutaneous Q8H  . insulin aspart  0-15 Units Subcutaneous TID WC  . insulin glargine  20 Units Subcutaneous QHS  . levothyroxine  100 mcg Oral QAC breakfast  . mouth rinse  15 mL Mouth Rinse BID  . metoCLOPramide  10 mg Oral QID  . mometasone-formoterol  2 puff Inhalation BID  . montelukast  10 mg Oral QHS  . pantoprazole  40 mg Oral Daily  . polyethylene glycol  17 g Oral QODAY  . rosuvastatin  20 mg Oral QHS  . sodium chloride flush  3 mL Intravenous Q12H  . ticagrelor  90 mg Oral BID  . tiZANidine  2 mg Oral TID  . topiramate  50 mg Oral BID   Continuous Infusions: . sodium chloride 20 mL/hr (11/20/16 0600)  . nitroGLYCERIN Stopped (11/19/16 1839)   PRN Meds: sodium chloride, acetaminophen, albuterol, HYDROcodone-acetaminophen, nitroGLYCERIN, ondansetron (ZOFRAN) IV, sodium chloride flush   Vital Signs    Vitals:   11/20/16 0600 11/20/16 0644 11/20/16 0700 11/20/16 0744  BP: 111/61  123/69   Pulse: 96  97   Resp: 17  (!) 21   Temp:    99.1 F (37.3 C)  TempSrc:    Axillary  SpO2: 96% 95% 97%   Weight:      Height:        Intake/Output Summary (Last 24 hours) at 11/20/16 0744 Last data filed at 11/20/16 0644  Gross per 24 hour  Intake           743.25 ml  Output             2295 ml  Net         -1551.75 ml   Filed Weights   11/19/16 0600 11/20/16 0500  Weight: 197 lb 1.5 oz (89.4  kg) 195 lb 1.7 oz (88.5 kg)    Telemetry    Sinus rhythm with rate mostly on 100s - Personally Reviewed  ECG    NSR with resolution of ST elevation  - Personally Reviewed  Physical Exam   GEN: No acute distress.   Neck: No JVD Cardiac: RRR, no murmurs, rubs, or gallops. Right groin without hematoma  Respiratory: Clear to auscultation bilaterally. GI: Soft, nontender, non-distended  MS: No edema; No deformity. Neuro:  Nonfocal  Psych: Normal affect   Labs    Chemistry Recent Labs Lab 11/19/16 0655 11/19/16 1028 11/20/16 0331  NA 139  --  140  K 4.2  --  3.5  CL 107  --  105  CO2 17*  --  24  GLUCOSE 252*  --  198*  BUN 22*  --  26*  CREATININE 1.17* 1.14* 1.23*  CALCIUM 9.3  --  9.3  PROT 7.5  --   --   ALBUMIN 3.4*  --   --  AST 54*  --   --   ALT 24  --   --   ALKPHOS 51  --   --   BILITOT 0.8  --   --   GFRNONAA 48* 50* 45*  GFRAA 56* 58* 53*  ANIONGAP 15  --  11     Hematology Recent Labs Lab 11/19/16 0655 11/19/16 1028 11/20/16 0331  WBC 17.9* 14.2* 12.7*  RBC 4.84 4.82 4.48  HGB 15.1* 14.5 13.5  HCT 44.5 44.3 41.3  MCV 91.9 91.9 92.2  MCH 31.2 30.1 30.1  MCHC 33.9 32.7 32.7  RDW 14.3 14.1 14.5  PLT 375 350 299    Cardiac Enzymes Recent Labs Lab 11/19/16 0655 11/19/16 1028 11/19/16 1509 11/19/16 2121  TROPONINI 1.72* 51.68* 48.47* 39.39*   No results for input(s): TROPIPOC in the last 168 hours.   BNP Recent Labs Lab 11/19/16 1028  BNP 275.0*     DDimer No results for input(s): DDIMER in the last 168 hours.   Radiology    Dg Chest Portable 1 View  Result Date: 11/19/2016 CLINICAL DATA:  Cough and chest pain EXAM: PORTABLE CHEST 1 VIEW COMPARISON:  March 10, 2016 FINDINGS: There is mild atelectatic change in the left base. Lungs elsewhere clear. Heart is borderline prominent with pulmonary vascularity within normal limits. No adenopathy. There is a loop recorder just to the right of midline. There is postoperative change in  the lower cervical spine. There is evidence of old trauma involving the lateral left clavicle. No pneumothorax. IMPRESSION: Mild atelectatic change in the left base. No edema or consolidation. Heart borderline prominent, stable. Electronically Signed   By: Lowella Grip III M.D.   On: 11/19/2016 07:30    Cardiac Studies   Coronary Balloon Angioplasty 11/19/16  Left Heart Cath and Coronary Angiography  Conclusion   1. Acute inferolateral STEMI secondary to thrombotic occlusion of the distal left circumflex, treated successfully with balloon angioplasty 2. Acute systolic heart failure with markedly elevated LVEDP, improved with IV nitroglycerin, IV Lasix, and BiPAP 3. Minor nonobstructive disease in the RCA, left main, and LAD 4. Severe LV systolic dysfunction with hyperkinetic basal segments and dyskinesis of the anterolateral, periapical, and infero-apical walls, LVEF estimated at 25-30%  Recommendations:  Aspirin and brilinta (dual antiplatelet therapy) 12 months  Continue BiPAP and IV nitroglycerin. Wean BiPAP as tolerated  Start low-dose beta blocker as tolerated  ICU admission   Pending echo   Patient Profile     66 y.o. female w/ PMHx significant for CAD, diabetes, stroke,  who presented to [facility] on [facility]/19/2016 with complaints of shortness of breath. Found to have a acute respiratory failure 2nd to pulmonary edema and hypertensive urgency. Taken to cath lab for probable inferolateral STEMI.  Assessment & Plan    1. Acute inferolateral STEMI - Cath showed thrombotic occlusion of the distal left circumflex, treated successfully with balloon angioplasty - Peak of troponin 51.68.  Continue DAPT with ASA and Brilinta.   2. Acute systolic Heart failure/ ICM - Elevated LVEDP by cath improved with IV nitro, IV lasix and BiPAP/ - Severe LV systolic dysfunction with hyperkinetic basal segments and dyskinesis of the anterolateral, periapical, and infero-apical walls,  LVEF estimated at 25-30% by cath - BNP 275. Pending echocardiogram - Diuresed 1.5L. Continue IV lasix 40mg  BID and Coreg. SCr minimally elevated. Consider adding ACE/ARB once resolved AKI--> likely tomorrow.   3. DM - on SSI. H1c 6.6.  4. Hx of stroke with residual Left  sided weakness  5. Hx of CAD with lateral STEMI in 2015 secondary to acute occlusion of the first obtuse marginal branch. 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 She was treated with balloon angioplasty.  - S/p Loop recorder placement  6. HLD - Continue Crestor 20mg .  11/20/2016: Cholesterol 117; HDL 23; LDL Cholesterol 49; Triglycerides 225; VLDL 45  7. Hypertensive urgency - Blood pressure improved with IV nitro.  Signed, Leanor Kail, PA  11/20/2016, 7:44 AM    Agree with note by Robbie Lis PA-C  Patient admitted with acute coronary syndrome. She had PCI of distal circumflex. There was no other obstructive disease. Her LV is consistent with " Takatsubo syndrome". She was initially on BiPAP which has been weaned to 4 L nasal prong with good saturations. She is put out close to 20 half liters on IV diuretics. She has a low-dose beta blocker. We will transition her from IV to by mouth long-acting nitrate, replete her potassium, transfer her to stepdown and titrate her medications as necessary.  Lorretta Harp, M.D., [facility], [facility], [place] [address] [place], [place]  [address]  [phone] 11/20/2016 8:57 AM

## 2016-11-20 NOTE — Progress Notes (Signed)
CARDIAC REHAB PHASE I   Pt in bed, spoke with RN prior to seeing pt. Pt from SNF, with residual L sided weakness from prior stroke. Per RN, pt required max assist x3 to transfer to bedside commode. Pt will benefit from PT consult prior to ambulation with cardiac rehab. Will follow.  Lenna Sciara, RN, BSN 11/20/2016 2:12 PM

## 2016-11-20 NOTE — Progress Notes (Signed)
Inpatient Diabetes Program Recommendations  AACE/ADA: New Consensus Statement on Inpatient Glycemic Control (2015)  Target Ranges:  Prepandial:   less than 140 mg/dL      Peak postprandial:   less than 180 mg/dL (1-2 hours)      Critically ill patients:  140 - 180 mg/dL   Results for Danielle Vargas, Danielle Vargas (MRN [id number]) as of 11/20/2016 11:34  Ref. Range 11/19/2016 09:43 11/19/2016 11:48 11/19/2016 15:54 11/19/2016 23:15 11/20/2016 07:39  Glucose-Capillary Latest Ref Range: 65 - 99 mg/dL 215 (H) 237 (H) 242 (H) 180 (H) 238 (H)   Review of Glycemic Control  Diabetes history: DM 2 Outpatient Diabetes medications: Lantus 30 units QAM, 20 units QPM, Novolog 10-25 units TID Current orders for Inpatient glycemic control: Lantus 20 units, Novolog Moderate TID  A1c 6.6% on 11/19/16  Inpatient Diabetes Program Recommendations:   Fasting glucose 238 this am. Patient takes Lantus BID at home. Please consider increasing frequency of Lantus to BID.   Thanks,  Tama Headings RN, MSN, [facility] Inpatient Diabetes Coordinator Team Pager [phone] (8a-5p)

## 2016-11-20 NOTE — Progress Notes (Signed)
  Echocardiogram 2D Echocardiogram has been performed.  Danielle Vargas 11/20/2016, 11:24 AM

## 2016-11-20 NOTE — Care Management Note (Signed)
Case Management Note  Patient Details  Name: Danielle Vargas MRN: [id number] Date of Birth: [birth year]  Subjective/Objective:   Pt admitted with  CP              Action/Plan:  Pt is from [facility] - pt intends on returning to [facility] at discharge.  CSW consulted   Expected Discharge Date:                  Expected Discharge Plan:  [place]  In-House Referral:  Clinical Social Work  Discharge planning Services  CM Consult  Post Acute Care Choice:    Choice offered to:     DME Arranged:    DME Agency:     HH Arranged:    [place] Agency:     Status of Service:  In process, will continue to follow  If discussed at Long Length of Stay Meetings, dates discussed:    Additional Comments:  Maryclare Labrador, RN 11/20/2016, 4:21 PM

## 2016-11-20 NOTE — Progress Notes (Signed)
RT placed patient on BIPAP for the night. RT will monitor as needed.

## 2016-11-21 LAB — BASIC METABOLIC PANEL
ANION GAP: 16 — AB (ref 5–15)
BUN: 31 mg/dL — ABNORMAL HIGH (ref 6–20)
CO2: 21 mmol/L — AB (ref 22–32)
Calcium: 9.1 mg/dL (ref 8.9–10.3)
Chloride: 102 mmol/L (ref 101–111)
Creatinine, Ser: 1.33 mg/dL — ABNORMAL HIGH (ref 0.44–1.00)
GFR calc Af Amer: 48 mL/min — ABNORMAL LOW (ref >60)
GFR calc non Af Amer: 41 mL/min — ABNORMAL LOW (ref >60)
GLUCOSE: 182 mg/dL — AB (ref 65–99)
Potassium: 4.3 mmol/L (ref 3.5–5.1)
Sodium: 139 mmol/L (ref 135–145)

## 2016-11-21 LAB — TROPONIN I
TROPONIN I: 11.4 ng/mL — AB (ref <0.03)
Troponin I: 10.08 ng/mL (ref <0.03)
Troponin I: 11.12 ng/mL (ref <0.03)

## 2016-11-21 LAB — GLUCOSE, CAPILLARY
GLUCOSE-CAPILLARY: 188 mg/dL — AB (ref 65–99)
GLUCOSE-CAPILLARY: 343 mg/dL — AB (ref 65–99)
GLUCOSE-CAPILLARY: 246 mg/dL — AB (ref 65–99)
Glucose-Capillary: 293 mg/dL — ABNORMAL HIGH (ref 65–99)

## 2016-11-21 LAB — BRAIN NATRIURETIC PEPTIDE: B Natriuretic Peptide: 726.9 pg/mL — ABNORMAL HIGH (ref 0.0–100.0)

## 2016-11-21 MED ORDER — FUROSEMIDE 10 MG/ML IJ SOLN
40.0000 mg | Freq: Once | INTRAMUSCULAR | Status: AC
  Administered 2016-11-21: 40 mg via INTRAVENOUS
  Filled 2016-11-21: qty 4

## 2016-11-21 MED ORDER — POTASSIUM CHLORIDE CRYS ER 20 MEQ PO TBCR
40.0000 meq | EXTENDED_RELEASE_TABLET | Freq: Once | ORAL | Status: AC
  Administered 2016-11-21: 40 meq via ORAL
  Filled 2016-11-21: qty 2

## 2016-11-21 MED ORDER — INSULIN GLARGINE 100 UNIT/ML ~~LOC~~ SOLN
20.0000 [IU] | Freq: Two times a day (BID) | SUBCUTANEOUS | Status: DC
  Administered 2016-11-21 – 2016-11-25 (×9): 20 [IU] via SUBCUTANEOUS
  Filled 2016-11-21 (×10): qty 0.2

## 2016-11-21 NOTE — Progress Notes (Signed)
CRITICAL VALUE ALERT  Critical value received:Troponin 11.12  Date of notification: 11/21/16 Time of notification:1340  Critical value read back: Lab didn't call.  Nurse who received alert: Cleotis Nipper  MD notified (1st page):  Ahmed Prima

## 2016-11-21 NOTE — Progress Notes (Signed)
Pt has scheduled lasix for 12A and 12P -- Patient became hypotensive (70s/50s MAPs 55) around 2300.  RN repositioned cuff and continued to monitor patient.  BP has slowly improved. Last BP 119/61 MAP 78.  RN paged Cards fellow, and explained situation.  MD instructed RN to hold 12A dose of Lasix.  RN will continue to monitor patient closely.

## 2016-11-21 NOTE — Progress Notes (Signed)
Progress Note  Patient Name: Danielle Vargas Date of Encounter: 11/21/2016  Primary Cardiologist: Dr. Tamala Julian (Last seen 10/2014)  Subjective   Breathing improving.  Chest pain resolved. She is wheelchair bound at home due to stroke. Slept with BiPAP last PM  Inpatient Medications    Scheduled Meds: . aspirin EC  81 mg Oral Daily  . carvedilol  3.125 mg Oral BID WC  . docusate sodium  100 mg Oral BID  . fenofibrate  160 mg Oral QHS  . FLUoxetine  10 mg Oral Daily  . fluticasone  2 spray Each Nare Daily  . furosemide  40 mg Intravenous Q12H  . furosemide  40 mg Intravenous Once  . gabapentin  300 mg Oral TID  . heparin  5,000 Units Subcutaneous Q8H  . insulin aspart  0-15 Units Subcutaneous TID WC  . insulin glargine  20 Units Subcutaneous BID  . isosorbide mononitrate  30 mg Oral Daily  . levothyroxine  100 mcg Oral QAC breakfast  . mouth rinse  15 mL Mouth Rinse BID  . metoCLOPramide  10 mg Oral QID  . mometasone-formoterol  2 puff Inhalation BID  . montelukast  10 mg Oral QHS  . pantoprazole  40 mg Oral Daily  . polyethylene glycol  17 g Oral QODAY  . rosuvastatin  20 mg Oral QHS  . sodium chloride flush  3 mL Intravenous Q12H  . ticagrelor  90 mg Oral BID  . tiZANidine  2 mg Oral TID  . topiramate  50 mg Oral BID   Continuous Infusions: . sodium chloride Stopped (11/20/16 1200)   PRN Meds: sodium chloride, acetaminophen, albuterol, HYDROcodone-acetaminophen, nitroGLYCERIN, ondansetron (ZOFRAN) IV, sodium chloride flush   Vital Signs    Vitals:   11/21/16 0500 11/21/16 0600 11/21/16 0700 11/21/16 0758  BP: 129/71 118/84 107/79   Pulse: 84 91 92   Resp: 13 13 17    Temp:      TempSrc:      SpO2: 100% 99% 99% 98%  Weight: 197 lb 1.5 oz (89.4 kg)     Height:        Intake/Output Summary (Last 24 hours) at 11/21/16 0803 Last data filed at 11/20/16 1800  Gross per 24 hour  Intake              643 ml  Output              500 ml  Net              143 ml    Filed Weights   11/19/16 0600 11/20/16 0500 11/21/16 0500  Weight: 197 lb 1.5 oz (89.4 kg) 195 lb 1.7 oz (88.5 kg) 197 lb 1.5 oz (89.4 kg)    Telemetry    Sinus rhythm with rate mostly on 100s - Personally Reviewed  ECG    Normal sinus rhythm with ST segment elevation inferiorly and laterally with anterolateral T-wave inversion. This represents a change since prior EKG but suspect this is evolution of her EKG process.  - Personally Reviewed  Physical Exam   GEN: No acute distress.   Neck: No JVD Cardiac: RRR, no murmurs, rubs, or gallops. Right groin without hematoma  Respiratory: Clear to auscultation bilaterally. GI: Soft, nontender, non-distended  MS: No edema; No deformity. Neuro:  Nonfocal  Psych: Normal affect  No change in physical exam Labs    Chemistry  Recent Labs Lab 11/19/16 0655 11/19/16 1028 11/20/16 0331  NA 139  --  140  K 4.2  --  3.5  CL 107  --  105  CO2 17*  --  24  GLUCOSE 252*  --  198*  BUN 22*  --  26*  CREATININE 1.17* 1.14* 1.23*  CALCIUM 9.3  --  9.3  PROT 7.5  --   --   ALBUMIN 3.4*  --   --   AST 54*  --   --   ALT 24  --   --   ALKPHOS 51  --   --   BILITOT 0.8  --   --   GFRNONAA 48* 50* 45*  GFRAA 56* 58* 53*  ANIONGAP 15  --  11     Hematology  Recent Labs Lab 11/19/16 0655 11/19/16 1028 11/20/16 0331  WBC 17.9* 14.2* 12.7*  RBC 4.84 4.82 4.48  HGB 15.1* 14.5 13.5  HCT 44.5 44.3 41.3  MCV 91.9 91.9 92.2  MCH 31.2 30.1 30.1  MCHC 33.9 32.7 32.7  RDW 14.3 14.1 14.5  PLT 375 350 299    Cardiac Enzymes  Recent Labs Lab 11/19/16 0655 11/19/16 1028 11/19/16 1509 11/19/16 2121  TROPONINI 1.72* 51.68* 48.47* 39.39*   No results for input(s): TROPIPOC in the last 168 hours.   BNP  Recent Labs Lab 11/19/16 1028  BNP 275.0*     DDimer No results for input(s): DDIMER in the last 168 hours.   Radiology    No results found.  Cardiac Studies   Coronary Balloon Angioplasty 11/19/16  Left Heart Cath  and Coronary Angiography  Conclusion   1. Acute inferolateral STEMI secondary to thrombotic occlusion of the distal left circumflex, treated successfully with balloon angioplasty 2. Acute systolic heart failure with markedly elevated LVEDP, improved with IV nitroglycerin, IV Lasix, and BiPAP 3. Minor nonobstructive disease in the RCA, left main, and LAD 4. Severe LV systolic dysfunction with hyperkinetic basal segments and dyskinesis of the anterolateral, periapical, and infero-apical walls, LVEF estimated at 25-30%  Recommendations:  Aspirin and brilinta (dual antiplatelet therapy) 12 months  Continue BiPAP and IV nitroglycerin. Wean BiPAP as tolerated  Start low-dose beta blocker as tolerated  ICU admission   Pending echo   Patient Profile     66 y.o. female w/ PMHx significant for CAD, diabetes, stroke,  who presented to [facility] on 11/19/2016 with complaints of shortness of breath. Found to have a acute respiratory failure 2nd to pulmonary edema and hypertensive urgency. Taken to cath lab for probable inferolateral STEMI.  Assessment & Plan    1. Acute inferolateral STEMI - Cath showed thrombotic occlusion of the distal left circumflex, treated successfully with balloon angioplasty - Peak of troponin 51.68. Last troponin on 11/20/14 was 40. Continue DAPT with ASA and Brilinta.   2. Acute systolic Heart failure/ ICM - Elevated LVEDP by cath improved with IV nitro, IV lasix and BiPAP/ - Severe LV systolic dysfunction with hyperkinetic basal segments and dyskinesis of the anterolateral, periapical, and infero-apical walls, LVEF estimated at 25-30% by cath - BNP 275. Pending echocardiogram - Diuresed 1.5L. Continue IV lasix 40mg  BID and Coreg. SCr minimally elevated. Consider adding ACE/ARB once resolved AKI. Given soft blood pressure overnight will hold off on adding ACE/ARB.  3. DM - on SSI. H1c 6.6. Increase Lantus insulin to twice a day per diabetes coordinator  recommendations  4. Hx of stroke with residual Left sided weakness. Out of bed to chair with help of physical therapy  5. Hx of CAD with lateral STEMI in 2015  secondary to acute occlusion of the first obtuse marginal branch. 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 She was treated with balloon angioplasty.  - S/p Loop recorder placement  6. HLD - Continue Crestor 20mg .  11/20/2016: Cholesterol 117; HDL 23; LDL Cholesterol 49; Triglycerides 225; VLDL 45  7. Hypertensive urgency- blood pressure better controlled on current medications.  Angelina Sheriff, MD  11/21/2016, 8:03 AM    Agree with note by Robbie Lis PA-C  Patient admitted with acute coronary syndrome. She had PCI of distal circumflex. There was no other obstructive disease. Her LV is consistent with " Takatsubo syndrome". She was initially on BiPAP which has been weaned to 4 L nasal prong with good saturations. She does wear BiPAP overnight. She is put out 1.2 L on IV diuretics but do not think this is an accurate reading.. She has a low-dose beta blocker. We will transition her from IV to by mouth long-acting nitrate, replete her potassium, transfer her to stepdown today and titrate her medications as necessary. I will continue to cycle troponins every 8 hours 3 given her EKG changes although I doubt this represents myocardial injury given her known anatomy.  Lorretta Harp, M.D., [place], [facility], [place] [address] [place],   [address]  [phone] 11/21/2016 8:03 AM

## 2016-11-21 NOTE — Progress Notes (Signed)
STEMI on EKG this morning noted, Dr. Gwenlyn Found called and made aware, one nitro SL given and bipap on.  Pt. Pain free.  Will monitor closely.

## 2016-11-21 NOTE — Evaluation (Signed)
Physical Therapy Evaluation Patient Details Name: Danielle Vargas MRN: [id number] DOB: 13-May-1951 Today's Date: 11/21/2016   History of Present Illness  Pt is a 66 y/o female admitted from [facility] secondary to SOB and chest pain, found to have an acute inferolateral STEMI and acute CHF. PMH including but not limited to CAD, DM, hx of CVA with L sided weakness, HTN and hx of STEMI in 2015.  Clinical Impression  Pt presented supine in bed with HOB elevated, awake and willing to participate in therapy session. Prior to admission, pt reported that she was living at [facility] and participating in PT 3-5x/week. Pt stated that she used a w/c primarily for mobility and required assistance for transfers. Pt currently requires max A x2 for bed mobility and max to total A x2 for transfers. All VSS throughout on RA. Pt would continue to benefit from skilled physical therapy services at this time while admitted and after d/c to address her below listed limitations in order to improve her overall safety and independence with functional mobility.      Follow Up Recommendations SNF;Supervision/Assistance - 24 hour    Equipment Recommendations  None recommended by PT    Recommendations for Other Services OT consult     Precautions / Restrictions Precautions Precautions: Fall Restrictions Weight Bearing Restrictions: No      Mobility  Bed Mobility Overal bed mobility: Needs Assistance Bed Mobility: Supine to Sit     Supine to sit: Max assist;+2 for physical assistance;HOB elevated     General bed mobility comments: max A at bilateral LEs and trunk to achieve sitting with use of bed pads to position hips at EOB  Transfers Overall transfer level: Needs assistance Equipment used: 2 person hand held assist Transfers: Sit to/from [facility] Sit to Stand: Max assist;+2 physical assistance Stand pivot transfers: Total assist;+2 physical assistance       General transfer comment:  max to total A for all aspects of transfers with use of bed pads and therapist blocking L knee   Ambulation/Gait                Stairs            Wheelchair Mobility    Modified Rankin (Stroke Patients Only)       Balance Overall balance assessment: Needs assistance Sitting-balance support: Single extremity supported Sitting balance-Leahy Scale: Poor   Postural control: Left lateral lean Standing balance support: During functional activity Standing balance-Leahy Scale: Zero Standing balance comment: total A x2                             Pertinent Vitals/Pain Pain Assessment: No/denies pain    Home Living Family/patient expects to be discharged to:: Skilled nursing facility                 Additional Comments: pt from [facility]    Prior Function Level of Independence: Needs assistance   Gait / Transfers Assistance Needed: pt reported that she uses a w/c primarily for mobility and that she required assistance with transfers  ADL's / Homemaking Assistance Needed: dependent with ADLs        Hand Dominance   Dominant Hand: Right    Extremity/Trunk Assessment   Upper Extremity Assessment Upper Extremity Assessment: LUE deficits/detail LUE Deficits / Details: pt with residual weakness from CVA; flaccid, hypotonic LUE Sensation: decreased light touch    Lower Extremity Assessment Lower Extremity Assessment: Generalized weakness;LLE  deficits/detail LLE Deficits / Details: pt with residual weakness from CVA, minimal active movement at knee into extension    Cervical / Trunk Assessment Cervical / Trunk Assessment: Other exceptions Cervical / Trunk Exceptions: preference for maintaining head in L rotation  Communication   Communication: No difficulties  Cognition Arousal/Alertness: Awake/alert Behavior During Therapy: WFL for tasks assessed/performed;Flat affect Overall Cognitive Status: Impaired/Different from baseline Area of  Impairment: Following commands;Safety/judgement;Problem solving       Following Commands: Follows one step commands with increased time Safety/Judgement: Decreased awareness of safety;Decreased awareness of deficits   Problem Solving: Slow processing;Decreased initiation;Difficulty sequencing;Requires verbal cues;Requires tactile cues      General Comments      Exercises Other Exercises Other Exercises: lateral weight shifting onto R hip with WB'ing through R forearm in sitting and pushing back up with R UE into midline   Assessment/Plan    PT Assessment Patient needs continued PT services  PT Problem List Decreased strength;Decreased range of motion;Decreased activity tolerance;Decreased balance;Decreased mobility;Decreased coordination;Decreased cognition;Decreased knowledge of use of DME;Decreased safety awareness;Decreased knowledge of precautions;Cardiopulmonary status limiting activity;Impaired sensation       PT Treatment Interventions DME instruction;Functional mobility training;Therapeutic activities;Therapeutic exercise;Balance training;Neuromuscular re-education;Patient/family education    PT Goals (Current goals can be found in the Care Plan section)  Acute Rehab PT Goals Patient Stated Goal: none stated PT Goal Formulation: With patient Time For Goal Achievement: 12/05/16 Potential to Achieve Goals: Fair    Frequency Min 2X/week   Barriers to discharge        Co-evaluation               End of Session Equipment Utilized During Treatment: Gait belt Activity Tolerance: Patient tolerated treatment well Patient left: in chair;with call bell/phone within reach;with chair alarm set Nurse Communication: Mobility status;Need for lift equipment PT Visit Diagnosis: Other abnormalities of gait and mobility (R26.89);Other symptoms and signs involving the nervous system (R29.898)         Time: 1119-1150 PT Time Calculation (min) (ACUTE ONLY): 31  min   Charges:   PT Evaluation $PT Eval Moderate Complexity: 1 Procedure PT Treatments $Therapeutic Activity: 8-22 mins   PT G CodesClearnce Sorrel Vaidehi Braddy 11/21/2016, 1:57 PM Sherie Don, PT, DPT [phone]

## 2016-11-21 NOTE — Progress Notes (Signed)
Troponin of 10.08 and BNP 726.9. Reported to Dr. Gwenlyn Found per protocol.

## 2016-11-21 NOTE — Progress Notes (Signed)
EKG CRITICAL VALUE     12 lead EKG performed.  Critical value noted.  Maryjane Hurter, RN notified.   Neva Seat, CCT 11/21/2016 8:25 AM

## 2016-11-22 LAB — BASIC METABOLIC PANEL
Anion gap: 9 (ref 5–15)
BUN: 38 mg/dL — AB (ref 6–20)
CHLORIDE: 104 mmol/L (ref 101–111)
CO2: 24 mmol/L (ref 22–32)
Calcium: 8.9 mg/dL (ref 8.9–10.3)
Creatinine, Ser: 1.47 mg/dL — ABNORMAL HIGH (ref 0.44–1.00)
GFR calc Af Amer: 42 mL/min — ABNORMAL LOW (ref >60)
GFR calc non Af Amer: 37 mL/min — ABNORMAL LOW (ref >60)
GLUCOSE: 248 mg/dL — AB (ref 65–99)
POTASSIUM: 3.8 mmol/L (ref 3.5–5.1)
Sodium: 137 mmol/L (ref 135–145)

## 2016-11-22 LAB — GLUCOSE, CAPILLARY
GLUCOSE-CAPILLARY: 403 mg/dL — AB (ref 65–99)
Glucose-Capillary: 233 mg/dL — ABNORMAL HIGH (ref 65–99)
Glucose-Capillary: 231 mg/dL — ABNORMAL HIGH (ref 65–99)
Glucose-Capillary: 297 mg/dL — ABNORMAL HIGH (ref 65–99)

## 2016-11-22 MED ORDER — LISINOPRIL 2.5 MG PO TABS
2.5000 mg | ORAL_TABLET | Freq: Every day | ORAL | Status: DC
Start: 2016-11-22 — End: 2016-11-23
  Administered 2016-11-22 – 2016-11-23 (×2): 2.5 mg via ORAL
  Filled 2016-11-22 (×2): qty 1

## 2016-11-22 MED ORDER — SPIRONOLACTONE 25 MG PO TABS
12.5000 mg | ORAL_TABLET | Freq: Every day | ORAL | Status: DC
  Administered 2016-11-22 – 2016-11-23 (×2): 12.5 mg via ORAL
  Filled 2016-11-22 (×3): qty 1

## 2016-11-22 NOTE — Progress Notes (Signed)
Patient ID: Danielle Vargas, female   DOB: 1951/04/26, 66 y.o.   MRN: [id number]   SUBJECTIVE: No complaints this morning.  No chest pain or dyspnea.  At baseline, she is wheelchair-bound.  She got IV Lasix yesterday but I/Os not recorded.  Creatinine up mildly to 1.47.   Scheduled Meds: . aspirin EC  81 mg Oral Daily  . carvedilol  3.125 mg Oral BID WC  . docusate sodium  100 mg Oral BID  . fenofibrate  160 mg Oral QHS  . FLUoxetine  10 mg Oral Daily  . fluticasone  2 spray Each Nare Daily  . gabapentin  300 mg Oral TID  . heparin  5,000 Units Subcutaneous Q8H  . insulin aspart  0-15 Units Subcutaneous TID WC  . insulin glargine  20 Units Subcutaneous BID  . isosorbide mononitrate  30 mg Oral Daily  . levothyroxine  100 mcg Oral QAC breakfast  . lisinopril  2.5 mg Oral Daily  . mouth rinse  15 mL Mouth Rinse BID  . metoCLOPramide  10 mg Oral QID  . mometasone-formoterol  2 puff Inhalation BID  . montelukast  10 mg Oral QHS  . pantoprazole  40 mg Oral Daily  . polyethylene glycol  17 g Oral QODAY  . rosuvastatin  20 mg Oral QHS  . sodium chloride flush  3 mL Intravenous Q12H  . spironolactone  12.5 mg Oral Daily  . ticagrelor  90 mg Oral BID  . tiZANidine  2 mg Oral TID  . topiramate  50 mg Oral BID   Continuous Infusions: . sodium chloride Stopped (11/20/16 1200)   PRN Meds:.sodium chloride, acetaminophen, albuterol, HYDROcodone-acetaminophen, nitroGLYCERIN, ondansetron (ZOFRAN) IV, sodium chloride flush    Vitals:   11/22/16 0421 11/22/16 0749 11/22/16 0921 11/22/16 0949  BP: 132/64  127/61   Pulse: 84  92   Resp: 18     Temp: 98.9 F (37.2 C) 98.7 F (37.1 C)    TempSrc: Axillary Axillary    SpO2: 99%   97%  Weight: 199 lb 8 oz (90.5 kg)     Height:        Intake/Output Summary (Last 24 hours) at 11/22/16 1018 Last data filed at 11/21/16 1200  Gross per 24 hour  Intake              240 ml  Output                0 ml  Net              240 ml     LABS: Basic Metabolic Panel:  Recent Labs  11/21/16 0817 11/22/16 0247  NA 139 137  K 4.3 3.8  CL 102 104  CO2 21* 24  GLUCOSE 182* 248*  BUN 31* 38*  CREATININE 1.33* 1.47*  CALCIUM 9.1 8.9   Liver Function Tests: No results for input(s): AST, ALT, ALKPHOS, BILITOT, PROT, ALBUMIN in the last 72 hours. No results for input(s): LIPASE, AMYLASE in the last 72 hours. CBC:  Recent Labs  11/19/16 1028 11/20/16 0331  WBC 14.2* 12.7*  HGB 14.5 13.5  HCT 44.3 41.3  MCV 91.9 92.2  PLT 350 299   Cardiac Enzymes:  Recent Labs  11/21/16 0817 11/21/16 1340 11/21/16 2002  TROPONINI 10.08* 11.12* 11.40*   BNP: Invalid input(s): POCBNP D-Dimer: No results for input(s): DDIMER in the last 72 hours. Hemoglobin A1C:  Recent Labs  11/19/16 1028  HGBA1C 6.6*   Fasting Lipid Panel:  Recent Labs  11/20/16 0331  CHOL 117  HDL 23*  LDLCALC 49  TRIG 225*  CHOLHDL 5.1   Thyroid Function Tests: No results for input(s): TSH, T4TOTAL, T3FREE, THYROIDAB in the last 72 hours.  Invalid input(s): FREET3 Anemia Panel: No results for input(s): VITAMINB12, FOLATE, FERRITIN, TIBC, IRON, RETICCTPCT in the last 72 hours.  RADIOLOGY: Dg Chest Portable 1 View  Result Date: 11/19/2016 CLINICAL DATA:  Cough and chest pain EXAM: PORTABLE CHEST 1 VIEW COMPARISON:  March 10, 2016 FINDINGS: There is mild atelectatic change in the left base. Lungs elsewhere clear. Heart is borderline prominent with pulmonary vascularity within normal limits. No adenopathy. There is a loop recorder just to the right of midline. There is postoperative change in the lower cervical spine. There is evidence of old trauma involving the lateral left clavicle. No pneumothorax. IMPRESSION: Mild atelectatic change in the left base. No edema or consolidation. Heart borderline prominent, stable. Electronically Signed   By: Lowella Grip III M.D.   On: 11/19/2016 07:30    PHYSICAL EXAM General: NAD Neck:  Thick, JVP difficult but does not appear elevated, no thyromegaly or thyroid nodule.  Lungs: Clear to auscultation bilaterally with normal respiratory effort. CV: Nondisplaced PMI.  Heart regular S1/S2, no S3/S4, no murmur.  No peripheral edema.   Abdomen: Soft, nontender, no hepatosplenomegaly, no distention.  Neurologic: Alert and oriented x 3.  Left-sided weakness.  Psych: Normal affect. Extremities: No clubbing or cyanosis.   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   1. CAD: Patient had prior occlusion of OM1 with PTCA in 2015.  There was concern for spontaneous coronary artery dissection.  Patient was admitted this time with inferolateral STEMI and had PTCA of distal LCx.  No further chest pain.  - Continue ASA 81, ticagrelor, and statin.   2. Acute systolic CHF: Echo with EF 20-25% with apical ballooning pattern of wall motion.  Wall motion abnormalities are out of proportion to degree of coronary disease.  Suspicious for a Takotsubo-type cardiomyopathy, possible triggered by the stress of her inferolateral MI.  She was diuresed yesterday, creatinine up to 1.47.  Volume status somewhat difficult with thick neck but looks ok to me.  - Stop IV Lasix.  - Continue low dose Coreg.  - Add lisinopril 2.5 daily and spironolactone 12.5 daily, will need to follow K and creatinine closely.  3. AKI: Mild in setting of contrast and diuresis.  Will add low dose lisinopril and spironolactone today, holding Lasix.  Follow creatinine.  4. Prior CVA: Wheelchair-bound due to left-sided weakness.   To telemetry today, possible back to SNF on Monday.   Loralie Champagne 11/22/2016 10:24 AM

## 2016-11-23 LAB — GLUCOSE, CAPILLARY
GLUCOSE-CAPILLARY: 243 mg/dL — AB (ref 65–99)
GLUCOSE-CAPILLARY: 305 mg/dL — AB (ref 65–99)
GLUCOSE-CAPILLARY: 204 mg/dL — AB (ref 65–99)
Glucose-Capillary: 194 mg/dL — ABNORMAL HIGH (ref 65–99)

## 2016-11-23 LAB — CBC
HCT: 34.7 % — ABNORMAL LOW (ref 36.0–46.0)
Hemoglobin: 11.2 g/dL — ABNORMAL LOW (ref 12.0–15.0)
MCH: 30.1 pg (ref 26.0–34.0)
MCHC: 32.3 g/dL (ref 30.0–36.0)
MCV: 93.3 fL (ref 78.0–100.0)
PLATELETS: 253 10*3/uL (ref 150–400)
RBC: 3.72 MIL/uL — ABNORMAL LOW (ref 3.87–5.11)
RDW: 14.8 % (ref 11.5–15.5)
WBC: 8.2 10*3/uL (ref 4.0–10.5)

## 2016-11-23 LAB — BASIC METABOLIC PANEL
Anion gap: 10 (ref 5–15)
BUN: 48 mg/dL — ABNORMAL HIGH (ref 6–20)
CALCIUM: 9.1 mg/dL (ref 8.9–10.3)
CO2: 23 mmol/L (ref 22–32)
CREATININE: 1.86 mg/dL — AB (ref 0.44–1.00)
Chloride: 101 mmol/L (ref 101–111)
GFR, EST AFRICAN AMERICAN: 32 mL/min — AB (ref >60)
GFR, EST NON AFRICAN AMERICAN: 28 mL/min — AB (ref >60)
Glucose, Bld: 299 mg/dL — ABNORMAL HIGH (ref 65–99)
Potassium: 3.8 mmol/L (ref 3.5–5.1)
SODIUM: 134 mmol/L — AB (ref 135–145)

## 2016-11-23 NOTE — Progress Notes (Signed)
Patient ID: Danielle Vargas, female   DOB: [birth year], 66 y.o.   MRN: [id number]   SUBJECTIVE: No complaints this morning.  No chest pain or dyspnea.  At baseline, she is wheelchair-bound.    Creatinine up to 1.86 today.  Got lisinopril yesterday but no Lasix (last IV Lasix on 3/17).   Scheduled Meds: . aspirin EC  81 mg Oral Daily  . carvedilol  3.125 mg Oral BID WC  . docusate sodium  100 mg Oral BID  . fenofibrate  160 mg Oral QHS  . FLUoxetine  10 mg Oral Daily  . fluticasone  2 spray Each Nare Daily  . gabapentin  300 mg Oral TID  . heparin  5,000 Units Subcutaneous Q8H  . insulin aspart  0-15 Units Subcutaneous TID WC  . insulin glargine  20 Units Subcutaneous BID  . isosorbide mononitrate  30 mg Oral Daily  . levothyroxine  100 mcg Oral QAC breakfast  . mouth rinse  15 mL Mouth Rinse BID  . metoCLOPramide  10 mg Oral QID  . mometasone-formoterol  2 puff Inhalation BID  . montelukast  10 mg Oral QHS  . pantoprazole  40 mg Oral Daily  . polyethylene glycol  17 g Oral QODAY  . rosuvastatin  20 mg Oral QHS  . sodium chloride flush  3 mL Intravenous Q12H  . spironolactone  12.5 mg Oral Daily  . ticagrelor  90 mg Oral BID  . tiZANidine  2 mg Oral TID  . topiramate  50 mg Oral BID   Continuous Infusions: . sodium chloride Stopped (11/20/16 1200)   PRN Meds:.sodium chloride, acetaminophen, albuterol, HYDROcodone-acetaminophen, nitroGLYCERIN, ondansetron (ZOFRAN) IV, sodium chloride flush    Vitals:   11/23/16 0315 11/23/16 0500 11/23/16 0828 11/23/16 0931  BP: (!) 96/54  (!) 119/58   Pulse: 77  91   Resp: 17  17   Temp: 97.6 F (36.4 C)  97.9 F (36.6 C)   TempSrc: Axillary  Oral   SpO2: 98%  99% 100%  Weight:  197 lb 9.6 oz (89.6 kg)    Height:        Intake/Output Summary (Last 24 hours) at 11/23/16 0955 Last data filed at 11/23/16 0900  Gross per 24 hour  Intake              320 ml  Output                0 ml  Net              320 ml    LABS: Basic  Metabolic Panel:  Recent Labs  11/22/16 0247 11/23/16 0433  NA 137 134*  K 3.8 3.8  CL 104 101  CO2 24 23  GLUCOSE 248* 299*  BUN 38* 48*  CREATININE 1.47* 1.86*  CALCIUM 8.9 9.1   Liver Function Tests: No results for input(s): AST, ALT, ALKPHOS, BILITOT, PROT, ALBUMIN in the last 72 hours. No results for input(s): LIPASE, AMYLASE in the last 72 hours. CBC:  Recent Labs  11/23/16 0433  WBC 8.2  HGB 11.2*  HCT 34.7*  MCV 93.3  PLT 253   Cardiac Enzymes:  Recent Labs  11/21/16 0817 11/21/16 1340 11/21/16 2002  TROPONINI 10.08* 11.12* 11.40*   BNP: Invalid input(s): POCBNP D-Dimer: No results for input(s): DDIMER in the last 72 hours. Hemoglobin A1C: No results for input(s): HGBA1C in the last 72 hours. Fasting Lipid Panel: No results for input(s): CHOL, HDL, LDLCALC, TRIG, CHOLHDL, LDLDIRECT  in the last 72 hours. Thyroid Function Tests: No results for input(s): TSH, T4TOTAL, T3FREE, THYROIDAB in the last 72 hours.  Invalid input(s): FREET3 Anemia Panel: No results for input(s): VITAMINB12, FOLATE, FERRITIN, TIBC, IRON, RETICCTPCT in the last 72 hours.  RADIOLOGY: Dg Chest Portable 1 View  Result Date: 11/19/2016 CLINICAL DATA:  Cough and chest pain EXAM: PORTABLE CHEST 1 VIEW COMPARISON:  March 10, 2016 FINDINGS: There is mild atelectatic change in the left base. Lungs elsewhere clear. Heart is borderline prominent with pulmonary vascularity within normal limits. No adenopathy. There is a loop recorder just to the right of midline. There is postoperative change in the lower cervical spine. There is evidence of old trauma involving the lateral left clavicle. No pneumothorax. IMPRESSION: Mild atelectatic change in the left base. No edema or consolidation. Heart borderline prominent, stable. Electronically Signed   By: Lowella Grip III M.D.   On: 11/19/2016 07:30    PHYSICAL EXAM General: NAD Neck: Thick, JVP difficult but does not appear elevated, no  thyromegaly or thyroid nodule.  Lungs: Clear to auscultation bilaterally with normal respiratory effort. CV: Nondisplaced PMI.  Heart regular S1/S2, no S3/S4.  No murmur noted.  No peripheral edema.   Abdomen: Soft, nontender, no hepatosplenomegaly, no distention.  Neurologic: Alert and oriented x 3.  Left-sided weakness (chronic).   Psych: Normal affect. Extremities: No clubbing or cyanosis.   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   1. CAD: Patient had prior occlusion of OM1 with PTCA in 2015.  There was concern for spontaneous coronary artery dissection.  Patient was admitted this time with inferolateral STEMI and had PTCA of distal LCx.  No further chest pain.  - Continue ASA 81, ticagrelor, and statin.   2. Acute systolic CHF: Echo with EF 20-25% with apical ballooning pattern of wall motion.  Wall motion abnormalities are out of proportion to degree of coronary disease.  Suspicious for a Takotsubo-type cardiomyopathy, possible triggered by the stress of her inferolateral MI.  She was diuresed Friday, creatinine now up to 1.86.  Volume status somewhat difficult with thick neck but does not look volume overloaded to me.  - No diuretic.  - Continue low dose Coreg.  - Can continue spironolactone for now but will stop lisinopril with rise in creatinine.  3. AKI: Mild in setting of contrast and diuresis.  Stopping lisinopril today, no diuretic.  Encourage po intake. Follow BMET in am.  4. Prior CVA: Wheelchair-bound due to left-sided weakness.   To telemetry today, possible back to SNF on Monday.   Loralie Champagne 11/23/2016 9:55 AM

## 2016-11-23 NOTE — Progress Notes (Signed)
Transferred to 2west room11 by bed, stable, report given to RN.Belongings with pt.

## 2016-11-24 DIAGNOSIS — I5021 Acute systolic (congestive) heart failure: Secondary | ICD-10-CM

## 2016-11-24 LAB — BASIC METABOLIC PANEL
ANION GAP: 14 (ref 5–15)
BUN: 47 mg/dL — AB (ref 6–20)
CO2: 22 mmol/L (ref 22–32)
Calcium: 9.2 mg/dL (ref 8.9–10.3)
Chloride: 99 mmol/L — ABNORMAL LOW (ref 101–111)
Creatinine, Ser: 2.06 mg/dL — ABNORMAL HIGH (ref 0.44–1.00)
GFR calc Af Amer: 28 mL/min — ABNORMAL LOW (ref >60)
GFR calc non Af Amer: 24 mL/min — ABNORMAL LOW (ref >60)
Glucose, Bld: 209 mg/dL — ABNORMAL HIGH (ref 65–99)
POTASSIUM: 4.3 mmol/L (ref 3.5–5.1)
Sodium: 135 mmol/L (ref 135–145)

## 2016-11-24 LAB — GLUCOSE, CAPILLARY
GLUCOSE-CAPILLARY: 236 mg/dL — AB (ref 65–99)
Glucose-Capillary: 189 mg/dL — ABNORMAL HIGH (ref 65–99)
Glucose-Capillary: 290 mg/dL — ABNORMAL HIGH (ref 65–99)
Glucose-Capillary: 252 mg/dL — ABNORMAL HIGH (ref 65–99)

## 2016-11-24 LAB — CBC
HEMATOCRIT: 34.9 % — AB (ref 36.0–46.0)
HEMOGLOBIN: 11 g/dL — AB (ref 12.0–15.0)
MCH: 29.6 pg (ref 26.0–34.0)
MCHC: 31.5 g/dL (ref 30.0–36.0)
MCV: 94.1 fL (ref 78.0–100.0)
Platelets: 273 10*3/uL (ref 150–400)
RBC: 3.71 MIL/uL — ABNORMAL LOW (ref 3.87–5.11)
RDW: 14.7 % (ref 11.5–15.5)
WBC: 8.7 10*3/uL (ref 4.0–10.5)

## 2016-11-24 NOTE — Progress Notes (Signed)
Clinical Social Worker spoke with admissions coordinator from [facility]. Admissions coordinator stated the facility is able to take patient back to facility once patient is medically ready. Coordinator stated patient will be placed back in room 705.  Rhea Pink, MSW,  [place]

## 2016-11-24 NOTE — Progress Notes (Signed)
Progress Note  Patient Name: Danielle Vargas Date of Encounter: 11/24/2016  Primary Cardiologist: Tamala Julian  Subjective   No chest pain or dyspnea  Inpatient Medications    Scheduled Meds: . aspirin EC  81 mg Oral Daily  . carvedilol  3.125 mg Oral BID WC  . docusate sodium  100 mg Oral BID  . fenofibrate  160 mg Oral QHS  . FLUoxetine  10 mg Oral Daily  . fluticasone  2 spray Each Nare Daily  . gabapentin  300 mg Oral TID  . heparin  5,000 Units Subcutaneous Q8H  . insulin aspart  0-15 Units Subcutaneous TID WC  . insulin glargine  20 Units Subcutaneous BID  . isosorbide mononitrate  30 mg Oral Daily  . levothyroxine  100 mcg Oral QAC breakfast  . mouth rinse  15 mL Mouth Rinse BID  . metoCLOPramide  10 mg Oral QID  . mometasone-formoterol  2 puff Inhalation BID  . montelukast  10 mg Oral QHS  . pantoprazole  40 mg Oral Daily  . polyethylene glycol  17 g Oral QODAY  . rosuvastatin  20 mg Oral QHS  . sodium chloride flush  3 mL Intravenous Q12H  . spironolactone  12.5 mg Oral Daily  . ticagrelor  90 mg Oral BID  . tiZANidine  2 mg Oral TID  . topiramate  50 mg Oral BID   Continuous Infusions: . sodium chloride Stopped (11/20/16 1200)   PRN Meds: sodium chloride, acetaminophen, albuterol, HYDROcodone-acetaminophen, nitroGLYCERIN, ondansetron (ZOFRAN) IV, sodium chloride flush   Vital Signs    Vitals:   11/24/16 0321 11/24/16 0400 11/24/16 0549 11/24/16 0554  BP: (!) 86/47 (!) 84/54 (!) 113/54   Pulse: 72 75    Resp: 18     Temp: 98.3 F (36.8 C)     TempSrc: Axillary     SpO2: 100%     Weight:    193 lb (87.5 kg)  Height:        Intake/Output Summary (Last 24 hours) at 11/24/16 0852 Last data filed at 11/23/16 1500  Gross per 24 hour  Intake              340 ml  Output                0 ml  Net              340 ml   Filed Weights   11/22/16 0421 11/23/16 0500 11/24/16 0554  Weight: 199 lb 8 oz (90.5 kg) 197 lb 9.6 oz (89.6 kg) 193 lb (87.5 kg)     Telemetry    Sinus- Personally Reviewed  ECG    N/A   Physical Exam   GEN: No acute distress.   Neck: No JVD Cardiac: RRR, no murmurs, rubs, or gallops.  Respiratory: Clear to auscultation bilaterally. GI: Soft, nontender, non-distended  MS: No edema; No deformity. Neuro:  Nonfocal  Psych: Normal affect   Labs    Chemistry Recent Labs Lab 11/19/16 0655  11/22/16 0247 11/23/16 0433 11/24/16 0159  NA 139  < > 137 134* 135  K 4.2  < > 3.8 3.8 4.3  CL 107  < > 104 101 99*  CO2 17*  < > 24 23 22   GLUCOSE 252*  < > 248* 299* 209*  BUN 22*  < > 38* 48* 47*  CREATININE 1.17*  < > 1.47* 1.86* 2.06*  CALCIUM 9.3  < > 8.9 9.1 9.2  PROT 7.5  --   --   --   --  ALBUMIN 3.4*  --   --   --   --   AST 54*  --   --   --   --   ALT 24  --   --   --   --   ALKPHOS 51  --   --   --   --   BILITOT 0.8  --   --   --   --   GFRNONAA 48*  < > 37* 28* 24*  GFRAA 56*  < > 42* 32* 28*  ANIONGAP 15  < > 9 10 14   < > = values in this interval not displayed.   Hematology Recent Labs Lab 11/20/16 0331 11/23/16 0433 11/24/16 0159  WBC 12.7* 8.2 8.7  RBC 4.48 3.72* 3.71*  HGB 13.5 11.2* 11.0*  HCT 41.3 34.7* 34.9*  MCV 92.2 93.3 94.1  MCH 30.1 30.1 29.6  MCHC 32.7 32.3 31.5  RDW 14.5 14.8 14.7  PLT 299 253 273    Cardiac Enzymes Recent Labs Lab 11/19/16 2121 11/21/16 0817 11/21/16 1340 11/21/16 2002  TROPONINI 39.39* 10.08* 11.12* 11.40*   No results for input(s): TROPIPOC in the last 168 hours.   BNP Recent Labs Lab 11/19/16 1028 11/21/16 0817  BNP 275.0* 726.9*     DDimer No results for input(s): DDIMER in the last 168 hours.   Radiology    No results found.  Cardiac Studies   Echo 11/20/16: Left ventricle: The cavity size was normal. Systolic function was   severely reduced with apical ballooning and akinesis of the mid   and apical anterior, inferolateral, inferior, anterior myocardium   as well as akinesis of the apical, mid inferoseptal and    anteroseptal walls. The estimated ejection fraction was in the   range of 20% to 25%. This is consistent with stress   cardiomyopathy. There is akinesis of the mid-apicalanterior,   inferolateral, and inferior myocardium. There is akinesis of the   entire lateral, mid inferoseptal, apical septal, mid anteroseptal   and entire apical myocardium. - Right ventricle: Poorly visualized. Pacer wire or catheter noted   in right ventricle. - Pulmonary arteries: Systolic pressure could not be accurately   estimated.  Patient Profile     66 y.o. female with history of CAD, prior CVA, DM who was admitted 11/19/16 with an inferolateral STEMI, now s/p PTCA of the distal Circumflex  Assessment & Plan    1. CAD/Inferolateral STEMI: Pt admitted 11/19/16 with an inferolateral STEMI. Dr. Burt Knack performed balloon angioplasty of the distal Circumflex artery. There is minor disease in the LAD, left main and RCA. She has LV systolic dysfunction out of proportion to the degree of CAD. No chest pain over the last 24 hours.  -continue ASA and Brilinta with plans for DAPT for one year. She is on statin and beta blocker.   2. Acute systolic CHF/Non-ischemic cardiomyopathy: LVEF=20-25% by echo post cath. Her LV dysfunction is out of proportion to the degree of CAD. This could represent a stress induced cardiomyopathy (Takotsubo).  -Will continue Coreg. No Ace-inh/ARB with renal insufficiency -Volume status ok today.   3. Acute kidney injury: In setting of contrast load during cath and diuresis. Lasix on hold. Will stop aldactone. Repeat BMET in the am.   Signed, Lauree Chandler, MD  11/24/2016, 8:52 AM

## 2016-11-24 NOTE — Clinical Social Work Placement (Signed)
   CLINICAL SOCIAL WORK PLACEMENT  NOTE  Date:  11/24/2016  Patient Details  Name: Danielle Vargas MRN: [id number] Date of Birth: Feb 01, 1951  Clinical Social Work is seeking post-discharge placement for this patient at the [place] level of care (*CSW will initial, date and re-position this form in  chart as items are completed):  No   Patient/family provided with [facility] [facility] Work Department's list of facilities offering this level of care within the geographic area requested by the patient (or if unable, by the patient's family).  Yes   Patient/family informed of their freedom to choose among providers that offer the needed level of care, that participate in Medicare, Medicaid or managed care program needed by the patient, have an available bed and are willing to accept the patient.  Yes   Patient/family informed of [facility]'s ownership interest in [facility] and [facility], as well as of the fact that they are under no obligation to receive care at these facilities.  PASRR submitted to EDS on       PASRR number received on       Existing PASRR number confirmed on       FL2 transmitted to all facilities in geographic area requested by pt/family on       FL2 transmitted to all facilities within larger geographic area on       Patient informed that his/her managed care company has contracts with or will negotiate with certain facilities, including the following:            Patient/family informed of bed offers received.  Patient chooses bed at       Physician recommends and patient chooses bed at      Patient to be transferred to   on  .  Patient to be transferred to facility by       Patient family notified on   of transfer.  Name of family member notified:        PHYSICIAN Please sign FL2     Additional Comment:    _______________________________________________ Wende Neighbors, LCSW 11/24/2016, 11:35 AM

## 2016-11-24 NOTE — Progress Notes (Signed)
Physical Therapy Treatment Patient Details Name: Yenni Carra MRN: [id number] DOB: [birth year] Today's Date: 11/24/2016    History of Present Illness Pt is a 66 y/o female admitted from [facility] secondary to SOB and chest pain, found to have an acute inferolateral STEMI and acute CHF. PMH including but not limited to CAD, DM, hx of CVA with L sided weakness, HTN and hx of STEMI in 2015.    PT Comments    Pt progressing toward goals with reduction in physical assist required with use of NDT facilitation technique during standing. Focused on seated postural control sitting EOB x 15 min, however, pt unable to achieve neutral postural alignment despite multimodal cues but with improved control with facilitation on L side versus R side. Current d/c recommendation remains appropriate when medically ready. PT will continue to follow.    Follow Up Recommendations  SNF;Supervision/Assistance - 24 hour     Equipment Recommendations  None recommended by PT    Recommendations for Other Services       Precautions / Restrictions Precautions Precautions: Fall Restrictions Weight Bearing Restrictions: No    Mobility  Bed Mobility Overal bed mobility: Needs Assistance Bed Mobility: Supine to Sit     Supine to sit: Max assist;HOB elevated;+2 for physical assistance     General bed mobility comments: repeated verbal cues and max encouragement to bring LE over EOB, maxA for trunk upright with use of bed pads to bring hips to EOB  Transfers Overall transfer level: Needs assistance Equipment used: 2 person hand held assist Transfers: Stand Pivot Transfers Sit to Stand: Max assist;+2 physical assistance         General transfer comment: maxA to power up with pt faciltated through NDT technique, verbal cues for steps toward chair and controlled descent to recliner. pt unable to adjust in chair and requires physical assist for lateral trunk flexion with use of bed pads to move  hips  Ambulation/Gait             General Gait Details: limited to steps toward chair   Stairs            Wheelchair Mobility    Modified Rankin (Stroke Patients Only)       Balance Overall balance assessment: Needs assistance Sitting-balance support: Single extremity supported Sitting balance-Leahy Scale: Poor Sitting balance - Comments: pt requires physical assist to remain upright with use of R UE assist, pt with significant L lateral trunk lean and right cervical flexion with multimodal cues. pt unable to maintain proper positioning after being placed in neutral alignment Postural control: Left lateral lean Standing balance support: During functional activity Standing balance-Leahy Scale: Zero Standing balance comment: maxA x 2                    Cognition Arousal/Alertness: Lethargic Behavior During Therapy: Flat affect Overall Cognitive Status: Impaired/Different from baseline Area of Impairment: Following commands;Attention;Problem solving   Current Attention Level: Sustained   Following Commands: Follows one step commands with increased time     Problem Solving: Slow processing;Decreased initiation;Difficulty sequencing;Requires verbal cues;Requires tactile cues General Comments: pt intially lethargic with difficulty staying awake in supine, which resolved upon sitting EOB. Pt required frequent and repeated verbal and tactile cues throughout session secondary to slow processing, sequencing and decreased intiaiton.     Exercises Other Exercises Other Exercises: postural control exercises in seated including facilitation of pt to achieve proper neutral alignment and pt attempting to maintain proper alignment after being placed in  neutral    General Comments        Pertinent Vitals/Pain Pain Assessment: No/denies pain    Home Living                      Prior Function            PT Goals (current goals can now be found in the  care plan section) Acute Rehab PT Goals Patient Stated Goal: none stated Progress towards PT goals: Progressing toward goals    Frequency    Min 2X/week      PT Plan Current plan remains appropriate    Co-evaluation             End of Session Equipment Utilized During Treatment: Gait belt Activity Tolerance: Patient tolerated treatment well Patient left: in chair;with call bell/phone within reach;with chair alarm set Nurse Communication: Mobility status PT Visit Diagnosis: Unsteadiness on feet (R26.81);Other abnormalities of gait and mobility (R26.89);Muscle weakness (generalized) (M62.81);Difficulty in walking, not elsewhere classified (R26.2)     Time: 3276-1470 PT Time Calculation (min) (ACUTE ONLY): 29 min  Charges:  $Therapeutic Activity: 8-22 mins $Neuromuscular Re-education: 8-22 mins                    G Codes:       Tracie Harrier 2016-11-25, 10:12 AM   Tracie Harrier, SPT Acute Rehab SPT [phone]

## 2016-11-24 NOTE — Care Management Important Message (Signed)
Important Message  Patient Details  Name: Danielle Vargas MRN: [id number] Date of Birth: [birth year]   Medicare Important Message Given:  Yes    Nathen May 11/24/2016, 11:51 AM

## 2016-11-24 NOTE — Progress Notes (Signed)
Pt BP 86/47 this am. Recheck is 84/54. Called MD on call for cardiology -Dr. Jeannine Boga. He advised no orders at this time due to pt having been diuresed recently.  Will continue to monitor pt.

## 2016-11-24 NOTE — NC FL2 (Signed)
[place] LEVEL OF CARE SCREENING TOOL     IDENTIFICATION  Patient Name: Danielle Vargas Birthdate: 09-Oct-1950 Sex: female Admission Date (Current Location): 11/19/2016  [facility] and [place] Number:  Herbalist and Address:  [facility] [place] [place]. [facility], [place] [address] [place], [place] [address]      Provider Number: [id number]  Attending Physician Name and Address:  Sherren Mocha, MD  Relative Name and Phone Number:       Current Level of Care: Hospital Recommended Level of Care: [place] Prior Approval Number:    Date Approved/Denied:   PASRR Number: [id number]  Discharge Plan: Home    Current Diagnoses: Patient Active Problem List   Diagnosis Date Noted  . Acute combined systolic and diastolic heart failure ([place]) 11/19/2016  . ST elevation myocardial infarction (STEMI) of inferolateral wall ([place]) 11/19/2016  . Spastic hemiplegia affecting nondominant side ([place]) 08/05/2016  . OSA (obstructive sleep apnea) 07/30/2016  . 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 Left spastic hemiparesis ([place]) 07/15/2016  . Bursitis of right hip 04/01/2016  . DM type 2 with diabetic peripheral neuropathy ([place])   . Vascular headache   . Right middle cerebral artery stroke ([place]) 03/14/2016  . Asthma   . Acute lower UTI   . Benign essential HTN   . HLD (hyperlipidemia)   . S/P rotator cuff repair   . Tachypnea   . AKI (acute kidney injury) ([place])   . Acute right MCA stroke ([place])   . Back pain   . Ischemic stroke ([place]) 03/10/2016  . Stroke (cerebrum) ([place]) 03/10/2016  . Chest pain, rule out acute myocardial infarction 11/10/2015  . Chest pain 11/10/2015  . Obesity   . Hypothyroidism   . 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 UTI (urinary tract  infection) 06/16/2015  . Type 2 diabetes mellitus with complication ([place]) 96/78/9381  . RUQ abdominal pain 04/17/2015  . Hx of colonic polyps 04/17/2015  . Family hx of colon cancer 04/17/2015  . CPAP rhinitis 12/08/2014  . OSA on CPAP 12/08/2014  . 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 Retinopathy due to secondary diabetes mellitus, without macular edema, with moderate nonproliferative retinopathy ([place]) 09/18/2014  . Neuropathy due to secondary diabetes mellitus ([place]) 09/18/2014  . DM (diabetes mellitus) ([place]) 09/18/2014  . Obesity hypoventilation syndrome ([place]) 09/18/2014  . CAD in native artery 01/23/2014  . Morbid obesity ([place]) 01/23/2014  . Renal insufficiency 11/17/2013  . HTN (hypertension) 10/29/2013  . Chronic diastolic heart failure ([place]) 10/29/2013  . 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 LADA (latent autoimmune diabetes in adults), managed as type 1 ([place]) 08/08/2013    Orientation RESPIRATION BLADDER Height & Weight     Self, Time, Situation, Place  Normal Incontinent Weight: 193 lb (87.5 kg) Height:  5\' 1"  (154.9 cm)  BEHAVIORAL SYMPTOMS/MOOD NEUROLOGICAL BOWEL NUTRITION STATUS      Incontinent Diet (Heart Healthy)  AMBULATORY STATUS COMMUNICATION OF NEEDS Skin   Extensive Assist Verbally Normal                       Personal Care Assistance Level of Assistance  Bathing, Feeding, Dressing Bathing Assistance: Limited assistance Feeding assistance: Independent Dressing Assistance: Limited assistance     Functional Limitations Info  Sight, Hearing, Speech Sight Info: Adequate Hearing Info: Adequate Speech  Info: Adequate    SPECIAL CARE FACTORS FREQUENCY  PT (By licensed PT), OT (By licensed OT)     PT Frequency: 5x week OT Frequency: 5x week            Contractures Contractures Info: Not present    Additional Factors Info  Code Status, Allergies Code Status Info: Full Code Allergies Info: INVOKANA CANAGLIFLOZIN, NSAIDS, PENICILLINS            Current Medications (11/24/2016):  This is the current hospital active medication list Current Facility-Administered Medications  Medication Dose Route Frequency Provider Last Rate Last Dose  . 0.9 %  sodium chloride infusion  10-20 mL/hr Intravenous Continuous Merryl Hacker, MD   Stopped at 11/20/16 1200  . 0.9 %  sodium chloride infusion  250 mL Intravenous PRN Sherren Mocha, MD      . acetaminophen (TYLENOL) tablet 650 mg  650 mg Oral Q4H PRN Sherren Mocha, MD   650 mg at 11/24/16 1120  . albuterol (PROVENTIL) (2.5 MG/3ML) 0.083% nebulizer solution 2.5 mg  2.5 mg Nebulization Q6H PRN Sherren Mocha, MD      . aspirin EC tablet 81 mg  81 mg Oral Daily Sherren Mocha, MD   81 mg at 11/24/16 0913  . carvedilol (COREG) tablet 3.125 mg  3.125 mg Oral BID WC Sherren Mocha, MD   3.125 mg at 11/24/16 0912  . docusate sodium (COLACE) capsule 100 mg  100 mg Oral BID Sherren Mocha, MD   100 mg at 11/24/16 0912  . fenofibrate tablet 160 mg  160 mg Oral QHS Sherren Mocha, MD   160 mg at 11/23/16 2305  . FLUoxetine (PROZAC) capsule 10 mg  10 mg Oral Daily Sherren Mocha, MD   10 mg at 11/24/16 0911  . fluticasone (FLONASE) 50 MCG/ACT nasal spray 2 spray  2 spray Each Nare Daily Sherren Mocha, MD   2 spray at 11/23/16 1000  . gabapentin (NEURONTIN) capsule 300 mg  300 mg Oral TID Sherren Mocha, MD   300 mg at 11/24/16 0912  . heparin injection 5,000 Units  5,000 Units Subcutaneous Q8H Sherren Mocha, MD   5,000 Units at 11/24/16 0549  . HYDROcodone-acetaminophen (NORCO) 10-325 MG per tablet 1 tablet  1 tablet Oral Q4H PRN Sherren Mocha, MD   1 tablet at 11/24/16 0953  . insulin aspart (novoLOG) injection 0-15 Units  0-15 Units Subcutaneous TID WC Sherren Mocha, MD   3 Units at 11/24/16 [phone]  . insulin glargine (LANTUS) injection 20 Units  20 Units Subcutaneous BID Lorretta Harp, MD   20 Units at 11/24/16 0913  . isosorbide mononitrate (IMDUR) 24 hr tablet 30 mg  30 mg Oral Daily  Bhavinkumar Bhagat, PA   30 mg at 11/24/16 0912  . levothyroxine (SYNTHROID, LEVOTHROID) tablet 100 mcg  100 mcg Oral QAC breakfast Sherren Mocha, MD   100 mcg at 11/24/16 0912  . MEDLINE mouth rinse  15 mL Mouth Rinse BID Sherren Mocha, MD   15 mL at 11/23/16 1000  . metoCLOPramide (REGLAN) tablet 10 mg  10 mg Oral QID Sherren Mocha, MD   10 mg at 11/24/16 0912  . mometasone-formoterol (DULERA) 200-5 MCG/ACT inhaler 2 puff  2 puff Inhalation BID Sherren Mocha, MD   2 puff at 11/24/16 1045  . montelukast (SINGULAIR) tablet 10 mg  10 mg Oral QHS Sherren Mocha, MD   10 mg at 11/23/16 2306  . nitroGLYCERIN (NITROSTAT) SL tablet 0.4 mg  0.4 mg Sublingual Q5 Min  x 3 PRN Sherren Mocha, MD   0.4 mg at 11/21/16 0725  . ondansetron (ZOFRAN) injection 4 mg  4 mg Intravenous Q6H PRN Sherren Mocha, MD      . pantoprazole (PROTONIX) EC tablet 40 mg  40 mg Oral Daily Sherren Mocha, MD   40 mg at 11/24/16 0913  . polyethylene glycol (MIRALAX / GLYCOLAX) packet 17 g  17 g Oral Hardie Pulley, MD   17 g at 11/20/16 1033  . rosuvastatin (CRESTOR) tablet 20 mg  20 mg Oral QHS Sherren Mocha, MD   20 mg at 11/23/16 2306  . sodium chloride flush (NS) 0.9 % injection 3 mL  3 mL Intravenous Q12H Sherren Mocha, MD   3 mL at 11/24/16 0915  . sodium chloride flush (NS) 0.9 % injection 3 mL  3 mL Intravenous PRN Sherren Mocha, MD      . ticagrelor [facility]) tablet 90 mg  90 mg Oral BID Sherren Mocha, MD   90 mg at 11/24/16 1000  . tiZANidine (ZANAFLEX) tablet 2 mg  2 mg Oral TID Sherren Mocha, MD   2 mg at 11/24/16 0913  . topiramate (TOPAMAX) tablet 50 mg  50 mg Oral BID Sherren Mocha, MD   50 mg at 11/24/16 0911     Discharge Medications: Please see discharge summary for a list of discharge medications.  Relevant Imaging Results:  Relevant Lab Results:   Additional Information SS# [id number]  Wende Neighbors, LCSW

## 2016-11-24 NOTE — Progress Notes (Signed)
BP improved to 113/54.

## 2016-11-24 NOTE — Progress Notes (Signed)
Inpatient Diabetes Program Recommendations  AACE/ADA: New Consensus Statement on Inpatient Glycemic Control (2015)  Target Ranges:  Prepandial:   less than 140 mg/dL      Peak postprandial:   less than 180 mg/dL (1-2 hours)      Critically ill patients:  140 - 180 mg/dL   Lab Results  Component Value Date   GLUCAP 189 (H) 11/24/2016   HGBA1C 6.6 (H) 11/19/2016    Review of Glycemic Control Results for Danielle Vargas, Danielle Vargas (MRN [id number]) as of 11/24/2016 10:51  Ref. Range 11/23/2016 08:20 11/23/2016 11:45 11/23/2016 16:35 11/23/2016 22:54 11/24/2016 06:39  Glucose-Capillary Latest Ref Range: 65 - 99 mg/dL 243 (H) 305 (H) 194 (H) 204 (H) 189 (H)   Diabetes history: DM 2 Outpatient Diabetes medications: Lantus 30 units QAM, 20 units QPM, Novolog 10-25 units TID Current orders for Inpatient glycemic control: Lantus 20 units bid, Novolog Moderate TID  Inpatient Diabetes Program Recommendations:  Noted postprandial CBGS elevated. Please consider adding Novolog 5 units tid meal coverage if eats 50%.  Thank you, Nani Gasser. Adeola Dennen, RN, MSN, CDE Inpatient Glycemic Control Team Team Pager [phone] (8am-5pm) 11/24/2016 10:52 AM

## 2016-11-24 NOTE — Clinical Social Work Note (Signed)
Clinical Social Work Assessment  Patient Details  Name: Danielle Vargas MRN: [id number] Date of Birth: 1951/04/12  Date of referral:  11/24/16               Reason for consult:  Discharge Planning                Permission sought to share information with:  Family Supports Permission granted to share information::  Yes, Verbal Permission Granted  Name::     [name]  Agency::     Relationship::  Engineer, manufacturing systems Information:  [phone]  Housing/Transportation Living arrangements for the past 2 months:  [place] of Information:  Patient Patient Interpreter Needed:  None Criminal Activity/Legal Involvement Pertinent to Current Situation/Hospitalization:  No - Comment as needed Significant Relationships:  Adult Children Lives with:  Roommate Do you feel safe going back to the place where you live?  Yes Need for family participation in patient care:  Yes (Comment)  Care giving concerns:  Patient stated she lives at [facility] and has been living at facility since July 2017  Social Worker assessment / plan:  Holiday representative met patient at bedside to discuss patients needs at discharge. Patient stated she has been living at  [facility] under their long term facility. Patient stated she is agreeable to return back to SNF. Patient stated  facility is aware that she is currently in the hospital.   Employment status:  Retired Forensic scientist:  Medicare PT Recommendations:  [place] / Referral to community resources:     Patient/Family's Response to care:  Patient verbalized appreciation and understanding for CSW role and involvement in care. Patient agreeable with current discharge plan back to SNF  Patient/Family's Understanding of and Emotional Response to Diagnosis, Current Treatment, and Prognosis:  Patient with good understanding of current medical state and limitations around most  recent hospitalization. Emotional Assessment Appearance:  Appears stated age Attitude/Demeanor/Rapport:  Other Affect (typically observed):  Flat Orientation:  Oriented to Self, Oriented to Place, Oriented to  Time, Oriented to Situation Alcohol / Substance use:  Not Applicable Psych involvement (Current and /or in the community):  No (Comment)  Discharge Needs  Concerns to be addressed:  No discharge needs identified Readmission within the last 30 days:  No Current discharge risk:  None Barriers to Discharge:  No Barriers Identified   Wende Neighbors, LCSW 11/24/2016, 11:25 AM

## 2016-11-25 ENCOUNTER — Encounter (HOSPITAL_COMMUNITY): Payer: Self-pay | Admitting: Student

## 2016-11-25 LAB — BASIC METABOLIC PANEL
Anion gap: 12 (ref 5–15)
BUN: 44 mg/dL — AB (ref 6–20)
CALCIUM: 9.4 mg/dL (ref 8.9–10.3)
CO2: 23 mmol/L (ref 22–32)
Chloride: 101 mmol/L (ref 101–111)
Creatinine, Ser: 1.51 mg/dL — ABNORMAL HIGH (ref 0.44–1.00)
GFR calc Af Amer: 41 mL/min — ABNORMAL LOW (ref >60)
GFR calc non Af Amer: 35 mL/min — ABNORMAL LOW (ref >60)
GLUCOSE: 222 mg/dL — AB (ref 65–99)
Potassium: 4.1 mmol/L (ref 3.5–5.1)
Sodium: 136 mmol/L (ref 135–145)

## 2016-11-25 LAB — GLUCOSE, CAPILLARY
Glucose-Capillary: 215 mg/dL — ABNORMAL HIGH (ref 65–99)
Glucose-Capillary: 288 mg/dL — ABNORMAL HIGH (ref 65–99)

## 2016-11-25 MED ORDER — TICAGRELOR 90 MG PO TABS: 90.0000 mg | ORAL_TABLET | Freq: Two times a day (BID) | ORAL | Status: DC

## 2016-11-25 MED ORDER — ASPIRIN 81 MG PO TBEC: 81.0000 mg | DELAYED_RELEASE_TABLET | Freq: Every day | ORAL | Status: AC

## 2016-11-25 MED ORDER — ISOSORBIDE MONONITRATE ER 30 MG PO TB24
30.0000 mg | ORAL_TABLET | Freq: Every day | ORAL | Status: DC
Start: 2016-11-26 — End: 2016-11-29

## 2016-11-25 NOTE — Care Management Note (Signed)
Case Management Note Previous CM note initiated by Maryclare Labrador, RN 11/20/2016, 4:21 PM   Patient Details  Name: Danielle Vargas MRN: [id number] Date of Birth: [birth year]  Subjective/Objective:   Pt admitted with  CP              Action/Plan:  Pt is from [facility] - pt intends on returning to [facility] at discharge.  CSW consulted   Expected Discharge Date:  11/25/16               Expected Discharge Plan:  Skilled Nursing Facility  In-House Referral:  Clinical Social Work  Discharge planning Services  CM Consult  Post Acute Care Choice:  NA Choice offered to:  NA  DME Arranged:    DME Agency:     HH Arranged:    [place] Agency:     Status of Service:  Completed, signed off  If discussed at [employer] of Stay Meetings, dates discussed:  11/25/16  Discharge Disposition: skilled facility   Additional Comments:  11/25/16- 1145- [name] RN, CM- pt stable for d/c back to SNF today- CSW following for return to [facility] today.   Dahlia Client [place], RN 11/25/2016, 11:45 AM [phone]

## 2016-11-25 NOTE — Discharge Instructions (Signed)
PLEASE REMEMBER TO BRING ALL OF YOUR MEDICATIONS TO EACH OF YOUR FOLLOW-UP OFFICE VISITS.  PLEASE ATTEND ALL SCHEDULED FOLLOW-UP APPOINTMENTS.   Activity: Increase activity slowly as tolerated. You may shower, but no soaking baths (or swimming) for 1 week. You may not return to work until cleared by your cardiologist. No lifting over 10 lbs for 4 weeks. No sexual activity for 4 weeks.   Wound Care: You may wash cath site gently with soap and water. Keep cath site clean and dry. If you notice pain, swelling, bleeding or pus at your cath site, please call [phone].

## 2016-11-25 NOTE — Progress Notes (Signed)
CARDIAC REHAB PHASE I   Pt not appropriate for ambulation with cardiac rehab at this time as pt is wheelchair bound. Pt last seen by cardiac rehab in 2015. Completed MI/PCI education.  Reviewed risk factors, MI book, anti-platelet therapy, activity restrictions, ntg, heart healthy diet and diabetes diet, sodium restrictions, s/s heart failure, daily weights and phase 2 cardiac rehab. Pt verbalized understanding. Pt agrees to phase 2 cardiac rehab referral, although it is unlikely pt will be able to participate due to limited mobility, will send referral to [facility]. Pt in bed, call bell within reach.   [phone] Lenna Sciara, RN, BSN 11/25/2016 12:14 PM

## 2016-11-25 NOTE — Progress Notes (Signed)
Progress Note  Patient Name: Danielle Vargas Date of Encounter: 11/25/2016  Primary Cardiologist: Dr. Tamala Julian  Subjective   No chest pain or palpitations. Breathing at baseline. No orthopnea.   Inpatient Medications    Scheduled Meds: . aspirin EC  81 mg Oral Daily  . carvedilol  3.125 mg Oral BID WC  . docusate sodium  100 mg Oral BID  . fenofibrate  160 mg Oral QHS  . FLUoxetine  10 mg Oral Daily  . fluticasone  2 spray Each Nare Daily  . gabapentin  300 mg Oral TID  . heparin  5,000 Units Subcutaneous Q8H  . insulin aspart  0-15 Units Subcutaneous TID WC  . insulin glargine  20 Units Subcutaneous BID  . isosorbide mononitrate  30 mg Oral Daily  . levothyroxine  100 mcg Oral QAC breakfast  . mouth rinse  15 mL Mouth Rinse BID  . metoCLOPramide  10 mg Oral QID  . mometasone-formoterol  2 puff Inhalation BID  . montelukast  10 mg Oral QHS  . pantoprazole  40 mg Oral Daily  . polyethylene glycol  17 g Oral QODAY  . rosuvastatin  20 mg Oral QHS  . sodium chloride flush  3 mL Intravenous Q12H  . ticagrelor  90 mg Oral BID  . tiZANidine  2 mg Oral TID  . topiramate  50 mg Oral BID   Continuous Infusions: . sodium chloride Stopped (11/20/16 1200)   PRN Meds: sodium chloride, acetaminophen, albuterol, HYDROcodone-acetaminophen, nitroGLYCERIN, ondansetron (ZOFRAN) IV, sodium chloride flush   Vital Signs    Vitals:   11/24/16 0554 11/24/16 1300 11/24/16 2057 11/25/16 0428  BP:  97/60 95/62 (!) 146/56  Pulse:  79 74 85  Resp:  20 18 20   Temp:  97.8 F (36.6 C) 97.7 F (36.5 C) 98.3 F (36.8 C)  TempSrc:  Oral Oral Oral  SpO2:  97% 95% 97%  Weight: 193 lb (87.5 kg)   197 lb 1.6 oz (89.4 kg)  Height:        Intake/Output Summary (Last 24 hours) at 11/25/16 0713 Last data filed at 11/24/16 2130  Gross per 24 hour  Intake              570 ml  Output              650 ml  Net              -80 ml   Filed Weights   11/23/16 0500 11/24/16 0554 11/25/16 0428    Weight: 197 lb 9.6 oz (89.6 kg) 193 lb (87.5 kg) 197 lb 1.6 oz (89.4 kg)    Telemetry    NSR, HR in 70's - 80's. No ectopic events.  - Personally Reviewed  ECG    No new tracings.   Physical Exam   General: Chronically-ill appearing Caucasian female, currently in no acute distress. Head: Normocephalic, atraumatic.  Neck: Supple without bruits, JVD not elevated. Lungs:  Resp regular and unlabored, CTA. Heart: RRR, S1, S2, no S3, S4, or murmur; no rub. Abdomen: Soft, non-tender, non-distended with normoactive bowel sounds. No hepatomegaly. No rebound/guarding. No obvious abdominal masses. Extremities: No clubbing, cyanosis, or edema. Distal pedal pulses are 2+ bilaterally. Neuro: Alert and oriented X 3. Moves all extremities spontaneously. Psych: Normal affect.  Labs    Chemistry Recent Labs Lab 11/19/16 0655  11/23/16 0433 11/24/16 0159 11/25/16 0232  NA 139  < > 134* 135 136  K 4.2  < > 3.8  4.3 4.1  CL 107  < > 101 99* 101  CO2 17*  < > 23 22 23   GLUCOSE 252*  < > 299* 209* 222*  BUN 22*  < > 48* 47* 44*  CREATININE 1.17*  < > 1.86* 2.06* 1.51*  CALCIUM 9.3  < > 9.1 9.2 9.4  PROT 7.5  --   --   --   --   ALBUMIN 3.4*  --   --   --   --   AST 54*  --   --   --   --   ALT 24  --   --   --   --   ALKPHOS 51  --   --   --   --   BILITOT 0.8  --   --   --   --   GFRNONAA 48*  < > 28* 24* 35*  GFRAA 56*  < > 32* 28* 41*  ANIONGAP 15  < > 10 14 12   < > = values in this interval not displayed.   Hematology Recent Labs Lab 11/20/16 0331 11/23/16 0433 11/24/16 0159  WBC 12.7* 8.2 8.7  RBC 4.48 3.72* 3.71*  HGB 13.5 11.2* 11.0*  HCT 41.3 34.7* 34.9*  MCV 92.2 93.3 94.1  MCH 30.1 30.1 29.6  MCHC 32.7 32.3 31.5  RDW 14.5 14.8 14.7  PLT 299 253 273    Cardiac Enzymes Recent Labs Lab 11/19/16 2121 11/21/16 0817 11/21/16 1340 11/21/16 2002  TROPONINI 39.39* 10.08* 11.12* 11.40*   No results for input(s): TROPIPOC in the last 168 hours.   BNP Recent  Labs Lab 11/19/16 1028 11/21/16 0817  BNP 275.0* 726.9*     DDimer No results for input(s): DDIMER in the last 168 hours.   Radiology    No results found.  Cardiac Studies   Cardiac Catheterization: 11/19/2016 1. Acute inferolateral STEMI secondary to thrombotic occlusion of the distal left circumflex, treated successfully with balloon angioplasty 2. Acute systolic heart failure with markedly elevated LVEDP, improved with IV nitroglycerin, IV Lasix, and BiPAP 3. Minor nonobstructive disease in the RCA, left main, and LAD 4. Severe LV systolic dysfunction with hyperkinetic basal segments and dyskinesis of the anterolateral, periapical, and infero-apical walls, LVEF estimated at 25-30%  Recommendations:  Aspirin and brilinta (dual antiplatelet therapy) 12 months  Continue BiPAP and IV nitroglycerin. Wean BiPAP as tolerated  Start low-dose beta blocker as tolerated  ICU admission  Echocardiogram: 11/20/2016 Study Conclusions  - Left ventricle: The cavity size was normal. Systolic function was   severely reduced with apical ballooning and akinesis of the mid   and apical anterior, inferolateral, inferior, anterior myocardium   as well as akinesis of the apical, mid inferoseptal and   anteroseptal walls. The estimated ejection fraction was in the   range of 20% to 25%. This is consistent with stress   cardiomyopathy. There is akinesis of the mid-apicalanterior,   inferolateral, and inferior myocardium. There is akinesis of the   entire lateral, mid inferoseptal, apical septal, mid anteroseptal   and entire apical myocardium. - Right ventricle: Poorly visualized. Pacer wire or catheter noted   in right ventricle. - Pulmonary arteries: Systolic pressure could not be accurately   estimated.  Patient Profile     66 y.o. female w/ PMH of CAD, prior CVA, and Type 2 DM who was admitted 11/19/16 with an inferolateral STEMI, now s/p PTCA of the distal Circumflex.  Assessment &  Plan    1.  CAD/Inferolateral STEMI - Pt admitted 11/19/16 with an inferolateral STEMI. Dr. Burt Knack performed balloon angioplasty of the distal Circumflex artery. There is minor disease in the LAD, left main and RCA. She has LV systolic dysfunction out of proportion to the degree of CAD. No chest pain over the last 24 hours.  -continue ASA and Brilinta with plans for DAPT for one year. Continue statin and beta blocker therapy.   2. Acute systolic CHF/Non-ischemic cardiomyopathy - LVEF at 20-25% by echo post cath. Her LV dysfunction is out of proportion to the degree of CAD. This could represent a stress induced cardiomyopathy ([place]).  - Will continue Coreg. No ACE-I/ARB/ARNI with renal insufficiency.  3. Acute kidney injury - In setting of contrast load during cath and diuresis. Lasix on hold. Spironolactone and Lisinopril have been held. - creatinine peaked at 2.06 on 3/19, improved to 1.51 this AM.   4. Prior CVA - residual left-sided weakness, wheel-chair bound.   Likely stable for discharge later today back to SNF. Will need TOC appointment and BMET in one week. If creatinine improved, will need to consider resuming ACE-I and Spironolactone at that time.   Signed, Danielle Vargas , PA-C 7:13 AM 11/25/2016 Pager: [phone]  I have personally seen and examined this patient with Bernerd Pho, PA-C. I agree with the assessment and plan as outlined above. She is doing well this am. Renal function is improving. Continue DAPT with ASA and Brilinta. Will continue Coreg. Consider starting Ace-inh or ARB when renal function back to baseline given her mixed cardiomyopathy. Discharge back to SNF today. Follow up dr. Tamala Julian or office APP one week.   Lauree Chandler 11/25/2016 8:40 AM

## 2016-11-25 NOTE — Discharge Summary (Signed)
Discharge Summary    Patient ID: Danielle Vargas,  MRN: [id number], DOB/AGE: [birth year] 66 y.o.  Admit date: 11/19/2016 Discharge date: 11/25/2016  Primary Care Provider: Marton Redwood Primary Cardiologist: Dr. Tamala Julian  Discharge Diagnoses    Active Problems:   Acute combined systolic and diastolic heart failure ([place])   ST elevation myocardial infarction (STEMI) of inferolateral wall ([place])   History of Present Illness     Danielle Vargas is a 66 y.o. 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   Awoke earlier the morning of presentation with substernal chest pressure and dyspnea. Denied any additional symptoms. She was given SL NTG during transport to the hospital with resolution of her symptoms.   Her initial EKG showed inferolateral ST elevation, therefore she was taken to the cath lab for an emergent cardiac catheterization. She was given IV Lasix prior to the procedure to assist with her respiratory status.    Hospital Course     Consultants: None  Her cardiac catheterization showed a thrombotic occlusion of the distal LCx which was treated with balloon angioplasty. Minor nonobstructive disease was noted along the RCA, LM, and LAD. EF was reduced at 25-30%. She was started on ASA and Brilinta and will remain on this for at least 12 months.   She initially required BiPAP following the procedure but was changed over to nasal cannula the following day. Continued to diurese well with IV Lasix. Troponin values peaked at 51.68. Echo was performed on 3/15 and showed an EF of 20-25% with severely reduced with apical ballooning and akinesis of the mid and apical anterior, inferolateral, inferior, anterior myocardium as well as akinesis of the apical, mid inferoseptal and anteroseptal  walls.  She continued to diurese over the next several days with IV Lasix. As she was diuresed, creatinine trended upwards. Creatinine peaked at 2.06 on 3/19, but improved to 1.51 the following day. Lisinopril 2.5mg  daily and Spironolactone 12.5mg  daily had been added on 3/17 due to her reduced EF but were later discontinued secondary to her AKI and hypotension. She will continue on Coreg 3.125mg  BID and Imdur 30mg  daily along with her ASA and Brilinta. Will restart low-dose PTA Lasix 10mg  daily.   She was last examined by Dr. Angelena Form and deemed stable for discharge back to SNF. 7-day Cardiology follow-up has been arranged. She will need a repeat BMET at that time and if creatinine is back to baseline, would recommend initiation of an ACE-I or ARB at that time. She was discharged back to SNF in stable condition.   _____________  Discharge Vitals Blood pressure (!) 146/56, pulse 85, temperature 98.3 F (36.8 C), temperature source Oral, resp. rate 20, height 5\' 1"  (1.549 m), weight 197 lb 1.6 oz (89.4 kg), SpO2 98 %.  Filed Weights   11/23/16 0500 11/24/16 0554 11/25/16 0428  Weight: 197 lb 9.6 oz (89.6 kg) 193 lb (87.5 kg) 197 lb 1.6 oz (89.4 kg)    Labs & Radiologic Studies     CBC  Recent Labs  11/23/16 0433 11/24/16 0159  WBC 8.2 8.7  HGB 11.2* 11.0*  HCT 34.7* 34.9*  MCV 93.3 94.1  PLT 253 237   Basic Metabolic Panel  Recent Labs  11/24/16 0159 11/25/16 0232  NA 135 136  K 4.3 4.1  CL 99* 101  CO2 22 23  GLUCOSE 209* 222*  BUN 47* 44*  CREATININE 2.06* 1.51*  CALCIUM 9.2 9.4   Liver Function Tests No results for input(s): AST, ALT, ALKPHOS, BILITOT, PROT, ALBUMIN in the last 72 hours. No results for input(s): LIPASE, AMYLASE in the last 72 hours. Cardiac Enzymes No results for input(s): CKTOTAL, CKMB, CKMBINDEX, TROPONINI in the last 72 hours. BNP Invalid input(s): POCBNP D-Dimer No results for input(s): DDIMER in the last 72 hours. Hemoglobin A1C No  results for input(s): HGBA1C in the last 72 hours. Fasting Lipid Panel No results for input(s): CHOL, HDL, LDLCALC, TRIG, CHOLHDL, LDLDIRECT in the last 72 hours. Thyroid Function Tests No results for input(s): TSH, T4TOTAL, T3FREE, THYROIDAB in the last 72 hours.  Invalid input(s): FREET3  Dg Chest Portable 1 View  Result Date: 11/19/2016 CLINICAL DATA:  Cough and chest pain EXAM: PORTABLE CHEST 1 VIEW COMPARISON:  March 10, 2016 FINDINGS: There is mild atelectatic change in the left base. Lungs elsewhere clear. Heart is borderline prominent with pulmonary vascularity within normal limits. No adenopathy. There is a loop recorder just to the right of midline. There is postoperative change in the lower cervical spine. There is evidence of old trauma involving the lateral left clavicle. No pneumothorax. IMPRESSION: Mild atelectatic change in the left base. No edema or consolidation. Heart borderline prominent, stable. Electronically Signed   By: Lowella Grip III M.D.   On: 11/19/2016 07:30     Diagnostic Studies/Procedures    Cardiac Catheterization: 11/19/2016 1. Acute inferolateral STEMI secondary to thrombotic occlusion of the distal left circumflex, treated successfully with balloon angioplasty 2. Acute systolic heart failure with markedly elevated LVEDP, improved with IV nitroglycerin, IV Lasix, and BiPAP 3. Minor nonobstructive disease in the RCA, left main, and LAD 4. Severe LV systolic dysfunction with hyperkinetic basal segments and dyskinesis of the anterolateral, periapical, and infero-apical walls, LVEF estimated at 25-30%  Recommendations:  Aspirin and brilinta (dual antiplatelet therapy) 12 months  Continue BiPAP and IV nitroglycerin. Wean BiPAP as tolerated  Start low-dose beta blocker as tolerated  ICU admission  Echocardiogram: 11/20/2016 Study Conclusions  - Left ventricle: The cavity size was normal. Systolic function was severely reduced with apical  ballooning and akinesis of the mid and apical anterior, inferolateral, inferior, anterior myocardium as well as akinesis of the apical, mid inferoseptal and anteroseptal walls. The estimated ejection fraction was in the range of 20% to 25%. This is consistent with stress cardiomyopathy. There is akinesis of the mid-apicalanterior, inferolateral, and inferior myocardium. There is akinesis of the entire lateral, mid inferoseptal, apical septal, mid anteroseptal and entire apical myocardium. - Right ventricle: Poorly visualized. Pacer wire or catheter noted in right ventricle. - Pulmonary arteries: Systolic pressure could not be accurately estimated.  Disposition   Pt is being discharged home today in good condition.  Follow-up Plans & Appointments    Follow-up Information    Truitt Merle, NP Follow up on 12/03/2016.   Specialties:  Nurse Practitioner, Interventional Cardiology, Cardiology, Radiology Why:  [place] on 12/03/2016 at 11:00AM Contact information: [place]. [address] [place] [address] [phone]          Discharge Instructions    Diet - low sodium heart healthy    Complete by:  As directed       Discharge Medications     Medication List    STOP taking these medications   clopidogrel 75 MG tablet  Commonly known as:  PLAVIX   tolnaftate 1 % cream Commonly known as:  TINACTIN     TAKE these medications   acetaminophen 325 MG tablet Commonly known as:  TYLENOL Take 2 tablets (650 mg total) by mouth every 6 (six) hours as needed for mild pain (or temp >/= 99.5 F).   albuterol 108 (90 Base) MCG/ACT inhaler Commonly known as:  PROVENTIL HFA;VENTOLIN HFA Inhale 2 puffs into the lungs every 6 (six) hours as needed for wheezing or shortness of breath.   aspirin 81 MG EC tablet Take 1 tablet (81 mg total) by mouth daily. Start taking on:  11/26/2016   carvedilol 3.125 MG tablet Commonly known as:   COREG Take 1 tablet by mouth 2 (two) times daily.   docusate sodium 100 MG capsule Commonly known as:  COLACE Take 1 capsule (100 mg total) by mouth 2 (two) times daily.   fenofibrate 160 MG tablet Take 160 mg by mouth at bedtime.   FLUoxetine 10 MG capsule Commonly known as:  PROZAC Take 1 capsule (10 mg total) by mouth daily.   furosemide 20 MG tablet Commonly known as:  LASIX Take 0.5 tablets (10 mg total) by mouth daily.   gabapentin 300 MG capsule Commonly known as:  NEURONTIN Take 300 mg by mouth 3 (three) times daily.   HUMALOG KWIKPEN 100 UNIT/ML KiwkPen Generic drug:  insulin lispro Inject 30 Units into the skin 2 (two) times daily. And before breakfast and dinner   HYDROcodone-acetaminophen 10-325 MG tablet Commonly known as:  NORCO 1 tablet every 6 (six) hours as needed.   insulin aspart 100 UNIT/ML injection Commonly known as:  novoLOG Inject 10-25 Units into the skin 3 (three) times daily with meals.   insulin glargine 100 UNIT/ML injection Commonly known as:  LANTUS Inject 30 units in am and 20 units at bedtime. What changed:  how to take this  additional instructions   isosorbide mononitrate 30 MG 24 hr tablet Commonly known as:  IMDUR Take 1 tablet (30 mg total) by mouth daily. Start taking on:  11/26/2016   levothyroxine 100 MCG tablet Commonly known as:  SYNTHROID, LEVOTHROID Take 1 tablet (100 mcg total) by mouth daily before breakfast.   metoCLOPramide 10 MG tablet Commonly known as:  REGLAN Take 10 mg by mouth 4 (four) times daily.   mometasone 50 MCG/ACT nasal spray Commonly known as:  NASONEX Place 2 sprays into the nose daily.   mometasone-formoterol 200-5 MCG/ACT Aero Commonly known as:  DULERA Inhale 2 puffs into the lungs 2 (two) times daily.   montelukast 10 MG tablet Commonly known as:  SINGULAIR Take 10 mg by mouth at bedtime.   multivitamin with minerals Tabs tablet Take 1 tablet by mouth daily. One A Day 50+     MUSCLE RUB 10-15 % Crea Apply 1 application topically 2 (two) times daily as needed for muscle pain. For right hip pain   nitroGLYCERIN 0.4 MG SL tablet Commonly known as:  NITROSTAT Place 1 tablet (0.4 mg total) under the tongue every 5 (five) minutes as needed for chest pain.   pantoprazole 40 MG tablet Commonly known as:  PROTONIX Take 1 tablet (40 mg total) by mouth daily.   polyethylene glycol packet Commonly known as:  MIRALAX / GLYCOLAX Take 17 g by mouth every other day.   rosuvastatin 20 MG tablet Commonly known as:  CRESTOR Take 1 tablet (20 mg total) by mouth daily. What changed:  when to take this  ticagrelor 90 MG Tabs tablet Commonly known as:  BRILINTA Take 1 tablet (90 mg total) by mouth 2 (two) times daily.   tiZANidine 2 MG tablet Commonly known as:  ZANAFLEX Take 2 mg by mouth 3 (three) times daily.   topiramate 50 MG tablet Commonly known as:  TOPAMAX Take 50 mg by mouth 2 (two) times daily.       Aspirin prescribed at discharge?  Yes High Intensity Statin Prescribed? (Lipitor 40-80mg  or Crestor 20-40mg ): Yes Beta Blocker Prescribed? Yes For EF 40% or less, Was ACEI/ARB Prescribed? No: AKI ADP Receptor Inhibitor Prescribed? (i.e. Plavix etc.-Includes Medically Managed Patients): Yes For EF <40%, Aldosterone Inhibitor Prescribed? No: AKI Was EF assessed during THIS hospitalization? Yes Was Cardiac Rehab II ordered? (Included Medically managed Patients): No: Wheel-chair bound following CVA.    Allergies Allergies  Allergen Reactions  . Invokana [Canagliflozin] Diarrhea  . Nsaids Other (See Comments)    GI problems/ stomach pain  . Penicillins Rash    Has patient had a PCN reaction causing immediate rash, facial/tongue/throat swelling, SOB or lightheadedness with hypotension: No (rash on legs after 3 days [place] -June 2016) Has patient had a PCN reaction causing severe rash involving mucus membranes or skin necrosis: No Has patient had a PCN  reaction that required hospitalization No Has patient had a PCN reaction occurring within the last 10 years: Yes If all of the above answers are "NO", then may proceed with Cephalosporin use.     Outstanding Labs/Studies   BMET at follow-up appointment.   Duration of Discharge Encounter   Greater than 30 minutes including physician time.  Signed, Erma Heritage, PA-C 11/25/2016, 11:11 AM  See my full note this am. cdm

## 2016-11-25 NOTE — Progress Notes (Signed)
Report called to [facility]. Iv removed. Transport notified. Will continue to monitor.  Kailo Kosik, Mervin Kung RN

## 2016-11-25 NOTE — Progress Notes (Signed)
Clinical Social Worker facilitated patient discharge including contacting patient family and facility to confirm patient discharge plans.  Clinical information faxed to facility and family agreeable with plan.  CSW arranged ambulance transport via [facility] to [place] to call [phone] and ask for the nurse that has room 705A for  report prior to discharge.  Clinical Social Worker will sign off for now as social work intervention is no longer needed. Please consult [place] again if new need arises.  Rhea Pink, MSW, [place]

## 2016-11-26 LAB — CUP PACEART REMOTE DEVICE CHECK
Date Time Interrogation Session: 20180302154120
Implantable Pulse Generator Implant Date: 20170705

## 2016-11-26 NOTE — Consult Note (Signed)
           [facility] CM Primary Care Navigator  11/26/2016  Danielle Vargas [birth year] [id number]   Went to see patient at the bedside to identify possible discharge needs but staff reports that patient was discharged yesterday afternoon.  Chart review reveals the patient is from [facility] skilled nursing facility and returned back to [facility] ([facility]) at discharge.    For questions, please contact:  Dannielle Huh, BSN, RN- [facility] Primary Care Navigator  Telephone: [phone] [place]

## 2016-11-28 ENCOUNTER — Encounter (HOSPITAL_COMMUNITY): Payer: Self-pay | Admitting: Emergency Medicine

## 2016-11-28 ENCOUNTER — Observation Stay (HOSPITAL_COMMUNITY)
Admission: EM | Admit: 2016-11-28 | Discharge: 2016-11-29 | Disposition: A | Discharge status: Skilled Nursing Facility | Payer: No Typology Code available for payment source | Attending: Internal Medicine | Admitting: Internal Medicine

## 2016-11-28 ENCOUNTER — Emergency Department (HOSPITAL_COMMUNITY): Payer: No Typology Code available for payment source

## 2016-11-28 DIAGNOSIS — Z7902 Long term (current) use of antithrombotics/antiplatelets: Secondary | ICD-10-CM | POA: Insufficient documentation

## 2016-11-28 DIAGNOSIS — I252 Old myocardial infarction: Secondary | ICD-10-CM | POA: Insufficient documentation

## 2016-11-28 DIAGNOSIS — G90512 Complex regional pain syndrome I of left upper limb: Secondary | ICD-10-CM | POA: Insufficient documentation

## 2016-11-28 DIAGNOSIS — Z6837 Body mass index (BMI) 37.0-37.9, adult: Secondary | ICD-10-CM | POA: Insufficient documentation

## 2016-11-28 DIAGNOSIS — E785 Hyperlipidemia, unspecified: Secondary | ICD-10-CM | POA: Insufficient documentation

## 2016-11-28 DIAGNOSIS — I10 Essential (primary) hypertension: Secondary | ICD-10-CM | POA: Diagnosis present

## 2016-11-28 DIAGNOSIS — I63511 Cerebral infarction due to unspecified occlusion or stenosis of right middle cerebral artery: Secondary | ICD-10-CM | POA: Diagnosis present

## 2016-11-28 DIAGNOSIS — F329 Major depressive disorder, single episode, unspecified: Secondary | ICD-10-CM | POA: Insufficient documentation

## 2016-11-28 DIAGNOSIS — Z9861 Coronary angioplasty status: Secondary | ICD-10-CM | POA: Insufficient documentation

## 2016-11-28 DIAGNOSIS — I2510 Atherosclerotic heart disease of native coronary artery without angina pectoris: Secondary | ICD-10-CM | POA: Diagnosis not present

## 2016-11-28 DIAGNOSIS — J45909 Unspecified asthma, uncomplicated: Secondary | ICD-10-CM | POA: Insufficient documentation

## 2016-11-28 DIAGNOSIS — I959 Hypotension, unspecified: Secondary | ICD-10-CM | POA: Diagnosis not present

## 2016-11-28 DIAGNOSIS — K3184 Gastroparesis: Secondary | ICD-10-CM | POA: Diagnosis not present

## 2016-11-28 DIAGNOSIS — Z794 Long term (current) use of insulin: Secondary | ICD-10-CM | POA: Diagnosis not present

## 2016-11-28 DIAGNOSIS — E1159 Type 2 diabetes mellitus with other circulatory complications: Secondary | ICD-10-CM | POA: Diagnosis not present

## 2016-11-28 DIAGNOSIS — E1143 Type 2 diabetes mellitus with diabetic autonomic (poly)neuropathy: Secondary | ICD-10-CM | POA: Insufficient documentation

## 2016-11-28 DIAGNOSIS — R55 Syncope and collapse: Secondary | ICD-10-CM | POA: Diagnosis not present

## 2016-11-28 DIAGNOSIS — E039 Hypothyroidism, unspecified: Secondary | ICD-10-CM | POA: Diagnosis present

## 2016-11-28 DIAGNOSIS — I69354 Hemiplegia and hemiparesis following cerebral infarction affecting left non-dominant side: Secondary | ICD-10-CM | POA: Insufficient documentation

## 2016-11-28 DIAGNOSIS — Z880 Allergy status to penicillin: Secondary | ICD-10-CM | POA: Insufficient documentation

## 2016-11-28 DIAGNOSIS — Z7982 Long term (current) use of aspirin: Secondary | ICD-10-CM | POA: Insufficient documentation

## 2016-11-28 DIAGNOSIS — Z886 Allergy status to analgesic agent status: Secondary | ICD-10-CM | POA: Insufficient documentation

## 2016-11-28 DIAGNOSIS — Z888 Allergy status to other drugs, medicaments and biological substances status: Secondary | ICD-10-CM | POA: Insufficient documentation

## 2016-11-28 DIAGNOSIS — Z79899 Other long term (current) drug therapy: Secondary | ICD-10-CM | POA: Insufficient documentation

## 2016-11-28 DIAGNOSIS — G4733 Obstructive sleep apnea (adult) (pediatric): Secondary | ICD-10-CM | POA: Insufficient documentation

## 2016-11-28 DIAGNOSIS — E11319 Type 2 diabetes mellitus with unspecified diabetic retinopathy without macular edema: Secondary | ICD-10-CM | POA: Insufficient documentation

## 2016-11-28 DIAGNOSIS — Z86010 Personal history of colonic polyps: Secondary | ICD-10-CM

## 2016-11-28 DIAGNOSIS — E1142 Type 2 diabetes mellitus with diabetic polyneuropathy: Secondary | ICD-10-CM | POA: Diagnosis not present

## 2016-11-28 DIAGNOSIS — G4710 Hypersomnia, unspecified: Secondary | ICD-10-CM | POA: Diagnosis not present

## 2016-11-28 DIAGNOSIS — I110 Hypertensive heart disease with heart failure: Secondary | ICD-10-CM | POA: Diagnosis not present

## 2016-11-28 DIAGNOSIS — G4737 Central sleep apnea in conditions classified elsewhere: Secondary | ICD-10-CM | POA: Diagnosis not present

## 2016-11-28 DIAGNOSIS — I5042 Chronic combined systolic (congestive) and diastolic (congestive) heart failure: Secondary | ICD-10-CM | POA: Diagnosis not present

## 2016-11-28 DIAGNOSIS — E119 Type 2 diabetes mellitus without complications: Secondary | ICD-10-CM

## 2016-11-28 LAB — COMPREHENSIVE METABOLIC PANEL
ALBUMIN: 2.8 g/dL — AB (ref 3.5–5.0)
ALK PHOS: 43 U/L (ref 38–126)
ALT: 22 U/L (ref 14–54)
ANION GAP: 10 (ref 5–15)
AST: 37 U/L (ref 15–41)
BILIRUBIN TOTAL: 0.8 mg/dL (ref 0.3–1.2)
BUN: 32 mg/dL — AB (ref 6–20)
CO2: 20 mmol/L — AB (ref 22–32)
Calcium: 8.7 mg/dL — ABNORMAL LOW (ref 8.9–10.3)
Chloride: 106 mmol/L (ref 101–111)
Creatinine, Ser: 1.37 mg/dL — ABNORMAL HIGH (ref 0.44–1.00)
GFR calc Af Amer: 46 mL/min — ABNORMAL LOW (ref >60)
GFR calc non Af Amer: 40 mL/min — ABNORMAL LOW (ref >60)
GLUCOSE: 199 mg/dL — AB (ref 65–99)
POTASSIUM: 4 mmol/L (ref 3.5–5.1)
SODIUM: 136 mmol/L (ref 135–145)
Total Protein: 6.1 g/dL — ABNORMAL LOW (ref 6.5–8.1)

## 2016-11-28 LAB — I-STAT CG4 LACTIC ACID, ED
LACTIC ACID, VENOUS: 2.03 mmol/L — AB (ref 0.5–1.9)
Lactic Acid, Venous: 1.42 mmol/L (ref 0.5–1.9)

## 2016-11-28 LAB — CBC WITH DIFFERENTIAL/PLATELET
BASOS PCT: 1 %
Basophils Absolute: 0.1 10*3/uL (ref 0.0–0.1)
Eosinophils Absolute: 0.2 10*3/uL (ref 0.0–0.7)
Eosinophils Relative: 2 %
HEMATOCRIT: 35.1 % — AB (ref 36.0–46.0)
Hemoglobin: 11.3 g/dL — ABNORMAL LOW (ref 12.0–15.0)
LYMPHS ABS: 2.3 10*3/uL (ref 0.7–4.0)
Lymphocytes Relative: 28 %
MCH: 30.3 pg (ref 26.0–34.0)
MCHC: 32.2 g/dL (ref 30.0–36.0)
MCV: 94.1 fL (ref 78.0–100.0)
MONO ABS: 0.6 10*3/uL (ref 0.1–1.0)
MONOS PCT: 7 %
NEUTROS ABS: 5 10*3/uL (ref 1.7–7.7)
Neutrophils Relative %: 62 %
Platelets: 265 10*3/uL (ref 150–400)
RBC: 3.73 MIL/uL — ABNORMAL LOW (ref 3.87–5.11)
RDW: 14.8 % (ref 11.5–15.5)
WBC: 8.2 10*3/uL (ref 4.0–10.5)

## 2016-11-28 LAB — I-STAT TROPONIN, ED
TROPONIN I, POC: 0.33 ng/mL — AB (ref 0.00–0.08)
Troponin i, poc: 0.41 ng/mL (ref 0.00–0.08)

## 2016-11-28 LAB — CBC
HCT: 37.1 % (ref 36.0–46.0)
Hemoglobin: 11.8 g/dL — ABNORMAL LOW (ref 12.0–15.0)
MCH: 29.9 pg (ref 26.0–34.0)
MCHC: 31.8 g/dL (ref 30.0–36.0)
MCV: 94.2 fL (ref 78.0–100.0)
PLATELETS: 267 10*3/uL (ref 150–400)
RBC: 3.94 MIL/uL (ref 3.87–5.11)
RDW: 14.8 % (ref 11.5–15.5)
WBC: 8.4 10*3/uL (ref 4.0–10.5)

## 2016-11-28 LAB — CBG MONITORING, ED: Glucose-Capillary: 208 mg/dL — ABNORMAL HIGH (ref 65–99)

## 2016-11-28 LAB — GLUCOSE, CAPILLARY: Glucose-Capillary: 205 mg/dL — ABNORMAL HIGH (ref 65–99)

## 2016-11-28 LAB — MAGNESIUM: MAGNESIUM: 2 mg/dL (ref 1.7–2.4)

## 2016-11-28 LAB — BRAIN NATRIURETIC PEPTIDE: B NATRIURETIC PEPTIDE 5: 797.4 pg/mL — AB (ref 0.0–100.0)

## 2016-11-28 LAB — CREATININE, SERUM
Creatinine, Ser: 1.15 mg/dL — ABNORMAL HIGH (ref 0.44–1.00)
GFR calc Af Amer: 57 mL/min — ABNORMAL LOW (ref >60)
GFR calc non Af Amer: 49 mL/min — ABNORMAL LOW (ref >60)

## 2016-11-28 LAB — TROPONIN I: Troponin I: 0.33 ng/mL (ref <0.03)

## 2016-11-28 MED ORDER — ENOXAPARIN SODIUM 40 MG/0.4ML ~~LOC~~ SOLN
40.0000 mg | SUBCUTANEOUS | Status: DC
  Administered 2016-11-28: 40 mg via SUBCUTANEOUS
  Filled 2016-11-28: qty 0.4

## 2016-11-28 MED ORDER — ONDANSETRON HCL 4 MG/2ML IJ SOLN: 4.0000 mg | Freq: Four times a day (QID) | INTRAMUSCULAR | Status: DC | PRN

## 2016-11-28 MED ORDER — CARVEDILOL 3.125 MG PO TABS
3.1250 mg | ORAL_TABLET | Freq: Two times a day (BID) | ORAL | Status: DC
  Administered 2016-11-28 – 2016-11-29 (×2): 3.125 mg via ORAL
  Filled 2016-11-28 (×2): qty 1

## 2016-11-28 MED ORDER — ALBUTEROL SULFATE (2.5 MG/3ML) 0.083% IN NEBU: 2.5000 mg | INHALATION_SOLUTION | RESPIRATORY_TRACT | Status: DC | PRN

## 2016-11-28 MED ORDER — ACETAMINOPHEN 325 MG PO TABS: 650.0000 mg | ORAL_TABLET | Freq: Four times a day (QID) | ORAL | Status: DC | PRN

## 2016-11-28 MED ORDER — NITROGLYCERIN 0.4 MG SL SUBL: 0.4000 mg | SUBLINGUAL_TABLET | SUBLINGUAL | Status: DC | PRN

## 2016-11-28 MED ORDER — ACETAMINOPHEN 650 MG RE SUPP: 650.0000 mg | Freq: Four times a day (QID) | RECTAL | Status: DC | PRN

## 2016-11-28 MED ORDER — MOMETASONE FURO-FORMOTEROL FUM 200-5 MCG/ACT IN AERO
2.0000 | INHALATION_SPRAY | Freq: Two times a day (BID) | RESPIRATORY_TRACT | Status: DC
  Administered 2016-11-28 – 2016-11-29 (×2): 2 via RESPIRATORY_TRACT
  Filled 2016-11-28: qty 8.8

## 2016-11-28 MED ORDER — INSULIN GLARGINE 100 UNIT/ML ~~LOC~~ SOLN
20.0000 [IU] | Freq: Every day | SUBCUTANEOUS | Status: DC
  Administered 2016-11-28: 20 [IU] via SUBCUTANEOUS
  Filled 2016-11-28 (×2): qty 0.2

## 2016-11-28 MED ORDER — SODIUM CHLORIDE 0.9% FLUSH
3.0000 mL | Freq: Two times a day (BID) | INTRAVENOUS | Status: DC
  Administered 2016-11-29: 3 mL via INTRAVENOUS

## 2016-11-28 MED ORDER — LEVOTHYROXINE SODIUM 100 MCG PO TABS
100.0000 ug | ORAL_TABLET | Freq: Every day | ORAL | Status: DC
  Administered 2016-11-29: 100 ug via ORAL
  Filled 2016-11-28: qty 1

## 2016-11-28 MED ORDER — SODIUM CHLORIDE 0.9 % IV BOLUS (SEPSIS)
1000.0000 mL | Freq: Once | INTRAVENOUS | Status: AC
  Administered 2016-11-28: 1000 mL via INTRAVENOUS

## 2016-11-28 MED ORDER — SODIUM CHLORIDE 0.9% FLUSH: 3.0000 mL | INTRAVENOUS | Status: DC | PRN

## 2016-11-28 MED ORDER — ONDANSETRON HCL 4 MG PO TABS
4.0000 mg | ORAL_TABLET | Freq: Four times a day (QID) | ORAL | Status: DC | PRN
Start: 2016-11-28 — End: 2016-11-29

## 2016-11-28 MED ORDER — ROSUVASTATIN CALCIUM 10 MG PO TABS
20.0000 mg | ORAL_TABLET | Freq: Every day | ORAL | Status: DC
  Administered 2016-11-29: 20 mg via ORAL
  Filled 2016-11-28: qty 2

## 2016-11-28 MED ORDER — ADULT MULTIVITAMIN W/MINERALS CH
1.0000 | ORAL_TABLET | Freq: Every day | ORAL | Status: DC
  Administered 2016-11-29: 1 via ORAL
  Filled 2016-11-28: qty 1

## 2016-11-28 MED ORDER — INSULIN GLARGINE 100 UNIT/ML ~~LOC~~ SOLN
30.0000 [IU] | Freq: Every morning | SUBCUTANEOUS | Status: DC
  Administered 2016-11-29: 30 [IU] via SUBCUTANEOUS
  Filled 2016-11-28: qty 0.3

## 2016-11-28 MED ORDER — MONTELUKAST SODIUM 10 MG PO TABS
10.0000 mg | ORAL_TABLET | Freq: Every day | ORAL | Status: DC
  Filled 2016-11-28: qty 1

## 2016-11-28 MED ORDER — POLYETHYLENE GLYCOL 3350 17 G PO PACK: 17.0000 g | PACK | ORAL | Status: DC

## 2016-11-28 MED ORDER — TOPIRAMATE 25 MG PO TABS
50.0000 mg | ORAL_TABLET | Freq: Two times a day (BID) | ORAL | Status: DC
  Administered 2016-11-28: 25 mg via ORAL
  Administered 2016-11-29: 50 mg via ORAL
  Filled 2016-11-28 (×2): qty 2

## 2016-11-28 MED ORDER — ASPIRIN 81 MG PO CHEW
81.0000 mg | CHEWABLE_TABLET | Freq: Every day | ORAL | Status: DC
  Administered 2016-11-29: 81 mg via ORAL
  Filled 2016-11-28: qty 1

## 2016-11-28 MED ORDER — GABAPENTIN 300 MG PO CAPS
300.0000 mg | ORAL_CAPSULE | Freq: Three times a day (TID) | ORAL | Status: DC
  Administered 2016-11-28 – 2016-11-29 (×2): 300 mg via ORAL
  Filled 2016-11-28 (×2): qty 1

## 2016-11-28 MED ORDER — FENOFIBRATE 160 MG PO TABS
160.0000 mg | ORAL_TABLET | Freq: Every day | ORAL | Status: DC
  Administered 2016-11-28: 160 mg via ORAL
  Filled 2016-11-28: qty 1

## 2016-11-28 MED ORDER — PANTOPRAZOLE SODIUM 40 MG PO TBEC
40.0000 mg | DELAYED_RELEASE_TABLET | Freq: Every day | ORAL | Status: DC
  Administered 2016-11-29: 40 mg via ORAL
  Filled 2016-11-28: qty 1

## 2016-11-28 MED ORDER — FLUOXETINE HCL 10 MG PO CAPS
10.0000 mg | ORAL_CAPSULE | Freq: Every day | ORAL | Status: DC
  Administered 2016-11-29: 10 mg via ORAL
  Filled 2016-11-28: qty 1

## 2016-11-28 MED ORDER — METOCLOPRAMIDE HCL 10 MG PO TABS
10.0000 mg | ORAL_TABLET | Freq: Four times a day (QID) | ORAL | Status: DC
  Administered 2016-11-29 (×2): 10 mg via ORAL
  Filled 2016-11-28 (×3): qty 1

## 2016-11-28 MED ORDER — SODIUM CHLORIDE 0.9 % IV SOLN: 250.0000 mL | INTRAVENOUS | Status: DC | PRN

## 2016-11-28 MED ORDER — SODIUM CHLORIDE 0.9% FLUSH: 3.0000 mL | Freq: Two times a day (BID) | INTRAVENOUS | Status: DC

## 2016-11-28 MED ORDER — ISOSORBIDE MONONITRATE ER 30 MG PO TB24
30.0000 mg | ORAL_TABLET | Freq: Every day | ORAL | Status: DC
  Administered 2016-11-29: 30 mg via ORAL
  Filled 2016-11-28: qty 1

## 2016-11-28 MED ORDER — INSULIN ASPART 100 UNIT/ML ~~LOC~~ SOLN
0.0000 [IU] | Freq: Three times a day (TID) | SUBCUTANEOUS | Status: DC
  Administered 2016-11-29 (×2): 3 [IU] via SUBCUTANEOUS

## 2016-11-28 MED ORDER — TICAGRELOR 90 MG PO TABS
90.0000 mg | ORAL_TABLET | Freq: Two times a day (BID) | ORAL | Status: DC
  Administered 2016-11-29: 90 mg via ORAL
  Filled 2016-11-28 (×2): qty 1

## 2016-11-28 NOTE — Progress Notes (Signed)
RT setup CPAP and filled humidification chamber with sterile water. RT placed patient on CPAP with 1L O2 bled into circuit. Patient tolerating well at this time. RT will monitor as needed.

## 2016-11-28 NOTE — ED Notes (Signed)
Pt states she feels her normal

## 2016-11-28 NOTE — ED Provider Notes (Signed)
[facility] DEPT Provider Note   CSN: [id number] Arrival date & time: 11/28/16  1433     History   Chief Complaint Chief Complaint  Patient presents with  . Near Syncope  . possible stemi    HPI Danielle Vargas is a 66 y.o. female.  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 Per facility, patient had her eyes open, however was not responding normally. By patient's history, she had finished eating, and was taking a nap. Reports that she could hear them talking to her and she was responding, but that they did not feel that she was responding appropriately.  She denies any syncope.   Patient denies any chest pain, denies shortness of breath, denies syncope, denies nausea, vomiting, diarrhea, fevers, cough, urinary symptoms, black or bloody stools, new numbness or weakness.  She denies falls. Reports that she is taking medications that were prescribed and she was in the hospital. Patient reports that she feels at baseline at this time. Denies any lightheadedness.   Patient had some inferior elevations on her EKG and a code STEMI was called by EMS in route. On arrival to the emergency department, cardiology was at bedside, and reports that she does not have a STEMI, with similar EKG to prior, and no symptoms to suggest this. On EMS arrival, her blood pressures were 80s over 50s, and dropped to 50s over 30s en route. They gave her IV fluids, with recovery to systolics in the 409W.   Past Medical History:  Diagnosis Date  . Acute combined systolic and diastolic heart failure ([place]) 11/19/2016  . Allergy   . Asthma   . CAD (coronary artery disease)    a. s/p lateral STEMI in 2015 2ry to occlusion of OM1 thought to be caused by coronary artery dissection, treated with POBA. b. 11/2016: STEMI with thrombotic  occlusion of distal LCx which was treated with balloon angioplasty.   . Cataract    "just beginning"  . Colon polyps 7/07   hyperplastic and adenomatous  . Depression    pt unsure, psychologist said no  . Diverticulosis   . DM (diabetes mellitus) ([place])   . Dyslipidemia   . External hemorrhoid   . Fatty liver   . Gastroparesis   . HTN (hypertension)   . Hyperlipidemia   . 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 Hypothyroidism   . Migraine   . Myocardial infarction    FEb 2015  . Nephrolithiasis   . Obesity   . Osteoporosis   . Sleep apnea    wears CPAP  . ST elevation myocardial infarction (STEMI) of inferolateral wall ([place]) 11/19/2016  . Stroke [facility])     Patient Active Problem List   Diagnosis Date Noted  . Pre-syncope 11/28/2016  . Acute combined systolic and diastolic heart failure ([place]) 11/19/2016  . ST elevation myocardial infarction (STEMI) of inferolateral wall ([place]) 11/19/2016  . Spastic hemiplegia affecting nondominant side ([place]) 08/05/2016  . OSA (obstructive sleep apnea) 07/30/2016  . Cerebral infarction involving right middle cerebral artery ([place]) 07/30/2016  . 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 Left spastic hemiparesis ([place]) 07/15/2016  . Bursitis of right hip 04/01/2016  . DM type 2 with diabetic peripheral neuropathy ([place])   . Vascular headache   .  Right middle cerebral artery stroke ([place]) 03/14/2016  . Asthma   . Acute lower UTI   . Benign essential HTN   . HLD (hyperlipidemia)   . S/P rotator cuff repair   . Tachypnea   . AKI (acute kidney injury) ([place])   . Acute right MCA stroke ([place])   . Back pain   . Ischemic stroke ([place]) 03/10/2016  . Stroke (cerebrum) ([place]) 03/10/2016  . Chest pain, rule out acute myocardial infarction 11/10/2015  . Chest pain 11/10/2015  . Obesity   . Hypothyroidism   . 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  UTI (urinary tract infection) 06/16/2015  . Type 2 diabetes mellitus with complication ([place]) 21/30/8657  . RUQ abdominal pain 04/17/2015  . Hx of colonic polyps 04/17/2015  . Family hx of colon cancer 04/17/2015  . CPAP rhinitis 12/08/2014  . OSA on CPAP 12/08/2014  . 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 Retinopathy due to secondary diabetes mellitus, without macular edema, with moderate nonproliferative retinopathy ([place]) 09/18/2014  . Neuropathy due to secondary diabetes mellitus ([place]) 09/18/2014  . DM (diabetes mellitus) ([place]) 09/18/2014  . Obesity hypoventilation syndrome ([place]) 09/18/2014  . CAD in native artery 01/23/2014  . Morbid obesity ([place]) 01/23/2014  . Renal insufficiency 11/17/2013  . HTN (hypertension) 10/29/2013  . Chronic diastolic heart failure ([place]) 10/29/2013  . 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 LADA (latent autoimmune diabetes in adults), managed as type 1 ([place]) 08/08/2013    Past Surgical History:  Procedure Laterality Date  . BREAST REDUCTION SURGERY  '88  . CARPAL TUNNEL RELEASE  2000   right wrist  . CERVICAL DISC SURGERY  '98   fusion  . CESAREAN SECTION  '79  '84  . COLONOSCOPY    . CORONARY BALLOON ANGIOPLASTY N/A 11/19/2016   Procedure: Coronary Balloon Angioplasty;  Surgeon: Sherren Mocha, MD;  Location: [facility] CV LAB;  Service: Cardiovascular;  Laterality: N/A;  . EP IMPLANTABLE DEVICE N/A 03/12/2016   Procedure: Loop Recorder Insertion;  Surgeon: Will Meredith Leeds, MD;  Location: [facility] CV LAB;  Service: Cardiovascular;  Laterality: N/A;  . LEFT HEART CATH AND CORONARY ANGIOGRAPHY N/A 11/19/2016   Procedure: Left Heart Cath and Coronary Angiography;  Surgeon: Sherren Mocha, MD;  Location: [facility];  Service: Cardiovascular;  Laterality: N/A;  . LEFT HEART CATHETERIZATION WITH CORONARY ANGIOGRAM N/A 10/26/2013   Procedure: LEFT HEART CATHETERIZATION WITH CORONARY ANGIOGRAM;  Surgeon: Sinclair Grooms, MD;  Location: [facility]  CATH LAB;  Service: Cardiovascular;  Laterality: N/A;  . ROTATOR CUFF REPAIR  '09   left shoulder  . TEE WITHOUT CARDIOVERSION N/A 03/12/2016   Procedure: TRANSESOPHAGEAL ECHOCARDIOGRAM (TEE);  Surgeon: Sanda Klein, MD;  Location: [facility] ENDOSCOPY;  Service: Cardiovascular;  Laterality: N/A;  . TUBAL LIGATION  '85  . ULNAR NERVE REPAIR  '02, '08   left done, then right    OB History    No data available       Home Medications    Prior to Admission medications   Medication Sig Start Date End Date Taking? Authorizing Provider  acetaminophen (TYLENOL) 325 MG tablet Take 2 tablets (650 mg total) by mouth every 6 (six) hours as needed for mild pain (or temp >/= 99.5 F). 04/01/16   Ivan Anchors Love, PA-C  albuterol (PROVENTIL HFA;VENTOLIN HFA) 108 (90 BASE) MCG/ACT inhaler Inhale 2 puffs into the lungs every 6 (six) hours as needed for wheezing or shortness of breath.     Historical Provider,  MD  aspirin EC 81 MG EC tablet Take 1 tablet (81 mg total) by mouth daily. 11/26/16   Erma Heritage, PA  carvedilol (COREG) 3.125 MG tablet Take 1 tablet by mouth 2 (two) times daily. 09/15/16   Historical Provider, MD  docusate sodium (COLACE) 100 MG capsule Take 1 capsule (100 mg total) by mouth 2 (two) times daily. 04/01/16   Bary Leriche, PA-C  fenofibrate 160 MG tablet Take 160 mg by mouth at bedtime.     Historical Provider, MD  FLUoxetine (PROZAC) 10 MG capsule Take 1 capsule (10 mg total) by mouth daily. 04/01/16   Bary Leriche, PA-C  furosemide (LASIX) 20 MG tablet Take 0.5 tablets (10 mg total) by mouth daily. 04/01/16   Bary Leriche, PA-C  gabapentin (NEURONTIN) 300 MG capsule Take 300 mg by mouth 3 (three) times daily.    Historical Provider, MD  HUMALOG KWIKPEN 100 UNIT/ML KiwkPen Inject 30 Units into the skin 2 (two) times daily. And before breakfast and dinner 10/07/16   Historical Provider, MD  HYDROcodone-acetaminophen (NORCO) 10-325 MG tablet 1 tablet every 6 (six) hours as needed.  07/05/16    Historical Provider, MD  insulin aspart (NOVOLOG) 100 UNIT/ML injection Inject 10-25 Units into the skin 3 (three) times daily with meals. 07/24/16   Philemon Kingdom, MD  insulin glargine (LANTUS) 100 UNIT/ML injection Inject 30 units in am and 20 units at bedtime. Patient taking differently: Inject into the skin. Inject 30 units in am and 20 units at bedtime. 07/24/16   Philemon Kingdom, MD  isosorbide mononitrate (IMDUR) 30 MG 24 hr tablet Take 1 tablet (30 mg total) by mouth daily. 11/26/16   Erma Heritage, PA  levothyroxine (SYNTHROID, LEVOTHROID) 100 MCG tablet Take 1 tablet (100 mcg total) by mouth daily before breakfast. 04/01/16   Ivan Anchors Love, PA-C  Menthol-Methyl Salicylate (MUSCLE RUB) 10-15 % CREA Apply 1 application topically 2 (two) times daily as needed for muscle pain. For right hip pain 04/01/16   Ivan Anchors Love, PA-C  metoCLOPramide (REGLAN) 10 MG tablet Take 10 mg by mouth 4 (four) times daily.     Historical Provider, MD  mometasone (NASONEX) 50 MCG/ACT nasal spray Place 2 sprays into the nose daily. Patient not taking: Reported on 11/19/2016 08/30/15   Larey Seat, MD  mometasone-formoterol [facility]) 200-5 MCG/ACT AERO Inhale 2 puffs into the lungs 2 (two) times daily. Patient not taking: Reported on 11/19/2016 04/01/16   Ivan Anchors Love, PA-C  montelukast (SINGULAIR) 10 MG tablet Take 10 mg by mouth at bedtime.    Historical Provider, MD  Multiple Vitamin (MULTIVITAMIN WITH MINERALS) TABS tablet Take 1 tablet by mouth daily. One A Day 50+    Historical Provider, MD  nitroGLYCERIN (NITROSTAT) 0.4 MG SL tablet Place 1 tablet (0.4 mg total) under the tongue every 5 (five) minutes as needed for chest pain. 10/29/13   Rhonda G Barrett, PA-C  pantoprazole (PROTONIX) 40 MG tablet Take 1 tablet (40 mg total) by mouth daily. 04/01/16   Ivan Anchors Love, PA-C  polyethylene glycol (MIRALAX / GLYCOLAX) packet Take 17 g by mouth every other day.     Historical Provider, MD  rosuvastatin  (CRESTOR) 20 MG tablet Take 1 tablet (20 mg total) by mouth daily. Patient taking differently: Take 20 mg by mouth at bedtime.  04/01/16   Bary Leriche, PA-C  ticagrelor (BRILINTA) 90 MG TABS tablet Take 1 tablet (90 mg total) by mouth 2 (two) times daily.  11/25/16   Erma Heritage, PA  tiZANidine (ZANAFLEX) 2 MG tablet Take 2 mg by mouth 3 (three) times daily.  10/26/15   Historical Provider, MD  topiramate (TOPAMAX) 50 MG tablet Take 50 mg by mouth 2 (two) times daily.    Historical Provider, MD    Family History Family History  Problem Relation Age of Onset  . COPD Father   . Stroke Father   . Emphysema Father   . Heart failure Mother   . Heart attack Mother     several  . Diabetes Mother   . Kidney disease Mother   . Heart failure Brother   . Hypertension Brother   . Colon cancer  45    mat. 1st cousin  . Other      celiac sprue, 1/2 brother  . Diabetes Brother     x 3  . Lung cancer Brother   . Cirrhosis Sister     liver transplant  . Liver disease Sister     transplant  . Heart attack Paternal Grandfather   . Heart attack Brother   . Colon cancer Paternal Aunt 43  . Esophageal cancer Neg Hx   . Stomach cancer Neg Hx   . Rectal cancer Neg Hx     Social History Social History  Substance Use Topics  . Smoking status: Never Smoker  . Smokeless tobacco: Never Used  . Alcohol use No     Allergies   Invokana [canagliflozin]; Nsaids; and Penicillins   Review of Systems Review of Systems  Constitutional: Negative for fever.  HENT: Negative for sore throat.   Eyes: Negative for visual disturbance.  Respiratory: Negative for cough and shortness of breath.   Cardiovascular: Negative for chest pain.  Gastrointestinal: Negative for abdominal pain, nausea and vomiting.  Genitourinary: Negative for difficulty urinating and dysuria.  Musculoskeletal: Negative for back pain and neck pain.  Skin: Negative for rash.  Neurological: Negative for syncope (pt denies but  ?), facial asymmetry, weakness, light-headedness, numbness and headaches.     Physical Exam Updated Vital Signs BP 123/61   Pulse 94   Temp 98.4 F (36.9 C) (Oral)   Resp 17   SpO2 98%   Physical Exam  Constitutional: She is oriented to person, place, and time. She appears well-developed and well-nourished. No distress.  HENT:  Head: Normocephalic and atraumatic.  Eyes: Conjunctivae and EOM are normal.  Neck: Normal range of motion.  Cardiovascular: Normal rate, regular rhythm, normal heart sounds and intact distal pulses.  Exam reveals no gallop and no friction rub.   No murmur heard. Pulmonary/Chest: Effort normal and breath sounds normal. No respiratory distress. She has no wheezes. She has no rales.  Abdominal: Soft. She exhibits no distension. There is no tenderness. There is no guarding.  Musculoskeletal: She exhibits no edema or tenderness.  Neurological: She is alert and oriented to person, place, and time. GCS eye subscore is 4. GCS verbal subscore is 5. GCS motor subscore is 6.  Chronic left sided weakness  Skin: Skin is warm and dry. No rash noted. She is not diaphoretic. No erythema.  Nursing note and vitals reviewed.    ED Treatments / Results  Labs (all labs ordered are listed, but only abnormal results are displayed) Labs Reviewed  CBC WITH DIFFERENTIAL/PLATELET - Abnormal; Notable for the following:       Result Value   RBC 3.73 (*)    Hemoglobin 11.3 (*)    HCT 35.1 (*)  All other components within normal limits  COMPREHENSIVE METABOLIC PANEL - Abnormal; Notable for the following:    CO2 20 (*)    Glucose, Bld 199 (*)    BUN 32 (*)    Creatinine, Ser 1.37 (*)    Calcium 8.7 (*)    Total Protein 6.1 (*)    Albumin 2.8 (*)    GFR calc non Af Amer 40 (*)    GFR calc Af Amer 46 (*)    All other components within normal limits  BRAIN NATRIURETIC PEPTIDE - Abnormal; Notable for the following:    B Natriuretic Peptide 797.4 (*)    All other components  within normal limits  CBG MONITORING, ED - Abnormal; Notable for the following:    Glucose-Capillary 208 (*)    All other components within normal limits  I-STAT TROPOININ, ED - Abnormal; Notable for the following:    Troponin i, poc 0.41 (*)    All other components within normal limits  I-STAT CG4 LACTIC ACID, ED - Abnormal; Notable for the following:    Lactic Acid, Venous 2.03 (*)    All other components within normal limits  MAGNESIUM  URINALYSIS, ROUTINE W REFLEX MICROSCOPIC  I-STAT TROPOININ, ED  I-STAT CG4 LACTIC ACID, ED    EKG  EKG Interpretation None       Radiology Dg Chest Portable 1 View  Result Date: 11/28/2016 CLINICAL DATA:  Syncope EXAM: PORTABLE CHEST 1 VIEW COMPARISON:  11/19/2016 FINDINGS: Mild cardiomegaly. Loop recorder device projects over the anterior chest. No confluent opacities, effusions or edema. No acute bony abnormality. IMPRESSION: Mild cardiomegaly.  No active disease. Electronically Signed   By: Rolm Baptise M.D.   On: 11/28/2016 15:56    Procedures Procedures (including critical care time)  Medications Ordered in ED Medications  sodium chloride 0.9 % bolus 1,000 mL (0 mLs Intravenous Stopped 11/28/16 1730)     Initial Impression / Assessment and Plan / ED Course  I have reviewed the triage vital signs and the nursing notes.  Pertinent labs & imaging results that were available during my care of the patient were reviewed by me and considered in my medical decision making (see chart for details).     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 Per facility, patient had her eyes open, however was not responding normally. By patient's history, she had finished eating, and was taking a nap. Reports that she could hear them talking to her and she was responding, but that  they did not feel that she was responding appropriately.  She denies any syncope.   Patient had some inferior elevations on her EKG and a code STEMI was called by EMS in route. On arrival to the emergency department, cardiology was at bedside, and reports that she does not have a STEMI, with similar EKG to prior, and no symptoms to suggest this.  Cardiology reports that they are available if consultation is felt necessary, however at this point do not feel she requires a cardiology admission. On EMS arrival, her blood pressures were 80s over 50s, and dropped to 50s over 30s en route. They gave her IV fluids, with recovery to systolics in the 161W.    Differential diagnosis for her hypotensive episode include anemia, GI bleed, infection, medication effects, tamponade.  Bedside echo with difficult windows, however no significant effusion and  observed by cardiology. Labs show no significant Y abnormalities. He will open at baseline. Have low suspicion for PE or ACS given no chest pain or shortness of breath. Troponin is elevated to 0.41, which is decreased from troponin at time of discharge which was 11. Patient is asymptomatic in the emergency department. Blood pressures, which initially were as low as 80 systolic and 47V systolic in the emergency department have improved with IV fluid. She is now normotensive. Per medication when she from facility, it appears patient received Imdur yesterday and today, however not days prior. It is possible that this episode of hypotension secondary to medication, as well as diuresis. We'll admit for observation and continued care    Final Clinical Impressions(s) / ED Diagnoses   Final diagnoses:  Hypotension, unspecified hypotension type  Near syncope or episode of altered mental status per facility    New Prescriptions New Prescriptions   No medications on file     Gareth Morgan, MD 11/28/16 1749

## 2016-11-28 NOTE — ED Notes (Signed)
Dr. Billy Fischer notified of pt troponin.

## 2016-11-28 NOTE — ED Triage Notes (Addendum)
Pt to ED via [facility] from [facility], with staff calling because pt had an episode of sluggish to respond-- pt's initial BP was 80/50, did drop to 50/30 enroute. Received 500cc bolus, bp on arrival 126/60.  Also received ASA 324mg  po.  IV 20g right hand, 22 g left upper arm.  Both flush with no difficulty.

## 2016-11-28 NOTE — ED Notes (Signed)
X-ray at bedside

## 2016-11-28 NOTE — H&P (Signed)
HISTORY AND PHYSICAL       PATIENT DETAILS Name: Danielle Vargas Age: 66 y.o. Sex: female Date of Birth: [birth year] Admit Date: 11/28/2016 QMG:[id number], [name], MD   Patient coming from: SNF  CHIEF COMPLAINT:  Presyncope, hypotension  HPI: Danielle Vargas is a 66 y.o. female with medical history significant of recent STEMI status post PCI on 1/95, chronic systolic heart failure (EF by TTE on 11/1518-25%), history of prior CVA, hypertension, insulin-dependent diabetes-who was just discharged from this facility by the cardiology service on 3/20 and sent to a skilled nursing facility-presents to the ED for an evaluation of transient decreased mentation associated with hypotension. Per patient, she was in her usual state of health, no history of nausea, vomiting or diarrhea. No history of fever. She had had lunch this afternoon, following which she was in a wheelchair, and thinks she briefly took a "snap". The next thing she remembers is people trying to wake her up. From what I can gather after speaking with the patient, and ED M.D.-there was no loss of consciousness, but just decreased mentation. EMS was called, she was initially hypotensive and required IV fluid boluse with improvement in her blood pressure. In the ED, ED M.D. performed a bedside ultrasound-there was no evidence of pericardial tamponade. ED MD discussed case with cardiology, who suggested overnight admission for further observation. Per ED M.D., cardiology also reviewed patient's EKG and thought that this was not STEMI.   At no point during the day the patient  had chest pain or shortness of breath  ED Course:  In the ED, patient was evaluated by ED M.D., and cardiology-EKG was reviewed, not felt to have STEMI. As noted above, he ED M.D. performed a bedside ultrasound without any evidence of pericardial effusion. Patient's blood pressure was initially low, but responded to IV fluids. Subsequently I was called with this  patient for further evaluation and treatment.  Note: Lives at: SNF Mobility:WalkerWheelchair Chronic Indwelling Foley:no   REVIEW OF SYSTEMS:  Constitutional:   No  weight loss, night sweats,  Fevers, chills, fatigue.  HEENT:    No headaches, Dysphagia,Tooth/dental problems,Sore throat   Cardio-vascular: No chest pain,Orthopnea, PND,lower extremity edema, anasarca, palpitations  GI:  No heartburn, indigestion, abdominal pain, nausea, vomiting, diarrhea, melena or hematochezia  Resp: No shortness of breath, cough, hemoptysis,plueritic chest pain.   Skin:  No rash or lesions.  GU:  No dysuria, change in color of urine, no urgency or frequency.  No flank pain.  Musculoskeletal: No joint pain or swelling.  No decreased range of motion.  Endocrine: No heat intolerance, no cold intolerance, no polyuria, no polydipsia  Psych: No change in mood or affect. No depression or anxiety.   ALLERGIES:   Allergies  Allergen Reactions  . Invokana [Canagliflozin] Diarrhea  . Nsaids Other (See Comments)    GI problems/ stomach pain  . Penicillins Rash    Has patient had a PCN reaction causing immediate rash, facial/tongue/throat swelling, SOB or lightheadedness with hypotension: No (rash on legs after 3 days [place] -June 2016) Has patient had a PCN reaction causing severe rash involving mucus membranes or skin necrosis: No Has patient had a PCN reaction that required hospitalization No Has patient had a PCN reaction occurring within the last 10 years: Yes If all of the above answers are "NO", then may proceed with Cephalosporin use.    PAST MEDICAL HISTORY: Past Medical History:  Diagnosis Date  . Acute combined systolic and diastolic  heart failure ([place]) 11/19/2016  . Allergy   . Asthma   . CAD (coronary artery disease)    a. s/p lateral STEMI in 2015 2ry to occlusion of OM1 thought to be caused by coronary artery dissection, treated with POBA. b. 11/2016: STEMI with thrombotic  occlusion of distal LCx which was treated with balloon angioplasty.   . Cataract    "just beginning"  . Colon polyps 7/07   hyperplastic and adenomatous  . Depression    pt unsure, psychologist said no  . Diverticulosis   . DM (diabetes mellitus) ([place])   . Dyslipidemia   . External hemorrhoid   . Fatty liver   . Gastroparesis   . HTN (hypertension)   . Hyperlipidemia   . 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 Hypothyroidism   . Migraine   . Myocardial infarction    FEb 2015  . Nephrolithiasis   . Obesity   . Osteoporosis   . Sleep apnea    wears CPAP  . ST elevation myocardial infarction (STEMI) of inferolateral wall ([place]) 11/19/2016  . Stroke [facility])     PAST SURGICAL HISTORY: Past Surgical History:  Procedure Laterality Date  . BREAST REDUCTION SURGERY  '88  . CARPAL TUNNEL RELEASE  2000   right wrist  . CERVICAL DISC SURGERY  '98   fusion  . CESAREAN SECTION  '79  '84  . COLONOSCOPY    . CORONARY BALLOON ANGIOPLASTY N/A 11/19/2016   Procedure: Coronary Balloon Angioplasty;  Surgeon: Sherren Mocha, MD;  Location: [place] [facility];  Service: Cardiovascular;  Laterality: N/A;  . EP IMPLANTABLE DEVICE N/A 03/12/2016   Procedure: Loop Recorder Insertion;  Surgeon: Will Meredith Leeds, MD;  Location: [facility];  Service: Cardiovascular;  Laterality: N/A;  . LEFT HEART CATH AND CORONARY ANGIOGRAPHY N/A 11/19/2016   Procedure: Left Heart Cath and Coronary Angiography;  Surgeon: Sherren Mocha, MD;  Location: [facility] CV LAB;  Service: Cardiovascular;  Laterality: N/A;  . LEFT HEART CATHETERIZATION WITH CORONARY ANGIOGRAM N/A 10/26/2013   Procedure: LEFT HEART CATHETERIZATION WITH CORONARY ANGIOGRAM;  Surgeon: Sinclair Grooms, MD;  Location: [facility] CATH LAB;  Service: Cardiovascular;  Laterality: N/A;  . ROTATOR CUFF REPAIR  '09   left shoulder  . TEE WITHOUT CARDIOVERSION N/A 03/12/2016   Procedure: TRANSESOPHAGEAL ECHOCARDIOGRAM (TEE);  Surgeon: Sanda Klein, MD;   Location: [facility] ENDOSCOPY;  Service: Cardiovascular;  Laterality: N/A;  . TUBAL LIGATION  '85  . ULNAR NERVE REPAIR  '02, '08   left done, then right    MEDICATIONS AT HOME: Prior to Admission medications   Medication Sig Start Date End Date Taking? Authorizing Provider  acetaminophen (TYLENOL) 325 MG tablet Take 2 tablets (650 mg total) by mouth every 6 (six) hours as needed for mild pain (or temp >/= 99.5 F). 04/01/16   Ivan Anchors Love, PA-C  albuterol (PROVENTIL HFA;VENTOLIN HFA) 108 (90 BASE) MCG/ACT inhaler Inhale 2 puffs into the lungs every 6 (six) hours as needed for wheezing or shortness of breath.     Historical Provider, MD  aspirin EC 81 MG EC tablet Take 1 tablet (81 mg total) by mouth daily. 11/26/16   Erma Heritage, PA  carvedilol (COREG) 3.125 MG tablet Take 1 tablet by mouth 2 (two) times daily. 09/15/16   Historical Provider, MD  docusate sodium (COLACE) 100 MG capsule Take 1 capsule (100 mg total) by mouth 2 (two) times daily. 04/01/16   Bary Leriche, PA-C  fenofibrate  160 MG tablet Take 160 mg by mouth at bedtime.     Historical Provider, MD  FLUoxetine (PROZAC) 10 MG capsule Take 1 capsule (10 mg total) by mouth daily. 04/01/16   Bary Leriche, PA-C  furosemide (LASIX) 20 MG tablet Take 0.5 tablets (10 mg total) by mouth daily. 04/01/16   Bary Leriche, PA-C  gabapentin (NEURONTIN) 300 MG capsule Take 300 mg by mouth 3 (three) times daily.    Historical Provider, MD  HUMALOG KWIKPEN 100 UNIT/ML KiwkPen Inject 30 Units into the skin 2 (two) times daily. And before breakfast and dinner 10/07/16   Historical Provider, MD  HYDROcodone-acetaminophen (NORCO) 10-325 MG tablet 1 tablet every 6 (six) hours as needed.  07/05/16   Historical Provider, MD  insulin aspart (NOVOLOG) 100 UNIT/ML injection Inject 10-25 Units into the skin 3 (three) times daily with meals. 07/24/16   Philemon Kingdom, MD  insulin glargine (LANTUS) 100 UNIT/ML injection Inject 30 units in am and 20 units at  bedtime. Patient taking differently: Inject into the skin. Inject 30 units in am and 20 units at bedtime. 07/24/16   Philemon Kingdom, MD  isosorbide mononitrate (IMDUR) 30 MG 24 hr tablet Take 1 tablet (30 mg total) by mouth daily. 11/26/16   Erma Heritage, PA  levothyroxine (SYNTHROID, LEVOTHROID) 100 MCG tablet Take 1 tablet (100 mcg total) by mouth daily before breakfast. 04/01/16   Ivan Anchors Love, PA-C  Menthol-Methyl Salicylate (MUSCLE RUB) 10-15 % CREA Apply 1 application topically 2 (two) times daily as needed for muscle pain. For right hip pain 04/01/16   Ivan Anchors Love, PA-C  metoCLOPramide (REGLAN) 10 MG tablet Take 10 mg by mouth 4 (four) times daily.     Historical Provider, MD  mometasone (NASONEX) 50 MCG/ACT nasal spray Place 2 sprays into the nose daily. Patient not taking: Reported on 11/19/2016 08/30/15   Larey Seat, MD  mometasone-formoterol [facility]) 200-5 MCG/ACT AERO Inhale 2 puffs into the lungs 2 (two) times daily. Patient not taking: Reported on 11/19/2016 04/01/16   Ivan Anchors Love, PA-C  montelukast (SINGULAIR) 10 MG tablet Take 10 mg by mouth at bedtime.    Historical Provider, MD  Multiple Vitamin (MULTIVITAMIN WITH MINERALS) TABS tablet Take 1 tablet by mouth daily. One A Day 50+    Historical Provider, MD  nitroGLYCERIN (NITROSTAT) 0.4 MG SL tablet Place 1 tablet (0.4 mg total) under the tongue every 5 (five) minutes as needed for chest pain. 10/29/13   Rhonda G Barrett, PA-C  pantoprazole (PROTONIX) 40 MG tablet Take 1 tablet (40 mg total) by mouth daily. 04/01/16   Ivan Anchors Love, PA-C  polyethylene glycol (MIRALAX / GLYCOLAX) packet Take 17 g by mouth every other day.     Historical Provider, MD  rosuvastatin (CRESTOR) 20 MG tablet Take 1 tablet (20 mg total) by mouth daily. Patient taking differently: Take 20 mg by mouth at bedtime.  04/01/16   Bary Leriche, PA-C  ticagrelor (BRILINTA) 90 MG TABS tablet Take 1 tablet (90 mg total) by mouth 2 (two) times daily. 11/25/16    Erma Heritage, PA  tiZANidine (ZANAFLEX) 2 MG tablet Take 2 mg by mouth 3 (three) times daily.  10/26/15   Historical Provider, MD  topiramate (TOPAMAX) 50 MG tablet Take 50 mg by mouth 2 (two) times daily.    Historical Provider, MD    FAMILY HISTORY: Family History  Problem Relation Age of Onset  . COPD Father   . Stroke Father   .  Emphysema Father   . Heart failure Mother   . Heart attack Mother     several  . Diabetes Mother   . Kidney disease Mother   . Heart failure Brother   . Hypertension Brother   . Colon cancer  45    mat. 1st cousin  . Other      celiac sprue, 1/2 brother  . Diabetes Brother     x 3  . Lung cancer Brother   . Cirrhosis Sister     liver transplant  . Liver disease Sister     transplant  . Heart attack Paternal Grandfather   . Heart attack Brother   . Colon cancer Paternal Aunt 43  . Esophageal cancer Neg Hx   . Stomach cancer Neg Hx   . Rectal cancer Neg Hx     SOCIAL HISTORY:  reports that she has never smoked. She has never used smokeless tobacco. She reports that she does not drink alcohol or use drugs.  PHYSICAL EXAM: Blood pressure 123/61, pulse 94, temperature 98.4 F (36.9 C), temperature source Oral, resp. rate 17, SpO2 98 %.  General appearance :Awake, alert, not in any distress. Speech Clear.  Eyes:, pupils equally reactive to light and accomodation,no scleral icterus. HEENT: Atraumatic and Normocephalic Neck: supple, no JVD. No cervical lymphadenopathy.  Resp:Good air entry bilaterally,No rales CVS: S1 S2 regular GI: Bowel sounds present, Non tender and not distended with no gaurding, rigidity or rebound. Extremities: B/L Lower Ext shows  Neurology: Mild left-sided weakness (which is chronic and unchanged) Psychiatric: Normal judgment and insight. Alert and oriented x 3. Musculoskeletal:gait appears to be normal.No digital cyanosis Skin:No Rash, warm and dry Wounds:N/A  LABS ON ADMISSION:  I have personally reviewed  following labs and imaging studies  CBC:  Recent Labs Lab 11/23/16 0433 11/24/16 0159 11/28/16 1456  WBC 8.2 8.7 8.2  NEUTROABS  --   --  5.0  HGB 11.2* 11.0* 11.3*  HCT 34.7* 34.9* 35.1*  MCV 93.3 94.1 94.1  PLT 253 273 417    Basic Metabolic Panel:  Recent Labs Lab 11/22/16 0247 11/23/16 0433 11/24/16 0159 11/25/16 0232 11/28/16 1456  NA 137 134* 135 136 136  K 3.8 3.8 4.3 4.1 4.0  CL 104 101 99* 101 106  CO2 24 23 22 23  20*  GLUCOSE 248* 299* 209* 222* 199*  BUN 38* 48* 47* 44* 32*  CREATININE 1.47* 1.86* 2.06* 1.51* 1.37*  CALCIUM 8.9 9.1 9.2 9.4 8.7*  MG  --   --   --   --  2.0    GFR: Estimated Creatinine Clearance: 42.2 mL/min (A) (by C-G formula based on SCr of 1.37 mg/dL (H)).  Liver Function Tests:  Recent Labs Lab 11/28/16 1456  AST 37  ALT 22  ALKPHOS 43  BILITOT 0.8  PROT 6.1*  ALBUMIN 2.8*   No results for input(s): LIPASE, AMYLASE in the last 168 hours. No results for input(s): AMMONIA in the last 168 hours.  Coagulation Profile: No results for input(s): INR, PROTIME in the last 168 hours.  Cardiac Enzymes:  Recent Labs Lab 11/21/16 2002  TROPONINI 11.40*    BNP (last 3 results) No results for input(s): PROBNP in the last 8760 hours.  HbA1C: No results for input(s): HGBA1C in the last 72 hours.  CBG:  Recent Labs Lab 11/24/16 1612 11/24/16 2119 11/25/16 0635 11/25/16 1117 11/28/16 1443  GLUCAP 252* 236* 215* 288* 208*    Lipid Profile: No results for input(s): CHOL,  HDL, LDLCALC, TRIG, CHOLHDL, LDLDIRECT in the last 72 hours.  Thyroid Function Tests: No results for input(s): TSH, T4TOTAL, FREET4, T3FREE, THYROIDAB in the last 72 hours.  Anemia Panel: No results for input(s): VITAMINB12, FOLATE, FERRITIN, TIBC, IRON, RETICCTPCT in the last 72 hours.  Urine analysis:    Component Value Date/Time   COLORURINE YELLOW 03/10/2016 1500   APPEARANCEUR CLOUDY (A) 03/10/2016 1500   LABSPEC 1.025 03/10/2016 1500    PHURINE 5.5 03/10/2016 1500   GLUCOSEU 500 (A) 03/10/2016 1500   HGBUR MODERATE (A) 03/10/2016 1500   BILIRUBINUR NEGATIVE 03/10/2016 1500   KETONESUR 15 (A) 03/10/2016 1500   PROTEINUR 30 (A) 03/10/2016 1500   UROBILINOGEN 0.2 06/16/2015 1134   NITRITE POSITIVE (A) 03/10/2016 1500   LEUKOCYTESUR MODERATE (A) 03/10/2016 1500    Sepsis Labs: Lactic Acid, Venous    Component Value Date/Time   LATICACIDVEN 2.03 (HH) 11/28/2016 1508     Microbiology: Recent Results (from the past 240 hour(s))  MRSA PCR Screening     Status: None   Collection Time: 11/19/16 12:27 PM  Result Value Ref Range Status   MRSA by PCR NEGATIVE NEGATIVE Final    Comment:        The GeneXpert MRSA Assay (FDA approved for NASAL specimens only), is one component of a comprehensive MRSA colonization surveillance program. It is not intended to diagnose MRSA infection nor to guide or monitor treatment for MRSA infections.       RADIOLOGIC STUDIES ON ADMISSION: Dg Chest Portable 1 View  Result Date: 11/28/2016 CLINICAL DATA:  Syncope EXAM: PORTABLE CHEST 1 VIEW COMPARISON:  11/19/2016 FINDINGS: Mild cardiomegaly. Loop recorder device projects over the anterior chest. No confluent opacities, effusions or edema. No acute bony abnormality. IMPRESSION: Mild cardiomegaly.  No active disease. Electronically Signed   By: Rolm Baptise M.D.   On: 11/28/2016 15:56    I have personally reviewed images of chest xray   EKG:  Personally reviewed. Partial right bundle-nonspecific ST changes-unchanged from prior.   ASSESSMENT AND PLAN: Presyncope: Due to transient hypotension-not sure if the hypotension is due to medications, or from autonomic dysfunction given her long-standing history of diabetes. There is no evidence of any infection at this time. We will decrease Imdur to 15 mg, cautiously continue with Coreg (holding parameters placed) and hold diuretics. Check orthostatic vital signs, ambulate with physical  therapy and monitor overnight. If hypotension reoccurs, will need a repeat echocardiogram in the morning-to reassess LV function and to see whether there is a pericardial effusion.  Recent STEMI status post PCI: Continue dual antiplatelet agents, Coreg, statin. Troponins elevated mildly-but significantly less than prior. We will continue to trend.  Insulin-dependent type 2 diabetes: Resume Lantus, add SSI. Follow.  History of CVA: Continue aspirin, statin-has mild left-sided deficits-that is unchanged per patient and family.  Chronic systolic heart failure (EF 20-25% by TTE on 3/15): Clinically compensated-blood pressure soft-hold diuretics for now.  Hypothyroidism: Continue Synthroid  Bronchial asthma: Lungs are clear, continue bronchodilators as needed.  Further plan will depend as patient's clinical course evolves and further radiologic and laboratory data become available. Patient will be monitored closely.  Above noted plan was discussed with patient/daughter face to face at bedside, they were in agreement.   CONSULTS: None  DVT Prophylaxis: Prophylactic Lovenox   Code Status: Full Code  Disposition Plan:  Discharge back to SNF possibly tomorrow, depending on clinical course  Admission status: Observation going to tele  Total time spent 45 minutes.Greater than 50%  of this time was spent in counseling, explanation of diagnosis, planning of further management, and coordination of care.  [facility] Pager [phone]  If 7PM-7AM, please contact night-coverage [email or web address] Password [facility] 11/28/2016, 5:44 PM

## 2016-11-29 DIAGNOSIS — I2510 Atherosclerotic heart disease of native coronary artery without angina pectoris: Secondary | ICD-10-CM

## 2016-11-29 DIAGNOSIS — R55 Syncope and collapse: Secondary | ICD-10-CM | POA: Diagnosis not present

## 2016-11-29 DIAGNOSIS — I63511 Cerebral infarction due to unspecified occlusion or stenosis of right middle cerebral artery: Secondary | ICD-10-CM | POA: Diagnosis not present

## 2016-11-29 DIAGNOSIS — I10 Essential (primary) hypertension: Secondary | ICD-10-CM | POA: Diagnosis not present

## 2016-11-29 LAB — BASIC METABOLIC PANEL
Anion gap: 9 (ref 5–15)
BUN: 25 mg/dL — ABNORMAL HIGH (ref 6–20)
CALCIUM: 8.8 mg/dL — AB (ref 8.9–10.3)
CO2: 19 mmol/L — ABNORMAL LOW (ref 22–32)
Chloride: 110 mmol/L (ref 101–111)
Creatinine, Ser: 1.04 mg/dL — ABNORMAL HIGH (ref 0.44–1.00)
GFR calc Af Amer: >60 mL/min (ref >60)
GFR, EST NON AFRICAN AMERICAN: 56 mL/min — AB (ref >60)
GLUCOSE: 215 mg/dL — AB (ref 65–99)
POTASSIUM: 3.7 mmol/L (ref 3.5–5.1)
Sodium: 138 mmol/L (ref 135–145)

## 2016-11-29 LAB — CBC
HCT: 36.8 % (ref 36.0–46.0)
Hemoglobin: 11.5 g/dL — ABNORMAL LOW (ref 12.0–15.0)
MCH: 29.6 pg (ref 26.0–34.0)
MCHC: 31.3 g/dL (ref 30.0–36.0)
MCV: 94.6 fL (ref 78.0–100.0)
PLATELETS: 262 10*3/uL (ref 150–400)
RBC: 3.89 MIL/uL (ref 3.87–5.11)
RDW: 14.8 % (ref 11.5–15.5)
WBC: 6.5 10*3/uL (ref 4.0–10.5)

## 2016-11-29 LAB — GLUCOSE, CAPILLARY
GLUCOSE-CAPILLARY: 231 mg/dL — AB (ref 65–99)
GLUCOSE-CAPILLARY: 219 mg/dL — AB (ref 65–99)
Glucose-Capillary: 240 mg/dL — ABNORMAL HIGH (ref 65–99)

## 2016-11-29 LAB — TROPONIN I
TROPONIN I: 0.23 ng/mL — AB (ref <0.03)
Troponin I: 0.26 ng/mL (ref <0.03)

## 2016-11-29 MED ORDER — FUROSEMIDE 20 MG PO TABS: 20.0000 mg | ORAL_TABLET | Freq: Every day | ORAL | Status: DC | PRN

## 2016-11-29 MED ORDER — HYDROCODONE-ACETAMINOPHEN 10-325 MG PO TABS: 1.0000 | ORAL_TABLET | Freq: Four times a day (QID) | ORAL | 0 refills | Status: DC | PRN

## 2016-11-29 MED ORDER — ISOSORBIDE MONONITRATE ER 30 MG PO TB24: 15.0000 mg | ORAL_TABLET | Freq: Every day | ORAL | Status: DC

## 2016-11-29 MED ORDER — INSULIN ASPART 100 UNIT/ML ~~LOC~~ SOLN: SUBCUTANEOUS | 11 refills | Status: DC

## 2016-11-29 NOTE — Progress Notes (Signed)
Discharge instructions reviewed with daughter and pt. Pt and daughter provided with time to verbalize concerns and ask questions. Pt and daughter verbalized understanding. IV and Tele box will  Remain on pt until transport is here to take pt back nursing facility.   Magali Bray M

## 2016-11-29 NOTE — Discharge Summary (Signed)
PATIENT DETAILS Name: Danielle Vargas Age: 66 y.o. Sex: female Date of Birth: Feb 04, 1951 MRN: [id number]. Admitting Physician: Kingsley Spittle, MD [id number], [name], MD  Admit Date: 11/28/2016 Discharge date: 11/29/2016  Recommendations for Outpatient Follow-up:  1. Follow up with PCP in 1-2 weeks 2. Please obtain BMP/CBC in one week 3. Please ensure follow up with Cardiology 4. Has soft BP-slowly reinitate anti-hypertensive 5. Changed Lasix to prn. :  Admitted From:  SNF  Disposition: SNF   Home Health: No  Equipment/Devices: None  Discharge Condition: Stable  CODE STATUS: FULL CODE  Diet recommendation:  Heart Healthy / Carb Modified  Brief Summary: See H&P, Labs, Consult and Test reports for all details in brief, Danielle Vargas is a 66 y.o. female with medical history significant of recent STEMI status post PCI on 1/47, chronic systolic heart failure (EF by TTE on 11/1518-25%), history of prior CVA, hypertension, insulin-dependent diabetes-who was just discharged from this facility by the cardiology service on 3/20 and sent to a skilled nursing facility-presented to the ED for an evaluation of transient decreased mentation associated with hypotension.   Brief Hospital Course: Presyncope: Due to transient hypotension-not sure if the hypotension is due to medications, or from autonomic dysfunction given her long-standing history of diabetes. Patient denied loss of consciousness. There is no evidence of any infection at this time. Telemetry negative for arrhythmias.Her most recent Echo on 3/15 showed EF of 20%. BP has been stable since admit-have changed lasix to prn, and cut Imdur to 15 mg daily, Coreg remains unchanged. Note bedside USG done by ED MD was negative for pericardial effusion.Also note, ED MD d/w cardiology-who was at bedside when patient arrived to the ED-Cardiology also reviewed EKG/Chart-and suggested medical admission for observation. Please ensure patient  follows with Cardiology-has upcoming appointment on 3/28  Recent STEMI status post PCI: Continue dual antiplatelet agents, Coreg, statin. Troponins elevated mildly-but significantly less than prior-and continues to downtrend.   Insulin-dependent type 2 diabetes:Relatively well controlled with Lantus, add SSI. Follow and optimize while at SNF  History of CVA: Continue aspirin, statin-has mild left-sided deficits-that is unchanged per patient and family.  Chronic systolic heart failure (EF 20-25% by TTE on 3/15): Clinically compensated-blood pressure stable-have changed lasix to prn. Will need to repeat Echo in the next few weeks to assess improvement in EF.  Hypothyroidism: Continue Synthroid  Bronchial asthma: Lungs are clear, continue bronchodilators as needed.  Hx of CVA with residual left sided weakness: remains essentially unchanged.   Hx of physical deconditioning/gen weakness: per patient she is mostly wheelchair bound at baseline-continue rehab services while at [facility].  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    Complete by:  As directed    Diet - low sodium heart healthy     Complete by:  As directed    Diet Carb Modified    Complete by:  As directed    Increase activity slowly    Complete by:  As directed      Allergies as of 11/29/2016      Reactions   Invokana [canagliflozin] Diarrhea   Nsaids Other (See Comments)   GI problems/ stomach pain   Penicillins Rash   Has patient had a PCN reaction causing immediate rash, facial/tongue/throat swelling, SOB or lightheadedness with hypotension: No (rash on legs after 3 days [place] -June 2016) Has patient had a PCN reaction causing severe rash involving mucus membranes or skin necrosis: No Has patient had a PCN reaction that required hospitalization No Has patient had a PCN reaction occurring within the last 10 years: Yes If all of the above answers are "NO", then may proceed with Cephalosporin use.      Medication List    STOP taking these medications   clopidogrel 75 MG tablet Commonly known as:  PLAVIX   HUMALOG KWIKPEN 100 UNIT/ML KiwkPen Generic drug:  insulin lispro     TAKE these medications   acetaminophen 325 MG tablet Commonly known as:  TYLENOL Take 2 tablets (650 mg total) by mouth every 6 (six) hours as needed for mild pain (or temp >/= 99.5 F).   albuterol 108 (90 Base) MCG/ACT inhaler Commonly known as:  PROVENTIL HFA;VENTOLIN HFA Inhale 2 puffs into the lungs every 6 (six) hours as needed for wheezing or shortness of breath.   aspirin 81 MG EC tablet Take 1 tablet (81 mg total) by mouth daily.   carvedilol 3.125 MG tablet Commonly known as:  COREG Take 1 tablet by mouth 2 (two) times daily.   docusate sodium 100 MG capsule Commonly known as:  COLACE Take 1 capsule (100 mg total) by mouth 2 (two) times daily.   fenofibrate 160 MG tablet Take 160 mg by mouth at bedtime.   FLUoxetine 10 MG capsule Commonly known as:  PROZAC Take 1 capsule (10 mg total) by mouth daily.   furosemide 20 MG tablet Commonly known as:  LASIX Take 1 tablet (20 mg total) by mouth daily as needed (if  weight >200 lb). What changed:  how much to take  when to take this  reasons to take this   gabapentin 300 MG capsule Commonly known as:  NEURONTIN Take 300 mg by mouth 3 (three) times daily.   HYDROcodone-acetaminophen 10-325 MG tablet Commonly known as:  NORCO Take 1 tablet by mouth every 6 (six) hours as needed. What changed:  how to take this   insulin aspart 100 UNIT/ML injection Commonly known as:  novoLOG 0-9 Units, Subcutaneous, 3 times daily with meals CBG < 70: implement hypoglycemia protocol-call MD CBG 70 - 120: 0 units CBG 121 - 150: 1 unit CBG 151 - 200: 2 units CBG 201 - 250: 3 units CBG 251 - 300: 5 units CBG 301 - 350: 7 units CBG 351 - 400: 9 units CBG > 400: call MD What changed:  how much to take  how to take this  when to take this  additional instructions   insulin glargine 100 UNIT/ML injection Commonly known as:  LANTUS Inject 30 units in am and 20 units at bedtime. What changed:  how to take this  additional instructions   isosorbide mononitrate  30 MG 24 hr tablet Commonly known as:  IMDUR Take 0.5 tablets (15 mg total) by mouth daily. What changed:  how much to take   levothyroxine 100 MCG tablet Commonly known as:  SYNTHROID, LEVOTHROID Take 1 tablet (100 mcg total) by mouth daily before breakfast.   metoCLOPramide 10 MG tablet Commonly known as:  REGLAN Take 10 mg by mouth 4 (four) times daily.   mometasone 50 MCG/ACT nasal spray Commonly known as:  NASONEX Place 2 sprays into the nose daily.   mometasone-formoterol 200-5 MCG/ACT Aero Commonly known as:  DULERA Inhale 2 puffs into the lungs 2 (two) times daily.   montelukast 10 MG tablet Commonly known as:  SINGULAIR Take 10 mg by mouth at bedtime.   multivitamin with minerals Tabs tablet Take 1 tablet by mouth daily. One A Day 50+   MUSCLE RUB 10-15 % Crea Apply 1 application topically 2 (two) times daily as needed for muscle pain. For right hip pain   nitroGLYCERIN  0.4 MG SL tablet Commonly known as:  NITROSTAT Place 1 tablet (0.4 mg total) under the tongue every 5 (five) minutes as needed for chest pain.   pantoprazole 40 MG tablet Commonly known as:  PROTONIX Take 1 tablet (40 mg total) by mouth daily.   polyethylene glycol packet Commonly known as:  MIRALAX / GLYCOLAX Take 17 g by mouth every other day.   rosuvastatin 20 MG tablet Commonly known as:  CRESTOR Take 1 tablet (20 mg total) by mouth daily. What changed:  when to take this   ticagrelor 90 MG Tabs tablet Commonly known as:  BRILINTA Take 1 tablet (90 mg total) by mouth 2 (two) times daily.   tiZANidine 2 MG tablet Commonly known as:  ZANAFLEX Take 2 mg by mouth 3 (three) times daily.   topiramate 50 MG tablet Commonly known as:  TOPAMAX Take 50 mg by mouth 2 (two) times daily.      Follow-up Information    Marton Redwood, MD. Schedule an appointment as soon as possible for a visit in 1 week(s).   Specialty:  Internal Medicine Contact information: [address] [place] [address] [place], NP Follow up on 12/03/2016.   Specialties:  Nurse Practitioner, Interventional Cardiology, Cardiology, Radiology Why:  appointment at 11 am Contact information: [address] [place] [address] [phone]          Allergies  Allergen Reactions  . Invokana [Canagliflozin] Diarrhea  . Nsaids Other (See Comments)    GI problems/ stomach pain  . Penicillins Rash    Has patient had a PCN reaction causing immediate rash, facial/tongue/throat swelling, SOB or lightheadedness with hypotension: No (rash on legs after 3 days [place] -June 2016) Has patient had a PCN reaction causing severe rash involving mucus membranes or skin necrosis: No Has patient had a PCN reaction that required hospitalization No Has patient had a PCN reaction occurring within the last 10 years: Yes If all of the above answers are "NO", then may proceed with  Cephalosporin use.    Consultations:   None  Other Procedures/Studies: Dg Chest Portable 1 View  Result Date: 11/28/2016 CLINICAL DATA:  Syncope EXAM: PORTABLE CHEST 1 VIEW COMPARISON:  11/19/2016 FINDINGS: Mild cardiomegaly. Loop recorder device projects over the anterior chest. No confluent opacities, effusions or edema. No acute bony abnormality. IMPRESSION: Mild cardiomegaly.  No active disease. Electronically Signed   By: Rolm Baptise M.D.  On: 11/28/2016 15:56   Dg Chest Portable 1 View  Result Date: 11/19/2016 CLINICAL DATA:  Cough and chest pain EXAM: PORTABLE CHEST 1 VIEW COMPARISON:  March 10, 2016 FINDINGS: There is mild atelectatic change in the left base. Lungs elsewhere clear. Heart is borderline prominent with pulmonary vascularity within normal limits. No adenopathy. There is a loop recorder just to the right of midline. There is postoperative change in the lower cervical spine. There is evidence of old trauma involving the lateral left clavicle. No pneumothorax. IMPRESSION: Mild atelectatic change in the left base. No edema or consolidation. Heart borderline prominent, stable. Electronically Signed   By: Lowella Grip III M.D.   On: 11/19/2016 07:30     TODAY-DAY OF DISCHARGE:  Subjective:   Lexis Potenza today has no headache,no chest abdominal pain,no new weakness tingling or numbness, feels much better wants to go home today.   Objective:   Blood pressure (!) 143/65, pulse 83, temperature 97.8 F (36.6 C), temperature source Oral, resp. rate 18, height 5\' 1"  (1.549 m), weight 90.3 kg (199 lb 1.6 oz), SpO2 99 %.  Intake/Output Summary (Last 24 hours) at 11/29/16 1035 Last data filed at 11/29/16 0842  Gross per 24 hour  Intake             1240 ml  Output                0 ml  Net             1240 ml   Filed Weights   11/28/16 2027  Weight: 90.3 kg (199 lb 1.6 oz)    Exam: Awake Alert, Oriented *3, No new F.N deficits, Normal affect [place].AT,PERRAL Supple  Neck,No JVD, No cervical lymphadenopathy appriciated.  Symmetrical Chest wall movement, Good air movement bilaterally, CTAB RRR,No Gallops,Rubs or new Murmurs, No Parasternal Heave +ve B.Sounds, Abd Soft, Non tender, No organomegaly appriciated, No rebound -guarding or rigidity. No Cyanosis, Clubbing or edema, No new Rash or bruise   PERTINENT RADIOLOGIC STUDIES: Dg Chest Portable 1 View  Result Date: 11/28/2016 CLINICAL DATA:  Syncope EXAM: PORTABLE CHEST 1 VIEW COMPARISON:  11/19/2016 FINDINGS: Mild cardiomegaly. Loop recorder device projects over the anterior chest. No confluent opacities, effusions or edema. No acute bony abnormality. IMPRESSION: Mild cardiomegaly.  No active disease. Electronically Signed   By: Rolm Baptise M.D.   On: 11/28/2016 15:56   Dg Chest Portable 1 View  Result Date: 11/19/2016 CLINICAL DATA:  Cough and chest pain EXAM: PORTABLE CHEST 1 VIEW COMPARISON:  March 10, 2016 FINDINGS: There is mild atelectatic change in the left base. Lungs elsewhere clear. Heart is borderline prominent with pulmonary vascularity within normal limits. No adenopathy. There is a loop recorder just to the right of midline. There is postoperative change in the lower cervical spine. There is evidence of old trauma involving the lateral left clavicle. No pneumothorax. IMPRESSION: Mild atelectatic change in the left base. No edema or consolidation. Heart borderline prominent, stable. Electronically Signed   By: Lowella Grip III M.D.   On: 11/19/2016 07:30     PERTINENT LAB RESULTS: CBC:  Recent Labs  11/28/16 2002 11/29/16 0737  WBC 8.4 6.5  HGB 11.8* 11.5*  HCT 37.1 36.8  PLT 267 262   CMET CMP     Component Value Date/Time   NA 138 11/29/2016 0737   K 3.7 11/29/2016 0737   CL 110 11/29/2016 0737   CO2 19 (L) 11/29/2016 0737   GLUCOSE 215 (H)  11/29/2016 0737   BUN 25 (H) 11/29/2016 0737   CREATININE 1.04 (H) 11/29/2016 0737   CALCIUM 8.8 (L) 11/29/2016 0737   PROT 6.1  (L) 11/28/2016 1456   ALBUMIN 2.8 (L) 11/28/2016 1456   AST 37 11/28/2016 1456   ALT 22 11/28/2016 1456   ALKPHOS 43 11/28/2016 1456   BILITOT 0.8 11/28/2016 1456   GFRNONAA 56 (L) 11/29/2016 0737   GFRAA >60 11/29/2016 0737    GFR Estimated Creatinine Clearance: 55.9 mL/min (A) (by C-G formula based on SCr of 1.04 mg/dL (H)). No results for input(s): LIPASE, AMYLASE in the last 72 hours.  Recent Labs  11/28/16 2002 11/29/16 0114 11/29/16 0737  TROPONINI 0.33* 0.26* 0.23*   Invalid input(s): POCBNP No results for input(s): DDIMER in the last 72 hours. No results for input(s): HGBA1C in the last 72 hours. No results for input(s): CHOL, HDL, LDLCALC, TRIG, CHOLHDL, LDLDIRECT in the last 72 hours. No results for input(s): TSH, T4TOTAL, T3FREE, THYROIDAB in the last 72 hours.  Invalid input(s): FREET3 No results for input(s): VITAMINB12, FOLATE, FERRITIN, TIBC, IRON, RETICCTPCT in the last 72 hours. Coags: No results for input(s): INR in the last 72 hours.  Invalid input(s): PT Microbiology: Recent Results (from the past 240 hour(s))  MRSA PCR Screening     Status: None   Collection Time: 11/19/16 12:27 PM  Result Value Ref Range Status   MRSA by PCR NEGATIVE NEGATIVE Final    Comment:        The GeneXpert MRSA Assay (FDA approved for NASAL specimens only), is one component of a comprehensive MRSA colonization surveillance program. It is not intended to diagnose MRSA infection nor to guide or monitor treatment for MRSA infections.     FURTHER DISCHARGE INSTRUCTIONS:  Get Medicines reviewed and adjusted: Please take all your medications with you for your next visit with your Primary MD  Laboratory/radiological data: Please request your Primary MD to go over all hospital tests and procedure/radiological results at the follow up, please ask your Primary MD to get all Hospital records sent to his/her office.  In some cases, they will be blood work, cultures and  biopsy results pending at the time of your discharge. Please request that your primary care M.D. goes through all the records of your hospital data and follows up on these results.  Also Note the following: If you experience worsening of your admission symptoms, develop shortness of breath, life threatening emergency, suicidal or homicidal thoughts you must seek medical attention immediately by calling 911 or calling your MD immediately  if symptoms less severe.  You must read complete instructions/literature along with all the possible adverse reactions/side effects for all the Medicines you take and that have been prescribed to you. Take any new Medicines after you have completely understood and accpet all the possible adverse reactions/side effects.   Do not drive when taking Pain medications or sleeping medications (Benzodaizepines)  Do not take more than prescribed Pain, Sleep and Anxiety Medications. It is not advisable to combine anxiety,sleep and pain medications without talking with your primary care practitioner  Special Instructions: If you have smoked or chewed Tobacco  in the last 2 yrs please stop smoking, stop any regular Alcohol  and or any Recreational drug use.  Wear Seat belts while driving.  Please note: You were cared for by a hospitalist during your hospital stay. Once you are discharged, your primary care physician will handle any further medical issues. Please note that NO REFILLS for  any discharge medications will be authorized once you are discharged, as it is imperative that you return to your primary care physician (or establish a relationship with a primary care physician if you do not have one) for your post hospital discharge needs so that they can reassess your need for medications and monitor your lab values.  Total Time spent coordinating discharge including counseling, education and face to face time equals 35 minutes.  SignedOren Binet 11/29/2016 10:35  AM

## 2016-11-29 NOTE — Progress Notes (Signed)
CSW was contacted to discharge an observation patient. Clinical Social Worker facilitated patient discharge including contacting patient family and facility to confirm patient discharge plans.  Clinical information faxed to facility and family agreeable with plan.  CSW arranged ambulance transport via [facility] to [facility] .  RN Cherese to call [phone] and ask for the nurse that has room 705 B for report prior to discharge.  Clinical Social Worker will sign off for now as social work intervention is no longer needed. Please consult [place] again if new need arises.  Rhea Pink, MSW, [place]

## 2016-11-29 NOTE — Evaluation (Addendum)
Physical Therapy Evaluation Patient Details Name: Danielle Vargas MRN: [id number] DOB: 06-Aug-1951 Today's Date: 11/29/2016   History of Present Illness  Patient is a 66 yo female admitted 11/28/16 with pre-syncope episode due to hypotension.  Patient with recent admission for STEMI s/p PCI on 11/19/16, and d/c back to [facility] on 11/25/16.    PMH:  CAD, STEMI, s/p PCI, CHF, EF 20-25%, CVA with Lt hemiparesis, HTN, DM, asthma, sleep apnea with CPAP at night.  Clinical Impression  Patient presents with problems listed below.  Patient to return to SNF today per chart.  Recommend patient continue PT services at SNF at d/c.  Acute PT will sign off.    Follow Up Recommendations SNF;Supervision/Assistance - 24 hour    Equipment Recommendations  None recommended by PT    Recommendations for Other Services       Precautions / Restrictions Precautions Precautions: Fall Precaution Comments: Lt hemiparesis Restrictions Weight Bearing Restrictions: No      Mobility  Bed Mobility Overal bed mobility: Needs Assistance Bed Mobility: Rolling Rolling: Mod assist;Max assist         General bed mobility comments: Patient rolling to both sides to be cleaned - incontinent.  Able to roll to Lt side with use of RLE pushing on bed, and RUE on rail with mod assist at hips.  Patient required max assist to roll to right.  Unable to use LUE/LLE to assist.  Requires assist to initiate at shoulders, and to bring hips forward.   Patient declined further mobility.  Transfers                 General transfer comment: NT  Ambulation/Gait                Stairs            Wheelchair Mobility    Modified Rankin (Stroke Patients Only)       Balance                                             Pertinent Vitals/Pain Pain Assessment: Faces Faces Pain Scale: Hurts little more Pain Location: LUE especially hand; chronic pain back and LLE Pain Descriptors /  Indicators: Aching;Sore Pain Intervention(s): Limited activity within patient's tolerance;Monitored during session;Repositioned    Home Living Family/patient expects to be discharged to:: Skilled nursing facility                 Additional Comments: pt from [facility]    Prior Function Level of Independence: Needs assistance   Gait / Transfers Assistance Needed: Patient requires assist with transfers.  Started working on Information systems manager in [place] with PT.  Primarily uses w/c for mobility.  ADL's / Homemaking Assistance Needed: dependent with ADLs        Hand Dominance   Dominant Hand: Right    Extremity/Trunk Assessment   Upper Extremity Assessment Upper Extremity Assessment: LUE deficits/detail LUE Deficits / Details: Patient with residual weakness from CVA.  Trace movement noted finger flexion and elbow flexion.    Lower Extremity Assessment Lower Extremity Assessment: LLE deficits/detail LLE Deficits / Details: pt with residual weakness from CVA, minimal active movement at knee into extension       Communication   Communication: No difficulties  Cognition Arousal/Alertness: Awake/alert Behavior During Therapy: Flat affect Overall Cognitive Status: Impaired/Different from baseline Area of Impairment: Attention;Problem solving;Following  commands                   Current Attention Level: Sustained   Following Commands: Follows one step commands with increased time     Problem Solving: Slow processing;Decreased initiation;Difficulty sequencing;Requires verbal cues;Requires tactile cues General Comments: Requires repeated cues to remain on task.      General Comments      Exercises     Assessment/Plan    PT Assessment All further PT needs can be met in the next venue of care  PT Problem List Decreased strength;Decreased activity tolerance;Decreased balance;Decreased mobility;Decreased cognition;Impaired tone;Pain       PT Treatment Interventions Other  (comment) (f/u at SNF)    PT Goals (Current goals can be found in the Care Plan section)  Acute Rehab PT Goals Patient Stated Goal: none stated PT Goal Formulation: All assessment and education complete, DC therapy (to f/u at SNF)    Frequency Other (Comment) (f/u at SNF)   Barriers to discharge        Co-evaluation               End of Session   Activity Tolerance: Patient tolerated treatment well Patient left: in bed;with call bell/phone within reach;with bed alarm set   PT Visit Diagnosis: Other abnormalities of gait and mobility (R26.89);Hemiplegia and hemiparesis;Pain Hemiplegia - Right/Left: Left Hemiplegia - dominant/non-dominant: Non-dominant Hemiplegia - caused by: Cerebral infarction Pain - Right/Left: Left Pain - part of body: Arm;Leg (back)    Time: 2376-2831 PT Time Calculation (min) (ACUTE ONLY): 21 min   Charges:   PT Evaluation $PT Eval Moderate Complexity: 1 Procedure     PT G Codes:   PT G-Codes **NOT FOR INPATIENT CLASS** Functional Assessment Tool Used: AM-PAC 6 Clicks Basic Mobility;Clinical judgement Functional Limitation: Changing and maintaining body position Changing and Maintaining Body Position Current Status (D1761): At least 60 percent but less than 80 percent impaired, limited or restricted Changing and Maintaining Body Position Goal Status (Y0737): At least 60 percent but less than 80 percent impaired, limited or restricted Changing and Maintaining Body Position Discharge Status [phone]): At least 60 percent but less than 80 percent impaired, limited or restricted    Carita Pian. Sanjuana Kava, [facility] Acute Rehab Services Pager [phone]   Despina Pole 11/29/2016, 12:27 PM

## 2016-11-29 NOTE — Progress Notes (Signed)
Report called to Marcie Bal at pt's SNF.  Danielle Vargas M

## 2016-12-02 NOTE — Progress Notes (Addendum)
CARDIOLOGY OFFICE NOTE  Date:  12/03/2016    [name] Date of Birth: Dec 06, 1950 Medical Record #[id number]  PCP:  Marton Redwood, MD  Cardiologist:  Tamala Julian    Chief Complaint  Patient presents with  . Cardiomyopathy    Post hospital visit - seen for Dr. Tamala Julian    History of Present Illness: Danielle Vargas is a 66 y.o. female who presents today for a post hospital visit. This was to be a TOC - however no phone call noted. Seen for Dr. Tamala Julian.   Has not seen Dr. Tamala Julian since February of 2016. Last visit here with Richardson Dopp, PA back in September of 2016.   She has a hx of CAD, ischemic cardiomyopathy, OSA, prior CVA, IDDM, HTN, & HLD. 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 This was treated with angioplasty. EF was 35-40% at the time. Follow-up echo 5/15 demonstrated improved LV function with an EF of 55%. Last seen by Dr. Tamala Julian 10/2014. Plavix was discontinued. Beta blocker dose was reduced previously secondary to low blood pressure. She has also had her Norvasc stopped due to symptomatic low BP.    Has had 2 admissions this month. Discharged on 3/20 after having STEMI.  Cardiac catheterization by Dr. Burt Knack showed a thrombotic occlusion of the distal LCx which was treated with balloon angioplasty. Minor nonobstructive disease was noted along the RCA, LM, and LAD. EF was reduced at 25-30%. She was started on ASA and Brilinta and is to remain on this for at least 12 months. She initially required BiPAP following the procedure but was changed over to nasal cannula the following day. Continued to diurese well with IV Lasix. Troponin values peaked at 51.68. Echo was performed on 3/15 and showed an EF of 20-25% with severely reduced with apical ballooning and akinesis of the mid and apical anterior, inferolateral, inferior, anterior myocardiumas well as akinesis of the  apical, mid inferoseptal and anteroseptal walls.  She continued to diurese over the next several days with IV Lasix. As she was diuresed, creatinine trended upwards. Creatinine peaked at 2.06 on 3/19, but improved to 1.51 the following day. Lisinopril 2.5mg  daily and Spironolactone 12.5mg  daily were added on 3/17 due to her reduced EF but were later discontinued secondary to her AKI and hypotension. She was to continue on Coreg 3.125mg  BID and Imdur 30mg  daily along with her ASA and Brilinta. Will restart low-dose PTA Lasix 10mg  daily.   Then sent to SNF. But readmitted just 3 days later - had had transient hypotension. Unclear if this is due to medications or autonomic dysfunction given long standing DM. Her medicines were cut back further.   Comes in today. Here with an aide from [place]. She has only been back to the SNF 4 days. Says she is going to live there forever. She loves it there. BP soft. She does not wish to be on any more medicine. She says she is doing ok. No chest pain. Breathing is stable. She hopes to get some therapy but is basically wheelchair bound. She wants off her oxygen. No presyncopal spells. She tells me her BP is being checked but no record sent. On very little CHF meds.   Past Medical History:  Diagnosis Date  . Acute combined systolic and diastolic heart failure ([place]) 11/19/2016  . Allergy   . Asthma   . CAD (coronary  artery disease)    a. s/p lateral STEMI in 2015 2ry to occlusion of OM1 thought to be caused by coronary artery dissection, treated with POBA. b. 11/2016: STEMI with thrombotic occlusion of distal LCx which was treated with balloon angioplasty.   . Cataract    "just beginning"  . Colon polyps 7/07   hyperplastic and adenomatous  . Depression    pt unsure, psychologist said no  . Diverticulosis   . DM (diabetes mellitus) ([place])   . Dyslipidemia   . External hemorrhoid   . Fatty liver   . Gastroparesis   . HTN (hypertension)   . Hyperlipidemia   .  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 Hypothyroidism   . Migraine   . Myocardial infarction    FEb 2015  . Nephrolithiasis   . Obesity   . Osteoporosis   . Sleep apnea    wears CPAP  . ST elevation myocardial infarction (STEMI) of inferolateral wall ([place]) 11/19/2016  . Stroke [facility])     Past Surgical History:  Procedure Laterality Date  . BREAST REDUCTION SURGERY  '88  . CARPAL TUNNEL RELEASE  2000   right wrist  . CERVICAL DISC SURGERY  '98   fusion  . CESAREAN SECTION  '79  '84  . COLONOSCOPY    . CORONARY BALLOON ANGIOPLASTY N/A 11/19/2016   Procedure: Coronary Balloon Angioplasty;  Surgeon: Sherren Mocha, MD;  Location: [place] [facility];  Service: Cardiovascular;  Laterality: N/A;  . EP IMPLANTABLE DEVICE N/A 03/12/2016   Procedure: Loop Recorder Insertion;  Surgeon: Will Meredith Leeds, MD;  Location: [place] [facility];  Service: Cardiovascular;  Laterality: N/A;  . LEFT HEART CATH AND CORONARY ANGIOGRAPHY N/A 11/19/2016   Procedure: Left Heart Cath and Coronary Angiography;  Surgeon: Sherren Mocha, MD;  Location: [place] [facility];  Service: Cardiovascular;  Laterality: N/A;  . LEFT HEART CATHETERIZATION WITH CORONARY ANGIOGRAM N/A 10/26/2013   Procedure: LEFT HEART CATHETERIZATION WITH CORONARY ANGIOGRAM;  Surgeon: Sinclair Grooms, MD;  Location: [facility] CATH LAB;  Service: Cardiovascular;  Laterality: N/A;  . ROTATOR CUFF REPAIR  '09   left shoulder  . TEE WITHOUT CARDIOVERSION N/A 03/12/2016   Procedure: TRANSESOPHAGEAL ECHOCARDIOGRAM (TEE);  Surgeon: Sanda Klein, MD;  Location: [facility] ENDOSCOPY;  Service: Cardiovascular;  Laterality: N/A;  . TUBAL LIGATION  '85  . ULNAR NERVE REPAIR  '02, '08   left done, then right     Medications: Current Outpatient Prescriptions  Medication Sig Dispense Refill  . albuterol (PROVENTIL HFA;VENTOLIN HFA) 108 (90 BASE) MCG/ACT inhaler Inhale 2 puffs into the lungs every 6 (six) hours as needed for wheezing or shortness of breath.       Marland Kitchen aspirin EC 81 MG EC tablet Take 1 tablet (81 mg total) by mouth daily.    . carvedilol (COREG) 3.125 MG tablet Take 1 tablet by mouth 2 (two) times daily.    Marland Kitchen docusate sodium (COLACE) 100 MG capsule Take 1 capsule (100 mg total) by mouth 2 (two) times daily. 10 capsule 0  . fenofibrate 160 MG tablet Take 160 mg by mouth at bedtime.     Marland Kitchen FLUoxetine (PROZAC) 10 MG capsule Take 1 capsule (10 mg total) by mouth daily.  3  . furosemide (LASIX) 20 MG tablet Take 1 tablet (20 mg total) by mouth daily as needed (if weight >200 lb). 30 tablet   . gabapentin (NEURONTIN) 300 MG capsule Take 300 mg by mouth 3 (three) times daily.    Marland Kitchen  HUMALOG KWIKPEN 100 UNIT/ML KiwkPen Inject 30 Units into the skin 2 (two) times daily.     Marland Kitchen HYDROcodone-acetaminophen (NORCO) 10-325 MG tablet Take 1 tablet by mouth every 6 (six) hours as needed. 20 tablet 0  . insulin aspart (NOVOLOG) 100 UNIT/ML injection 0-9 Units, Subcutaneous, 3 times daily with meals CBG < 70: implement hypoglycemia protocol-call MD CBG 70 - 120: 0 units CBG 121 - 150: 1 unit CBG 151 - 200: 2 units CBG 201 - 250: 3 units CBG 251 - 300: 5 units CBG 301 - 350: 7 units CBG 351 - 400: 9 units CBG > 400: call MD 10 mL 11  . insulin glargine (LANTUS) 100 UNIT/ML injection Inject 30 units in am and 20 units at bedtime. (Patient taking differently: Inject into the skin. Inject 30 units in am and 20 units at bedtime.)    . isosorbide mononitrate (IMDUR) 30 MG 24 hr tablet Take 0.5 tablets (15 mg total) by mouth daily.    Marland Kitchen levothyroxine (SYNTHROID, LEVOTHROID) 100 MCG tablet Take 1 tablet (100 mcg total) by mouth daily before breakfast.    . Menthol-Methyl Salicylate (MUSCLE RUB) 10-15 % CREA Apply 1 application topically 2 (two) times daily as needed for muscle pain. For right hip pain  0  . metoCLOPramide (REGLAN) 10 MG tablet Take 10 mg by mouth 4 (four) times daily.     . mometasone (NASONEX) 50 MCG/ACT nasal spray Place 2 sprays into the nose  daily. 17 g 12  . mometasone-formoterol (DULERA) 200-5 MCG/ACT AERO Inhale 2 puffs into the lungs 2 (two) times daily.    . montelukast (SINGULAIR) 10 MG tablet Take 10 mg by mouth at bedtime.    . Multiple Vitamin (MULTIVITAMIN WITH MINERALS) TABS tablet Take 1 tablet by mouth daily. One A Day 50+    . nitroGLYCERIN (NITROSTAT) 0.4 MG SL tablet Place 1 tablet (0.4 mg total) under the tongue every 5 (five) minutes as needed for chest pain. 25 tablet 3  . OXYGEN Inhale 1 L into the lungs continuous.    . pantoprazole (PROTONIX) 40 MG tablet Take 1 tablet (40 mg total) by mouth daily.    . polyethylene glycol (MIRALAX / GLYCOLAX) packet Take 17 g by mouth every other day.     . rosuvastatin (CRESTOR) 20 MG tablet Take 1 tablet (20 mg total) by mouth daily. (Patient taking differently: Take 20 mg by mouth at bedtime. )    . ticagrelor (BRILINTA) 90 MG TABS tablet Take 1 tablet (90 mg total) by mouth 2 (two) times daily. 60 tablet   . tiZANidine (ZANAFLEX) 2 MG tablet Take 2 mg by mouth 3 (three) times daily.     Marland Kitchen topiramate (TOPAMAX) 50 MG tablet Take 50 mg by mouth 2 (two) times daily.    Marland Kitchen acetaminophen (TYLENOL) 325 MG tablet Take 2 tablets (650 mg total) by mouth every 6 (six) hours as needed for mild pain (or temp >/= 99.5 F).     No current facility-administered medications for this visit.     Allergies: Allergies  Allergen Reactions  . Invokana [Canagliflozin] Diarrhea  . Nsaids Other (See Comments)    GI problems/ stomach pain  . Penicillins Rash    Has patient had a PCN reaction causing immediate rash, facial/tongue/throat swelling, SOB or lightheadedness with hypotension: No (rash on legs after 3 days [place] -June 2016) Has patient had a PCN reaction causing severe rash involving mucus membranes or skin necrosis: No Has patient had a  PCN reaction that required hospitalization No Has patient had a PCN reaction occurring within the last 10 years: Yes If all of the above answers are  "NO", then may proceed with Cephalosporin use.    Social History: The patient  reports that she has never smoked. She has never used smokeless tobacco. She reports that she does not drink alcohol or use drugs.   Family History: The patient's family history includes COPD in her father; Cirrhosis in her sister; Colon cancer (age of onset: 28) in her paternal aunt; Diabetes in her brother and mother; Emphysema in her father; Heart attack in her brother, mother, and paternal grandfather; Heart failure in her brother and mother; Hypertension in her brother; Kidney disease in her mother; Liver disease in her sister; Lung cancer in her brother; Stroke in her father.   Review of Systems: Please see the history of present illness.   Otherwise, the review of systems is positive for none.   All other systems are reviewed and negative.   Physical Exam: VS:  BP (!) 120/52   Pulse 86   Ht 5\' 1"  (1.549 m)   SpO2 100% Comment: with 1 L of 02 .  BMI There is no height or weight on file to calculate BMI.  Wt Readings from Last 3 Encounters:  11/28/16 199 lb 1.6 oz (90.3 kg)  11/25/16 197 lb 1.6 oz (89.4 kg)  09/16/16 197 lb (89.4 kg)    General: Chronically ill appearing - she looks older than her stated age. She is alert and in no acute distress.  Has oxygen in place. She is in a wheelchair. A sling is positioned under her for transport. She is not able to stand.  HEENT: Normal. Eyes watery.  Neck: Supple, no JVD, carotid bruits, or masses noted.  Cardiac: Heart tones are quite distant. Trace edema.  Respiratory:  Decreased breath sounds due to body habitus with normal work of breathing.  GI: Soft and nontender.  MS: No deformity or atrophy. Gait not tested Skin: Warm and dry. Color is normal.  Neuro:  Strength and sensation are intact and no gross focal deficits noted.  Psych: Alert, appropriate and with normal affect.   LABORATORY DATA:  EKG:  EKG is not ordered today.   Lab Results    Component Value Date   WBC 6.5 11/29/2016   HGB 11.5 (L) 11/29/2016   HCT 36.8 11/29/2016   PLT 262 11/29/2016   GLUCOSE 215 (H) 11/29/2016   CHOL 117 11/20/2016   TRIG 225 (H) 11/20/2016   HDL 23 (L) 11/20/2016   LDLCALC 49 11/20/2016   ALT 22 11/28/2016   AST 37 11/28/2016   NA 138 11/29/2016   K 3.7 11/29/2016   CL 110 11/29/2016   CREATININE 1.04 (H) 11/29/2016   BUN 25 (H) 11/29/2016   CO2 19 (L) 11/29/2016   TSH 10.113 (H) 03/11/2016   INR 1.13 11/19/2016   HGBA1C 6.6 (H) 11/19/2016    BNP (last 3 results)  Recent Labs  11/19/16 1028 11/21/16 0817 11/28/16 1452  BNP 275.0* 726.9* 797.4*    ProBNP (last 3 results) No results for input(s): PROBNP in the last 8760 hours.   Other Studies Reviewed Today:  Echo Study Conclusions 11/2016  - Left ventricle: The cavity size was normal. Systolic function was   severely reduced with apical ballooning and akinesis of the mid   and apical anterior, inferolateral, inferior, anterior myocardium   as well as akinesis of the apical, mid inferoseptal  and   anteroseptal walls. The estimated ejection fraction was in the   range of 20% to 25%. This is consistent with stress   cardiomyopathy. There is akinesis of the mid-apicalanterior,   inferolateral, and inferior myocardium. There is akinesis of the   entire lateral, mid inferoseptal, apical septal, mid anteroseptal   and entire apical myocardium. - Right ventricle: Poorly visualized. Pacer wire or catheter noted   in right ventricle. - Pulmonary arteries: Systolic pressure could not be accurately   estimated.  Procedures   Coronary Balloon Angioplasty 11/2016  Left Heart Cath and Coronary Angiography  Conclusion   1. Acute inferolateral STEMI secondary to thrombotic occlusion of the distal left circumflex, treated successfully with balloon angioplasty 2. Acute systolic heart failure with markedly elevated LVEDP, improved with IV nitroglycerin, IV Lasix, and  BiPAP 3. Minor nonobstructive disease in the RCA, left main, and LAD 4. Severe LV systolic dysfunction with hyperkinetic basal segments and dyskinesis of the anterolateral, periapical, and infero-apical walls, LVEF estimated at 25-30%  Recommendations:  Aspirin and brilinta (dual antiplatelet therapy) 12 months  Continue BiPAP and IV nitroglycerin. Wean BiPAP as tolerated  Start low-dose beta blocker as tolerated  ICU admission     Assessment/Plan:  Presyncope: has had issues with chronic hypotension which limits ability to have on adequate CHF regimen. BP ok today - she does not wish to have her medicines changed at this time.   Recent STEMI status post PCI: No active chest pain. Remains on DAPT. Lab today.   Insulin-dependent type 2 diabetes: Followed by house MD  History of CVA: Continue aspirin, statin-has mild left-sided deficits-that is unchanged per patient   Ischemic CM with chronic systolic heart failure (EF 20-25% by TTE on 3/15):  She is on a very limited regimen due to chronic hypotension. Fortunately, no symptoms. Prognosis guarded at best. Will arrange for limited echo in about 3 months. Needs daily BP check and review on return but doubtful we will be able to maximize her regimen.    Hypothyroidism: Continue Synthroid - no recent TSH - last one was over 10 - rechecking today.   Hx of physical deconditioning/gen weakness: per patient she is mostly wheelchair bound at baseline-continue rehab services while at [facility].  HLD - on statin therapy.   Loop recorder in place - looks like no recent transmission - checked with EP - monitor is now working per [employer] .    Current medicines are reviewed with the patient today.  The patient does not have concerns regarding medicines other than what has been noted above.  The following changes have been made:  See above.  Labs/ tests ordered today include:    Orders Placed This Encounter  Procedures  . Basic  metabolic panel  . CBC  . Pro b natriuretic peptide (BNP)  . ECHOCARDIOGRAM LIMITED     Disposition:   FU with me in a month. BP diary daily to review on return. See Dr. Tamala Julian in June after limited echo to reassess EF.   Patient is agreeable to this plan and will call if any problems develop in the interim.   SignedTruitt Merle, NP  12/03/2016 11:17 AM  [place] [address] [place], [place]  [address] Phone: [phone] Fax: [phone]

## 2016-12-03 ENCOUNTER — Encounter: Payer: Self-pay | Admitting: Nurse Practitioner

## 2016-12-03 ENCOUNTER — Ambulatory Visit (INDEPENDENT_AMBULATORY_CARE_PROVIDER_SITE_OTHER): Payer: Medicare Other | Admitting: Nurse Practitioner

## 2016-12-03 VITALS — BP 120/52 | HR 86 | Ht 61.0 in

## 2016-12-03 DIAGNOSIS — I255 Ischemic cardiomyopathy: Secondary | ICD-10-CM

## 2016-12-03 DIAGNOSIS — R55 Syncope and collapse: Secondary | ICD-10-CM

## 2016-12-03 DIAGNOSIS — I959 Hypotension, unspecified: Secondary | ICD-10-CM | POA: Diagnosis not present

## 2016-12-03 DIAGNOSIS — E039 Hypothyroidism, unspecified: Secondary | ICD-10-CM | POA: Diagnosis not present

## 2016-12-03 DIAGNOSIS — I2109 ST elevation (STEMI) myocardial infarction involving other coronary artery of anterior wall: Secondary | ICD-10-CM

## 2016-12-03 DIAGNOSIS — I2129 ST elevation (STEMI) myocardial infarction involving other sites: Secondary | ICD-10-CM

## 2016-12-03 NOTE — Patient Instructions (Addendum)
We will be checking the following labs today - BMET, BNP, CBC and TSH   Medication Instructions:    Continue with your current medicines.     Testing/Procedures To Be Arranged:  N/A  Follow-Up:   See me in about a month  See Dr. Tamala Julian in about 2 months with limited echocardiogram (June)    Other Special Instructions:   Daily BP and record - send with patient on return visit    If you need a refill on your cardiac medications before your next appointment, please call your pharmacy.   Call the [place] office at [phone] if you have any questions, problems or concerns.

## 2016-12-04 LAB — CBC
Hematocrit: 36.8 % (ref 34.0–46.6)
Hemoglobin: 12 g/dL (ref 11.1–15.9)
MCH: 30.2 pg (ref 26.6–33.0)
MCHC: 32.6 g/dL (ref 31.5–35.7)
MCV: 93 fL (ref 79–97)
Platelets: 308 10*3/uL (ref 150–379)
RBC: 3.97 x10E6/uL (ref 3.77–5.28)
RDW: 14.6 % (ref 12.3–15.4)
WBC: 6.2 10*3/uL (ref 3.4–10.8)

## 2016-12-04 LAB — PRO B NATRIURETIC PEPTIDE: NT-Pro BNP: 7671 pg/mL — ABNORMAL HIGH (ref 0–287)

## 2016-12-04 LAB — BASIC METABOLIC PANEL
BUN/Creatinine Ratio: 18 (ref 12–28)
BUN: 20 mg/dL (ref 8–27)
CO2: 16 mmol/L — ABNORMAL LOW (ref 18–29)
Calcium: 9 mg/dL (ref 8.7–10.3)
Chloride: 103 mmol/L (ref 96–106)
Creatinine, Ser: 1.09 mg/dL — ABNORMAL HIGH (ref 0.57–1.00)
GFR calc Af Amer: 62 mL/min/{1.73_m2} (ref >59)
GFR calc non Af Amer: 54 mL/min/{1.73_m2} — ABNORMAL LOW (ref >59)
Glucose: 239 mg/dL — ABNORMAL HIGH (ref 65–99)
Potassium: 4.3 mmol/L (ref 3.5–5.2)
Sodium: 139 mmol/L (ref 134–144)

## 2016-12-04 LAB — TSH: TSH: 2.02 u[IU]/mL (ref 0.450–4.500)

## 2016-12-08 ENCOUNTER — Encounter: Payer: Self-pay | Admitting: Internal Medicine

## 2016-12-08 ENCOUNTER — Ambulatory Visit (INDEPENDENT_AMBULATORY_CARE_PROVIDER_SITE_OTHER): Payer: Medicare Other | Admitting: *Deleted

## 2016-12-08 ENCOUNTER — Ambulatory Visit (INDEPENDENT_AMBULATORY_CARE_PROVIDER_SITE_OTHER): Payer: Medicare Other | Admitting: Internal Medicine

## 2016-12-08 VITALS — BP 124/72 | HR 79

## 2016-12-08 DIAGNOSIS — E109 Type 1 diabetes mellitus without complications: Secondary | ICD-10-CM

## 2016-12-08 DIAGNOSIS — I255 Ischemic cardiomyopathy: Secondary | ICD-10-CM

## 2016-12-08 DIAGNOSIS — I639 Cerebral infarction, unspecified: Secondary | ICD-10-CM

## 2016-12-08 DIAGNOSIS — E139 Other specified diabetes mellitus without complications: Secondary | ICD-10-CM

## 2016-12-08 LAB — GLUCOSE, POCT (MANUAL RESULT ENTRY): POC Glucose: 174 mg/dL — AB (ref 70–99)

## 2016-12-08 NOTE — Addendum Note (Signed)
Addended by: Caprice Beaver T on: 12/08/2016 10:31 AM   Modules accepted: Orders

## 2016-12-08 NOTE — Progress Notes (Signed)
Patient ID: Danielle Vargas, female   DOB: [birth year], 66 y.o.   MRN: [id number]  HPI: Danielle Vargas is a 66 y.o.-year-old woman, returning for f/u for of DM - LADA dx 1999, insulin-dependent since 10/2012, uncontrolled, with complications (CAD- s/p STEMI 10/2013, STEMI 11/2016, cerebro-vascular ds. 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 Last visit 1.5 mo ago.  She has [name].  Since last visit, she had another STEMI >> again had angioplasty. She was then admitted with hypotension.  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  Previous regimen: - Metformin 1000 mg bid  - U500 0.11-0.11-0.10 units before meals >> 0.09 mL tid  She has been on Glipizide in the past, too. She tried Invokana in the past >> severe diarrhea  She is on: - Lantus 30 units in am and 20 at bedtime - Novolog:  Before b'fast: 20 >> 30 units  Before lunch: 25 >> 35 units (?)  Before dinner: 20 >> 30 units If sugars before a meal 70-90, then only give Novolog 10 units before that meal. If sugars before a meal <70, then skip Novolog before that meal. However, despite the above instructions, she is not given NovoLog if her sugars at lunch are in the low 100s, and subsequently, sugars before dinner are much higher!  Pt checks her sugars 4x a day: - am: 161-240, 351 >> 99, 128-172, 178, 198 >> 180-303 >> ? >> 156-264 - 2h after b'fast:  114-207 >> 115-205, 239 >> n/c - before lunch: 148-221 >> 216-328 >> 101, 140s >> 104-179, 251 - 2h after lunch: 83, 126-199 >> 57x1, 99-247 >> n/c - before dinner: 101-238 >> 77, 110-246, 272 >>  190-292 >> ? >> 191-306 - 2h after dinner:  111-214 >> 109-273, 339 >> n/c - bedtime: 126-378 >> n/c >> 180-295 >> 130-140s >> 135-227, 283 - nighttime: 161-310 >> n/c >> 75-232 >> 136-291 >> n/c few lows - lowest 77 >> 100; she has hypoglycemia awareness at 95.   Highest sugar was 286 >> 180.  Pt's meals are: - Breakfast: 2 slices of bread + PB - Lunch: sandwich;  salad + cheese + croutons;  baked potato >> pinto beans + cornbread - Dinner: leftovers or corn bread + pinto beans + occas. Cole slaw >> salad + lite dressing + saltines Not soft drinks anymore. Snacks at night. Cut out bread and desserts after the Holidays.  - has mild CKD, last BUN/creatinine:  Lab Results  Component Value Date   BUN 20 12/03/2016   CREATININE 1.09 (H) 12/03/2016  She is back on Losartan. - pt has HL:   Lab Results  Component Value Date   CHOL 117 11/20/2016   HDL 23 (L) 11/20/2016   LDLCALC 49 11/20/2016   TRIG 225 (H) 11/20/2016   CHOLHDL 5.1 11/20/2016  02/2015: TG 252 (PCP). She is on Fenofibrate. - last eye exam was in 02/20/2016 - Dr Zigmund Daniel. R>L DR. No changes.   - + numbness and tingling in her feet. On neurontin.  She also has a history of HTN, HL, fatty liver, GERD, morbid obesity, hypothyroidism, migraines, chronic back pain, history of nephrolithiasis,  osteoporosis, diverticulosis.  She has OSA >> on CPAP.  She also had pancreatitis (03/21/2015).  She has been admitted for CP 11/13/2015 >> r/o for AMI. She saw Dr Sallyanne Kuster in the hospital, her cardiologist is Dr. Tamala Julian. 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 She is still in wheelchair, but can walk slowly with cane. Continues to live in [facility].  I reviewed pt's medications, allergies, PMH, social hx, family hx, and changes were documented in the history of present illness. Otherwise, unchanged from my initial visit note.  ROS: Constitutional: no weight gain or loss, no fatigue, no subjective hyperthermia/hypothermia Eyes: no  blurry vision, no xerophthalmia ENT: no sore throat, no nodules palpated in throat, no dysphagia/odynophagia, no hoarseness Cardiovascular: no CP/SOB/palpitations/leg swelling Respiratory: no cough/SOB/wheezing Gastrointestinal: no N/V/D/C Musculoskeletal: no muscle/joint aches Skin: no rashes Neurological: no tremors/+ numbness/no tingling/dizziness - paresis in L arm  PE: BP 124/72 (BP Location: Left Arm, Patient Position: Sitting)   Pulse 79   SpO2 95%  There is no height or weight on file to calculate BMI. Wt Readings from Last 3 Encounters:  11/28/16 199 lb 1.6 oz (90.3 kg)  11/25/16 197 lb 1.6 oz (89.4 kg)  09/16/16 197 lb (89.4 kg)   Constitutional: overweight, in NAD, in wheelchair Eyes: PERRLA, EOMI, no exophthalmos ENT: moist mucous membranes, no thyromegaly, no cervical lymphadenopathy Cardiovascular: RRR, No MRG Respiratory: CTA B Gastrointestinal: abdomen soft, NT, ND, BS+ Musculoskeletal: + L arm and leg edematous, L hemiparesis Skin: moist, warm, no rashes  ASSESSMENT: 1. DM2, insulin-dependent, uncontrolled, with complications - CAD - STEMI 10/26/2013 - had angioplasty, no stent placed. Had 2DEcho on 01/23/2014 EF increased (from 35) to 55%.             - STEMI 11/19/2016. - cerebro-vascular ds. - R MCA stroke 03/2016 - peripheral neuropathy, on Neurontin - Gastroparesis, on Reglan - Dr. 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  PLAN:  1. Patient with long-standing uncontrolled diabetes. Her control is slightly better in the last 2 years, but she still has sugars in the 200s. She is in [facility] home and unfortunately, she does not receive NovoLog if her sugars are close to the normal range, despite clear instructions about when to hold NovoLog. I will reinforce these instructions, however, I do not feel that she needs a change in her insulin doses for now. - HbA1c obtained last month was better: 6.6% >> will  not check a fructosamine today.  -  I suggested:  Patient Instructions  Please continue: - Lantus 30 units in am and 20 at bedtime - Novolog:  Before b'fast: 30 units  Before lunch: 35 units  Before dinner: 30 units   PLEASE FOLLOW THE FOLLOWING INSTRUCTIONS: If sugars before a meal 70-90, then only give Novolog 10 units before that meal.  If sugars before a meal <70, then skip Novolog before that meal.  Please return in 3 months with your sugar log.    - continue checking sugars 4x a day - UTD with eye exam - UTD with flu shot - Return to clinic in 3 mo with sugar log   Philemon Kingdom, MD PhD [facility] Endocrinology

## 2016-12-08 NOTE — Patient Instructions (Addendum)
Please continue: - Lantus 30 units in am and 20 at bedtime - Novolog:  Before b'fast: 30 units  Before lunch: 35 units  Before dinner: 30 units   PLEASE FOLLOW THE FOLLOWING INSTRUCTIONS: If sugars before a meal 70-90, then only give Novolog 10 units before that meal.  If sugars before a meal <70, then skip Novolog before that meal.  Please return in 3 months with your sugar log.

## 2016-12-08 NOTE — Progress Notes (Signed)
[facility] Summary Report / Loop Recorder 

## 2016-12-11 LAB — CUP PACEART REMOTE DEVICE CHECK
Implantable Pulse Generator Implant Date: 20170705
MDC IDC SESS DTM: 20180401160650

## 2016-12-12 ENCOUNTER — Encounter: Payer: Self-pay | Admitting: Nurse Practitioner

## 2016-12-19 ENCOUNTER — Ambulatory Visit: Payer: Medicare Other | Admitting: Physical Medicine & Rehabilitation

## 2016-12-19 DIAGNOSIS — G8929 Other chronic pain: Secondary | ICD-10-CM | POA: Insufficient documentation

## 2016-12-19 DIAGNOSIS — Z5189 Encounter for other specified aftercare: Secondary | ICD-10-CM | POA: Insufficient documentation

## 2016-12-19 DIAGNOSIS — I10 Essential (primary) hypertension: Secondary | ICD-10-CM | POA: Insufficient documentation

## 2016-12-19 DIAGNOSIS — I69354 Hemiplegia and hemiparesis following cerebral infarction affecting left non-dominant side: Secondary | ICD-10-CM | POA: Insufficient documentation

## 2016-12-19 DIAGNOSIS — E119 Type 2 diabetes mellitus without complications: Secondary | ICD-10-CM | POA: Insufficient documentation

## 2016-12-19 DIAGNOSIS — E785 Hyperlipidemia, unspecified: Secondary | ICD-10-CM | POA: Insufficient documentation

## 2016-12-19 DIAGNOSIS — E669 Obesity, unspecified: Secondary | ICD-10-CM | POA: Insufficient documentation

## 2016-12-23 ENCOUNTER — Encounter: Payer: Self-pay | Admitting: Sports Medicine

## 2016-12-23 ENCOUNTER — Ambulatory Visit (INDEPENDENT_AMBULATORY_CARE_PROVIDER_SITE_OTHER): Payer: Medicare Other | Admitting: Sports Medicine

## 2016-12-23 ENCOUNTER — Encounter: Payer: Self-pay | Admitting: Physical Medicine & Rehabilitation

## 2016-12-23 ENCOUNTER — Encounter: Payer: Medicare Other | Attending: Physical Medicine & Rehabilitation

## 2016-12-23 ENCOUNTER — Ambulatory Visit (HOSPITAL_BASED_OUTPATIENT_CLINIC_OR_DEPARTMENT_OTHER): Payer: Medicare Other | Admitting: Physical Medicine & Rehabilitation

## 2016-12-23 VITALS — BP 113/70 | HR 81 | Resp 14

## 2016-12-23 DIAGNOSIS — E669 Obesity, unspecified: Secondary | ICD-10-CM | POA: Diagnosis not present

## 2016-12-23 DIAGNOSIS — E119 Type 2 diabetes mellitus without complications: Secondary | ICD-10-CM

## 2016-12-23 DIAGNOSIS — I10 Essential (primary) hypertension: Secondary | ICD-10-CM | POA: Diagnosis not present

## 2016-12-23 DIAGNOSIS — Z5189 Encounter for other specified aftercare: Secondary | ICD-10-CM | POA: Diagnosis present

## 2016-12-23 DIAGNOSIS — B351 Tinea unguium: Secondary | ICD-10-CM

## 2016-12-23 DIAGNOSIS — R531 Weakness: Secondary | ICD-10-CM

## 2016-12-23 DIAGNOSIS — G8110 Spastic hemiplegia affecting unspecified side: Secondary | ICD-10-CM | POA: Diagnosis not present

## 2016-12-23 DIAGNOSIS — I255 Ischemic cardiomyopathy: Secondary | ICD-10-CM

## 2016-12-23 DIAGNOSIS — M79676 Pain in unspecified toe(s): Secondary | ICD-10-CM

## 2016-12-23 DIAGNOSIS — G8929 Other chronic pain: Secondary | ICD-10-CM | POA: Diagnosis present

## 2016-12-23 DIAGNOSIS — I69354 Hemiplegia and hemiparesis following cerebral infarction affecting left non-dominant side: Secondary | ICD-10-CM | POA: Diagnosis not present

## 2016-12-23 DIAGNOSIS — L600 Ingrowing nail: Secondary | ICD-10-CM

## 2016-12-23 DIAGNOSIS — E785 Hyperlipidemia, unspecified: Secondary | ICD-10-CM | POA: Diagnosis not present

## 2016-12-23 NOTE — Progress Notes (Signed)
Patient ID: Danielle Vargas, female   DOB: 05-13-1951, 66 y.o.   MRN: [id number]   Subjective: Danielle Vargas is a 66 y.o. female patient with history of type 2 diabetes who presents to office today complaining of long, painful nails and ingrown at right 1st toe  while ambulating in shoes with use of wheelchair; unable to trim. Patient states that the glucose reading this morning was good however drowsy from medication from nursing staff. Patient is at [facility]. Patient denies any new cramping, numbness, burning or tingling in the legs.  Patient Active Problem List   Diagnosis Date Noted  . Pre-syncope 11/28/2016  . Acute combined systolic and diastolic heart failure ([place]) 11/19/2016  . ST elevation myocardial infarction (STEMI) of inferolateral wall ([place]) 11/19/2016  . Spastic hemiplegia affecting nondominant side ([place]) 08/05/2016  . OSA (obstructive sleep apnea) 07/30/2016  . 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 Left spastic hemiparesis ([place]) 07/15/2016  . Bursitis of right hip 04/01/2016  . DM type 2 with diabetic peripheral neuropathy ([place])   . Vascular headache   . Right middle cerebral artery stroke ([place]) 03/14/2016  . Asthma   . Acute lower UTI   . Benign essential HTN   . HLD (hyperlipidemia)   . S/P rotator cuff repair   . Tachypnea   . AKI (acute kidney injury) ([place])   . Acute right MCA stroke ([place])   . Back pain   . Ischemic stroke ([place]) 03/10/2016  . Stroke (cerebrum) ([place]) 03/10/2016  . Chest pain, rule out acute myocardial infarction 11/10/2015  . Chest pain 11/10/2015  . Obesity   . Hypothyroidism   . 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 UTI (urinary tract infection) 06/16/2015  . Type 2 diabetes mellitus with complication ([place]) 03/47/4259  . RUQ abdominal pain 04/17/2015  . Hx  of colonic polyps 04/17/2015  . Family hx of colon cancer 04/17/2015  . CPAP rhinitis 12/08/2014  . OSA on CPAP 12/08/2014  . 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 Retinopathy due to secondary diabetes mellitus, without macular edema, with moderate nonproliferative retinopathy ([place]) 09/18/2014  . Neuropathy due to secondary diabetes mellitus ([place]) 09/18/2014  . DM (diabetes mellitus) ([place]) 09/18/2014  . Obesity hypoventilation syndrome ([place]) 09/18/2014  . CAD in native artery 01/23/2014  . Morbid obesity ([place]) 01/23/2014  . Renal insufficiency 11/17/2013  . HTN (hypertension) 10/29/2013  . Chronic diastolic heart failure ([place]) 10/29/2013  . 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 LADA (latent autoimmune diabetes in adults), managed as type 1 ([place]) 08/08/2013   Current Outpatient Prescriptions on File Prior to Visit  Medication Sig Dispense Refill  . acetaminophen (TYLENOL) 325 MG tablet Take 2 tablets (650 mg total) by mouth every 6 (six) hours as needed for mild pain (or temp >/= 99.5 F).    Marland Kitchen albuterol (PROVENTIL HFA;VENTOLIN HFA) 108 (90 BASE) MCG/ACT inhaler Inhale 2 puffs into the lungs every 6 (six) hours as needed for wheezing or shortness of breath.     Marland Kitchen aspirin EC 81 MG EC tablet Take 1 tablet (81 mg total) by mouth daily.    . carvedilol (COREG) 3.125 MG tablet Take 1 tablet by mouth 2 (two) times daily.    Marland Kitchen docusate sodium (COLACE) 100 MG capsule Take 1 capsule (100 mg total) by mouth 2 (two) times daily. 10 capsule 0  . fenofibrate  160 MG tablet Take 160 mg by mouth at bedtime.     Marland Kitchen FLUoxetine (PROZAC) 10 MG capsule Take 1 capsule (10 mg total) by mouth daily.  3  . furosemide (LASIX) 20 MG tablet Take 1 tablet (20 mg total) by mouth daily as needed (if weight >200 lb). 30 tablet   . gabapentin (NEURONTIN) 300 MG capsule Take 300 mg by mouth 3 (three) times daily.    Marland Kitchen HUMALOG KWIKPEN 100 UNIT/ML KiwkPen Inject 30 Units into the skin 2 (two) times daily.     Marland Kitchen  HYDROcodone-acetaminophen (NORCO) 10-325 MG tablet Take 1 tablet by mouth every 6 (six) hours as needed. 20 tablet 0  . insulin aspart (NOVOLOG) 100 UNIT/ML injection 0-9 Units, Subcutaneous, 3 times daily with meals CBG < 70: implement hypoglycemia protocol-call MD CBG 70 - 120: 0 units CBG 121 - 150: 1 unit CBG 151 - 200: 2 units CBG 201 - 250: 3 units CBG 251 - 300: 5 units CBG 301 - 350: 7 units CBG 351 - 400: 9 units CBG > 400: call MD 10 mL 11  . insulin glargine (LANTUS) 100 UNIT/ML injection Inject 30 units in am and 20 units at bedtime. (Patient taking differently: Inject into the skin. )    . isosorbide mononitrate (IMDUR) 30 MG 24 hr tablet Take 0.5 tablets (15 mg total) by mouth daily.    Marland Kitchen levothyroxine (SYNTHROID, LEVOTHROID) 100 MCG tablet Take 1 tablet (100 mcg total) by mouth daily before breakfast.    . Menthol-Methyl Salicylate (MUSCLE RUB) 10-15 % CREA Apply 1 application topically 2 (two) times daily as needed for muscle pain. For right hip pain  0  . metoCLOPramide (REGLAN) 10 MG tablet Take 10 mg by mouth 4 (four) times daily.     . mometasone (NASONEX) 50 MCG/ACT nasal spray Place 2 sprays into the nose daily. 17 g 12  . mometasone-formoterol (DULERA) 200-5 MCG/ACT AERO Inhale 2 puffs into the lungs 2 (two) times daily.    . montelukast (SINGULAIR) 10 MG tablet Take 10 mg by mouth at bedtime.    . Multiple Vitamin (MULTIVITAMIN WITH MINERALS) TABS tablet Take 1 tablet by mouth daily. One A Day 50+    . nitroGLYCERIN (NITROSTAT) 0.4 MG SL tablet Place 1 tablet (0.4 mg total) under the tongue every 5 (five) minutes as needed for chest pain. 25 tablet 3  . OXYGEN Inhale 1 L into the lungs continuous.    . pantoprazole (PROTONIX) 40 MG tablet Take 1 tablet (40 mg total) by mouth daily.    . polyethylene glycol (MIRALAX / GLYCOLAX) packet Take 17 g by mouth every other day.     . rosuvastatin (CRESTOR) 20 MG tablet Take 1 tablet (20 mg total) by mouth daily. (Patient taking  differently: Take 20 mg by mouth at bedtime. )    . ticagrelor (BRILINTA) 90 MG TABS tablet Take 1 tablet (90 mg total) by mouth 2 (two) times daily. 60 tablet   . tiZANidine (ZANAFLEX) 2 MG tablet Take 2 mg by mouth 3 (three) times daily.     Marland Kitchen topiramate (TOPAMAX) 50 MG tablet Take 50 mg by mouth 2 (two) times daily.     No current facility-administered medications on file prior to visit.    Allergies  Allergen Reactions  . Invokana [Canagliflozin] Diarrhea  . Nsaids Other (See Comments)    GI problems/ stomach pain  . Penicillins Rash    Has patient had a PCN reaction causing immediate rash,  facial/tongue/throat swelling, SOB or lightheadedness with hypotension: No (rash on legs after 3 days [place] -June 2016) Has patient had a PCN reaction causing severe rash involving mucus membranes or skin necrosis: No Has patient had a PCN reaction that required hospitalization No Has patient had a PCN reaction occurring within the last 10 years: Yes If all of the above answers are "NO", then may proceed with Cephalosporin use.    Objective: General: Patient is awake, alert, and oriented x 3 and in no acute distress.  Integument: Skin is warm, dry and supple bilateral. Nails are tender, long, thickened and dystrophic with subungual debris, consistent with onychomycosis, 1-5 bilateral with mild ingrowing at right lateral hallux nail. No acute signs of infection. No open lesions or preulcerative lesions present bilateral. Remaining integument unremarkable.  Vasculature:  Dorsalis Pedis pulse 1/4 bilateral. Posterior Tibial pulse  1/4 bilateral. Capillary fill time <3 sec 1-5 bilateral. Scant hair growth to the level of the digits. Temperature gradient within normal limits. No varicosities present bilateral. Trace edema present bilateral, L>R.   Neurology: The patient has diminished sensation measured with a 5.07/10g Semmes Weinstein Monofilament at all pedal sites bilateral. Vibratory sensation intact  bilateral with tuning fork. No Babinski sign present bilateral.   Musculoskeletal: No gross pedal deformities noted bilateral. Muscular strength 4/5 left on 5/5 on right in all lower extremity muscular groups bilateral without pain or limitation on range of motion. No tenderness with calf compression bilateral.  Assessment and Plan: Problem List Items Addressed This Visit    None    Visit Diagnoses    Dermatophytosis of nail    -  Primary   Ingrown nail       R hallux lateral   Pain of toe, unspecified laterality       Diabetes mellitus without complication (HCC)       Left-sided weakness         -Examined patient. -Discussed and educated patient on diabetic foot care, especially with  regards to the vascular, neurological and musculoskeletal systems.  -Stressed the importance of good glycemic control and the detriment of not  controlling glucose levels in relation to the foot. -Mechanically debrided all nails 1-5 bilateral using sterile nail nipper and filed with dremel without incident -Applied antibiotic cream and bandaid at right hallux and advised patient to have nurse at facility to do the same until heals -Recommend elevation of legs when sitting to assist with edema control  -Answered all patient questions -Patient to return  in 3 months for at risk foot care -Patient advised to call the office if any problems or questions arise in the meantime.  Landis Martins, DPM

## 2016-12-23 NOTE — Progress Notes (Signed)
Subjective:    Patient ID: Danielle Vargas, female    DOB: [birth year], 66 y.o.   MRN: [id number]   Botox injection 11/07/16 Left non dominant Biceps50 FCR25 FCU25 FDS75 FDP25 HPI  Pt feels finger flexion too loose, no grip  Discussed balancing excessive flexion with excess relaxation in finger flexors No post injection bruising  Pain Inventory Average Pain 0 Pain Right Now 0 My pain is no pain  In the last 24 hours, has pain interfered with the following? General activity 0 Relation with others 0 Enjoyment of life 0 What TIME of day is your pain at its worst? no pain Sleep (in general) Good  Pain is worse with: no pain Pain improves with: no pain Relief from Meds: no pain  Mobility walk with assistance use a walker use a wheelchair  Function disabled: date disabled .  Neuro/Psych weakness trouble walking  Prior Studies Any changes since last visit?  no  Physicians involved in your care Any changes since last visit?  no   Family History  Problem Relation Age of Onset  . COPD Father   . Stroke Father   . Emphysema Father   . Heart failure Mother   . Heart attack Mother     several  . Diabetes Mother   . Kidney disease Mother   . Heart failure Brother   . Hypertension Brother   . Colon cancer  45    mat. 1st cousin  . Other      celiac sprue, 1/2 brother  . Diabetes Brother     x 3  . Lung cancer Brother   . Cirrhosis Sister     liver transplant  . Liver disease Sister     transplant  . Heart attack Paternal Grandfather   . Heart attack Brother   . Colon cancer Paternal Aunt 74  . Esophageal cancer Neg Hx   . Stomach cancer Neg Hx   . Rectal cancer Neg Hx    Social History   Social History  . Marital status: Divorced    Spouse name: N/A  . Number of children: 2  . Years of education: N/A   Occupational History  . disabled Unemployed   Social History Main Topics  . Smoking status: Never Smoker  . Smokeless tobacco: Never Used    . Alcohol use No  . Drug use: No  . Sexual activity: Not Currently    Birth control/ protection: Post-menopausal   Other Topics Concern  . None   Social History Narrative   Lives in [place] alone.  Retired.  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   Regular exercise: none   Caffeine use: daily; dt coke            Past Surgical History:  Procedure Laterality Date  . BREAST REDUCTION SURGERY  '88  . CARPAL TUNNEL RELEASE  2000   right wrist  . CERVICAL DISC SURGERY  '98   fusion  . CESAREAN SECTION  '79  '84  . COLONOSCOPY    . CORONARY BALLOON ANGIOPLASTY N/A 11/19/2016   Procedure: Coronary Balloon Angioplasty;  Surgeon: Sherren Mocha, MD;  Location: [facility];  Service: Cardiovascular;  Laterality: N/A;  . EP IMPLANTABLE DEVICE N/A 03/12/2016   Procedure: Loop Recorder Insertion;  Surgeon: Will Meredith Leeds, MD;  Location: [place] [facility];  Service: Cardiovascular;  Laterality: N/A;  . LEFT HEART CATH AND CORONARY ANGIOGRAPHY N/A 11/19/2016   Procedure: Left Heart Cath  and Coronary Angiography;  Surgeon: Sherren Mocha, MD;  Location: [facility];  Service: Cardiovascular;  Laterality: N/A;  . LEFT HEART CATHETERIZATION WITH CORONARY ANGIOGRAM N/A 10/26/2013   Procedure: LEFT HEART CATHETERIZATION WITH CORONARY ANGIOGRAM;  Surgeon: Sinclair Grooms, MD;  Location[facility];  Service: Cardiovascular;  Laterality: N/A;  . ROTATOR CUFF REPAIR  '09   left shoulder  . TEE WITHOUT CARDIOVERSION N/A 03/12/2016   Procedure: TRANSESOPHAGEAL ECHOCARDIOGRAM (TEE);  Surgeon: Sanda Klein, MD;  Location: [facility] ENDOSCOPY;  Service: Cardiovascular;  Laterality: N/A;  . TUBAL LIGATION  '85  . ULNAR NERVE REPAIR  '02, '08   left done, then right   Past Medical History:  Diagnosis Date  . Acute combined systolic and diastolic heart failure ([place]) 11/19/2016  . Allergy   . Asthma   . CAD (coronary artery disease)    a. s/p lateral STEMI in 2015 2ry to  occlusion of OM1 thought to be caused by coronary artery dissection, treated with POBA. b. 11/2016: STEMI with thrombotic occlusion of distal LCx which was treated with balloon angioplasty.   . Cataract    "just beginning"  . Colon polyps 7/07   hyperplastic and adenomatous  . Depression    pt unsure, psychologist said no  . Diverticulosis   . DM (diabetes mellitus) ([place])   . Dyslipidemia   . External hemorrhoid   . Fatty liver   . Gastroparesis   . HTN (hypertension)   . Hyperlipidemia   . 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 Hypothyroidism   . Migraine   . Myocardial infarction ([place])    FEb 2015  . Nephrolithiasis   . Obesity   . Osteoporosis   . Sleep apnea    wears CPAP  . ST elevation myocardial infarction (STEMI) of inferolateral wall ([place]) 11/19/2016  . Stroke ([place])    BP 113/70   Pulse 81   Resp 14   SpO2 97%   Opioid Risk Score:   Fall Risk Score:  `1  Depression screen PHQ 2/9  Depression screen [facility] 2/9 10/09/2016 07/15/2016 09/21/2015 09/18/2014 12/26/2013  Decreased Interest 0 0 0 1 0  Down, Depressed, Hopeless 0 0 0 1 1  PHQ - 2 Score 0 0 0 2 1  Some recent data might be hidden    Review of Systems  HENT: Negative.   Eyes: Negative.   Respiratory: Positive for shortness of breath.   Cardiovascular: Negative.   Gastrointestinal: Negative.   Endocrine: Negative.   Genitourinary: Negative.   Musculoskeletal: Positive for gait problem.       Spasticity  Skin: Negative.   Allergic/Immunologic: Negative.   Neurological: Positive for weakness.  Hematological: Negative.   Psychiatric/Behavioral: Negative.   All other systems reviewed and are negative.      Objective:   Physical Exam  Constitutional: She is oriented to person, place, and time. She appears well-developed and well-nourished.  HENT:  Head: Normocephalic and atraumatic.  Eyes: Pupils are equal, round, and reactive to light.  Neurological: She is alert and oriented to person, place,  and time. Gait abnormal.  Wheelchair-bound, nonambulatory  Nursing note and vitals reviewed.   Trace finger flexion, trace biceps. [place] 2 at the elbow flexors, 0 at the finger flexors, 0 at the wrist flexors on the left side.      Assessment & Plan:  1. Left nondominant spastic hemiplegia. Improvement in finger flexor, wrist flexor, elbow flexor spasticity post Botox injection, patient feels that  finger flexor tone reduction is excessive. Will adjust next Botox injection. Reduce FDS from 75 units to 50 units.  Discussed balancing excessive flexion with excessive relaxation with patient. Discussed that Botox does not increase motor strength, but weakens injected muscles

## 2016-12-23 NOTE — Patient Instructions (Signed)
Will adjust botox dose reduce by 25 U next injection

## 2016-12-30 ENCOUNTER — Encounter (HOSPITAL_COMMUNITY): Payer: Self-pay | Admitting: *Deleted

## 2016-12-30 ENCOUNTER — Encounter: Payer: Self-pay | Admitting: Nurse Practitioner

## 2016-12-30 ENCOUNTER — Ambulatory Visit (INDEPENDENT_AMBULATORY_CARE_PROVIDER_SITE_OTHER): Payer: Medicare Other | Admitting: Nurse Practitioner

## 2016-12-30 VITALS — BP 120/70 | HR 87 | Ht 61.0 in

## 2016-12-30 DIAGNOSIS — I2589 Other forms of chronic ischemic heart disease: Secondary | ICD-10-CM | POA: Diagnosis not present

## 2016-12-30 DIAGNOSIS — I255 Ischemic cardiomyopathy: Secondary | ICD-10-CM

## 2016-12-30 NOTE — Patient Instructions (Addendum)
We will be checking the following labs today - NONE   Medication Instructions:    Continue with your current medicines.     Testing/Procedures To Be Arranged:  N/A  Follow-Up:   See Dr. Tamala Julian as planned.     Other Special Instructions:   N/A    If you need a refill on your cardiac medications before your next appointment, please call your pharmacy.   Call the [place] office at [phone] if you have any questions, problems or concerns.

## 2016-12-30 NOTE — Progress Notes (Signed)
CARDIOLOGY OFFICE NOTE  Date:  12/30/2016    Danielle Vargas: 1951-04-03 Medical Record #[id number]  PCP:  Marton Redwood, MD  Cardiologist:  Jennings Books   Chief Complaint  Patient presents with  . Coronary Artery Disease  . Cardiomyopathy    1 month check - seen for Dr. Tamala Julian    History of Present Illness: Danielle Vargas is a 66 y.o. female who presents today for a one month check. Seen for Dr. Tamala Julian.   Has not seen Dr. Tamala Julian since February of 2016. Last visit here with Richardson Dopp, PA back in September of 2016.   She has a hx of CAD, ischemic cardiomyopathy, OSA, prior CVA, IDDM, HTN, & HLD. 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 This was treated with angioplasty. EF was 35-40% at the time. Follow-up echo 5/15 demonstrated improved LV function with an EF of 55%. Last seen by Dr. Tamala Julian 10/2014. Plavix was discontinued. Beta blocker dose was reduced previously secondary to low blood pressure. She has also had her Norvasc stopped due to symptomatic low BP.    Has had 2 admissions last month. Discharged on 3/20 after having STEMI.  Cardiac catheterization by Dr. Burt Knack showed a thrombotic occlusion of the distal LCx which was treated with balloon angioplasty. Minor nonobstructive disease was noted along the RCA, LM, and LAD. EF was reduced at 25-30%. She was started on ASA and Brilinta and is to remain on this for at least 12 months. She initially required BiPAP following the procedure but was changed over to nasal cannula the following day. Continued to diurese well with IV Lasix. Troponin values peaked at 51.68. Echo was performed on 3/15 and showed an EF of 20-25% with severely reduced with apical ballooning and akinesis of the mid and apical anterior, inferolateral, inferior, anterior myocardiumas well as akinesis of the apical, mid  inferoseptal and anteroseptal walls.  She continued to diurese over the next several days with IV Lasix. As she was diuresed, creatinine trended upwards. Creatinine peaked at 2.06 on 3/19, butimproved to 1.51 the following day. Lisinopril 2.5mg  daily and Spironolactone 12.5mg  daily were added on 3/17 due to her reduced EF but were later discontinued secondary to her AKI and hypotension. She was to continue on Coreg 3.125mg  BID and Imdur 30mg  daily along with her ASA and Brilinta. Was to restart low-dose PTA Lasix 10mg  daily.   Then sent to SNF. But readmitted just 3 days later - had had transient hypotension. Unclear if this was due to medications or autonomic dysfunction given long standing DM. Her medicines were cut back further.   I then saw her back last month - had only been back to the SNF 4 days - apparently will be there long term. She was doing ok. On very little CHF medicines.   Comes in today. Here alone today - got dropped of. She remains in a wheelchair. Affect flat. Looks like she is now on daily Lasix at 40 mg a day. Says she is doing ok. Not short of breath unless she really exerts or gets anxious. No chest pain. She is getting therapy it sounds like. Says she has already had it twice today. Walking very short distances. Was not able to stand here. Dr. Philip Aspen is seeing her there. Her one concern is that she wants off her oxygen.   Past  Medical History:  Diagnosis Date  . Acute combined systolic and diastolic heart failure ([place]) 11/19/2016  . Allergy   . Asthma   . CAD (coronary artery disease)    a. s/p lateral STEMI in 2015 2ry to occlusion of OM1 thought to be caused by coronary artery dissection, treated with POBA. b. 11/2016: STEMI with thrombotic occlusion of distal LCx which was treated with balloon angioplasty.   . Cataract    "just beginning"  . Colon polyps 7/07   hyperplastic and adenomatous  . Depression    pt unsure, psychologist said no  . Diverticulosis   . DM  (diabetes mellitus) ([place])   . Dyslipidemia   . External hemorrhoid   . Fatty liver   . Gastroparesis   . HTN (hypertension)   . Hyperlipidemia   . 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 Hypothyroidism   . Migraine   . Myocardial infarction ([place])    FEb 2015  . Nephrolithiasis   . Obesity   . Osteoporosis   . Sleep apnea    wears CPAP  . ST elevation myocardial infarction (STEMI) of inferolateral wall ([place]) 11/19/2016  . Stroke [facility])     Past Surgical History:  Procedure Laterality Date  . BREAST REDUCTION SURGERY  '88  . CARPAL TUNNEL RELEASE  2000   right wrist  . CERVICAL DISC SURGERY  '98   fusion  . CESAREAN SECTION  '79  '84  . COLONOSCOPY    . CORONARY BALLOON ANGIOPLASTY N/A 11/19/2016   Procedure: Coronary Balloon Angioplasty;  Surgeon: Sherren Mocha, MD;  Location: [facility];  Service: Cardiovascular;  Laterality: N/A;  . EP IMPLANTABLE DEVICE N/A 03/12/2016   Procedure: Loop Recorder Insertion;  Surgeon: Will Meredith Leeds, MD;  Location: [place] [facility];  Service: Cardiovascular;  Laterality: N/A;  . LEFT HEART CATH AND CORONARY ANGIOGRAPHY N/A 11/19/2016   Procedure: Left Heart Cath and Coronary Angiography;  Surgeon: Sherren Mocha, MD;  Location: [place] [facility];  Service: Cardiovascular;  Laterality: N/A;  . LEFT HEART CATHETERIZATION WITH CORONARY ANGIOGRAM N/A 10/26/2013   Procedure: LEFT HEART CATHETERIZATION WITH CORONARY ANGIOGRAM;  Surgeon: Sinclair Grooms, MD;  Location: [facility] CATH LAB;  Service: Cardiovascular;  Laterality: N/A;  . ROTATOR CUFF REPAIR  '09   left shoulder  . TEE WITHOUT CARDIOVERSION N/A 03/12/2016   Procedure: TRANSESOPHAGEAL ECHOCARDIOGRAM (TEE);  Surgeon: Sanda Klein, MD;  Location: [facility] ENDOSCOPY;  Service: Cardiovascular;  Laterality: N/A;  . TUBAL LIGATION  '85  . ULNAR NERVE REPAIR  '02, '08   left done, then right     Medications: Current Outpatient Prescriptions  Medication Sig Dispense Refill  . albuterol  (PROVENTIL HFA;VENTOLIN HFA) 108 (90 BASE) MCG/ACT inhaler Inhale 2 puffs into the lungs every 6 (six) hours as needed for wheezing or shortness of breath.     Marland Kitchen aspirin EC 81 MG EC tablet Take 1 tablet (81 mg total) by mouth daily.    . carvedilol (COREG) 3.125 MG tablet Take 1 tablet by mouth 2 (two) times daily.    Marland Kitchen docusate sodium (COLACE) 100 MG capsule Take 1 capsule (100 mg total) by mouth 2 (two) times daily. 10 capsule 0  . fenofibrate 160 MG tablet Take 160 mg by mouth at bedtime.     Marland Kitchen FLUoxetine (PROZAC) 10 MG capsule Take 1 capsule (10 mg total) by mouth daily.  3  . furosemide (LASIX) 20 MG tablet Take 1 tablet (20 mg total) by  mouth daily as needed (if weight >200 lb). 30 tablet   . gabapentin (NEURONTIN) 300 MG capsule Take 300 mg by mouth 3 (three) times daily.    Marland Kitchen HUMALOG KWIKPEN 100 UNIT/ML KiwkPen Inject 30 Units into the skin 2 (two) times daily.     Marland Kitchen HYDROcodone-acetaminophen (NORCO) 10-325 MG tablet Take 1 tablet by mouth every 6 (six) hours as needed. 20 tablet 0  . insulin aspart (NOVOLOG) 100 UNIT/ML injection 0-9 Units, Subcutaneous, 3 times daily with meals CBG < 70: implement hypoglycemia protocol-call MD CBG 70 - 120: 0 units CBG 121 - 150: 1 unit CBG 151 - 200: 2 units CBG 201 - 250: 3 units CBG 251 - 300: 5 units CBG 301 - 350: 7 units CBG 351 - 400: 9 units CBG > 400: call MD 10 mL 11  . insulin glargine (LANTUS) 100 UNIT/ML injection Inject 30 units in am and 20 units at bedtime. (Patient taking differently: Inject into the skin. )    . isosorbide mononitrate (IMDUR) 30 MG 24 hr tablet Take 30 mg by mouth daily.    Marland Kitchen levothyroxine (SYNTHROID, LEVOTHROID) 100 MCG tablet Take 1 tablet (100 mcg total) by mouth daily before breakfast.    . Menthol-Methyl Salicylate (MUSCLE RUB) 10-15 % CREA Apply 1 application topically 2 (two) times daily as needed for muscle pain. For right hip pain  0  . metoCLOPramide (REGLAN) 10 MG tablet Take 10 mg by mouth 4 (four)  times daily.     . mometasone (NASONEX) 50 MCG/ACT nasal spray Place 2 sprays into the nose daily. 17 g 12  . mometasone-formoterol (DULERA) 200-5 MCG/ACT AERO Inhale 2 puffs into the lungs 2 (two) times daily.    . montelukast (SINGULAIR) 10 MG tablet Take 10 mg by mouth at bedtime.    . Multiple Vitamin (MULTIVITAMIN WITH MINERALS) TABS tablet Take 1 tablet by mouth daily. One A Day 50+    . nitroGLYCERIN (NITROSTAT) 0.4 MG SL tablet Place 1 tablet (0.4 mg total) under the tongue every 5 (five) minutes as needed for chest pain. 25 tablet 3  . OXYGEN Inhale 1 L into the lungs continuous.    . pantoprazole (PROTONIX) 40 MG tablet Take 1 tablet (40 mg total) by mouth daily.    . polyethylene glycol (MIRALAX / GLYCOLAX) packet Take 17 g by mouth every other day.     . rosuvastatin (CRESTOR) 20 MG tablet Take 1 tablet (20 mg total) by mouth daily. (Patient taking differently: Take 20 mg by mouth at bedtime. )    . ticagrelor (BRILINTA) 90 MG TABS tablet Take 1 tablet (90 mg total) by mouth 2 (two) times daily. 60 tablet   . tiZANidine (ZANAFLEX) 2 MG tablet Take 2 mg by mouth 3 (three) times daily.     Marland Kitchen topiramate (TOPAMAX) 50 MG tablet Take 50 mg by mouth 2 (two) times daily.     No current facility-administered medications for this visit.     Allergies: Allergies  Allergen Reactions  . Invokana [Canagliflozin] Diarrhea  . Nsaids Other (See Comments)    GI problems/ stomach pain  . Penicillins Rash    Has patient had a PCN reaction causing immediate rash, facial/tongue/throat swelling, SOB or lightheadedness with hypotension: No (rash on legs after 3 days [place] -June 2016) Has patient had a PCN reaction causing severe rash involving mucus membranes or skin necrosis: No Has patient had a PCN reaction that required hospitalization No Has patient had a PCN reaction  occurring within the last 10 years: Yes If all of the above answers are "NO", then may proceed with Cephalosporin use.    Social  History: The patient  reports that she has never smoked. She has never used smokeless tobacco. She reports that she does not drink alcohol or use drugs.   Family History: The patient's family history includes COPD in her father; Cirrhosis in her sister; Colon cancer (age of onset: 23) in her paternal aunt; Diabetes in her brother and mother; Emphysema in her father; Heart attack in her brother, mother, and paternal grandfather; Heart failure in her brother and mother; Hypertension in her brother; Kidney disease in her mother; Liver disease in her sister; Lung cancer in her brother; Stroke in her father.   Review of Systems: Please see the history of present illness.   Otherwise, the review of systems is positive for none.   All other systems are reviewed and negative.   Physical Exam: VS:  BP 120/70 (BP Location: Right Arm, Patient Position: Sitting, Cuff Size: Normal)   Pulse 87   Ht 5\' 1"  (1.549 m)   SpO2 99% Comment: 2 liters of 02 .  BMI There is no height or weight on file to calculate BMI.  Wt Readings from Last 3 Encounters:  11/28/16 199 lb 1.6 oz (90.3 kg)  11/25/16 197 lb 1.6 oz (89.4 kg)  09/16/16 197 lb (89.4 kg)    General: Pleasant. Chronically ill. She is alert and in no acute distress.  Not able to stand to weigh. In a wheelchair.  HEENT: Normal.  Neck: Supple, no JVD, carotid bruits, or masses noted.  Cardiac: Regular rate and rhythm. No murmurs, rubs, or gallops. No edema.  Respiratory:  Lungs are clear to auscultation bilaterally with normal work of breathing. She has oxygen in place.  GI: Soft and nontender. Obese.  MS: No deformity or atrophy. Gait not tested.  Skin: Warm and dry. Color is normal.  Neuro:  Strength and sensation are intact and no gross focal deficits noted.  Psych: Alert, appropriate and with normal affect.   LABORATORY DATA:  EKG:  EKG is not ordered today.  Lab Results  Component Value Date   WBC 6.2 12/03/2016   HGB 11.5 (L) 11/29/2016     HCT 36.8 12/03/2016   PLT 308 12/03/2016   GLUCOSE 239 (H) 12/03/2016   CHOL 117 11/20/2016   TRIG 225 (H) 11/20/2016   HDL 23 (L) 11/20/2016   LDLCALC 49 11/20/2016   ALT 22 11/28/2016   AST 37 11/28/2016   NA 139 12/03/2016   K 4.3 12/03/2016   CL 103 12/03/2016   CREATININE 1.09 (H) 12/03/2016   BUN 20 12/03/2016   CO2 16 (L) 12/03/2016   TSH 2.020 12/03/2016   INR 1.13 11/19/2016   HGBA1C 6.6 (H) 11/19/2016    BNP (last 3 results)  Recent Labs  11/19/16 1028 11/21/16 0817 11/28/16 1452  BNP 275.0* 726.9* 797.4*    ProBNP (last 3 results)  Recent Labs  12/03/16 1139  PROBNP 7,671*     Other Studies Reviewed Today:  Echo Study Conclusions 11/2016  - Left ventricle: The cavity size was normal. Systolic function was severely reduced with apical ballooning and akinesis of the mid and apical anterior, inferolateral, inferior, anterior myocardium as well as akinesis of the apical, mid inferoseptal and anteroseptal walls. The estimated ejection fraction was in the range of 20% to 25%. This is consistent with stress cardiomyopathy. There is akinesis of the  mid-apicalanterior, inferolateral, and inferior myocardium. There is akinesis of the entire lateral, mid inferoseptal, apical septal, mid anteroseptal and entire apical myocardium. - Right ventricle: Poorly visualized. Pacer wire or catheter noted in right ventricle. - Pulmonary arteries: Systolic pressure could not be accurately estimated.  Procedures   Coronary Balloon Angioplasty 11/2016  Left Heart Cath and Coronary Angiography  Conclusion   1. Acute inferolateral STEMI secondary to thrombotic occlusion of the distal left circumflex, treated successfully with balloon angioplasty 2. Acute systolic heart failure with markedly elevated LVEDP, improved with IV nitroglycerin, IV Lasix, and BiPAP 3. Minor nonobstructive disease in the RCA, left main, and LAD 4. Severe LV  systolic dysfunction with hyperkinetic basal segments and dyskinesis of the anterolateral, periapical, and infero-apical walls, LVEF estimated at 25-30%  Recommendations:  Aspirin and brilinta (dual antiplatelet therapy) 12 months  Continue BiPAP and IV nitroglycerin. Wean BiPAP as tolerated  Start low-dose beta blocker as tolerated  ICU admission     Assessment/Plan:  Presyncope: has had issues with chronic hypotension which limits ability to have on adequate CHF regimen. BP ok today - no further issues.   Recent STEMI status post PCI: No active chest pain. Remains on DAPT.    Insulin-dependent type 2 diabetes: Followed by house MD  History of CVA: Continue aspirin, statin-has mild left-sided deficits-that is unchanged per patient   Ischemic CM with chronic systolic heart failure (EF 20-25% by TTE on 3/15):  She is on a very limited regimen due to chronic hypotension. Fortunately, no symptoms. Prognosis guarded at best. She will be having limited echo in June with follow up with Dr. Tamala Julian. Ok to try and wean her oxygen - would want to keep her sats greater than 90%.    Hypothyroidism: Continue Synthroid  Hx of physical deconditioning/gen weakness:per patient she is mostly wheelchair bound at baseline-continue rehab services while at [facility]. Does sound like she is getting therapy and making slow improvements.   HLD - on statin therapy.   Loop recorder in place -  checked with EP at her last visit here - monitor is now working per [employer] .  Current medicines are reviewed with the patient today.  The patient does not have concerns regarding medicines other than what has been noted above.  The following changes have been made:  See above.  Labs/ tests ordered today include:   No orders of the defined types were placed in this encounter.    Disposition:   FU with Dr. Tamala Julian as planned in June.   Patient is agreeable to this plan and will call if any problems  develop in the interim.   SignedTruitt Merle, NP  12/30/2016 11:23 AM  [place] [address] [place], [place]  [address] Phone: [phone] Fax: [phone]

## 2017-01-06 ENCOUNTER — Ambulatory Visit (INDEPENDENT_AMBULATORY_CARE_PROVIDER_SITE_OTHER): Payer: Medicare Other | Admitting: *Deleted

## 2017-01-06 DIAGNOSIS — I639 Cerebral infarction, unspecified: Secondary | ICD-10-CM | POA: Diagnosis not present

## 2017-01-07 NOTE — Progress Notes (Signed)
[facility] Summary Report / Loop Recorder 

## 2017-01-18 LAB — CUP PACEART REMOTE DEVICE CHECK
Date Time Interrogation Session: 20180501163638
MDC IDC PG IMPLANT DT: 20170705

## 2017-01-18 NOTE — Progress Notes (Signed)
Carelink summary report received. Battery status OK. Normal device function. No new symptom episodes, tachy episodes, brady, or pause episodes. No new AF episodes. Monthly summary reports and ROV/PRN 

## 2017-02-05 ENCOUNTER — Ambulatory Visit (INDEPENDENT_AMBULATORY_CARE_PROVIDER_SITE_OTHER): Payer: Medicare Other | Admitting: *Deleted

## 2017-02-05 DIAGNOSIS — I639 Cerebral infarction, unspecified: Secondary | ICD-10-CM | POA: Diagnosis not present

## 2017-02-05 NOTE — Progress Notes (Signed)
[facility] Summary Report 

## 2017-02-07 LAB — CUP PACEART REMOTE DEVICE CHECK
Date Time Interrogation Session: 20180531163850
MDC IDC PG IMPLANT DT: 20170705

## 2017-02-07 NOTE — Progress Notes (Signed)
Carelink summary report received. Battery status OK. Normal device function. No new symptom episodes, tachy episodes, brady, or pause episodes. No new AF episodes. Monthly summary reports and ROV/PRN 

## 2017-02-10 ENCOUNTER — Encounter: Payer: Medicare Other | Attending: Physical Medicine & Rehabilitation

## 2017-02-10 ENCOUNTER — Encounter: Payer: Self-pay | Admitting: Physical Medicine & Rehabilitation

## 2017-02-10 ENCOUNTER — Ambulatory Visit (HOSPITAL_BASED_OUTPATIENT_CLINIC_OR_DEPARTMENT_OTHER): Payer: Medicare Other | Admitting: Physical Medicine & Rehabilitation

## 2017-02-10 VITALS — BP 111/72 | HR 73

## 2017-02-10 DIAGNOSIS — E119 Type 2 diabetes mellitus without complications: Secondary | ICD-10-CM | POA: Diagnosis not present

## 2017-02-10 DIAGNOSIS — G8114 Spastic hemiplegia affecting left nondominant side: Secondary | ICD-10-CM

## 2017-02-10 DIAGNOSIS — G8929 Other chronic pain: Secondary | ICD-10-CM | POA: Insufficient documentation

## 2017-02-10 DIAGNOSIS — G8110 Spastic hemiplegia affecting unspecified side: Secondary | ICD-10-CM

## 2017-02-10 DIAGNOSIS — Z5189 Encounter for other specified aftercare: Secondary | ICD-10-CM | POA: Insufficient documentation

## 2017-02-10 DIAGNOSIS — E785 Hyperlipidemia, unspecified: Secondary | ICD-10-CM | POA: Insufficient documentation

## 2017-02-10 DIAGNOSIS — I69354 Hemiplegia and hemiparesis following cerebral infarction affecting left non-dominant side: Secondary | ICD-10-CM | POA: Insufficient documentation

## 2017-02-10 DIAGNOSIS — I10 Essential (primary) hypertension: Secondary | ICD-10-CM | POA: Diagnosis not present

## 2017-02-10 DIAGNOSIS — E669 Obesity, unspecified: Secondary | ICD-10-CM | POA: Insufficient documentation

## 2017-02-10 NOTE — Progress Notes (Signed)
Botox Injection for spasticity using needle EMG guidance  Dilution: 50 Units/ml Indication: Severe spasticity which interferes with ADL,mobility and/or  hygiene and is unresponsive to medication management and other conservative care Informed consent was obtained after describing risks and benefits of the procedure with the patient. This includes bleeding, bruising, infection, excessive weakness, or medication side effects. A REMS form is on file and signed. Needle: 27g 1" needle electrode Number of units per muscle LEFT NON Dominant Biceps50 FCR25 FCU25 FDS75 FDP25 All injections were done after obtaining appropriate EMG activity and after negative drawback for blood. The patient tolerated the procedure well. Post procedure instructions were given. A followup appointment was made.

## 2017-02-10 NOTE — Patient Instructions (Signed)

## 2017-02-25 ENCOUNTER — Ambulatory Visit (INDEPENDENT_AMBULATORY_CARE_PROVIDER_SITE_OTHER): Payer: Medicare Other | Admitting: Ophthalmology

## 2017-02-25 DIAGNOSIS — I10 Essential (primary) hypertension: Secondary | ICD-10-CM | POA: Diagnosis not present

## 2017-02-25 DIAGNOSIS — H35033 Hypertensive retinopathy, bilateral: Secondary | ICD-10-CM | POA: Diagnosis not present

## 2017-02-25 DIAGNOSIS — H43813 Vitreous degeneration, bilateral: Secondary | ICD-10-CM

## 2017-02-25 DIAGNOSIS — E11319 Type 2 diabetes mellitus with unspecified diabetic retinopathy without macular edema: Secondary | ICD-10-CM

## 2017-02-25 DIAGNOSIS — E113293 Type 2 diabetes mellitus with mild nonproliferative diabetic retinopathy without macular edema, bilateral: Secondary | ICD-10-CM | POA: Diagnosis not present

## 2017-02-26 ENCOUNTER — Telehealth: Payer: Self-pay | Admitting: Cardiology

## 2017-02-26 NOTE — Telephone Encounter (Signed)
LMOVM requesting that pt send manual transmission b/c home monitor has not updated in at least 14 days.    

## 2017-03-03 ENCOUNTER — Encounter: Payer: Self-pay | Admitting: Neurology

## 2017-03-04 ENCOUNTER — Ambulatory Visit (INDEPENDENT_AMBULATORY_CARE_PROVIDER_SITE_OTHER): Payer: Medicare Other | Admitting: Neurology

## 2017-03-04 ENCOUNTER — Encounter: Payer: Self-pay | Admitting: Neurology

## 2017-03-04 VITALS — BP 138/65 | HR 78 | Ht 61.0 in | Wt 201.0 lb

## 2017-03-04 DIAGNOSIS — Z9989 Dependence on other enabling machines and devices: Secondary | ICD-10-CM

## 2017-03-04 DIAGNOSIS — I255 Ischemic cardiomyopathy: Secondary | ICD-10-CM | POA: Diagnosis not present

## 2017-03-04 DIAGNOSIS — G4733 Obstructive sleep apnea (adult) (pediatric): Secondary | ICD-10-CM

## 2017-03-04 DIAGNOSIS — I69354 Hemiplegia and hemiparesis following cerebral infarction affecting left non-dominant side: Secondary | ICD-10-CM | POA: Diagnosis not present

## 2017-03-04 NOTE — Progress Notes (Signed)
Cardiology Office Note    Date:  03/05/2017   ID:  Danielle Vargas, DOB Apr 25, 1951, MRN [id number]  PCP:  Marton Redwood, MD  Cardiologist: Sinclair Grooms, MD   Chief Complaint  Patient presents with  . Follow-up    CAD  . Shortness of Breath  . Edema    Left foot and leg    History of Present Illness:  Danielle Vargas is a 66 y.o. female who has a hx of CAD, ischemic cardiomyopathy, OSA, prior CVA, IDDM, HTN, & HLD. 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 This was treated with angioplasty. EF was 35-40% at the time. Follow-up echo 5/15 demonstrated improved LV function with an EF of 55%. Represented this March with acute occlusion of the circumflex treated with angioplasty. There was acute decrease in LV systolic function to less than 25%.    She has recovered from her most recent acute coronary syndrome in March 2018. A very odd presentation which was similar to 2015. She has a large branching circumflex. On the initial episode she had occlusion and possible SCAD of the first branch of the circumflex. This was treated with angioplasty. On this occasion she had acute occlusion of the second branch that was again treated with angioplasty with success. LV dysfunction on both occasions appeared to be out of proportion to the size vessel involved. LV dysfunction pattern appeared to be more that of apical ballooning. After the most recent hospital ACS event, she had a recurring episodes of heart failure. She is now on minimal heart failure therapy, denies dyspnea, but is a permanent resident in a nursing facility. She has not needed nitroglycerin. She denies orthopnea or lower extremity swelling.  Past Medical History:  Diagnosis Date  . Acute combined systolic and diastolic heart failure ([place]) 11/19/2016  . Allergy   . Asthma   . CAD (coronary artery disease)    a. s/p  lateral STEMI in 2015 2ry to occlusion of OM1 thought to be caused by coronary artery dissection, treated with POBA. b. 11/2016: STEMI with thrombotic occlusion of distal LCx which was treated with balloon angioplasty.   . Cataract    "just beginning"  . Colon polyps 7/07   hyperplastic and adenomatous  . Depression    pt unsure, psychologist said no  . Diverticulosis   . DM (diabetes mellitus) ([place])   . Dyslipidemia   . External hemorrhoid   . Fatty liver   . Gastroparesis   . HTN (hypertension)   . Hyperlipidemia   . 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 Hypothyroidism   . Migraine   . Myocardial infarction ([place])    FEb 2015  . Nephrolithiasis   . Obesity   . Osteoporosis   . Sleep apnea    wears CPAP  . ST elevation myocardial infarction (STEMI) of inferolateral wall ([place]) 11/19/2016  . Stroke [facility])     Past Surgical History:  Procedure Laterality Date  . BREAST REDUCTION SURGERY  '88  . CARPAL TUNNEL RELEASE  2000   right wrist  . CERVICAL DISC SURGERY  '98   fusion  . CESAREAN SECTION  '79  '84  . COLONOSCOPY    . CORONARY BALLOON ANGIOPLASTY N/A 11/19/2016   Procedure: Coronary Balloon Angioplasty;  Surgeon: Sherren Mocha, MD;  Location: [facility];  Service: Cardiovascular;  Laterality: N/A;  .  EP IMPLANTABLE DEVICE N/A 03/12/2016   Procedure: Loop Recorder Insertion;  Surgeon: Will Meredith Leeds, MD;  Location: [facility];  Service: Cardiovascular;  Laterality: N/A;  . LEFT HEART CATH AND CORONARY ANGIOGRAPHY N/A 11/19/2016   Procedure: Left Heart Cath and Coronary Angiography;  Surgeon: Sherren Mocha, MD;  Location: [facility];  Service: Cardiovascular;  Laterality: N/A;  . LEFT HEART CATHETERIZATION WITH CORONARY ANGIOGRAM N/A 10/26/2013   Procedure: LEFT HEART CATHETERIZATION WITH CORONARY ANGIOGRAM;  Surgeon: Sinclair Grooms, MD;  Location: [facility] CATH LAB;  Service: Cardiovascular;  Laterality: N/A;  . ROTATOR CUFF REPAIR  '09   left  shoulder  . TEE WITHOUT CARDIOVERSION N/A 03/12/2016   Procedure: TRANSESOPHAGEAL ECHOCARDIOGRAM (TEE);  Surgeon: Sanda Klein, MD;  Location: [facility] ENDOSCOPY;  Service: Cardiovascular;  Laterality: N/A;  . TUBAL LIGATION  '85  . ULNAR NERVE REPAIR  '02, '08   left done, then right    Current Medications: Outpatient Medications Prior to Visit  Medication Sig Dispense Refill  . albuterol (PROVENTIL HFA;VENTOLIN HFA) 108 (90 BASE) MCG/ACT inhaler Inhale 2 puffs into the lungs every 6 (six) hours as needed for wheezing or shortness of breath.     Marland Kitchen aspirin EC 81 MG EC tablet Take 1 tablet (81 mg total) by mouth daily.    . carvedilol (COREG) 3.125 MG tablet Take 1 tablet by mouth 2 (two) times daily.    Marland Kitchen docusate sodium (COLACE) 100 MG capsule Take 1 capsule (100 mg total) by mouth 2 (two) times daily. 10 capsule 0  . fenofibrate 160 MG tablet Take 160 mg by mouth at bedtime.     Marland Kitchen FLUoxetine (PROZAC) 10 MG capsule Take 1 capsule (10 mg total) by mouth daily.  3  . furosemide (LASIX) 20 MG tablet Take 1 tablet (20 mg total) by mouth daily as needed (if weight >200 lb). 30 tablet   . gabapentin (NEURONTIN) 300 MG capsule Take 300 mg by mouth 3 (three) times daily.    Marland Kitchen HUMALOG KWIKPEN 100 UNIT/ML KiwkPen Inject 30 Units into the skin 2 (two) times daily.     . insulin aspart (NOVOLOG) 100 UNIT/ML injection 0-9 Units, Subcutaneous, 3 times daily with meals CBG < 70: implement hypoglycemia protocol-call MD CBG 70 - 120: 0 units CBG 121 - 150: 1 unit CBG 151 - 200: 2 units CBG 201 - 250: 3 units CBG 251 - 300: 5 units CBG 301 - 350: 7 units CBG 351 - 400: 9 units CBG > 400: call MD 10 mL 11  . insulin glargine (LANTUS) 100 UNIT/ML injection Inject 30 units in am and 20 units at bedtime. (Patient taking differently: Inject into the skin. )    . isosorbide mononitrate (IMDUR) 30 MG 24 hr tablet Take 30 mg by mouth daily.    Marland Kitchen levothyroxine (SYNTHROID, LEVOTHROID) 100 MCG tablet Take 1 tablet  (100 mcg total) by mouth daily before breakfast.    . Menthol-Methyl Salicylate (MUSCLE RUB) 10-15 % CREA Apply 1 application topically 2 (two) times daily as needed for muscle pain. For right hip pain  0  . metoCLOPramide (REGLAN) 10 MG tablet Take 10 mg by mouth 4 (four) times daily.     . mometasone (NASONEX) 50 MCG/ACT nasal spray Place 2 sprays into the nose daily. 17 g 12  . mometasone-formoterol (DULERA) 200-5 MCG/ACT AERO Inhale 2 puffs into the lungs 2 (two) times daily.    . montelukast (SINGULAIR) 10 MG tablet Take 10  mg by mouth at bedtime.    . Multiple Vitamin (MULTIVITAMIN WITH MINERALS) TABS tablet Take 1 tablet by mouth daily. One A Day 50+    . nitroGLYCERIN (NITROSTAT) 0.4 MG SL tablet Place 1 tablet (0.4 mg total) under the tongue every 5 (five) minutes as needed for chest pain. 25 tablet 3  . OXYGEN Inhale 1 L into the lungs continuous.    . pantoprazole (PROTONIX) 40 MG tablet Take 1 tablet (40 mg total) by mouth daily.    . polyethylene glycol (MIRALAX / GLYCOLAX) packet Take 17 g by mouth every other day.     . rosuvastatin (CRESTOR) 20 MG tablet Take 1 tablet (20 mg total) by mouth daily. (Patient taking differently: Take 20 mg by mouth at bedtime. )    . ticagrelor (BRILINTA) 90 MG TABS tablet Take 1 tablet (90 mg total) by mouth 2 (two) times daily. 60 tablet   . tiZANidine (ZANAFLEX) 2 MG tablet Take 2 mg by mouth 3 (three) times daily.     Marland Kitchen topiramate (TOPAMAX) 50 MG tablet Take 50 mg by mouth 2 (two) times daily.     No facility-administered medications prior to visit.      Allergies:   Invokana [canagliflozin]; Nsaids; and Penicillins   Social History   Social History  . Marital status: Divorced    Spouse name: N/A  . Number of children: 2  . Years of education: N/A   Occupational History  . disabled Unemployed   Social History Main Topics  . Smoking status: Never Smoker  . Smokeless tobacco: Never Used  . Alcohol use No  . Drug use: No  . Sexual  activity: Not Currently    Birth control/ protection: Post-menopausal   Other Topics Concern  . None   Social History Narrative   Lives in [place] alone.  Retired.  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   Regular exercise: none   Caffeine use: daily; dt coke              Family History:  The patient's family history includes COPD in her father; Cirrhosis in her sister; Colon cancer (age of onset: 55) in her paternal aunt; Diabetes in her brother and mother; Emphysema in her father; Heart attack in her brother, mother, and paternal grandfather; Heart failure in her brother and mother; Hypertension in her brother; Kidney disease in her mother; Liver disease in her sister; Lung cancer in her brother; Stroke in her father.   ROS:   Please see the history of present illness.    No major complaints. Some decrease in memory. She is unsteady on her feet. Has sleep apnea and morbid obesity. History of right hemi-system. Diabetes has been under reasonable control.  All other systems reviewed and are negative.   PHYSICAL EXAM:   VS:  BP 110/60   Pulse 70   Ht 5\' 1"  (1.549 m)   Wt 199 lb (90.3 kg)   BMI 37.60 kg/m    GEN: Well nourished, well developed, in no acute distress . Obese. HEENT: normal  Neck: no JVD, carotid bruits, or masses Cardiac: RRR; no murmurs, rubs, or gallops,no edema  Respiratory:  clear to auscultation bilaterally, normal work of breathing GI: soft, nontender, nondistended, + BS MS: no deformity or atrophy  Skin: warm and dry, no rash Neuro:  Alert and Oriented x 3, Strength and sensation are intact Psych: euthymic mood, full affect  Wt Readings from Last 3 Encounters:  03/05/17  199 lb (90.3 kg)  03/04/17 201 lb (91.2 kg)  11/28/16 199 lb 1.6 oz (90.3 kg)      Studies/Labs Reviewed:   EKG:  EKG  Not repeated.  Recent Labs: 11/28/2016: ALT 22; B Natriuretic Peptide 797.4; Magnesium 2.0 12/03/2016: BUN 20; Creatinine, Ser 1.09; Hemoglobin  12.0; NT-Pro BNP 7,671; Platelets 308; Potassium 4.3; Sodium 139; TSH 2.020   Lipid Panel    Component Value Date/Time   CHOL 117 11/20/2016 0331   TRIG 225 (H) 11/20/2016 0331   HDL 23 (L) 11/20/2016 0331   CHOLHDL 5.1 11/20/2016 0331   VLDL 45 (H) 11/20/2016 0331   LDLCALC 49 11/20/2016 0331    Additional studies/ records that were reviewed today include:  Cardiac catheterization March 2018: Diagnostic Diagram       Post-Intervention Diagram        Images from 2015 coronary intervention and angiography were reviewed as were the most recent films from March 2018.   ASSESSMENT:    1. ST elevation myocardial infarction (STEMI) of inferolateral wall ([place])   2. Ischemic stroke ([place])   3. Benign essential HTN   4. Acute combined systolic and diastolic heart failure (H[facility])      PLAN:  In order of problems listed above:  1. Recurrent ST elevation presentation in the inferolateral region due to acute occlusion of the second branch of a large branching circumflex system. In 2015 she had acute occlusion of the first branch of the circumflex system. Both territories were treated with plain angioplasty. No stent implantations were performed because of the distal location. Moderate distal territory between occlusions was noted but LV systolic dysfunction associated with the events seemed out of proportion to the territory supplied by the vessels. She now denies angina. We will continue aspirin and Brilinta. Also continue Crestor therapy. 2. Not addressed 3. Blood pressure is very well controlled, currently on minimal therapy. 4. Acute systolic dysfunction related to coronary ischemic syndrome with a pattern suggestive of apical ballooning. 2015 was associated with reduction in LV function to approximately 30% that recovered back to normal LV function. Most recent episode in 2018 associated with LVEF less than 25% and a clear pattern suggestive of apical ballooning. Territory is  significantly larger than that associated with the occluded vessel. She will have repeat limited echo today to reassess LV function which based upon her medical regimen and clinical stability is likely improved. 5.  Continue low-dose beta blocker therapy. Consider ARB or ACE if LVEF less than 40%. Continue dual antiplatelet therapy until next spring. Six-month clinical follow-up. Call if recurrent angina.   Medication Adjustments/Labs and Tests Ordered: Current medicines are reviewed at length with the patient today.  Concerns regarding medicines are outlined above.  Medication changes, Labs and Tests ordered today are listed in the Patient Instructions below. Patient Instructions  Medication Instructions:  None  Labwork: None  Testing/Procedures: None  Follow-Up: Your physician recommends that you schedule a follow-up appointment in: 4-6 months with Dr. Tamala Julian.    Any Other Special Instructions Will Be Listed Below (If Applicable).     If you need a refill on your cardiac medications before your next appointment, please call your pharmacy.      Signed, Sinclair Grooms, MD  03/05/2017 1:31 PM    [facility] [address], [place], [place]  [address] Phone: [phone]; Fax: [phone]

## 2017-03-04 NOTE — Progress Notes (Signed)
SLEEP MEDICINE CLINIC   Provider:  Larey Seat, M D  Referring Provider: Marton Redwood, MD Primary Care Physician:  Marton Redwood, MD  Chief Complaint  Patient presents with  . OSA on CPAP    Reports feeling better since restarting her CPAP therapy.    HPI:  Danielle Vargas is a 66 y.o. female , Interval history from 03/03/2017. I have pleasure of seeing Danielle Vargas today in the presence of a caretaker. She had suffered a stroke following a myocardial infarction and has been on CPAP therapy. She tolerated CPAP well but I wanted to see if she may need a BiPAP setting. Be me today to see her how her compliance has been. She is using currently an auto set CPAP that allows a pressure between 10 and 20 cm water, she feels good with his machine she uses on average 4-1/2 hours at night her compliance by days is 80% but she has not managed every day to use it 4 hours or more. She has actively worked on not taking the mask off her face at night and seems to have done well over the last week or so. Her residual AHI is 6.3 and due to high air leaks there are probably less than 5 apneas left but these were erroneously counted. She is using oxygen to be bled into the CPAP and she uses this at the 95th percentile pressure of 15 cm water. We need to improve compliance.      seen here as a revisit from Dr Brigitte Pulse and Dr Erlinda Hong, The patient has been last seen in April 2016 in the sleep clinic. She is followed for diabetes by Dr. Benjiman Core, Dr. Carmie Kanner is her primary care physician, who referred her originally to me because she was excessively daytime sleepy, had asthmatic pulmonary wheezing, short of breath, and she was morbidly obese. She was diagnosed with an allergy to cat hair. Her comorbidities and past medical history include neuropathy, nephropathy and gastroparesis due to diabetes mellitus, positive GAD antibodies, colon polyps, osteoporosis, nephrolithiasis, malignant hypertension  and hyperlipidemia. Chronic depression, C-spine fusion and left rotator cuff repair surgeries. Severest Sleep Apnea, hypoxemia, nocturia. Epworth sleepiness score was endorsed at 18 points. Her sleep study revealed the highest AHI we had seen a long time at 142.9/ hr. . Sleep was not recorded in supine position.  CPAP was initiated at 6 and titrated to 15 cm water we put her on an autotitration. A nasal mask was fitted for the patient. By spring 2016 last year the patient was using her CPAP machine regularly. In the meantime she suffered a stroke which left her with left hemiparesis, she was admitted to [facility] no uses a wheelchair and a 4 pronged cane that she carries in her right, dominant hand. Her speech is unaffected. She is alert and awake. She can give me details of her recent hospitalization. She confesses that she has not been compliant with CPAP for several months. She stopped regular using it by mid spring 2017, but used it again during a hospitalization in July 2017. My nurse found the last use of CPAP in July and August 2017. After her stroke she had to move to a nursing facility. She reports that she has support there - but she needs remind the caretakers to assist her with using the CPAP. She was not using CPAP at the time she suffered her stroke. She does blame herself for it. The very last use of CPAP according  to her compliance data was in late July 2017 for 3 hours and 27 minutes with a residual AHI of 3.4 and a pressure of 18.6 cm water. Also she used to tolerate CPAP well, I think we may have to need to change her to a BiPAP machine.This is meant to give the patient comfort, her apneas are controlled on CPAP.    Dr. Erlinda Hong,  Due to the stroke and the left-sided hemiparesis there have been changes in her ability to turn in bed find a comfortable position she is now wheelchair bound., She can transfer with a cane.  Dr Read Drivers is treating her spasticity.  1) Danielle Vargas was diagnosed  in early 2015 with coronary artery disease and had a myocardial infarction in February. Soon after she was referred for a sleep study which was performed in Summer 2015 and she was diagnosed with one of the most severe apnea as I have seen. She was placed on an autotitrator CPAP with 3 cm EPR and initially tolerated the therapy very well. She became noncompliant over the month since April 2017, and suffered a stroke resulting in left hemiparesis and gait instability by July 1. She has been followed by my colleague Dr. Erlinda Hong. A neurovascular specialist.  I want urgently for the patient to resume her CPAP therapy and if she feels that she cannot tolerate the CPAP I would switch her to BiPAP. Her last use the dates confirmed that the machine is still effective in reducing her AHI to 3.4, a very desirable result, given the high baseline.    Review of Systems: Out of a complete 14 system review, the patient complains of only the following symptoms, and all other reviewed systems are negative.    Social History   Social History  . Marital status: Divorced    Spouse name: N/A  . Number of children: 2  . Years of education: N/A   Occupational History  . disabled Unemployed   Social History Main Topics  . Smoking status: Never Smoker  . Smokeless tobacco: Never Used  . Alcohol use No  . Drug use: No  . Sexual activity: Not Currently    Birth control/ protection: Post-menopausal   Other Topics Concern  . Not on file   Social History Narrative   Lives in [facility] alone.  Retired.  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   Regular exercise: none   Caffeine use: daily; dt coke             Family History  Problem Relation Age of Onset  . COPD Father   . Stroke Father   . Emphysema Father   . Heart failure Mother   . Heart attack Mother        several  . Diabetes Mother   . Kidney disease Mother   . Heart failure Brother   . Hypertension Brother   . Colon cancer  Unknown 45       mat. 1st cousin  . Other Unknown        celiac sprue, 1/2 brother  . Diabetes Brother        x 3  . Lung cancer Brother   . Cirrhosis Sister        liver transplant  . Liver disease Sister        transplant  . Heart attack Paternal Grandfather   . Heart attack Brother   . Colon cancer Paternal Aunt 63  . Esophageal cancer Neg Hx   .  Stomach cancer Neg Hx   . Rectal cancer Neg Hx     Past Medical History:  Diagnosis Date  . Acute combined systolic and diastolic heart failure ([place]) 11/19/2016  . Allergy   . Asthma   . CAD (coronary artery disease)    a. s/p lateral STEMI in 2015 2ry to occlusion of OM1 thought to be caused by coronary artery dissection, treated with POBA. b. 11/2016: STEMI with thrombotic occlusion of distal LCx which was treated with balloon angioplasty.   . Cataract    "just beginning"  . Colon polyps 7/07   hyperplastic and adenomatous  . Depression    pt unsure, psychologist said no  . Diverticulosis   . DM (diabetes mellitus) ([place])   . Dyslipidemia   . External hemorrhoid   . Fatty liver   . Gastroparesis   . HTN (hypertension)   . Hyperlipidemia   . 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 Hypothyroidism   . Migraine   . Myocardial infarction ([place])    FEb 2015  . Nephrolithiasis   . Obesity   . Osteoporosis   . Sleep apnea    wears CPAP  . ST elevation myocardial infarction (STEMI) of inferolateral wall ([place]) 11/19/2016  . Stroke [facility])     Past Surgical History:  Procedure Laterality Date  . BREAST REDUCTION SURGERY  '88  . CARPAL TUNNEL RELEASE  2000   right wrist  . CERVICAL DISC SURGERY  '98   fusion  . CESAREAN SECTION  '79  '84  . COLONOSCOPY    . CORONARY BALLOON ANGIOPLASTY N/A 11/19/2016   Procedure: Coronary Balloon Angioplasty;  Surgeon: Sherren Mocha, MD;  Location: [place] CV LAB;  Service: Cardiovascular;  Laterality: N/A;  . EP IMPLANTABLE DEVICE N/A 03/12/2016   Procedure: Loop Recorder Insertion;   Surgeon: Will Meredith Leeds, MD;  Location: [place] CV LAB;  Service: Cardiovascular;  Laterality: N/A;  . LEFT HEART CATH AND CORONARY ANGIOGRAPHY N/A 11/19/2016   Procedure: Left Heart Cath and Coronary Angiography;  Surgeon: Sherren Mocha, MD;  Location: [place] CV LAB;  Service: Cardiovascular;  Laterality: N/A;  . LEFT HEART CATHETERIZATION WITH CORONARY ANGIOGRAM N/A 10/26/2013   Procedure: LEFT HEART CATHETERIZATION WITH CORONARY ANGIOGRAM;  Surgeon: Sinclair Grooms, MD;  Location: [facility] CATH LAB;  Service: Cardiovascular;  Laterality: N/A;  . ROTATOR CUFF REPAIR  '09   left shoulder  . TEE WITHOUT CARDIOVERSION N/A 03/12/2016   Procedure: TRANSESOPHAGEAL ECHOCARDIOGRAM (TEE);  Surgeon: Sanda Klein, MD;  Location: [facility] ENDOSCOPY;  Service: Cardiovascular;  Laterality: N/A;  . TUBAL LIGATION  '85  . ULNAR NERVE REPAIR  '02, '08   left done, then right    Current Outpatient Prescriptions  Medication Sig Dispense Refill  . albuterol (PROVENTIL HFA;VENTOLIN HFA) 108 (90 BASE) MCG/ACT inhaler Inhale 2 puffs into the lungs every 6 (six) hours as needed for wheezing or shortness of breath.     Marland Kitchen aspirin EC 81 MG EC tablet Take 1 tablet (81 mg total) by mouth daily.    . carvedilol (COREG) 3.125 MG tablet Take 1 tablet by mouth 2 (two) times daily.    Marland Kitchen docusate sodium (COLACE) 100 MG capsule Take 1 capsule (100 mg total) by mouth 2 (two) times daily. 10 capsule 0  . fenofibrate 160 MG tablet Take 160 mg by mouth at bedtime.     Marland Kitchen FLUoxetine (PROZAC) 10 MG capsule Take 1 capsule (10 mg total) by mouth daily.  3  .  furosemide (LASIX) 20 MG tablet Take 1 tablet (20 mg total) by mouth daily as needed (if weight >200 lb). 30 tablet   . gabapentin (NEURONTIN) 300 MG capsule Take 300 mg by mouth 3 (three) times daily.    Marland Kitchen HUMALOG KWIKPEN 100 UNIT/ML KiwkPen Inject 30 Units into the skin 2 (two) times daily.     . insulin aspart (NOVOLOG) 100 UNIT/ML injection 0-9 Units, Subcutaneous, 3 times  daily with meals CBG < 70: implement hypoglycemia protocol-call MD CBG 70 - 120: 0 units CBG 121 - 150: 1 unit CBG 151 - 200: 2 units CBG 201 - 250: 3 units CBG 251 - 300: 5 units CBG 301 - 350: 7 units CBG 351 - 400: 9 units CBG > 400: call MD 10 mL 11  . insulin glargine (LANTUS) 100 UNIT/ML injection Inject 30 units in am and 20 units at bedtime. (Patient taking differently: Inject into the skin. )    . isosorbide mononitrate (IMDUR) 30 MG 24 hr tablet Take 30 mg by mouth daily.    Marland Kitchen levothyroxine (SYNTHROID, LEVOTHROID) 100 MCG tablet Take 1 tablet (100 mcg total) by mouth daily before breakfast.    . Menthol-Methyl Salicylate (MUSCLE RUB) 10-15 % CREA Apply 1 application topically 2 (two) times daily as needed for muscle pain. For right hip pain  0  . metoCLOPramide (REGLAN) 10 MG tablet Take 10 mg by mouth 4 (four) times daily.     . mometasone (NASONEX) 50 MCG/ACT nasal spray Place 2 sprays into the nose daily. 17 g 12  . mometasone-formoterol (DULERA) 200-5 MCG/ACT AERO Inhale 2 puffs into the lungs 2 (two) times daily.    . montelukast (SINGULAIR) 10 MG tablet Take 10 mg by mouth at bedtime.    . Multiple Vitamin (MULTIVITAMIN WITH MINERALS) TABS tablet Take 1 tablet by mouth daily. One A Day 50+    . nitroGLYCERIN (NITROSTAT) 0.4 MG SL tablet Place 1 tablet (0.4 mg total) under the tongue every 5 (five) minutes as needed for chest pain. 25 tablet 3  . OXYGEN Inhale 1 L into the lungs continuous.    . pantoprazole (PROTONIX) 40 MG tablet Take 1 tablet (40 mg total) by mouth daily.    . polyethylene glycol (MIRALAX / GLYCOLAX) packet Take 17 g by mouth every other day.     . rosuvastatin (CRESTOR) 20 MG tablet Take 1 tablet (20 mg total) by mouth daily. (Patient taking differently: Take 20 mg by mouth at bedtime. )    . ticagrelor (BRILINTA) 90 MG TABS tablet Take 1 tablet (90 mg total) by mouth 2 (two) times daily. 60 tablet   . tiZANidine (ZANAFLEX) 2 MG tablet Take 2 mg by mouth 3  (three) times daily.     Marland Kitchen topiramate (TOPAMAX) 50 MG tablet Take 50 mg by mouth 2 (two) times daily.     No current facility-administered medications for this visit.     Allergies as of 03/04/2017 - Review Complete 03/04/2017  Allergen Reaction Noted  . Invokana [canagliflozin] Diarrhea 09/02/2013  . Nsaids Other (See Comments) 02/08/2012  . Penicillins Rash 03/13/2015    Vitals: BP 138/65   Pulse 78   Ht 5\' 1"  (1.549 m)   Wt 201 lb (91.2 kg)   BMI 37.98 kg/m  Last Weight:  Wt Readings from Last 1 Encounters:  03/04/17 201 lb (91.2 kg)   GHW:EXHB mass index is 37.98 kg/m.     Last Height:  She had a weight of 201.4  pounds today at the nursing home. Ht Readings from Last 1 Encounters:  03/04/17 5\' 1"  (1.549 m)    Physical exam:  General: The patient is awake, alert and appears not in acute distress. The patient is well groomed. Head: Normocephalic, atraumatic. Neck is supple. Mallampati 3 , uvula is midline. There is no facial asymmetry noted neck circumference: 17. Nasal airflow patent,  Retrognathia is seen.  Cardiovascular:  Regular rate and rhythm, without  murmurs or carotid bruit, and without distended neck veins. Respiratory: Lungs are not congested.  Skin:  Without evidence of edema, or rash Trunk: BMI is superobese .    The patient has the marks of a loop recorder implant, she does not show any injuries bruising.  She has symmetric pupils, not this rounded, normal extraocular eye movements, no ptosis, no facial droop, uvula and tongue were in midline, no chin tremor noted, left shoulder is droopy, left arm weaker, beginning spasticity in the left hand, she can fully extend her left lower extremity dorsiflex and plantarflex her feet, brisk upgoing Babinski response, she can elevate the left arm but not fully extended, she does have grip strength weakness, pinch weakness in the left hand. She can walk up to 150 feet with 4 pronged cane @!  The patient demonstrated  her gait with left hemiparesis using a 4 pronged cane in her right hand she has a minimal step width,  and did not present with a wide based gait. Her left foot is everted. She sits in a wheelchair today, but is able to transfer with assistance.  Strong up-going Babinski.   The patient was advised of the nature of the diagnosed sleep disorder , the treatment options and risks for general a health and wellness arising from not treating the condition.  I spent more than 25 minutes of face to face time with the patient. Greater than 50% of time was spent in counseling and coordination of care. We have discussed the diagnosis and differential and I answered the patient's questions.     Assessment:  After physical and neurologic examination, review of laboratory studies,  Personal review of imaging studies, reports of other /same  Imaging studies ,  Results of polysomnography/ neurophysiology testing and pre-existing records as far as provided in visit., my assessment is     Plan:  Treatment plan and additional workup :  OSA :Order new supplies, patient to resume CPAP auto therapy, discussed compliance. RV with NP in 90 days. If needed, switch to BiPAP. She will have to use his CPAP every night from now on and I will put a note with [facility]. I wrote a note to assist her with CPAP use on weekends-   Rv in 12 month .    Asencion Partridge Iolani Twilley MD  03/04/2017  CC; Dr Erlinda Hong CC: Marton Redwood, [address], [place] [address]

## 2017-03-05 ENCOUNTER — Ambulatory Visit (HOSPITAL_COMMUNITY): Payer: Medicare Other | Attending: Cardiology

## 2017-03-05 ENCOUNTER — Ambulatory Visit (INDEPENDENT_AMBULATORY_CARE_PROVIDER_SITE_OTHER): Payer: Medicare Other | Admitting: Interventional Cardiology

## 2017-03-05 ENCOUNTER — Other Ambulatory Visit: Payer: Self-pay

## 2017-03-05 ENCOUNTER — Encounter: Payer: Self-pay | Admitting: Interventional Cardiology

## 2017-03-05 ENCOUNTER — Encounter: Payer: Self-pay | Admitting: Cardiology

## 2017-03-05 ENCOUNTER — Ambulatory Visit: Payer: Medicare Other | Admitting: Interventional Cardiology

## 2017-03-05 VITALS — BP 110/60 | HR 70 | Ht 61.0 in | Wt 199.0 lb

## 2017-03-05 DIAGNOSIS — I2119 ST elevation (STEMI) myocardial infarction involving other coronary artery of inferior wall: Secondary | ICD-10-CM | POA: Diagnosis not present

## 2017-03-05 DIAGNOSIS — E119 Type 2 diabetes mellitus without complications: Secondary | ICD-10-CM | POA: Diagnosis not present

## 2017-03-05 DIAGNOSIS — Z8673 Personal history of transient ischemic attack (TIA), and cerebral infarction without residual deficits: Secondary | ICD-10-CM | POA: Insufficient documentation

## 2017-03-05 DIAGNOSIS — I639 Cerebral infarction, unspecified: Secondary | ICD-10-CM | POA: Diagnosis not present

## 2017-03-05 DIAGNOSIS — I2510 Atherosclerotic heart disease of native coronary artery without angina pectoris: Secondary | ICD-10-CM | POA: Insufficient documentation

## 2017-03-05 DIAGNOSIS — I255 Ischemic cardiomyopathy: Secondary | ICD-10-CM

## 2017-03-05 DIAGNOSIS — E669 Obesity, unspecified: Secondary | ICD-10-CM | POA: Insufficient documentation

## 2017-03-05 DIAGNOSIS — I5022 Chronic systolic (congestive) heart failure: Secondary | ICD-10-CM | POA: Insufficient documentation

## 2017-03-05 DIAGNOSIS — I959 Hypotension, unspecified: Secondary | ICD-10-CM | POA: Diagnosis not present

## 2017-03-05 DIAGNOSIS — I10 Essential (primary) hypertension: Secondary | ICD-10-CM | POA: Diagnosis not present

## 2017-03-05 DIAGNOSIS — I110 Hypertensive heart disease with heart failure: Secondary | ICD-10-CM | POA: Insufficient documentation

## 2017-03-05 DIAGNOSIS — R55 Syncope and collapse: Secondary | ICD-10-CM

## 2017-03-05 DIAGNOSIS — E785 Hyperlipidemia, unspecified: Secondary | ICD-10-CM | POA: Insufficient documentation

## 2017-03-05 DIAGNOSIS — I213 ST elevation (STEMI) myocardial infarction of unspecified site: Secondary | ICD-10-CM | POA: Diagnosis not present

## 2017-03-05 DIAGNOSIS — I5041 Acute combined systolic (congestive) and diastolic (congestive) heart failure: Secondary | ICD-10-CM

## 2017-03-05 LAB — ECHOCARDIOGRAM LIMITED
Height: 61 in
Weight: 3184 oz

## 2017-03-05 MED ORDER — PERFLUTREN LIPID MICROSPHERE
1.0000 mL | INTRAVENOUS | Status: AC | PRN
  Administered 2017-03-05: 2 mL via INTRAVENOUS

## 2017-03-05 NOTE — Patient Instructions (Signed)
Medication Instructions:  None  Labwork: None  Testing/Procedures: None  Follow-Up: Your physician recommends that you schedule a follow-up appointment in: 4-6 months with Dr. Tamala Julian.    Any Other Special Instructions Will Be Listed Below (If Applicable).     If you need a refill on your cardiac medications before your next appointment, please call your pharmacy.

## 2017-03-09 ENCOUNTER — Ambulatory Visit (INDEPENDENT_AMBULATORY_CARE_PROVIDER_SITE_OTHER): Payer: Medicare Other | Admitting: *Deleted

## 2017-03-09 DIAGNOSIS — I639 Cerebral infarction, unspecified: Secondary | ICD-10-CM

## 2017-03-09 NOTE — Progress Notes (Signed)
[facility] Summary Report / Loop Recorder 

## 2017-03-13 ENCOUNTER — Telehealth: Payer: Self-pay | Admitting: Sports Medicine

## 2017-03-13 NOTE — Telephone Encounter (Signed)
Per voicemail left from pt ([id number]) she needed to reschedule her appt for 7.17.18 to 7.9.18...  lvm for pt to call to r/s appt.

## 2017-03-18 ENCOUNTER — Ambulatory Visit (INDEPENDENT_AMBULATORY_CARE_PROVIDER_SITE_OTHER): Payer: Medicare Other | Admitting: Podiatry

## 2017-03-18 ENCOUNTER — Telehealth: Payer: Self-pay | Admitting: *Deleted

## 2017-03-18 DIAGNOSIS — B351 Tinea unguium: Secondary | ICD-10-CM

## 2017-03-18 DIAGNOSIS — M79676 Pain in unspecified toe(s): Secondary | ICD-10-CM

## 2017-03-18 DIAGNOSIS — E0842 Diabetes mellitus due to underlying condition with diabetic polyneuropathy: Secondary | ICD-10-CM

## 2017-03-18 DIAGNOSIS — L600 Ingrowing nail: Secondary | ICD-10-CM

## 2017-03-18 MED ORDER — SULFAMETHOXAZOLE-TRIMETHOPRIM 800-160 MG PO TABS: 1.0000 | ORAL_TABLET | Freq: Two times a day (BID) | ORAL | 0 refills | Status: DC

## 2017-03-18 MED ORDER — MUPIROCIN CALCIUM 2 % EX CREA: TOPICAL_CREAM | Freq: Two times a day (BID) | CUTANEOUS | Status: AC

## 2017-03-18 NOTE — Telephone Encounter (Signed)
Called and would need to get the pharmacy so I can call Rx in. Danielle Vargas

## 2017-03-24 ENCOUNTER — Encounter: Payer: Self-pay | Admitting: Physical Medicine & Rehabilitation

## 2017-03-24 ENCOUNTER — Ambulatory Visit (HOSPITAL_BASED_OUTPATIENT_CLINIC_OR_DEPARTMENT_OTHER): Payer: Medicare Other | Admitting: Physical Medicine & Rehabilitation

## 2017-03-24 ENCOUNTER — Encounter: Payer: Medicare Other | Attending: Physical Medicine & Rehabilitation

## 2017-03-24 ENCOUNTER — Ambulatory Visit: Payer: Medicare Other | Admitting: Sports Medicine

## 2017-03-24 VITALS — BP 92/62 | HR 71

## 2017-03-24 DIAGNOSIS — G8929 Other chronic pain: Secondary | ICD-10-CM | POA: Diagnosis present

## 2017-03-24 DIAGNOSIS — G8114 Spastic hemiplegia affecting left nondominant side: Secondary | ICD-10-CM | POA: Diagnosis not present

## 2017-03-24 DIAGNOSIS — E669 Obesity, unspecified: Secondary | ICD-10-CM | POA: Diagnosis not present

## 2017-03-24 DIAGNOSIS — G8110 Spastic hemiplegia affecting unspecified side: Secondary | ICD-10-CM

## 2017-03-24 DIAGNOSIS — Z5189 Encounter for other specified aftercare: Secondary | ICD-10-CM | POA: Insufficient documentation

## 2017-03-24 DIAGNOSIS — E785 Hyperlipidemia, unspecified: Secondary | ICD-10-CM | POA: Diagnosis not present

## 2017-03-24 DIAGNOSIS — I69354 Hemiplegia and hemiparesis following cerebral infarction affecting left non-dominant side: Secondary | ICD-10-CM | POA: Insufficient documentation

## 2017-03-24 DIAGNOSIS — E119 Type 2 diabetes mellitus without complications: Secondary | ICD-10-CM | POA: Insufficient documentation

## 2017-03-24 DIAGNOSIS — I255 Ischemic cardiomyopathy: Secondary | ICD-10-CM

## 2017-03-24 DIAGNOSIS — I10 Essential (primary) hypertension: Secondary | ICD-10-CM | POA: Insufficient documentation

## 2017-03-24 NOTE — Progress Notes (Signed)
Subjective:    Patient ID: Danielle Vargas, female    DOB: Apr 25, 1951, 66 y.o.   MRN: [id number]  HPI F/u from Botox 6 wks LEFT NON Dominant Biceps50 FCR25 FCU25 FDS75 FDP25  Patient had no adverse effects. Her arm stays on the arm trough. No excessive finger curling or wrist flexion.  Asian feels like her arm is quite relaxed Pain Inventory Average Pain 0 Pain Right Now 0 My pain is na  In the last 24 hours, has pain interfered with the following? General activity 0 Relation with others 0 Enjoyment of life 0 What TIME of day is your pain at its worst? evening Sleep (in general) Good  Pain is worse with: pressure Pain improves with: rest Relief from Meds: na  Mobility walk with assistance use a cane ability to climb steps?  yes do you drive?  no use a wheelchair needs help with transfers  Function disabled: date disabled 7/17  Neuro/Psych bowel control problems weakness trouble walking  Prior Studies Any changes since last visit?  no  Physicians involved in your care Any changes since last visit?  no   Family History  Problem Relation Age of Onset  . COPD Father   . Stroke Father   . Emphysema Father   . Heart failure Mother   . Heart attack Mother        several  . Diabetes Mother   . Kidney disease Mother   . Heart failure Brother   . Hypertension Brother   . Colon cancer Unknown 45       mat. 1st cousin  . Other Unknown        celiac sprue, 1/2 brother  . Diabetes Brother        x 3  . Lung cancer Brother   . Cirrhosis Sister        liver transplant  . Liver disease Sister        transplant  . Heart attack Paternal Grandfather   . Heart attack Brother   . Colon cancer Paternal Aunt 42  . Esophageal cancer Neg Hx   . Stomach cancer Neg Hx   . Rectal cancer Neg Hx    Social History   Social History  . Marital status: Divorced    Spouse name: N/A  . Number of children: 2  . Years of education: N/A   Occupational History    . disabled Unemployed   Social History Main Topics  . Smoking status: Never Smoker  . Smokeless tobacco: Never Used  . Alcohol use No  . Drug use: No  . Sexual activity: Not Currently    Birth control/ protection: Post-menopausal   Other Topics Concern  . Not on file   Social History Narrative   Lives in [facility] alone.  Retired.  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   Regular exercise: none   Caffeine use: daily; dt coke            Past Surgical History:  Procedure Laterality Date  . BREAST REDUCTION SURGERY  '88  . CARPAL TUNNEL RELEASE  2000   right wrist  . CERVICAL DISC SURGERY  '98   fusion  . CESAREAN SECTION  '79  '84  . COLONOSCOPY    . CORONARY BALLOON ANGIOPLASTY N/A 11/19/2016   Procedure: Coronary Balloon Angioplasty;  Surgeon: Sherren Mocha, MD;  Location: [facility] CV LAB;  Service: Cardiovascular;  Laterality: N/A;  . EP IMPLANTABLE DEVICE N/A 03/12/2016  Procedure: Loop Recorder Insertion;  Surgeon: Will Meredith Leeds, MD;  Location: [place] CV LAB;  Service: Cardiovascular;  Laterality: N/A;  . LEFT HEART CATH AND CORONARY ANGIOGRAPHY N/A 11/19/2016   Procedure: Left Heart Cath and Coronary Angiography;  Surgeon: Sherren Mocha, MD;  Location: [place] CV LAB;  Service: Cardiovascular;  Laterality: N/A;  . LEFT HEART CATHETERIZATION WITH CORONARY ANGIOGRAM N/A 10/26/2013   Procedure: LEFT HEART CATHETERIZATION WITH CORONARY ANGIOGRAM;  Surgeon: Sinclair Grooms, MD;  [facility] CATH LAB;  Service: Cardiovascular;  Laterality: N/A;  . ROTATOR CUFF REPAIR  '09   left shoulder  . TEE WITHOUT CARDIOVERSION N/A 03/12/2016   Procedure: TRANSESOPHAGEAL ECHOCARDIOGRAM (TEE);  Surgeon: Sanda Klein, MD;  Location: [facility] ENDOSCOPY;  Service: Cardiovascular;  Laterality: N/A;  . TUBAL LIGATION  '85  . ULNAR NERVE REPAIR  '02, '08   left done, then right   Past Medical History:  Diagnosis Date  . Acute combined systolic and diastolic heart  failure ([place]) 11/19/2016  . Allergy   . Asthma   . CAD (coronary artery disease)    a. s/p lateral STEMI in 2015 2ry to occlusion of OM1 thought to be caused by coronary artery dissection, treated with POBA. b. 11/2016: STEMI with thrombotic occlusion of distal LCx which was treated with balloon angioplasty.   . Cataract    "just beginning"  . Colon polyps 7/07   hyperplastic and adenomatous  . Depression    pt unsure, psychologist said no  . Diverticulosis   . DM (diabetes mellitus) ([place])   . Dyslipidemia   . External hemorrhoid   . Fatty liver   . Gastroparesis   . HTN (hypertension)   . Hyperlipidemia   . 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 Hypothyroidism   . Migraine   . Myocardial infarction ([place])    FEb 2015  . Nephrolithiasis   . Obesity   . Osteoporosis   . Sleep apnea    wears CPAP  . ST elevation myocardial infarction (STEMI) of inferolateral wall ([place]) 11/19/2016  . Stroke [facility])    There were no vitals taken for this visit.  Opioid Risk Score:   Fall Risk Score:  `1  Depression screen PHQ 2/9  Depression screen [facility] 2/9 10/09/2016 07/15/2016 09/21/2015 09/18/2014 12/26/2013  Decreased Interest 0 0 0 1 0  Down, Depressed, Hopeless 0 0 0 1 1  PHQ - 2 Score 0 0 0 2 1  Some recent data might be hidden     Review of Systems  Constitutional: Negative.   HENT: Negative.   Eyes: Negative.   Respiratory: Negative.   Cardiovascular: Negative.   Gastrointestinal: Negative.   Endocrine: Negative.   Genitourinary: Negative.   Musculoskeletal: Negative.   Skin: Negative.   Allergic/Immunologic: Negative.   Neurological: Negative.   Hematological: Negative.   Psychiatric/Behavioral: Negative.   All other systems reviewed and are negative.      Objective:   Physical Exam  Constitutional: She is oriented to person, place, and time. She appears well-developed and well-nourished.  HENT:  Head: Normocephalic and atraumatic.  Eyes: Pupils are equal, round,  and reactive to light. Conjunctivae and EOM are normal.  Neurological: She is alert and oriented to person, place, and time.  Psychiatric: She has a normal mood and affect.  Nursing note and vitals reviewed.   Left elbow flexors. Ashworth grade 2. Left wrist flexors. Ashworth 0 Left finger flexors. Ashworth 0 Left thumb flexor. Ashworth 1 Motor strength  is 2 minus at the shoulder adductors and elbow flexors.        Assessment & Plan:  1. 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  She is in a skilled nursing facility since she requires physical assistance for ADLs and mobility, no family members, able to provide this level of care.  She has had good results with Botox injections as noted above. Would continue current dosing. Repeat in 6 weeks

## 2017-03-24 NOTE — Patient Instructions (Signed)
Repeat same dose of Botox same muscle groups in 6 weeks

## 2017-03-25 ENCOUNTER — Telehealth: Payer: Self-pay | Admitting: *Deleted

## 2017-03-25 LAB — CUP PACEART REMOTE DEVICE CHECK
Implantable Pulse Generator Implant Date: 20170705
MDC IDC SESS DTM: 20180630174658

## 2017-03-25 NOTE — Telephone Encounter (Signed)
-----   Message from Burtis Junes, NP sent at 03/05/2017  8:40 PM EDT ----- Patient seen by Dr. Tamala Julian today. EF has recovered.  Copy of echo to the facility See back as planned and otherwise continue with current regimen as outlined by Dr. Tamala Julian

## 2017-03-25 NOTE — Telephone Encounter (Signed)
I called the SNF and s/w pt's nurse today Caren Griffins that I was calling the pt to go over her echo results. I advised per Dr. Tamala Julian to fax a copy of results to SNF. Fax # given to me today is [phone] ATT: Caren Griffins, RN on [employer] . I then s/w pt who has been notified of her test results as well. Pt asked if I would call her daughter Joellen Jersey with the results. I then s/w pt's daughter Joellen [place] who has also been advised of test results for pt. Daughter thanked me for the call as well.

## 2017-03-26 ENCOUNTER — Encounter: Payer: Self-pay | Admitting: Internal Medicine

## 2017-03-26 ENCOUNTER — Ambulatory Visit (INDEPENDENT_AMBULATORY_CARE_PROVIDER_SITE_OTHER): Payer: Medicare Other | Admitting: Internal Medicine

## 2017-03-26 VITALS — BP 124/84 | HR 81 | Wt 203.0 lb

## 2017-03-26 DIAGNOSIS — E109 Type 1 diabetes mellitus without complications: Secondary | ICD-10-CM | POA: Diagnosis not present

## 2017-03-26 DIAGNOSIS — I255 Ischemic cardiomyopathy: Secondary | ICD-10-CM | POA: Diagnosis not present

## 2017-03-26 DIAGNOSIS — E139 Other specified diabetes mellitus without complications: Secondary | ICD-10-CM

## 2017-03-26 LAB — POCT GLYCOSYLATED HEMOGLOBIN (HGB A1C): HEMOGLOBIN A1C: 8

## 2017-03-26 MED ORDER — INSULIN GLARGINE 100 UNIT/ML ~~LOC~~ SOLN: SUBCUTANEOUS | 11 refills | Status: DC

## 2017-03-26 MED ORDER — INSULIN REGULAR HUMAN 100 UNIT/ML IJ SOLN: INTRAMUSCULAR | 11 refills | Status: DC

## 2017-03-26 NOTE — Progress Notes (Signed)
Patient ID: Danielle Vargas, female   DOB: 12-Nov-1950, 66 y.o.   MRN: [id number]  HPI: Danielle Vargas is a 66 y.o.-year-old woman, returning for f/u for of DM - LADA dx 1999, insulin-dependent since 10/2012, uncontrolled, with complications (CAD- s/p STEMI 10/2013, STEMI 2018, cerebro-vascular ds. 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 Last visit 4 months ago. She is here with her caregiver. She has [name].  She has an ingrown toenail >> on ABx (Dr. Amalia Hailey, podiatry).  She started to cut back on snacks in last mo.  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  She is on: - Lantus 30 units in am and 20 at bedtime - Novolog:  Before b'fast: 30 units  Before lunch: 35 units  Before dinner: 30 units If sugars before a meal 70-90, then only give Novolog 10 units before that meal. If sugars before a meal <70, then skip Novolog before that meal.  She has been on Glipizide in the past, too. She tried Invokana in the past >> severe diarrhea Also tried U500 >> could not afford this.  Patient has her sugars checked 4 times a day at the facility >> higher: - am: 161-240, 351 >> 99, 128-172, 178, 198 >> 180-303 >> ? >> 156-264 >> 172-248, 383 - 2h after b'fast:  114-207 >> 115-205, 239 >> n/c - before lunch: 148-221 >> 216-328 >> 101, 140s >> 104-179, 251 >> 275-354, 404 - 2h after lunch: 83, 126-199 >> 57x1, 99-247 >> n/c - before dinner: 101-238 >> 77, 110-246, 272 >> 190-292 >> ? >> 191-306 >> 178-302, 384 - 2h after dinner:  111-214 >> 109-273, 339 >> n/c - bedtime: 126-378 >> n/c >> 180-295 >> 130-140s >> 135-227, 283  >> 231-364 - nighttime: 161-310 >> n/c >> 75-232 >> 136-291 >> n/c few lows - lowest 77 >> 100 >> 172; she has hypoglycemia awareness at 95.   Highest sugar was 286 >> 180 >> 404.  Pt's meals are: - Breakfast: 2 slices of bread + PB - Lunch: sandwich;  salad + cheese + croutons;  baked potato >> pinto beans + cornbread - Dinner: leftovers or corn bread + pinto beans + occas. Cole slaw >> salad + lite dressing + saltines  - + Mild CKD, last BUN/creatinine:  Lab Results  Component Value Date   BUN 20 12/03/2016   CREATININE 1.09 (H) 12/03/2016  She is on losartan. - She has HL:  Lab Results  Component Value Date   CHOL 117 11/20/2016   HDL 23 (L) 11/20/2016   LDLCALC 49 11/20/2016   TRIG 225 (H) 11/20/2016   CHOLHDL 5.1 11/20/2016  02/2015: TG 252 (PCP). She is on fenofibrate. On Crestor 20. - last eye exam was in 02/2017 - Dr Zigmund Daniel. R>L DR. unchanged. - She has numbness and tingling in her feet. Continues Neurontin.  She also has a history of HTN, HL, fatty liver, GERD, morbid obesity, hypothyroidism, migraines, chronic back pain, history of nephrolithiasis, osteoporosis, diverticulosis.  She has OSA >> on CPAP.  She also had pancreatitis (03/21/2015).  She has been admitted for CP 11/13/2015 >> r/o for AMI. She saw Dr Sallyanne Kuster in the hospital,  her cardiologist is Dr. Tamala Julian. 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 She is still in a wheelchair. Continues to live in [facility].  ROS: Constitutional: no weight gain/no weight loss, no fatigue, no subjective hyperthermia, no subjective hypothermia Eyes: no blurry vision, no xerophthalmia ENT: no sore throat, no nodules palpated in throat, no dysphagia, no odynophagia, no hoarseness Cardiovascular: no CP/no SOB/no palpitations/no leg swelling Respiratory: no cough/no SOB/no wheezing Gastrointestinal: no N/no V/no D/no C/no acid reflux Musculoskeletal: no muscle aches/no joint aches Skin: no rashes, no hair loss Neurological: no  tremors/no numbness/no tingling/no dizziness - paresis in L arm  I reviewed pt's medications, allergies, PMH, social hx, family hx, and changes were documented in the history of present illness. Otherwise, unchanged from my initial visit note.  PE: BP 124/84 (BP Location: Left Arm, Patient Position: Sitting)   Pulse 81   Wt 203 lb (92.1 kg)   SpO2 94%   BMI 38.36 kg/m  Body mass index is 38.36 kg/m. Wt Readings from Last 3 Encounters:  03/26/17 203 lb (92.1 kg)  03/05/17 199 lb (90.3 kg)  03/04/17 201 lb (91.2 kg)   Constitutional: Obese, in NAD, in wheelchair Eyes: PERRLA, EOMI, no exophthalmos ENT: moist mucous membranes, no thyromegaly, no cervical lymphadenopathy Cardiovascular: RRR, No MRG Respiratory: CTA B Gastrointestinal: abdomen soft, NT, ND, BS+ Musculoskeletal: + L arm and leg edematous, strength intact in all 4 Skin: moist, warm, no rashes Neurological: no tremor with outstretched hands,  L hemiparesis,  ASSESSMENT: 1. DM2, insulin-dependent, uncontrolled, with complications - CAD - STEMI 10/26/2013 - had angioplasty, no stent placed. Had 2DEcho on 01/23/2014 EF increased (from 35) to 55%.             - STEMI 11/19/2016. - cerebro-vascular ds. 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  PLAN:  1. Patient with long-standing Uncontrolled diabetes, with worse control at this visit.She has been eating more snacks, which she recently started to cut back on, and she also has an infection in her toe,   for which she is on antibiotics. However, I do not feel that these are the sole reasons for her higher sugars.  - we will need to increase her insulin doses. I suggested to increase  both Lantus and mealtime insulin  - This month, per records reviewed today from SNF (which patient brings with her), she was  switched from NovoLog to Novolin R, which is okay, especially due to her gastroparesis. We'll continue the Novolin R, but will increase her doses.  -  I suggested:  Patient Instructions  Please increase: - Lantus 40 units in am and 30 at bedtime - Novolin R:  Before b'fast: 40 units  Before lunch: 40 units  Before dinner: 35 units  If sugars before a meal 70-90, then only give Novolog 15 units before that meal.  If sugars before a meal <70, then skip Novolog before that meal.  Please return in 3 months with your sugar log.    - today, HbA1c is 8% (higher) - continue checking sugars at different times of the day - check 4x a day, rotating checks - advised for yearly eye exams >> she is UTD - Return to clinic in 3 mo with sugar log   Philemon Kingdom, MD PhD [facility]

## 2017-03-26 NOTE — Patient Instructions (Addendum)
Please increase: - Lantus 40 units in am and 30 at bedtime - Novolin R:  Before b'fast: 40 units  Before lunch: 40 units  Before dinner: 35 units   If sugars before a meal 70-90, then only give Novolog 15 units before that meal.  If sugars before a meal <70, then skip Novolog before that meal.  Please return in 3 months with your sugar log.

## 2017-03-29 NOTE — Progress Notes (Signed)
   SUBJECTIVE Patient with a history of diabetes mellitus presents to office today complaining of elongated, thickened nails. Pain while ambulating in shoes. Patient is unable to trim their own nails. The patient also complains of the symptomatic ingrown toenail to the right great toe with some localized cellulitis. Patient denies drainage. This been going on for a few months now.  OBJECTIVE General Patient is awake, alert, and oriented x 3 and in no acute distress. Derm Skin is dry and supple bilateral. Negative open lesions or macerations. Remaining integument unremarkable. Nails are tender, long, thickened and dystrophic with subungual debris, consistent with onychomycosis, 1-5 bilateral. No signs of infection noted. Vasc  DP and PT pedal pulses palpable bilaterally. Temperature gradient within normal limits.  Neuro Epicritic and protective threshold sensation diminished bilaterally.  Musculoskeletal Exam No symptomatic pedal deformities noted bilateral. Muscular strength within normal limits.  ASSESSMENT 1. Diabetes Mellitus w/ peripheral neuropathy 2. Onychomycosis of nail due to dermatophyte bilateral 3. Ingrowing toenail right great toe with some localized cellulitis  PLAN OF CARE 1. Patient evaluated today. 2. Instructed to maintain good pedal hygiene and foot care. Stressed importance of controlling blood sugar.  3. Mechanical debridement of nails 1-5 bilaterally performed using a nail nipper. Filed with dremel without incident.  4. Today prescription for Bactrim DS was provided  5. Prescription for mupirocin ointment  6. Return to clinic in 2 weeks.     Edrick Kins, DPM [facility]  Dr. Edrick Kins, [address]                                        [place], [place] [address]                Office [phone]  Fax [phone]

## 2017-04-06 ENCOUNTER — Ambulatory Visit (INDEPENDENT_AMBULATORY_CARE_PROVIDER_SITE_OTHER): Payer: Medicare Other | Admitting: *Deleted

## 2017-04-06 DIAGNOSIS — I639 Cerebral infarction, unspecified: Secondary | ICD-10-CM

## 2017-04-07 ENCOUNTER — Ambulatory Visit (INDEPENDENT_AMBULATORY_CARE_PROVIDER_SITE_OTHER): Payer: Medicare Other | Admitting: Podiatry

## 2017-04-07 DIAGNOSIS — L600 Ingrowing nail: Secondary | ICD-10-CM | POA: Diagnosis not present

## 2017-04-07 NOTE — Progress Notes (Signed)
   Subjective: Patient presents today for evaluation of pain in toe(s). Patient is concerned for possible ingrown nail. Patient states that the pain has been present for a few weeks now. Patient presents today for further treatment and evaluation.  Objective:  General: Well developed, nourished, in no acute distress, alert and oriented x3   Dermatology: Skin is warm, dry and supple bilateral. Lateral border of the right great toe appears to be erythematous with evidence of an ingrowing nail. Pain on palpation noted to the border of the nail fold. The remaining nails appear unremarkable at this time. There are no open sores, lesions.  Vascular: Dorsalis Pedis artery and Posterior Tibial artery pedal pulses palpable. No lower extremity edema noted.   Neruologic: Grossly intact via light touch bilateral.  Musculoskeletal: Muscular strength within normal limits in all groups bilateral. Normal range of motion noted to all pedal and ankle joints.   Assesement: #1 Paronychia with ingrowing nail lateral border right great toe #2 Pain in toe #3 Incurvated nail  Plan of Care:  1. Patient evaluated.  2. Discussed treatment alternatives and plan of care. Explained nail avulsion procedure and post procedure course to patient. 3. Patient opted for permanent partial nail avulsion.  4. Prior to procedure, local anesthesia infiltration utilized using 3 ml of a 50:50 mixture of 2% plain lidocaine and 0.5% plain marcaine in a normal hallux block fashion and a betadine prep performed.  5. Partial permanent nail avulsion with chemical matrixectomy performed using [id number] applications of phenol followed by alcohol flush.  6. Light dressing applied. 7. Return to clinic in 2 weeks.   Edrick Kins, DPM [facility]  Dr. Edrick Kins, [address]                                        [place], [place] [address]                Office [phone]  Fax [phone]

## 2017-04-07 NOTE — Progress Notes (Signed)
[facility] Summary Report / Loop Recorder 

## 2017-04-07 NOTE — Patient Instructions (Signed)

## 2017-04-18 LAB — CUP PACEART REMOTE DEVICE CHECK
Date Time Interrogation Session: 20180730180942
MDC IDC PG IMPLANT DT: 20170705

## 2017-04-18 NOTE — Progress Notes (Signed)
Carelink summary report received. Battery status OK. Normal device function. No new symptom episodes, tachy episodes, brady, or pause episodes. No new AF episodes. Monthly summary reports and ROV/PRN 

## 2017-04-21 ENCOUNTER — Ambulatory Visit (INDEPENDENT_AMBULATORY_CARE_PROVIDER_SITE_OTHER): Payer: Medicare Other | Admitting: Podiatry

## 2017-04-21 DIAGNOSIS — L600 Ingrowing nail: Secondary | ICD-10-CM

## 2017-04-23 ENCOUNTER — Telehealth: Payer: Self-pay

## 2017-04-23 ENCOUNTER — Telehealth: Payer: Self-pay | Admitting: Internal Medicine

## 2017-04-23 NOTE — Telephone Encounter (Signed)
Resident nurse calling. Request "Poc[place]ontas" when calling. Patient's bs is currently 434.  Please advise.  Ty,  -LL

## 2017-04-23 NOTE — Telephone Encounter (Signed)
Ok to give 10 units of Novolog now

## 2017-04-23 NOTE — Telephone Encounter (Signed)
Please advise, patient hall nurse called and states blood sugar is 434, how to proceed? Thank you!

## 2017-04-23 NOTE — Telephone Encounter (Signed)
MD sent a note. Will await response.

## 2017-04-24 ENCOUNTER — Telehealth: Payer: Self-pay

## 2017-04-24 NOTE — Telephone Encounter (Signed)
Called nursing center, spoke with nurse; advised of note if it reached 434, to give an extra 10 units of novolog. Nurse understood. Faxed over written orders.

## 2017-04-24 NOTE — Telephone Encounter (Signed)
Called and notified nursing center.  Faxing over orders.

## 2017-04-27 NOTE — Progress Notes (Signed)
   Subjective: Patient presents today 2 weeks post ingrown nail permanent nail avulsion procedure. Patient states that the toe and nail fold is feeling much better.  Objective: Skin is warm, dry and supple. Nail and respective nail fold appears to be healing appropriately. Open wound to the associated nail fold with a granular wound base and moderate amount of fibrotic tissue. Minimal drainage noted. Mild erythema around the periungual region likely due to phenol chemical matricectomy.  Assessment: #1 postop permanent partial nail avulsion lateral border right great toe #2 open wound periungual nail fold of respective digit.   Plan of care: #1 patient was evaluated  #2 debridement of open wound was performed to the periungual border of the respective toe using a currette. Antibiotic ointment and Band-Aid was applied. #3 patient is to return to clinic on a PRN  basis.   Edrick Kins, DPM [facility]  Dr. Edrick Kins, [address]                                        [place], [place] [address]                Office [phone]  Fax [phone]

## 2017-05-05 ENCOUNTER — Encounter: Payer: Medicare Other | Attending: Physical Medicine & Rehabilitation

## 2017-05-05 ENCOUNTER — Ambulatory Visit (HOSPITAL_BASED_OUTPATIENT_CLINIC_OR_DEPARTMENT_OTHER): Payer: Medicare Other | Admitting: Physical Medicine & Rehabilitation

## 2017-05-05 ENCOUNTER — Encounter: Payer: Self-pay | Admitting: Physical Medicine & Rehabilitation

## 2017-05-05 VITALS — BP 134/78 | HR 81 | Resp 14

## 2017-05-05 DIAGNOSIS — I10 Essential (primary) hypertension: Secondary | ICD-10-CM | POA: Diagnosis not present

## 2017-05-05 DIAGNOSIS — Z5189 Encounter for other specified aftercare: Secondary | ICD-10-CM | POA: Insufficient documentation

## 2017-05-05 DIAGNOSIS — I69354 Hemiplegia and hemiparesis following cerebral infarction affecting left non-dominant side: Secondary | ICD-10-CM | POA: Insufficient documentation

## 2017-05-05 DIAGNOSIS — E785 Hyperlipidemia, unspecified: Secondary | ICD-10-CM | POA: Diagnosis not present

## 2017-05-05 DIAGNOSIS — E669 Obesity, unspecified: Secondary | ICD-10-CM | POA: Insufficient documentation

## 2017-05-05 DIAGNOSIS — G8929 Other chronic pain: Secondary | ICD-10-CM | POA: Diagnosis present

## 2017-05-05 DIAGNOSIS — G8110 Spastic hemiplegia affecting unspecified side: Secondary | ICD-10-CM

## 2017-05-05 DIAGNOSIS — E119 Type 2 diabetes mellitus without complications: Secondary | ICD-10-CM | POA: Insufficient documentation

## 2017-05-05 NOTE — Patient Instructions (Signed)

## 2017-05-05 NOTE — Progress Notes (Signed)
Botox Injection for spasticity using needle EMG guidance  Dilution: 50 Units/ml Indication: Severe spasticity which interferes with ADL,mobility and/or  hygiene and is unresponsive to medication management and other conservative care Informed consent was obtained after describing risks and benefits of the procedure with the patient. This includes bleeding, bruising, infection, excessive weakness, or medication side effects. A REMS form is on file and signed. Needle: 27g 1" needle electrode Number of units per muscle Biceps50 FCR25 FCU25 FDS75 FDP25,  pronator teres 25 Pronator quadratus, 25  All injections were done after obtaining appropriate EMG activity and after negative drawback for blood. The patient tolerated the procedure well. Post procedure instructions were given. A followup appointment was made.

## 2017-05-06 ENCOUNTER — Ambulatory Visit (INDEPENDENT_AMBULATORY_CARE_PROVIDER_SITE_OTHER): Payer: Medicare Other | Admitting: *Deleted

## 2017-05-06 DIAGNOSIS — I639 Cerebral infarction, unspecified: Secondary | ICD-10-CM | POA: Diagnosis not present

## 2017-05-07 LAB — CUP PACEART REMOTE DEVICE CHECK
Date Time Interrogation Session: 20180830014124
Implantable Pulse Generator Implant Date: 20170705

## 2017-05-07 NOTE — Progress Notes (Signed)
[facility] Summary Report / Loop Recorder 

## 2017-05-13 ENCOUNTER — Telehealth: Payer: Self-pay

## 2017-05-13 NOTE — Telephone Encounter (Signed)
Spoke with Collie Siad at [facility] to give her a verbal order from Dr. Cruzita Lederer- the instructions were as follows: Lantus 50 units in the morning and 40 units at bedtime              Novolin R 50 units before breakfast and lunch and 45 units before dinner  I have also changed this in the patients medication list

## 2017-06-05 ENCOUNTER — Ambulatory Visit (INDEPENDENT_AMBULATORY_CARE_PROVIDER_SITE_OTHER): Payer: Medicare Other | Admitting: *Deleted

## 2017-06-05 DIAGNOSIS — I639 Cerebral infarction, unspecified: Secondary | ICD-10-CM

## 2017-06-08 NOTE — Progress Notes (Signed)
[facility] Summary Report / Loop Recorder 

## 2017-06-09 LAB — CUP PACEART REMOTE DEVICE CHECK
Implantable Pulse Generator Implant Date: 20170705
MDC IDC SESS DTM: 20180929021439

## 2017-06-16 ENCOUNTER — Ambulatory Visit (HOSPITAL_BASED_OUTPATIENT_CLINIC_OR_DEPARTMENT_OTHER): Payer: Medicare Other | Admitting: Physical Medicine & Rehabilitation

## 2017-06-16 ENCOUNTER — Encounter: Payer: Medicare Other | Attending: Physical Medicine & Rehabilitation

## 2017-06-16 VITALS — BP 125/89 | HR 89 | Resp 14

## 2017-06-16 DIAGNOSIS — I69354 Hemiplegia and hemiparesis following cerebral infarction affecting left non-dominant side: Secondary | ICD-10-CM | POA: Insufficient documentation

## 2017-06-16 DIAGNOSIS — E669 Obesity, unspecified: Secondary | ICD-10-CM | POA: Diagnosis not present

## 2017-06-16 DIAGNOSIS — Z5189 Encounter for other specified aftercare: Secondary | ICD-10-CM | POA: Insufficient documentation

## 2017-06-16 DIAGNOSIS — I10 Essential (primary) hypertension: Secondary | ICD-10-CM | POA: Diagnosis not present

## 2017-06-16 DIAGNOSIS — I255 Ischemic cardiomyopathy: Secondary | ICD-10-CM | POA: Diagnosis not present

## 2017-06-16 DIAGNOSIS — G8929 Other chronic pain: Secondary | ICD-10-CM | POA: Insufficient documentation

## 2017-06-16 DIAGNOSIS — G8110 Spastic hemiplegia affecting unspecified side: Secondary | ICD-10-CM

## 2017-06-16 DIAGNOSIS — E785 Hyperlipidemia, unspecified: Secondary | ICD-10-CM | POA: Diagnosis not present

## 2017-06-16 DIAGNOSIS — E119 Type 2 diabetes mellitus without complications: Secondary | ICD-10-CM | POA: Diagnosis not present

## 2017-06-16 NOTE — Patient Instructions (Signed)
I will plan to repeat the same treatment as this recent Botox injection performed  05/05/2017. Same muscle groups. Same dosing.  Return in 6 weeks for the repeat treatment

## 2017-06-16 NOTE — Progress Notes (Signed)
Subjective:    Patient ID: Danielle Vargas, female    DOB: 1951/01/18, 66 y.o.   MRN: [id number]  HPI 66 year old female with history of right MCA infarct in July 2017 Patient returns today 6 weeks after Botox injection performed  05/05/2017 No post injection complications. She had excellent response. According to her. She is at the [facility] facility    Pain Inventory Average Pain 0 Pain Right Now 0 My pain is no pain  In the last 24 hours, has pain interfered with the following? General activity 0 Relation with others 0 Enjoyment of life 0 What TIME of day is your pain at its worst? no pain Sleep (in general) Good  Pain is worse with: no pain Pain improves with: no pain Relief from Meds: no pain  Mobility use a wheelchair  Function disabled: date disabled .  Neuro/Psych No problems in this area  Prior Studies Any changes since last visit?  no  Physicians involved in your care Any changes since last visit?  no   Family History  Problem Relation Age of Onset  . COPD Father   . Stroke Father   . Emphysema Father   . Heart failure Mother   . Heart attack Mother        several  . Diabetes Mother   . Kidney disease Mother   . Heart failure Brother   . Hypertension Brother   . Colon cancer Unknown 45       mat. 1st cousin  . Other Unknown        celiac sprue, 1/2 brother  . Diabetes Brother        x 3  . Lung cancer Brother   . Cirrhosis Sister        liver transplant  . Liver disease Sister        transplant  . Heart attack Paternal Grandfather   . Heart attack Brother   . Colon cancer Paternal Aunt 57  . Esophageal cancer Neg Hx   . Stomach cancer Neg Hx   . Rectal cancer Neg Hx    Social History   Social History  . Marital status: Divorced    Spouse name: N/A  . Number of children: 2  . Years of education: N/A   Occupational History  . disabled Unemployed   Social History Main Topics  . Smoking status: Never Smoker  .  Smokeless tobacco: Never Used  . Alcohol use No  . Drug use: No  . Sexual activity: Not Currently    Birth control/ protection: Post-menopausal   Other Topics Concern  . Not on file   Social History Narrative   Lives in [facility] alone.  Retired.  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   Regular exercise: none   Caffeine use: daily; dt coke            Past Surgical History:  Procedure Laterality Date  . BREAST REDUCTION SURGERY  '88  . CARPAL TUNNEL RELEASE  2000   right wrist  . CERVICAL DISC SURGERY  '98   fusion  . CESAREAN SECTION  '79  '84  . COLONOSCOPY    . CORONARY BALLOON ANGIOPLASTY N/A 11/19/2016   Procedure: Coronary Balloon Angioplasty;  Surgeon: Sherren Mocha, MD;  Location: [facility];  Service: Cardiovascular;  Laterality: N/A;  . EP IMPLANTABLE DEVICE N/A 03/12/2016   Procedure: Loop Recorder Insertion;  Surgeon: Will Meredith Leeds, MD;  Location: [facility];  Service: Cardiovascular;  Laterality: N/A;  . LEFT HEART CATH AND CORONARY ANGIOGRAPHY N/A 11/19/2016   Procedure: Left Heart Cath and Coronary Angiography;  Surgeon: Sherren Mocha, MD;  Location: [facility];  Service: Cardiovascular;  Laterality: N/A;  . LEFT HEART CATHETERIZATION WITH CORONARY ANGIOGRAM N/A 10/26/2013   Procedure: LEFT HEART CATHETERIZATION WITH CORONARY ANGIOGRAM;  Surgeon: Sinclair Grooms, MD;  Location: [facility] CATH LAB;  Service: Cardiovascular;  Laterality: N/A;  . ROTATOR CUFF REPAIR  '09   left shoulder  . TEE WITHOUT CARDIOVERSION N/A 03/12/2016   Procedure: TRANSESOPHAGEAL ECHOCARDIOGRAM (TEE);  Surgeon: Sanda Klein, MD;  Location: [facility] ENDOSCOPY;  Service: Cardiovascular;  Laterality: N/A;  . TUBAL LIGATION  '85  . ULNAR NERVE REPAIR  '02, '08   left done, then right   Past Medical History:  Diagnosis Date  . 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 Allergy   . Asthma   . CAD (coronary artery disease)    a.  s/p lateral STEMI in 2015 2ry to occlusion of OM1 thought to be caused by coronary artery dissection, treated with POBA. b. 11/2016: STEMI with thrombotic occlusion of distal LCx which was treated with balloon angioplasty.   . Cataract    "just beginning"  . Colon polyps 7/07   hyperplastic and adenomatous  . Depression    pt unsure, psychologist said no  . Diverticulosis   . DM (diabetes mellitus) ([place])   . Dyslipidemia   . External hemorrhoid   . Fatty liver   . Gastroparesis   . HTN (hypertension)   . Hyperlipidemia   . 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 Hypothyroidism   . Migraine   . Myocardial infarction ([place])    FEb 2015  . Nephrolithiasis   . Obesity   . Osteoporosis   . Sleep apnea    wears CPAP  . ST elevation myocardial infarction (STEMI) of inferolateral wall ([place]) 11/19/2016  . Stroke ([place])    BP 125/89 (BP Location: Right Arm, Patient Position: Sitting, Cuff Size: Large)   Pulse 89   Resp 14   SpO2 94%   Opioid Risk Score:   Fall Risk Score:  `1  Depression screen PHQ 2/9  Depression screen [facility] 2/9 10/09/2016 07/15/2016 09/21/2015 09/18/2014 12/26/2013  Decreased Interest 0 0 0 1 0  Down, Depressed, Hopeless 0 0 0 1 1  PHQ - 2 Score 0 0 0 2 1  Some recent data might be hidden    Review of Systems  Constitutional: Negative.   HENT: Negative.   Eyes: Negative.   Respiratory: Negative.   Cardiovascular: Negative.   Gastrointestinal: Negative.   Endocrine: Negative.   Genitourinary: Negative.   Musculoskeletal: Negative.   Skin: Negative.   Allergic/Immunologic: Negative.   Neurological: Negative.   Hematological: Negative.   Psychiatric/Behavioral: Negative.   All other systems reviewed and are negative.      Objective:   Physical Exam  Constitutional: She is oriented to person, place, and time. She appears well-developed and well-nourished.  HENT:  Head: Normocephalic and atraumatic.  Eyes: Pupils are equal, round, and reactive to  light. Conjunctivae and EOM are normal.  Neurological: She is alert and oriented to person, place, and time.  Motor strength is 0/5 in the left deltoid, biceps, triceps, grip Tone Modified [place], 0 at the left elbow flexors, wrist flexors, finger flexors   Psychiatric: She has a normal mood and affect.  Nursing note and vitals reviewed.  Assessment & Plan:  1. Left spastic hemiplegia, improved after botulinum toxin injection performed on 05/05/2017. As discussed with the patient effects will gradually diminished over the next 6-7 weeks, will schedule for reinjection, same dose same muscle groups Biceps50 FCR25 FCU25 FDS75 FDP25,  pronator teres 25 Pronator quadratus, 25

## 2017-06-23 ENCOUNTER — Encounter: Payer: Self-pay | Admitting: Internal Medicine

## 2017-06-23 ENCOUNTER — Ambulatory Visit (INDEPENDENT_AMBULATORY_CARE_PROVIDER_SITE_OTHER): Payer: Medicare Other | Admitting: Internal Medicine

## 2017-06-23 VITALS — BP 122/74 | HR 74

## 2017-06-23 DIAGNOSIS — I255 Ischemic cardiomyopathy: Secondary | ICD-10-CM | POA: Diagnosis not present

## 2017-06-23 DIAGNOSIS — E109 Type 1 diabetes mellitus without complications: Secondary | ICD-10-CM | POA: Diagnosis not present

## 2017-06-23 DIAGNOSIS — E139 Other specified diabetes mellitus without complications: Secondary | ICD-10-CM

## 2017-06-23 LAB — POCT GLYCOSYLATED HEMOGLOBIN (HGB A1C): HEMOGLOBIN A1C: 8

## 2017-06-23 MED ORDER — INSULIN REGULAR HUMAN 100 UNIT/ML IJ SOLN: INTRAMUSCULAR | 11 refills | Status: DC

## 2017-06-23 MED ORDER — INSULIN GLARGINE 100 UNIT/ML ~~LOC~~ SOLN: SUBCUTANEOUS | 11 refills | Status: DC

## 2017-06-23 NOTE — Addendum Note (Signed)
Addended by: Caprice Beaver T on: 06/23/2017 11:24 AM   Modules accepted: Orders

## 2017-06-23 NOTE — Progress Notes (Signed)
Patient ID: Danielle Vargas, female   DOB: 12-Apr-1951, 66 y.o.   MRN: [id number]  HPI: Danielle Vargas is a 66 y.o.-year-old woman, returning for f/u for of DM - LADA dx 1999, insulin-dependent since 10/2012, uncontrolled, with complications (CAD- s/p STEMI 10/2013, STEMI 2018, cerebro-vascular ds. 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 Last visit 3 months ago. She is here with her caregiver. She has Danielle Vargas.  She is now in a SNF after her stroke.   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  She is on: - Lantus 40 >> 50 units in am and 30 >> 45 at bedtime - Novolin R:  Before b'fast: 40 units >> 50  Before lunch: 40 units >> 50  Before dinner: 35 units >> 45 If sugars before a meal 70-90, then only give Novolog 15 units before that meal. If sugars before a meal <70, then skip Novolog before that meal.  She has been on Glipizide in the past, too. She tried Invokana in the past >> severe diarrhea Also tried U500 >> could not afford this.  Patient has her sugars checked 4x a day at the facility: - am:  156-264 >> 172-248, 383 >> 102, 129-228 in last wee, prev: 173-374 - 2h after b'fast: 115-205, 239 >> n/c - before lunch: 104-179, 251 >> 275-354, 404 >> 91-249, 292 in last week, before: 201-335 - 2h after lunch: 83, 126-199 >> 57x1, 99-247 >> n/c - before dinner:  191-306 >> 178-302, 384 >> 199-326 in last week, prev. 222-359 - 2h after dinner:  111-214 >> 109-273, 339 >> n/c - bedtime: 135-227, 283 >> 231-364 >> 208-348 - nighttime: 75-232 >> 136-291 >> n/c Lowest 172 >> 91 x1; she has hypoglycemia awareness at  95.   Highest sugar was 434 x1.  Pt's meals are: - Breakfast: 2 slices of bread + PB - Lunch: sandwich;  salad + cheese + croutons;  baked potato >> pinto beans + cornbread - Dinner: leftovers or corn bread + pinto beans + occas. Cole slaw >> salad + lite dressing + saltines  - + mild CKD, last BUN/creatinine:  Lab Results  Component Value Date   BUN 20 12/03/2016   CREATININE 1.09 (H) 12/03/2016  On Losartan. - She has HL:  Lab Results  Component Value Date   CHOL 117 11/20/2016   HDL 23 (L) 11/20/2016   LDLCALC 49 11/20/2016   TRIG 225 (H) 11/20/2016   CHOLHDL 5.1 11/20/2016  02/2015: TG 252 (PCP). She is on fenofibrate and Crestor - last eye exam was in 02/2017 >> + R>L DR (unchanged) - Dr Zigmund Daniel.  - + numbness and tingling in her feet. Continues Neurontin.  She also has a history of HTN, HL, fatty liver, GERD, morbid obesity, hypothyroidism, migraines, chronic back pain, history of nephrolithiasis, osteoporosis, diverticulosis.  She has OSA >> on CPAP.  She also had pancreatitis (03/21/2015).  She has been admitted for CP 11/13/2015 >> r/o for AMI. She saw Dr Sallyanne Kuster in the hospital, her cardiologist is Dr. Tamala Julian. 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 She is still in a wheelchair. Continues to live in [name].  She also has hypothyroidism  -  normal TSH at last check: Lab Results  Component Value Date   TSH 2.020 12/03/2016   ROS: Constitutional: no weight gain/no weight loss, no fatigue, no subjective hyperthermia, no subjective hypothermia Eyes: no blurry vision, no xerophthalmia ENT: no sore throat, no nodules palpated in throat, no dysphagia, no odynophagia, no hoarseness Cardiovascular: no CP/no SOB/no palpitations/no leg swelling Respiratory: no cough/no SOB/no wheezing Gastrointestinal: no N/no V/no D/no C/no acid reflux Musculoskeletal: no muscle aches/no joint aches Skin: no rashes, no hair loss Neurological: no tremors/+ numbness/+ tingling/no dizziness-  paresis in L arm  I reviewed pt's medications, allergies, PMH, social hx, family hx, and changes were documented in the history of present illness. Otherwise, unchanged from my initial visit note.  PE: BP 122/74 (BP Location: Left Arm, Patient Position: Sitting)   Pulse 74   SpO2 97%  There is no height or weight on file to calculate BMI. Wt Readings from Last 3 Encounters:  03/26/17 203 lb (92.1 kg)  03/05/17 199 lb (90.3 kg)  03/04/17 201 lb (91.2 kg)   Constitutional: overweight, in NAD, in wheelchair Eyes: PERRLA, EOMI, no exophthalmos ENT: moist mucous membranes, no thyromegaly, no cervical lymphadenopathy Cardiovascular: RRR, No MRG Respiratory: CTA B Gastrointestinal: abdomen soft, NT, ND, BS+ Musculoskeletal: no deformities, strength intact in all 4 Skin: moist, warm, no rashes Neurological: no tremor with outstretched hands, + L hemiparesis  ASSESSMENT: 1. DM2, insulin-dependent, uncontrolled, with complications - CAD - STEMI 10/26/2013 - had angioplasty, no stent placed. Had 2DEcho on 01/23/2014 EF increased (from 35) to 55%.             - STEMI 11/19/2016. - cerebro-vascular ds. 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  2. Obesity  PLAN:  1. Patient with long-standingUncontrolled diabetes, with slightly better control in the last week, especially for the sugars in a.m. and before lunch. Her sugars before dinner are still quite high, and subsequently she has hyperglycemia at bedtime. Therefore, we will continue the current dose of Lantus and will increase The regular insulin dose with lunch. I also advised her to work on lunch and reduce the intake of fat and concentrated sweets. She is unfortunately immobile (in wheelchair) that contribute to her severe insulin resistance. Since she is in a SNF we  cannot use the more concentrated, U500 insulin, and she requires high doses of U100 insulin that are not easily absorbed. She inquires about the GLP-1 receptor agonist, however, I advised her that this is contraindicated with a history of pancreatitis and also with gastroparesis.  - The facility sending me her sugar logs every 2 weeks, so I plan to adjust the dose is based on how she responds to the above change   -  I suggested:  Patient Instructions  Please continue: - Lantus 50 units in am and 45 at bedtime  Please change: - Novolin R:  Before b'fast: 50 units  Before lunch: 50 >> 55 units  Before dinner: 45 units If sugars before a meal 70-90, then only give Novolog 20 units before that meal. If sugars before a meal <70, then skip Novolog before that meal.  Please return in 3 months with your sugar log.   - today, HbA1c is 8% (stable) - continue checking sugars at different times of the day - check  4x a day, rotating checks - advised for yearly eye exams >> she is UTD - Return to clinic in 3 mo with sugar log   2. Obesity - class 3 -  discussed improving her diet, but unfortunately we cannot use GLP-1 receptor agonist or SGLT2 inhibitors and we do have to increase her insulin because of high insulin resistance possibly also related to immobility   Philemon Kingdom, MD PhD [facility]

## 2017-06-23 NOTE — Patient Instructions (Addendum)
Please continue: - Lantus 50 units in am and 45 at bedtime  Please change: - Novolin R:  Before b'fast: 50 units  Before lunch: 50 >> 55 units  Before dinner: 45 units If sugars before a meal 70-90, then only give Novolog 20 units before that meal. If sugars before a meal <70, then skip Novolog before that meal.  Please return in 3 months with your sugar log.

## 2017-06-24 ENCOUNTER — Encounter: Payer: Self-pay | Admitting: Interventional Cardiology

## 2017-06-24 ENCOUNTER — Ambulatory Visit: Payer: Medicare Other | Admitting: Internal Medicine

## 2017-06-24 ENCOUNTER — Telehealth: Payer: Self-pay | Admitting: Cardiology

## 2017-06-24 NOTE — Telephone Encounter (Signed)
LMOVM requesting that pt send manual transmission b/c home monitor has not updated in at least 14 days.    

## 2017-07-02 ENCOUNTER — Encounter: Payer: Self-pay | Admitting: Cardiology

## 2017-07-04 IMAGING — CT CT ABD-PELV W/ CM
2 of 4 series · 10 of 46 positions shown, 11 images · IV contrast (omnipaque)
Comparison: Ultrasound dated 03/21/2015 and CT dated 03/01/2012

CLINICAL DATA: 63-year-old female with right upper quadrant
abdominal pain

EXAM:
CT ABDOMEN AND PELVIS WITH CONTRAST
TECHNIQUE: Multidetector CT imaging of the abdomen and pelvis was performed
using the standard protocol following bolus administration of
intravenous contrast.
CONTRAST:  100mL OMNIPAQUE IOHEXOL 300 MG/ML  SOLN

[Series 201: routine, idose (2) · axial · 0.98mm/px · z∈[-218,+112]mm · 7 of 82 slices shown, 8 images]
[im 8/82  soft-tissue]
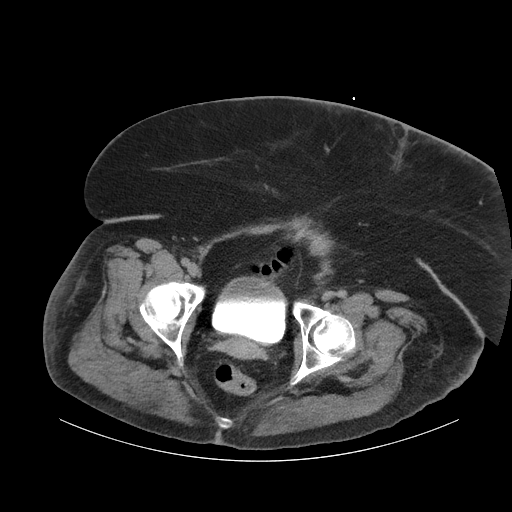
[im 8/82  bone]
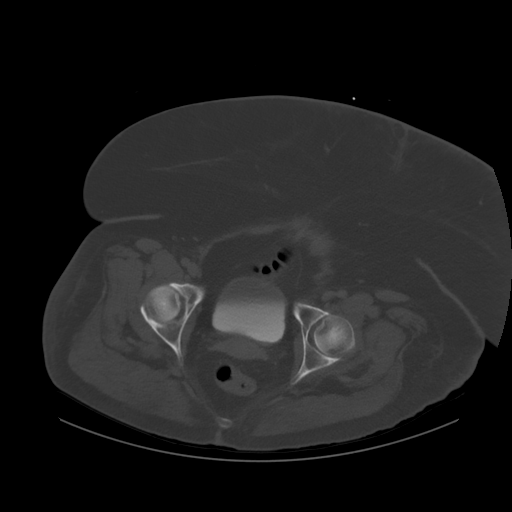
[im 19/82  soft-tissue]
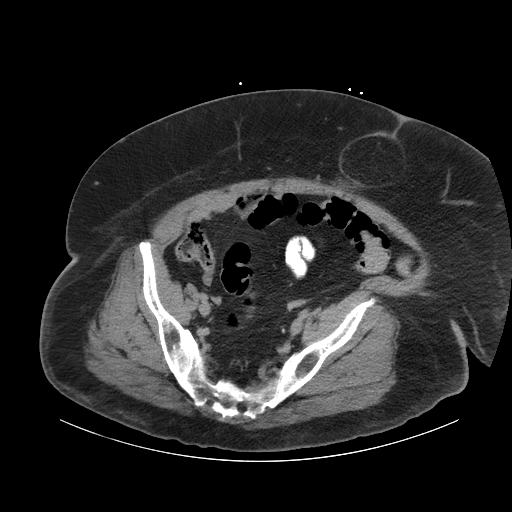
[im 30/82  soft-tissue]
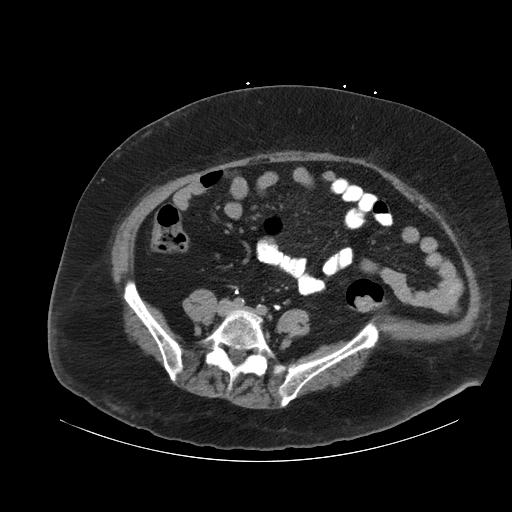
[im 41/82  soft-tissue]
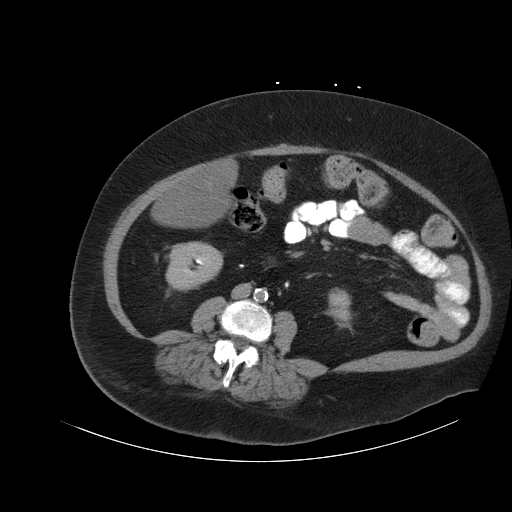
[im 52/82  soft-tissue]
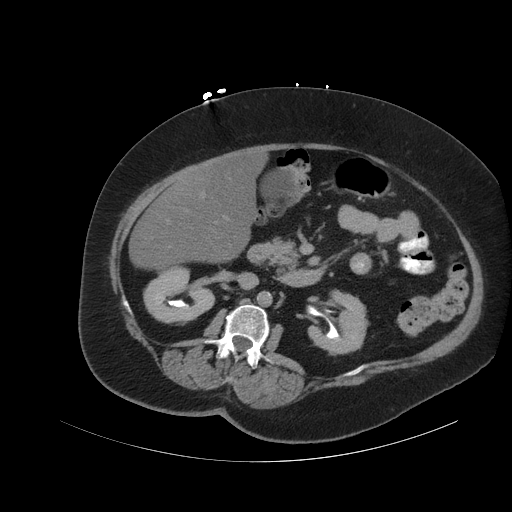
[im 63/82  soft-tissue]
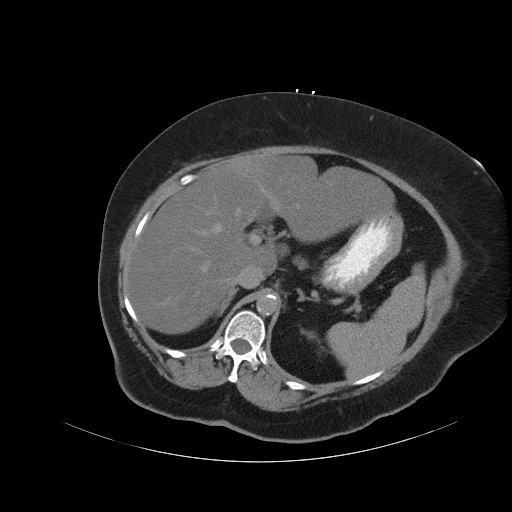
[im 74/82  soft-tissue]
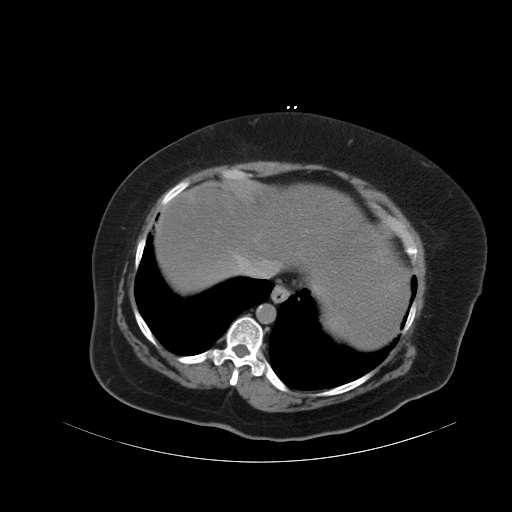

[Series 203: coronals, idose (2) · coronal · 0.45mm/px · 3 of 158 slices shown]
[im 53/158  soft-tissue]
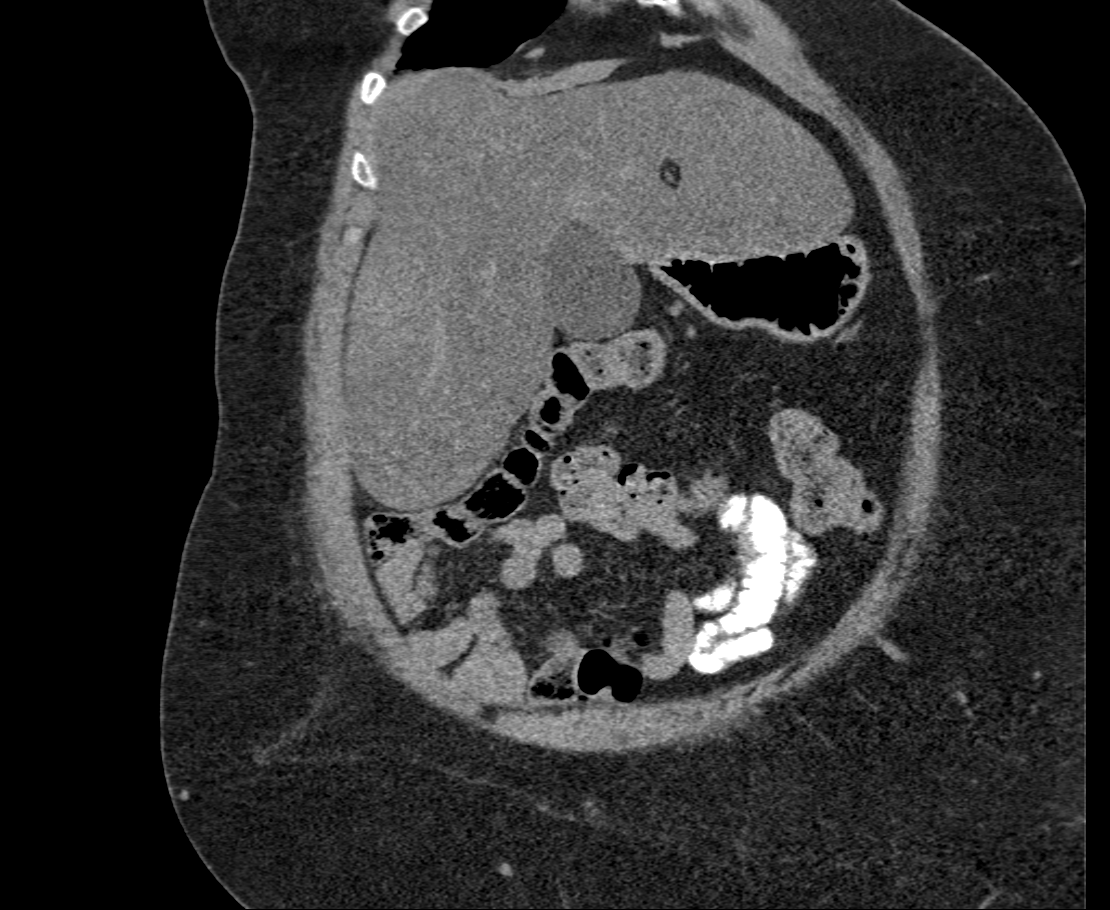
[im 70/158  soft-tissue]
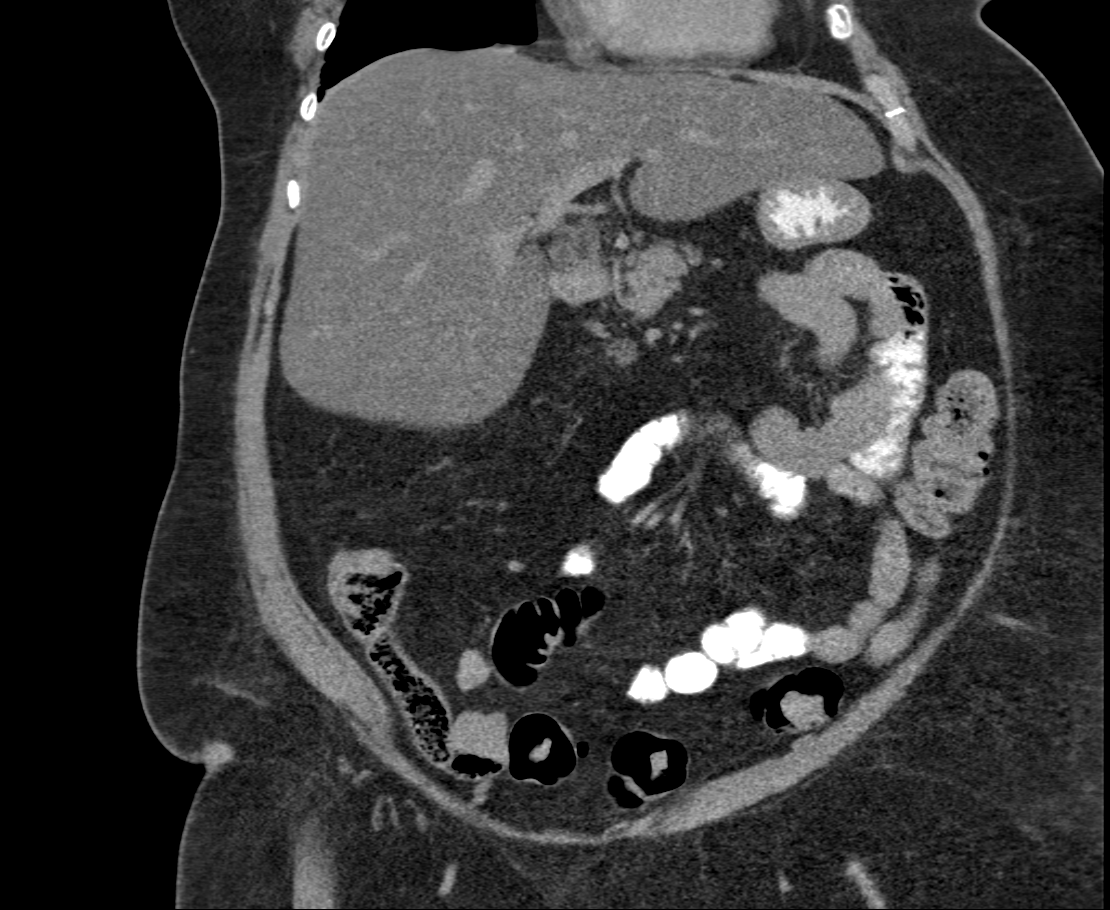
[im 88/158  soft-tissue]
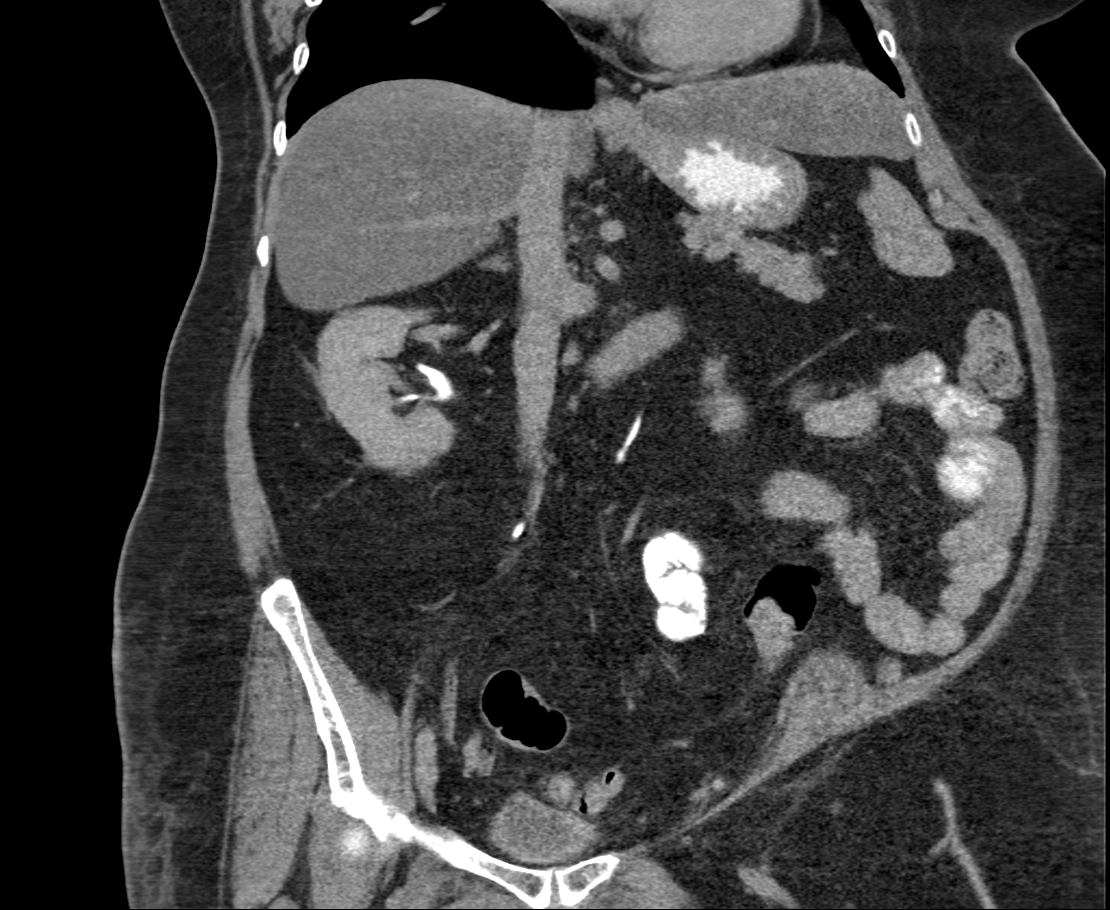

[10 of 46 positions shown; findings below may reference images not displayed]

FINDINGS: The visualized lung bases are clear no no intra-abdominal free air
or free fluid.

Diffuse hepatic steatosis. The gallbladder, pancreas, spleen,
adrenal glands, kidneys, visualized ureters, and urinary bladder
appear unremarkable. Bilateral tubal ligation clips noted. The
uterus is grossly unremarkable.

There is sigmoid diverticulosis without active inflammation.
Constipation. No bowel obstruction. Normal appendix.

There is aortoiliac atherosclerotic disease. The visualized
abdominal aorta and IVC appear unremarkable. No portal venous gas
identified. There is no lymphadenopathy.

There is a fat containing umbilical hernia without evidence of
inflammation or incarceration. Mild degenerative changes of the
spine. No acute fracture.
IMPRESSION: No acute intra-abdominal or pelvic pathology. No gallstone or
inflammatory changes of the gallbladder.

## 2017-07-06 ENCOUNTER — Ambulatory Visit (INDEPENDENT_AMBULATORY_CARE_PROVIDER_SITE_OTHER): Payer: Medicare Other | Admitting: *Deleted

## 2017-07-06 DIAGNOSIS — I639 Cerebral infarction, unspecified: Secondary | ICD-10-CM | POA: Diagnosis not present

## 2017-07-07 ENCOUNTER — Ambulatory Visit (INDEPENDENT_AMBULATORY_CARE_PROVIDER_SITE_OTHER): Payer: Medicare Other | Admitting: Interventional Cardiology

## 2017-07-07 ENCOUNTER — Encounter: Payer: Self-pay | Admitting: Interventional Cardiology

## 2017-07-07 VITALS — BP 110/54 | HR 84 | Ht 61.0 in | Wt 215.0 lb

## 2017-07-07 DIAGNOSIS — I255 Ischemic cardiomyopathy: Secondary | ICD-10-CM

## 2017-07-07 DIAGNOSIS — I2510 Atherosclerotic heart disease of native coronary artery without angina pectoris: Secondary | ICD-10-CM | POA: Diagnosis not present

## 2017-07-07 DIAGNOSIS — G4733 Obstructive sleep apnea (adult) (pediatric): Secondary | ICD-10-CM | POA: Diagnosis not present

## 2017-07-07 DIAGNOSIS — R0602 Shortness of breath: Secondary | ICD-10-CM | POA: Diagnosis not present

## 2017-07-07 DIAGNOSIS — I5042 Chronic combined systolic (congestive) and diastolic (congestive) heart failure: Secondary | ICD-10-CM | POA: Diagnosis not present

## 2017-07-07 DIAGNOSIS — Z9989 Dependence on other enabling machines and devices: Secondary | ICD-10-CM | POA: Diagnosis not present

## 2017-07-07 DIAGNOSIS — I10 Essential (primary) hypertension: Secondary | ICD-10-CM

## 2017-07-07 LAB — CBC
Hematocrit: 38.1 % (ref 34.0–46.6)
Hemoglobin: 12.6 g/dL (ref 11.1–15.9)
MCH: 30.9 pg (ref 26.6–33.0)
MCHC: 33.1 g/dL (ref 31.5–35.7)
MCV: 93 fL (ref 79–97)
Platelets: 244 10*3/uL (ref 150–379)
RBC: 4.08 x10E6/uL (ref 3.77–5.28)
RDW: 14.4 % (ref 12.3–15.4)
WBC: 6.8 10*3/uL (ref 3.4–10.8)

## 2017-07-07 LAB — BASIC METABOLIC PANEL
BUN/Creatinine Ratio: 13 (ref 12–28)
BUN: 17 mg/dL (ref 8–27)
CO2: 21 mmol/L (ref 20–29)
Calcium: 10 mg/dL (ref 8.7–10.3)
Chloride: 100 mmol/L (ref 96–106)
Creatinine, Ser: 1.27 mg/dL — ABNORMAL HIGH (ref 0.57–1.00)
GFR calc Af Amer: 52 mL/min/{1.73_m2} — ABNORMAL LOW (ref >59)
GFR calc non Af Amer: 45 mL/min/{1.73_m2} — ABNORMAL LOW (ref >59)
Glucose: 212 mg/dL — ABNORMAL HIGH (ref 65–99)
Potassium: 4.1 mmol/L (ref 3.5–5.2)
Sodium: 140 mmol/L (ref 134–144)

## 2017-07-07 LAB — PRO B NATRIURETIC PEPTIDE: NT-PRO BNP: 298 pg/mL — AB (ref 0–287)

## 2017-07-07 NOTE — Progress Notes (Signed)
[facility] Summary Report / Loop Recorder 

## 2017-07-07 NOTE — Patient Instructions (Signed)
Medication Instructions:  Your physician recommends that you continue on your current medications as directed. Please refer to the Current Medication list given to you today.  Labwork: Today for complete metabolic panel, BNP and complete blood count.   Testing/Procedures: None ordered   Follow-Up: Your physician wants you to follow-up in: 6 months with Dr. Tamala Julian. You will receive a reminder letter in the mail two months in advance. If you don't receive a letter, please call our office to schedule the follow-up appointment.  Any Other Special Instructions Will Be Listed Below (If Applicable).     If you need a refill on your cardiac medications before your next appointment, please call your pharmacy.

## 2017-07-07 NOTE — Progress Notes (Signed)
Cardiology Office Note    Date:  07/07/2017   ID:  Shabnam, Ladd 09-25-1950, MRN [id number]  PCP:  Marton Redwood, MD  Cardiologist: Sinclair Grooms, MD   Chief Complaint  Patient presents with  . Congestive Heart Failure    History of Present Illness:  Danielle Vargas is a 66 y.o. female who has a hx of CAD, ischemic cardiomyopathy, OSA, prior CVA, IDDM, HTN, & HLD. 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 This was treated with angioplasty. EF was 35-40% at the time. Follow-up echo 5/15 demonstrated improved LV function with an EF of 55%. Represented this March with acute occlusion of the circumflex treated with angioplasty. There was acute decrease in LV systolic function to less than 25%.  Most recent LVEF is 65% June 2018.  No chest pain.  Daughter is concerned about the presence of intermittent shortness of breath.  No nitroglycerin use.  Denies orthopnea.  No episodes of syncope or palpitation.   Past Medical History:  Diagnosis Date  . Acute combined systolic and diastolic heart failure ([place]) 11/19/2016  . Allergy   . Asthma   . CAD (coronary artery disease)    a. s/p lateral STEMI in 2015 2ry to occlusion of OM1 thought to be caused by coronary artery dissection, treated with POBA. b. 11/2016: STEMI with thrombotic occlusion of distal LCx which was treated with balloon angioplasty.   . Cataract    "just beginning"  . Colon polyps 7/07   hyperplastic and adenomatous  . Depression    pt unsure, psychologist said no  . Diverticulosis   . DM (diabetes mellitus) ([place])   . Dyslipidemia   . External hemorrhoid   . Fatty liver   . Gastroparesis   . HTN (hypertension)   . Hyperlipidemia   . 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 Hypothyroidism   . Migraine   . Myocardial infarction ([place])    FEb 2015  . Nephrolithiasis   . Obesity   .  Osteoporosis   . Sleep apnea    wears CPAP  . ST elevation myocardial infarction (STEMI) of inferolateral wall ([place]) 11/19/2016  . Stroke [facility])     Past Surgical History:  Procedure Laterality Date  . BREAST REDUCTION SURGERY  '88  . CARPAL TUNNEL RELEASE  2000   right wrist  . CERVICAL DISC SURGERY  '98   fusion  . CESAREAN SECTION  '79  '84  . COLONOSCOPY    . CORONARY BALLOON ANGIOPLASTY N/A 11/19/2016   Procedure: Coronary Balloon Angioplasty;  Surgeon: Sherren Mocha, MD;  Location: [place] CV LAB;  Service: Cardiovascular;  Laterality: N/A;  . EP IMPLANTABLE DEVICE N/A 03/12/2016   Procedure: Loop Recorder Insertion;  Surgeon: Will Meredith Leeds, MD;  Location: [place] CV LAB;  Service: Cardiovascular;  Laterality: N/A;  . LEFT HEART CATH AND CORONARY ANGIOGRAPHY N/A 11/19/2016   Procedure: Left Heart Cath and Coronary Angiography;  Surgeon: Sherren Mocha, MD;  Location: [place] CV LAB;  Service: Cardiovascular;  Laterality: N/A;  . LEFT HEART CATHETERIZATION WITH CORONARY ANGIOGRAM N/A 10/26/2013   Procedure: LEFT HEART CATHETERIZATION WITH CORONARY ANGIOGRAM;  Surgeon: Sinclair Grooms, MD;  Location: [facility] CATH LAB;  Service: Cardiovascular;  Laterality: N/A;  . ROTATOR CUFF REPAIR  '09   left shoulder  . TEE WITHOUT CARDIOVERSION N/A 03/12/2016   Procedure:  TRANSESOPHAGEAL ECHOCARDIOGRAM (TEE);  Surgeon: Sanda Klein, MD;  Location: [facility] ENDOSCOPY;  Service: Cardiovascular;  Laterality: N/A;  . TUBAL LIGATION  '85  . ULNAR NERVE REPAIR  '02, '08   left done, then right    Current Medications: Outpatient Medications Prior to Visit  Medication Sig Dispense Refill  . acetaminophen (TYLENOL) 325 MG tablet Take 650 mg by mouth every 6 (six) hours as needed (pain).    Marland Kitchen albuterol (PROVENTIL HFA;VENTOLIN HFA) 108 (90 BASE) MCG/ACT inhaler Inhale 2 puffs into the lungs every 6 (six) hours as needed for wheezing or shortness of breath.     Marland Kitchen aspirin EC 81 MG EC tablet Take  1 tablet (81 mg total) by mouth daily.    . carvedilol (COREG) 3.125 MG tablet Take 1 tablet by mouth 2 (two) times daily.    . fenofibrate 160 MG tablet Take 160 mg by mouth at bedtime.     . gabapentin (NEURONTIN) 300 MG capsule Take 300 mg by mouth 3 (three) times daily.    . insulin glargine (LANTUS) 100 UNIT/ML injection Inject 50 units in am and 45 units at bedtime. 20 mL 11  . isosorbide mononitrate (IMDUR) 30 MG 24 hr tablet Take 30 mg by mouth daily.    Marland Kitchen levothyroxine (SYNTHROID, LEVOTHROID) 100 MCG tablet Take 1 tablet (100 mcg total) by mouth daily before breakfast.    . metoCLOPramide (REGLAN) 10 MG tablet Take 10 mg by mouth 4 (four) times daily.     . mometasone (NASONEX) 50 MCG/ACT nasal spray Place 2 sprays into the nose daily. 17 g 12  . montelukast (SINGULAIR) 10 MG tablet Take 10 mg by mouth at bedtime.    . Multiple Vitamin (MULTIVITAMIN WITH MINERALS) TABS tablet Take 1 tablet by mouth daily. One A Day 50+    . nitroGLYCERIN (NITROSTAT) 0.4 MG SL tablet Place 1 tablet (0.4 mg total) under the tongue every 5 (five) minutes as needed for chest pain. 25 tablet 3  . OXYGEN Inhale 1 L into the lungs continuous.    . pantoprazole (PROTONIX) 40 MG tablet Take 1 tablet (40 mg total) by mouth daily.    . polyethylene glycol (MIRALAX / GLYCOLAX) packet Take 17 g by mouth every other day.     . terbinafine (LAMISIL) 250 MG tablet Take 250 mg by mouth daily. STOP 07-12-17    . ticagrelor (BRILINTA) 90 MG TABS tablet Take 1 tablet (90 mg total) by mouth 2 (two) times daily. 60 tablet   . tiZANidine (ZANAFLEX) 2 MG tablet Take 2 mg by mouth 3 (three) times daily.     Marland Kitchen topiramate (TOPAMAX) 50 MG tablet Take 50 mg by mouth 2 (two) times daily.    Marland Kitchen FLUoxetine (PROZAC) 10 MG capsule Take 1 capsule (10 mg total) by mouth daily. (Patient not taking: Reported on 07/07/2017)  3  . furosemide (LASIX) 20 MG tablet Take 1 tablet (20 mg total) by mouth daily as needed (if weight >200 lb). (Patient  not taking: Reported on 07/07/2017) 30 tablet   . insulin regular (NOVOLIN R RELION) 100 units/mL injection Inject 50 units before b'fast, 55 units before lunch, and 45 units before dinner (Patient not taking: Reported on 07/07/2017) 30 mL 11  . Menthol-Methyl Salicylate (MUSCLE RUB) 10-15 % CREA Apply 1 application topically 2 (two) times daily as needed for muscle pain. For right hip pain (Patient not taking: Reported on 07/07/2017)  0  . mometasone-formoterol (DULERA) 200-5 MCG/ACT AERO Inhale 2 puffs  into the lungs 2 (two) times daily. (Patient not taking: Reported on 07/07/2017)    . rosuvastatin (CRESTOR) 20 MG tablet Take 1 tablet (20 mg total) by mouth daily. (Patient not taking: Reported on 07/07/2017)     Facility-Administered Medications Prior to Visit  Medication Dose Route Frequency Provider Last Rate Last Dose  . mupirocin cream (BACTROBAN) 2 %   Topical BID Edrick Kins, DPM         Allergies:   Invokana [canagliflozin]; Nsaids; and Penicillins   Social History   Social History  . Marital status: Divorced    Spouse name: N/A  . Number of children: 2  . Years of education: N/A   Occupational History  . disabled Unemployed   Social History Main Topics  . Smoking status: Never Smoker  . Smokeless tobacco: Never Used  . Alcohol use No  . Drug use: No  . Sexual activity: Not Currently    Birth control/ protection: Post-menopausal   Other Topics Concern  . None   Social History Narrative   Lives in [place] alone.  Retired.  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   Regular exercise: none   Caffeine use: daily; dt coke              Family History:  The patient's family history includes COPD in her father; Cirrhosis in her sister; Colon cancer (age of onset: 59) in her unknown relative; Colon cancer (age of onset: 68) in her paternal aunt; Diabetes in her brother and mother; Emphysema in her father; Heart attack in her brother, mother, and paternal  grandfather; Heart failure in her brother and mother; Hypertension in her brother; Kidney disease in her mother; Liver disease in her sister; Lung cancer in her brother; Other in her unknown relative; Stroke in her father.   ROS:   Please see the history of present illness.    Shortness of breath with activity.  No medication side effects. All other systems reviewed and are negative.   PHYSICAL EXAM:   VS:  BP (!) 110/54 (BP Location: Right Arm)   Pulse 84   Ht 5\' 1"  (1.549 m)   Wt 215 lb (97.5 kg)   BMI 40.62 kg/m    GEN: Well nourished, well developed, in no acute distress.  Morbidly obese HEENT: normal  Neck: no JVD, carotid bruits, or masses Cardiac: RRR; no murmurs, rubs, or gallops,no edema  Respiratory:  clear to auscultation bilaterally, normal work of breathing GI: soft, nontender, nondistended, + BS MS: no deformity or atrophy  Skin: warm and dry, no rash Neuro:  Alert and Oriented x 3, Strength and sensation are intact Psych: euthymic mood, full affect  Wt Readings from Last 3 Encounters:  07/07/17 215 lb (97.5 kg)  03/26/17 203 lb (92.1 kg)  03/05/17 199 lb (90.3 kg)      Studies/Labs Reviewed:   EKG:  EKG not repeated  Recent Labs: 11/28/2016: ALT 22; B Natriuretic Peptide 797.4; Magnesium 2.0 12/03/2016: BUN 20; Creatinine, Ser 1.09; Hemoglobin 12.0; NT-Pro BNP 7,671; Platelets 308; Potassium 4.3; Sodium 139; TSH 2.020   Lipid Panel    Component Value Date/Time   CHOL 117 11/20/2016 0331   TRIG 225 (H) 11/20/2016 0331   HDL 23 (L) 11/20/2016 0331   CHOLHDL 5.1 11/20/2016 0331   VLDL 45 (H) 11/20/2016 0331   LDLCALC 49 11/20/2016 0331    Additional studies/ records that were reviewed today include:  2D Doppler echocardiogram performed 03/05/2017:  Study Conclusions   - Left ventricle: The cavity size was normal. There was moderate   concentric hypertrophy. Systolic function was vigorous. The   estimated ejection fraction was in the range of 65% to  70%. Wall   motion was normal; there were no regional wall motion   abnormalities. Doppler parameters are consistent with abnormal   left ventricular relaxation (grade 1 diastolic dysfunction).   Doppler parameters are consistent with elevated ventricular   end-diastolic filling pressure. - Aortic valve: There was no regurgitation. - Mitral valve: There was no regurgitation. - Right ventricle: Systolic function was normal. - Right atrium: The atrium was normal in size. - Tricuspid valve: There was no regurgitation. - Inferior vena cava: The vessel was normal in size. - Pericardium, extracardiac: There was no pericardial effusion.   Impressions:   - When compared to the prior study from 11/20/2016 LVEF has improved   from 20-25% to 65-70%, wall motion abnormalities are no longer   present.   ASSESSMENT:    1. Chronic combined systolic and diastolic heart failure ([place])   2. CAD in native artery   3. Essential hypertension   4. OSA on CPAP      PLAN:  In order of problems listed above:  1. The daughter states there shortness of breath.  BNP will be checked.  We will also do a CBC and basic metabolic panel.  LVEF improved to 65% compared to March 2018. 2. Dual antiplatelet therapy will be continued until March which time he will be discontinued.  She is status post angioplasty of the distal circumflex.   3. Relatively low blood pressure no change in current therapy. 4. Continue CPAP and oxygen therapy.  Clinical follow-up in 6 months.  Discontinue Brilinta on return visit.  BMP is being checked today to determine if there is a significant cardiac component related to her current dyspnea complaint.  Medication Adjustments/Labs and Tests Ordered: Current medicines are reviewed at length with the patient today.  Concerns regarding medicines are outlined above.  Medication changes, Labs and Tests ordered today are listed in the Patient Instructions below. There are no Patient  Instructions on file for this visit.   Signed, Sinclair Grooms, MD  07/07/2017 11:36 AM    [facility] [place], [place], [place]  [id number] Phone: [phone]; Fax: [phone]

## 2017-07-10 LAB — CUP PACEART REMOTE DEVICE CHECK
Date Time Interrogation Session: 20181029021025
MDC IDC PG IMPLANT DT: 20170705

## 2017-07-16 ENCOUNTER — Encounter: Payer: Self-pay | Admitting: Cardiology

## 2017-08-04 ENCOUNTER — Ambulatory Visit: Payer: Medicare Other | Admitting: Physical Medicine & Rehabilitation

## 2017-08-04 ENCOUNTER — Ambulatory Visit (INDEPENDENT_AMBULATORY_CARE_PROVIDER_SITE_OTHER): Payer: Medicare Other | Admitting: *Deleted

## 2017-08-04 DIAGNOSIS — I639 Cerebral infarction, unspecified: Secondary | ICD-10-CM | POA: Diagnosis not present

## 2017-08-05 NOTE — Progress Notes (Signed)
[facility] Summary Report / Loop Recorder 

## 2017-08-14 ENCOUNTER — Ambulatory Visit (HOSPITAL_BASED_OUTPATIENT_CLINIC_OR_DEPARTMENT_OTHER): Payer: Medicare Other | Admitting: Physical Medicine & Rehabilitation

## 2017-08-14 ENCOUNTER — Encounter: Payer: Medicare Other | Attending: Physical Medicine & Rehabilitation

## 2017-08-14 ENCOUNTER — Encounter: Payer: Self-pay | Admitting: Physical Medicine & Rehabilitation

## 2017-08-14 VITALS — BP 107/67 | HR 75

## 2017-08-14 DIAGNOSIS — E119 Type 2 diabetes mellitus without complications: Secondary | ICD-10-CM | POA: Diagnosis not present

## 2017-08-14 DIAGNOSIS — E669 Obesity, unspecified: Secondary | ICD-10-CM | POA: Diagnosis not present

## 2017-08-14 DIAGNOSIS — I69354 Hemiplegia and hemiparesis following cerebral infarction affecting left non-dominant side: Secondary | ICD-10-CM | POA: Insufficient documentation

## 2017-08-14 DIAGNOSIS — G8114 Spastic hemiplegia affecting left nondominant side: Secondary | ICD-10-CM

## 2017-08-14 DIAGNOSIS — G8929 Other chronic pain: Secondary | ICD-10-CM | POA: Diagnosis present

## 2017-08-14 DIAGNOSIS — G8110 Spastic hemiplegia affecting unspecified side: Secondary | ICD-10-CM

## 2017-08-14 DIAGNOSIS — E785 Hyperlipidemia, unspecified: Secondary | ICD-10-CM | POA: Insufficient documentation

## 2017-08-14 DIAGNOSIS — Z5189 Encounter for other specified aftercare: Secondary | ICD-10-CM | POA: Diagnosis present

## 2017-08-14 DIAGNOSIS — I10 Essential (primary) hypertension: Secondary | ICD-10-CM | POA: Diagnosis not present

## 2017-08-14 NOTE — Progress Notes (Signed)
Botox Injection for spasticity using needle EMG guidance  Dilution: 50 Units/ml Indication: Severe spasticity which interferes with ADL,mobility and/or  hygiene and is unresponsive to medication management and other conservative care Informed consent was obtained after describing risks and benefits of the procedure with the patient. This includes bleeding, bruising, infection, excessive weakness, or medication side effects. A REMS form is on file and signed. Needle: 27g 1" needle electrode Number of units per muscle Biceps50 Brachiorad 50 FCR25 FCU25 FDS25 FDP25,  pronator teres 50 Pronator quadratus, 50  Based on EMG activity may be able to reduce L biceps Left FDP All injections were done after obtaining appropriate EMG activity and after negative drawback for blood. The patient tolerated the procedure well. Post procedure instructions were given. A followup appointment was made.

## 2017-08-14 NOTE — Patient Instructions (Signed)

## 2017-08-20 LAB — CUP PACEART REMOTE DEVICE CHECK
Date Time Interrogation Session: 20181128031042
Implantable Pulse Generator Implant Date: 20170705

## 2017-08-27 ENCOUNTER — Ambulatory Visit (INDEPENDENT_AMBULATORY_CARE_PROVIDER_SITE_OTHER): Payer: Medicare Other | Admitting: Ophthalmology

## 2017-08-27 DIAGNOSIS — E11319 Type 2 diabetes mellitus with unspecified diabetic retinopathy without macular edema: Secondary | ICD-10-CM

## 2017-08-27 DIAGNOSIS — H43813 Vitreous degeneration, bilateral: Secondary | ICD-10-CM | POA: Diagnosis not present

## 2017-08-27 DIAGNOSIS — H35033 Hypertensive retinopathy, bilateral: Secondary | ICD-10-CM

## 2017-08-27 DIAGNOSIS — E113393 Type 2 diabetes mellitus with moderate nonproliferative diabetic retinopathy without macular edema, bilateral: Secondary | ICD-10-CM

## 2017-08-27 DIAGNOSIS — I10 Essential (primary) hypertension: Secondary | ICD-10-CM | POA: Diagnosis not present

## 2017-08-27 DIAGNOSIS — H2513 Age-related nuclear cataract, bilateral: Secondary | ICD-10-CM | POA: Diagnosis not present

## 2017-09-03 ENCOUNTER — Ambulatory Visit (INDEPENDENT_AMBULATORY_CARE_PROVIDER_SITE_OTHER): Payer: Medicare Other | Admitting: *Deleted

## 2017-09-03 DIAGNOSIS — I639 Cerebral infarction, unspecified: Secondary | ICD-10-CM

## 2017-09-04 NOTE — Progress Notes (Signed)
[facility] Summary Report / Loop Recorder 

## 2017-09-14 LAB — CUP PACEART REMOTE DEVICE CHECK
Implantable Pulse Generator Implant Date: 20170705
MDC IDC SESS DTM: 20181228090850

## 2017-09-23 ENCOUNTER — Ambulatory Visit: Payer: Medicare Other | Admitting: Internal Medicine

## 2017-09-23 DIAGNOSIS — Z0289 Encounter for other administrative examinations: Secondary | ICD-10-CM

## 2017-09-23 NOTE — Progress Notes (Deleted)
Patient ID: Danielle Vargas, female   DOB: 10/05/1950, 67 y.o.   MRN: [id number]  HPI: Danielle Vargas is a 67 y.o.-year-old woman, returning for f/u for of DM - LADA dx 1999, insulin-dependent since 10/2012, uncontrolled, with complications (CAD- s/p STEMI 10/2013, STEMI 2018, cerebro-vascular ds. 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 Last visit 3 months ago.  She has [name].  She continues to reside in the SNF after her stroke.  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  She is on: - Lantus 50 units in am and 45 units at bedtime - Novolin R:  Before b'fast: 50 units  Before lunch: 55 units  Before dinner: 45 units If sugars before a meal 70-90, then only give Novolog 20 units before that meal. If sugars before a meal <70, then skip Novolog before that meal. She has been on Glipizide in the past, too. She tried Invokana in the past >> severe diarrhea Also tried U500 >> could not afford this.  Patient has her sugars checked 4 times a day at the facility: - am: 172-248, 383 >> 102, 129-228 in last week, prev: 173-374 - 2h after b'fast: 115-205, 239 >> n/c - before lunch: 275-354, 404 >> 91-249, 292 in last week, before: 201-335 - 2h after lunch: 83, 126-199 >> 57x1, 99-247 >> n/c - before dinner:  178-302, 384 >> 199-326 in last week, prev. 222-359 - 2h after dinner:  111-214 >> 109-273, 339 >> n/c - bedtime: 135-227, 283 >> 231-364 >> 208-348 - nighttime: 75-232 >> 136-291 >> n/c Lowest 172 >> 91 x1 >> ***; she has hypoglycemia awareness in the 90s.   Highest sugar was 434 x1 >> ***.  -+ Mild CKD, last  BUN/creatinine:  Lab Results  Component Value Date   BUN 17 07/07/2017   CREATININE 1.27 (H) 07/07/2017  On losartan. -+ HL:  Lab Results  Component Value Date   CHOL 117 11/20/2016   HDL 23 (L) 11/20/2016   LDLCALC 49 11/20/2016   TRIG 225 (H) 11/20/2016   CHOLHDL 5.1 11/20/2016  02/2015: TG 252 (PCP). She is on Crestor and fenofibrate. - last eye exam was in 02/2017: + R>L DR (unchanged) - Dr Zigmund Daniel.  - + numbness and tingling in her feet. Continues Neurontin.  She also has a history of HTN, fatty liver, GERD, morbid obesity, hypothyroidism, migraines, chronic back pain, history of nephrolithiasis, osteoporosis, diverticulosis.  She has OSA >> on CPAP.  She also had pancreatitis (03/21/2015).  She has been admitted for CP 11/13/2015 >> r/o for AMI. She saw Dr Sallyanne Kuster in the hospital, her cardiologist is Dr. Tamala Julian. 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 She is still in a wheelchair. Continues to live in [facility].  She also has hypothyroidism  -normal at last check: Lab Results  Component Value Date   TSH 2.020 12/03/2016   ROS: Constitutional: no weight gain/no weight loss, no fatigue, no subjective hyperthermia, no subjective hypothermia Eyes: no blurry vision, no xerophthalmia ENT: no sore throat, no nodules palpated in throat, no dysphagia, no odynophagia, no hoarseness Cardiovascular: no CP/no SOB/no palpitations/no leg swelling Respiratory: no cough/no SOB/no wheezing Gastrointestinal: no N/no V/no D/no  C/no acid reflux Musculoskeletal: no muscle aches/no joint aches Skin: no rashes, no hair loss Neurological: no tremors/+ numbness/+ tingling/no dizziness  I reviewed pt's medications, allergies, PMH, social hx, family hx, and changes were documented in the history of present illness. Otherwise, unchanged from my initial visit note.  PE: There were no vitals taken for this visit. There is no height or weight on file to calculate BMI. Wt Readings from Last 3  Encounters:  07/07/17 215 lb (97.5 kg)  03/26/17 203 lb (92.1 kg)  03/05/17 199 lb (90.3 kg)   Constitutional: Obese, in NAD, in wheelchair Eyes: PERRLA, EOMI, no exophthalmos ENT: moist mucous membranes, no thyromegaly, no cervical lymphadenopathy Cardiovascular: RRR, No MRG Respiratory: CTA B Gastrointestinal: abdomen soft, NT, ND, BS+ Musculoskeletal: no deformities,  +L hemiparesis Skin: moist, warm, no rashes Neurological: no tremor with outstretched hands, DTR normal in all 4  ASSESSMENT: 1. DM2, insulin-dependent, uncontrolled, with complications - CAD - STEMI 10/26/2013 - had angioplasty, no stent placed. Had 2DEcho on 01/23/2014 EF increased (from 35) to 55%.             - STEMI 11/19/2016. - cerebro-vascular ds. 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 Obesity  PLAN:  1. Patient with long-standing, uncontrolled, diabetes, with slightly better control at last visit, especially for a.m. sugars and before lunch.  Still high, so at last visit we increased her mealtime R insulin. - She is in a wheelchair after her stroke with very limited activity, which contributes to her insulin resistance.  Since she is in a SNF, we cannot use U500 insulin, for which she would qualify based on the high doses of insulin that she is using now.    -  I suggested:  Patient Instructions  Please continue: - Lantus 50 units in am and 45 units at bedtime - Novolin R:  Before b'fast: 50 units  Before lunch: 55 units  Before dinner: 45 units If sugars before a meal 70-90, then only give Novolog 20 units before that meal. If sugars before a meal <70, then skip Novolog before that meal.  Please return in 3 months with your sugar log.   - today, HbA1c is 7%  - continue checking sugars at different times of the day - check 4x a day, rotating checks - advised for yearly eye  exams >> she is UTD - Return to clinic in 3 mo with sugar log    2. Obesity - class 3 - We are limited by the fact that she is in an assisted living facility and her meals are standard.  We discussed about reducing snacks.  Unfortunately, we cannot use GLP-1 receptor agonists (gastroparesis, pancreatitis) or SGLT2 inhibitors (severe diarrhea after Invokana), which would help with both diabetes and weight loss  Philemon Kingdom, MD PhD [facility] Endocrinology

## 2017-09-25 ENCOUNTER — Ambulatory Visit (HOSPITAL_BASED_OUTPATIENT_CLINIC_OR_DEPARTMENT_OTHER): Payer: Medicare Other | Admitting: Physical Medicine & Rehabilitation

## 2017-09-25 ENCOUNTER — Encounter: Payer: Medicare Other | Attending: Physical Medicine & Rehabilitation

## 2017-09-25 ENCOUNTER — Other Ambulatory Visit: Payer: Self-pay

## 2017-09-25 ENCOUNTER — Encounter: Payer: Self-pay | Admitting: Physical Medicine & Rehabilitation

## 2017-09-25 VITALS — BP 100/67 | HR 70

## 2017-09-25 DIAGNOSIS — I10 Essential (primary) hypertension: Secondary | ICD-10-CM | POA: Insufficient documentation

## 2017-09-25 DIAGNOSIS — E785 Hyperlipidemia, unspecified: Secondary | ICD-10-CM | POA: Insufficient documentation

## 2017-09-25 DIAGNOSIS — I69354 Hemiplegia and hemiparesis following cerebral infarction affecting left non-dominant side: Secondary | ICD-10-CM | POA: Diagnosis present

## 2017-09-25 DIAGNOSIS — G8929 Other chronic pain: Secondary | ICD-10-CM | POA: Insufficient documentation

## 2017-09-25 DIAGNOSIS — G8110 Spastic hemiplegia affecting unspecified side: Secondary | ICD-10-CM | POA: Diagnosis not present

## 2017-09-25 DIAGNOSIS — E669 Obesity, unspecified: Secondary | ICD-10-CM | POA: Insufficient documentation

## 2017-09-25 DIAGNOSIS — Z5189 Encounter for other specified aftercare: Secondary | ICD-10-CM | POA: Diagnosis present

## 2017-09-25 DIAGNOSIS — E119 Type 2 diabetes mellitus without complications: Secondary | ICD-10-CM | POA: Insufficient documentation

## 2017-09-25 NOTE — Progress Notes (Signed)
Subjective:    Patient ID: Danielle Vargas, female    DOB: 1951/06/26, 66 y.o.   MRN: [id number]  HPI  67 year old female who resides in a skilled nursing facility returns today for follow-up from a Botox injection.  She has a history of left spastic hemiplegia secondary to right CVA.Botox Injection for spasticity performed 08/14/2017, following muscle groups and dosing  Biceps50 Brachiorad 50 FCR25 FCU25 FDS25 FDP25,  pronator teres 50 Pronator quadratus, 50  Pain Inventory Average Pain 0 Pain Right Now 0 My pain is no pain  In the last 24 hours, has pain interfered with the following? General activity 0 Relation with others 0 Enjoyment of life 0 What TIME of day is your pain at its worst? no pain Sleep (in general) Good  Pain is worse with: no pain Pain improves with: no pain Relief from Meds: 0  Mobility ability to climb steps?  no do you drive?  no use a wheelchair  Function retired I need assistance with the following:  dressing, bathing, toileting, meal prep, household duties and shopping  Neuro/Psych bladder control problems bowel control problems  Prior Studies Any changes since last visit?  no  Physicians involved in your care Any changes since last visit?  no   Family History  Problem Relation Age of Onset  . COPD Father   . Stroke Father   . Emphysema Father   . Heart failure Mother   . Heart attack Mother        several  . Diabetes Mother   . Kidney disease Mother   . Heart failure Brother   . Hypertension Brother   . Colon cancer Unknown 45       mat. 1st cousin  . Other Unknown        celiac sprue, 1/2 brother  . Diabetes Brother        x 3  . Lung cancer Brother   . Cirrhosis Sister        liver transplant  . Liver disease Sister        transplant  . Heart attack Paternal Grandfather   . Heart attack Brother   . Colon cancer Paternal Aunt 9  . Esophageal cancer Neg Hx   . Stomach cancer Neg Hx   . Rectal cancer Neg Hx      Social History   Socioeconomic History  . Marital status: Divorced    Spouse name: None  . Number of children: 2  . Years of education: None  . Highest education level: None  Social Needs  . Financial resource strain: None  . Food insecurity - worry: None  . Food insecurity - inability: None  . Transportation needs - medical: None  . Transportation needs - non-medical: None  Occupational History  . Occupation: disabled    Fish farm manager: UNEMPLOYED  Tobacco Use  . Smoking status: Never Smoker  . Smokeless tobacco: Never Used  Substance and Sexual Activity  . Alcohol use: No  . Drug use: No  . Sexual activity: Not Currently    Birth control/protection: Post-menopausal  Other Topics Concern  . None  Social History Narrative   Lives in [place] alone.  Retired.  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   Regular exercise: none   Caffeine use: daily; dt coke         Past Surgical History:  Procedure Laterality Date  . BREAST REDUCTION SURGERY  '88  . CARPAL TUNNEL RELEASE  2000   right  wrist  . CERVICAL DISC SURGERY  '98   fusion  . CESAREAN SECTION  '79  '84  . COLONOSCOPY    . CORONARY BALLOON ANGIOPLASTY N/A 11/19/2016   Procedure: Coronary Balloon Angioplasty;  Surgeon: Sherren Mocha, MD;  Location: [facility];  Service: Cardiovascular;  Laterality: N/A;  . EP IMPLANTABLE DEVICE N/A 03/12/2016   Procedure: Loop Recorder Insertion;  Surgeon: Will Meredith Leeds, MD;  Location: [place] [facility];  Service: Cardiovascular;  Laterality: N/A;  . LEFT HEART CATH AND CORONARY ANGIOGRAPHY N/A 11/19/2016   Procedure: Left Heart Cath and Coronary Angiography;  Surgeon: Sherren Mocha, MD;  Location: [place] [facility];  Service: Cardiovascular;  Laterality: N/A;  . LEFT HEART CATHETERIZATION WITH CORONARY ANGIOGRAM N/A 10/26/2013   Procedure: LEFT HEART CATHETERIZATION WITH CORONARY ANGIOGRAM;  Surgeon: Sinclair Grooms, MD;  Location: [facility] CATH LAB;  Service:  Cardiovascular;  Laterality: N/A;  . ROTATOR CUFF REPAIR  '09   left shoulder  . TEE WITHOUT CARDIOVERSION N/A 03/12/2016   Procedure: TRANSESOPHAGEAL ECHOCARDIOGRAM (TEE);  Surgeon: Sanda Klein, MD;  Location: [facility] ENDOSCOPY;  Service: Cardiovascular;  Laterality: N/A;  . TUBAL LIGATION  '85  . ULNAR NERVE REPAIR  '02, '08   left done, then right   Past Medical History:  Diagnosis Date  . Acute combined systolic and diastolic heart failure ([place]) 11/19/2016  . Allergy   . Asthma   . CAD (coronary artery disease)    a. s/p lateral STEMI in 2015 2ry to occlusion of OM1 thought to be caused by coronary artery dissection, treated with POBA. b. 11/2016: STEMI with thrombotic occlusion of distal LCx which was treated with balloon angioplasty.   . Cataract    "just beginning"  . Colon polyps 7/07   hyperplastic and adenomatous  . Depression    pt unsure, psychologist said no  . Diverticulosis   . DM (diabetes mellitus) ([place])   . Dyslipidemia   . External hemorrhoid   . Fatty liver   . Gastroparesis   . HTN (hypertension)   . Hyperlipidemia   . 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 Hypothyroidism   . Migraine   . Myocardial infarction ([place])    FEb 2015  . Nephrolithiasis   . Obesity   . Osteoporosis   . Sleep apnea    wears CPAP  . ST elevation myocardial infarction (STEMI) of inferolateral wall ([place]) 11/19/2016  . Stroke [facility])    There were no vitals taken for this visit.  Opioid Risk Score:   Fall Risk Score:  `1  Depression screen PHQ 2/9  Depression screen [facility] 2/9 09/25/2017 10/09/2016 07/15/2016 09/21/2015 09/18/2014 12/26/2013  Decreased Interest 0 0 0 0 1 0  Down, Depressed, Hopeless 0 0 0 0 1 1  PHQ - 2 Score 0 0 0 0 2 1  Some recent data might be hidden      Review of Systems  Constitutional: Negative.   HENT: Negative.   Eyes: Negative.   Respiratory: Negative.   Cardiovascular: Negative.   Gastrointestinal: Negative.   Endocrine: Negative.     Genitourinary: Negative.   Musculoskeletal: Negative.   Skin: Negative.   Allergic/Immunologic: Negative.   Neurological: Negative.   Hematological: Negative.   Psychiatric/Behavioral: Negative.        Objective:   Physical Exam  Constitutional: She is oriented to person, place, and time. She appears well-developed and well-nourished. No distress.  HENT:  Head: Normocephalic and atraumatic.  Eyes: Conjunctivae  and EOM are normal. Pupils are equal, round, and reactive to light.  Neck: Normal range of motion.  Neurological: She is alert and oriented to person, place, and time. Gait abnormal.  Nonambulatory  Skin: She is not diaphoretic.  Psychiatric: She has a normal mood and affect.  Nursing note and vitals reviewed.  General no acute distress Tone MAS 0 at the elbow flexors MAS 0 at the wrist flexors MAS 0 at the finger flexors Motor strength is 0/5 in left deltoid bicep tricep grip  3- at the left hip knee extensor synergy       Assessment & Plan:  1.  Right CVA causing left spastic hemiplegia upper extremity greater than lower extremity.  Improved after botulinum toxin injection.  Based on her clinical response as well as EMG activity during the injection would recommend the following doses  Biceps25 Brachiorad 50 FCR25 FCU25 FDS25 FDP0,  pronator teres 50 Pronator quadratus, 50

## 2017-10-05 ENCOUNTER — Ambulatory Visit (INDEPENDENT_AMBULATORY_CARE_PROVIDER_SITE_OTHER): Payer: Medicare Other | Admitting: *Deleted

## 2017-10-05 DIAGNOSIS — I639 Cerebral infarction, unspecified: Secondary | ICD-10-CM | POA: Diagnosis not present

## 2017-10-05 NOTE — Progress Notes (Signed)
[facility] Summary Report / Loop Recorder 

## 2017-10-15 LAB — CUP PACEART REMOTE DEVICE CHECK
Implantable Pulse Generator Implant Date: 20170705
MDC IDC SESS DTM: 20190127130930

## 2017-11-06 ENCOUNTER — Ambulatory Visit (INDEPENDENT_AMBULATORY_CARE_PROVIDER_SITE_OTHER): Payer: Medicare Other | Admitting: *Deleted

## 2017-11-06 DIAGNOSIS — I639 Cerebral infarction, unspecified: Secondary | ICD-10-CM

## 2017-11-06 NOTE — Progress Notes (Signed)
[facility] Summary Report / Loop Recorder 

## 2017-11-13 ENCOUNTER — Ambulatory Visit: Payer: Medicare Other | Admitting: Physical Medicine & Rehabilitation

## 2017-11-13 ENCOUNTER — Ambulatory Visit: Payer: Medicare Other

## 2017-11-16 ENCOUNTER — Ambulatory Visit (HOSPITAL_BASED_OUTPATIENT_CLINIC_OR_DEPARTMENT_OTHER): Payer: Medicare Other | Admitting: Physical Medicine & Rehabilitation

## 2017-11-16 ENCOUNTER — Encounter: Payer: Medicare Other | Attending: Physical Medicine & Rehabilitation

## 2017-11-16 ENCOUNTER — Encounter: Payer: Self-pay | Admitting: Physical Medicine & Rehabilitation

## 2017-11-16 VITALS — BP 125/73 | HR 78

## 2017-11-16 DIAGNOSIS — G8929 Other chronic pain: Secondary | ICD-10-CM | POA: Insufficient documentation

## 2017-11-16 DIAGNOSIS — G8110 Spastic hemiplegia affecting unspecified side: Secondary | ICD-10-CM

## 2017-11-16 DIAGNOSIS — E669 Obesity, unspecified: Secondary | ICD-10-CM | POA: Insufficient documentation

## 2017-11-16 DIAGNOSIS — I10 Essential (primary) hypertension: Secondary | ICD-10-CM | POA: Insufficient documentation

## 2017-11-16 DIAGNOSIS — E785 Hyperlipidemia, unspecified: Secondary | ICD-10-CM | POA: Insufficient documentation

## 2017-11-16 DIAGNOSIS — I69354 Hemiplegia and hemiparesis following cerebral infarction affecting left non-dominant side: Secondary | ICD-10-CM | POA: Diagnosis present

## 2017-11-16 DIAGNOSIS — E119 Type 2 diabetes mellitus without complications: Secondary | ICD-10-CM | POA: Insufficient documentation

## 2017-11-16 DIAGNOSIS — Z5189 Encounter for other specified aftercare: Secondary | ICD-10-CM | POA: Diagnosis present

## 2017-11-16 NOTE — Patient Instructions (Signed)

## 2017-11-16 NOTE — Progress Notes (Signed)
Botox Injection for spasticity using needle EMG guidance  Dilution: 50 Units/ml Indication: Severe spasticity which interferes with ADL,mobility and/or  hygiene and is unresponsive to medication management and other conservative care Informed consent was obtained after describing risks and benefits of the procedure with the patient. This includes bleeding, bruising, infection, excessive weakness, or medication side effects. A REMS form is on file and signed. Needle: 27g 1 needle electrode Number of units per muscle Biceps25 Brachiorad 50 FCR25 FCU25 FDS25 FDP0,  pronator teres 50 Pronator quadratus, 50  All injections were done after obtaining appropriate EMG activity and after negative drawback for blood. The patient tolerated the procedure well. Post procedure instructions were given. A followup appointment was made.

## 2017-11-19 ENCOUNTER — Telehealth: Payer: Self-pay

## 2017-11-19 NOTE — Telephone Encounter (Addendum)
[place]: Spoke w/ nurse Donalynn Furlong. Gave VO per Dr. Cruzita Lederer. Increase Lantus to 60 units in a.m and 55 units at bedtime. Place b/s readings in scan basket.

## 2017-11-19 NOTE — Telephone Encounter (Signed)
Okay to increase Lantus to 70 units twice a day then.

## 2017-11-19 NOTE — Telephone Encounter (Addendum)
Tiffany Nurse at [facility] called, states that patient returned from her appointment with [place] with papers that need to be filled out.  Unsure how to fill out paperwork.  Called them back and they explained that they have never recieved our standard office Health and History form and were unsure if they could fill it out or the patient.  I explained to them that a nurse can fill it out for her and sign for her as well and to insure that it comes back with the patient on her next office visit.

## 2017-11-19 NOTE — Telephone Encounter (Signed)
Nurse Tiffany @ [facility] called back and stated when she checked the patient's MAR, patient is currently taking:  Lantus: 60 units bid Novalog:  50 units @ breakfast      55 units @ lunch                 45 units @ dinner

## 2017-11-20 NOTE — Telephone Encounter (Addendum)
Spoke w/ Tiffany.  Gave med instructions. Nurse requested orders faxed to [phone]. Faxed letter w/ instructions.

## 2017-12-09 ENCOUNTER — Ambulatory Visit (INDEPENDENT_AMBULATORY_CARE_PROVIDER_SITE_OTHER): Payer: Medicare Other | Admitting: *Deleted

## 2017-12-09 DIAGNOSIS — I639 Cerebral infarction, unspecified: Secondary | ICD-10-CM

## 2017-12-09 NOTE — Progress Notes (Signed)
[facility] Summary Report / Loop Recorder 

## 2017-12-14 LAB — CUP PACEART REMOTE DEVICE CHECK
Implantable Pulse Generator Implant Date: 20170705
MDC IDC SESS DTM: 20190301131127

## 2017-12-23 ENCOUNTER — Telehealth: Payer: Self-pay | Admitting: Cardiology

## 2017-12-23 NOTE — Telephone Encounter (Signed)
Spoke w/ pt nurse and requested that she send a manual transmission b/c her home monitor has not updated in at least 14 days.

## 2017-12-24 ENCOUNTER — Encounter: Payer: Self-pay | Admitting: Physical Medicine & Rehabilitation

## 2017-12-24 ENCOUNTER — Encounter: Payer: Medicare Other | Attending: Physical Medicine & Rehabilitation

## 2017-12-24 ENCOUNTER — Ambulatory Visit (HOSPITAL_BASED_OUTPATIENT_CLINIC_OR_DEPARTMENT_OTHER): Payer: Medicare Other | Admitting: Physical Medicine & Rehabilitation

## 2017-12-24 VITALS — BP 98/60 | HR 89 | Resp 14 | Ht 61.0 in | Wt 208.0 lb

## 2017-12-24 DIAGNOSIS — E785 Hyperlipidemia, unspecified: Secondary | ICD-10-CM | POA: Diagnosis not present

## 2017-12-24 DIAGNOSIS — I639 Cerebral infarction, unspecified: Secondary | ICD-10-CM

## 2017-12-24 DIAGNOSIS — E669 Obesity, unspecified: Secondary | ICD-10-CM | POA: Insufficient documentation

## 2017-12-24 DIAGNOSIS — Z5189 Encounter for other specified aftercare: Secondary | ICD-10-CM | POA: Diagnosis present

## 2017-12-24 DIAGNOSIS — G8929 Other chronic pain: Secondary | ICD-10-CM | POA: Insufficient documentation

## 2017-12-24 DIAGNOSIS — I69354 Hemiplegia and hemiparesis following cerebral infarction affecting left non-dominant side: Secondary | ICD-10-CM | POA: Insufficient documentation

## 2017-12-24 DIAGNOSIS — G8110 Spastic hemiplegia affecting unspecified side: Secondary | ICD-10-CM | POA: Diagnosis not present

## 2017-12-24 DIAGNOSIS — E119 Type 2 diabetes mellitus without complications: Secondary | ICD-10-CM | POA: Diagnosis not present

## 2017-12-24 DIAGNOSIS — I10 Essential (primary) hypertension: Secondary | ICD-10-CM | POA: Insufficient documentation

## 2017-12-24 NOTE — Patient Instructions (Signed)
Will repeat botox next visit

## 2017-12-24 NOTE — Progress Notes (Addendum)
Subjective:    Patient ID: Danielle Vargas, female    DOB: Apr 02, 1951, 67 y.o.   MRN: [id number]  HPI 67 year old female with history of left spastic hemiplegia secondary to right CVA returns after Botulinum toxin injection performed on 11/16/2017. Patient is wheelchair-bound and uses Hoyer lift Following muscle groups were injected Biceps25 Brachiorad 26 FCR25 FCU25 FDS25 FDP0,  pronator teres 50 Pronator quadratus, 50  No postprocedural complications.  Patient resides in a skilled nursing facility and is accompanied by staff Pain Inventory Average Pain 1 Pain Right Now 1 My pain is dull  In the last 24 hours, has pain interfered with the following? General activity 1 Relation with others 1 Enjoyment of life 1 What TIME of day is your pain at its worst? evening Sleep (in general) Good  Pain is worse with: sitting and inactivity Pain improves with: nothing Relief from Meds: 0  Mobility walk with assistance use a cane do you drive?  no use a wheelchair needs help with transfers  Function retired I need assistance with the following:  dressing, bathing, toileting, meal prep, household duties and shopping  Neuro/Psych trouble walking  Prior Studies Any changes since last visit?  no  Physicians involved in your care Any changes since last visit?  no   Family History  Problem Relation Age of Onset  . COPD Father   . Stroke Father   . Emphysema Father   . Heart failure Mother   . Heart attack Mother        several  . Diabetes Mother   . Kidney disease Mother   . Heart failure Brother   . Hypertension Brother   . Colon cancer Unknown 45       mat. 1st cousin  . Other Unknown        celiac sprue, 1/2 brother  . Diabetes Brother        x 3  . Lung cancer Brother   . Cirrhosis Sister        liver transplant  . Liver disease Sister        transplant  . Heart attack Paternal Grandfather   . Heart attack Brother   . Colon cancer Paternal Aunt 55    . Esophageal cancer Neg Hx   . Stomach cancer Neg Hx   . Rectal cancer Neg Hx    Social History   Socioeconomic History  . Marital status: Divorced    Spouse name: Not on file  . Number of children: 2  . Years of education: Not on file  . Highest education level: Not on file  Occupational History  . Occupation: disabled    Fish farm manager: UNEMPLOYED  Social Needs  . Financial resource strain: Not on file  . Food insecurity:    Worry: Not on file    Inability: Not on file  . Transportation needs:    Medical: Not on file    Non-medical: Not on file  Tobacco Use  . Smoking status: Never Smoker  . Smokeless tobacco: Never Used  Substance and Sexual Activity  . Alcohol use: No  . Drug use: No  . Sexual activity: Not Currently    Birth control/protection: Post-menopausal  Lifestyle  . Physical activity:    Days per week: Not on file    Minutes per session: Not on file  . Stress: Not on file  Relationships  . Social connections:    Talks on phone: Not on file    Gets together: Not on file  Attends religious service: Not on file    Active member of club or organization: Not on file    Attends meetings of clubs or organizations: Not on file    Relationship status: Not on file  Other Topics Concern  . Not on file  Social History Narrative   Lives in [facility] alone.  Retired.  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   Regular exercise: none   Caffeine use: daily; dt coke         Past Surgical History:  Procedure Laterality Date  . BREAST REDUCTION SURGERY  '88  . CARPAL TUNNEL RELEASE  2000   right wrist  . CERVICAL DISC SURGERY  '98   fusion  . CESAREAN SECTION  '79  '84  . COLONOSCOPY    . CORONARY BALLOON ANGIOPLASTY N/A 11/19/2016   Procedure: Coronary Balloon Angioplasty;  Surgeon: Sherren Mocha, MD;  Location: [place] [facility];  Service: Cardiovascular;  Laterality: N/A;  . EP IMPLANTABLE DEVICE N/A 03/12/2016   Procedure: Loop Recorder Insertion;   Surgeon: Will Meredith Leeds, MD;  Location: [place] [facility];  Service: Cardiovascular;  Laterality: N/A;  . LEFT HEART CATH AND CORONARY ANGIOGRAPHY N/A 11/19/2016   Procedure: Left Heart Cath and Coronary Angiography;  Surgeon: Sherren Mocha, MD;  Location: [place] [facility];  Service: Cardiovascular;  Laterality: N/A;  . LEFT HEART CATHETERIZATION WITH CORONARY ANGIOGRAM N/A 10/26/2013   Procedure: LEFT HEART CATHETERIZATION WITH CORONARY ANGIOGRAM;  Surgeon: Sinclair Grooms, MD;  Location[facility] CATH LAB;  Service: Cardiovascular;  Laterality: N/A;  . ROTATOR CUFF REPAIR  '09   left shoulder  . TEE WITHOUT CARDIOVERSION N/A 03/12/2016   Procedure: TRANSESOPHAGEAL ECHOCARDIOGRAM (TEE);  Surgeon: Sanda Klein, MD;  Location: [facility] ENDOSCOPY;  Service: Cardiovascular;  Laterality: N/A;  . TUBAL LIGATION  '85  . ULNAR NERVE REPAIR  '02, '08   left done, then right   Past Medical History:  Diagnosis Date  . Acute combined systolic and diastolic heart failure ([place]) 11/19/2016  . Allergy   . Asthma   . CAD (coronary artery disease)    a. s/p lateral STEMI in 2015 2ry to occlusion of OM1 thought to be caused by coronary artery dissection, treated with POBA. b. 11/2016: STEMI with thrombotic occlusion of distal LCx which was treated with balloon angioplasty.   . Cataract    "just beginning"  . Colon polyps 7/07   hyperplastic and adenomatous  . Depression    pt unsure, psychologist said no  . Diverticulosis   . DM (diabetes mellitus) ([place])   . Dyslipidemia   . External hemorrhoid   . Fatty liver   . Gastroparesis   . HTN (hypertension)   . Hyperlipidemia   . 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 Hypothyroidism   . Migraine   . Myocardial infarction ([place])    FEb 2015  . Nephrolithiasis   . Obesity   . Osteoporosis   . Sleep apnea    wears CPAP  . ST elevation myocardial infarction (STEMI) of inferolateral wall ([employer]) 11/19/2016  . Stroke ([place])    BP 98/60   Pulse 89   Resp  14   Ht 5\' 1"  (1.549 m) Comment: provided by skilled nursing facility  Wt 208 lb (94.3 kg) Comment: provided by skilled nursing facility  SpO2 95%   BMI 39.30 kg/m   Opioid Risk Score:   Fall Risk Score:  `1  Depression screen PHQ 2/9  Depression  screen [facility] 2/9 09/25/2017 10/09/2016 07/15/2016 09/21/2015 09/18/2014 12/26/2013  Decreased Interest 0 0 0 0 1 0  Down, Depressed, Hopeless 0 0 0 0 1 1  PHQ - 2 Score 0 0 0 0 2 1  Some recent data might be hidden    Review of Systems  Constitutional: Negative.   HENT: Negative.   Eyes: Negative.   Respiratory: Negative.   Cardiovascular: Negative.   Gastrointestinal: Negative.   Endocrine: Negative.   Genitourinary: Negative.   Musculoskeletal: Positive for gait problem.  Skin: Negative.   Allergic/Immunologic: Negative.   Hematological: Negative.   Psychiatric/Behavioral: Negative.   All other systems reviewed and are negative.      Objective:   Physical Exam  Constitutional: She appears well-developed and well-nourished. No distress.  HENT:  Head: Normocephalic and atraumatic.  Eyes: Pupils are equal, round, and reactive to light. Conjunctivae and EOM are normal.  Skin: She is not diaphoretic.  Psychiatric: She has a normal mood and affect.  Nursing note and vitals reviewed.   Motor strength is 2- at the left deltoid biceps and triceps.  0 at the finger flexors and extensors. MAS 1 at the finger flexors MAS 1 at the wrist flexors MAS 0 at the elbow flexors.  MAS 1 at the pronators  General no acute distress Mood and affect are appropriate  Sitting balance is poor      Assessment & Plan:  1.  Left spastic hemiplegia secondary to right CVA, improvements in tone after botulinum toxin injection.  Total of 250 units injected. Would continue current dosing and repeat in approximately 8 weeks

## 2017-12-29 ENCOUNTER — Encounter: Payer: Self-pay | Admitting: Cardiology

## 2017-12-30 ENCOUNTER — Telehealth: Payer: Self-pay

## 2017-12-30 NOTE — Telephone Encounter (Signed)
Called nursing home. Scheduled appt for 04/25 w/ nurse per Dr. Darnell Level. Confirmed by patient.

## 2017-12-31 ENCOUNTER — Encounter: Payer: Self-pay | Admitting: Internal Medicine

## 2017-12-31 ENCOUNTER — Ambulatory Visit (INDEPENDENT_AMBULATORY_CARE_PROVIDER_SITE_OTHER): Payer: Medicare Other | Admitting: Internal Medicine

## 2017-12-31 VITALS — BP 134/78 | HR 78

## 2017-12-31 DIAGNOSIS — E139 Other specified diabetes mellitus without complications: Secondary | ICD-10-CM | POA: Diagnosis not present

## 2017-12-31 DIAGNOSIS — E038 Other specified hypothyroidism: Secondary | ICD-10-CM | POA: Diagnosis not present

## 2017-12-31 DIAGNOSIS — I639 Cerebral infarction, unspecified: Secondary | ICD-10-CM | POA: Diagnosis not present

## 2017-12-31 LAB — POCT GLYCOSYLATED HEMOGLOBIN (HGB A1C)

## 2017-12-31 NOTE — Progress Notes (Signed)
Patient ID: Danielle Vargas, female   DOB: 02-Feb-1951, 67 y.o.   MRN: [id number]   HPI: Danielle Vargas is a 67 y.o.-year-old woman, returning for f/u for of DM - LADA dx 1999, insulin-dependent since 10/2012, uncontrolled, with complications (CAD- s/p STEMI 10/2013, STEMI 2018, cerebro-vascular ds. 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 Last visit 6 months ago. She has [name].   She continues to reside in SNF after her stroke.   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   She is on: - Lantus 50 >> 70 units in am and 45 >> 70 units at bedtime - Novolin R:  Before b'fast: 50 units  Before lunch: 55 units  Before dinner: 45 units If sugars before a meal 70-90, then only give Novolog 20 units before that meal. If sugars before a meal <70, then skip Novolog before that meal. She has been on Glipizide in the past, too. She tried Invokana in the past >> severe diarrhea Also tried U500 >> could not afford this.   Patient has her sugars checked 4 times a day at the facility: - am: 102, 129-228 in last week, prev: 173-374 >> 179- 342 - 2h after b'fast: 115-205, 239 >> n/c - before lunch: 275-354, 404 >> 91-249, 292, 201-335 >> 202-383 - 2h after lunch: 83, 126-199 >> 57x1, 99-247 >> n/c - before dinner:  178-302, 384 >> 199-326, 222-359 >> 249- 305 - 2h after dinner:  111-214 >> 109-273, 339 >> n/c - bedtime: 135-227, 283 >>  231-364 >> 208-348 >> 190- 313, 497 - nighttime: 75-232 >> 136-291 >> n/c Lowest 172 >> 91 x1 >> 179; she has hypoglycemia awareness i in the 90s. Highest sugar was 434 x1 >> 497.   -+ Mild CKD, last BUN/creatinine:  Recent Labs       Lab Results  Component Value Date    BUN 17 07/07/2017    CREATININE 1.27 (H) 07/07/2017    On losartan. -+ HL:  Lab Results  Component Value Date   CHOL 117 11/20/2016   HDL 23 (L) 11/20/2016   LDLCALC 49 11/20/2016   TRIG 225 (H) 11/20/2016   CHOLHDL 5.1 11/20/2016  02/2015: TG 252 (PCP). On Crestor and fenofibrate. 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  - + numbness and tingling in her feet.   Continues Neurontin.   She also has a history of HTN, fatty liver, GERD, morbid obesity, hypothyroidism, migraines, chronic back pain, history of nephrolithiasis, osteoporosis, diverticulosis.  She has OSA >> on CPAP.  She also had pancreatitis (03/21/2015).  She has been admitted for CP 11/13/2015 >> r/o for AMI. She saw Dr Sallyanne Kuster in the hospital, her cardiologist is Dr. Tamala Julian. 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 She is still in a wheelchair. Continues to live in [facility].  She also has hypothyroidism.  Normal TSH at last check: Lab Results  Component Value Date   TSH 2.020 12/03/2016    On Levothyroxine  100 mcg daily.  ROS: Constitutional: no weight gain/no weight loss, no fatigue, no subjective hyperthermia, no subjective hypothermia Eyes: no blurry vision, no xerophthalmia ENT: no sore throat, no nodules palpated in throat, no dysphagia, no odynophagia, no hoarseness Cardiovascular: no CP/no SOB/no palpitations/no leg swelling Respiratory: no cough/no SOB/no wheezing Gastrointestinal: no N/no V/no D/no C/no acid reflux Musculoskeletal: no muscle aches/no joint aches Skin: no rashes, no hair loss Neurological: no tremors/+ numbness/+ tingling/no dizziness- paresis in L arm  I reviewed pt's medications,  allergies, PMH, social hx, family hx, and changes were documented in the history of present illness. Otherwise, unchanged from my initial visit note.  PE: BP 134/78   Pulse 78   SpO2 95%  There is no height or weight on file to calculate BMI.  Patient is on a wheelchair and could not be weighed. Wt Readings from Last 3 Encounters:  12/24/17 208 lb (94.3 kg)  07/07/17 215 lb (97.5 kg)  03/26/17 203 lb (92.1 kg)   Constitutional: overweight, in NAD, in wheelchair. Eyes: PERRLA, EOMI, no exophthalmos ENT: moist mucous membranes, no thyromegaly, no cervical lymphadenopathy Cardiovascular: RRR, No MRG Respiratory: CTA B Gastrointestinal: abdomen soft, NT, ND, BS+ Musculoskeletal: no deformities, strength intact in all 4 Skin: moist, warm, no rashes, + onychodystrophy left hand Neurological: no tremor with outstretched hands,+ L hemiparesis  ASSESSMENT: 1. DM2, insulin-dependent, uncontrolled, with complications - CAD - STEMI 10/26/2013 - had angioplasty, no stent placed. Had 2DEcho on 01/23/2014 EF increased (from 35) to 55%.             - STEMI 11/19/2016. - cerebro-vascular ds. 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  2. Obesity  3. Hypothyroidism  PLAN:  1. Patient with long standing, uncontrolled DM2, with increased insulin resistance, on basal-bolus insulin regimen. At this visit, sugars seem higher than before. We increased her lunchtime insulin at last visit, but her sugars are still high before dinner.  - unfortunately, we cannot use U500 insulin in the facility >> we need to continue Lantus and R insulin - She recently started to change her diet a little, mainly to avoid oatmeal in the morning.  You noticed that her sugars before lunch improved in the last week after this dietary change. - However, they  are still quite high, so we discussed about increasing her mealtime insulin.  We will increase each dose by 10 units.  However, ultimately, he will need more dietary changes for the sugars to improve. - Last visit, we discussed about she inquires about GLP-1 receptor agonist per her questioning, however, I advised her that this is contraindicated with history of gastroparesis and especially pancreatitis  -  I suggested:  Patient Instructions  Please continue - Lantus 70 units in am and 70 at bedtime  Please increase: - Novolin R:  Before b'fast: 60 units  Before lunch: 65 units  Before dinner: 55 units If sugars before a meal 70-90, then only give Novolog 30 units before that meal. If sugars before a meal <70, then skip Novolog before that meal.  Please return in 3-4 months with your sugar log.   - today, HbA1c is 8.8% (higher) - continue checking sugars at different times of the  day - check 4x a day, rotating checks - advised for yearly eye exams >> she is UTD - Return to clinic in 3-4 mo with sugar log    2. Obesity -Class III -Exacerbated by her immobility -Unfortunately we cannot use a GLP-1 receptor agonist or SGLT2 inhibitors so we have to rely on increasing the insulin for diabetes control and this will also worsen her obesity.  3. Hypothyroidism -Controlled -Last TSH normal a year ago -I am not sure if this was not checked at the facility, will need records.  Philemon Kingdom, MD PhD [facility] Endocrinology

## 2017-12-31 NOTE — Patient Instructions (Addendum)
Please continue - Lantus 70 units in am and 70 at bedtime  Please increase: - Novolin R:  Before b'fast: 60 units  Before lunch: 65 units  Before dinner: 55 units If sugars before a meal 70-90, then only give Novolog 30 units before that meal. If sugars before a meal <70, then skip Novolog before that meal.  Please return in 3-4 months with your sugar log.

## 2018-01-11 ENCOUNTER — Encounter: Payer: Medicare Other | Admitting: *Deleted

## 2018-01-13 LAB — CUP PACEART REMOTE DEVICE CHECK
MDC IDC PG IMPLANT DT: 20170705
MDC IDC SESS DTM: 20190403133714

## 2018-01-20 ENCOUNTER — Encounter: Payer: Self-pay | Admitting: Cardiology

## 2018-02-03 ENCOUNTER — Inpatient Hospital Stay (HOSPITAL_COMMUNITY)
Admission: EM | Admit: 2018-02-03 | Discharge: 2018-02-05 | DRG: 281 | Disposition: A | Discharge status: Skilled Nursing Facility | Payer: Medicare Other | Attending: Cardiology | Admitting: Cardiology

## 2018-02-03 ENCOUNTER — Inpatient Hospital Stay (HOSPITAL_COMMUNITY)
Admission: EM | Disposition: A | Discharge status: Skilled Nursing Facility | Payer: Self-pay | Source: Home / Self Care | Attending: Cardiology

## 2018-02-03 ENCOUNTER — Encounter (HOSPITAL_COMMUNITY): Payer: Self-pay | Admitting: Emergency Medicine

## 2018-02-03 ENCOUNTER — Emergency Department (HOSPITAL_COMMUNITY): Payer: Medicare Other

## 2018-02-03 DIAGNOSIS — Z6841 Body Mass Index (BMI) 40.0 and over, adult: Secondary | ICD-10-CM | POA: Diagnosis not present

## 2018-02-03 DIAGNOSIS — Z79890 Hormone replacement therapy (postmenopausal): Secondary | ICD-10-CM | POA: Diagnosis not present

## 2018-02-03 DIAGNOSIS — I219 Acute myocardial infarction, unspecified: Secondary | ICD-10-CM

## 2018-02-03 DIAGNOSIS — E785 Hyperlipidemia, unspecified: Secondary | ICD-10-CM | POA: Diagnosis present

## 2018-02-03 DIAGNOSIS — I2510 Atherosclerotic heart disease of native coronary artery without angina pectoris: Secondary | ICD-10-CM | POA: Diagnosis present

## 2018-02-03 DIAGNOSIS — Z79899 Other long term (current) drug therapy: Secondary | ICD-10-CM

## 2018-02-03 DIAGNOSIS — I214 Non-ST elevation (NSTEMI) myocardial infarction: Secondary | ICD-10-CM | POA: Diagnosis present

## 2018-02-03 DIAGNOSIS — G4733 Obstructive sleep apnea (adult) (pediatric): Secondary | ICD-10-CM | POA: Diagnosis present

## 2018-02-03 DIAGNOSIS — R0789 Other chest pain: Secondary | ICD-10-CM | POA: Diagnosis present

## 2018-02-03 DIAGNOSIS — E039 Hypothyroidism, unspecified: Secondary | ICD-10-CM | POA: Diagnosis present

## 2018-02-03 DIAGNOSIS — Z9981 Dependence on supplemental oxygen: Secondary | ICD-10-CM | POA: Diagnosis not present

## 2018-02-03 DIAGNOSIS — I69354 Hemiplegia and hemiparesis following cerebral infarction affecting left non-dominant side: Secondary | ICD-10-CM | POA: Diagnosis not present

## 2018-02-03 DIAGNOSIS — E119 Type 2 diabetes mellitus without complications: Secondary | ICD-10-CM | POA: Diagnosis present

## 2018-02-03 DIAGNOSIS — I5032 Chronic diastolic (congestive) heart failure: Secondary | ICD-10-CM

## 2018-02-03 DIAGNOSIS — K760 Fatty (change of) liver, not elsewhere classified: Secondary | ICD-10-CM | POA: Diagnosis present

## 2018-02-03 DIAGNOSIS — Z7982 Long term (current) use of aspirin: Secondary | ICD-10-CM

## 2018-02-03 DIAGNOSIS — I5042 Chronic combined systolic (congestive) and diastolic (congestive) heart failure: Secondary | ICD-10-CM | POA: Diagnosis present

## 2018-02-03 DIAGNOSIS — I110 Hypertensive heart disease with heart failure: Secondary | ICD-10-CM | POA: Diagnosis present

## 2018-02-03 DIAGNOSIS — I252 Old myocardial infarction: Secondary | ICD-10-CM | POA: Diagnosis not present

## 2018-02-03 DIAGNOSIS — Z794 Long term (current) use of insulin: Secondary | ICD-10-CM | POA: Diagnosis not present

## 2018-02-03 DIAGNOSIS — Z8249 Family history of ischemic heart disease and other diseases of the circulatory system: Secondary | ICD-10-CM | POA: Diagnosis not present

## 2018-02-03 DIAGNOSIS — I21A9 Other myocardial infarction type: Secondary | ICD-10-CM

## 2018-02-03 DIAGNOSIS — L8990 Pressure ulcer of unspecified site, unspecified stage: Secondary | ICD-10-CM

## 2018-02-03 DIAGNOSIS — L8940 Pressure ulcer of contiguous site of back, buttock and hip, unspecified stage: Secondary | ICD-10-CM | POA: Diagnosis not present

## 2018-02-03 DIAGNOSIS — I5030 Unspecified diastolic (congestive) heart failure: Secondary | ICD-10-CM | POA: Diagnosis not present

## 2018-02-03 DIAGNOSIS — Z7951 Long term (current) use of inhaled steroids: Secondary | ICD-10-CM

## 2018-02-03 DIAGNOSIS — Z833 Family history of diabetes mellitus: Secondary | ICD-10-CM | POA: Diagnosis not present

## 2018-02-03 HISTORY — PX: LEFT HEART CATH AND CORONARY ANGIOGRAPHY: CATH118249

## 2018-02-03 LAB — CBC
HCT: 39.7 % (ref 36.0–46.0)
Hemoglobin: 12.7 g/dL (ref 12.0–15.0)
MCH: 30.5 pg (ref 26.0–34.0)
MCHC: 32 g/dL (ref 30.0–36.0)
MCV: 95.4 fL (ref 78.0–100.0)
PLATELETS: 217 10*3/uL (ref 150–400)
RBC: 4.16 MIL/uL (ref 3.87–5.11)
RDW: 14.3 % (ref 11.5–15.5)
WBC: 6.5 10*3/uL (ref 4.0–10.5)

## 2018-02-03 LAB — CBC WITH DIFFERENTIAL/PLATELET
Abs Immature Granulocytes: 0 10*3/uL (ref 0.0–0.1)
Basophils Absolute: 0.1 10*3/uL (ref 0.0–0.1)
Basophils Relative: 1 %
EOS ABS: 0.2 10*3/uL (ref 0.0–0.7)
Eosinophils Relative: 4 %
HCT: 40.1 % (ref 36.0–46.0)
Hemoglobin: 13.2 g/dL (ref 12.0–15.0)
IMMATURE GRANULOCYTES: 1 %
LYMPHS PCT: 28 %
Lymphs Abs: 1.8 10*3/uL (ref 0.7–4.0)
MCH: 30.8 pg (ref 26.0–34.0)
MCHC: 32.9 g/dL (ref 30.0–36.0)
MCV: 93.7 fL (ref 78.0–100.0)
Monocytes Absolute: 0.5 10*3/uL (ref 0.1–1.0)
Monocytes Relative: 8 %
NEUTROS ABS: 3.8 10*3/uL (ref 1.7–7.7)
NEUTROS PCT: 58 %
Platelets: 244 10*3/uL (ref 150–400)
RBC: 4.28 MIL/uL (ref 3.87–5.11)
RDW: 14.3 % (ref 11.5–15.5)
WBC: 6.4 10*3/uL (ref 4.0–10.5)

## 2018-02-03 LAB — COMPREHENSIVE METABOLIC PANEL
ALT: 44 U/L (ref 14–54)
AST: 69 U/L — AB (ref 15–41)
Albumin: 3.4 g/dL — ABNORMAL LOW (ref 3.5–5.0)
Alkaline Phosphatase: 54 U/L (ref 38–126)
Anion gap: 14 (ref 5–15)
BUN: 17 mg/dL (ref 6–20)
CHLORIDE: 104 mmol/L (ref 101–111)
CO2: 22 mmol/L (ref 22–32)
Calcium: 9.4 mg/dL (ref 8.9–10.3)
Creatinine, Ser: 1.27 mg/dL — ABNORMAL HIGH (ref 0.44–1.00)
GFR, EST AFRICAN AMERICAN: 50 mL/min — AB (ref >60)
GFR, EST NON AFRICAN AMERICAN: 43 mL/min — AB (ref >60)
Glucose, Bld: 365 mg/dL — ABNORMAL HIGH (ref 65–99)
POTASSIUM: 3.9 mmol/L (ref 3.5–5.1)
Sodium: 140 mmol/L (ref 135–145)
Total Bilirubin: 0.8 mg/dL (ref 0.3–1.2)
Total Protein: 7.2 g/dL (ref 6.5–8.1)

## 2018-02-03 LAB — LIPID PANEL
CHOL/HDL RATIO: 5.9 ratio
CHOLESTEROL: 112 mg/dL (ref 0–200)
HDL: 19 mg/dL — AB (ref >40)
LDL Cholesterol: 40 mg/dL (ref 0–99)
TRIGLYCERIDES: 263 mg/dL — AB (ref <150)
VLDL: 53 mg/dL — ABNORMAL HIGH (ref 0–40)

## 2018-02-03 LAB — I-STAT TROPONIN, ED
Troponin i, poc: 0.54 ng/mL (ref 0.00–0.08)
Troponin i, poc: 3.72 ng/mL (ref 0.00–0.08)

## 2018-02-03 LAB — GLUCOSE, CAPILLARY
GLUCOSE-CAPILLARY: 353 mg/dL — AB (ref 65–99)
GLUCOSE-CAPILLARY: 331 mg/dL — AB (ref 65–99)
Glucose-Capillary: 221 mg/dL — ABNORMAL HIGH (ref 65–99)
Glucose-Capillary: 355 mg/dL — ABNORMAL HIGH (ref 65–99)
Glucose-Capillary: 294 mg/dL — ABNORMAL HIGH (ref 65–99)
Glucose-Capillary: 194 mg/dL — ABNORMAL HIGH (ref 65–99)
Glucose-Capillary: 169 mg/dL — ABNORMAL HIGH (ref 65–99)

## 2018-02-03 LAB — BASIC METABOLIC PANEL
Anion gap: 14 (ref 5–15)
BUN: 19 mg/dL (ref 6–20)
CO2: 21 mmol/L — AB (ref 22–32)
CREATININE: 1.2 mg/dL — AB (ref 0.44–1.00)
Calcium: 9.5 mg/dL (ref 8.9–10.3)
Chloride: 104 mmol/L (ref 101–111)
GFR calc non Af Amer: 46 mL/min — ABNORMAL LOW (ref >60)
GFR, EST AFRICAN AMERICAN: 54 mL/min — AB (ref >60)
GLUCOSE: 352 mg/dL — AB (ref 65–99)
Potassium: 4.2 mmol/L (ref 3.5–5.1)
Sodium: 139 mmol/L (ref 135–145)

## 2018-02-03 LAB — TSH: TSH: 3.969 u[IU]/mL (ref 0.350–4.500)

## 2018-02-03 LAB — HEPARIN LEVEL (UNFRACTIONATED): Heparin Unfractionated: 0.19 IU/mL — ABNORMAL LOW (ref 0.30–0.70)

## 2018-02-03 LAB — CBG MONITORING, ED: GLUCOSE-CAPILLARY: 253 mg/dL — AB (ref 65–99)

## 2018-02-03 LAB — MRSA PCR SCREENING: MRSA by PCR: NEGATIVE

## 2018-02-03 LAB — HIV ANTIBODY (ROUTINE TESTING W REFLEX): HIV Screen 4th Generation wRfx: NONREACTIVE

## 2018-02-03 LAB — PROTIME-INR
INR: 1.28
Prothrombin Time: 15.9 seconds — ABNORMAL HIGH (ref 11.4–15.2)

## 2018-02-03 LAB — BRAIN NATRIURETIC PEPTIDE: B NATRIURETIC PEPTIDE 5: 60.8 pg/mL (ref 0.0–100.0)

## 2018-02-03 LAB — APTT: APTT: 151 s — AB (ref 24–36)

## 2018-02-03 LAB — TROPONIN I: TROPONIN I: 0.34 ng/mL — AB (ref <0.03)

## 2018-02-03 SURGERY — LEFT HEART CATH AND CORONARY ANGIOGRAPHY
Anesthesia: LOCAL

## 2018-02-03 MED ORDER — HEPARIN BOLUS VIA INFUSION
4000.0000 [IU] | Freq: Once | INTRAVENOUS | Status: DC
  Filled 2018-02-03: qty 4000

## 2018-02-03 MED ORDER — PANTOPRAZOLE SODIUM 40 MG PO TBEC
40.0000 mg | DELAYED_RELEASE_TABLET | Freq: Every day | ORAL | Status: DC
  Administered 2018-02-03 – 2018-02-05 (×3): 40 mg via ORAL
  Filled 2018-02-03 (×3): qty 1

## 2018-02-03 MED ORDER — SODIUM CHLORIDE 0.9% FLUSH: 3.0000 mL | Freq: Two times a day (BID) | INTRAVENOUS | Status: DC

## 2018-02-03 MED ORDER — TICAGRELOR 90 MG PO TABS
180.0000 mg | ORAL_TABLET | Freq: Once | ORAL | Status: AC
  Administered 2018-02-03: 180 mg via ORAL
  Filled 2018-02-03: qty 2

## 2018-02-03 MED ORDER — LIDOCAINE HCL (PF) 1 % IJ SOLN
INTRAMUSCULAR | Status: DC | PRN
  Administered 2018-02-03: 2 mL

## 2018-02-03 MED ORDER — GABAPENTIN 300 MG PO CAPS
300.0000 mg | ORAL_CAPSULE | Freq: Two times a day (BID) | ORAL | Status: DC
  Administered 2018-02-03 – 2018-02-05 (×5): 300 mg via ORAL
  Filled 2018-02-03 (×5): qty 1

## 2018-02-03 MED ORDER — CARVEDILOL 6.25 MG PO TABS
6.2500 mg | ORAL_TABLET | Freq: Two times a day (BID) | ORAL | Status: DC
  Administered 2018-02-03 – 2018-02-05 (×5): 6.25 mg via ORAL
  Filled 2018-02-03 (×5): qty 1

## 2018-02-03 MED ORDER — FENTANYL CITRATE (PF) 100 MCG/2ML IJ SOLN
INTRAMUSCULAR | Status: AC
  Filled 2018-02-03: qty 2

## 2018-02-03 MED ORDER — ASPIRIN 81 MG PO CHEW
81.0000 mg | CHEWABLE_TABLET | Freq: Every day | ORAL | Status: DC
  Administered 2018-02-04 – 2018-02-05 (×2): 81 mg via ORAL
  Filled 2018-02-03 (×2): qty 1

## 2018-02-03 MED ORDER — ACETAMINOPHEN 325 MG PO TABS: 650.0000 mg | ORAL_TABLET | Freq: Four times a day (QID) | ORAL | Status: DC | PRN

## 2018-02-03 MED ORDER — MIDAZOLAM HCL 2 MG/2ML IJ SOLN
INTRAMUSCULAR | Status: DC | PRN
  Administered 2018-02-03: 0.5 mg via INTRAVENOUS

## 2018-02-03 MED ORDER — OXYCODONE HCL 5 MG PO TABS: 5.0000 mg | ORAL_TABLET | ORAL | Status: DC | PRN

## 2018-02-03 MED ORDER — ONDANSETRON HCL 4 MG/2ML IJ SOLN: 4.0000 mg | Freq: Four times a day (QID) | INTRAMUSCULAR | Status: DC | PRN

## 2018-02-03 MED ORDER — ISOSORBIDE MONONITRATE ER 30 MG PO TB24
30.0000 mg | ORAL_TABLET | Freq: Every day | ORAL | Status: DC
  Administered 2018-02-03 – 2018-02-05 (×3): 30 mg via ORAL
  Filled 2018-02-03 (×3): qty 1

## 2018-02-03 MED ORDER — INSULIN ASPART 100 UNIT/ML ~~LOC~~ SOLN
SUBCUTANEOUS | Status: AC
  Filled 2018-02-03: qty 1

## 2018-02-03 MED ORDER — ASPIRIN EC 81 MG PO TBEC
81.0000 mg | DELAYED_RELEASE_TABLET | Freq: Every day | ORAL | Status: DC
  Administered 2018-02-03: 81 mg via ORAL
  Filled 2018-02-03: qty 1

## 2018-02-03 MED ORDER — TICAGRELOR 90 MG PO TABS: 90.0000 mg | ORAL_TABLET | Freq: Two times a day (BID) | ORAL | Status: DC

## 2018-02-03 MED ORDER — SODIUM CHLORIDE 0.9 % WEIGHT BASED INFUSION
1.0000 mL/kg/h | INTRAVENOUS | Status: DC
  Administered 2018-02-03: 1 mL/kg/h via INTRAVENOUS

## 2018-02-03 MED ORDER — POLYETHYLENE GLYCOL 3350 17 G PO PACK
17.0000 g | PACK | ORAL | Status: DC
  Administered 2018-02-05: 17 g via ORAL
  Filled 2018-02-03: qty 1

## 2018-02-03 MED ORDER — VERAPAMIL HCL 2.5 MG/ML IV SOLN
INTRAVENOUS | Status: DC | PRN
  Administered 2018-02-03: 11:00:00 via INTRA_ARTERIAL

## 2018-02-03 MED ORDER — SODIUM CHLORIDE 0.9% FLUSH
3.0000 mL | Freq: Two times a day (BID) | INTRAVENOUS | Status: DC
  Administered 2018-02-03 – 2018-02-04 (×3): 3 mL via INTRAVENOUS

## 2018-02-03 MED ORDER — ALBUTEROL SULFATE (2.5 MG/3ML) 0.083% IN NEBU: 2.5000 mg | INHALATION_SOLUTION | Freq: Four times a day (QID) | RESPIRATORY_TRACT | Status: DC | PRN

## 2018-02-03 MED ORDER — CEFAZOLIN SODIUM-DEXTROSE 2-4 GM/100ML-% IV SOLN
INTRAVENOUS | Status: AC
  Filled 2018-02-03: qty 100

## 2018-02-03 MED ORDER — NITROGLYCERIN 0.4 MG SL SUBL
0.4000 mg | SUBLINGUAL_TABLET | SUBLINGUAL | Status: DC | PRN
Start: 2018-02-03 — End: 2018-02-05
  Administered 2018-02-03: 0.4 mg via SUBLINGUAL

## 2018-02-03 MED ORDER — HEPARIN SODIUM (PORCINE) 5000 UNIT/ML IJ SOLN
4000.0000 [IU] | Freq: Once | INTRAMUSCULAR | Status: AC
  Administered 2018-02-03: 4000 [IU] via INTRAVENOUS
  Filled 2018-02-03: qty 1

## 2018-02-03 MED ORDER — MOMETASONE FURO-FORMOTEROL FUM 200-5 MCG/ACT IN AERO
2.0000 | INHALATION_SPRAY | Freq: Two times a day (BID) | RESPIRATORY_TRACT | Status: DC
  Administered 2018-02-03 – 2018-02-05 (×3): 2 via RESPIRATORY_TRACT
  Filled 2018-02-03: qty 8.8

## 2018-02-03 MED ORDER — SODIUM CHLORIDE 0.9 % IV SOLN: 250.0000 mL | INTRAVENOUS | Status: DC | PRN

## 2018-02-03 MED ORDER — INSULIN GLARGINE 100 UNIT/ML ~~LOC~~ SOLN
45.0000 [IU] | Freq: Two times a day (BID) | SUBCUTANEOUS | Status: DC
  Administered 2018-02-03 – 2018-02-05 (×4): 45 [IU] via SUBCUTANEOUS
  Filled 2018-02-03 (×7): qty 0.45

## 2018-02-03 MED ORDER — ASPIRIN EC 81 MG PO TBEC: 81.0000 mg | DELAYED_RELEASE_TABLET | Freq: Every day | ORAL | Status: DC

## 2018-02-03 MED ORDER — HEPARIN (PORCINE) IN NACL 100-0.45 UNIT/ML-% IJ SOLN
1200.0000 [IU]/h | INTRAMUSCULAR | Status: DC
  Filled 2018-02-03: qty 250

## 2018-02-03 MED ORDER — FLUTICASONE PROPIONATE 50 MCG/ACT NA SUSP
2.0000 | Freq: Every day | NASAL | Status: DC
  Administered 2018-02-04 – 2018-02-05 (×2): 2 via NASAL
  Filled 2018-02-03: qty 16

## 2018-02-03 MED ORDER — FUROSEMIDE 40 MG PO TABS
40.0000 mg | ORAL_TABLET | ORAL | Status: DC
  Administered 2018-02-03 – 2018-02-05 (×3): 40 mg via ORAL
  Filled 2018-02-03 (×2): qty 1
  Filled 2018-02-03: qty 2

## 2018-02-03 MED ORDER — NITROGLYCERIN IN D5W 200-5 MCG/ML-% IV SOLN
0.0000 ug/min | Freq: Once | INTRAVENOUS | Status: AC
  Administered 2018-02-03: 5 ug/min via INTRAVENOUS
  Filled 2018-02-03: qty 250

## 2018-02-03 MED ORDER — DOCUSATE SODIUM 100 MG PO CAPS
100.0000 mg | ORAL_CAPSULE | Freq: Two times a day (BID) | ORAL | Status: DC
  Administered 2018-02-03 – 2018-02-05 (×3): 100 mg via ORAL
  Filled 2018-02-03 (×4): qty 1

## 2018-02-03 MED ORDER — MIDAZOLAM HCL 2 MG/2ML IJ SOLN
INTRAMUSCULAR | Status: AC
  Filled 2018-02-03: qty 2

## 2018-02-03 MED ORDER — ACETAMINOPHEN 325 MG PO TABS
650.0000 mg | ORAL_TABLET | ORAL | Status: DC | PRN
  Administered 2018-02-04: 650 mg via ORAL
  Filled 2018-02-03: qty 2

## 2018-02-03 MED ORDER — ROSUVASTATIN CALCIUM 20 MG PO TABS
20.0000 mg | ORAL_TABLET | Freq: Every day | ORAL | Status: DC
  Administered 2018-02-04 – 2018-02-05 (×2): 20 mg via ORAL
  Filled 2018-02-03 (×3): qty 1

## 2018-02-03 MED ORDER — HEPARIN (PORCINE) IN NACL 100-0.45 UNIT/ML-% IJ SOLN
1200.0000 [IU]/h | INTRAMUSCULAR | Status: DC
  Administered 2018-02-03: 1000 [IU]/h via INTRAVENOUS
  Filled 2018-02-03 (×2): qty 250

## 2018-02-03 MED ORDER — LIDOCAINE HCL (PF) 1 % IJ SOLN
INTRAMUSCULAR | Status: AC
  Filled 2018-02-03: qty 30

## 2018-02-03 MED ORDER — TOPIRAMATE 25 MG PO TABS
50.0000 mg | ORAL_TABLET | Freq: Two times a day (BID) | ORAL | Status: DC
  Administered 2018-02-03 – 2018-02-05 (×4): 50 mg via ORAL
  Filled 2018-02-03 (×6): qty 2

## 2018-02-03 MED ORDER — GUAIFENESIN 200 MG PO TABS
600.0000 mg | ORAL_TABLET | Freq: Two times a day (BID) | ORAL | Status: DC | PRN
  Filled 2018-02-03: qty 3

## 2018-02-03 MED ORDER — ATORVASTATIN CALCIUM 80 MG PO TABS
80.0000 mg | ORAL_TABLET | Freq: Every day | ORAL | Status: DC
Start: 2018-02-03 — End: 2018-02-03

## 2018-02-03 MED ORDER — HEPARIN SODIUM (PORCINE) 1000 UNIT/ML IJ SOLN
INTRAMUSCULAR | Status: AC
  Filled 2018-02-03: qty 1

## 2018-02-03 MED ORDER — INSULIN ASPART 100 UNIT/ML ~~LOC~~ SOLN
70.0000 [IU] | Freq: Two times a day (BID) | SUBCUTANEOUS | Status: DC
  Administered 2018-02-04 – 2018-02-05 (×4): 70 [IU] via SUBCUTANEOUS

## 2018-02-03 MED ORDER — SODIUM CHLORIDE 0.9% FLUSH: 3.0000 mL | INTRAVENOUS | Status: DC | PRN

## 2018-02-03 MED ORDER — HEPARIN (PORCINE) IN NACL 2-0.9 UNITS/ML
INTRAMUSCULAR | Status: AC | PRN
  Administered 2018-02-03 (×2): 500 mL via INTRA_ARTERIAL

## 2018-02-03 MED ORDER — MONTELUKAST SODIUM 10 MG PO TABS
10.0000 mg | ORAL_TABLET | Freq: Every day | ORAL | Status: DC
  Administered 2018-02-03 – 2018-02-04 (×2): 10 mg via ORAL
  Filled 2018-02-03 (×2): qty 1

## 2018-02-03 MED ORDER — ASPIRIN 81 MG PO CHEW: 81.0000 mg | CHEWABLE_TABLET | ORAL | Status: DC

## 2018-02-03 MED ORDER — CLOPIDOGREL BISULFATE 75 MG PO TABS
75.0000 mg | ORAL_TABLET | Freq: Every day | ORAL | Status: DC
  Administered 2018-02-04 – 2018-02-05 (×2): 75 mg via ORAL
  Filled 2018-02-03 (×2): qty 1

## 2018-02-03 MED ORDER — NITROGLYCERIN 0.4 MG SL SUBL: 0.4000 mg | SUBLINGUAL_TABLET | SUBLINGUAL | Status: DC | PRN

## 2018-02-03 MED ORDER — FENOFIBRATE 160 MG PO TABS
160.0000 mg | ORAL_TABLET | Freq: Every day | ORAL | Status: DC
  Administered 2018-02-03 – 2018-02-04 (×2): 160 mg via ORAL
  Filled 2018-02-03 (×2): qty 1

## 2018-02-03 MED ORDER — CEFAZOLIN SODIUM-DEXTROSE 2-3 GM-%(50ML) IV SOLR
INTRAVENOUS | Status: AC | PRN
  Administered 2018-02-03: 2 g via INTRAVENOUS

## 2018-02-03 MED ORDER — LEVOTHYROXINE SODIUM 100 MCG PO TABS
100.0000 ug | ORAL_TABLET | Freq: Every day | ORAL | Status: DC
  Administered 2018-02-04 – 2018-02-05 (×2): 100 ug via ORAL
  Filled 2018-02-03 (×3): qty 1

## 2018-02-03 MED ORDER — ADULT MULTIVITAMIN W/MINERALS CH
1.0000 | ORAL_TABLET | Freq: Every day | ORAL | Status: DC
  Administered 2018-02-03 – 2018-02-05 (×3): 1 via ORAL
  Filled 2018-02-03 (×3): qty 1

## 2018-02-03 MED ORDER — SODIUM CHLORIDE 0.9 % WEIGHT BASED INFUSION: 3.0000 mL/kg/h | INTRAVENOUS | Status: DC

## 2018-02-03 MED ORDER — IOHEXOL 350 MG/ML SOLN
INTRAVENOUS | Status: DC | PRN
  Administered 2018-02-03: 60 mL via INTRAVENOUS

## 2018-02-03 MED ORDER — HEPARIN (PORCINE) IN NACL 1000-0.9 UT/500ML-% IV SOLN
INTRAVENOUS | Status: AC
Start: 2018-02-03 — End: ?
  Filled 2018-02-03: qty 1000

## 2018-02-03 MED ORDER — HEPARIN SODIUM (PORCINE) 1000 UNIT/ML IJ SOLN
INTRAMUSCULAR | Status: DC | PRN
  Administered 2018-02-03: 5000 [IU] via INTRAVENOUS

## 2018-02-03 MED ORDER — TIZANIDINE HCL 2 MG PO TABS
2.0000 mg | ORAL_TABLET | Freq: Three times a day (TID) | ORAL | Status: DC | PRN
  Administered 2018-02-05: 2 mg via ORAL
  Filled 2018-02-03: qty 1

## 2018-02-03 MED ORDER — METOCLOPRAMIDE HCL 10 MG PO TABS
10.0000 mg | ORAL_TABLET | Freq: Two times a day (BID) | ORAL | Status: DC
  Administered 2018-02-03 – 2018-02-05 (×5): 10 mg via ORAL
  Filled 2018-02-03 (×5): qty 1

## 2018-02-03 MED ORDER — FENTANYL CITRATE (PF) 100 MCG/2ML IJ SOLN
INTRAMUSCULAR | Status: DC | PRN
  Administered 2018-02-03: 25 ug via INTRAVENOUS

## 2018-02-03 MED ORDER — INSULIN ASPART 100 UNIT/ML ~~LOC~~ SOLN
55.0000 [IU] | Freq: Every day | SUBCUTANEOUS | Status: DC
Start: 2018-02-03 — End: 2018-02-05
  Administered 2018-02-04: 55 [IU] via SUBCUTANEOUS

## 2018-02-03 MED ORDER — ASPIRIN 81 MG PO TBEC: 81.0000 mg | DELAYED_RELEASE_TABLET | Freq: Every day | ORAL | Status: DC

## 2018-02-03 MED ORDER — INSULIN ASPART 100 UNIT/ML ~~LOC~~ SOLN
0.0000 [IU] | Freq: Three times a day (TID) | SUBCUTANEOUS | Status: DC
  Administered 2018-02-03: 20 [IU] via SUBCUTANEOUS
  Administered 2018-02-03: 11 [IU] via SUBCUTANEOUS
  Administered 2018-02-04: 4 [IU] via SUBCUTANEOUS
  Administered 2018-02-04 – 2018-02-05 (×3): 7 [IU] via SUBCUTANEOUS
  Administered 2018-02-05: 4 [IU] via SUBCUTANEOUS
  Filled 2018-02-03: qty 1

## 2018-02-03 MED ORDER — INSULIN ASPART 100 UNIT/ML ~~LOC~~ SOLN
0.0000 [IU] | Freq: Every day | SUBCUTANEOUS | Status: DC
Start: 2018-02-03 — End: 2018-02-05
  Administered 2018-02-04: 2 [IU] via SUBCUTANEOUS

## 2018-02-03 MED ORDER — SODIUM CHLORIDE 0.9 % WEIGHT BASED INFUSION: 1.0000 mL/kg/h | INTRAVENOUS | Status: AC

## 2018-02-03 MED ORDER — VERAPAMIL HCL 2.5 MG/ML IV SOLN
INTRAVENOUS | Status: AC
Start: 2018-02-03 — End: ?
  Filled 2018-02-03: qty 2

## 2018-02-03 MED ORDER — HEPARIN SODIUM (PORCINE) 5000 UNIT/ML IJ SOLN
2000.0000 [IU] | Freq: Once | INTRAMUSCULAR | Status: AC
Start: 2018-02-03 — End: 2018-02-03
  Administered 2018-02-03: 2000 [IU] via INTRAVENOUS

## 2018-02-03 SURGICAL SUPPLY — 12 items
CATH INFINITI 5 FR JL3.5 (CATHETERS) ×1 IMPLANT
CATH INFINITI 5FR JL4 (CATHETERS) IMPLANT
CATH INFINITI JR4 5F (CATHETERS) ×1 IMPLANT
DEVICE RAD COMP TR BAND LRG (VASCULAR PRODUCTS) ×1 IMPLANT
GLIDESHEATH SLEND A-KIT 6F 22G (SHEATH) ×1 IMPLANT
GUIDEWIRE INQWIRE 1.5J.035X260 (WIRE) IMPLANT
INQWIRE 1.5J .035X260CM (WIRE) ×2
KIT HEART LEFT (KITS) ×2 IMPLANT
PACK CARDIAC CATHETERIZATION (CUSTOM PROCEDURE TRAY) ×2 IMPLANT
SHEATH PINNACLE 5F 10CM (SHEATH) ×1 IMPLANT
TRANSDUCER W/STOPCOCK (MISCELLANEOUS) ×2 IMPLANT
TUBING CIL FLEX 10 FLL-RA (TUBING) ×2 IMPLANT

## 2018-02-03 NOTE — H&P (Signed)
CARDIOLOGY H&P  HPI:  Danielle Vargas is a 67 y.o. female w/ history of HFrEF (25%), CAD s/p prior STEMI and PCI (most recently 2018), IDDM, OSA on CPAP, CVA, HTN, and obesity who presents with concern for STEMI.   Patient developed acute onset central chest and R arm pain approximately 2 hours prior to ED admission. Was sitting resting at the time. Same symptoms that she had during her prior MI's. Pain was non-radiating and with no associated symptoms. Had been taking all of her medications as prescribed. Has not had any chest pain since her prior MI in 2018. Pain did not improve so she was brought to ED for workup.   Initial ECG by EMS concerning for STEMI w/ ST elevation in I and aVL. Brought to ED as code STEMI. Was given 3 doses of SL nitro and pain symptoms completely resolved. On arrival to ED, patient had no complaints. ECG revealed <1 mm ST elevation in I and aVL with nonspecific changes throughout and Q waves in the inferior limb leads and throughout the precordium. She was given ASA 324mg  and started on a heparin drip.   Review of Systems:     Cardiac Review of Systems: {Y] = yes [ ]  = no  Chest Pain [  Y  ]  Resting SOB [   ] Exertional SOB  [  ]  Orthopnea [  ]   Pedal Edema [   ]    Palpitations [  ] Syncope  [  ]   Presyncope [   ]  General Review of Systems: [Y] = yes [  ]=no Constitional: recent weight change [  ]; anorexia [  ]; fatigue [  ]; nausea [  ]; night sweats [  ]; fever [  ]; or chills [  ];                                                                     Dental: poor dentition[  ];   Eye : blurred vision [  ]; diplopia [   ]; vision changes [  ];  Amaurosis fugax[  ]; Resp: cough [  ];  wheezing[  ];  hemoptysis[  ]; shortness of breath[  ]; paroxysmal nocturnal dyspnea[  ]; dyspnea on exertion[  ]; or orthopnea[  ];  GI:  gallstones[  ], vomiting[  ];  dysphagia[  ]; melena[  ];  hematochezia [  ]; heartburn[  ];   GU: kidney stones [  ]; hematuria[  ];    dysuria [  ];  nocturia[  ];               Skin: rash [  ], swelling[  ];, hair loss[  ];  peripheral edema[  ];  or itching[  ]; Musculosketetal: myalgias[  ];  joint swelling[  ];  joint erythema[  ];  joint pain[  ];  back pain[  ];  Heme/Lymph: bruising[  ];  bleeding[  ];  anemia[  ];  Neuro: TIA[  ];  headaches[  ];  stroke[  ];  vertigo[  ];  seizures[  ];   paresthesias[  ];  difficulty walking[  ];  Psych:depression[  ]; anxiety[  ];  Endocrine:  diabetes[  ];  thyroid dysfunction[  ];  Other:  Past Medical History:  Diagnosis Date  . Acute combined systolic and diastolic heart failure ([place]) 11/19/2016  . Allergy   . Asthma   . CAD (coronary artery disease)    a. s/p lateral STEMI in 2015 2ry to occlusion of OM1 thought to be caused by coronary artery dissection, treated with POBA. b. 11/2016: STEMI with thrombotic occlusion of distal LCx which was treated with balloon angioplasty.   . Cataract    "just beginning"  . Colon polyps 7/07   hyperplastic and adenomatous  . Depression    pt unsure, psychologist said no  . Diverticulosis   . DM (diabetes mellitus) ([place])   . Dyslipidemia   . External hemorrhoid   . Fatty liver   . Gastroparesis   . HTN (hypertension)   . Hyperlipidemia   . 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 Hypothyroidism   . Migraine   . Myocardial infarction ([place])    FEb 2015  . Nephrolithiasis   . Obesity   . Osteoporosis   . Sleep apnea    wears CPAP  . ST elevation myocardial infarction (STEMI) of inferolateral wall ([place]) 11/19/2016  . Stroke [facility])     Prior to Admission medications   Medication Sig Start Date End Date Taking? Authorizing Provider  acetaminophen (TYLENOL) 325 MG tablet Take 650 mg by mouth every 6 (six) hours as needed (pain).    [provider]  albuterol (PROVENTIL HFA;VENTOLIN HFA) 108 (90 BASE) MCG/ACT inhaler Inhale 2 puffs into the lungs every 6 (six) hours as needed for wheezing or shortness of breath.      [provider]  aspirin EC 81 MG EC tablet Take 1 tablet (81 mg total) by mouth daily. 11/26/16   Strader, Fransisco Hertz, PA-C  budesonide-formoterol (SYMBICORT) 160-4.5 MCG/ACT inhaler Inhale 2 puffs into the lungs 2 (two) times daily.    [provider]  carvedilol (COREG) 3.125 MG tablet Take 1 tablet by mouth 2 (two) times daily. 09/15/16   [provider]  docusate sodium (COLACE) 100 MG capsule Take 100 mg by mouth 2 (two) times daily.    [provider]  fenofibrate 160 MG tablet Take 160 mg by mouth at bedtime.     [provider]  furosemide (LASIX) 40 MG tablet Take 40 mg by mouth every morning.    [provider]  gabapentin (NEURONTIN) 300 MG capsule Take 300 mg by mouth 3 (three) times daily.    [provider]  insulin glargine (LANTUS) 100 UNIT/ML injection Inject 50 units in am and 45 units at bedtime. 06/23/17   Philemon Kingdom, MD  insulin regular (NOVOLIN R,HUMULIN R) 100 units/mL injection Inject 50 units into the skin at breakfast, 55 units into the skin at lunch, and 45 units into the skin at dinner.    [provider]  isosorbide mononitrate (IMDUR) 30 MG 24 hr tablet Take 30 mg by mouth daily.    [provider]  levothyroxine (SYNTHROID, LEVOTHROID) 100 MCG tablet Take 1 tablet (100 mcg total) by mouth daily before breakfast. 04/01/16   Love, Ivan Anchors, PA-C  metoCLOPramide (REGLAN) 10 MG tablet Take 10 mg by mouth 4 (four) times daily.     [provider]  mometasone (NASONEX) 50 MCG/ACT nasal spray Place 2 sprays into the nose daily. 08/30/15   Dohmeier, Asencion Partridge, MD  montelukast (SINGULAIR) 10 MG tablet Take 10 mg by mouth at bedtime.  [provider]  Multiple Vitamin (MULTIVITAMIN WITH MINERALS) TABS tablet Take 1 tablet by mouth daily. One A Day 50+    [provider]  nitroGLYCERIN (NITROSTAT) 0.4 MG SL tablet Place 1 tablet (0.4 mg total) under the tongue every 5  (five) minutes as needed for chest pain. 10/29/13   Barrett, Evelene Croon, PA-C  OXYGEN Inhale 1 L into the lungs continuous.    [provider]  pantoprazole (PROTONIX) 40 MG tablet Take 1 tablet (40 mg total) by mouth daily. 04/01/16   Love, Ivan Anchors, PA-C  polyethylene glycol (MIRALAX / GLYCOLAX) packet Take 17 g by mouth every other day.     [provider]  rosuvastatin (CRESTOR) 20 MG tablet Take 20 mg by mouth at bedtime.    [provider]  terbinafine (LAMISIL) 250 MG tablet Take 250 mg by mouth daily. STOP 07-12-17    [provider]  ticagrelor (BRILINTA) 90 MG TABS tablet Take 1 tablet (90 mg total) by mouth 2 (two) times daily. 11/25/16   Strader, Fransisco Hertz, PA-C  tiZANidine (ZANAFLEX) 2 MG tablet Take 2 mg by mouth 3 (three) times daily.  10/26/15   [provider]  topiramate (TOPAMAX) 50 MG tablet Take 50 mg by mouth 2 (two) times daily.    [provider]      Allergies  Allergen Reactions  . Invokana [Canagliflozin] Diarrhea  . Nsaids Other (See Comments)    GI problems/ stomach pain  . Penicillins Rash    Has patient had a PCN reaction causing immediate rash, facial/tongue/throat swelling, SOB or lightheadedness with hypotension: No (rash on legs after 3 days [place] -June 2016) Has patient had a PCN reaction causing severe rash involving mucus membranes or skin necrosis: No Has patient had a PCN reaction that required hospitalization No Has patient had a PCN reaction occurring within the last 10 years: Yes If all of the above answers are "NO", then may proceed with Cephalosporin use.    Social History   Socioeconomic History  . Marital status: Divorced    Spouse name: Not on file  . Number of children: 2  . Years of education: Not on file  . Highest education level: Not on file  Occupational History  . Occupation: disabled    Fish farm manager: UNEMPLOYED  Social Needs  . Financial resource strain: Not on file  . Food  insecurity:    Worry: Not on file    Inability: Not on file  . Transportation needs:    Medical: Not on file    Non-medical: Not on file  Tobacco Use  . Smoking status: Never Smoker  . Smokeless tobacco: Never Used  Substance and Sexual Activity  . Alcohol use: No  . Drug use: No  . Sexual activity: Not Currently    Birth control/protection: Post-menopausal  Lifestyle  . Physical activity:    Days per week: Not on file    Minutes per session: Not on file  . Stress: Not on file  Relationships  . Social connections:    Talks on phone: Not on file    Gets together: Not on file    Attends religious service: Not on file    Active member of club or organization: Not on file    Attends meetings of clubs or organizations: Not on file    Relationship status: Not on file  . Intimate partner violence:    Fear of current or ex partner: Not on file  Emotionally abused: Not on file    Physically abused: Not on file    Forced sexual activity: Not on file  Other Topics Concern  . Not on file  Social History Narrative   Lives in [facility].  Retired.  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   Regular exercise: none   Caffeine use: daily; dt coke          Family History  Problem Relation Age of Onset  . COPD Father   . Stroke Father   . Emphysema Father   . Heart failure Mother   . Heart attack Mother        several  . Diabetes Mother   . Kidney disease Mother   . Heart failure Brother   . Hypertension Brother   . Colon cancer Unknown 45       mat. 1st cousin  . Other Unknown        celiac sprue, 1/2 brother  . Diabetes Brother        x 3  . Lung cancer Brother   . Cirrhosis Sister        liver transplant  . Liver disease Sister        transplant  . Heart attack Paternal Grandfather   . Heart attack Brother   . Colon cancer Paternal Aunt 52  . Esophageal cancer Neg Hx   . Stomach cancer Neg Hx   . Rectal cancer Neg Hx     PHYSICAL  EXAM: Vitals:   02/03/18 0157 02/03/18 0200  BP: 117/76 110/62  Pulse: 98 96  Resp: 17 (!) 23  Temp:    SpO2: 98% 98%   General:  Obese, NAD HEENT: obese Neck: supple. Unable to detect JVP Cor: PMI nondisplaced. Regular rate & rhythm. Very distant heart sounds. No appreciable r/m/g.  Lungs: clear, decreased air movement throughout Abdomen: soft, obese, nontender, nondistended. Good bowel sounds. Extremities: 2+ pitting in the LLE to the knee, no edema in the RLE  Neuro: alert & oriented x 3, cranial nerves grossly intact. Affect pleasant. Residual L sided motor deficits from prior CVA  ECG: NSR, sub-mm ST elevation in I and aVL with nonspecific ST changes throughough; inferior and anterolateral Q waves  Results for orders placed or performed during the hospital encounter of 02/03/18 (from the past 24 hour(s))  CBC with Differential/Platelet     Status: None   Collection Time: 02/03/18 12:50 AM  Result Value Ref Range   WBC 6.4 4.0 - 10.5 K/uL   RBC 4.28 3.87 - 5.11 MIL/uL   Hemoglobin 13.2 12.0 - 15.0 g/dL   HCT 40.1 36.0 - 46.0 %   MCV 93.7 78.0 - 100.0 fL   MCH 30.8 26.0 - 34.0 pg   MCHC 32.9 30.0 - 36.0 g/dL   RDW 14.3 11.5 - 15.5 %   Platelets 244 150 - 400 K/uL   Neutrophils Relative % 58 %   Neutro Abs 3.8 1.7 - 7.7 K/uL   Lymphocytes Relative 28 %   Lymphs Abs 1.8 0.7 - 4.0 K/uL   Monocytes Relative 8 %   Monocytes Absolute 0.5 0.1 - 1.0 K/uL   Eosinophils Relative 4 %   Eosinophils Absolute 0.2 0.0 - 0.7 K/uL   Basophils Relative 1 %   Basophils Absolute 0.1 0.0 - 0.1 K/uL   Immature Granulocytes 1 %   Abs Immature Granulocytes 0.0 0.0 - 0.1 K/uL  Protime-INR     Status: Abnormal  Collection Time: 02/03/18 12:50 AM  Result Value Ref Range   Prothrombin Time 15.9 (H) 11.4 - 15.2 seconds   INR 1.28   APTT     Status: Abnormal   Collection Time: 02/03/18 12:50 AM  Result Value Ref Range   aPTT 151 (H) 24 - 36 seconds  Comprehensive metabolic panel      Status: Abnormal   Collection Time: 02/03/18 12:50 AM  Result Value Ref Range   Sodium 140 135 - 145 mmol/L   Potassium 3.9 3.5 - 5.1 mmol/L   Chloride 104 101 - 111 mmol/L   CO2 22 22 - 32 mmol/L   Glucose, Bld 365 (H) 65 - 99 mg/dL   BUN 17 6 - 20 mg/dL   Creatinine, Ser 1.27 (H) 0.44 - 1.00 mg/dL   Calcium 9.4 8.9 - 10.3 mg/dL   Total Protein 7.2 6.5 - 8.1 g/dL   Albumin 3.4 (L) 3.5 - 5.0 g/dL   AST 69 (H) 15 - 41 U/L   ALT 44 14 - 54 U/L   Alkaline Phosphatase 54 38 - 126 U/L   Total Bilirubin 0.8 0.3 - 1.2 mg/dL   GFR calc non Af Amer 43 (L) >60 mL/min   GFR calc Af Amer 50 (L) >60 mL/min   Anion gap 14 5 - 15  Troponin I     Status: Abnormal   Collection Time: 02/03/18 12:50 AM  Result Value Ref Range   Troponin I 0.34 (HH) <0.03 ng/mL  Lipid panel     Status: Abnormal   Collection Time: 02/03/18 12:50 AM  Result Value Ref Range   Cholesterol 112 0 - 200 mg/dL   Triglycerides 263 (H) <150 mg/dL   HDL 19 (L) >40 mg/dL   Total CHOL/HDL Ratio 5.9 RATIO   VLDL 53 (H) 0 - 40 mg/dL   LDL Cholesterol 40 0 - 99 mg/dL  I-stat troponin, ED     Status: Abnormal   Collection Time: 02/03/18 12:53 AM  Result Value Ref Range   Troponin i, poc 0.54 (HH) 0.00 - 0.08 ng/mL   Comment NOTIFIED PHYSICIAN    Comment 3           Dg Chest Portable 1 View  Result Date: 02/03/2018 CLINICAL DATA:  STEMI EXAM: PORTABLE CHEST 1 VIEW COMPARISON:  11/28/2016 FINDINGS: Lungs are clear.  No pleural effusion or pneumothorax. Mild cardiomegaly. Loop recorder overlying the left hemithorax. IMPRESSION: No evidence of acute cardiopulmonary disease. Electronically Signed   By: Julian Hy M.D.   On: 02/03/2018 01:00     ASSESSMENT: Tuyet Bader is a 67 y.o. female w/ history of HFpEF (25% in 2018, improved to 65% several months later), CAD s/p prior STEMI and PCI (most recently 2018), IDDM, OSA on CPAP, CVA, HTN, and obesity who presents with concern for STEMI. ECG on presentation not meeting  criteria for STEMI and patient chest pain free with SL nitro. Troponin elevated, indicating NSTEMI.   PLAN/DISCUSSION: #) NSTEMI: plan for cath in AM - repeat troponin q6h x 2 - NPO for cath in AM - check lipids, A1c - reorder echo - ASA 324mg  then 81mg  daily - heparin drip for ACS per pharmacy protocol - cont home rosuvastatin 20mg  QHS - defer ACE for now given renal function - SLN, nitro gtt PRN - reload ticagrelor 180mg , then cont home dose 90mg  BID  #) HFpEF: ischemic in origin; no signs of acute decompensated heart failure. LVEF 25% in 11/2016, improved to normal in 02/2017.  -  cont home lasix 40mg  daily - cont home imdur 30mg  daily and carvedilol 3.25mg  BID  #) IDDM - cont home lantus 50u QAM and 45u QPM - cont home regular insulin 70u before breakfast, 70u before lunch, 55u before dinner  #) OSA - CPAP while in hospital  Marcie Mowers, MD Cardiology Fellow, PGY-5

## 2018-02-03 NOTE — Progress Notes (Signed)
ANTICOAGULATION CONSULT NOTE - Initial Consult  Pharmacy Consult for heparin Indication: chest pain/ACS  Allergies  Allergen Reactions  . Invokana [Canagliflozin] Diarrhea  . Nsaids Other (See Comments)    GI problems/ stomach pain  . Penicillins Rash    Has patient had a PCN reaction causing immediate rash, facial/tongue/throat swelling, SOB or lightheadedness with hypotension: No (rash on legs after 3 days [place] -June 2016) Has patient had a PCN reaction causing severe rash involving mucus membranes or skin necrosis: No Has patient had a PCN reaction that required hospitalization No Has patient had a PCN reaction occurring within the last 10 years: Yes If all of the above answers are "NO", then may proceed with Cephalosporin use.    Patient Measurements: Height: 5\' 1"  (154.9 cm) Weight: 208 lb (94.3 kg) IBW/kg (Calculated) : 47.8 Heparin Dosing Weight: 70 kg  Vital Signs: Temp: 96.5 F (35.8 C) (05/29 0039) Temp Source: Temporal (05/29 0039) BP: 152/91 (05/29 0039) Pulse Rate: 112 (05/29 0039)  Labs: No results for input(s): HGB, HCT, PLT, APTT, LABPROT, INR, HEPARINUNFRC, HEPRLOWMOCWT, CREATININE, CKTOTAL, CKMB, TROPONINI in the last 72 hours.  CrCl cannot be calculated (Patient's most recent lab result is older than the maximum 21 days allowed.).   Medical History: Past Medical History:  Diagnosis Date  . Acute combined systolic and diastolic heart failure ([place]) 11/19/2016  . Allergy   . Asthma   . CAD (coronary artery disease)    a. s/p lateral STEMI in 2015 2ry to occlusion of OM1 thought to be caused by coronary artery dissection, treated with POBA. b. 11/2016: STEMI with thrombotic occlusion of distal LCx which was treated with balloon angioplasty.   . Cataract    "just beginning"  . Colon polyps 7/07   hyperplastic and adenomatous  . Depression    pt unsure, psychologist said no  . Diverticulosis   . DM (diabetes mellitus) ([place])   . Dyslipidemia   . External  hemorrhoid   . Fatty liver   . Gastroparesis   . HTN (hypertension)   . Hyperlipidemia   . 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 Hypothyroidism   . Migraine   . Myocardial infarction ([place])    FEb 2015  . Nephrolithiasis   . Obesity   . Osteoporosis   . Sleep apnea    wears CPAP  . ST elevation myocardial infarction (STEMI) of inferolateral wall ([place]) 11/19/2016  . Stroke [facility])     Medications:  See medication history  Assessment: 67 yo lady to start heparin for CP.  She was not on anticoagulation PTA Goal of Therapy:  Heparin level 0.3-0.7 units/ml Monitor platelets by anticoagulation protocol: Yes   Plan:  Heparin 4000 bolus and drip at 1000 units/hr Check heparin level 6 hours after start Daily heparin level and CBC while on heparin Monitor for bleeding complications  Tishana Clinkenbeard [place] 02/03/2018,12:46 AM

## 2018-02-03 NOTE — Interval H&P Note (Signed)
Cath Lab Visit (complete for each Cath Lab visit)  Clinical Evaluation Leading to the Procedure:   ACS: Yes.    Non-ACS:    Anginal Classification: CCS III  Anti-ischemic medical therapy: Minimal Therapy (1 class of medications)  Non-Invasive Test Results: No non-invasive testing performed  Prior CABG: No previous CABG      History and Physical Interval Note:  02/03/2018 10:10 AM  Danielle Vargas  has presented today for surgery, with the diagnosis of cp  The various methods of treatment have been discussed with the patient and family. After consideration of risks, benefits and other options for treatment, the patient has consented to  Procedure(s): LEFT HEART CATH AND CORONARY ANGIOGRAPHY (N/A) as a surgical intervention .  The patient's history has been reviewed, patient examined, no change in status, stable for surgery.  I have reviewed the patient's chart and labs.  Questions were answered to the patient's satisfaction.     Belva Crome III

## 2018-02-03 NOTE — ED Triage Notes (Signed)
Pt arrived from [place] facility for c/o chest pain radiating to her right arm. Denies nausea or SOB. Hx of MI and Stroke.

## 2018-02-03 NOTE — ED Notes (Signed)
Paged cards to [place], [place]

## 2018-02-03 NOTE — Progress Notes (Signed)
Paged on-call cardiology fellow to clarify order to d/c nitroglycerin gtt and start heparin gtt. Ordered to d/c nitroglycerin gtt d/t patient not having chest pain and to not start heparin gtt.

## 2018-02-03 NOTE — ED Notes (Signed)
Cardiology at bedside with patient for assessment.

## 2018-02-03 NOTE — ED Notes (Signed)
Pt developed right substernal chest pain with radiation to R arm. Similar to pain she had earlier. Cardiology paged, repeat EKG obtained and given to EDP. Cardiology spoke with EDP and states to try sublingual nitro first and then start Nitro drip.

## 2018-02-03 NOTE — Plan of Care (Signed)
  Problem: Clinical Measurements: Goal: Ability to maintain clinical measurements within normal limits will improve Outcome: Progressing Goal: Respiratory complications will improve Outcome: Progressing   Problem: Nutrition: Goal: Adequate nutrition will be maintained Outcome: Progressing   Problem: Elimination: Goal: Will not experience complications related to urinary retention Outcome: Progressing   Problem: Skin Integrity: Goal: Risk for impaired skin integrity will decrease Outcome: Not Progressing

## 2018-02-03 NOTE — ED Triage Notes (Signed)
Started at 2315 and resolved at [place]. Given 324ASA and 3 nitro with EMS PTA BP 112/76 98%RA P108 [id number]

## 2018-02-03 NOTE — ED Notes (Signed)
Secretary paged admitting to update on pt.'s elevated Troponin result.Danielle Vargas

## 2018-02-03 NOTE — Progress Notes (Signed)
Set pt up on nasal cpap for night time rest with 2 lpm oxygen

## 2018-02-03 NOTE — Progress Notes (Signed)
[place] for heparin Indication: chest pain/ACS  Allergies  Allergen Reactions  . Invokana [Canagliflozin] Diarrhea  . Nsaids Other (See Comments)    GI problems/ stomach pain  . Penicillins Rash    Has patient had a PCN reaction causing immediate rash, facial/tongue/throat swelling, SOB or lightheadedness with hypotension: No (rash on legs after 3 days [place] -June 2016) Has patient had a PCN reaction causing severe rash involving mucus membranes or skin necrosis: No Has patient had a PCN reaction that required hospitalization No Has patient had a PCN reaction occurring within the last 10 years: Yes If all of the above answers are "NO", then may proceed with Cephalosporin use.    Patient Measurements: Height: 5\' 1"  (154.9 cm) Weight: 208 lb (94.3 kg) IBW/kg (Calculated) : 47.8 Heparin Dosing Weight: 70 kg  Vital Signs: Temp: 96.5 F (35.8 C) (05/29 0039) Temp Source: Temporal (05/29 0039) BP: 134/66 (05/29 0641) Pulse Rate: 90 (05/29 0641)  Labs: Recent Labs    02/03/18 0050 02/03/18 0410 02/03/18 0726  HGB 13.2 12.7  --   HCT 40.1 39.7  --   PLT 244 217  --   APTT 151*  --   --   LABPROT 15.9*  --   --   INR 1.28  --   --   HEPARINUNFRC  --   --  0.19*  CREATININE 1.27* 1.20*  --   TROPONINI 0.34*  --   --     Estimated Creatinine Clearance: 49 mL/min (A) (by C-G formula based on SCr of 1.2 mg/dL (H)).   Medical History: Past Medical History:  Diagnosis Date  . Acute combined systolic and diastolic heart failure ([place]) 11/19/2016  . Allergy   . Asthma   . CAD (coronary artery disease)    a. s/p lateral STEMI in 2015 2ry to occlusion of OM1 thought to be caused by coronary artery dissection, treated with POBA. b. 11/2016: STEMI with thrombotic occlusion of distal LCx which was treated with balloon angioplasty.   . Cataract    "just beginning"  . Colon polyps 7/07   hyperplastic and adenomatous  . Depression    pt  unsure, psychologist said no  . Diverticulosis   . DM (diabetes mellitus) ([place])   . Dyslipidemia   . External hemorrhoid   . Fatty liver   . Gastroparesis   . HTN (hypertension)   . Hyperlipidemia   . 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 Hypothyroidism   . Migraine   . Myocardial infarction ([place])    FEb 2015  . Nephrolithiasis   . Obesity   . Osteoporosis   . Sleep apnea    wears CPAP  . ST elevation myocardial infarction (STEMI) of inferolateral wall ([place]) 11/19/2016  . Stroke [facility])     Medications:  See medication history  Assessment: 67 yo lady to start heparin for CP.  She was not on anticoagulation PTA Goal of Therapy:  Heparin level 0.3-0.7 units/ml Monitor platelets by anticoagulation protocol: Yes   Plan:  Heparin 2000 bolus and increase the drip to 1200 units/hr Check heparin level 6 hours after start Daily heparin level and CBC while on heparin Monitor for bleeding complications  Corinda Gubler 02/03/2018,8:08 AM

## 2018-02-03 NOTE — ED Provider Notes (Addendum)
[facility] EMERGENCY DEPARTMENT Provider Note   CSN: [id number] Arrival date & time: 02/03/18  0030     History   Chief Complaint Chief Complaint  Patient presents with  . Code STEMI    HPI Danielle Vargas is a 67 y.o. female.  The history is provided by the patient.  Chest Pain   This is a recurrent problem. The current episode started 1 to 2 hours ago. The problem occurs constantly. The problem has been resolved. The pain is associated with rest. The pain is present in the lateral region. The pain is at a severity of 6/10. The pain is moderate. The quality of the pain is described as dull. The pain radiates to the right arm and right shoulder. Duration of episode(s) is 1 hour. Exacerbated by: nothing. Pertinent negatives include no cough, no diaphoresis, no hemoptysis, no leg pain, no nausea and no vomiting. She has tried nitroglycerin for the symptoms. The treatment provided significant relief. Risk factors include being elderly and obesity.  Her past medical history is significant for MI.  Pertinent negatives for family medical history include: no early MI.  Procedure history is positive for cardiac catheterization.    Past Medical History:  Diagnosis Date  . Acute combined systolic and diastolic heart failure ([place]) 11/19/2016  . Allergy   . Asthma   . CAD (coronary artery disease)    a. s/p lateral STEMI in 2015 2ry to occlusion of OM1 thought to be caused by coronary artery dissection, treated with POBA. b. 11/2016: STEMI with thrombotic occlusion of distal LCx which was treated with balloon angioplasty.   . Cataract    "just beginning"  . Colon polyps 7/07   hyperplastic and adenomatous  . Depression    pt unsure, psychologist said no  . Diverticulosis   . DM (diabetes mellitus) ([place])   . Dyslipidemia   . External hemorrhoid   . Fatty liver   . Gastroparesis   . HTN (hypertension)   . Hyperlipidemia   . 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  Hypothyroidism   . Migraine   . Myocardial infarction ([place])    FEb 2015  . Nephrolithiasis   . Obesity   . Osteoporosis   . Sleep apnea    wears CPAP  . ST elevation myocardial infarction (STEMI) of inferolateral wall ([place]) 11/19/2016  . Stroke [facility])     Patient Active Problem List   Diagnosis Date Noted  . Pre-syncope 11/28/2016  . Chronic combined systolic and diastolic heart failure ([place]) 11/19/2016  . ST elevation myocardial infarction (STEMI) of inferolateral wall ([place]) 11/19/2016  . 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 Left spastic hemiparesis ([place]) 07/15/2016  . Bursitis of right hip 04/01/2016  . Vascular headache   . 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 Asthma   . Benign essential HTN   . S/P rotator cuff repair   . Acute right MCA stroke ([place])   . Back pain   . Ischemic stroke ([place]) 03/10/2016  . Stroke (cerebrum) ([place]) 03/10/2016  . Hypothyroidism   . 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 UTI (urinary tract infection) 06/16/2015  . Family hx of colon cancer 04/17/2015  . OSA on CPAP 12/08/2014  . Retinopathy due to secondary diabetes mellitus, without macular edema, with moderate nonproliferative retinopathy ([place]) 09/18/2014  . CAD in native artery 01/23/2014  . Morbid obesity ([place]) 01/23/2014  .  Renal insufficiency 11/17/2013  . HTN (hypertension) 10/29/2013  . Chronic diastolic heart failure ([place]) 10/29/2013  . 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 LADA (latent autoimmune diabetes in adults), managed as type 1 ([place]) 08/08/2013    Past Surgical History:  Procedure Laterality Date  . BREAST REDUCTION SURGERY  '88  . CARPAL TUNNEL RELEASE  2000   right wrist  . CERVICAL DISC SURGERY  '98   fusion  . CESAREAN SECTION  '79  '84  . COLONOSCOPY    . CORONARY BALLOON ANGIOPLASTY N/A 11/19/2016   Procedure: Coronary Balloon  Angioplasty;  Surgeon: Sherren Mocha, MD;  Location: [facility] CV LAB;  Service: Cardiovascular;  Laterality: N/A;  . EP IMPLANTABLE DEVICE N/A 03/12/2016   Procedure: Loop Recorder Insertion;  Surgeon: Will Meredith Leeds, MD;  Location: [facility] CV LAB;  Service: Cardiovascular;  Laterality: N/A;  . LEFT HEART CATH AND CORONARY ANGIOGRAPHY N/A 11/19/2016   Procedure: Left Heart Cath and Coronary Angiography;  Surgeon: Sherren Mocha, MD;  Location: [facility] CV LAB;  Service: Cardiovascular;  Laterality: N/A;  . LEFT HEART CATHETERIZATION WITH CORONARY ANGIOGRAM N/A 10/26/2013   Procedure: LEFT HEART CATHETERIZATION WITH CORONARY ANGIOGRAM;  Surgeon: Sinclair Grooms, MD;  Location: [facility] CATH LAB;  Service: Cardiovascular;  Laterality: N/A;  . ROTATOR CUFF REPAIR  '09   left shoulder  . TEE WITHOUT CARDIOVERSION N/A 03/12/2016   Procedure: TRANSESOPHAGEAL ECHOCARDIOGRAM (TEE);  Surgeon: Sanda Klein, MD;  Location: [facility] ENDOSCOPY;  Service: Cardiovascular;  Laterality: N/A;  . TUBAL LIGATION  '85  . ULNAR NERVE REPAIR  '02, '08   left done, then right     OB History   None      Home Medications    Prior to Admission medications   Medication Sig Start Date End Date Taking? Authorizing Provider  acetaminophen (TYLENOL) 325 MG tablet Take 650 mg by mouth every 6 (six) hours as needed (pain).    [provider]  albuterol (PROVENTIL HFA;VENTOLIN HFA) 108 (90 BASE) MCG/ACT inhaler Inhale 2 puffs into the lungs every 6 (six) hours as needed for wheezing or shortness of breath.     [provider]  aspirin EC 81 MG EC tablet Take 1 tablet (81 mg total) by mouth daily. 11/26/16   Strader, Fransisco Hertz, PA-C  budesonide-formoterol (SYMBICORT) 160-4.5 MCG/ACT inhaler Inhale 2 puffs into the lungs 2 (two) times daily.    [provider]  carvedilol (COREG) 3.125 MG tablet Take 1 tablet by mouth 2 (two) times daily. 09/15/16   [provider]  docusate sodium  (COLACE) 100 MG capsule Take 100 mg by mouth 2 (two) times daily.    [provider]  fenofibrate 160 MG tablet Take 160 mg by mouth at bedtime.     [provider]  furosemide (LASIX) 40 MG tablet Take 40 mg by mouth every morning.    [provider]  gabapentin (NEURONTIN) 300 MG capsule Take 300 mg by mouth 3 (three) times daily.    [provider]  insulin glargine (LANTUS) 100 UNIT/ML injection Inject 50 units in am and 45 units at bedtime. 06/23/17   Philemon Kingdom, MD  insulin regular (NOVOLIN R,HUMULIN R) 100 units/mL injection Inject 50 units into the skin at breakfast, 55 units into the skin at lunch, and 45 units into the skin at dinner.    [provider]  isosorbide mononitrate (IMDUR) 30 MG 24 hr tablet Take 30 mg by mouth daily.  [provider]  levothyroxine (SYNTHROID, LEVOTHROID) 100 MCG tablet Take 1 tablet (100 mcg total) by mouth daily before breakfast. 04/01/16   Love, Ivan Anchors, PA-C  metoCLOPramide (REGLAN) 10 MG tablet Take 10 mg by mouth 4 (four) times daily.     [provider]  mometasone (NASONEX) 50 MCG/ACT nasal spray Place 2 sprays into the nose daily. 08/30/15   Dohmeier, Asencion Partridge, MD  montelukast (SINGULAIR) 10 MG tablet Take 10 mg by mouth at bedtime.    [provider]  Multiple Vitamin (MULTIVITAMIN WITH MINERALS) TABS tablet Take 1 tablet by mouth daily. One A Day 50+    [provider]  nitroGLYCERIN (NITROSTAT) 0.4 MG SL tablet Place 1 tablet (0.4 mg total) under the tongue every 5 (five) minutes as needed for chest pain. 10/29/13   Barrett, Evelene Croon, PA-C  OXYGEN Inhale 1 L into the lungs continuous.    [provider]  pantoprazole (PROTONIX) 40 MG tablet Take 1 tablet (40 mg total) by mouth daily. 04/01/16   Love, Ivan Anchors, PA-C  polyethylene glycol (MIRALAX / GLYCOLAX) packet Take 17 g by mouth every other day.     [provider]  rosuvastatin (CRESTOR) 20 MG  tablet Take 20 mg by mouth at bedtime.    [provider]  terbinafine (LAMISIL) 250 MG tablet Take 250 mg by mouth daily. STOP 07-12-17    [provider]  ticagrelor (BRILINTA) 90 MG TABS tablet Take 1 tablet (90 mg total) by mouth 2 (two) times daily. 11/25/16   Strader, Fransisco Hertz, PA-C  tiZANidine (ZANAFLEX) 2 MG tablet Take 2 mg by mouth 3 (three) times daily.  10/26/15   [provider]  topiramate (TOPAMAX) 50 MG tablet Take 50 mg by mouth 2 (two) times daily.    [provider]    Family History Family History  Problem Relation Age of Onset  . COPD Father   . Stroke Father   . Emphysema Father   . Heart failure Mother   . Heart attack Mother        several  . Diabetes Mother   . Kidney disease Mother   . Heart failure Brother   . Hypertension Brother   . Colon cancer Unknown 45       mat. 1st cousin  . Other Unknown        celiac sprue, 1/2 brother  . Diabetes Brother        x 3  . Lung cancer Brother   . Cirrhosis Sister        liver transplant  . Liver disease Sister        transplant  . Heart attack Paternal Grandfather   . Heart attack Brother   . Colon cancer Paternal Aunt 22  . Esophageal cancer Neg Hx   . Stomach cancer Neg Hx   . Rectal cancer Neg Hx     Social History Social History   Tobacco Use  . Smoking status: Never Smoker  . Smokeless tobacco: Never Used  Substance Use Topics  . Alcohol use: No  . Drug use: No     Allergies   Invokana [canagliflozin]; Nsaids; and Penicillins   Review of Systems Review of Systems  Constitutional: Negative for diaphoresis.  Respiratory: Negative for cough, hemoptysis and wheezing.   Cardiovascular: Positive for chest pain.  Gastrointestinal: Negative for nausea and vomiting.  Musculoskeletal: Negative for neck pain.  All other systems reviewed and are negative.    Physical  Exam Updated Vital Signs BP 138/69   Pulse 99   Temp (!) 96.5 F (35.8 C) (Temporal)    Resp (!) 23   Ht 5\' 1"  (1.549 m)   Wt 94.3 kg (208 lb)   SpO2 98%   BMI 39.30 kg/m   Physical Exam  Constitutional: She is oriented to person, place, and time. She appears well-developed and well-nourished.  HENT:  Head: Normocephalic and atraumatic.  Mouth/Throat: No oropharyngeal exudate.  Eyes: Pupils are equal, round, and reactive to light. Conjunctivae are normal.  Neck: Normal range of motion. Neck supple. No JVD present.  Cardiovascular: Normal rate, regular rhythm, normal heart sounds and intact distal pulses.  Pulmonary/Chest: No stridor. She has no wheezes. She has no rales.  Abdominal: Soft. Bowel sounds are normal. She exhibits no mass. There is no tenderness. There is no rebound and no guarding.  Musculoskeletal: Normal range of motion. She exhibits no deformity.  Neurological: She is alert and oriented to person, place, and time.  Skin: Skin is warm and dry. Capillary refill takes less than 2 seconds. She is not diaphoretic.  Psychiatric: She has a normal mood and affect.     ED Treatments / Results  Labs (all labs ordered are listed, but only abnormal results are displayed) Results for orders placed or performed during the hospital encounter of 02/03/18  CBC with Differential/Platelet  Result Value Ref Range   WBC 6.4 4.0 - 10.5 K/uL   RBC 4.28 3.87 - 5.11 MIL/uL   Hemoglobin 13.2 12.0 - 15.0 g/dL   HCT 40.1 36.0 - 46.0 %   MCV 93.7 78.0 - 100.0 fL   MCH 30.8 26.0 - 34.0 pg   MCHC 32.9 30.0 - 36.0 g/dL   RDW 14.3 11.5 - 15.5 %   Platelets 244 150 - 400 K/uL   Neutrophils Relative % 58 %   Neutro Abs 3.8 1.7 - 7.7 K/uL   Lymphocytes Relative 28 %   Lymphs Abs 1.8 0.7 - 4.0 K/uL   Monocytes Relative 8 %   Monocytes Absolute 0.5 0.1 - 1.0 K/uL   Eosinophils Relative 4 %   Eosinophils Absolute 0.2 0.0 - 0.7 K/uL   Basophils Relative 1 %   Basophils Absolute 0.1 0.0 - 0.1 K/uL   Immature Granulocytes 1 %   Abs Immature Granulocytes 0.0 0.0 - 0.1 K/uL    Protime-INR  Result Value Ref Range   Prothrombin Time 15.9 (H) 11.4 - 15.2 seconds   INR 1.28   APTT  Result Value Ref Range   aPTT 151 (H) 24 - 36 seconds  Comprehensive metabolic panel  Result Value Ref Range   Sodium 140 135 - 145 mmol/L   Potassium 3.9 3.5 - 5.1 mmol/L   Chloride 104 101 - 111 mmol/L   CO2 22 22 - 32 mmol/L   Glucose, Bld 365 (H) 65 - 99 mg/dL   BUN 17 6 - 20 mg/dL   Creatinine, Ser 1.27 (H) 0.44 - 1.00 mg/dL   Calcium 9.4 8.9 - 10.3 mg/dL   Total Protein 7.2 6.5 - 8.1 g/dL   Albumin 3.4 (L) 3.5 - 5.0 g/dL   AST 69 (H) 15 - 41 U/L   ALT 44 14 - 54 U/L   Alkaline Phosphatase 54 38 - 126 U/L   Total Bilirubin 0.8 0.3 - 1.2 mg/dL   GFR calc non Af Amer 43 (L) >60 mL/min   GFR calc Af Amer 50 (L) >60 mL/min   Anion gap  14 5 - 15  Troponin I  Result Value Ref Range   Troponin I 0.34 (HH) <0.03 ng/mL  Lipid panel  Result Value Ref Range   Cholesterol 112 0 - 200 mg/dL   Triglycerides 263 (H) <150 mg/dL   HDL 19 (L) >40 mg/dL   Total CHOL/HDL Ratio 5.9 RATIO   VLDL 53 (H) 0 - 40 mg/dL   LDL Cholesterol 40 0 - 99 mg/dL  I-stat troponin, ED  Result Value Ref Range   Troponin i, poc 0.54 (HH) 0.00 - 0.08 ng/mL   Comment NOTIFIED PHYSICIAN    Comment 3           Dg Chest Portable 1 View  Result Date: 02/03/2018 CLINICAL DATA:  STEMI EXAM: PORTABLE CHEST 1 VIEW COMPARISON:  11/28/2016 FINDINGS: Lungs are clear.  No pleural effusion or pneumothorax. Mild cardiomegaly. Loop recorder overlying the left hemithorax. IMPRESSION: No evidence of acute cardiopulmonary disease. Electronically Signed   By: Julian Hy M.D.   On: 02/03/2018 01:00    EKG  EKG Interpretation  Date/Time:  Wednesday Feb 03 2018 00:32:38 EDT Ventricular Rate:  114 PR Interval:    QRS Duration: 97 QT Interval:  375 QTC Calculation: 517 R Axis:   -79 Text Interpretation:  Sinus tachycardia Probable inferior infarct, acute Probable anterolateral infarct, Prolonged QT interval  Baseline wander in lead(s) V5 Confirmed by Randal Buba, Elfie Costanza ([id number]) on 02/03/2018 1:46:43 AM        Radiology Dg Chest Portable 1 View  Result Date: 02/03/2018 CLINICAL DATA:  STEMI EXAM: PORTABLE CHEST 1 VIEW COMPARISON:  11/28/2016 FINDINGS: Lungs are clear.  No pleural effusion or pneumothorax. Mild cardiomegaly. Loop recorder overlying the left hemithorax. IMPRESSION: No evidence of acute cardiopulmonary disease. Electronically Signed   By: Julian Hy M.D.   On: 02/03/2018 01:00    Procedures Procedures (including critical care time)  Medications Ordered in ED Medications  heparin ADULT infusion 100 units/mL (25000 units/273mL sodium chloride 0.45%) (1,000 Units/hr Intravenous New Bag/Given 02/03/18 0107)  nitroGLYCERIN 50 mg in dextrose 5 % 250 mL (0.2 mg/mL) infusion (has no administration in time range)  nitroGLYCERIN (NITROSTAT) SL tablet 0.4 mg (0.4 mg Sublingual Given 02/03/18 0139)  heparin injection 4,000 Units (4,000 Units Intravenous Given 02/03/18 0043)     MDM Reviewed: previous chart, nursing note and vitals Interpretation: labs, x-ray and ECG (positive troponin, NACPD on cxr) Total time providing critical care: 30-74 minutes. This excludes time spent performing separately reportable procedures and services. Consults: cardiology  CRITICAL CARE Performed by: Carlisle Beers Total critical care time: 60  minutes Critical care time was exclusive of separately billable procedures and treating other patients. Critical care was necessary to treat or prevent imminent or life-threatening deterioration. Critical care was time spent personally by me on the following activities: development of treatment plan with patient and/or surrogate as well as nursing, discussions with consultants, evaluation of patient's response to treatment, examination of patient, obtaining history from patient or surrogate, ordering and performing treatments and interventions, ordering and  review of laboratory studies, ordering and review of radiographic studies, pulse oximetry and re-evaluation of patient's condition.   Final Clinical Impressions(s) / ED Diagnoses   Final diagnoses:  Other type of acute myocardial infarction [facility])    Admit to medicine    Stephane Junkins, MD 02/03/18 Michelene Gardener, Sotero Brinkmeyer, MD 02/03/18 [id number]

## 2018-02-03 NOTE — Progress Notes (Addendum)
[place] for heparin Indication: chest pain/ACS  Allergies  Allergen Reactions  . Invokana [Canagliflozin] Diarrhea  . Nsaids Other (See Comments)    GI problems/ stomach pain  . Penicillins Rash    Has patient had a PCN reaction causing immediate rash, facial/tongue/throat swelling, SOB or lightheadedness with hypotension: No (rash on legs after 3 days [place] -June 2016) Has patient had a PCN reaction causing severe rash involving mucus membranes or skin necrosis: No Has patient had a PCN reaction that required hospitalization No Has patient had a PCN reaction occurring within the last 10 years: Yes If all of the above answers are "NO", then may proceed with Cephalosporin use.    Patient Measurements: Height: 5\' 1"  (154.9 cm) Weight: 208 lb (94.3 kg) IBW/kg (Calculated) : 47.8 Heparin Dosing Weight: 70 kg  Vital Signs: BP: 137/58 (05/29 1455) Pulse Rate: 87 (05/29 1500)  Labs: Recent Labs    02/03/18 0050 02/03/18 0410 02/03/18 0726  HGB 13.2 12.7  --   HCT 40.1 39.7  --   PLT 244 217  --   APTT 151*  --   --   LABPROT 15.9*  --   --   INR 1.28  --   --   HEPARINUNFRC  --   --  0.19*  CREATININE 1.27* 1.20*  --   TROPONINI 0.34*  --   --     Estimated Creatinine Clearance: 49 mL/min (A) (by C-G formula based on SCr of 1.2 mg/dL (H)).   Medical History: Past Medical History:  Diagnosis Date  . Acute combined systolic and diastolic heart failure ([place]) 11/19/2016  . Allergy   . Asthma   . CAD (coronary artery disease)    a. s/p lateral STEMI in 2015 2ry to occlusion of OM1 thought to be caused by coronary artery dissection, treated with POBA. b. 11/2016: STEMI with thrombotic occlusion of distal LCx which was treated with balloon angioplasty.   . Cataract    "just beginning"  . Colon polyps 7/07   hyperplastic and adenomatous  . Depression    pt unsure, psychologist said no  . Diverticulosis   . DM (diabetes mellitus)  ([place])   . Dyslipidemia   . External hemorrhoid   . Fatty liver   . Gastroparesis   . HTN (hypertension)   . Hyperlipidemia   . 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 Hypothyroidism   . Migraine   . Myocardial infarction ([place])    FEb 2015  . Nephrolithiasis   . Obesity   . Osteoporosis   . Sleep apnea    wears CPAP  . ST elevation myocardial infarction (STEMI) of inferolateral wall ([place]) 11/19/2016  . Stroke [facility])     Medications:  See medication history  Assessment: 67 yo lady to start heparin for CP.  She was not on anticoagulation PTA. Underwent cath that was terminated prematurely without intervention, noted total occlusion of mid to distal LAD at the apex. Conslted to resume heparin 8 hours after sheath removal. TR band was deflated and sheath removed at 1335 on 5/29.   Hgb 12.7, plt 217. No signs/symptoms of bleeding.   Goal of Therapy:  Heparin level 0.3-0.7 units/ml Monitor platelets by anticoagulation protocol: Yes   Plan:  Restart heparin infusion at 1200 units/hr on 2130 on 5/29 Check heparin level 6 hours after restart Daily heparin level and CBC while on heparin Monitor for bleeding complications  Doylene Canard, PharmD Clinical Pharmacist  Pager:  [phone] Phone: [phone] 02/03/2018,3:47 PM

## 2018-02-03 NOTE — ED Notes (Signed)
Cell phone, charger, and night gown given to daughter at bedside.

## 2018-02-03 NOTE — ED Notes (Signed)
Cardiology at bedside.

## 2018-02-03 NOTE — Progress Notes (Addendum)
Progress Note  Patient Name: Danielle Vargas Date of Encounter: 02/03/2018  Primary Cardiologist: No primary care provider on file.  Subjective   No further chest pain this morning.   Inpatient Medications    Scheduled Meds: . [START ON 02/04/2018] aspirin EC  81 mg Oral Daily  . carvedilol  6.25 mg Oral BID  . docusate sodium  100 mg Oral BID  . fenofibrate  160 mg Oral QHS  . fluticasone  2 spray Each Nare Daily  . furosemide  40 mg Oral BH-q7a  . gabapentin  300 mg Oral BID  . insulin aspart  0-20 Units Subcutaneous TID WC  . insulin aspart  0-5 Units Subcutaneous QHS  . insulin aspart  55 Units Subcutaneous QAC supper  . insulin aspart  70 Units Subcutaneous BID WC  . insulin glargine  45 Units Subcutaneous BID  . isosorbide mononitrate  30 mg Oral Daily  . levothyroxine  100 mcg Oral QAC breakfast  . metoCLOPramide  10 mg Oral BID  . mometasone-formoterol  2 puff Inhalation BID  . montelukast  10 mg Oral QHS  . multivitamin with minerals  1 tablet Oral Daily  . pantoprazole  40 mg Oral Daily  . polyethylene glycol  17 g Oral QODAY  . rosuvastatin  20 mg Oral Daily  . ticagrelor  90 mg Oral BID  . topiramate  50 mg Oral BID   Continuous Infusions: . heparin 1,000 Units/hr (02/03/18 0107)   PRN Meds: acetaminophen, albuterol, guaiFENesin, nitroGLYCERIN, ondansetron (ZOFRAN) IV, tiZANidine   Vital Signs    Vitals:   02/03/18 0500 02/03/18 0515 02/03/18 0530 02/03/18 0641  BP: 123/86 (!) 143/74 132/66 134/66  Pulse: 93 95 95 90  Resp: 15 13 14 16   Temp:      TempSrc:      SpO2: 99% 98% 98% 99%  Weight:      Height:       No intake or output data in the 24 hours ending 02/03/18 0739 Filed Weights   02/03/18 0031  Weight: 208 lb (94.3 kg)    Telemetry    SR - Personally Reviewed  ECG    SR with Q waves in inferolateral leads - Personally Reviewed  Physical Exam   General: Well developed, well nourished, older W female appearing in no acute  distress. Head: Normocephalic, atraumatic.  Neck: Supple without bruits, JVD. Lungs:  Resp regular and unlabored, CTA. Heart: RRR, S1, S2, no S3, S4, or murmur; no rub. Abdomen: Soft, non-tender, non-distended with normoactive bowel sounds.  Extremities: No clubbing, cyanosis, Chronic LLE edema. Distal pedal pulses are 2+ bilaterally. Residual left sided weakness from prior CVA.  Neuro: Alert and oriented X 3. Moves all extremities spontaneously. Psych: Normal affect.  Labs    Chemistry Recent Labs  Lab 02/03/18 0050 02/03/18 0410  NA 140 139  K 3.9 4.2  CL 104 104  CO2 22 21*  GLUCOSE 365* 352*  BUN 17 19  CREATININE 1.27* 1.20*  CALCIUM 9.4 9.5  PROT 7.2  --   ALBUMIN 3.4*  --   AST 69*  --   ALT 44  --   ALKPHOS 54  --   BILITOT 0.8  --   GFRNONAA 43* 46*  GFRAA 50* 54*  ANIONGAP 14 14     Hematology Recent Labs  Lab 02/03/18 0050 02/03/18 0410  WBC 6.4 6.5  RBC 4.28 4.16  HGB 13.2 12.7  HCT 40.1 39.7  MCV 93.7 95.4  MCH 30.8 30.5  MCHC 32.9 32.0  RDW 14.3 14.3  PLT 244 217    Cardiac Enzymes Recent Labs  Lab 02/03/18 0050  TROPONINI 0.34*    Recent Labs  Lab 02/03/18 0053 02/03/18 0540  TROPIPOC 0.54* 3.72*     BNP Recent Labs  Lab 02/03/18 0050  BNP 60.8     DDimer No results for input(s): DDIMER in the last 168 hours.    Radiology    Dg Chest Portable 1 View  Result Date: 02/03/2018 CLINICAL DATA:  STEMI EXAM: PORTABLE CHEST 1 VIEW COMPARISON:  11/28/2016 FINDINGS: Lungs are clear.  No pleural effusion or pneumothorax. Mild cardiomegaly. Loop recorder overlying the left hemithorax. IMPRESSION: No evidence of acute cardiopulmonary disease. Electronically Signed   By: Julian Hy M.D.   On: 02/03/2018 01:00    Cardiac Studies   N/a   Patient Profile     67 y.o. female with PMH of ICM (25%>>60%), CAD s/p STEMI with balloon angioplasty 2018, IDDM, OSA on Cpap, CVA, HTN and obesity who presented with chest pain and STEMI  called in the field. Cancelled on arrival, but with elevation in troponin.   Assessment & Plan    1. NSTEMI: Presented with sudden onset of chest pain this morning. Initially called a STEMI in the field but cancelled on arrival. Trop 0.54>>3.72 this morning. Remains on IV heparin and nitro without chest pain. Given symptoms and elevated troponin will plan for cardiac cath today.  -- The patient understands that risks included but are not limited to stroke (1 in 1000), death (1 in 71), kidney failure [usually temporary] (1 in 500), bleeding (1 in 200), allergic reaction [possibly serious] (1 in 200).  -- continue IV heparin, nitro  -- check echo  2. Hx of ICM: EF 25% back in 2018 with improvement to normal function on last echo from 6/18. No signs of volume overload on exam. Chronic LLE edema per patient since her stroke.   3. IDDM: on lantus and SSI. Last Hgb A1c 8.8 (4/19)  4. OSA: reports being compliant with Cpap   5. HL: on statin. LDL 40.   Signed, Reino Bellis, NP  02/03/2018, 7:39 AM  Pager # [phone]   I have examined the patient and reviewed assessment and plan and discussed with patient.  Agree with above as stated.  Discussed cath.  No questions.  SHe underwent cath and has an occluded distal LAD with collaterals.  Medical therapy.  Possible discharge tomorrow.   Larae Grooms  For questions or updates, please contact [facility] Please consult [email or web address] for contact info under Cardiology/STEMI.

## 2018-02-04 ENCOUNTER — Inpatient Hospital Stay (HOSPITAL_COMMUNITY): Payer: Medicare Other

## 2018-02-04 ENCOUNTER — Encounter (HOSPITAL_COMMUNITY): Payer: Self-pay | Admitting: Interventional Cardiology

## 2018-02-04 ENCOUNTER — Other Ambulatory Visit: Payer: Self-pay

## 2018-02-04 DIAGNOSIS — I5030 Unspecified diastolic (congestive) heart failure: Secondary | ICD-10-CM

## 2018-02-04 DIAGNOSIS — L8990 Pressure ulcer of unspecified site, unspecified stage: Secondary | ICD-10-CM

## 2018-02-04 DIAGNOSIS — L8940 Pressure ulcer of contiguous site of back, buttock and hip, unspecified stage: Secondary | ICD-10-CM

## 2018-02-04 LAB — CBC
HEMATOCRIT: 38 % (ref 36.0–46.0)
Hemoglobin: 12.1 g/dL (ref 12.0–15.0)
MCH: 30.3 pg (ref 26.0–34.0)
MCHC: 31.8 g/dL (ref 30.0–36.0)
MCV: 95.2 fL (ref 78.0–100.0)
PLATELETS: 209 10*3/uL (ref 150–400)
RBC: 3.99 MIL/uL (ref 3.87–5.11)
RDW: 14.6 % (ref 11.5–15.5)
WBC: 6 10*3/uL (ref 4.0–10.5)

## 2018-02-04 LAB — GLUCOSE, CAPILLARY
GLUCOSE-CAPILLARY: 196 mg/dL — AB (ref 65–99)
GLUCOSE-CAPILLARY: 210 mg/dL — AB (ref 65–99)
Glucose-Capillary: 214 mg/dL — ABNORMAL HIGH (ref 65–99)

## 2018-02-04 LAB — ECHOCARDIOGRAM COMPLETE
Height: 61 in
WEIGHTICAEL: 3541.47 [oz_av]

## 2018-02-04 MED ORDER — PERFLUTREN LIPID MICROSPHERE
INTRAVENOUS | Status: AC
  Administered 2018-02-04: 2.2 mg
  Filled 2018-02-04: qty 10

## 2018-02-04 MED FILL — Heparin Sod (Porcine)-NaCl IV Soln 1000 Unit/500ML-0.9%: INTRAVENOUS | Qty: 1000 | Status: AC

## 2018-02-04 NOTE — Consult Note (Signed)
[place] Nurse wound consult note Reason for Consult:presure injuries found on admission.  Patient from SNF, reports that she sits in [facility] "all day long" Wound type:  Stage 2 pressure injury left ischial tuberosity: 4.5cm x 6cm x 0.1cm  Stage 2 pressure injury right ischial tuberosity: 4cm x 5cm x 0cm  Stage 2 pressure injury left trochanter: 1.5cm x 1.0cm x 0.1cm  MASD intertriginous area (under pannus) with superficial skin loss from moisture does not appear to be acutely fungal at this time Pressure Injury POA: Yes  Measurement:see above Wound KFE:[id number] ischial wounds are dark pink, do not blanch and their is superficial fluctuance (serous filled), left hip with partical thickness skin loss, blister roof but now dry Drainage (amount, consistency, odor) none  Periwound:intact  Dressing procedure/placement/frequency: Silicone foam to protect sites, change every 3 days. Assess for changes of wounds each shift SPORT mattress in place while in the ICU Antimicrobial wicking fabric to be placed under her pannus  Needs appropriate cushion for her WC in the facility [facility]) will request CM to follow up on this.  Discussed POC with patient and bedside nurse.  Re consult if needed, will not follow at this time. Thanks  Namita Yearwood [employer], RN,CWOCN, CNS, [place] [phone])

## 2018-02-04 NOTE — Progress Notes (Signed)
  Echocardiogram 2D Echocardiogram has been performed.  Danielle Vargas 02/04/2018, 12:51 PM

## 2018-02-04 NOTE — Progress Notes (Addendum)
Progress Note  Patient Name: Danielle Vargas Date of Encounter: 02/04/2018  Primary Cardiologist: No primary care provider on file.   Subjective   No chest pain  Inpatient Medications    Scheduled Meds: . aspirin  81 mg Oral Daily  . carvedilol  6.25 mg Oral BID  . clopidogrel  75 mg Oral Q breakfast  . docusate sodium  100 mg Oral BID  . fenofibrate  160 mg Oral QHS  . fluticasone  2 spray Each Nare Daily  . furosemide  40 mg Oral BH-q7a  . gabapentin  300 mg Oral BID  . insulin aspart  0-20 Units Subcutaneous TID WC  . insulin aspart  0-5 Units Subcutaneous QHS  . insulin aspart  55 Units Subcutaneous QAC supper  . insulin aspart  70 Units Subcutaneous BID WC  . insulin glargine  45 Units Subcutaneous BID  . isosorbide mononitrate  30 mg Oral Daily  . levothyroxine  100 mcg Oral QAC breakfast  . metoCLOPramide  10 mg Oral BID  . mometasone-formoterol  2 puff Inhalation BID  . montelukast  10 mg Oral QHS  . multivitamin with minerals  1 tablet Oral Daily  . pantoprazole  40 mg Oral Daily  . polyethylene glycol  17 g Oral QODAY  . rosuvastatin  20 mg Oral Daily  . sodium chloride flush  3 mL Intravenous Q12H  . topiramate  50 mg Oral BID   Continuous Infusions: . sodium chloride     PRN Meds: sodium chloride, acetaminophen, albuterol, guaiFENesin, nitroGLYCERIN, ondansetron (ZOFRAN) IV, oxyCODONE, sodium chloride flush, tiZANidine   Vital Signs    Vitals:   02/04/18 0700 02/04/18 0729 02/04/18 0800 02/04/18 1055  BP: 124/85  (!) 160/84 125/79  Pulse:    90  Resp: 15  19   Temp:  98.7 F (37.1 C)    TempSrc:  Oral    SpO2: 97%  99%   Weight:      Height:        Intake/Output Summary (Last 24 hours) at 02/04/2018 1204 Last data filed at 02/04/2018 0800 Gross per 24 hour  Intake 1644.43 ml  Output 500 ml  Net 1144.43 ml   Filed Weights   02/03/18 0031 02/04/18 0500  Weight: 208 lb (94.3 kg) 221 lb 5.5 oz (100.4 kg)    Telemetry    NSR- Personally  Reviewed  ECG    NSR, PRWP, old anterior MI - Personally Reviewed  Physical Exam   GEN: No acute distress.   Neck: No JVD Cardiac: RRR, no murmurs, rubs, or gallops.  Respiratory: Clear to auscultation bilaterally. GI: Soft, nontender, non-distended  MS: No edema; No deformity. Left wrist without hematoma Neuro:  Nonfocal  Psych: Normal affect   Labs    Chemistry Recent Labs  Lab 02/03/18 0050 02/03/18 0410  NA 140 139  K 3.9 4.2  CL 104 104  CO2 22 21*  GLUCOSE 365* 352*  BUN 17 19  CREATININE 1.27* 1.20*  CALCIUM 9.4 9.5  PROT 7.2  --   ALBUMIN 3.4*  --   AST 69*  --   ALT 44  --   ALKPHOS 54  --   BILITOT 0.8  --   GFRNONAA 43* 46*  GFRAA 50* 54*  ANIONGAP 14 14     Hematology Recent Labs  Lab 02/03/18 0050 02/03/18 0410 02/04/18 0733  WBC 6.4 6.5 6.0  RBC 4.28 4.16 3.99  HGB 13.2 12.7 12.1  HCT 40.1 39.7 38.0  MCV 93.7 95.4 95.2  MCH 30.8 30.5 30.3  MCHC 32.9 32.0 31.8  RDW 14.3 14.3 14.6  PLT 244 217 209    Cardiac Enzymes Recent Labs  Lab 02/03/18 0050  TROPONINI 0.34*    Recent Labs  Lab 02/03/18 0053 02/03/18 0540  TROPIPOC 0.54* 3.72*     BNP Recent Labs  Lab 02/03/18 0050  BNP 60.8     DDimer No results for input(s): DDIMER in the last 168 hours.   Radiology    Dg Chest Portable 1 View  Result Date: 02/03/2018 CLINICAL DATA:  STEMI EXAM: PORTABLE CHEST 1 VIEW COMPARISON:  11/28/2016 FINDINGS: Lungs are clear.  No pleural effusion or pneumothorax. Mild cardiomegaly. Loop recorder overlying the left hemithorax. IMPRESSION: No evidence of acute cardiopulmonary disease. Electronically Signed   By: Julian Hy M.D.   On: 02/03/2018 01:00    Cardiac Studies   Occluded distal LAD.  I personally reviewed the films.  Patient Profile     67 y.o. female with anterior MI  Assessment & Plan    1) Medical therapy for MI.  DAPT. High dose statin.  No angina.  If she develops angina, could add Imdur.    Wound care  consult for possible pressure ulcer.   On beta blocker as well.  Echo pending.  Last echo showed normal LV function.   Transfer to floor.   For questions or updates, please contact [place] Please consult [email or web address] for contact info under Cardiology/STEMI.      Signed, Larae Grooms, MD  02/04/2018, 12:04 PM

## 2018-02-04 NOTE — Progress Notes (Signed)
Placed pt on cpap with nasal mask and pressure of 8. 2L bled in. Pt tolerating well. Rt to monitor as needed.

## 2018-02-04 NOTE — Plan of Care (Signed)
  Problem: Nutrition: Goal: Adequate nutrition will be maintained Outcome: Progressing   Problem: Pain Managment: Goal: General experience of comfort will improve Outcome: Progressing   Problem: Education: Goal: Knowledge of General Education information will improve Outcome: Completed/Met

## 2018-02-05 LAB — CBC
HEMATOCRIT: 36.1 % (ref 36.0–46.0)
HEMOGLOBIN: 11.4 g/dL — AB (ref 12.0–15.0)
MCH: 30.8 pg (ref 26.0–34.0)
MCHC: 31.6 g/dL (ref 30.0–36.0)
MCV: 97.6 fL (ref 78.0–100.0)
Platelets: 183 10*3/uL (ref 150–400)
RBC: 3.7 MIL/uL — AB (ref 3.87–5.11)
RDW: 14.7 % (ref 11.5–15.5)
WBC: 5.4 10*3/uL (ref 4.0–10.5)

## 2018-02-05 LAB — GLUCOSE, CAPILLARY
GLUCOSE-CAPILLARY: 227 mg/dL — AB (ref 65–99)
GLUCOSE-CAPILLARY: 194 mg/dL — AB (ref 65–99)
GLUCOSE-CAPILLARY: 206 mg/dL — AB (ref 65–99)
Glucose-Capillary: 199 mg/dL — ABNORMAL HIGH (ref 65–99)

## 2018-02-05 MED ORDER — CLOPIDOGREL BISULFATE 75 MG PO TABS: 75.0000 mg | ORAL_TABLET | Freq: Every day | ORAL | 11 refills | Status: AC

## 2018-02-05 MED FILL — Perflutren Lipid Microsphere IV Susp 6.52 MG/ML: INTRAVENOUS | Qty: 2 | Status: AC

## 2018-02-05 NOTE — Progress Notes (Signed)
Pt w/c bound. Discussed with pt use of Plavix, ASA, statin, and NTG. Reviewed MI restrictions. Gave HH and diabetic diet sheets. Aware pt from SNF and has no control over food. Discussed cardiac rehab, pt states she will not be able to attend, but will send CRP II to [facility].   [phone]  Rufina Falco, RN BSN 02/05/2018 11:52 AM

## 2018-02-05 NOTE — Progress Notes (Signed)
CSW spoke to admissions at [facility]. Facility is ready for patient to return today. Facility requesting orders be put in discharge summary for [place] cushion for patient's wheelchair - CSW paged MD. CSW to follow and support with discharge back to facility.  Estanislado Emms, [place]

## 2018-02-05 NOTE — Clinical Social Work Note (Signed)
This is a late entry note. CSW met with patient and son at bedside on 02/04/18 on 2H.  Clinical Social Work Assessment  Patient Details  Name: Danielle Vargas MRN: [id number] Date of Birth: Mar 04, 1951  Date of referral:  02/05/18               Reason for consult:  Facility Placement, Discharge Planning(from [facility])                Permission sought to share information with:  Facility Sport and exercise psychologist, Family Supports Permission granted to share information::  Yes, Verbal Permission Granted  Name::     Riki Rusk  Agency::  [facility]  Relationship::  daughter  Contact Information:  [phone]  Housing/Transportation Living arrangements for the past 2 months:  [place] of Information:  Patient Patient Interpreter Needed:  None Criminal Activity/Legal Involvement Pertinent to Current Situation/Hospitalization:  No - Comment as needed Significant Relationships:  Adult Children Lives with:  Facility Resident Do you feel safe going back to the place where you live?  Yes Need for family participation in patient care:  Yes (Comment)  Care giving concerns: Patient is a long term care resident at [facility]. Patient uses a wheelchair at baseline.   Social Worker assessment / plan: CSW met with patient and son at bedside. Patient alert and oriented. CSW introduced self and role and discussed disposition planning. Patient reported she has lived at [facility] for over a year. Patient is agreeable to return to the facility at discharge.   CSW also consulted by [facility] for wheelchair cushion for patient. CSW to discuss with patient's facility. CSW to follow for medical readiness and will support with discharge planning.  Employment status:  Retired, Disabled (Comment on whether or not currently receiving Disability) Insurance information:  Medicare, Medicaid [place] PT Recommendations:  Not assessed at this time Information / Referral  to community resources:  [place]  Patient/Family's Response to care: Patient appreciative of care.  Patient/Family's Understanding of and Emotional Response to Diagnosis, Current Treatment, and Prognosis: Patient with understanding of her conditions and agreeable to return to her facility.  Emotional Assessment Appearance:  Appears stated age Attitude/Demeanor/Rapport:  Engaged Affect (typically observed):  Appropriate, Calm Orientation:  Oriented to Self, Oriented to Place, Oriented to  Time, Oriented to Situation Alcohol / Substance use:  Not Applicable Psych involvement (Current and /or in the community):  No (Comment)  Discharge Needs  Concerns to be addressed:  Discharge Planning Concerns, Care Coordination Readmission within the last 30 days:  No Current discharge risk:  Physical Impairment Barriers to Discharge:  Continued Medical Work up   Estanislado Emms, LCSW 02/05/2018, 8:35 AM

## 2018-02-05 NOTE — Discharge Summary (Addendum)
Discharge Summary    Patient ID: Danielle Vargas,  MRN: [id number], DOB/AGE: [birth year] 67 y.o.  Admit date: 02/03/2018 Discharge date: 02/05/2018  Primary Care Provider: Marton Redwood Primary Cardiologist: Dr. Tamala Julian   Discharge Diagnoses    Principal Problem:   Acute myocardial infarction [facility]) Active Problems:   Chronic diastolic heart failure ([place])   CAD in native artery   Morbid obesity ([place])   Pressure injury of skin   Allergies Allergies  Allergen Reactions  . Invokana [Canagliflozin] Diarrhea  . Nsaids Other (See Comments)    GI problems/ stomach pain  . Penicillins Rash    Has patient had a PCN reaction causing immediate rash, facial/tongue/throat swelling, SOB or lightheadedness with hypotension: No (rash on legs after 3 days [place] -June 2016) Has patient had a PCN reaction causing severe rash involving mucus membranes or skin necrosis: No Has patient had a PCN reaction that required hospitalization No Has patient had a PCN reaction occurring within the last 10 years: Yes If all of the above answers are "NO", then may proceed with Cephalosporin use.    Diagnostic Studies/Procedures    LEFT HEART CATH AND CORONARY ANGIOGRAPHY  02/03/18  Conclusion    Coronary angiography only.  Total occlusion of the mid to distal LAD at the apex.  Collaterals are present left and left.  Normal left main  Proximal and mid LAD is widely patent with luminal irregularities up to 40%.  Circumflex is widely patent.  The previously occluded second obtuse marginal has recanalized.  Due to technical difficulties from the left radial, the case was terminated prematurely but with what I believe is adequate information to provide direction for management.  RECOMMENDATIONS:   Aspirin and Plavix for at least 30 days and perhaps 6 to 12 months.  Risk factor modification.  If recurrent angina could consider cardiac cath from the femoral approach however both groin contains  significant fungal/candidal infection.  Very small right radial artery prevented cath from that approach.  2 g of Ancef given because of difficulty with maintaining sterility in the left arm.   Diagnostic Diagram           History of Present Illness     Danielle Vargas is a 67 y.o. female w/ history of HFrEF (25%), CAD s/p prior STEMI and PCI (most recently 2018), IDDM, OSA on CPAP, CVA, HTN, and obesity who presents with concern for STEMI 02/03/18.   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 This was treated with angioplasty. EF was 35-40% at the time. Follow-up echo 01/2014 demonstrated improved LV function with an EF of 55%. Represented March 2018 with acute occlusion of the circumflex treated with angioplasty. There was acute decrease in LV systolic function to less than 25%.  Most recent LVEF is 65% June 2018.  Patient developed acute onset central chest and R arm pain approximately 2 hours prior to ED admission on 02/03/18. Was sitting resting at the time. Same symptoms that she had during her prior MI's. Pain was non-radiating and with no associated symptoms. Had been taking all of her medications as prescribed. Has not had any chest pain since her prior MI in 2018. Pain did not improve so she was brought to ED for workup.   Initial ECG by EMS concerning for STEMI w/ ST elevation in I and aVL. Brought to ED as code STEMI  but cancelled on arrival. Was given 3 doses of SL nitro and pain symptoms completely resolved. On arrival to ED, patient had no complaints. ECG revealed <1 mm ST elevation in I and aVL with nonspecific changes throughout and Q waves in the inferior limb leads and throughout the precordium. She was given ASA 324mg  and started on a heparin drip.   Hospital Course     Consultants: None  1. NSTEMI - Poc troponin 0.54>>3.72. Treated with IV heparin and  nitro without recurrent chest pain. Cath showed total occlusion of mid to distal LAD with left to left collaterals. Mild disease otherwise. Recommended medical therapy. Very small right radial artery resulting in premature termination of cath but had adequate data. If recurrent angina could consider cardiac cath from the femoral approach however both groin contains significant fungal/candidal infection. Renal function stable. Continue ASA, Plavix (new this admission, she was on Brillinta prior to arrival per review of chart but last note by Dr. Tamala Julian 06/2017 indication that plan to [place] 11/2017), statin, imdur and BB. No recurrent chest pain.   2. Hx of ICM - Echo this admission showed LVEF of 60-65% with grade 1 DD and apical akineses.  - Continue BB and lasix.   3. HLD - 02/03/2018: Cholesterol 112; HDL 19; LDL Cholesterol 40; Triglycerides 263; VLDL 53  - Continue statin.   The patient has been seen by Dr. Irish Lack today and deemed ready for discharge home. All follow-up appointments have been scheduled. Discharge medications are listed below.    Discharge Vitals Blood pressure (!) 114/52, pulse 73, temperature 98.4 F (36.9 C), temperature source Oral, resp. rate 18, height 5\' 1"  (1.549 m), weight 211 lb 10.3 oz (96 kg), SpO2 96 %.  Filed Weights   02/03/18 0031 02/04/18 0500 02/05/18 0442  Weight: 208 lb (94.3 kg) 221 lb 5.5 oz (100.4 kg) 211 lb 10.3 oz (96 kg)   Physical exam per MD  Labs & Radiologic Studies     CBC Recent Labs    02/03/18 0050  02/04/18 0733 02/05/18 0325  WBC 6.4   < > 6.0 5.4  NEUTROABS 3.8  --   --   --   HGB 13.2   < > 12.1 11.4*  HCT 40.1   < > 38.0 36.1  MCV 93.7   < > 95.2 97.6  PLT 244   < > 209 183   < > = values in this interval not displayed.   Basic Metabolic Panel Recent Labs    02/03/18 0050 02/03/18 0410  NA 140 139  K 3.9 4.2  CL 104 104  CO2 22 21*  GLUCOSE 365* 352*  BUN 17 19  CREATININE 1.27* 1.20*  CALCIUM 9.4 9.5     Liver Function Tests Recent Labs    02/03/18 0050  AST 69*  ALT 44  ALKPHOS 54  BILITOT 0.8  PROT 7.2  ALBUMIN 3.4*   Cardiac Enzymes Recent Labs    02/03/18 0050  TROPONINI 0.34*   Fasting Lipid Panel Recent Labs    02/03/18 0050  CHOL 112  HDL 19*  LDLCALC 40  TRIG 263*  CHOLHDL 5.9   Thyroid Function Tests Recent Labs    02/03/18 0410  TSH 3.969    Dg Chest Portable 1 View  Result Date: 02/03/2018 CLINICAL DATA:  STEMI EXAM: PORTABLE CHEST 1 VIEW COMPARISON:  11/28/2016 FINDINGS: Lungs are clear.  No pleural effusion or pneumothorax. Mild cardiomegaly. Loop recorder overlying the left hemithorax. IMPRESSION: No  evidence of acute cardiopulmonary disease. Electronically Signed   By: Julian Hy M.D.   On: 02/03/2018 01:00    Disposition   Pt is being discharged home today in good condition.  Follow-up Plans & Appointments     Contact information for follow-up providers    Burtis Junes, NP. Go on 02/10/2018.   Specialties:  Nurse Practitioner, Interventional Cardiology, Cardiology, Radiology Why:  @11am  for post hospital follow up Contact information: [address] [place] [address] [phone]            Contact information for after-discharge care    Destination    [facility] .   Service:  Skilled Nursing Contact information: [place] [phone]                 Discharge Instructions    Amb Referral to Cardiac Rehabilitation   Complete by:  As directed    Diagnosis:  NSTEMI   Diet - low sodium heart healthy   Complete by:  As directed    Discharge instructions   Complete by:  As directed    NO HEAVY LIFTING (>10lbs) X 2 WEEKS. NO SEXUAL ACTIVITY X 2 WEEKS. NO DRIVING X 1 WEEK. NO SOAKING BATHS, HOT TUBS, POOLS, ETC., X 7 DAYS.   Increase activity slowly   Complete by:  As directed       Discharge Medications    Allergies as of 02/05/2018      Reactions   Invokana [canagliflozin] Diarrhea   Nsaids Other (See Comments)   GI problems/ stomach pain   Penicillins Rash   Has patient had a PCN reaction causing immediate rash, facial/tongue/throat swelling, SOB or lightheadedness with hypotension: No (rash on legs after 3 days [place] -June 2016) Has patient had a PCN reaction causing severe rash involving mucus membranes or skin necrosis: No Has patient had a PCN reaction that required hospitalization No Has patient had a PCN reaction occurring within the last 10 years: Yes If all of the above answers are "NO", then may proceed with Cephalosporin use.      Medication List    STOP taking these medications   ticagrelor 90 MG Tabs tablet Commonly known as:  BRILINTA     TAKE these medications   acetaminophen 325 MG tablet Commonly known as:  TYLENOL Take 650 mg by mouth every 6 (six) hours as needed for mild pain.   albuterol 108 (90 Base) MCG/ACT inhaler Commonly known as:  PROVENTIL HFA;VENTOLIN HFA Inhale 2 puffs into the lungs every 6 (six) hours as needed for wheezing or shortness of breath.   aspirin 81 MG EC tablet Take 1 tablet (81 mg total) by mouth daily.   budesonide-formoterol 160-4.5 MCG/ACT inhaler Commonly known as:  SYMBICORT Inhale 1 puff into the lungs 2 (two) times daily.   carvedilol 3.125 MG tablet Commonly known as:  COREG Take 2 tablets by mouth 2 (two) times daily.   clopidogrel 75 MG tablet Commonly known as:  PLAVIX Take 1 tablet (75 mg total) by mouth daily with breakfast. Start taking on:  02/06/2018   CRESTOR 20 MG tablet Generic drug:  rosuvastatin Take 20 mg by mouth daily.   docusate sodium 100 MG capsule Commonly known as:  COLACE Take 100 mg by mouth 2 (two) times daily.   fenofibrate 160 MG tablet Take 160 mg by mouth at bedtime.   furosemide 40 MG tablet Commonly known as:  LASIX Take 40 mg by mouth every morning.   gabapentin 300 MG  capsule Commonly known as:  NEURONTIN Take 300 mg by mouth 2 (two) times daily.   guaiFENesin 200 MG tablet Take 600 mg by mouth 2 (two) times daily as needed for cough or to loosen phlegm.   insulin glargine 100 UNIT/ML injection Commonly known as:  LANTUS Inject 50 units in am and 45 units at bedtime. What changed:    how much to take  how to take this  when to take this  additional instructions   insulin regular 100 units/mL injection Commonly known as:  NOVOLIN R,HUMULIN R Inject 55-70 Units into the skin 3 (three) times daily before meals. Inject 70 units into the skin before breakfast, 70 units into the skin before lunch, and 55 units into the skin before dinner.   isosorbide mononitrate 30 MG 24 hr tablet Commonly known as:  IMDUR Take 30 mg by mouth daily.   levothyroxine 100 MCG tablet Commonly known as:  SYNTHROID, LEVOTHROID Take 1 tablet (100 mcg total) by mouth daily before breakfast.   metoCLOPramide 10 MG tablet Commonly known as:  REGLAN Take 10 mg by mouth 2 (two) times daily.   mometasone 50 MCG/ACT nasal spray Commonly known as:  NASONEX Place 2 sprays into the nose daily.   montelukast 10 MG tablet Commonly known as:  SINGULAIR Take 10 mg by mouth at bedtime.   multivitamin with minerals Tabs tablet Take 1 tablet by mouth daily. One A Day 50+   nitroGLYCERIN 0.4 MG SL tablet Commonly known as:  NITROSTAT Place 1 tablet (0.4 mg total) under the tongue every 5 (five) minutes as needed for chest pain.   OXYGEN Inhale 2 L into the lungs at bedtime as needed (dyspnea).   pantoprazole 40 MG tablet Commonly known as:  PROTONIX Take 1 tablet (40 mg total) by mouth daily.   polyethylene glycol packet Commonly known as:  MIRALAX / GLYCOLAX Take 17 g by mouth every other day.   tiZANidine 2 MG tablet Commonly known as:  ZANAFLEX Take 2 mg by mouth every 8 (eight) hours as needed for muscle spasms.   topiramate 50 MG tablet Commonly known as:   TOPAMAX Take 50 mg by mouth 2 (two) times daily.        Aspirin prescribed at discharge?  Yes High Intensity Statin Prescribed? (Lipitor 40-80mg  or Crestor 20-40mg ): Yes Beta Blocker Prescribed? Yes For EF 45% or less, Was ACEI/ARB Prescribed? N/A ADP Receptor Inhibitor Prescribed? (i.e. Plavix etc.-Includes Medically Managed Patients): Yes For EF <40%, Aldosterone Inhibitor Prescribed? N/A Was EF assessed during THIS hospitalization? Yes Was Cardiac Rehab II ordered? (Included Medically managed Patients): Yes   Outstanding Labs/Studies   None  Duration of Discharge Encounter   Greater than 30 minutes including physician time.  Signed, Leanor Kail PA-C 02/05/2018, 12:30 PM   I have examined the patient and reviewed assessment and plan and discussed with patient.  Agree with above as stated.     CAD/NSTEMI: OK to discharge today.  COntinue DAPT along with aggressive secondary prevention.  Will need case manager to help with transfer back to facility.  Medical treatment for DM and hyperlipidemia.  Larae Grooms

## 2018-02-05 NOTE — Clinical Social Work Placement (Signed)
   CLINICAL SOCIAL WORK PLACEMENT  NOTE  Date:  02/05/2018  Patient Details  Name: Danielle Vargas MRN: [id number] Date of Birth: 07-31-1951  Clinical Social Work is seeking post-discharge placement for this patient at the [place] level of care (*CSW will initial, date and re-position this form in  chart as items are completed):  Yes   Patient/family provided with [facility] Work Department's list of facilities offering this level of care within the geographic area requested by the patient (or if unable, by the patient's family).  Yes   Patient/family informed of their freedom to choose among providers that offer the needed level of care, that participate in Medicare, Medicaid or managed care program needed by the patient, have an available bed and are willing to accept the patient.  Yes   Patient/family informed of [facility]'s ownership interest in [facility] and [facility], as well as of the fact that they are under no obligation to receive care at these facilities.  PASRR submitted to EDS on       PASRR number received on       Existing PASRR number confirmed on 02/05/18     FL2 transmitted to all facilities in geographic area requested by pt/family on 02/05/18     FL2 transmitted to all facilities within larger geographic area on       Patient informed that his/her managed care company has contracts with or will negotiate with certain facilities, including the following:  [facility], [facility]     Yes   Patient/family informed of bed offers received.  Patient chooses bed at [facility], [place]     Physician recommends and patient chooses bed at      Patient to be transferred to [facility] on 02/05/18.  Patient to be transferred to facility by [facility]     Patient family notified on 02/05/18 of transfer.  Name of family member notified:        PHYSICIAN Please sign FL2     Additional Comment:     _______________________________________________ Estanislado Emms, LCSW 02/05/2018, 1:51 PM

## 2018-02-05 NOTE — Progress Notes (Signed)
Progress Note  Patient Name: Danielle Vargas Date of Encounter: 02/05/2018  Primary Cardiologist: Sinclair Grooms, MD   Subjective   No chest pain.  Inpatient Medications    Scheduled Meds: . aspirin  81 mg Oral Daily  . carvedilol  6.25 mg Oral BID  . clopidogrel  75 mg Oral Q breakfast  . docusate sodium  100 mg Oral BID  . fenofibrate  160 mg Oral QHS  . fluticasone  2 spray Each Nare Daily  . furosemide  40 mg Oral BH-q7a  . gabapentin  300 mg Oral BID  . insulin aspart  0-20 Units Subcutaneous TID WC  . insulin aspart  0-5 Units Subcutaneous QHS  . insulin aspart  55 Units Subcutaneous QAC supper  . insulin aspart  70 Units Subcutaneous BID WC  . insulin glargine  45 Units Subcutaneous BID  . isosorbide mononitrate  30 mg Oral Daily  . levothyroxine  100 mcg Oral QAC breakfast  . metoCLOPramide  10 mg Oral BID  . mometasone-formoterol  2 puff Inhalation BID  . montelukast  10 mg Oral QHS  . multivitamin with minerals  1 tablet Oral Daily  . pantoprazole  40 mg Oral Daily  . polyethylene glycol  17 g Oral QODAY  . rosuvastatin  20 mg Oral Daily  . sodium chloride flush  3 mL Intravenous Q12H  . topiramate  50 mg Oral BID   Continuous Infusions: . sodium chloride     PRN Meds: sodium chloride, acetaminophen, albuterol, guaiFENesin, nitroGLYCERIN, ondansetron (ZOFRAN) IV, oxyCODONE, sodium chloride flush, tiZANidine   Vital Signs    Vitals:   02/04/18 1908 02/04/18 2058 02/05/18 0442 02/05/18 0902  BP: (!) 123/57 133/60 (!) 114/52   Pulse: 88 83 73   Resp:  17 18   Temp: 99.2 F (37.3 C) 98.7 F (37.1 C) 98.4 F (36.9 C)   TempSrc: Oral Oral Oral   SpO2: 96% 97% 97% 96%  Weight:   211 lb 10.3 oz (96 kg)   Height:        Intake/Output Summary (Last 24 hours) at 02/05/2018 1207 Last data filed at 02/04/2018 2030 Gross per 24 hour  Intake 460 ml  Output -  Net 460 ml   Filed Weights   02/03/18 0031 02/04/18 0500 02/05/18 0442  Weight: 208 lb  (94.3 kg) 221 lb 5.5 oz (100.4 kg) 211 lb 10.3 oz (96 kg)    Telemetry    NSR - Personally Reviewed  ECG      Physical Exam   GEN: No acute distress.   Neck: No JVD Cardiac: RRR, no murmurs, rubs, or gallops.  Respiratory: Clear to auscultation bilaterally. GI: Soft, nontender, non-distended  MS: No edema; No deformity. Left wrist intact, no hematoma Neuro:  Nonfocal  Psych: Normal affect   Labs    Chemistry Recent Labs  Lab 02/03/18 0050 02/03/18 0410  NA 140 139  K 3.9 4.2  CL 104 104  CO2 22 21*  GLUCOSE 365* 352*  BUN 17 19  CREATININE 1.27* 1.20*  CALCIUM 9.4 9.5  PROT 7.2  --   ALBUMIN 3.4*  --   AST 69*  --   ALT 44  --   ALKPHOS 54  --   BILITOT 0.8  --   GFRNONAA 43* 46*  GFRAA 50* 54*  ANIONGAP 14 14     Hematology Recent Labs  Lab 02/03/18 0410 02/04/18 0733 02/05/18 0325  WBC 6.5 6.0 5.4  RBC  4.16 3.99 3.70*  HGB 12.7 12.1 11.4*  HCT 39.7 38.0 36.1  MCV 95.4 95.2 97.6  MCH 30.5 30.3 30.8  MCHC 32.0 31.8 31.6  RDW 14.3 14.6 14.7  PLT 217 209 183    Cardiac Enzymes Recent Labs  Lab 02/03/18 0050  TROPONINI 0.34*    Recent Labs  Lab 02/03/18 0053 02/03/18 0540  TROPIPOC 0.54* 3.72*     BNP Recent Labs  Lab 02/03/18 0050  BNP 60.8     DDimer No results for input(s): DDIMER in the last 168 hours.   Radiology    No results found.  Cardiac Studies   Cath films reviewed  Patient Profile     67 y.o. female with NSTEMI, occluded distal LAD.  Assessment & Plan    1) CAD/NSTEMI: OK to discharge today.  COntinue DAPT along with aggressive secondary prevention.  Will need case manager to help with transfer back to facility.  For questions or updates, please contact [facility] Please consult [email or web address] for contact info under Cardiology/STEMI.      Signed, Larae Grooms, MD  02/05/2018, 12:07 PM

## 2018-02-05 NOTE — Progress Notes (Signed)
Patient will discharge back to [facility]. Anticipated discharge date: 02/05/18 Family notified: patient informed her daughter Transportation by: [facility]  Nurse to call report to [phone]. Patient will go back to room 708A at the facility.   CSW signing off.  Estanislado Emms, [place]  Clinical Social Worker

## 2018-02-05 NOTE — NC FL2 (Signed)
[place] LEVEL OF CARE SCREENING TOOL     IDENTIFICATION  Patient Name: Danielle Vargas Birthdate: 1951/05/31 Sex: female Admission Date (Current Location): 02/03/2018  [facility] and [place] Number:  Herbalist and Address:  [facility], [place] [address] [place], [place] [address]      Provider Number: [id number]  Attending Physician Name and Address:  Sueanne Margarita, MD  Relative Name and Phone Number:  Riki Rusk, daughter, [phone]    Current Level of Care: Hospital Recommended Level of Care: [place] Prior Approval Number:    Date Approved/Denied:   PASRR Number: [id number]  Discharge Plan: SNF    Current Diagnoses: Patient Active Problem List   Diagnosis Date Noted  . Pressure injury of skin 02/04/2018  . Acute myocardial infarction ([place]) 02/03/2018  . Pre-syncope 11/28/2016  . Chronic combined systolic and diastolic heart failure ([place]) 11/19/2016  . ST elevation myocardial infarction (STEMI) of inferolateral wall ([place]) 11/19/2016  . 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 Left spastic hemiparesis ([place]) 07/15/2016  . Bursitis of right hip 04/01/2016  . Vascular headache   . Right middle cerebral artery stroke ([place]) 03/14/2016  . Asthma   . Benign essential HTN   . S/P rotator cuff repair   . Acute right MCA stroke ([place])   . Back pain   . Ischemic stroke ([place]) 03/10/2016  . Stroke (cerebrum) ([place]) 03/10/2016  . Hypothyroidism   . 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 UTI (urinary tract infection) 06/16/2015  . Family hx of colon cancer 04/17/2015  . OSA on CPAP 12/08/2014  . Retinopathy due to secondary diabetes mellitus, without macular edema, with moderate nonproliferative retinopathy ([place]) 09/18/2014  . CAD in native artery 01/23/2014  . Morbid obesity ([place]) 01/23/2014  .  Renal insufficiency 11/17/2013  . HTN (hypertension) 10/29/2013  . Chronic diastolic heart failure ([place]) 10/29/2013  . 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 LADA (latent autoimmune diabetes in adults), managed as type 1 ([place]) 08/08/2013    [place] RESPIRATION BLADDER Height & Weight     Self, Time, Situation, Place  Normal, Other (Comment)(CPAP) Incontinent, External catheter Weight: 211 lb 10.3 oz (96 kg) Height:  5\' 1"  (154.9 cm)  BEHAVIORAL SYMPTOMS/MOOD NEUROLOGICAL BOWEL NUTRITION STATUS      Incontinent Diet(please see DC summary)  AMBULATORY STATUS COMMUNICATION OF NEEDS Skin   Extensive Assist(baseline) Verbally PU Stage and Appropriate Care, Other (Comment)(PU II bilateral sit bones, PU II L hip, open wound L knee)                       Personal Care Assistance Level of Assistance  Bathing, Feeding, Dressing(baseline) Bathing Assistance: Limited assistance Feeding assistance: Independent Dressing Assistance: Limited assistance     Functional Limitations Info  Sight, Hearing, Speech Sight Info: Adequate Hearing Info: Adequate Speech Info: Adequate    SPECIAL CARE FACTORS FREQUENCY                       Contractures Contractures Info: Not present    Additional Factors Info  Code Status, Allergies, Insulin Sliding Scale Code Status Info: Full Allergies Info: Invokana Canagliflozin, Nsaids, Penicillins   Insulin Sliding Scale Info: novolog 3x/day with meals and at bedtime, lantus 2x/day; please see DC summary for dosing       Current Medications (02/05/2018):  This is the  current hospital active medication list Current Facility-Administered Medications  Medication Dose Route Frequency Provider Last Rate Last Dose  . 0.9 %  sodium chloride infusion  250 mL Intravenous PRN Jettie Booze, MD      . acetaminophen (TYLENOL) tablet 650 mg  650 mg Oral Q4H PRN Jettie Booze, MD   650 mg at 02/04/18 1954  . albuterol (PROVENTIL) (2.5  MG/3ML) 0.083% nebulizer solution 2.5 mg  2.5 mg Inhalation Q6H PRN Jettie Booze, MD      . aspirin chewable tablet 81 mg  81 mg Oral Daily Jettie Booze, MD   81 mg at 02/05/18 0809  . carvedilol (COREG) tablet 6.25 mg  6.25 mg Oral BID Jettie Booze, MD   6.25 mg at 02/05/18 0810  . clopidogrel (PLAVIX) tablet 75 mg  75 mg Oral Q breakfast Jettie Booze, MD   75 mg at 02/05/18 0810  . docusate sodium (COLACE) capsule 100 mg  100 mg Oral BID Jettie Booze, MD   100 mg at 02/05/18 0809  . fenofibrate tablet 160 mg  160 mg Oral QHS Jettie Booze, MD   160 mg at 02/04/18 2333  . fluticasone (FLONASE) 50 MCG/ACT nasal spray 2 spray  2 spray Each Nare Daily Jettie Booze, MD   2 spray at 02/05/18 0810  . furosemide (LASIX) tablet 40 mg  40 mg Oral Lorie Phenix, MD   40 mg at 02/05/18 [phone]  . gabapentin (NEURONTIN) capsule 300 mg  300 mg Oral BID Jettie Booze, MD   300 mg at 02/05/18 0809  . guaiFENesin tablet 600 mg  600 mg Oral BID PRN Jettie Booze, MD      . insulin aspart (novoLOG) injection 0-20 Units  0-20 Units Subcutaneous TID WC Jettie Booze, MD   4 Units at 02/05/18 [phone]  . insulin aspart (novoLOG) injection 0-5 Units  0-5 Units Subcutaneous QHS Jettie Booze, MD   2 Units at 02/04/18 2331  . insulin aspart (novoLOG) injection 55 Units  55 Units Subcutaneous QAC supper Jettie Booze, MD   55 Units at 02/04/18 1712  . insulin aspart (novoLOG) injection 70 Units  70 Units Subcutaneous BID WC Jettie Booze, MD   70 Units at 02/05/18 [phone]  . insulin glargine (LANTUS) injection 45 Units  45 Units Subcutaneous BID Jettie Booze, MD   45 Units at 02/05/18 [phone]  . isosorbide mononitrate (IMDUR) 24 hr tablet 30 mg  30 mg Oral Daily Jettie Booze, MD   30 mg at 02/05/18 0810  . levothyroxine (SYNTHROID, LEVOTHROID) tablet 100 mcg  100 mcg Oral QAC breakfast Jettie Booze, MD    100 mcg at 02/05/18 0810  . metoCLOPramide (REGLAN) tablet 10 mg  10 mg Oral BID Jettie Booze, MD   10 mg at 02/05/18 0809  . mometasone-formoterol (DULERA) 200-5 MCG/ACT inhaler 2 puff  2 puff Inhalation BID Jettie Booze, MD   2 puff at 02/04/18 0719  . montelukast (SINGULAIR) tablet 10 mg  10 mg Oral QHS Jettie Booze, MD   10 mg at 02/04/18 2333  . multivitamin with minerals tablet 1 tablet  1 tablet Oral Daily Jettie Booze, MD   1 tablet at 02/05/18 [phone]  . nitroGLYCERIN (NITROSTAT) SL tablet 0.4 mg  0.4 mg Sublingual Q5 min PRN Jettie Booze, MD   0.4 mg at 02/03/18 0139  . ondansetron (ZOFRAN) injection 4  mg  4 mg Intravenous Q6H PRN Jettie Booze, MD      . oxyCODONE (Oxy IR/ROXICODONE) immediate release tablet 5-10 mg  5-10 mg Oral Q4H PRN Jettie Booze, MD      . pantoprazole (PROTONIX) EC tablet 40 mg  40 mg Oral Daily Jettie Booze, MD   40 mg at 02/05/18 0809  . polyethylene glycol (MIRALAX / GLYCOLAX) packet 17 g  17 g Oral Beverly Gust Jettie Booze, MD   17 g at 02/05/18 5638  . rosuvastatin (CRESTOR) tablet 20 mg  20 mg Oral Daily Jettie Booze, MD   20 mg at 02/05/18 0809  . sodium chloride flush (NS) 0.9 % injection 3 mL  3 mL Intravenous Q12H Jettie Booze, MD   3 mL at 02/04/18 2334  . sodium chloride flush (NS) 0.9 % injection 3 mL  3 mL Intravenous PRN Jettie Booze, MD      . tiZANidine (ZANAFLEX) tablet 2 mg  2 mg Oral Q8H PRN Jettie Booze, MD   2 mg at 02/05/18 0808  . topiramate (TOPAMAX) tablet 50 mg  50 mg Oral BID Jettie Booze, MD   50 mg at 02/05/18 9373     Discharge Medications: Please see discharge summary for a list of discharge medications.  Relevant Imaging Results:  Relevant Lab Results:   Additional Information SSN: [id number]  Estanislado Emms, LCSW

## 2018-02-09 NOTE — Consult Note (Signed)
            [facility] CM Primary Care Navigator  02/09/2018  Declyn Offield 03-30-1951 [id number]   Went to seepatient at the bedside to identify possible discharge needsbutshe was already discharged.  Patient was discharged back to skilled nursing facility (SNF).  Per chart review, patient is a long term care resident at [facility].     Per MD note, patient presented with acute onset central chest and right arm pain, underwent cardiac catheterization. (acute myocardial infarction)  Patient has discharge instruction to follow-up with cardiology on 02/10/18.   For additional questions please contact:  Edwena Felty A. Khalel Alms, BSN, RN-BC [facility] PRIMARY CARE Navigator Cell: [phone]

## 2018-02-10 ENCOUNTER — Telehealth: Payer: Self-pay | Admitting: *Deleted

## 2018-02-10 ENCOUNTER — Encounter (INDEPENDENT_AMBULATORY_CARE_PROVIDER_SITE_OTHER): Payer: Self-pay

## 2018-02-10 ENCOUNTER — Ambulatory Visit (INDEPENDENT_AMBULATORY_CARE_PROVIDER_SITE_OTHER): Payer: Medicare Other | Admitting: Nurse Practitioner

## 2018-02-10 ENCOUNTER — Encounter: Payer: Self-pay | Admitting: Nurse Practitioner

## 2018-02-10 VITALS — BP 130/58 | HR 74 | Ht 61.0 in

## 2018-02-10 DIAGNOSIS — I639 Cerebral infarction, unspecified: Secondary | ICD-10-CM

## 2018-02-10 DIAGNOSIS — I2510 Atherosclerotic heart disease of native coronary artery without angina pectoris: Secondary | ICD-10-CM | POA: Diagnosis not present

## 2018-02-10 DIAGNOSIS — I214 Non-ST elevation (NSTEMI) myocardial infarction: Secondary | ICD-10-CM

## 2018-02-10 DIAGNOSIS — I5042 Chronic combined systolic (congestive) and diastolic (congestive) heart failure: Secondary | ICD-10-CM | POA: Diagnosis not present

## 2018-02-10 NOTE — Progress Notes (Signed)
CARDIOLOGY OFFICE NOTE  Date:  02/10/2018    [name] Date of Birth: [birth year] Medical Record #[id number]  PCP:  [name], MD  Cardiologist:  Jennings Books    Chief Complaint  Patient presents with  . Coronary Artery Disease    8 month check - seen for Dr. Tamala Julian    History of Present Illness: Danielle Vargas is a 67 y.o. female who presents today for a follow up visit. Seen for Dr. Tamala Julian. This is an approximate 8 month check as well as post hospital visit. This was to be a TOC - however no phone call documented.   She has a hx of CAD, ischemic cardiomyopathy, OSA, prior CVA, IDDM, HTN, &HLD. 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 This was treated with angioplasty. EF was 35-40% at the time. Follow-up echo 5/15 demonstrated improved LV function with an EF of 55%. Represented last March of 2018 with acute occlusion of the circumflex which was treated with angioplasty. There was acute decrease in LV systolic function to less than 25%.  Most recent LVEF was 65% June 2018.  Last seen by Dr. Tamala Julian back in October - she was here with her daughter - some concern for intermittent dyspnea but overall felt to be stable. She resides in SNF.   She presented last week with a 2 hour episode of chest pain while at rest - similar to prior MI. She was brought to the ER - concern for abnormal EKG but STEMI was cancelled on her arrival. She was treated with sl NTG. She did end up being recathed - see below - medical management to continue. She was sent back to [facility].   Comes in today. Here with her caregiver from [facility]. She cannot stand to weigh. She is in a wheelchair. She has had no more chest pain. She is not short of breath. She cannot walk. She mostly sits and watches TV all day.   Past Medical History:  Diagnosis Date  . Acute combined systolic and  diastolic heart failure ([place]) 11/19/2016  . Allergy   . Asthma   . CAD (coronary artery disease)    a. s/p lateral STEMI in 2015 2ry to occlusion of OM1 thought to be caused by coronary artery dissection, treated with POBA. b. 11/2016: STEMI with thrombotic occlusion of distal LCx which was treated with balloon angioplasty.   . Cataract    "just beginning"  . Colon polyps 7/07   hyperplastic and adenomatous  . Depression    pt unsure, psychologist said no  . Diverticulosis   . DM (diabetes mellitus) ([place])   . Dyslipidemia   . External hemorrhoid   . Fatty liver   . Gastroparesis   . HTN (hypertension)   . Hyperlipidemia   . 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 Hypothyroidism   . Migraine   . Myocardial infarction ([place])    FEb 2015  . Nephrolithiasis   . Obesity   . Osteoporosis   . Sleep apnea    wears CPAP  . ST elevation myocardial infarction (STEMI) of inferolateral wall ([place]) 11/19/2016  . Stroke [facility])     Past Surgical History:  Procedure Laterality Date  . BREAST REDUCTION SURGERY  '88  . CARPAL TUNNEL RELEASE  2000   right wrist  . CERVICAL DISC SURGERY  '98   fusion  .  CESAREAN SECTION  '79  '84  . COLONOSCOPY    . CORONARY BALLOON ANGIOPLASTY N/A 11/19/2016   Procedure: Coronary Balloon Angioplasty;  Surgeon: Sherren Mocha, MD;  Location: [place] CV LAB;  Service: Cardiovascular;  Laterality: N/A;  . EP IMPLANTABLE DEVICE N/A 03/12/2016   Procedure: Loop Recorder Insertion;  Surgeon: Will Meredith Leeds, MD;  Location: [place] CV LAB;  Service: Cardiovascular;  Laterality: N/A;  . LEFT HEART CATH AND CORONARY ANGIOGRAPHY N/A 11/19/2016   Procedure: Left Heart Cath and Coronary Angiography;  Surgeon: Sherren Mocha, MD;  Location: [place] CV LAB;  Service: Cardiovascular;  Laterality: N/A;  . LEFT HEART CATH AND CORONARY ANGIOGRAPHY N/A 02/03/2018   Procedure: LEFT HEART CATH AND CORONARY ANGIOGRAPHY;  Surgeon: Belva Crome, MD;  Location: [place] CV LAB;  Service: Cardiovascular;  Laterality: N/A;  . LEFT HEART CATHETERIZATION WITH CORONARY ANGIOGRAM N/A 10/26/2013   Procedure: LEFT HEART CATHETERIZATION WITH CORONARY ANGIOGRAM;  Surgeon: Sinclair Grooms, MD;  Location: [facility] CATH LAB;  Service: Cardiovascular;  Laterality: N/A;  . ROTATOR CUFF REPAIR  '09   left shoulder  . TEE WITHOUT CARDIOVERSION N/A 03/12/2016   Procedure: TRANSESOPHAGEAL ECHOCARDIOGRAM (TEE);  Surgeon: Sanda Klein, MD;  Location: [facility] ENDOSCOPY;  Service: Cardiovascular;  Laterality: N/A;  . TUBAL LIGATION  '85  . ULNAR NERVE REPAIR  '02, '08   left done, then right     Medications: Current Meds  Medication Sig  . acetaminophen (TYLENOL) 325 MG tablet Take 650 mg by mouth every 6 (six) hours as needed for mild pain.   Marland Kitchen albuterol (PROVENTIL HFA;VENTOLIN HFA) 108 (90 BASE) MCG/ACT inhaler Inhale 2 puffs into the lungs every 6 (six) hours as needed for wheezing or shortness of breath.   Marland Kitchen aspirin EC 81 MG EC tablet Take 1 tablet (81 mg total) by mouth daily.  . budesonide-formoterol (SYMBICORT) 160-4.5 MCG/ACT inhaler Inhale 1 puff into the lungs 2 (two) times daily.   . carvedilol (COREG) 6.25 MG tablet Take 6.25 mg by mouth 2 (two) times daily with a meal.  . clopidogrel (PLAVIX) 75 MG tablet Take 1 tablet (75 mg total) by mouth daily with breakfast.  . docusate sodium (COLACE) 100 MG capsule Take 100 mg by mouth 2 (two) times daily.  . fenofibrate 160 MG tablet Take 160 mg by mouth at bedtime.   . furosemide (LASIX) 40 MG tablet Take 40 mg by mouth every morning.  . gabapentin (NEURONTIN) 300 MG capsule Take 300 mg by mouth 2 (two) times daily.   Marland Kitchen guaiFENesin 200 MG tablet Take 600 mg by mouth 2 (two) times daily as needed for cough or to loosen phlegm.  . insulin glargine (LANTUS) 100 UNIT/ML injection Inject 50 units in am and 45 units at bedtime. (Patient taking differently: Inject 80 Units into the skin 2 (two) times daily. )  . insulin regular  (NOVOLIN R,HUMULIN R) 100 units/mL injection Inject 55-70 Units into the skin 3 (three) times daily before meals. Inject 70 units into the skin before breakfast, 70 units into the skin before lunch, and 55 units into the skin before dinner.  . isosorbide mononitrate (IMDUR) 30 MG 24 hr tablet Take 30 mg by mouth daily.  Marland Kitchen levothyroxine (SYNTHROID, LEVOTHROID) 100 MCG tablet Take 1 tablet (100 mcg total) by mouth daily before breakfast.  . metoCLOPramide (REGLAN) 10 MG tablet Take 10 mg by mouth 2 (two) times daily.   . mometasone (NASONEX) 50 MCG/ACT nasal spray Place  2 sprays into the nose daily.  . montelukast (SINGULAIR) 10 MG tablet Take 10 mg by mouth at bedtime.  . Multiple Vitamin (MULTIVITAMIN WITH MINERALS) TABS tablet Take 1 tablet by mouth daily. One A Day 50+  . nitroGLYCERIN (NITROSTAT) 0.4 MG SL tablet Place 1 tablet (0.4 mg total) under the tongue every 5 (five) minutes as needed for chest pain.  . OXYGEN Inhale 2 L into the lungs at bedtime as needed (dyspnea).   . pantoprazole (PROTONIX) 40 MG tablet Take 1 tablet (40 mg total) by mouth daily.  . polyethylene glycol (MIRALAX / GLYCOLAX) packet Take 17 g by mouth every other day.   . rosuvastatin (CRESTOR) 20 MG tablet Take 20 mg by mouth daily.   Marland Kitchen tiZANidine (ZANAFLEX) 2 MG tablet Take 2 mg by mouth every 8 (eight) hours as needed for muscle spasms.   Marland Kitchen topiramate (TOPAMAX) 50 MG tablet Take 50 mg by mouth 2 (two) times daily.   Current Facility-Administered Medications for the 02/10/18 encounter (Office Visit) with Burtis Junes, NP  Medication  . mupirocin cream (BACTROBAN) 2 %     Allergies: Allergies  Allergen Reactions  . Invokana [Canagliflozin] Diarrhea  . Nsaids Other (See Comments)    GI problems/ stomach pain  . Penicillins Rash    Has patient had a PCN reaction causing immediate rash, facial/tongue/throat swelling, SOB or lightheadedness with hypotension: No (rash on legs after 3 days [place] -June 2016) Has  patient had a PCN reaction causing severe rash involving mucus membranes or skin necrosis: No Has patient had a PCN reaction that required hospitalization No Has patient had a PCN reaction occurring within the last 10 years: Yes If all of the above answers are "NO", then may proceed with Cephalosporin use.    Social History: The patient  reports that she has never smoked. She has never used smokeless tobacco. She reports that she does not drink alcohol or use drugs.   Family History: The patient's family history includes COPD in her father; Cirrhosis in her sister; Colon cancer (age of onset: 72) in her unknown relative; Colon cancer (age of onset: 8) in her paternal aunt; Diabetes in her brother and mother; Emphysema in her father; Heart attack in her brother, mother, and paternal grandfather; Heart failure in her brother and mother; Hypertension in her brother; Kidney disease in her mother; Liver disease in her sister; Lung cancer in her brother; Other in her unknown relative; Stroke in her father.   Review of Systems: Please see the history of present illness.   Otherwise, the review of systems is positive for none.   All other systems are reviewed and negative.   Physical Exam: VS:  BP (!) 130/58 (BP Location: Right Arm, Patient Position: Sitting, Cuff Size: Large) Comment (BP Location): L arm does not move  Pulse 74   Ht 5\' 1"  (1.549 m)   SpO2 96% Comment: at rest  BMI 39.99 kg/m  .  BMI Body mass index is 39.99 kg/m.  Wt Readings from Last 3 Encounters:  02/05/18 211 lb 10.3 oz (96 kg)  12/24/17 208 lb (94.3 kg)  07/07/17 215 lb (97.5 kg)    General: Elderly. She looks chronically ill. She has left sided weakness. She is in no acute distress.   HEENT: Normal.  Neck: Supple, no JVD, carotid bruits, or masses noted.  Cardiac: Regular rate and rhythm. Heart tones are distant. Legs are full but no real significant edema.  Respiratory:  Lungs are  clear to auscultation bilaterally  with normal work of breathing.  GI: Soft and nontender.  MS: No deformity or atrophy. Gait not tested.  Skin: Warm and dry. Color is normal.  Neuro:  Strength and sensation are intact and no gross focal deficits noted.  Psych: Alert, appropriate and with normal affect.   LABORATORY DATA:  EKG:  EKG is not ordered today.  Lab Results  Component Value Date   WBC 5.4 02/05/2018   HGB 11.4 (L) 02/05/2018   HCT 36.1 02/05/2018   PLT 183 02/05/2018   GLUCOSE 352 (H) 02/03/2018   CHOL 112 02/03/2018   TRIG 263 (H) 02/03/2018   HDL 19 (L) 02/03/2018   LDLCALC 40 02/03/2018   ALT 44 02/03/2018   AST 69 (H) 02/03/2018   NA 139 02/03/2018   K 4.2 02/03/2018   CL 104 02/03/2018   CREATININE 1.20 (H) 02/03/2018   BUN 19 02/03/2018   CO2 21 (L) 02/03/2018   TSH 3.969 02/03/2018   INR 1.28 02/03/2018   HGBA1C 8.8% 12/31/2017     BNP (last 3 results) Recent Labs    02/03/18 0050  BNP 60.8    ProBNP (last 3 results) Recent Labs    07/07/17 1156  PROBNP 298*     Other Studies Reviewed Today: LEFT HEART CATH AND CORONARY ANGIOGRAPHY  02/03/18  Conclusion    Coronary angiography only.  Total occlusion of the mid to distal LAD at the apex. Collaterals are present left and left.  Normal left main  Proximal and mid LAD is widely patent with luminal irregularities up to 40%.  Circumflex is widely patent. The previously occluded second obtuse marginal has recanalized.  Due to technical difficulties from the left radial, the case was terminated prematurely but with what I believe is adequate information to provide direction for management.  RECOMMENDATIONS:   Aspirin and Plavix for at least 30 days and perhaps 6 to 12 months.  Risk factor modification.  If recurrent angina could consider cardiac cath from the femoral approach however both groin contains significant fungal/candidal infection.  Very small right radial artery prevented cath from that  approach.  2 g of Ancef given because of difficulty with maintaining sterility in the left arm.   Diagnostic Diagram        Echo Study Conclusions 01/2018  - Left ventricle: The cavity size was normal. Systolic function was   normal. The estimated ejection fraction was in the range of 60%   to 65%. Hypokinesis of the apical myocardium. Doppler parameters   are consistent with abnormal left ventricular relaxation (grade 1   diastolic dysfunction). - Aortic valve: Transvalvular velocity was within the normal range.   There was no stenosis. There was no regurgitation. Valve area   (VTI): 1.72 cm^2. Valve area (Vmax): 1.46 cm^2. Valve area   (Vmean): 1.43 cm^2. - Mitral valve: Transvalvular velocity was within the normal range.   There was no evidence for stenosis. There was no regurgitation.   Valve area by pressure half-time: 1.24 cm^2. - Right ventricle: The cavity size was normal. Wall thickness was   normal. Systolic function was normal. - Tricuspid valve: There was no regurgitation. - Pericardium, extracardiac: A small pericardial effusion was   identified.  Impressions:  - Compared with the echo 02/2017, apical hypokinesis is new.  Assessment & Plan:  1. CAD with recent NSTEMI - s/p cath - this showed total occlusion of mid to distal LAD with left to left collaterals. Mild disease otherwise.  Recommended medical therapy. Very small right radial artery resultied in premature termination of cath but had adequate data. If recurrent angina could consider cardiac cath from the femoral approach however both groin contains significant fungal/candidal infection. She will continue on long term DAPT with aspirin/Plavix. She has had no further chest pain. Would favor continued medical management. No changes made today.   2. Hx of ICM - most recent echo with EF of 60 to 65% - grade 1 DD and apical akinesis - she has no current symptoms. No change with current regimen.   3. HLD -  she in on statin - recent labs noted  4. Prior stroke - she is pretty much wheelchair bound.   5. DM - uncontrolled. Plan per house staff.   Current medicines are reviewed with the patient today.  The patient does not have concerns regarding medicines other than what has been noted above.  The following changes have been made:  See above.  Labs/ tests ordered today include:   No orders of the defined types were placed in this encounter.    Disposition:   FU with me in about 9 months. Will be available as needed.    Patient is agreeable to this plan and will call if any problems develop in the interim.   SignedTruitt Merle, NP  02/10/2018 12:00 PM  [facility] [address] [place], [place]  [address] Phone: [phone] Fax: [phone]

## 2018-02-10 NOTE — Patient Instructions (Signed)
We will be checking the following labs today - NONE   Medication Instructions:    Continue with your current medicines.     Testing/Procedures To Be Arranged:  N/A  Follow-Up:   See me in about 9 months    Other Special Instructions:   N/A    If you need a refill on your cardiac medications before your next appointment, please call your pharmacy.   Call the [place] office at [phone] if you have any questions, problems or concerns.

## 2018-02-10 NOTE — Telephone Encounter (Signed)
S/w Otila Kluver @ [phone] at [facility] skilled nursing home too get fax # [phone], faxed ov note from today's visit through system.

## 2018-02-11 ENCOUNTER — Telehealth: Payer: Self-pay | Admitting: Nurse Practitioner

## 2018-02-11 NOTE — Telephone Encounter (Signed)
Danielle Vargas is calling from [facility] [employer] and stated they received a summary of visit and has question about the pt medication for Bactroban. They didn't have any instructions on how to give it to pt. Please call to advixe

## 2018-02-11 NOTE — Telephone Encounter (Signed)
Informed Casey at [facility] that patient was not prescribed Bactroban at our office, and per patient's chart this was given in clinic by a Dr. Amalia Hailey, Podiatry, in 03/2017 for an ingrown toenail. Myriam Jacobson verbalized understanding.

## 2018-02-15 ENCOUNTER — Ambulatory Visit (INDEPENDENT_AMBULATORY_CARE_PROVIDER_SITE_OTHER): Payer: Medicare Other | Admitting: *Deleted

## 2018-02-15 DIAGNOSIS — I639 Cerebral infarction, unspecified: Secondary | ICD-10-CM

## 2018-02-15 NOTE — Progress Notes (Signed)
[facility] Summary Report / Loop Recorder 

## 2018-02-18 ENCOUNTER — Encounter: Payer: Self-pay | Admitting: Physical Medicine & Rehabilitation

## 2018-02-18 ENCOUNTER — Encounter: Payer: Medicare Other | Attending: Physical Medicine & Rehabilitation

## 2018-02-18 ENCOUNTER — Ambulatory Visit (HOSPITAL_BASED_OUTPATIENT_CLINIC_OR_DEPARTMENT_OTHER): Payer: Medicare Other | Admitting: Physical Medicine & Rehabilitation

## 2018-02-18 VITALS — BP 115/75 | HR 79 | Resp 14

## 2018-02-18 DIAGNOSIS — E669 Obesity, unspecified: Secondary | ICD-10-CM | POA: Insufficient documentation

## 2018-02-18 DIAGNOSIS — G8110 Spastic hemiplegia affecting unspecified side: Secondary | ICD-10-CM | POA: Diagnosis not present

## 2018-02-18 DIAGNOSIS — Z5189 Encounter for other specified aftercare: Secondary | ICD-10-CM | POA: Insufficient documentation

## 2018-02-18 DIAGNOSIS — E785 Hyperlipidemia, unspecified: Secondary | ICD-10-CM | POA: Diagnosis not present

## 2018-02-18 DIAGNOSIS — I10 Essential (primary) hypertension: Secondary | ICD-10-CM | POA: Diagnosis not present

## 2018-02-18 DIAGNOSIS — E119 Type 2 diabetes mellitus without complications: Secondary | ICD-10-CM | POA: Diagnosis not present

## 2018-02-18 DIAGNOSIS — I69354 Hemiplegia and hemiparesis following cerebral infarction affecting left non-dominant side: Secondary | ICD-10-CM | POA: Insufficient documentation

## 2018-02-18 DIAGNOSIS — G8929 Other chronic pain: Secondary | ICD-10-CM | POA: Insufficient documentation

## 2018-02-18 NOTE — Patient Instructions (Signed)

## 2018-02-18 NOTE — Progress Notes (Signed)
Botox Injection for spasticity using needle EMG guidance  Dilution: 50 Units/ml Indication: Severe spasticity which interferes with ADL,mobility and/or  hygiene and is unresponsive to medication management and other conservative care Informed consent was obtained after describing risks and benefits of the procedure with the patient. This includes bleeding, bruising, infection, excessive weakness, or medication side effects. A REMS form is on file and signed. Needle: 27g 1 needle electrode Number of units per muscle Biceps25 Brachiorad 50 FCR25 FCU25 FDS25 FDP0,  pronator teres 50 Pronator quadratus, 50  All injections were done after obtaining appropriate EMG activity and after negative drawback for blood. The patient tolerated the procedure well. Post procedure instructions were given. A followup appointment was made.

## 2018-03-08 ENCOUNTER — Ambulatory Visit: Payer: Medicare Other | Admitting: Neurology

## 2018-03-16 ENCOUNTER — Ambulatory Visit: Payer: Medicare Other | Admitting: Neurology

## 2018-03-18 ENCOUNTER — Ambulatory Visit (INDEPENDENT_AMBULATORY_CARE_PROVIDER_SITE_OTHER): Payer: Medicare Other | Admitting: *Deleted

## 2018-03-18 DIAGNOSIS — I639 Cerebral infarction, unspecified: Secondary | ICD-10-CM | POA: Diagnosis not present

## 2018-03-18 NOTE — Progress Notes (Signed)
[facility] Summary Report / Loop Recorder 

## 2018-03-23 LAB — CUP PACEART REMOTE DEVICE CHECK
Date Time Interrogation Session: 20190608140546
Implantable Pulse Generator Implant Date: 20170705

## 2018-03-25 ENCOUNTER — Ambulatory Visit (INDEPENDENT_AMBULATORY_CARE_PROVIDER_SITE_OTHER): Payer: Medicare Other | Admitting: Neurology

## 2018-03-25 ENCOUNTER — Encounter: Payer: Self-pay | Admitting: Neurology

## 2018-03-25 VITALS — BP 116/64 | Ht 61.0 in | Wt 201.0 lb

## 2018-03-25 DIAGNOSIS — I639 Cerebral infarction, unspecified: Secondary | ICD-10-CM | POA: Diagnosis not present

## 2018-03-25 DIAGNOSIS — I5032 Chronic diastolic (congestive) heart failure: Secondary | ICD-10-CM

## 2018-03-25 DIAGNOSIS — Z9981 Dependence on supplemental oxygen: Secondary | ICD-10-CM

## 2018-03-25 DIAGNOSIS — G4733 Obstructive sleep apnea (adult) (pediatric): Secondary | ICD-10-CM

## 2018-03-25 DIAGNOSIS — I63511 Cerebral infarction due to unspecified occlusion or stenosis of right middle cerebral artery: Secondary | ICD-10-CM

## 2018-03-25 DIAGNOSIS — G4734 Idiopathic sleep related nonobstructive alveolar hypoventilation: Secondary | ICD-10-CM | POA: Diagnosis not present

## 2018-03-25 NOTE — Progress Notes (Signed)
SLEEP MEDICINE CLINIC   Provider:  Larey Seat, M D  Referring Provider: Marton Redwood, MD Primary Care Physician:  [name], MD  Chief Complaint  Patient presents with  . Follow-up    pt with daughter. pulled a 90 day download and she has used 3/90 days. DME [facility]. pt states that she is sleeping well. she wears her oxygen through night but states that she doesnt like how water builds up in her cpap machine.     HPI:  Danielle Vargas is a 67 y.o. female , seen here in company of her daughter and seated in a wheelchair.  The pateint suffered another myocardial infarct this year- sleeps with elevated chest, has orthopnea. She is not compliant with CPAP, but with nasal canula oxygen.  .   Danielle Vargas had been prescribed CPAP therapy after she suffered a stroke following a myocardial infarction. The patient had been tested in a SP:IT night PSG on 10-08-2014, AHI of 131/h !!!   She initially tolerated CPAP well she was using an AutoSet between 10 and 20 cm and had a compliance of roughly 80% last year.  Her residual AHI was not optimal at 6.3 last year however over the last 3 months has been very little use of her CPAP machine.  She reports that water collects in the air hose( the oxygen tube)  and she feels concerned that she may swallow  that condensation water - The easiest way to adjust is to reduce her humidifier setting so that she does not produce quite as much humidity.  Secondary she may limit her pressure range on her CPAP machine and this will also help her not to feel that she gets a over supply of push of air.  She has a minimum pressure of 10 maximum pressure of 20 and 3 cm EPR her residual AHI for this very sporadically used CPAP.  Was 12.9.  Most of these apneas are listed as unknown which means they are related to air leaks.  Her air leakage is very high the 95th percentile air leakage is 100 L a minute.  We would have to use a different interface or mask also to achieve a  better seal. She is using a nasal mask, no chin strap- Iearned that the patient uses a nasal canula while using nasal CPAP mask- that cannot work.   OXYGEN NEEDS TO BE BLED INTO THE CPAP MACHINE !!    Interval history from 03/03/2017. I have pleasure of seeing Danielle Vargas today in the presence of a caretaker. She had suffered a stroke following a myocardial infarction and has been on CPAP therapy. She tolerated CPAP well but I wanted to see if she may need a BiPAP setting. Be me today to see her how her compliance has been. She is using currently an auto set CPAP that allows a pressure between 10 and 20 cm water, she feels good with his machine she uses on average 4-1/2 hours at night her compliance by days is 80% but she has not managed every day to use it 4 hours or more. She has actively worked on not taking the mask off her face at night and seems to have done well over the last week or so. Her residual AHI is 6.3 and due to high air leaks there are probably less than 5 apneas left but these were erroneously counted. She is using oxygen to be bled into the CPAP and she uses this at the 95th percentile  pressure of 15 cm water. We need to improve compliance.      seen here as a revisit from Dr Brigitte Pulse and Dr Erlinda Hong, The patient has been last seen in April 2016 in the sleep clinic. She is followed for diabetes by Dr. Benjiman Core, Dr. Carmie Kanner is her primary care physician, who referred her originally to me because she was excessively daytime sleepy, had asthmatic pulmonary wheezing, short of breath, and she was morbidly obese. She was diagnosed with an allergy to cat hair. Her comorbidities and past medical history include neuropathy, nephropathy and gastroparesis due to diabetes mellitus, positive GAD antibodies, colon polyps, osteoporosis, nephrolithiasis, malignant hypertension and hyperlipidemia. Chronic depression, C-spine fusion and left rotator cuff repair surgeries. Severest Sleep  Apnea, hypoxemia, nocturia.  Epworth sleepiness score was endorsed at 18 points. Her sleep study revealed the highest AHI we had seen a long time at 142.9/ hr. . Sleep was not recorded in supine position.   CPAP was initiated at 6 and titrated to 15 cm water we put her on an autotitration. A nasal mask was fitted for the patient. By spring 2016 last year the patient was using her CPAP machine regularly. 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 Her speech is unaffected. She is alert and awake. She can give me details of her recent hospitalization. She confesses that she has not been compliant with CPAP for several months. She stopped regular using it by mid spring 2017, but used it again during a hospitalization in July 2017. My nurse found the last use of CPAP in July and August 2017. After her stroke she had to move to a nursing facility. She reports that she has support there - but she needs remind the caretakers to assist her with using the CPAP. She was not using CPAP at the time she suffered her stroke. She does blame herself for it. The very last use of CPAP according to her compliance data was in late July 2017 for 3 hours and 27 minutes with a residual AHI of 3.4 and a pressure of 18.6 cm water. Also she used to tolerate CPAP well, I think we may have to need to change her to a BiPAP machine.This is meant to give the patient comfort, her apneas are controlled on CPAP.    Dr. Erlinda Hong,  Due to the stroke and the left-sided hemiparesis there have been changes in her ability to turn in bed find a comfortable position she is now wheelchair bound., She can transfer with a cane.  Dr Read Drivers is treating her spasticity.  1) Danielle Vargas was diagnosed in early 2015 with coronary artery disease and had a myocardial infarction in February. Soon after she was  referred for a sleep study which was performed in Summer 2015 and she was diagnosed with one of the most severe apnea as I have seen. She was placed on an autotitrator CPAP with 3 cm EPR and initially tolerated the therapy very well. She became noncompliant over the month since April 2017, and suffered a stroke resulting in left hemiparesis and gait instability by July 1. She has been followed by my colleague Dr. Erlinda Hong. A neurovascular specialist.  I want urgently for the patient to resume her CPAP therapy and if she feels that she cannot tolerate the CPAP I would switch her  to BiPAP. Her last use the dates confirmed that the machine is still effective in reducing her AHI to 3.4, a very desirable result, given the high baseline.    Review of Systems: Out of a complete 14 system review, the patient complains of only the following symptoms, and all other reviewed systems are negative.    Social History   Socioeconomic History  . Marital status: Divorced    Spouse name: Not on file  . Number of children: 2  . Years of education: Not on file  . Highest education level: Not on file  Occupational History  . Occupation: disabled    Fish farm manager: UNEMPLOYED  Social Needs  . Financial resource strain: Not on file  . Food insecurity:    Worry: Not on file    Inability: Not on file  . Transportation needs:    Medical: Not on file    Non-medical: Not on file  Tobacco Use  . Smoking status: Never Smoker  . Smokeless tobacco: Never Used  Substance and Sexual Activity  . Alcohol use: No  . Drug use: No  . Sexual activity: Not Currently    Birth control/protection: Post-menopausal  Lifestyle  . Physical activity:    Days per week: Not on file    Minutes per session: Not on file  . Stress: Not on file  Relationships  . Social connections:    Talks on phone: Not on file    Gets together: Not on file    Attends religious service: Not on file    Active member of club or organization: Not on file     Attends meetings of clubs or organizations: Not on file    Relationship status: Not on file  . Intimate partner violence:    Fear of current or ex partner: Not on file    Emotionally abused: Not on file    Physically abused: Not on file    Forced sexual activity: Not on file  Other Topics Concern  . Not on file  Social History Narrative   Lives in [facility] alone.  Retired.  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   Regular exercise: none   Caffeine use: daily; dt coke          Family History  Problem Relation Age of Onset  . COPD Father   . Stroke Father   . Emphysema Father   . Heart failure Mother   . Heart attack Mother        several  . Diabetes Mother   . Kidney disease Mother   . Heart failure Brother   . Hypertension Brother   . Colon cancer Unknown 45       mat. 1st cousin  . Other Unknown        celiac sprue, 1/2 brother  . Diabetes Brother        x 3  . Lung cancer Brother   . Cirrhosis Sister        liver transplant  . Liver disease Sister        transplant  . Heart attack Paternal Grandfather   . Heart attack Brother   . Colon cancer Paternal Aunt 36  . Esophageal cancer Neg Hx   . Stomach cancer Neg Hx   . Rectal cancer Neg Hx     Past Medical History:  Diagnosis Date  . Acute combined systolic and diastolic heart failure ([place]) 11/19/2016  . Allergy   . Asthma   .  CAD (coronary artery disease)    a. s/p lateral STEMI in 2015 2ry to occlusion of OM1 thought to be caused by coronary artery dissection, treated with POBA. b. 11/2016: STEMI with thrombotic occlusion of distal LCx which was treated with balloon angioplasty.   . Cataract    "just beginning"  . Colon polyps 7/07   hyperplastic and adenomatous  . Depression    pt unsure, psychologist said no  . Diverticulosis   . DM (diabetes mellitus) ([place])   . Dyslipidemia   . External hemorrhoid   . Fatty liver   . Gastroparesis   . HTN (hypertension)   . Hyperlipidemia   .  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 Hypothyroidism   . Migraine   . Myocardial infarction ([place])    FEb 2015  . Nephrolithiasis   . Obesity   . Osteoporosis   . Sleep apnea    wears CPAP  . ST elevation myocardial infarction (STEMI) of inferolateral wall ([place]) 11/19/2016  . Stroke [facility])     Past Surgical History:  Procedure Laterality Date  . BREAST REDUCTION SURGERY  '88  . CARPAL TUNNEL RELEASE  2000   right wrist  . CERVICAL DISC SURGERY  '98   fusion  . CESAREAN SECTION  '79  '84  . COLONOSCOPY    . CORONARY BALLOON ANGIOPLASTY N/A 11/19/2016   Procedure: Coronary Balloon Angioplasty;  Surgeon: Sherren Mocha, MD;  Location: [place] CV LAB;  Service: Cardiovascular;  Laterality: N/A;  . EP IMPLANTABLE DEVICE N/A 03/12/2016   Procedure: Loop Recorder Insertion;  Surgeon: Will Meredith Leeds, MD;  Location: [place] [facility];  Service: Cardiovascular;  Laterality: N/A;  . LEFT HEART CATH AND CORONARY ANGIOGRAPHY N/A 11/19/2016   Procedure: Left Heart Cath and Coronary Angiography;  Surgeon: Sherren Mocha, MD;  Location: [place] [facility];  Service: Cardiovascular;  Laterality: N/A;  . LEFT HEART CATH AND CORONARY ANGIOGRAPHY N/A 02/03/2018   Procedure: LEFT HEART CATH AND CORONARY ANGIOGRAPHY;  Surgeon: Belva Crome, MD;  Location: [place] [facility] LAB;  Service: Cardiovascular;  Laterality: N/A;  . LEFT HEART CATHETERIZATION WITH CORONARY ANGIOGRAM N/A 10/26/2013   Procedure: LEFT HEART CATHETERIZATION WITH CORONARY ANGIOGRAM;  Surgeon: Sinclair Grooms, MD;  Location[facility] CATH LAB;  Service: Cardiovascular;  Laterality: N/A;  . ROTATOR CUFF REPAIR  '09   left shoulder  . TEE WITHOUT CARDIOVERSION N/A 03/12/2016   Procedure: TRANSESOPHAGEAL ECHOCARDIOGRAM (TEE);  Surgeon: Sanda Klein, MD;  Location: [facility] ENDOSCOPY;  Service: Cardiovascular;  Laterality: N/A;  . TUBAL LIGATION  '85  . ULNAR NERVE REPAIR  '02, '08   left done, then right    Current Outpatient  Medications  Medication Sig Dispense Refill  . acetaminophen (TYLENOL) 325 MG tablet Take 650 mg by mouth every 6 (six) hours as needed for mild pain.     Marland Kitchen albuterol (PROVENTIL HFA;VENTOLIN HFA) 108 (90 BASE) MCG/ACT inhaler Inhale 2 puffs into the lungs every 6 (six) hours as needed for wheezing or shortness of breath.     Marland Kitchen aspirin EC 81 MG EC tablet Take 1 tablet (81 mg total) by mouth daily.    . budesonide-formoterol (SYMBICORT) 160-4.5 MCG/ACT inhaler Inhale 1 puff into the lungs 2 (two) times daily.     . carvedilol (COREG) 6.25 MG tablet Take 6.25 mg by mouth 2 (two) times daily with a meal.    . clopidogrel (PLAVIX) 75 MG tablet Take 1 tablet (75 mg total) by mouth  daily with breakfast. 30 tablet 11  . docusate sodium (COLACE) 100 MG capsule Take 100 mg by mouth 2 (two) times daily.    . fenofibrate 160 MG tablet Take 160 mg by mouth at bedtime.     . furosemide (LASIX) 40 MG tablet Take 40 mg by mouth every morning.    . gabapentin (NEURONTIN) 300 MG capsule Take 300 mg by mouth 2 (two) times daily.     Marland Kitchen guaiFENesin 200 MG tablet Take 600 mg by mouth 2 (two) times daily as needed for cough or to loosen phlegm.    . insulin regular (NOVOLIN R,HUMULIN R) 100 units/mL injection Inject 55-70 Units into the skin 3 (three) times daily before meals. Inject 70 units into the skin before breakfast, 70 units into the skin before lunch, and 55 units into the skin before dinner.    . isosorbide mononitrate (IMDUR) 30 MG 24 hr tablet Take 30 mg by mouth daily.    Marland Kitchen levothyroxine (SYNTHROID, LEVOTHROID) 100 MCG tablet Take 1 tablet (100 mcg total) by mouth daily before breakfast.    . metoCLOPramide (REGLAN) 10 MG tablet Take 10 mg by mouth 2 (two) times daily.     . mometasone (NASONEX) 50 MCG/ACT nasal spray Place 2 sprays into the nose daily. 17 g 12  . montelukast (SINGULAIR) 10 MG tablet Take 10 mg by mouth at bedtime.    . Multiple Vitamin (MULTIVITAMIN WITH MINERALS) TABS tablet Take 1 tablet  by mouth daily. One A Day 50+    . nitroGLYCERIN (NITROSTAT) 0.4 MG SL tablet Place 1 tablet (0.4 mg total) under the tongue every 5 (five) minutes as needed for chest pain. 25 tablet 3  . OXYGEN Inhale 2 L into the lungs at bedtime as needed (dyspnea).     . pantoprazole (PROTONIX) 40 MG tablet Take 1 tablet (40 mg total) by mouth daily.    . polyethylene glycol (MIRALAX / GLYCOLAX) packet Take 17 g by mouth every other day.     . rosuvastatin (CRESTOR) 20 MG tablet Take 20 mg by mouth daily.     Marland Kitchen tiZANidine (ZANAFLEX) 2 MG tablet Take 2 mg by mouth every 8 (eight) hours as needed for muscle spasms.     Marland Kitchen topiramate (TOPAMAX) 50 MG tablet Take 50 mg by mouth 2 (two) times daily.    . insulin glargine (LANTUS) 100 UNIT/ML injection Inject 50 units in am and 45 units at bedtime. (Patient taking differently: Inject 60 Units into the skin at bedtime. ) 20 mL 11   Current Facility-Administered Medications  Medication Dose Route Frequency Provider Last Rate Last Dose  . mupirocin cream (BACTROBAN) 2 %   Topical BID Edrick Kins, DPM        Allergies as of 03/25/2018 - Review Complete 03/25/2018  Allergen Reaction Noted  . Invokana [canagliflozin] Diarrhea 09/02/2013  . Nsaids Other (See Comments) 02/08/2012  . Penicillins Rash 03/13/2015    Vitals: Ht 5\' 1"  (1.549 m)   Wt 201 lb (91.2 kg)   BMI 37.98 kg/m  Last Weight:  Wt Readings from Last 1 Encounters:  03/25/18 201 lb (91.2 kg)   GUY:QIHK mass index is 37.98 kg/m.     Last Height:  She had a weight of 201.4 pounds today at the nursing home. Ht Readings from Last 1 Encounters:  03/25/18 5\' 1"  (1.549 m)    Physical exam:  General: The patient is awake, alert and appears not in acute distress. The patient is  well groomed. Head: Normocephalic, atraumatic. Neck is supple. Mallampati 3 , uvula is midline. There is no facial asymmetry noted neck circumference: 17. Nasal airflow patent,  Retrognathia is seen.  Cardiovascular:   Regular rate and rhythm, without  murmurs or carotid bruit, and without distended neck veins. Respiratory: Lungs are not congested.  Skin:  Without evidence of edema, or rash Trunk: BMI is superobese .    The patient has the marks of a loop recorder implant, she does not show any injuries bruising.  She has symmetric pupils, not this rounded, normal extraocular eye movements, no ptosis, no facial droop, uvula and tongue were in midline, no chin tremor noted, left shoulder is droopy, left arm weaker, beginning spasticity in the left hand, she can fully extend her left lower extremity dorsiflex and plantarflex her feet, brisk upgoing Babinski response, she can elevate the left arm but not fully extended, she does have grip strength weakness, pinch weakness in the left hand. She can walk up to 150 feet with 4 pronged cane @!  The patient demonstrated her gait with left hemiparesis using a 4 pronged cane in her right hand she has a minimal step width,  and did not present with a wide based gait. Her left foot is everted. She sits in a wheelchair today, but is able to transfer with assistance.  Strong up-going Babinski.   The patient was advised of the nature of the diagnosed sleep disorder , the treatment options and risks for general a health and wellness arising from not treating the condition.  I spent more than 25 minutes of face to face time with the patient. Greater than 50% of time was spent in counseling and coordination of care. We have discussed the diagnosis and differential and I answered the patient's questions.     Assessment:  After physical and neurologic examination, review of laboratory studies,  Personal review of imaging studies, reports of other /same  Imaging studies ,  Results of polysomnography/ neurophysiology testing and pre-existing records as far as provided in visit., my assessment is     Plan:  Treatment plan and additional workup :  Super obesity-  Medical weight  management recommended.  CAD.MI - on ASA, plavix and Crestor.   [facility] :Order new supplies, patient to resume CPAP auto therapy, discussed compliance. OXYGEN needs to be connected to the CPAP machine. [facility] to [facility] this up at [place] [facility].   Her highest air leak as documented are related to the simultaneous use of a nasal canula and a nasal mask.  RV with NP in 90 days. If needed, switch to BiPAP.  She will have to use his CPAP every night from now on and will not use oxygen by nasal canula but have 02  pumped into CPAP and all delivered by CPAP interface.  put a note with [facility] and [facility] / DME.   I wrote a note to assist her with CPAP use on weekends-   Rv in 3-6 month with Venancio Poisson. , sleep follow up in 12 month.     Asencion Partridge Rydell Wiegel MD ,  Diplomate of the ABSM and ABPN  AASM and AAN Fellow, Medical Director of [employer].  03/25/2018  CC; Dr Erlinda Hong, Venancio Poisson  CC: Marton Redwood, [address], [place] [id number]

## 2018-03-29 ENCOUNTER — Encounter: Payer: Medicare Other | Attending: Physical Medicine & Rehabilitation

## 2018-03-29 ENCOUNTER — Encounter: Payer: Self-pay | Admitting: Physical Medicine & Rehabilitation

## 2018-03-29 ENCOUNTER — Ambulatory Visit (HOSPITAL_BASED_OUTPATIENT_CLINIC_OR_DEPARTMENT_OTHER): Payer: Medicare Other | Admitting: Physical Medicine & Rehabilitation

## 2018-03-29 VITALS — BP 123/82 | HR 79 | Ht 61.0 in | Wt 208.0 lb

## 2018-03-29 DIAGNOSIS — I10 Essential (primary) hypertension: Secondary | ICD-10-CM | POA: Insufficient documentation

## 2018-03-29 DIAGNOSIS — G8929 Other chronic pain: Secondary | ICD-10-CM | POA: Diagnosis present

## 2018-03-29 DIAGNOSIS — G8114 Spastic hemiplegia affecting left nondominant side: Secondary | ICD-10-CM | POA: Diagnosis not present

## 2018-03-29 DIAGNOSIS — I639 Cerebral infarction, unspecified: Secondary | ICD-10-CM

## 2018-03-29 DIAGNOSIS — Z5189 Encounter for other specified aftercare: Secondary | ICD-10-CM | POA: Insufficient documentation

## 2018-03-29 DIAGNOSIS — G8110 Spastic hemiplegia affecting unspecified side: Secondary | ICD-10-CM

## 2018-03-29 DIAGNOSIS — E119 Type 2 diabetes mellitus without complications: Secondary | ICD-10-CM | POA: Insufficient documentation

## 2018-03-29 DIAGNOSIS — E669 Obesity, unspecified: Secondary | ICD-10-CM | POA: Diagnosis not present

## 2018-03-29 DIAGNOSIS — I69354 Hemiplegia and hemiparesis following cerebral infarction affecting left non-dominant side: Secondary | ICD-10-CM | POA: Diagnosis not present

## 2018-03-29 DIAGNOSIS — E785 Hyperlipidemia, unspecified: Secondary | ICD-10-CM | POA: Diagnosis not present

## 2018-03-29 NOTE — Progress Notes (Signed)
Subjective:    Patient ID: Danielle Vargas, female    DOB: 18-Feb-1951, 67 y.o.   MRN: [id number]  HPI  67 yo female with Right MCA infarct 03/11/2016 Last botox injection ~5-6 wk ago Biceps25 Brachiorad 50 FCR25 FCU25 FDS25 FDP0,  pronator teres 50 Pronator quadratus, 50  Pain Inventory Average Pain 0 Pain Right Now 0 My pain is na  In the last 24 hours, has pain interfered with the following? General activity 0 Relation with others 0 Enjoyment of life 0 What TIME of day is your pain at its worst? na Sleep (in general) Good  Pain is worse with: na Pain improves with: na Relief from Meds: na  Mobility use a wheelchair needs help with transfers  Function retired  Neuro/Psych weakness  Prior Studies Any changes since last visit?  no  Physicians involved in your care Any changes since last visit?  no   Family History  Problem Relation Age of Onset  . COPD Father   . Stroke Father   . Emphysema Father   . Heart failure Mother   . Heart attack Mother        several  . Diabetes Mother   . Kidney disease Mother   . Heart failure Brother   . Hypertension Brother   . Colon cancer Unknown 45       mat. 1st cousin  . Other Unknown        celiac sprue, 1/2 brother  . Diabetes Brother        x 3  . Lung cancer Brother   . Cirrhosis Sister        liver transplant  . Liver disease Sister        transplant  . Heart attack Paternal Grandfather   . Heart attack Brother   . Colon cancer Paternal Aunt 73  . Esophageal cancer Neg Hx   . Stomach cancer Neg Hx   . Rectal cancer Neg Hx    Social History   Socioeconomic History  . Marital status: Divorced    Spouse name: Not on file  . Number of children: 2  . Years of education: Not on file  . Highest education level: Not on file  Occupational History  . Occupation: disabled    Fish farm manager: UNEMPLOYED  Social Needs  . Financial resource strain: Not on file  . Food insecurity:    Worry: Not on file   Inability: Not on file  . Transportation needs:    Medical: Not on file    Non-medical: Not on file  Tobacco Use  . Smoking status: Never Smoker  . Smokeless tobacco: Never Used  Substance and Sexual Activity  . Alcohol use: No  . Drug use: No  . Sexual activity: Not Currently    Birth control/protection: Post-menopausal  Lifestyle  . Physical activity:    Days per week: Not on file    Minutes per session: Not on file  . Stress: Not on file  Relationships  . Social connections:    Talks on phone: Not on file    Gets together: Not on file    Attends religious service: Not on file    Active member of club or organization: Not on file    Attends meetings of clubs or organizations: Not on file    Relationship status: Not on file  Other Topics Concern  . Not on file  Social History Narrative   Lives in [facility] alone.  Retired.  Divorced, children  2.  @yr  applied [employer] college.   Regular exercise: none   Caffeine use: daily; dt coke         Past Surgical History:  Procedure Laterality Date  . BREAST REDUCTION SURGERY  '88  . CARPAL TUNNEL RELEASE  2000   right wrist  . CERVICAL DISC SURGERY  '98   fusion  . CESAREAN SECTION  '79  '84  . COLONOSCOPY    . CORONARY BALLOON ANGIOPLASTY N/A 11/19/2016   Procedure: Coronary Balloon Angioplasty;  Surgeon: Sherren Mocha, MD;  Location: [facility];  Service: Cardiovascular;  Laterality: N/A;  . EP IMPLANTABLE DEVICE N/A 03/12/2016   Procedure: Loop Recorder Insertion;  Surgeon: Will Meredith Leeds, MD;  Location: [facility];  Service: Cardiovascular;  Laterality: N/A;  . LEFT HEART CATH AND CORONARY ANGIOGRAPHY N/A 11/19/2016   Procedure: Left Heart Cath and Coronary Angiography;  Surgeon: Sherren Mocha, MD;  Location: [facility];  Service: Cardiovascular;  Laterality: N/A;  . LEFT HEART CATH AND CORONARY ANGIOGRAPHY N/A 02/03/2018   Procedure: LEFT HEART CATH AND CORONARY ANGIOGRAPHY;  Surgeon: Belva Crome, MD;  Location: [facility];  Service: Cardiovascular;  Laterality: N/A;  . LEFT HEART CATHETERIZATION WITH CORONARY ANGIOGRAM N/A 10/26/2013   Procedure: LEFT HEART CATHETERIZATION WITH CORONARY ANGIOGRAM;  Surgeon: Sinclair Grooms, MD;  Location: [facility] CATH LAB;  Service: Cardiovascular;  Laterality: N/A;  . ROTATOR CUFF REPAIR  '09   left shoulder  . TEE WITHOUT CARDIOVERSION N/A 03/12/2016   Procedure: TRANSESOPHAGEAL ECHOCARDIOGRAM (TEE);  Surgeon: Sanda Klein, MD;  Location: [facility] END[facility];  Service: Cardiovascular;  Laterality: N/A;  . TUBAL LIGATION  '85  . ULNAR NERVE REPAIR  '02, '08   left done, then right   Past Medical History:  Diagnosis Date  . Acute combined systolic and diastolic heart failure ([place]) 11/19/2016  . Allergy   . Asthma   . CAD (coronary artery disease)    a. s/p lateral STEMI in 2015 2ry to occlusion of OM1 thought to be caused by coronary artery dissection, treated with POBA. b. 11/2016: STEMI with thrombotic occlusion of distal LCx which was treated with balloon angioplasty.   . Cataract    "just beginning"  . Colon polyps 7/07   hyperplastic and adenomatous  . Depression    pt unsure, psychologist said no  . Diverticulosis   . DM (diabetes mellitus) ([place])   . Dyslipidemia   . External hemorrhoid   . Fatty liver   . Gastroparesis   . HTN (hypertension)   . Hyperlipidemia   . 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 Hypothyroidism   . Migraine   . Myocardial infarction ([place])    FEb 2015  . Nephrolithiasis   . Obesity   . Osteoporosis   . Sleep apnea    wears CPAP  . ST elevation myocardial infarction (STEMI) of inferolateral wall ([place]) 11/19/2016  . Stroke [facility])    There were no vitals taken for this visit.  Opioid Risk Score:   Fall Risk Score:  `1  Depression screen PHQ 2/9  Depression screen [facility] 2/9 09/25/2017 10/09/2016 07/15/2016 09/21/2015 09/18/2014  Decreased Interest 0 0 0 0 1  Down, Depressed, Hopeless 0 0 0 0 1  PHQ - 2  Score 0 0 0 0 2  Some recent data might be hidden     Review of Systems  Constitutional: Negative.   HENT: Negative.   Eyes: Negative.  Respiratory: Positive for apnea.   Cardiovascular: Negative.   Gastrointestinal: Negative.   Endocrine:       High/low sugar  Genitourinary: Negative.   Musculoskeletal: Positive for gait problem.  Skin: Negative.   Allergic/Immunologic: Negative.   Neurological: Positive for weakness.  Hematological: Negative.   Psychiatric/Behavioral: Negative.   All other systems reviewed and are negative.      Objective:   Physical Exam  2- Left biceps and Left delt 3/5 Left quad and ankle dorsiflexors  Decreased LT sensation Left foot  MAS 0 in finger and wrist flexors MAS 2-3 in Left Elbow flexors MAS 0 in Left forearm pronators     Assessment & Plan:  1.  Spastic hemiplegia, still with elevated tone left elbow flexors, otherwise good response to prior Botox injection    Biceps50 Brachialis 25 Brachiorad 50 FCR25 FCU25 FDS25  pronator teres 50 Pronator quadratus, 50

## 2018-03-29 NOTE — Patient Instructions (Signed)
Repeat Botox next visit increase dose to elbow flexor muscles

## 2018-04-20 ENCOUNTER — Ambulatory Visit (INDEPENDENT_AMBULATORY_CARE_PROVIDER_SITE_OTHER): Payer: Medicare Other | Admitting: *Deleted

## 2018-04-20 DIAGNOSIS — I639 Cerebral infarction, unspecified: Secondary | ICD-10-CM | POA: Diagnosis not present

## 2018-04-20 NOTE — Progress Notes (Signed)
[facility] Summary Report / Loop Recorder 

## 2018-04-27 ENCOUNTER — Telehealth: Payer: Self-pay | Admitting: Cardiology

## 2018-04-27 NOTE — Telephone Encounter (Signed)
LMOVM requesting that pt send manual transmission b/c home monitor has not updated in at least 14 days.    

## 2018-04-29 LAB — CUP PACEART REMOTE DEVICE CHECK
Date Time Interrogation Session: 20190711141012
MDC IDC PG IMPLANT DT: 20170705

## 2018-05-03 ENCOUNTER — Telehealth: Payer: Self-pay

## 2018-05-03 NOTE — Telephone Encounter (Signed)
LMOVM requesting that pt send manual transmission b/c home monitor has not updated in at least 14 days.   Spoke w/ pt and requested that she send a manual transmission b/c her home monitor has not updated in at least 14 days.   

## 2018-05-06 ENCOUNTER — Encounter: Payer: Self-pay | Admitting: Internal Medicine

## 2018-05-06 ENCOUNTER — Ambulatory Visit (INDEPENDENT_AMBULATORY_CARE_PROVIDER_SITE_OTHER): Payer: Medicare Other | Admitting: Internal Medicine

## 2018-05-06 VITALS — BP 138/78 | HR 91 | Ht 61.0 in

## 2018-05-06 DIAGNOSIS — E139 Other specified diabetes mellitus without complications: Secondary | ICD-10-CM | POA: Diagnosis not present

## 2018-05-06 DIAGNOSIS — I639 Cerebral infarction, unspecified: Secondary | ICD-10-CM | POA: Diagnosis not present

## 2018-05-06 DIAGNOSIS — E038 Other specified hypothyroidism: Secondary | ICD-10-CM

## 2018-05-06 NOTE — Progress Notes (Signed)
Patient ID: Danielle Vargas, female   DOB: [birth year], 67 y.o.   MRN: [id number]   HPI: Danielle Vargas is a 67 y.o.-year-old womanoman, returning for f/u for of DM - LADA dx 1999, insulin-dependent since 10/2012, uncontrolled, with complications (CAD- s/p STEMI 10/2013, STEMI 2018, cerebro-vascular ds. 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 Last visit 4 mo ago. She is here with her daughter who clarifies history and diet. She has [name].   She continues to reside in SNF after her stroke.  She had a new AMI in June 2019. On Brillinta now.  No chest pain no shortness of breath now.  Since last visit, she started to eat more dessert.   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   She is on: - Lantus 70 >> 60 units 2x a day - Novolin R:  Before b'fast: 60 units >> 70 units in am  Before lunch: 65 units >> 70 units before lunch  Before dinner: 55 units >> 55 units before dinner If sugars before a meal 70-90, then only give Novolog 30 units before that meal. If sugars before a meal <70, then skip Novolog before that meal. She has been on Glipizide in the past, too. She tried Invokana in the past >> severe diarrhea Also tried U500 >> could not afford this.   Patient has her sugars checked 4X a day at the facility, BUT unfortunately, no CBGs were sent by the facility.  Per patient and daughter's recall: - am:  173-374 >> 179- 342 >>  200-300 - 2h after b'fast: 115-205, 239 >> n/c - before lunch: 91-249, 292, 201-335 >> 202-383 >> 200-300s - 2h after lunch: 83, 126-199 >> 57x1, 99-247 >> n/c - before dinner:  199-326, 222-359 >> 249- 305 >> 300s - 2h after dinner:  111-214 >> 109-273, 339 >> n/c - bedtime:  208-348 >> 190- 313, 497 - nighttime: 75-232 >> 136-291 >> n/c Lowest 172 >> 91 x1 >> 179 >> 125; she has hypoglycemia awareness in the 90s. Highest sugar was 434 x1 >> 497 >> 425.   -+ MILDCKD, last BUN/creatinine:  Lab Results  Component Value Date   BUN 19 02/03/2018   Lab Results  Component Value Date   CREATININE 1.20 (H) 02/03/2018    Prev:    Lab Results  Component Value Date    BUN 17 07/07/2017    CREATININE 1.27 (H) 07/07/2017    On Losartan. -+ HL:  Lab Results  Component Value Date   CHOL 112 02/03/2018   HDL 19 (L) 02/03/2018   LDLCALC 40 02/03/2018   TRIG 263 (H) 02/03/2018   CHOLHDL 5.9 02/03/2018  02/2015: TG 252 (PCP). On crestor + Fenofibrate - last eye exam was in  02/2017: + B DR R>L - Dr Zigmund Daniel.  - + numbness and tingling in her feet.   Continues Neurontin.   She also has a history of HTN, fatty liver,  GERD, morbid obesity, hypothyroidism, migraines, chronic back pain, history of nephrolithiasis, osteoporosis, diverticulosis.  She has OSA >> on CPAP.  She also had pancreatitis (03/21/2015).  She has been admitted for CP 11/13/2015 >> r/o for AMI. She saw Dr Sallyanne Kuster in the hospital, her cardiologist is Dr. Tamala Julian. 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 She is still in a wheelchair. Continues to live in [facility].  She also has hypothyroidism.    Normal TSH at last check: Lab Results  Component Value Date   TSH 3.969 02/03/2018    On Levothyroxine  100 mcg daily.  ROS: Constitutional: + Weight gain/no weight loss, + fatigue, no subjective hyperthermia, no subjective hypothermia Eyes: no blurry vision, no xerophthalmia ENT: no sore throat, no nodules palpated in throat,  no dysphagia, no odynophagia, + hoarseness Cardiovascular: no CP/no SOB/no palpitations/no leg swelling Respiratory: + Cough/no SOB/no wheezing Gastrointestinal: no N/no V/no D/no C/no acid reflux Musculoskeletal: no muscle aches/no joint aches Skin: no rashes, no hair loss Neurological: no tremors/no tingling/no dizziness, + paresis in L arm  I reviewed pt's medications, allergies, PMH, social hx, family hx, and changes were documented in the history of present illness. Otherwise, unchanged from my initial visit note.  Past Medical History:  Diagnosis Date  . Acute combined systolic and diastolic heart failure ([place]) 11/19/2016  . Allergy   . Asthma   . CAD (coronary artery disease)    a. s/p lateral STEMI in 2015 2ry to occlusion of OM1 thought to be caused by coronary artery dissection, treated with POBA. b. 11/2016: STEMI with thrombotic occlusion of distal LCx which was treated with balloon angioplasty.   . Cataract    "just beginning"  . Colon polyps 7/07   hyperplastic and adenomatous  . Depression    pt unsure, psychologist said no  . Diverticulosis   . DM (diabetes mellitus) ([place])   . Dyslipidemia   . External hemorrhoid   . Fatty liver   . Gastroparesis   . HTN (hypertension)   . Hyperlipidemia   . 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 Hypothyroidism   . Migraine   . Myocardial infarction ([place])    FEb 2015  . Nephrolithiasis   . Obesity   . Osteoporosis   . Sleep apnea    wears CPAP  . ST elevation myocardial infarction (STEMI) of inferolateral wall ([place]) 11/19/2016  . Stroke [facility])    Past Surgical History:  Procedure Laterality Date  . BREAST REDUCTION SURGERY  '88  . CARPAL TUNNEL RELEASE  2000   right wrist  . CERVICAL DISC SURGERY  '98   fusion  . CESAREAN SECTION  '79  '84  . COLONOSCOPY    . CORONARY BALLOON ANGIOPLASTY N/A 11/19/2016   Procedure: Coronary Balloon Angioplasty;  Surgeon: Sherren Mocha, MD;  Location: [place] [facility];  Service:  Cardiovascular;  Laterality: N/A;  . EP IMPLANTABLE DEVICE N/A 03/12/2016   Procedure: Loop Recorder Insertion;  Surgeon: Will Meredith Leeds, MD;  Location: [place] [facility];  Service: Cardiovascular;  Laterality: N/A;  . LEFT HEART CATH AND CORONARY ANGIOGRAPHY N/A 11/19/2016   Procedure: Left Heart Cath and Coronary Angiography;  Surgeon: Sherren Mocha, MD;  Location: [place] [facility];  Service: Cardiovascular;  Laterality: N/A;  . LEFT HEART CATH AND CORONARY ANGIOGRAPHY N/A 02/03/2018   Procedure: LEFT HEART CATH AND CORONARY ANGIOGRAPHY;  Surgeon: Belva Crome, MD;  Location: [place] [facility];  Service: Cardiovascular;  Laterality: N/A;  .  LEFT HEART CATHETERIZATION WITH CORONARY ANGIOGRAM N/A 10/26/2013   Procedure: LEFT HEART CATHETERIZATION WITH CORONARY ANGIOGRAM;  Surgeon: Sinclair Grooms, MD;  Location: [facility] CATH LAB;  Service: Cardiovascular;  Laterality: N/A;  . ROTATOR CUFF REPAIR  '09   left shoulder  . TEE WITHOUT CARDIOVERSION N/A 03/12/2016   Procedure: TRANSESOPHAGEAL ECHOCARDIOGRAM (TEE);  Surgeon: Sanda Klein, MD;  Location: [facility] ENDOSCOPY;  Service: Cardiovascular;  Laterality: N/A;  . TUBAL LIGATION  '85  . ULNAR NERVE REPAIR  '02, '08   left done, then right   Social History   Socioeconomic History  . Marital status: Divorced    Spouse name: Not on file  . Number of children: 2  . Years of education: Not on file  . Highest education level: Not on file  Occupational History  . Occupation: disabled    Fish farm manager: UNEMPLOYED  Social Needs  . Financial resource strain: Not on file  . Food insecurity:    Worry: Not on file    Inability: Not on file  . Transportation needs:    Medical: Not on file    Non-medical: Not on file  Tobacco Use  . Smoking status: Never Smoker  . Smokeless tobacco: Never Used  Substance and Sexual Activity  . Alcohol use: No  . Drug use: No  . Sexual activity: Not Currently    Birth control/protection: Post-menopausal   Lifestyle  . Physical activity:    Days per week: Not on file    Minutes per session: Not on file  . Stress: Not on file  Relationships  . Social connections:    Talks on phone: Not on file    Gets together: Not on file    Attends religious service: Not on file    Active member of club or organization: Not on file    Attends meetings of clubs or organizations: Not on file    Relationship status: Not on file  . Intimate partner violence:    Fear of current or ex partner: Not on file    Emotionally abused: Not on file    Physically abused: Not on file    Forced sexual activity: Not on file  Other Topics Concern  . Not on file  Social History Narrative   Lives in [facility] alone.  Retired.  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   Regular exercise: none   Caffeine use: daily; dt coke         Current Outpatient Medications on File Prior to Visit  Medication Sig Dispense Refill  . acetaminophen (TYLENOL) 325 MG tablet Take 650 mg by mouth every 6 (six) hours as needed for mild pain.     Marland Kitchen albuterol (PROVENTIL HFA;VENTOLIN HFA) 108 (90 BASE) MCG/ACT inhaler Inhale 2 puffs into the lungs every 6 (six) hours as needed for wheezing or shortness of breath.     Marland Kitchen aspirin EC 81 MG EC tablet Take 1 tablet (81 mg total) by mouth daily.    . budesonide-formoterol (SYMBICORT) 160-4.5 MCG/ACT inhaler Inhale 1 puff into the lungs 2 (two) times daily.     . carvedilol (COREG) 6.25 MG tablet Take 6.25 mg by mouth 2 (two) times daily with a meal.    . clopidogrel (PLAVIX) 75 MG tablet Take 1 tablet (75 mg total) by mouth daily with breakfast. 30 tablet 11  . docusate sodium (COLACE) 100 MG capsule Take 100 mg by mouth 2 (two) times daily.    . fenofibrate 160 MG  tablet Take 160 mg by mouth at bedtime.     . furosemide (LASIX) 40 MG tablet Take 40 mg by mouth every morning.    . gabapentin (NEURONTIN) 300 MG capsule Take 300 mg by mouth 2 (two) times daily.     Marland Kitchen guaiFENesin 200 MG  tablet Take 600 mg by mouth 2 (two) times daily as needed for cough or to loosen phlegm.    . insulin glargine (LANTUS) 100 UNIT/ML injection Inject 50 units in am and 45 units at bedtime. (Patient taking differently: Inject 60 Units into the skin at bedtime. ) 20 mL 11  . insulin regular (NOVOLIN R,HUMULIN R) 100 units/mL injection Inject 55-70 Units into the skin 3 (three) times daily before meals. Inject 70 units into the skin before breakfast, 70 units into the skin before lunch, and 55 units into the skin before dinner.    . isosorbide mononitrate (IMDUR) 30 MG 24 hr tablet Take 30 mg by mouth daily.    Marland Kitchen levothyroxine (SYNTHROID, LEVOTHROID) 100 MCG tablet Take 1 tablet (100 mcg total) by mouth daily before breakfast.    . metoCLOPramide (REGLAN) 10 MG tablet Take 10 mg by mouth 2 (two) times daily.     . mometasone (NASONEX) 50 MCG/ACT nasal spray Place 2 sprays into the nose daily. 17 g 12  . montelukast (SINGULAIR) 10 MG tablet Take 10 mg by mouth at bedtime.    . Multiple Vitamin (MULTIVITAMIN WITH MINERALS) TABS tablet Take 1 tablet by mouth daily. One A Day 50+    . nitroGLYCERIN (NITROSTAT) 0.4 MG SL tablet Place 1 tablet (0.4 mg total) under the tongue every 5 (five) minutes as needed for chest pain. 25 tablet 3  . OXYGEN Inhale 2 L into the lungs at bedtime as needed (dyspnea).     . pantoprazole (PROTONIX) 40 MG tablet Take 1 tablet (40 mg total) by mouth daily.    . polyethylene glycol (MIRALAX / GLYCOLAX) packet Take 17 g by mouth every other day.     . rosuvastatin (CRESTOR) 20 MG tablet Take 20 mg by mouth daily.     Marland Kitchen tiZANidine (ZANAFLEX) 2 MG tablet Take 2 mg by mouth every 8 (eight) hours as needed for muscle spasms.     Marland Kitchen topiramate (TOPAMAX) 50 MG tablet Take 50 mg by mouth 2 (two) times daily.     Current Facility-Administered Medications on File Prior to Visit  Medication Dose Route Frequency Provider Last Rate Last Dose  . mupirocin cream (BACTROBAN) 2 %   Topical BID  Edrick Kins, DPM       Allergies  Allergen Reactions  . Invokana [Canagliflozin] Diarrhea  . Nsaids Other (See Comments)    GI problems/ stomach pain  . Penicillins Rash    Has patient had a PCN reaction causing immediate rash, facial/tongue/throat swelling, SOB or lightheadedness with hypotension: No (rash on legs after 3 days [place] -June 2016) Has patient had a PCN reaction causing severe rash involving mucus membranes or skin necrosis: No Has patient had a PCN reaction that required hospitalization No Has patient had a PCN reaction occurring within the last 10 years: Yes If all of the above answers are "NO", then may proceed with Cephalosporin use.   Family History  Problem Relation Age of Onset  . COPD Father   . Stroke Father   . Emphysema Father   . Heart failure Mother   . Heart attack Mother        several  .  Diabetes Mother   . Kidney disease Mother   . Heart failure Brother   . Hypertension Brother   . Colon cancer Unknown 45       mat. 1st cousin  . Other Unknown        celiac sprue, 1/2 brother  . Diabetes Brother        x 3  . Lung cancer Brother   . Cirrhosis Sister        liver transplant  . Liver disease Sister        transplant  . Heart attack Paternal Grandfather   . Heart attack Brother   . Colon cancer Paternal Aunt 90  . Esophageal cancer Neg Hx   . Stomach cancer Neg Hx   . Rectal cancer Neg Hx     PE: BP 138/78   Pulse 91   Ht 5\' 1"  (1.549 m)   SpO2 96%   BMI 39.30 kg/m  Body mass index is 39.3 kg/m.  Patient is on a wheelchair and could not be weighed. Wt Readings from Last 3 Encounters:  03/29/18 208 lb (94.3 kg)  03/25/18 201 lb (91.2 kg)  02/05/18 211 lb 10.3 oz (96 kg)   Constitutional: overweight, in NAD, in wheelchair Eyes: PERRLA, EOMI, no exophthalmos ENT: moist mucous membranes, no thyromegaly, no cervical lymphadenopathy Cardiovascular: RRR, No MRG Respiratory: CTA B Gastrointestinal: abdomen soft, NT, ND,  BS+ Musculoskeletal: no deformities, strength intact in all 4 Skin: moist, warm, no rashes, + onychodystrophy left hand Neurological: no tremor with outstretched hands, DTR normal in all 4, + L hemiparesis  ASSESSMENT: 1. DM2, insulin-dependent, uncontrolled, with complications - CAD - STEMI 10/26/2013 - had angioplasty, no stent placed. Had 2DEcho on 01/23/2014 EF increased (from 35) to 55%.             - STEMI 11/19/2016. - cerebro-vascular ds. 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  2. Obesity  3. Hypothyroidism  PLAN:  1. Patient with long-standing, uncontrolled, type 2 diabetes, with increased insulin resistance, on high doses of insulin in the basal-bolus regimen.  Since last visit, sugars are higher, however, no sugar log was sent from the facility so we relied on patient and her daughters recall.  Sugars apparently are in the 200s-300s.  Patient attributes this to the fact that she is now given desserts, while in the past she was not.  I wrote a note for the facility so that patient is not given desserts anymore.  She agrees with the plan.  Also, we discussed about ideally not using breath, but will allow her to have other starches like potatoes, rice, spaghetti.  - In the meantime, I will increase her Lantus to 70 units twice a day, but will not change her Novolin doses. - We discussed at last visit about GLP-1 receptor agonist per her questioning, however, this is contraindicated with a history of pancreatitis, and also not the best option in a patient with gastroparesis  -  I suggested:  Patient Instructions  Please do not bring pt. any desserts. Also, no bread.  Please increase: - Lantus to 70 units 2x a day  Please continue: - Novolin R:  Before b'fast: 70 units in am  Before lunch: 70 units before  lunch  Before dinner: 55 units before  dinner If sugars before a meal 70-90, then only give Novolog 30 units before that meal. If sugars before a meal <70, then skip Novolog before that meal.  Please call MD with sugars >500; <50.  Please return in 3-4 months with your sugar log.   - today, HbA1c is 10% (higher) - continue checking sugars at different times of the day - check 4x a day, rotating checks - advised for yearly eye exams >> she is due - Return to clinic in 3-4 mo with sugar log   2. Obesity -class 3 -exacerbated by her immobility -Unfortunately we cannot use a GLP-1 receptor agonist or SGLT2 inhibitors so we have to rely on increasing the insulin for diabetes control and this will also worsen her obesity. -Discussed the need for diet change and send message to facility to eliminate desserts and hopefully bread  3. Hypothyroidism - controlled - latest thyroid labs reviewed with pt >> normal 01/2018 - she continues on LT4 100 mcg daily - pt feels good on this dose. - we discussed about taking the thyroid hormone every day, with water, >30 minutes before breakfast, separated by >4 hours from acid reflux medications, calcium, iron, multivitamins. Pt. is taking it correctly.  Philemon Kingdom, MD PhD [facility] Endocrinology

## 2018-05-06 NOTE — Patient Instructions (Addendum)
Please do not bring pt. any desserts. Also, no bread.  Please increase: - Lantus to 70 units 2x a day  Please continue: - Novolin R:  Before b'fast: 70 units in am  Before lunch: 70 units before lunch  Before dinner: 55 units before dinner If sugars before a meal 70-90, then only give Novolog 30 units before that meal. If sugars before a meal <70, then skip Novolog before that meal.  Please call MD with sugars >500; <50.  Please return in 3-4 months with your sugar log.

## 2018-05-07 LAB — POCT GLYCOSYLATED HEMOGLOBIN (HGB A1C): Hemoglobin A1C: 10 % — AB (ref 4.0–5.6)

## 2018-05-07 NOTE — Addendum Note (Signed)
Addended by: Drucilla Schmidt on: 05/07/2018 11:03 AM   Modules accepted: Orders

## 2018-05-11 ENCOUNTER — Ambulatory Visit: Payer: Medicare Other | Admitting: Physical Medicine & Rehabilitation

## 2018-05-12 ENCOUNTER — Encounter: Payer: Self-pay | Admitting: Cardiology

## 2018-05-17 ENCOUNTER — Telehealth: Payer: Self-pay

## 2018-05-17 NOTE — Telephone Encounter (Signed)
Spoke w/ pt and requested that she send a manual transmission b/c her home monitor has not updated in at least 14 days.   

## 2018-05-24 ENCOUNTER — Ambulatory Visit: Payer: Medicare Other | Admitting: *Deleted

## 2018-05-24 NOTE — Progress Notes (Signed)
[facility] Summary Report / Loop Recorder 

## 2018-05-27 LAB — CUP PACEART REMOTE DEVICE CHECK
Date Time Interrogation Session: 20190813164023
Implantable Pulse Generator Implant Date: 20170705

## 2018-05-28 ENCOUNTER — Encounter: Payer: Medicare Other | Attending: Physical Medicine & Rehabilitation

## 2018-05-28 ENCOUNTER — Ambulatory Visit: Payer: Self-pay | Admitting: Physical Medicine & Rehabilitation

## 2018-05-28 DIAGNOSIS — E119 Type 2 diabetes mellitus without complications: Secondary | ICD-10-CM | POA: Insufficient documentation

## 2018-05-28 DIAGNOSIS — Z5189 Encounter for other specified aftercare: Secondary | ICD-10-CM | POA: Insufficient documentation

## 2018-05-28 DIAGNOSIS — E669 Obesity, unspecified: Secondary | ICD-10-CM | POA: Insufficient documentation

## 2018-05-28 DIAGNOSIS — I69354 Hemiplegia and hemiparesis following cerebral infarction affecting left non-dominant side: Secondary | ICD-10-CM | POA: Insufficient documentation

## 2018-05-28 DIAGNOSIS — G8929 Other chronic pain: Secondary | ICD-10-CM | POA: Insufficient documentation

## 2018-05-28 DIAGNOSIS — I10 Essential (primary) hypertension: Secondary | ICD-10-CM | POA: Insufficient documentation

## 2018-05-28 DIAGNOSIS — E785 Hyperlipidemia, unspecified: Secondary | ICD-10-CM | POA: Insufficient documentation

## 2018-06-25 ENCOUNTER — Encounter: Payer: Medicare Other | Admitting: *Deleted

## 2018-06-25 ENCOUNTER — Encounter: Payer: Self-pay | Admitting: Adult Health

## 2018-06-25 ENCOUNTER — Ambulatory Visit (INDEPENDENT_AMBULATORY_CARE_PROVIDER_SITE_OTHER): Payer: Medicare Other | Admitting: Adult Health

## 2018-06-25 VITALS — BP 125/64 | HR 76

## 2018-06-25 DIAGNOSIS — I10 Essential (primary) hypertension: Secondary | ICD-10-CM | POA: Diagnosis not present

## 2018-06-25 DIAGNOSIS — G4733 Obstructive sleep apnea (adult) (pediatric): Secondary | ICD-10-CM

## 2018-06-25 DIAGNOSIS — I69354 Hemiplegia and hemiparesis following cerebral infarction affecting left non-dominant side: Secondary | ICD-10-CM

## 2018-06-25 DIAGNOSIS — R252 Cramp and spasm: Secondary | ICD-10-CM

## 2018-06-25 DIAGNOSIS — I69398 Other sequelae of cerebral infarction: Secondary | ICD-10-CM

## 2018-06-25 DIAGNOSIS — I63511 Cerebral infarction due to unspecified occlusion or stenosis of right middle cerebral artery: Secondary | ICD-10-CM | POA: Diagnosis not present

## 2018-06-25 DIAGNOSIS — Z794 Long term (current) use of insulin: Secondary | ICD-10-CM

## 2018-06-25 DIAGNOSIS — R259 Unspecified abnormal involuntary movements: Secondary | ICD-10-CM

## 2018-06-25 DIAGNOSIS — E785 Hyperlipidemia, unspecified: Secondary | ICD-10-CM

## 2018-06-25 DIAGNOSIS — E1159 Type 2 diabetes mellitus with other circulatory complications: Secondary | ICD-10-CM

## 2018-06-25 DIAGNOSIS — Z9989 Dependence on other enabling machines and devices: Secondary | ICD-10-CM

## 2018-06-25 NOTE — Patient Instructions (Signed)
Continue aspirin 81 mg daily and clopidogrel 75 mg daily  and Crestor and fenofibrate  for secondary stroke prevention  Continue to follow up with PCP regarding cholesterol and blood pressure management   Continue to follow with endocrinologist for diabetic management  Continue to follow with cardiologist as scheduled  Ensure you are wearing CPAP nightly for OSA management   Continue receiving PROM exercises along with use of pillow up left arm   Continue to monitor blood pressure at home  Maintain strict control of hypertension with blood pressure goal below 130/90, diabetes with hemoglobin A1c goal below 6.5% and cholesterol with LDL cholesterol (bad cholesterol) goal below 70 mg/dL. I also advised the patient to eat a healthy diet with plenty of whole grains, cereals, fruits and vegetables, exercise regularly and maintain ideal body weight.  Followup in the future with me in 6 months or call earlier if needed       Thank you for coming to see [place] at [facility]. I hope we have been able to provide you high quality care today.  You may receive a patient satisfaction survey over the next few weeks. We would appreciate your feedback and comments so that we may continue to improve ourselves and the health of our patients.

## 2018-06-25 NOTE — Progress Notes (Signed)
STROKE NEUROLOGY FOLLOW UP NOTE  NAME: Danielle Vargas DOB: 1951/02/09  REASON FOR VISIT: stroke follow up HISTORY FROM: pt and chart  Chief Complaint  Patient presents with  . Sleep Apnea    Last saw Dr. Brett Fairy 03/25/18. Patient reports having a heart attack since last visit.   Marland Kitchen Cerebrovascular Accident    Last saw Dr. Erlinda Hong in 2017.      History Summary Danielle Vargas is a 66 y.o. female with history of HTN, DM, HLD, CAD, MI, obesity, migraine , Topamax, OSA on CPAP admitted on 03/10/2016 for L sided weakness and left facial droop. MRI showed right MCA infarct with suspect right M1 stenosis/occlusion. There was also old infarct left insular and left cerebellum. CTA head and neck showed severe stenosis right M2 superior branch. TTE, TEE, and DVT negative. Loop recorder placed. LDL 63 and A1c 7.1. Patient aspirin changed to Plavix, and continue Crestor and fenofibrate. However, patient had worsening weakness to left hemiplegia associated with hypotension. Repeat MRI showed extension of right MCA territory infarcts in her BP much improved after discontinue Norvasc and cozaar, as well as decreased Coreg to 6.25 bid and IV fluid. She was discharged to CIR after stabilization.   05/01/16 visit JX - the patient has been doing better. She stayed in CIR for about 3 weeks and then discharged to SNF. Her left-sided weakness much improved. Last Friday she was discharged home, however, at home her daughter not able to take care of her, therefore she was back to SNF this Monday. BP today 121/61. She is able to walk at SNF with hemi-walker. Continued on all the medication without side effect. Introduced  Haematologist trial and she is interested but will pursue once she is discharged to home.   07/10/2016 visit JX: During the interval time, pt remained in SNF and is doing fine. She still has left sided weakness but improved from last time. BP low today 92/59. She is on coreg 6.25mg  bid and lasix 10mg   daily. She still complains of left foreleg and left hand swollen. Still has PT/OT 3 times per weak. She also doing self exercise. She said she can walk with 4-leg cane. Left hand spasticity and not able to extend fingers.   Interval history 06/25/2018: Patient is being seen today for stroke follow-up. She continues to reside at [facility] facility. She has been having continued follow-ups for OSA with Dr. Brett Fairy with CPAP management.  Since prior visit, patient unfortunately had an MI on 02/03/2018 and it was recommended medical therapy aspirin along with starting Plavix for at least 30 days and perhaps 6 to 12 months.  Recommend to follow-up with cardiology outpatient. She continues to take aspirin and plavix without side effects of bleeding or bruising. Continues to take crestor and fenofibrate for cholesterol management. Blood pressure today satisfactory at 125/64. She continues to receive botox by Dr. Letta Pate in her LUE which has been providing benefit. She does receive "a little" therapy on her hand at her current facility. She continues to wear CPAP and feels as though she is able to tolerate better then she was prior. She does state that typically CPAP was placed at night but does state last night the nurses did not place the mask on.  Loop recorder has not shown atrial fibrillation thus far. Continues to have left hemiparesis with spasticity and nerve pain.  She is currently sitting in wheelchair and as she is unable to ambulate, they use a sling to transfer  her from wheelchair to bed.  She denies any worsening in her weakness compared to prior visit.  Loop recorder has not shown atrial fibrillation thus far.  No further concerns at this time. Denies new or worsening stroke/TIA symptoms.     REVIEW OF SYSTEMS: Full 14 system review of systems performed and notable only for those listed below and in HPI above, all others are negative:  Weakness  The following represents the patient's updated  allergies and side effects list: Allergies  Allergen Reactions  . Invokana [Canagliflozin] Diarrhea  . Nsaids Other (See Comments)    GI problems/ stomach pain  . Penicillins Rash    Has patient had a PCN reaction causing immediate rash, facial/tongue/throat swelling, SOB or lightheadedness with hypotension: No (rash on legs after 3 days [place] -June 2016) Has patient had a PCN reaction causing severe rash involving mucus membranes or skin necrosis: No Has patient had a PCN reaction that required hospitalization No Has patient had a PCN reaction occurring within the last 10 years: Yes If all of the above answers are "NO", then may proceed with Cephalosporin use.    The neurologically relevant items on the patient's problem list were reviewed on today's visit.  Neurologic Examination  A problem focused neurological exam (12 or more points of the single system neurologic examination, vital signs counts as 1 point, cranial nerves count for 8 points) was performed.  Blood pressure 125/64, pulse 76.  General -obese pleasant middle-aged Caucasian female, in no apparent distress.  Ophthalmologic - Fundi exam deferred  Cardiovascular - Regular rate and rhythm with no murmur.  Mental Status -  Level of arousal and orientation to time, place, and person were intact. Language including expression, naming, repetition, comprehension was assessed and found intact. Fund of Knowledge was assessed and was intact.  Cranial Nerves II - XII - II - Visual field intact OU. III, IV, VI - Extraocular movements intact. V - Facial sensation intact bilaterally. VII -mild left facial droop. VIII - Hearing & vestibular intact bilaterally. X - Palate elevates symmetrically. XI - Chin turning & shoulder shrug intact bilaterally. XII - Tongue protrusion intact.  Motor Strength - The patient's strength was 3+/5 left UE with difficulty finger extension with spasticity and 2+/5 left hip flexor, 4/5 left  quadriceps, weak ankle dorsiflexion 5/5 RUE and RLE.  Bulk was normal and fasciculations were absent.   Motor Tone - Muscle tone was assessed at the neck and appendages and was increased at left elbow and left fingers.  Reflexes - The patient's reflexes were 1+ in all extremities and she had no pathological reflexes.  Sensory - Light touch, temperature/pinprick were assessed and were normal.    Coordination - The patient had normal movements in the right hand with no ataxia or dysmetria.  Tremor was absent.  Gait and Station - in wheelchair, patient states she is unable to walk therefore gait deferred.   Data reviewed: I personally reviewed the images and agree with the radiology interpretations.  No recent pertinent imaging    Assessment: Danielle Vargas is a 67 year old female with history of right MCA infarct on 03/2016 with right M1 stenosis/occlusion and loop recorder placed to rule out atrial fibrillation.  Vascular risk factors include HTN, DM, HLD, CAD, MI, obesity, and OSA on CPAP.  She is currently followed in this office by Dr. Brett Fairy for severe OSA and CPAP management.  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    Plan:  - continue plavix and aspirin and crestor for secondary stroke prevention and due to recent MI - follow up with Dr. Letta Pate as scheduled for continued Botox injections -f/u with PCP regarding HTN and HLD management -f/u with endocrinologist regarding DM management - follow up with Dr. Brett Fairy for OSA and CPAP treatment along with ensuring CPAP mask is placed nightly -Advised to continue to receive PROM at facility due to continued left hemiparesis with spasticity along with ensuring pillow was placed under left upper extremity to ensure elevation and easier PROM -Recommend maintain blood pressure goal 120-150/80, diabetes with hemoglobin A1c goal  below 7.0% and lipids with LDL cholesterol goal below 70 mg/dL.  - continue loop recorder monitoring  - follow up in 6 months.  Will call earlier if needed with questions, concerns or need of sooner follow-up appointment  I spent more than 25 minutes of face to face time with the patient. Greater than 50% of time was spent in counseling and coordination of care. We discussed BP goal, decreased coreg does, follow up with PMR and aggressive working with therapy.  Venancio Poisson, AGNP-BC  [facility] [address] [place], [place] [address]  Phone [phone] Fax [phone] Note: This document was prepared with digital dictation and possible smart phrase technology. Any transcriptional errors that result from this process are unintentional.

## 2018-07-05 ENCOUNTER — Encounter: Payer: Self-pay | Admitting: Cardiology

## 2018-07-28 ENCOUNTER — Ambulatory Visit (INDEPENDENT_AMBULATORY_CARE_PROVIDER_SITE_OTHER): Payer: Medicare Other

## 2018-07-28 DIAGNOSIS — I639 Cerebral infarction, unspecified: Secondary | ICD-10-CM

## 2018-07-29 NOTE — Progress Notes (Signed)
[facility] Summary Report / Loop Recorder 

## 2018-08-17 ENCOUNTER — Ambulatory Visit (INDEPENDENT_AMBULATORY_CARE_PROVIDER_SITE_OTHER): Payer: Medicare Other | Admitting: Internal Medicine

## 2018-08-17 ENCOUNTER — Encounter: Payer: Self-pay | Admitting: Internal Medicine

## 2018-08-17 VITALS — BP 130/80 | HR 85

## 2018-08-17 DIAGNOSIS — E039 Hypothyroidism, unspecified: Secondary | ICD-10-CM | POA: Diagnosis not present

## 2018-08-17 DIAGNOSIS — E139 Other specified diabetes mellitus without complications: Secondary | ICD-10-CM

## 2018-08-17 DIAGNOSIS — I639 Cerebral infarction, unspecified: Secondary | ICD-10-CM

## 2018-08-17 LAB — POCT GLYCOSYLATED HEMOGLOBIN (HGB A1C): Hemoglobin A1C: 8.9 % — AB (ref 4.0–5.6)

## 2018-08-17 MED ORDER — INSULIN GLARGINE 100 UNIT/ML ~~LOC~~ SOLN: 70.0000 [IU] | Freq: Two times a day (BID) | SUBCUTANEOUS | Status: DC

## 2018-08-17 NOTE — Patient Instructions (Addendum)
  Please increase: - Lantus to 70 units 2x a day  Please continue: - Novolin R:  Before b'fast: 70 units before breakfast  Before lunch: 70 units before lunch  Before dinner: 55 units before dinner If sugars before a meal 70-90, then only give Novolog 30 units before that meal. If sugars before a meal <70, then skip Novolog before that meal.  Please call MD with sugars >500; <50.  Please stop dessert with dinner.   No juices.  Please return in 3-4 months with your sugar log.

## 2018-08-17 NOTE — Progress Notes (Signed)
Patient ID: Danielle Vargas, female   DOB: 11-18-1950, 67 y.o.   MRN: [id number]   HPI: Danielle Vargas is a 67 y.o.-year-old woman, returning for f/u for of DM - LADA dx 1999, insulin-dependent since 10/2012, uncontrolled, with complications (CAD- s/p STEMI 10/2013, STEMI 2018, AMI 02/2018; cerebro-vascular ds. 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 Last visit 4 mo ago.  She is here with her daughter who offers part of the history especially about diet and blood sugars. She has Danielle Vargas.   She continues to reside in a SNF after her stroke.  She stopped OJ since last OV >> sugars started to improve afterwards.  However, in the last few weeks, she again relaxed her diet and still eating sweets and bread.   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   She is on: - Lantus 70 >> 60 units 2x a day (dose was not increased by the facility as suggested at last visit) - Novolin R:  Before b'fast: 70 units before breakfast  Before lunch: 70 units before lunch  Before dinner: 55 units before dinner If sugars before a meal 70-90, then only give Novolog 30 units before that meal. If sugars before a meal <70, then skip Novolog before that meal.  She has been on Glipizide in the past, too. She tried Invokana in the past >> severe diarrhea Also tried U500 >> could not afford this.   Patient has her sugars checked 4 times a day at the  facility: - am:  173-374 >> 179- 342 >> 200-300 >> 230-368 - 2h after b'fast: 115-205, 239 >> n/c - before lunch:  202-383 >> 200-300s >> 174-392 - 2h after lunch:  57x1, 99-247 >> n/c - before dinner:  249- 305 >> 300s >> 220-389 - 2h after dinner:  111-214 >> 109-273, 339 >> n/c - bedtime:  208-348 >> 190- 313, 497 >> n/c - nighttime: 75-232 >> 136-291 >> n/c Lowest 172 >> 91 x1 >> 179 >> 125 >>  174 she has hypoglycemia awareness in the 90s. Highest sugar was 434 x1 >> 497 >> 425 >> 392.   -+ CKD, last BUN/creatinine:  Lab Results  Component Value Date   BUN 19 02/03/2018   Lab Results  Component Value Date   CREATININE 1.20 (H) 02/03/2018    Prev:    Lab Results  Component Value Date    BUN 17 07/07/2017    CREATININE 1.27 (H) 07/07/2017    On losartan. -+ HL: Lab Results  Component Value Date   CHOL 112 02/03/2018   HDL 19 (L) 02/03/2018   LDLCALC 40 02/03/2018   TRIG 263 (H) 02/03/2018   CHOLHDL 5.9 02/03/2018  02/2015: TG 252 (PCP). On Crestor 20 and fenofibrate. - last eye exam was in  02/2017: + DR- Dr Zigmund Daniel.  -+ Numbness and tingling in her feet.   Continues Neurontin.   She also has a history of HTN,  fatty liver, GERD, morbid obesity, hypothyroidism, migraines, chronic back pain, history of nephrolithiasis, osteoporosis, diverticulosis.  She has OSA >> on CPAP.  She also had pancreatitis (03/21/2015).  She has been admitted for CP 11/13/2015 >> r/o for AMI. She saw Dr Sallyanne Kuster in the hospital, her cardiologist is Dr. Tamala Julian. 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 She is still in a wheelchair. Continues to live in [facility].  She also has a history of hypothyroidism.  Latest TSH was normal: Lab Results  Component Value Date   TSH 3.969 02/03/2018    On levothyroxine 100 mcg daily.  - on MVI and Protonix 2.5 hours later, which she was also getting at the time of the normal TFTs in 01/2018  ROS: Constitutional: no weight gain/no weight loss, no  fatigue, no subjective hyperthermia, no subjective hypothermia Eyes: no blurry vision, no xerophthalmia ENT: no sore throat, no nodules palpated in neck, no dysphagia, no odynophagia, no hoarseness Cardiovascular: no CP/no SOB/no palpitations/no leg swelling Respiratory: no cough/no SOB/no wheezing Gastrointestinal: no N/no V/no D/no C/no acid reflux Musculoskeletal: no muscle aches/no joint aches Skin: no rashes, no hair loss Neurological: no tremors/+ numbness/+ tingling/no dizziness  I reviewed pt's medications, allergies, PMH, social hx, family hx, and changes were documented in the history of present illness. Otherwise, unchanged from my initial visit note.  Past Medical History:  Diagnosis Date  . Acute combined systolic and diastolic heart failure ([place]) 11/19/2016  . Allergy   . Asthma   . CAD (coronary artery disease)    a. s/p lateral STEMI in 2015 2ry to occlusion of OM1 thought to be caused by coronary artery dissection, treated with POBA. b. 11/2016: STEMI with thrombotic occlusion of distal LCx which was treated with balloon angioplasty.   . Cataract    "just beginning"  . Colon polyps 7/07   hyperplastic and adenomatous  . Depression    pt unsure, psychologist said no  . Diverticulosis   . DM (diabetes mellitus) ([place])   . Dyslipidemia   . External hemorrhoid   . Fatty liver   . Gastroparesis   . HTN (hypertension)   . Hyperlipidemia   . 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 Hypothyroidism   . Migraine   . Myocardial infarction ([place])    FEb 2015  . Nephrolithiasis   . Obesity   . Osteoporosis   . Sleep apnea    wears CPAP  . ST elevation myocardial infarction (STEMI) of inferolateral wall ([place]) 11/19/2016  . Stroke [facility])    Past Surgical History:  Procedure Laterality Date  . BREAST REDUCTION SURGERY  '88  . CARPAL TUNNEL RELEASE  2000   right wrist  . CERVICAL DISC SURGERY  '98   fusion  . CESAREAN SECTION  '79  '84  . COLONOSCOPY    . CORONARY  BALLOON ANGIOPLASTY N/A 11/19/2016   Procedure: Coronary Balloon Angioplasty;  Surgeon: Sherren Mocha, MD;  Location: [facility];  Service: Cardiovascular;  Laterality: N/A;  . EP IMPLANTABLE DEVICE N/A 03/12/2016   Procedure: Loop Recorder Insertion;  Surgeon: Will Meredith Leeds, MD;  Location: [facility];  Service: Cardiovascular;  Laterality: N/A;  . LEFT HEART CATH AND CORONARY ANGIOGRAPHY N/A 11/19/2016   Procedure: Left Heart Cath and Coronary Angiography;  Surgeon: Sherren Mocha, MD;  Location: [facility];  Service: Cardiovascular;  Laterality: N/A;  . LEFT HEART CATH AND CORONARY ANGIOGRAPHY N/A 02/03/2018   Procedure: LEFT HEART CATH AND CORONARY ANGIOGRAPHY;  Surgeon: Belva Crome, MD;  Location: [facility];  Service: Cardiovascular;  Laterality: N/A;  . LEFT HEART CATHETERIZATION WITH CORONARY ANGIOGRAM N/A 10/26/2013   Procedure: LEFT HEART CATHETERIZATION WITH CORONARY ANGIOGRAM;  Surgeon: Sinclair Grooms, MD;  Location: [facility] CATH LAB;  Service: Cardiovascular;  Laterality: N/A;  . ROTATOR CUFF REPAIR  '09   left shoulder  . TEE WITHOUT CARDIOVERSION N/A 03/12/2016   Procedure: TRANSESOPHAGEAL ECHOCARDIOGRAM (TEE);  Surgeon: Sanda Klein, MD;  Location: [facility] ENDOSCOPY;  Service: Cardiovascular;  Laterality: N/A;  . TUBAL LIGATION  '85  . ULNAR NERVE REPAIR  '02, '08   left done, then right   Social History   Socioeconomic History  . Marital status: Divorced    Spouse name: Not on file  . Number of children: 2  . Years of education: Not on file  . Highest education level: Not on file  Occupational History  . Occupation: disabled    Fish farm manager: UNEMPLOYED  Social Needs  . Financial resource strain: Not on file  . Food insecurity:    Worry: Not on file    Inability: Not on file  . Transportation needs:    Medical: Not on file    Non-medical: Not on file  Tobacco Use  . Smoking status: Never Smoker  . Smokeless tobacco: Never Used  Substance  and Sexual Activity  . Alcohol use: No  . Drug use: No  . Sexual activity: Not Currently    Birth control/protection: Post-menopausal  Lifestyle  . Physical activity:    Days per week: Not on file    Minutes per session: Not on file  . Stress: Not on file  Relationships  . Social connections:    Talks on phone: Not on file    Gets together: Not on file    Attends religious service: Not on file    Active member of club or organization: Not on file    Attends meetings of clubs or organizations: Not on file    Relationship status: Not on file  . Intimate partner violence:    Fear of current or ex partner: Not on file    Emotionally abused: Not on file    Physically abused: Not on file    Forced sexual activity: Not on file  Other Topics Concern  . Not on file  Social History Narrative   Lives in [facility] [place].  Retired.  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   Regular exercise: none   Caffeine use: daily; dt coke         Current Outpatient Medications on File Prior to Visit  Medication Sig Dispense Refill  . acetaminophen (TYLENOL) 325 MG tablet Take 650 mg by mouth every 6 (six) hours as needed for mild pain.     Marland Kitchen albuterol (PROVENTIL HFA;VENTOLIN HFA) 108 (90 BASE) MCG/ACT inhaler Inhale 2 puffs into the lungs every 6 (six) hours as needed for wheezing or shortness of breath.     Marland Kitchen aspirin EC 81 MG EC tablet Take 1 tablet (81 mg total) by mouth daily.    . budesonide-formoterol (SYMBICORT) 160-4.5 MCG/ACT inhaler Inhale 1 puff into the lungs 2 (two) times daily.     . carvedilol (COREG) 6.25 MG tablet Take 6.25 mg by mouth 2 (two) times daily with a meal.    . clopidogrel (PLAVIX) 75 MG tablet Take 1 tablet (75 mg total) by mouth daily with breakfast. 30 tablet 11  . docusate sodium (COLACE)  100 MG capsule Take 100 mg by mouth 2 (two) times daily.    . fenofibrate 160 MG tablet Take 160 mg by mouth at bedtime.     . furosemide (LASIX) 40 MG tablet Take 40 mg  by mouth every morning.    . gabapentin (NEURONTIN) 300 MG capsule Take 300 mg by mouth 2 (two) times daily.     Marland Kitchen guaiFENesin 200 MG tablet Take 600 mg by mouth 2 (two) times daily as needed for cough or to loosen phlegm.    . insulin glargine (LANTUS) 100 UNIT/ML injection Inject 50 units in am and 45 units at bedtime. (Patient taking differently: Inject 60 Units into the skin at bedtime. ) 20 mL 11  . insulin regular (NOVOLIN R,HUMULIN R) 100 units/mL injection Inject 55-70 Units into the skin 3 (three) times daily before meals. Inject 70 units into the skin before breakfast, 70 units into the skin before lunch, and 55 units into the skin before dinner.    . isosorbide mononitrate (IMDUR) 30 MG 24 hr tablet Take 30 mg by mouth daily.    Marland Kitchen levothyroxine (SYNTHROID, LEVOTHROID) 100 MCG tablet Take 1 tablet (100 mcg total) by mouth daily before breakfast.    . metoCLOPramide (REGLAN) 10 MG tablet Take 10 mg by mouth 2 (two) times daily.     . mometasone (NASONEX) 50 MCG/ACT nasal spray Place 2 sprays into the nose daily. 17 g 12  . montelukast (SINGULAIR) 10 MG tablet Take 10 mg by mouth at bedtime.    . Multiple Vitamin (MULTIVITAMIN WITH MINERALS) TABS tablet Take 1 tablet by mouth daily. One A Day 50+    . nitroGLYCERIN (NITROSTAT) 0.4 MG SL tablet Place 1 tablet (0.4 mg total) under the tongue every 5 (five) minutes as needed for chest pain. 25 tablet 3  . OXYGEN Inhale 2 L into the lungs at bedtime as needed (dyspnea).     . pantoprazole (PROTONIX) 40 MG tablet Take 1 tablet (40 mg total) by mouth daily.    . polyethylene glycol (MIRALAX / GLYCOLAX) packet Take 17 g by mouth every other day.     . rosuvastatin (CRESTOR) 20 MG tablet Take 20 mg by mouth daily.     Marland Kitchen tiZANidine (ZANAFLEX) 2 MG tablet Take 2 mg by mouth every 8 (eight) hours as needed for muscle spasms.     Marland Kitchen topiramate (TOPAMAX) 50 MG tablet Take 50 mg by mouth 2 (two) times daily.     Current Facility-Administered Medications  on File Prior to Visit  Medication Dose Route Frequency Provider Last Rate Last Dose  . mupirocin cream (BACTROBAN) 2 %   Topical BID Edrick Kins, DPM       Allergies  Allergen Reactions  . Invokana [Canagliflozin] Diarrhea  . Nsaids Other (See Comments)    GI problems/ stomach pain  . Penicillins Rash    Has patient had a PCN reaction causing immediate rash, facial/tongue/throat swelling, SOB or lightheadedness with hypotension: No (rash on legs after 3 days [place] -June 2016) Has patient had a PCN reaction causing severe rash involving mucus membranes or skin necrosis: No Has patient had a PCN reaction that required hospitalization No Has patient had a PCN reaction occurring within the last 10 years: Yes If all of the above answers are "NO", then may proceed with Cephalosporin use.   Family History  Problem Relation Age of Onset  . COPD Father   . Stroke Father   . Emphysema Father   .  Heart failure Mother   . Heart attack Mother        several  . Diabetes Mother   . Kidney disease Mother   . Heart failure Brother   . Hypertension Brother   . Colon cancer Unknown 45       mat. 1st cousin  . Other Unknown        celiac sprue, 1/2 brother  . Diabetes Brother        x 3  . Lung cancer Brother   . Cirrhosis Sister        liver transplant  . Liver disease Sister        transplant  . Heart attack Paternal Grandfather   . Heart attack Brother   . Colon cancer Paternal Aunt 52  . Esophageal cancer Neg Hx   . Stomach cancer Neg Hx   . Rectal cancer Neg Hx     PE: There were no vitals taken for this visit. There is no height or weight on file to calculate BMI.  Patient is in a wheelchair and could not be weighed. Wt Readings from Last 3 Encounters:  03/29/18 208 lb (94.3 kg)  03/25/18 201 lb (91.2 kg)  02/05/18 211 lb 10.3 oz (96 kg)   Constitutional: overweight, in NAD, in wheelchair Eyes: PERRLA, EOMI, no exophthalmos ENT: moist mucous membranes, no thyromegaly, no  cervical lymphadenopathy Cardiovascular: RRR, No MRG Respiratory: CTA B Gastrointestinal: abdomen soft, NT, ND, BS+ Musculoskeletal: no deformities, strength intact in all 4 Skin: moist, warm, no rashes + onychodystrophy left hand Neurological: no tremor with outstretched hands, DTR normal in all 4, + left hemiparesis  ASSESSMENT: 1. DM2, insulin-dependent, uncontrolled, with complications - CAD - STEMI 10/26/2013 - had angioplasty, no stent placed. Had 2DEcho on 01/23/2014 EF increased (from 35) to 55%.             - STEMI 11/19/2016. - AMI 02/2018 - cerebro-vascular ds. 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  2. Obesity  3. Hypothyroidism  PLAN:  1. Patient with longstanding, uncontrolled, LADA, with increased insulin resistance, on high doses of insulin in the basal-bolus regimen.  Sugars are higher at last visit but there was no sugar log sent from the facility so we relied on the patient and her daughter's recall.  Sugars were apparently in the 200s to 300s. -At last visit we increased her Lantus to 70 units twice a day but we maintained the same dose of NovoLog.  We did discuss about changing diet and I advised the facility did not give her dessert or bread. -We discussed in the past about GLP-1 receptor agonists per her questioning, however, this is contraindicated with a history of pancreatitis and also not the best option in a patient with gastroparesis. -At this visit, the daughter tells me that her sugar improved after last visit, especially after she eliminated orange juice in the morning.  She also started to make other dietary changes, for example refusing dessert after our last visit, but in the last weeks, she relaxed her diet again.  Reviewing sugars from this month sent by the facility, they are still  quite high, in the 200s and 300s. -Reviewing the MAR, the facility did not increase the Lantus as advised at last visit.  We will increase  the dose now, since sugars are high.  However, we discussed with patient and her daughter that it would be very important for her to continue to improve her diet to be able to bring her sugars down.  She is still getting bread and desserts.  We discussed to at least not get the dessert after dinner.  I also advised her to only eat half of the slice of bread that she is getting with a meal.  Also, continue to drink no juice.  -  I suggested:  Patient Instructions   Please increase: - Lantus to 70 units 2x a day  Please continue: - Novolin R:  Before b'fast: 70 units before breakfast  Before lunch: 70 units before lunch  Before dinner: 55 units before dinner If sugars before a meal 70-90, then only give Novolog 30 units before that meal. If sugars before a meal <70, then skip Novolog before that meal.  Please call MD with sugars >500; <50.  Please stop dessert with dinner.   No juices.  Please return in 3-4 months with your sugar log.   - today, HbA1c is 8.9% (improved) - continue checking sugars at different times of the day - check 3x a day, rotating checks - advised for yearly eye exams >> she is not UTD - Return to clinic in 3-4 mo with sugar log   2. Obesity -class 3 -Exacerbated by her immobility -Unfortunately, we cannot use a GLP-1 receptor agonist or SGLT2 inhibitors so we have to rely on increasing the insulin doses for diabetes control and this will also worsen her obesity. -I again emphasized at this visit the need to eliminate desserts, especially the one after dinner, to stay off juice and to hopefully reduce bread intake.  3. Hypothyroidism - controlled - latest thyroid labs reviewed with pt >> normal 01/2018 - she continues on LT4 100 mcg daily - pt feels good on this dose. - we discussed about taking the thyroid hormone  every day, with water, >30 minutes before breakfast, separated by >4 hours from acid reflux medications, calcium, iron, multivitamins. Pt. is taking it correctly, however, she is given multivitamins and PPI only 2.5 hours after levothyroxine, instead of 4.  Since labs were normal earlier this year on the same regimen, will continue this practice.   Philemon Kingdom, MD PhD [facility]

## 2018-08-30 ENCOUNTER — Ambulatory Visit (INDEPENDENT_AMBULATORY_CARE_PROVIDER_SITE_OTHER): Payer: Medicare Other

## 2018-08-30 DIAGNOSIS — I639 Cerebral infarction, unspecified: Secondary | ICD-10-CM | POA: Diagnosis not present

## 2018-08-30 LAB — CUP PACEART REMOTE DEVICE CHECK
Implantable Pulse Generator Implant Date: 20170705
MDC IDC SESS DTM: 20191223173753

## 2018-08-31 NOTE — Progress Notes (Signed)
[facility] Summary Report / Loop Recorder 

## 2018-09-12 LAB — CUP PACEART REMOTE DEVICE CHECK
Date Time Interrogation Session: 20191120173654
MDC IDC PG IMPLANT DT: 20170705

## 2018-09-13 ENCOUNTER — Encounter (INDEPENDENT_AMBULATORY_CARE_PROVIDER_SITE_OTHER): Payer: Medicare Other | Admitting: Ophthalmology

## 2018-10-14 ENCOUNTER — Other Ambulatory Visit: Payer: Self-pay

## 2018-10-14 ENCOUNTER — Emergency Department (HOSPITAL_COMMUNITY): Payer: Medicare Other

## 2018-10-14 ENCOUNTER — Encounter (HOSPITAL_COMMUNITY): Payer: Self-pay | Admitting: Emergency Medicine

## 2018-10-14 ENCOUNTER — Inpatient Hospital Stay (HOSPITAL_COMMUNITY)
Admission: EM | Admit: 2018-10-14 | Discharge: 2018-10-16 | DRG: 871 | Disposition: A | Discharge status: Skilled Nursing Facility | Payer: Medicare Other | Source: Skilled Nursing Facility | Attending: Internal Medicine | Admitting: Internal Medicine

## 2018-10-14 DIAGNOSIS — G92 Toxic encephalopathy: Secondary | ICD-10-CM | POA: Diagnosis present

## 2018-10-14 DIAGNOSIS — Z79899 Other long term (current) drug therapy: Secondary | ICD-10-CM | POA: Diagnosis not present

## 2018-10-14 DIAGNOSIS — Z79890 Hormone replacement therapy (postmenopausal): Secondary | ICD-10-CM | POA: Diagnosis not present

## 2018-10-14 DIAGNOSIS — Z7951 Long term (current) use of inhaled steroids: Secondary | ICD-10-CM

## 2018-10-14 DIAGNOSIS — G4733 Obstructive sleep apnea (adult) (pediatric): Secondary | ICD-10-CM | POA: Diagnosis not present

## 2018-10-14 DIAGNOSIS — A419 Sepsis, unspecified organism: Secondary | ICD-10-CM | POA: Diagnosis present

## 2018-10-14 DIAGNOSIS — I130 Hypertensive heart and chronic kidney disease with heart failure and stage 1 through stage 4 chronic kidney disease, or unspecified chronic kidney disease: Secondary | ICD-10-CM | POA: Diagnosis present

## 2018-10-14 DIAGNOSIS — R6520 Severe sepsis without septic shock: Secondary | ICD-10-CM | POA: Diagnosis present

## 2018-10-14 DIAGNOSIS — I2510 Atherosclerotic heart disease of native coronary artery without angina pectoris: Secondary | ICD-10-CM | POA: Diagnosis present

## 2018-10-14 DIAGNOSIS — N183 Chronic kidney disease, stage 3 (moderate): Secondary | ICD-10-CM | POA: Diagnosis present

## 2018-10-14 DIAGNOSIS — E876 Hypokalemia: Secondary | ICD-10-CM | POA: Diagnosis present

## 2018-10-14 DIAGNOSIS — Z9981 Dependence on supplemental oxygen: Secondary | ICD-10-CM

## 2018-10-14 DIAGNOSIS — E1022 Type 1 diabetes mellitus with diabetic chronic kidney disease: Secondary | ICD-10-CM | POA: Diagnosis present

## 2018-10-14 DIAGNOSIS — Z833 Family history of diabetes mellitus: Secondary | ICD-10-CM

## 2018-10-14 DIAGNOSIS — I5032 Chronic diastolic (congestive) heart failure: Secondary | ICD-10-CM | POA: Diagnosis not present

## 2018-10-14 DIAGNOSIS — I10 Essential (primary) hypertension: Secondary | ICD-10-CM | POA: Diagnosis present

## 2018-10-14 DIAGNOSIS — N179 Acute kidney failure, unspecified: Secondary | ICD-10-CM

## 2018-10-14 DIAGNOSIS — I69354 Hemiplegia and hemiparesis following cerebral infarction affecting left non-dominant side: Secondary | ICD-10-CM | POA: Diagnosis not present

## 2018-10-14 DIAGNOSIS — E1042 Type 1 diabetes mellitus with diabetic polyneuropathy: Secondary | ICD-10-CM | POA: Diagnosis present

## 2018-10-14 DIAGNOSIS — Z9989 Dependence on other enabling machines and devices: Secondary | ICD-10-CM

## 2018-10-14 DIAGNOSIS — Z794 Long term (current) use of insulin: Secondary | ICD-10-CM

## 2018-10-14 DIAGNOSIS — Z7982 Long term (current) use of aspirin: Secondary | ICD-10-CM | POA: Diagnosis not present

## 2018-10-14 DIAGNOSIS — I5042 Chronic combined systolic (congestive) and diastolic (congestive) heart failure: Secondary | ICD-10-CM | POA: Diagnosis present

## 2018-10-14 DIAGNOSIS — R4182 Altered mental status, unspecified: Secondary | ICD-10-CM | POA: Diagnosis not present

## 2018-10-14 DIAGNOSIS — E039 Hypothyroidism, unspecified: Secondary | ICD-10-CM | POA: Diagnosis not present

## 2018-10-14 DIAGNOSIS — I252 Old myocardial infarction: Secondary | ICD-10-CM

## 2018-10-14 DIAGNOSIS — G9341 Metabolic encephalopathy: Secondary | ICD-10-CM

## 2018-10-14 DIAGNOSIS — R52 Pain, unspecified: Secondary | ICD-10-CM

## 2018-10-14 DIAGNOSIS — Z8249 Family history of ischemic heart disease and other diseases of the circulatory system: Secondary | ICD-10-CM

## 2018-10-14 DIAGNOSIS — Z7902 Long term (current) use of antithrombotics/antiplatelets: Secondary | ICD-10-CM | POA: Diagnosis not present

## 2018-10-14 DIAGNOSIS — E669 Obesity, unspecified: Secondary | ICD-10-CM | POA: Diagnosis present

## 2018-10-14 DIAGNOSIS — J449 Chronic obstructive pulmonary disease, unspecified: Secondary | ICD-10-CM | POA: Diagnosis present

## 2018-10-14 DIAGNOSIS — K760 Fatty (change of) liver, not elsewhere classified: Secondary | ICD-10-CM | POA: Diagnosis present

## 2018-10-14 DIAGNOSIS — N390 Urinary tract infection, site not specified: Secondary | ICD-10-CM

## 2018-10-14 DIAGNOSIS — K219 Gastro-esophageal reflux disease without esophagitis: Secondary | ICD-10-CM | POA: Diagnosis present

## 2018-10-14 DIAGNOSIS — L600 Ingrowing nail: Secondary | ICD-10-CM

## 2018-10-14 LAB — URINALYSIS, ROUTINE W REFLEX MICROSCOPIC
Bilirubin Urine: NEGATIVE
GLUCOSE, UA: NEGATIVE mg/dL
Ketones, ur: NEGATIVE mg/dL
Nitrite: NEGATIVE
PH: 7 (ref 5.0–8.0)
Protein, ur: 100 mg/dL — AB
SPECIFIC GRAVITY, URINE: 1.015 (ref 1.005–1.030)

## 2018-10-14 LAB — URINALYSIS, MICROSCOPIC (REFLEX)
RBC / HPF: >50 RBC/hpf (ref 0–5)
WBC, UA: >50 WBC/hpf (ref 0–5)

## 2018-10-14 LAB — COMPREHENSIVE METABOLIC PANEL
ALT: 27 U/L (ref 0–44)
AST: 48 U/L — ABNORMAL HIGH (ref 15–41)
Albumin: 2.8 g/dL — ABNORMAL LOW (ref 3.5–5.0)
Alkaline Phosphatase: 47 U/L (ref 38–126)
Anion gap: 19 — ABNORMAL HIGH (ref 5–15)
BUN: 24 mg/dL — ABNORMAL HIGH (ref 8–23)
CO2: 18 mmol/L — AB (ref 22–32)
Calcium: 9 mg/dL (ref 8.9–10.3)
Chloride: 105 mmol/L (ref 98–111)
Creatinine, Ser: 1.86 mg/dL — ABNORMAL HIGH (ref 0.44–1.00)
GFR calc Af Amer: 32 mL/min — ABNORMAL LOW (ref >60)
GFR calc non Af Amer: 28 mL/min — ABNORMAL LOW (ref >60)
GLUCOSE: 93 mg/dL (ref 70–99)
Potassium: 3.3 mmol/L — ABNORMAL LOW (ref 3.5–5.1)
Sodium: 142 mmol/L (ref 135–145)
Total Bilirubin: 1.3 mg/dL — ABNORMAL HIGH (ref 0.3–1.2)
Total Protein: 7.5 g/dL (ref 6.5–8.1)

## 2018-10-14 LAB — CBC WITH DIFFERENTIAL/PLATELET
Abs Immature Granulocytes: 0 10*3/uL (ref 0.00–0.07)
Basophils Absolute: 0 10*3/uL (ref 0.0–0.1)
Basophils Relative: 0 %
Eosinophils Absolute: 0 10*3/uL (ref 0.0–0.5)
Eosinophils Relative: 0 %
HCT: 42.5 % (ref 36.0–46.0)
HEMOGLOBIN: 13.8 g/dL (ref 12.0–15.0)
Lymphocytes Relative: 7 %
Lymphs Abs: 1.1 10*3/uL (ref 0.7–4.0)
MCH: 31.7 pg (ref 26.0–34.0)
MCHC: 32.5 g/dL (ref 30.0–36.0)
MCV: 97.7 fL (ref 80.0–100.0)
Monocytes Absolute: 0.6 10*3/uL (ref 0.1–1.0)
Monocytes Relative: 4 %
Neutro Abs: 14 10*3/uL — ABNORMAL HIGH (ref 1.7–7.7)
Neutrophils Relative %: 89 %
Platelets: 284 10*3/uL (ref 150–400)
RBC: 4.35 MIL/uL (ref 3.87–5.11)
RDW: 14.3 % (ref 11.5–15.5)
WBC: 15.7 10*3/uL — AB (ref 4.0–10.5)
nRBC: 0 % (ref 0.0–0.2)
nRBC: 0 /100 WBC

## 2018-10-14 LAB — INFLUENZA PANEL BY PCR (TYPE A & B)
Influenza A By PCR: NEGATIVE
Influenza B By PCR: NEGATIVE

## 2018-10-14 LAB — POCT I-STAT EG7
Acid-base deficit: 2 mmol/L (ref 0.0–2.0)
Bicarbonate: 21.9 mmol/L (ref 20.0–28.0)
Calcium, Ion: 1.13 mmol/L — ABNORMAL LOW (ref 1.15–1.40)
HEMATOCRIT: 40 % (ref 36.0–46.0)
Hemoglobin: 13.6 g/dL (ref 12.0–15.0)
O2 Saturation: 80 %
Potassium: 3.2 mmol/L — ABNORMAL LOW (ref 3.5–5.1)
Sodium: 142 mmol/L (ref 135–145)
TCO2: 23 mmol/L (ref 22–32)
pCO2, Ven: 32.8 mmHg — ABNORMAL LOW (ref 44.0–60.0)
pH, Ven: 7.434 — ABNORMAL HIGH (ref 7.250–7.430)
pO2, Ven: 43 mmHg (ref 32.0–45.0)

## 2018-10-14 LAB — AMMONIA: Ammonia: 39 umol/L — ABNORMAL HIGH (ref 9–35)

## 2018-10-14 LAB — CBG MONITORING, ED: Glucose-Capillary: 87 mg/dL (ref 70–99)

## 2018-10-14 LAB — TYPE AND SCREEN
ABO/RH(D): A NEG
ANTIBODY SCREEN: NEGATIVE

## 2018-10-14 LAB — ABO/RH: ABO/RH(D): A NEG

## 2018-10-14 LAB — I-STAT TROPONIN, ED: Troponin i, poc: 0 ng/mL (ref 0.00–0.08)

## 2018-10-14 LAB — PROTIME-INR
INR: 1.32
Prothrombin Time: 16.3 seconds — ABNORMAL HIGH (ref 11.4–15.2)

## 2018-10-14 LAB — LACTIC ACID, PLASMA: Lactic Acid, Venous: 2.1 mmol/L (ref 0.5–1.9)

## 2018-10-14 LAB — GLUCOSE, CAPILLARY: Glucose-Capillary: 106 mg/dL — ABNORMAL HIGH (ref 70–99)

## 2018-10-14 LAB — MAGNESIUM: Magnesium: 2 mg/dL (ref 1.7–2.4)

## 2018-10-14 MED ORDER — ONDANSETRON HCL 4 MG PO TABS: 4.0000 mg | ORAL_TABLET | Freq: Four times a day (QID) | ORAL | Status: DC | PRN

## 2018-10-14 MED ORDER — LACTATED RINGERS IV BOLUS
1000.0000 mL | Freq: Once | INTRAVENOUS | Status: AC
  Administered 2018-10-14: 1000 mL via INTRAVENOUS

## 2018-10-14 MED ORDER — SODIUM CHLORIDE 0.9 % IV SOLN: 2.0000 g | Freq: Once | INTRAVENOUS | Status: DC

## 2018-10-14 MED ORDER — ACETAMINOPHEN 650 MG RE SUPP
650.0000 mg | Freq: Once | RECTAL | Status: AC
  Administered 2018-10-14: 650 mg via RECTAL
  Filled 2018-10-14: qty 1

## 2018-10-14 MED ORDER — TALC EX POWD: 1.0000 "application " | CUTANEOUS | Status: DC

## 2018-10-14 MED ORDER — MOMETASONE FURO-FORMOTEROL FUM 200-5 MCG/ACT IN AERO
2.0000 | INHALATION_SPRAY | Freq: Two times a day (BID) | RESPIRATORY_TRACT | Status: DC
  Administered 2018-10-15 – 2018-10-16 (×2): 2 via RESPIRATORY_TRACT
  Filled 2018-10-14: qty 8.8

## 2018-10-14 MED ORDER — INSULIN ASPART 100 UNIT/ML ~~LOC~~ SOLN
0.0000 [IU] | Freq: Three times a day (TID) | SUBCUTANEOUS | Status: DC
  Administered 2018-10-15: 3 [IU] via SUBCUTANEOUS
  Administered 2018-10-15: 8 [IU] via SUBCUTANEOUS
  Administered 2018-10-15: 11 [IU] via SUBCUTANEOUS
  Administered 2018-10-16: 3 [IU] via SUBCUTANEOUS

## 2018-10-14 MED ORDER — ADULT MULTIVITAMIN W/MINERALS CH
1.0000 | ORAL_TABLET | Freq: Every day | ORAL | Status: DC
  Administered 2018-10-15 – 2018-10-16 (×3): 1 via ORAL
  Filled 2018-10-14 (×2): qty 1

## 2018-10-14 MED ORDER — GUAIFENESIN 200 MG PO TABS
600.0000 mg | ORAL_TABLET | Freq: Two times a day (BID) | ORAL | Status: DC | PRN
  Filled 2018-10-14: qty 3

## 2018-10-14 MED ORDER — LEVOTHYROXINE SODIUM 100 MCG PO TABS
100.0000 ug | ORAL_TABLET | Freq: Every day | ORAL | Status: DC
  Administered 2018-10-15 – 2018-10-16 (×2): 100 ug via ORAL
  Filled 2018-10-14 (×2): qty 1

## 2018-10-14 MED ORDER — POTASSIUM CHLORIDE 10 MEQ/100ML IV SOLN
10.0000 meq | INTRAVENOUS | Status: AC
  Administered 2018-10-14 – 2018-10-15 (×4): 10 meq via INTRAVENOUS
  Filled 2018-10-14 (×4): qty 100

## 2018-10-14 MED ORDER — TOPIRAMATE 25 MG PO TABS
50.0000 mg | ORAL_TABLET | Freq: Two times a day (BID) | ORAL | Status: DC
  Administered 2018-10-15 – 2018-10-16 (×3): 50 mg via ORAL
  Filled 2018-10-14 (×3): qty 2

## 2018-10-14 MED ORDER — GABAPENTIN 300 MG PO CAPS
300.0000 mg | ORAL_CAPSULE | Freq: Two times a day (BID) | ORAL | Status: DC
  Administered 2018-10-15 – 2018-10-16 (×3): 300 mg via ORAL
  Filled 2018-10-14 (×3): qty 1

## 2018-10-14 MED ORDER — ASPIRIN EC 81 MG PO TBEC
81.0000 mg | DELAYED_RELEASE_TABLET | Freq: Every day | ORAL | Status: DC
  Administered 2018-10-15 – 2018-10-16 (×2): 81 mg via ORAL
  Filled 2018-10-14 (×2): qty 1

## 2018-10-14 MED ORDER — POLYETHYLENE GLYCOL 3350 17 G PO PACK
17.0000 g | PACK | ORAL | Status: DC
  Administered 2018-10-16: 17 g via ORAL
  Filled 2018-10-14: qty 1

## 2018-10-14 MED ORDER — VANCOMYCIN HCL 10 G IV SOLR: 1250.0000 mg | INTRAVENOUS | Status: DC

## 2018-10-14 MED ORDER — CLOPIDOGREL BISULFATE 75 MG PO TABS
75.0000 mg | ORAL_TABLET | Freq: Every day | ORAL | Status: DC
  Administered 2018-10-15 – 2018-10-16 (×2): 75 mg via ORAL
  Filled 2018-10-14 (×2): qty 1

## 2018-10-14 MED ORDER — LEVOTHYROXINE SODIUM 100 MCG PO TABS: 100.0000 ug | ORAL_TABLET | Freq: Every day | ORAL | Status: DC

## 2018-10-14 MED ORDER — MONTELUKAST SODIUM 10 MG PO TABS
10.0000 mg | ORAL_TABLET | Freq: Every day | ORAL | Status: DC
  Administered 2018-10-15: 10 mg via ORAL
  Filled 2018-10-14: qty 1

## 2018-10-14 MED ORDER — GUAIFENESIN-DM 100-10 MG/5ML PO SYRP: 10.0000 mL | ORAL_SOLUTION | Freq: Three times a day (TID) | ORAL | Status: DC | PRN

## 2018-10-14 MED ORDER — ENOXAPARIN SODIUM 40 MG/0.4ML ~~LOC~~ SOLN
40.0000 mg | SUBCUTANEOUS | Status: DC
  Administered 2018-10-14 – 2018-10-15 (×2): 40 mg via SUBCUTANEOUS
  Filled 2018-10-14 (×2): qty 0.4

## 2018-10-14 MED ORDER — VANCOMYCIN HCL IN DEXTROSE 1-5 GM/200ML-% IV SOLN: 1000.0000 mg | Freq: Once | INTRAVENOUS | Status: DC

## 2018-10-14 MED ORDER — SODIUM CHLORIDE 0.9 % IV SOLN
2.0000 g | Freq: Once | INTRAVENOUS | Status: AC
  Administered 2018-10-14: 2 g via INTRAVENOUS
  Filled 2018-10-14: qty 2

## 2018-10-14 MED ORDER — METOCLOPRAMIDE HCL 10 MG PO TABS
10.0000 mg | ORAL_TABLET | Freq: Two times a day (BID) | ORAL | Status: DC
  Administered 2018-10-15 – 2018-10-16 (×3): 10 mg via ORAL
  Filled 2018-10-14 (×3): qty 1

## 2018-10-14 MED ORDER — INSULIN GLARGINE 100 UNIT/ML ~~LOC~~ SOLN
30.0000 [IU] | Freq: Two times a day (BID) | SUBCUTANEOUS | Status: DC
  Administered 2018-10-14 – 2018-10-16 (×4): 30 [IU] via SUBCUTANEOUS
  Filled 2018-10-14 (×4): qty 0.3

## 2018-10-14 MED ORDER — ACETAMINOPHEN 650 MG RE SUPP: 650.0000 mg | Freq: Four times a day (QID) | RECTAL | Status: DC | PRN

## 2018-10-14 MED ORDER — ONDANSETRON HCL 4 MG/2ML IJ SOLN: 4.0000 mg | Freq: Four times a day (QID) | INTRAMUSCULAR | Status: DC | PRN

## 2018-10-14 MED ORDER — DOCUSATE SODIUM 100 MG PO CAPS
100.0000 mg | ORAL_CAPSULE | Freq: Two times a day (BID) | ORAL | Status: DC
  Administered 2018-10-15 – 2018-10-16 (×2): 100 mg via ORAL
  Filled 2018-10-14 (×2): qty 1

## 2018-10-14 MED ORDER — PANTOPRAZOLE SODIUM 20 MG PO TBEC
20.0000 mg | DELAYED_RELEASE_TABLET | Freq: Every day | ORAL | Status: DC
  Administered 2018-10-15 – 2018-10-16 (×2): 20 mg via ORAL
  Filled 2018-10-14 (×2): qty 1

## 2018-10-14 MED ORDER — NYSTATIN 100000 UNIT/GM EX POWD
Freq: Two times a day (BID) | CUTANEOUS | Status: DC
  Administered 2018-10-14 – 2018-10-16 (×4): via TOPICAL
  Filled 2018-10-14: qty 15

## 2018-10-14 MED ORDER — CHLORHEXIDINE GLUCONATE 0.12 % MT SOLN
15.0000 mL | Freq: Two times a day (BID) | OROMUCOSAL | Status: DC
  Administered 2018-10-14 – 2018-10-16 (×4): 15 mL via OROMUCOSAL
  Filled 2018-10-14 (×4): qty 15

## 2018-10-14 MED ORDER — VANCOMYCIN HCL 10 G IV SOLR
2000.0000 mg | Freq: Once | INTRAVENOUS | Status: AC
  Administered 2018-10-14: 2000 mg via INTRAVENOUS
  Filled 2018-10-14: qty 2000

## 2018-10-14 MED ORDER — PHENYLEPHRINE-DM-GG 2.5-5-100 MG/5ML PO LIQD: 15.0000 mL | Freq: Three times a day (TID) | ORAL | Status: DC

## 2018-10-14 MED ORDER — FLUTICASONE PROPIONATE 50 MCG/ACT NA SUSP
1.0000 | Freq: Every day | NASAL | Status: DC
  Administered 2018-10-15 – 2018-10-16 (×2): 1 via NASAL
  Filled 2018-10-14: qty 16

## 2018-10-14 MED ORDER — MUPIROCIN CALCIUM 2 % EX CREA
TOPICAL_CREAM | Freq: Two times a day (BID) | CUTANEOUS | Status: DC
  Administered 2018-10-15 – 2018-10-16 (×3): via TOPICAL
  Filled 2018-10-14 (×2): qty 15

## 2018-10-14 MED ORDER — SODIUM CHLORIDE 0.9 % IV SOLN
INTRAVENOUS | Status: DC
  Administered 2018-10-14 – 2018-10-15 (×2): via INTRAVENOUS

## 2018-10-14 MED ORDER — ACETAMINOPHEN 325 MG PO TABS: 650.0000 mg | ORAL_TABLET | Freq: Four times a day (QID) | ORAL | Status: DC | PRN

## 2018-10-14 MED ORDER — ALBUTEROL SULFATE (2.5 MG/3ML) 0.083% IN NEBU: 2.5000 mg | INHALATION_SOLUTION | Freq: Four times a day (QID) | RESPIRATORY_TRACT | Status: DC | PRN

## 2018-10-14 MED ORDER — SODIUM CHLORIDE 0.9 % IV SOLN
2.0000 g | INTRAVENOUS | Status: DC
  Administered 2018-10-15: 2 g via INTRAVENOUS
  Filled 2018-10-14 (×2): qty 2

## 2018-10-14 NOTE — ED Provider Notes (Signed)
[facility] EMERGENCY DEPARTMENT Provider Note   CSN: [id number] Arrival date & time: 10/14/18  1428     History   Chief Complaint Chief Complaint  Patient presents with  . Altered Mental Status    HPI Danielle Vargas is a 68 y.o. female who presents emergency department for altered mental status.  She is a resident at [facility].  She has a past medical history of type 1 diabetes, obesity CAD, previous CVA with left-sided hemiparesis.  History of previous STEMI.  Apparently she is normally verbal and today became acutely altered, nonverbal with her staff at collapse.  She has had a cough this week.  There is a level 5 caveat due to altered mental status.  Patient has not had any recent trauma or falls.  HPI  Past Medical History:  Diagnosis Date  . Acute combined systolic and diastolic heart failure ([place]) 11/19/2016  . Allergy   . Asthma   . CAD (coronary artery disease)    a. s/p lateral STEMI in 2015 2ry to occlusion of OM1 thought to be caused by coronary artery dissection, treated with POBA. b. 11/2016: STEMI with thrombotic occlusion of distal LCx which was treated with balloon angioplasty.   . Cataract    "just beginning"  . Colon polyps 7/07   hyperplastic and adenomatous  . Depression    pt unsure, psychologist said no  . Diverticulosis   . DM (diabetes mellitus) ([place])   . Dyslipidemia   . External hemorrhoid   . Fatty liver   . Gastroparesis   . HTN (hypertension)   . Hyperlipidemia   . 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 Hypothyroidism   . Migraine   . Myocardial infarction ([place])    FEb 2015  . Nephrolithiasis   . Obesity   . Osteoporosis   . Sleep apnea    wears CPAP  . ST elevation myocardial infarction (STEMI) of inferolateral wall ([place]) 11/19/2016  . Stroke [facility])     Patient Active Problem List   Diagnosis Date Noted  . Sepsis ([place]) 10/14/2018  . Pressure injury of skin 02/04/2018  . Acute myocardial infarction  ([place]) 02/03/2018  . Pre-syncope 11/28/2016  . Chronic combined systolic and diastolic heart failure ([place]) 11/19/2016  . ST elevation myocardial infarction (STEMI) of inferolateral wall ([place]) 11/19/2016  . 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 Left spastic hemiparesis ([place]) 07/15/2016  . Bursitis of right hip 04/01/2016  . Vascular headache   . 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 Asthma   . Benign essential HTN   . S/P rotator cuff repair   . AKI (acute kidney injury) ([place])   . Acute right MCA stroke ([place])   . Back pain   . Ischemic stroke ([place]) 03/10/2016  . Stroke (cerebrum) ([place]) 03/10/2016  . Hypothyroidism   . 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 UTI (urinary tract infection) 06/16/2015  . Family hx of colon cancer 04/17/2015  . OSA on CPAP 12/08/2014  . Retinopathy due to secondary diabetes mellitus, without macular edema, with moderate nonproliferative retinopathy ([place]) 09/18/2014  . CAD in native artery 01/23/2014  . Morbid obesity ([place]) 01/23/2014  . Renal insufficiency 11/17/2013  . HTN (hypertension) 10/29/2013  . Chronic diastolic heart failure ([place]) 10/29/2013  . 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 LADA (latent autoimmune diabetes in adults), managed as type 1 ([place]) 08/08/2013  Past Surgical History:  Procedure Laterality Date  . BREAST REDUCTION SURGERY  '88  . CARPAL TUNNEL RELEASE  2000   right wrist  . CERVICAL DISC SURGERY  '98   fusion  . CESAREAN SECTION  '79  '84  . COLONOSCOPY    . CORONARY BALLOON ANGIOPLASTY N/A 11/19/2016   Procedure: Coronary Balloon Angioplasty;  Surgeon: Sherren Mocha, MD;  Location: [facility];  Service: Cardiovascular;  Laterality: N/A;  . EP IMPLANTABLE DEVICE N/A 03/12/2016   Procedure: Loop Recorder Insertion;  Surgeon: Will Meredith Leeds, MD;  Location: [facility] LAB;  Service:  Cardiovascular;  Laterality: N/A;  . LEFT HEART CATH AND CORONARY ANGIOGRAPHY N/A 11/19/2016   Procedure: Left Heart Cath and Coronary Angiography;  Surgeon: Sherren Mocha, MD;  Location: [facility] LAB;  Service: Cardiovascular;  Laterality: N/A;  . LEFT HEART CATH AND CORONARY ANGIOGRAPHY N/A 02/03/2018   Procedure: LEFT HEART CATH AND CORONARY ANGIOGRAPHY;  Surgeon: Belva Crome, MD;  Location: [facility] LAB;  Service: Cardiovascular;  Laterality: N/A;  . LEFT HEART CATHETERIZATION WITH CORONARY ANGIOGRAM N/A 10/26/2013   Procedure: LEFT HEART CATHETERIZATION WITH CORONARY ANGIOGRAM;  Surgeon: Sinclair Grooms, MD;  Location: [facility] CATH LAB;  Service: Cardiovascular;  Laterality: N/A;  . ROTATOR CUFF REPAIR  '09   left shoulder  . TEE WITHOUT CARDIOVERSION N/A 03/12/2016   Procedure: TRANSESOPHAGEAL ECHOCARDIOGRAM (TEE);  Surgeon: Sanda Klein, MD;  Location: [facility] ENDOSCOPY;  Service: Cardiovascular;  Laterality: N/A;  . TUBAL LIGATION  '85  . ULNAR NERVE REPAIR  '02, '08   left done, then right     OB History   No obstetric history on file.      Home Medications    Prior to Admission medications   Medication Sig Start Date End Date Taking? Authorizing Provider  albuterol (PROVENTIL) (2.5 MG/3ML) 0.083% nebulizer solution Take 2.5 mg by nebulization every 6 (six) hours as needed for wheezing (or coughing).   Yes [provider]  aspirin EC 81 MG EC tablet Take 1 tablet (81 mg total) by mouth daily. 11/26/16  Yes Strader, [place] M, PA-C  budesonide-formoterol (SYMBICORT) 160-4.5 MCG/ACT inhaler Inhale 1 puff into the lungs 2 (two) times daily.    Yes [provider]  carvedilol (COREG) 6.25 MG tablet Take 6.25 mg by mouth 2 (two) times daily with a meal.   Yes [provider]  chlorhexidine (PERIDEX) 0.12 % solution 15 mLs by Mouth Rinse route See admin instructions. Rinse mouth with 15 ml's two times a day and encourage tooth brushing   Yes [provider]  clopidogrel (PLAVIX) 75 MG tablet Take 1 tablet (75 mg total) by mouth daily with breakfast. 02/06/18  Yes Bhagat, Bhavinkumar, PA  docusate sodium (COLACE) 100 MG capsule Take 100 mg by mouth 2 (two) times daily.   Yes [provider]  fenofibrate 160 MG tablet Take 160 mg by mouth at bedtime.    Yes [provider]  furosemide (LASIX) 40 MG tablet Take 40 mg by mouth 2 (two) times daily.    Yes [provider]  gabapentin (NEURONTIN) 300 MG capsule Take 300 mg by mouth 2 (two) times daily.    Yes [provider]  insulin glargine (LANTUS) 100 UNIT/ML injection Inject 0.7 mLs (70 Units total) into the skin 2 (two) times daily. 08/17/18  Yes Philemon Kingdom, MD  insulin regular (NOVOLIN R,HUMULIN R) 100 units/mL injection Inject 55-70 Units into the skin  every evening. Inject 70 units into the skin in the morning, 70 units at 11:30 AM, and 55 units in the evening   Yes [provider]  isosorbide mononitrate (IMDUR) 30 MG 24 hr tablet Take 30 mg by mouth daily.   Yes [provider]  levothyroxine (SYNTHROID, LEVOTHROID) 100 MCG tablet Take 1 tablet (100 mcg total) by mouth daily before breakfast. 04/01/16  Yes Love, Ivan Anchors, PA-C  metoCLOPramide (REGLAN) 10 MG tablet Take 10 mg by mouth 2 (two) times daily.    Yes [provider]  mometasone (NASONEX) 50 MCG/ACT nasal spray Place 2 sprays into the nose daily. 08/30/15  Yes Dohmeier, Asencion Partridge, MD  montelukast (SINGULAIR) 10 MG tablet Take 10 mg by mouth at bedtime.   Yes [provider]  Multiple Vitamin (MULTIVITAMIN WITH MINERALS) TABS tablet Take 1 tablet by mouth daily.    Yes [provider]  nitroGLYCERIN (NITROSTAT) 0.4 MG SL tablet Place 1 tablet (0.4 mg total) under the tongue every 5 (five) minutes as needed for chest pain. 10/29/13  Yes Barrett, Evelene Croon, PA-C  OXYGEN Inhale 2 L into the lungs See admin instructions. CPAP at bedtime as tolerated  with a bleed of 2 liters   Yes [provider]  pantoprazole (PROTONIX) 20 MG tablet Take 20 mg by mouth daily.   Yes [provider]  Phenylephrine-DM-GG (MUCINEX FAST-MAX CONGEST COUGH) 2.5-5-100 MG/5ML LIQD Take 15 mLs by mouth 3 (three) times daily.   Yes [provider]  polyethylene glycol (MIRALAX / GLYCOLAX) packet Take 17 g by mouth every other day.    Yes [provider]  rosuvastatin (CRESTOR) 20 MG tablet Take 20 mg by mouth at bedtime.    Yes [provider]  spironolactone (ALDACTONE) 25 MG tablet Take 25 mg by mouth daily.   Yes [provider]  talc (ZEASORB) powder Apply 1 application topically See admin instructions. Apply to buttocks topically 2 times a day- for healing, cleanse open area on left buttock with normal saline, apply Zesorb powder, leave open to air while in bed   Yes [provider]  topiramate (TOPAMAX) 50 MG tablet Take 50 mg by mouth 2 (two) times daily.   Yes [provider]  acetaminophen (TYLENOL) 325 MG tablet Take 650 mg by mouth every 6 (six) hours as needed for mild pain.     [provider]  albuterol (PROVENTIL HFA;VENTOLIN HFA) 108 (90 BASE) MCG/ACT inhaler Inhale 2 puffs into the lungs every 6 (six) hours as needed for wheezing or shortness of breath.     [provider]  cefpodoxime (VANTIN) 200 MG tablet Take 1 tablet (200 mg total) by mouth 2 (two) times daily. 10/16/18   Thurnell Lose, MD  guaiFENesin 200 MG tablet Take 600 mg by mouth 2 (two) times daily as needed for cough or to loosen phlegm.    [provider]  pantoprazole (PROTONIX) 40 MG tablet Take 1 tablet (40 mg total) by mouth daily. Patient not taking: Reported on 10/14/2018 04/01/16   Love, Ivan Anchors, PA-C  tiZANidine (ZANAFLEX) 2 MG tablet Take 2 mg by mouth every 8 (eight) hours as needed for muscle spasms.  10/26/15   [provider]    Family History Family History  Problem  Relation Age of Onset  . COPD Father   . Stroke Father   . Emphysema Father   . Heart failure Mother   . Heart attack Mother  several  . Diabetes Mother   . Kidney disease Mother   . Heart failure Brother   . Hypertension Brother   . Colon cancer Other 45       mat. 1st cousin  . Other Other        celiac sprue, 1/2 brother  . Diabetes Brother        x 3  . Lung cancer Brother   . Cirrhosis Sister        liver transplant  . Liver disease Sister        transplant  . Heart attack Paternal Grandfather   . Heart attack Brother   . Colon cancer Paternal Aunt 11  . Esophageal cancer Neg Hx   . Stomach cancer Neg Hx   . Rectal cancer Neg Hx     Social History Social History   Tobacco Use  . Smoking status: Never Smoker  . Smokeless tobacco: Never Used  Substance Use Topics  . Alcohol use: No  . Drug use: No     Allergies   Invokana [canagliflozin]; Nsaids; and Penicillins   Review of Systems Review of Systems  Ten systems reviewed and are negative for acute change, except as noted in the HPI.   Physical Exam Updated Vital Signs BP (!) 171/78 (BP Location: Left Arm)   Pulse 96   Temp 98.6 F (37 C) (Oral)   Resp 17   Ht 5\' 1"  (1.549 m)   Wt 89.8 kg   SpO2 97%   BMI 37.41 kg/m   Physical Exam Vitals signs and nursing note reviewed.  Constitutional:      General: She is not in acute distress.    Appearance: She is well-developed. She is not diaphoretic.  HENT:     Head: Normocephalic and atraumatic.  Eyes:     General: No scleral icterus.    Conjunctiva/sclera: Conjunctivae normal.  Neck:     Musculoskeletal: Normal range of motion.  Cardiovascular:     Rate and Rhythm: Normal rate and regular rhythm.     Heart sounds: Normal heart sounds. No murmur. No friction rub. No gallop.   Pulmonary:     Effort: Pulmonary effort is normal. No respiratory distress.     Breath sounds: Normal breath sounds.  Abdominal:     General: Bowel sounds are  normal. There is no distension.     Palpations: Abdomen is soft. There is no mass.     Tenderness: There is no abdominal tenderness. There is no guarding.  Skin:    General: Skin is warm and dry.  Neurological:     Mental Status: She is alert and oriented to person, place, and time.  Psychiatric:        Behavior: Behavior normal.      ED Treatments / Results  Labs (all labs ordered are listed, but only abnormal results are displayed) Labs Reviewed  URINE CULTURE - Abnormal; Notable for the following components:      Result Value   Culture >=100,000 COLONIES/mL PROTEUS MIRABILIS (*)    Organism ID, Bacteria PROTEUS MIRABILIS (*)    All other components within normal limits  CBC WITH DIFFERENTIAL/PLATELET - Abnormal; Notable for the following components:   WBC 15.7 (*)    Neutro Abs 14.0 (*)    All other components within normal limits  COMPREHENSIVE METABOLIC PANEL - Abnormal; Notable for the following components:   Potassium 3.3 (*)    CO2 18 (*)    BUN 24 (*)  Creatinine, Ser 1.86 (*)    Albumin 2.8 (*)    AST 48 (*)    Total Bilirubin 1.3 (*)    GFR calc non Af Amer 28 (*)    GFR calc Af Amer 32 (*)    Anion gap 19 (*)    All other components within normal limits  URINALYSIS, ROUTINE W REFLEX MICROSCOPIC - Abnormal; Notable for the following components:   Color, Urine AMBER (*)    APPearance CLOUDY (*)    Hgb urine dipstick SMALL (*)    Protein, ur 100 (*)    Leukocytes, UA LARGE (*)    All other components within normal limits  AMMONIA - Abnormal; Notable for the following components:   Ammonia 39 (*)    All other components within normal limits  LACTIC ACID, PLASMA - Abnormal; Notable for the following components:   Lactic Acid, Venous 2.1 (*)    All other components within normal limits  PROTIME-INR - Abnormal; Notable for the following components:   Prothrombin Time 16.3 (*)    All other components within normal limits  URINALYSIS, MICROSCOPIC (REFLEX) -  Abnormal; Notable for the following components:   Bacteria, UA MANY (*)    Non Squamous Epithelial PRESENT (*)    All other components within normal limits  BASIC METABOLIC PANEL - Abnormal; Notable for the following components:   CO2 18 (*)    Glucose, Bld 162 (*)    Creatinine, Ser 1.31 (*)    Calcium 8.6 (*)    GFR calc non Af Amer 42 (*)    GFR calc Af Amer 49 (*)    All other components within normal limits  CBC - Abnormal; Notable for the following components:   WBC 11.7 (*)    RBC 3.83 (*)    All other components within normal limits  GLUCOSE, CAPILLARY - Abnormal; Notable for the following components:   Glucose-Capillary 106 (*)    All other components within normal limits  GLUCOSE, CAPILLARY - Abnormal; Notable for the following components:   Glucose-Capillary 193 (*)    All other components within normal limits  GLUCOSE, CAPILLARY - Abnormal; Notable for the following components:   Glucose-Capillary 340 (*)    All other components within normal limits  GLUCOSE, CAPILLARY - Abnormal; Notable for the following components:   Glucose-Capillary 255 (*)    All other components within normal limits  GLUCOSE, CAPILLARY - Abnormal; Notable for the following components:   Glucose-Capillary 245 (*)    All other components within normal limits  GLUCOSE, CAPILLARY - Abnormal; Notable for the following components:   Glucose-Capillary 199 (*)    All other components within normal limits  POCT I-STAT EG7 - Abnormal; Notable for the following components:   pH, Ven 7.434 (*)    pCO2, Ven 32.8 (*)    Potassium 3.2 (*)    Calcium, Ion 1.13 (*)    All other components within normal limits  CULTURE, BLOOD (ROUTINE X 2)  CULTURE, BLOOD (ROUTINE X 2)  INFLUENZA PANEL BY PCR (TYPE A & B)  MAGNESIUM  CBG MONITORING, ED  I-STAT TROPONIN, ED  TYPE AND SCREEN  ABO/RH    EKG EKG Interpretation  Date/Time:  Thursday October 14 2018 14:28:53 EST Ventricular Rate:  88 PR Interval:      QRS Duration: 96 QT Interval:  462 QTC Calculation: 556 R Axis:   -77 Text Interpretation:  Sinus rhythm Consider right atrial enlargement Inferior infarct, old Anterolateral infarct, age indeterminate  Prolonged QT interval No significant change since last tracing Confirmed by Wandra Arthurs [phone]) on 10/14/2018 2:52:37 PM Also confirmed by Wandra Arthurs [phone]), editor Owens Shark, Deirdre [phone])  on 10/15/2018 7:43:34 AM   Radiology No results found.  Procedures Procedures (including critical care time)  Medications Ordered in ED Medications  lactated ringers bolus 1,000 mL (0 mLs Intravenous Stopped 10/14/18 1654)  acetaminophen (TYLENOL) suppository 650 mg (650 mg Rectal Given 10/14/18 1547)  ceFEPIme (MAXIPIME) 2 g in sodium chloride 0.9 % 100 mL IVPB ( Intravenous Stopped 10/14/18 1617)  vancomycin (VANCOCIN) 2,000 mg in sodium chloride 0.9 % 500 mL IVPB (0 mg Intravenous Stopped 10/14/18 1903)  potassium chloride 10 mEq in 100 mL IVPB (10 mEq Intravenous New Bag/Given 10/15/18 0217)     Initial Impression / Assessment and Plan / ED Course  I have reviewed the triage vital signs and the nursing notes.  Pertinent labs & imaging results that were available during my care of the patient were reviewed by me and considered in my medical decision making (see chart for details).  Clinical Course as of Oct 19 14  Thu Oct 14, 2018  1535 Temp(!): 102.7 F (39.3 C) [AH]  1535 Patient febrile, hypotensive, tachypneic.  Her white blood cell count is pending.  She has a fever of 102.7 and sepsis work-up/treatment has been initiated.  Patient's oxygen saturations are notably low.  BP(!): 105/57 [AH]  1536 Patient's EKG shows no ischemic changes.   [AH]    Clinical Course User Index [AH] Margarita Mail, PA-C    Patient family member at bedside.  Provides secondary history.  Family number states that she got sick in late 2019 around Christmas time and has essentially had a bad cough since that time.   She had tried antibiotics which initially improved her a little bit however she continued to have bad coughing spells and was started on a neb treatment which seemed to improve her.  Her feeling member states that at baseline she is normally very talkative and jokes a lot and has had normal mentation the last time she saw her which was on Sunday to 10/10/2018.   Patient will be admitted for sepsis. Altered mental status.  Final Clinical Impressions(s) / ED Diagnoses   Final diagnoses:  Pain  Ingrown toenail    ED Discharge Orders         Ordered    Increase activity slowly     10/16/18 0858    cefpodoxime (VANTIN) 200 MG tablet  2 times daily     02 /08/20 0858    Discharge instructions    Comments:  Follow with Primary MD [name], MD in 7 days   Get CBC, CMP, 2 view Chest X ray -  checked  by Primary MD or SNF MD in 5-7 days   Activity: As tolerated with Full fall precautions use walker/cane & assistance as needed  Disposition SNF  Diet: Heart Healthy Low Carb   Special Instructions: If you have smoked or chewed Tobacco  in the last 2 yrs please stop smoking, stop any regular Alcohol  and or any Recreational drug use.  On your next visit with your primary care physician please Get Medicines reviewed and adjusted.  Please request your Prim.MD to go over all Hospital Tests and Procedure/Radiological results at the follow up, please get all Hospital records sent to your Prim MD by signing hospital release before you go home.  If you experience worsening of your admission  symptoms, develop shortness of breath, life threatening emergency, suicidal or homicidal thoughts you must seek medical attention immediately by calling 911 or calling your MD immediately  if symptoms less severe.  You Must read complete instructions/literature along with all the possible adverse reactions/side effects for all the Medicines you take and that have been prescribed to you. Take any new Medicines  after you have completely understood and accpet all the possible adverse reactions/side effects.   10/16/18 [place], [place], PA-C 10/19/18 0019    Lennice Sites, DO 10/21/18 2209

## 2018-10-14 NOTE — Progress Notes (Signed)
Pharmacy Antibiotic Note  Danielle Vargas is a 68 y.o. female admitted on 10/14/2018 with sepsis.  Pharmacy has been consulted for vancomycin/cefepime dosing.  Concern for PNA/UTI. Presented from [facility] with AMS, fever, and reports of respiratory issues. Temp 102.7 in ED. WBC 15.7. Scr 1.82 (CrCl 33 mL/min). Has listed allergy to PCN - has tolerated cephalosporins in the past.  Plan: Vancomycin 2000 mg IV once Vancomycin 1250 mg IV every 48 hours.  Cefepime 2 g IV every 24 hours Monitor renal fx, clinical pic, cx results, and levels as appropriate  Height: 5\' 1"  (154.9 cm) Weight: 230 lb (104.3 kg) IBW/kg (Calculated) : 47.8  Temp (24hrs), Avg:101.4 F (38.6 C), Min:100.1 F (37.8 C), Max:102.7 F (39.3 C)  Recent Labs  Lab 10/14/18 1445  WBC PENDING    CrCl cannot be calculated (Patient's most recent lab result is older than the maximum 21 days allowed.).    Allergies  Allergen Reactions  . Invokana [Canagliflozin] Diarrhea  . Nsaids Other (See Comments)    GI problems/ stomach pain  . Penicillins Rash    Has patient had a PCN reaction causing immediate rash, facial/tongue/throat swelling, SOB or lightheadedness with hypotension: No (rash on legs after 3 days [place] -June 2016) Has patient had a PCN reaction causing severe rash involving mucus membranes or skin necrosis: No Has patient had a PCN reaction that required hospitalization No Has patient had a PCN reaction occurring within the last 10 years: Yes If all of the above answers are "NO", then may proceed with Cephalosporin use.    Antimicrobials this admission: Vancomycin 2/6 >>  Cefepime 2/6 >>   Dose adjustments this admission: N/A  Microbiology results: 2/6 BCx: sent 2/6 UCx: ordered  2/6 Influenza PCR: ordered  Thank you for allowing pharmacy to be a part of this patient's care.  Antonietta Jewel, PharmD, [place] Clinical Pharmacist  Pager: [phone] Phone: [phone] 10/14/2018 3:29 PM

## 2018-10-14 NOTE — ED Notes (Signed)
Called [facility] to inquire about bed status as bed (floor [phone]) was assigned 59 minutes ago; per charge nurse the rooms are not clean but will notify us when they are.

## 2018-10-14 NOTE — ED Notes (Signed)
Admitting MD at bedside.

## 2018-10-14 NOTE — ED Triage Notes (Signed)
Per [facility] pt coming from [place] for altered mental status, fever and respiratory issues for a couple days. EMS administered [facility]CC NS PTA. Patient arousable on calling. Denies any pain. Not following commands.

## 2018-10-14 NOTE — ED Provider Notes (Signed)
Medical screening examination/treatment/procedure(s) were conducted as a shared visit with non-physician practitioner(s) and myself.  I personally evaluated the patient during the encounter. Briefly, the patient is a 68 y.o. female with history of diabetes, hypertension, CAD, stroke who presents to the ED from nursing care facility with altered mental status.  Patient with fever of 102.7 upon arrival, patient is tachypneic, on room air she is around 90%.  Has some diminished breath sounds, cough.  She opens her eyes spontaneously, she does withdraw to pain on exam, does speak intermittently but overall is unable to participate in history.  Patient has no signs of volume overload.  She does have some skin breakdown in the sacral area but no deep ulcers.  Suspect pneumonia versus urinary tract infection.  Will get sepsis work-up and code sepsis was initiated.  Blood pressure normal for now.  Given a liter bolus.  We will empirically give IV antibiotics.  Will get CT scan as well.  Blood sugar normal upon arrival.  Patient with leukocytosis, lactic acidosis, urinalysis consistent with urinary tract infection.  Chest x-ray showed no signs of pneumonia, no pneumothorax, no pleural effusion.  Patient with improving mental status following IV fluids and Tylenol.  Family is at the bedside and states that patient appears to be improving as well.  She is able to talk more now and follow commands.  Patient has left-sided weakness at baseline from prior stroke.  Does not appear to have any new neurological deficits on exam.  Patient with mental status change likely from sepsis.  Improving with IV antibiotics and IV fluids.  No significant electrolyte abnormality, kidney injury.  Patient to be admitted to hospitalist service for further sepsis care.  Hemodynamically stable throughout my care.  This chart was dictated using voice recognition software.  Despite best efforts to proofread,  errors can occur which can change the  documentation meaning.   .Critical Care Performed by: Lennice Sites, DO Authorized by: Lennice Sites, DO   Critical care provider statement:    Critical care time (minutes):  40   Critical care time was exclusive of:  Separately billable procedures and treating other patients and teaching time   Critical care was necessary to treat or prevent imminent or life-threatening deterioration of the following conditions:  Sepsis   Critical care was time spent personally by me on the following activities:  Development of treatment plan with patient or surrogate, discussions with primary provider, evaluation of patient's response to treatment, examination of patient, obtaining history from patient or surrogate, ordering and performing treatments and interventions, ordering and review of laboratory studies, ordering and review of radiographic studies, pulse oximetry, re-evaluation of patient's condition and review of old charts   I assumed direction of critical care for this patient from another provider in my specialty: no        EKG Interpretation  Date/Time:  Thursday October 14 2018 14:28:53 EST Ventricular Rate:  88 PR Interval:    QRS Duration: 96 QT Interval:  462 QTC Calculation: 556 R Axis:   -77 Text Interpretation:  Sinus rhythm Consider right atrial enlargement Inferior infarct, old Anterolateral infarct, age indeterminate Prolonged QT interval No significant change since last tracing Confirmed by Wandra Arthurs [phone]) on 10/14/2018 2:52:37 PM           Lennice Sites, DO 10/14/18 1648

## 2018-10-14 NOTE — ED Notes (Signed)
Spoke with Network engineer and advised that we have a hospital bed here in the ED (as this was the reason that patient could not go to the floor), so we will place pt on hospital bed and transport her to floor.

## 2018-10-14 NOTE — H&P (Signed)
[facility] History and Physical  Danielle Vargas SWF:[id number] DOB: [birth year] DOA: 10/14/2018  PCP: [name], MD  Patient coming from: [facility]   Chief Complaint: Altered mental status  HPI: Danielle Vargas is a 67 y.o. female with a medical history of diabetes mellitus, hypertension, obstructive sleep apnea, coronary artery disease, who presented to the emergency department with complaints of altered mental status.  Patient resides at [facility].  Patient was supposedly normal this morning however around lunchtime she was found to be altered.  Daughter gave history and physical.  Patient was recently sick in December and was treated for pneumonia however has had a lingering cough and also received antibiotics approximately 2 weeks ago.  Patient had diarrhea along with those antibiotics which subsided.  She has not had any recent complaints of chest pain, shortness of breath, abdominal pain, nausea or vomiting, diarrhea or constipation, complaints dysuria, dizziness or headache.  She has not had any recent travel.    ED Course: Found to have sepsis.  Placed on IV fluids as well as IV antibiotics.  Chest x-ray unremarkable for infection.  UA positive for urinary tract infection.  TRH called for admission.  Review of Systems:  All other systems reviewed and are negative.   Past Medical History:  Diagnosis Date  . Acute combined systolic and diastolic heart failure ([place]) 11/19/2016  . Allergy   . Asthma   . CAD (coronary artery disease)    a. s/p lateral STEMI in 2015 2ry to occlusion of OM1 thought to be caused by coronary artery dissection, treated with POBA. b. 11/2016: STEMI with thrombotic occlusion of distal LCx which was treated with balloon angioplasty.   . Cataract    "just beginning"  . Colon polyps 7/07   hyperplastic and adenomatous  . Depression    pt unsure, psychologist said no  . Diverticulosis   . DM (diabetes mellitus) ([place])   . Dyslipidemia   .  External hemorrhoid   . Fatty liver   . Gastroparesis   . HTN (hypertension)   . Hyperlipidemia   . 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 Hypothyroidism   . Migraine   . Myocardial infarction ([place])    FEb 2015  . Nephrolithiasis   . Obesity   . Osteoporosis   . Sleep apnea    wears CPAP  . ST elevation myocardial infarction (STEMI) of inferolateral wall ([place]) 11/19/2016  . Stroke [facility])     Past Surgical History:  Procedure Laterality Date  . BREAST REDUCTION SURGERY  '88  . CARPAL TUNNEL RELEASE  2000   right wrist  . CERVICAL DISC SURGERY  '98   fusion  . CESAREAN SECTION  '79  '84  . COLONOSCOPY    . CORONARY BALLOON ANGIOPLASTY N/A 11/19/2016   Procedure: Coronary Balloon Angioplasty;  Surgeon: Sherren Mocha, MD;  Location: [place] CV LAB;  Service: Cardiovascular;  Laterality: N/A;  . EP IMPLANTABLE DEVICE N/A 03/12/2016   Procedure: Loop Recorder Insertion;  Surgeon: Will Meredith Leeds, MD;  Location: [place] CV LAB;  Service: Cardiovascular;  Laterality: N/A;  . LEFT HEART CATH AND CORONARY ANGIOGRAPHY N/A 11/19/2016   Procedure: Left Heart Cath and Coronary Angiography;  Surgeon: Sherren Mocha, MD;  Location: [place] CV LAB;  Service: Cardiovascular;  Laterality: N/A;  . LEFT HEART CATH AND CORONARY ANGIOGRAPHY N/A 02/03/2018   Procedure: LEFT HEART CATH AND CORONARY ANGIOGRAPHY;  Surgeon: Belva Crome, MD;  Location: [place] CV LAB;  Service:  Cardiovascular;  Laterality: N/A;  . LEFT HEART CATHETERIZATION WITH CORONARY ANGIOGRAM N/A 10/26/2013   Procedure: LEFT HEART CATHETERIZATION WITH CORONARY ANGIOGRAM;  Surgeon: Sinclair Grooms, MD;  Location[facility] C[facility] LAB;  Service: Cardiovascular;  Laterality: N/A;  . ROTATOR CUFF REPAIR  '09   left shoulder  . TEE WITHOUT CARDIOVERSION N/A 03/12/2016   Procedure: TRANSESOPHAGEAL ECHOCARDIOGRAM (TEE);  Surgeon: Sanda Klein, MD;  Location: [facility] ENDOSCOPY;  Service: Cardiovascular;  Laterality: N/A;  . TUBAL  LIGATION  '85  . ULNAR NERVE REPAIR  '02, '08   left done, then right    Social History:  reports that she has never smoked. She has never used smokeless tobacco. She reports that she does not drink alcohol or use drugs.  Allergies  Allergen Reactions  . Invokana [Canagliflozin] Diarrhea  . Nsaids Nausea Only and Other (See Comments)    GI problems/ stomach pain  . Penicillins Rash    Has patient had a PCN reaction causing immediate rash, facial/tongue/throat swelling, SOB or lightheadedness with hypotension: Yes (rash on legs after 3 days use -June 2016) Has patient had a PCN reaction causing severe rash involving mucus membranes or skin necrosis: No Has patient had a PCN reaction that required hospitalization: No Has patient had a PCN reaction occurring within the last 10 years: Yes If all of the above answers are "NO", then may proceed with Cephalosporin use.    Family History  Problem Relation Age of Onset  . COPD Father   . Stroke Father   . Emphysema Father   . Heart failure Mother   . Heart attack Mother        several  . Diabetes Mother   . Kidney disease Mother   . Heart failure Brother   . Hypertension Brother   . Colon cancer Other 45       mat. 1st cousin  . Other Other        celiac sprue, 1/2 brother  . Diabetes Brother        x 3  . Lung cancer Brother   . Cirrhosis Sister        liver transplant  . Liver disease Sister        transplant  . Heart attack Paternal Grandfather   . Heart attack Brother   . Colon cancer Paternal Aunt 2  . Esophageal cancer Neg Hx   . Stomach cancer Neg Hx   . Rectal cancer Neg Hx      Prior to Admission medications   Medication Sig Start Date End Date Taking? Authorizing Provider  albuterol (PROVENTIL) (2.5 MG/3ML) 0.083% nebulizer solution Take 2.5 mg by nebulization every 6 (six) hours as needed for wheezing (or coughing).   Yes [provider]  aspirin EC 81 MG EC tablet Take 1 tablet (81 mg total) by mouth  daily. 11/26/16  Yes Strader, [place] M, PA-C  budesonide-formoterol (SYMBICORT) 160-4.5 MCG/ACT inhaler Inhale 1 puff into the lungs 2 (two) times daily.    Yes [provider]  carvedilol (COREG) 6.25 MG tablet Take 6.25 mg by mouth 2 (two) times daily with a meal.   Yes [provider]  chlorhexidine (PERIDEX) 0.12 % solution 15 mLs by Mouth Rinse route See admin instructions. Rinse mouth with 15 ml's two times a day and encourage tooth brushing   Yes [provider]  clopidogrel (PLAVIX) 75 MG tablet Take 1 tablet (75 mg total) by mouth daily with breakfast. 02/06/18  Yes Bhagat, Bhavinkumar,  PA  docusate sodium (COLACE) 100 MG capsule Take 100 mg by mouth 2 (two) times daily.   Yes [provider]  fenofibrate 160 MG tablet Take 160 mg by mouth at bedtime.    Yes [provider]  furosemide (LASIX) 40 MG tablet Take 40 mg by mouth 2 (two) times daily.    Yes [provider]  gabapentin (NEURONTIN) 300 MG capsule Take 300 mg by mouth 2 (two) times daily.    Yes [provider]  insulin glargine (LANTUS) 100 UNIT/ML injection Inject 0.7 mLs (70 Units total) into the skin 2 (two) times daily. 08/17/18  Yes Philemon Kingdom, MD  insulin regular (NOVOLIN R,HUMULIN R) 100 units/mL injection Inject 55-70 Units into the skin every evening. Inject 70 units into the skin in the morning, 70 units at 11:30 AM, and 55 units in the evening   Yes [provider]  isosorbide mononitrate (IMDUR) 30 MG 24 hr tablet Take 30 mg by mouth daily.   Yes [provider]  levothyroxine (SYNTHROID, LEVOTHROID) 100 MCG tablet Take 1 tablet (100 mcg total) by mouth daily before breakfast. 04/01/16  Yes Love, Ivan Anchors, PA-C  metoCLOPramide (REGLAN) 10 MG tablet Take 10 mg by mouth 2 (two) times daily.    Yes [provider]  mometasone (NASONEX) 50 MCG/ACT nasal spray Place 2 sprays into the nose daily. 08/30/15  Yes Dohmeier, Asencion Partridge, MD    montelukast (SINGULAIR) 10 MG tablet Take 10 mg by mouth at bedtime.   Yes [provider]  Multiple Vitamin (MULTIVITAMIN WITH MINERALS) TABS tablet Take 1 tablet by mouth daily.    Yes [provider]  nitroGLYCERIN (NITROSTAT) 0.4 MG SL tablet Place 1 tablet (0.4 mg total) under the tongue every 5 (five) minutes as needed for chest pain. 10/29/13  Yes Barrett, Evelene Croon, PA-C  OXYGEN Inhale 2 L into the lungs See admin instructions. CPAP at bedtime as tolerated with a bleed of 2 liters   Yes [provider]  pantoprazole (PROTONIX) 20 MG tablet Take 20 mg by mouth daily.   Yes [provider]  Phenylephrine-DM-GG (MUCINEX FAST-MAX CONGEST COUGH) 2.5-5-100 MG/5ML LIQD Take 15 mLs by mouth 3 (three) times daily.   Yes [provider]  polyethylene glycol (MIRALAX / GLYCOLAX) packet Take 17 g by mouth every other day.    Yes [provider]  rosuvastatin (CRESTOR) 20 MG tablet Take 20 mg by mouth at bedtime.    Yes [provider]  spironolactone (ALDACTONE) 25 MG tablet Take 25 mg by mouth daily.   Yes [provider]  talc (ZEASORB) powder Apply 1 application topically See admin instructions. Apply to buttocks topically 2 times a day- for healing, cleanse open area on left buttock with normal saline, apply Zesorb powder, leave open to air while in bed   Yes [provider]  topiramate (TOPAMAX) 50 MG tablet Take 50 mg by mouth 2 (two) times daily.   Yes [provider]  acetaminophen (TYLENOL) 325 MG tablet Take 650 mg by mouth every 6 (six) hours as needed for mild pain.     [provider]  albuterol (PROVENTIL HFA;VENTOLIN HFA) 108 (90 BASE) MCG/ACT inhaler Inhale 2 puffs into the lungs every 6 (six) hours as needed for wheezing or shortness of breath.     [provider]  guaiFENesin 200 MG tablet Take 600 mg by mouth 2 (two) times daily as needed for cough or to loosen phlegm.  [provider]  pantoprazole (PROTONIX) 40 MG tablet Take 1 tablet (40 mg total) by mouth daily. Patient not taking: Reported on 10/14/2018 04/01/16   Love, Ivan Anchors, PA-C  tiZANidine (ZANAFLEX) 2 MG tablet Take 2 mg by mouth every 8 (eight) hours as needed for muscle spasms.  10/26/15   [provider]    Physical Exam: Vitals:   10/14/18 1630 10/14/18 1700  BP: (!) 136/52 (!) 102/55  Pulse: 97 91  Resp: 20 (!) 22  Temp:    SpO2: 97% 95%     General: Well developed, chronically ill appearing, NAD  HEENT: NCAT, PERRLA, EOMI, Anicteic Sclera, mucous membranes dry   Neck: Supple, no JVD, no masses  Cardiovascular: S1 S2 auscultated, no rubs, murmurs or gallops. Regular rate and rhythm.  Respiratory: Diminished and Coarse breath sounds  Abdomen: Soft, obese, nontender, nondistended, + bowel sounds  Extremities: warm dry without cyanosis clubbing or edema  Neuro: AAOx1 (self only), cranial nerves grossly intact. Chronically left sided weakness.   Skin: Without rashes exudates or nodules  Psych: pleasantly confused   Labs on Admission: I have personally reviewed following labs and imaging studies CBC: Recent Labs  Lab 10/14/18 1445 10/14/18 1554  WBC 15.7*  --   NEUTROABS 14.0*  --   HGB 13.8 13.6  HCT 42.5 40.0  MCV 97.7  --   PLT 284  --    Basic Metabolic Panel: Recent Labs  Lab 10/14/18 1445 10/14/18 1554  NA 142 142  K 3.3* 3.2*  CL 105  --   CO2 18*  --   GLUCOSE 93  --   BUN 24*  --   CREATININE 1.86*  --   CALCIUM 9.0  --    GFR: Estimated Creatinine Clearance: 33.1 mL/min (A) (by C-G formula based on SCr of 1.86 mg/dL (H)). Liver Function Tests: Recent Labs  Lab 10/14/18 1445  AST 48*  ALT 27  ALKPHOS 47  BILITOT 1.3*  PROT 7.5  ALBUMIN 2.8*   No results for input(s): LIPASE, AMYLASE in the last 168 hours. Recent Labs  Lab 10/14/18 1500  AMMONIA 39*   Coagulation Profile: Recent Labs  Lab 10/14/18 1445  INR 1.32    Cardiac Enzymes: No results for input(s): CKTOTAL, CKMB, CKMBINDEX, TROPONINI in the last 168 hours. BNP (last 3 results) No results for input(s): PROBNP in the last 8760 hours. HbA1C: No results for input(s): HGBA1C in the last 72 hours. CBG: Recent Labs  Lab 10/14/18 1510  GLUCAP 87   Lipid Profile: No results for input(s): CHOL, HDL, LDLCALC, TRIG, CHOLHDL, LDLDIRECT in the last 72 hours. Thyroid Function Tests: No results for input(s): TSH, T4TOTAL, FREET4, T3FREE, THYROIDAB in the last 72 hours. Anemia Panel: No results for input(s): VITAMINB12, FOLATE, FERRITIN, TIBC, IRON, RETICCTPCT in the last 72 hours. Urine analysis:    Component Value Date/Time   COLORURINE AMBER (A) 10/14/2018 1605   APPEARANCEUR CLOUDY (A) 10/14/2018 1605   LABSPEC 1.015 10/14/2018 1605   PHURINE 7.0 10/14/2018 1605   GLUCOSEU NEGATIVE 10/14/2018 1605   HGBUR SMALL (A) 10/14/2018 [place] 10/14/2018 1605   KETONESUR NEGATIVE 10/14/2018 1605   PROTEINUR 100 (A) 10/14/2018 1605   UROBILINOGEN 0.2 06/16/2015 1134   NITRITE NEGATIVE 10/14/2018 1605   LEUKOCYTESUR LARGE (A) 10/14/2018 1605   Sepsis Labs: @LABRCNTIP (procalcitonin:4,lacticidven:4) )No results found for this or any previous visit (from the past 240 hour(s)).   Radiological Exams on Admission: Ct Head Wo Contrast  Result Date: 10/14/2018 CLINICAL DATA:  68 year old female with a history of prior right MCA stroke presenting from [facility] home with altered mental status, fever and respiratory issues. EXAM: CT HEAD WITHOUT CONTRAST TECHNIQUE: Contiguous axial images were obtained from the base of the skull through the vertex without intravenous contrast. COMPARISON:  Prior head CT 03/11/2016 FINDINGS: Brain: No evidence of acute infarction, hemorrhage, hydrocephalus, extra-axial collection or mass lesion/mass effect. Sequelae of prior right MCA territory infarct with extensive encephalomalacia and mild ex vacuo  dilatation of the right lateral ventricle. Small bilateral remote inferior cerebellar hemispheric infarcts. Vascular: No hyperdense vessel or unexpected calcification. Skull: Normal. Negative for fracture or focal lesion. Sinuses/Orbits: No acute finding. Other: None. IMPRESSION: 1. No acute intracranial abnormality. 2. Stable sequelae of prior large right MCA territory and bilateral cerebellar hemispheric infarcts. Electronically Signed   By: Jacqulynn Cadet M.D.   On: 10/14/2018 17:19   Dg Chest Port 1 View  Result Date: 10/14/2018 CLINICAL DATA:  Altered mental status.  Pain. EXAM: PORTABLE CHEST 1 VIEW COMPARISON:  02/03/2018. FINDINGS: Mediastinum and hilar structures normal. Cardiac monitoring device noted over the chest. Cardiomegaly. No pulmonary venous congestion. No focal infiltrate. Small left pleural effusion. No pneumothorax. Prior cervical spine fusion. IMPRESSION: 1. Cardiomegaly. Stable prior exam. No pulmonary venous congestion. 2.  No acute pulmonary disease. Electronically Signed   By: Marcello Moores  Register   On: 10/14/2018 16:25    EKG: Independently reviewed. Sinus rhythm, rate 88. Prolonged QTc 556.   Assessment/Plan Severe sepsis secondary to urinary tract infection versus pneumonia -Patient presented with fever, tachypnea, leukocytosis, with altered mental status, acute kidney injury -UA: Amber color, cloudy appearance, many bacteria,>50 WBC, large leukocytes -Chest x-ray reviewed, showing cardiomegaly, unremarkable for infection -Of note, patient with recent URI and was on antibiotics 2 weeks ago -Blood and urine cultures pending -Patient given vancomycin and cefepime in the emergency department, will continue for now -Previous urine cultures grew Morganella in 2017, SCN in 2016  Acute kidney injury on chronic kidney disease, stage III -Likely secondary to sepsis -Creatinine on admission 1.86, baseline creatinine approximately 1.1-1.3 -Will place on IV fluids and continue  to monitor BMP closely  Acute metabolic encephalopathy -CT head pending -Suspect secondary to sepsis -Continue to treat underlying etiologies and monitor closely  Essential hypertension -Even sepsis, will hold home medications Coreg, Lasix, spironolactone  Diabetes mellitus, type II with neuropathy -Will continue Lantus at half dose along with in some sliding scale and CBG monitoring -Continue gabapentin for neuropathy  Hypothyroidism -Continue Synthroid  History of CVA -Left-sided weakness, continue Plavix, aspirin, statin  Hypokalemia -Replace and continue to monitor BMP  Obstructive sleep apnea/COPD -Will order CPAP with oxygen -Continue home medications  Coronary artery disease -No recent complaints of chest pain -EKG as above -Hold Coreg, Imdur -Continue Plavix, statin, aspirin  Chronic diastolic heart failure -Echocardiogram 02/04/2018 showed an EF of 60 to 10%, grade 1 diastolic dysfunction -Currently patient appears to be hypovolemic and dry -Blood pressure is on the softer side -Will hold Lasix, spironolactone -Monitor intake and output, daily weights  DVT prophylaxis: Lovenox  Code Status: Full  Family Communication: Daughter at bedside.  Admission, patients condition and plan of care including tests being ordered have been discussed with the patient and daughter who indicate understanding and agree with the plan and Code Status.  Disposition Plan: Nursing facility  Consults called: None  Admission status: Inpatient.  Patient require at least a 2 midnight stay in the hospital given her  sepsis and need for IV fluids as well as IV antibiotics.  Time spent: 70 minutes  Robbi Scurlock D.O. [facility]  Between 7am to 7pm - Please see pager noted on [email or web address]  After 7pm go to [email or web address] 10/14/2018, 5:27 PM

## 2018-10-15 LAB — CBC
HCT: 37.6 % (ref 36.0–46.0)
Hemoglobin: 12 g/dL (ref 12.0–15.0)
MCH: 31.3 pg (ref 26.0–34.0)
MCHC: 31.9 g/dL (ref 30.0–36.0)
MCV: 98.2 fL (ref 80.0–100.0)
Platelets: 219 10*3/uL (ref 150–400)
RBC: 3.83 MIL/uL — ABNORMAL LOW (ref 3.87–5.11)
RDW: 14.6 % (ref 11.5–15.5)
WBC: 11.7 10*3/uL — ABNORMAL HIGH (ref 4.0–10.5)
nRBC: 0 % (ref 0.0–0.2)

## 2018-10-15 LAB — BASIC METABOLIC PANEL
Anion gap: 14 (ref 5–15)
BUN: 19 mg/dL (ref 8–23)
CHLORIDE: 109 mmol/L (ref 98–111)
CO2: 18 mmol/L — ABNORMAL LOW (ref 22–32)
Calcium: 8.6 mg/dL — ABNORMAL LOW (ref 8.9–10.3)
Creatinine, Ser: 1.31 mg/dL — ABNORMAL HIGH (ref 0.44–1.00)
GFR calc Af Amer: 49 mL/min — ABNORMAL LOW (ref >60)
GFR calc non Af Amer: 42 mL/min — ABNORMAL LOW (ref >60)
Glucose, Bld: 162 mg/dL — ABNORMAL HIGH (ref 70–99)
POTASSIUM: 3.6 mmol/L (ref 3.5–5.1)
Sodium: 141 mmol/L (ref 135–145)

## 2018-10-15 LAB — GLUCOSE, CAPILLARY
Glucose-Capillary: 193 mg/dL — ABNORMAL HIGH (ref 70–99)
Glucose-Capillary: 340 mg/dL — ABNORMAL HIGH (ref 70–99)
Glucose-Capillary: 255 mg/dL — ABNORMAL HIGH (ref 70–99)
Glucose-Capillary: 245 mg/dL — ABNORMAL HIGH (ref 70–99)

## 2018-10-15 MED ORDER — ROSUVASTATIN CALCIUM 20 MG PO TABS
20.0000 mg | ORAL_TABLET | Freq: Every day | ORAL | Status: DC
  Administered 2018-10-15: 20 mg via ORAL
  Filled 2018-10-15: qty 1

## 2018-10-15 MED ORDER — CARVEDILOL 3.125 MG PO TABS
3.1250 mg | ORAL_TABLET | Freq: Two times a day (BID) | ORAL | Status: DC
  Administered 2018-10-15 – 2018-10-16 (×2): 3.125 mg via ORAL
  Filled 2018-10-15 (×2): qty 1

## 2018-10-15 NOTE — Progress Notes (Signed)
Patient refused CPAP tonight. There Is a machine in the room at this time. Explained to Patient that if they changed their mind, to just have the RN call Respiratory and we would come set them up.

## 2018-10-15 NOTE — Progress Notes (Signed)
CSW received verification that [facility] is able to take back the patient on Saturday. Patient will be reporting to room 310B. Please send the discharge summary by 11:00am so that the facility is able to get the medications from the pharmacy in time.   CSW will continue to follow.   Domenic Schwab, MSW, [place]

## 2018-10-15 NOTE — Consult Note (Signed)
            [facility] CM Primary Care Navigator  10/15/2018  Danielle Vargas Jul 21, 1951 [id number]   Attempt to see patient at the bedside to identify possible discharge needsbutInpatient social worker note states that verification was received that [facility] is able to take patient back on Saturday. Patient is a long term care resident at [facility].  Per MD note, patient presented to the emergency department from a local SNF-skilled nursing facility with complaints of altered mental status and work-up suggested toxic encephalopathy due to UTI which resulted to her admission.  No further [facility] care management needs identified as this point.   For additional questions please contact:  Edwena Felty A. Kodi Guerrera, BSN, RN-BC [facility] PRIMARY CARE Navigator Cell: [phone]

## 2018-10-15 NOTE — Progress Notes (Signed)
PROGRESS NOTE                                                                                                                                                                                                             Patient Demographics:    Danielle Vargas, is a 68 y.o. female, DOB - 01-21-1951, CBJ:[id number]  Admit date - 10/14/2018   Admitting Physician Cristal Ford, DO  Outpatient Primary MD for the patient is [name], MD  LOS - 1  Chief Complaint  Patient presents with  . Altered Mental Status       Brief Narrative   Danielle Vargas is a 68 y.o. female with a medical history of diabetes mellitus, hypertension, obstructive sleep apnea, coronary artery disease, who presented to the emergency department from a local SNF with complaints of altered mental status, other work-up suggested toxic encephalopathy due to UTI and she was admitted.   Subjective:    Danielle Vargas today has, No headache, No chest pain, No abdominal pain - No Nausea, No new weakness tingling or numbness, No Cough - SOB.     Assessment  & Plan :     1.  Toxic encephalopathy due to sepsis caused by UTI.  Sepsis pathophysiology has resolved, she has been hydrated well, continue IV cefepime, stop vancomycin, follow cultures.  Advance activity if stable discharge back to SNF tomorrow.  Patient is close to baseline.  2.  ARF.  Due to sepsis.  Baseline creatinine is around 1.2, hydrated and now almost completely resolved.  3.  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  Supportive care.  PT eval, continue dual antiplatelet therapy and home dose statin for secondary prevention.  4.  GERD.  On PPI.  5.  Obesity with OSA.  Follow with PCP for weight loss, CPAP at night scheduled.  6. Hypothyroidism.  On Synthroid continue.  7.  Diastolic CHF with EF of 16% on recent echocardiogram done in March 2019.  Currently compensated.  Diuretics on hold due to recent ARF.  8.   History of underlying CAD.  No acute issues.  Continue dual antiplatelet therapy along with statin for secondary prevention.  Blood pressures have improved we Vargas add Coreg and monitor.  10.  DM type II.  Lantus plus sliding scale monitor.  CBG (last 3)  Recent Labs    10/14/18 1510 10/14/18 2159 10/15/18 0800  GLUCAP 87 106* 193*     Family Communication  :  daughter  Code Status :  Full  Disposition Plan  :  SNF in am  Consults  :  None  Procedures  :    CT head.  Nonacute  DVT Prophylaxis  :  Lovenox   Lab Results  Component Value Date   PLT 219 10/15/2018    Diet :  Diet Order            Diet Carb Modified Fluid consistency: Thin; Room service appropriate? Yes  Diet effective now               Inpatient Medications Scheduled Meds: . aspirin EC  81 mg Oral Daily  . chlorhexidine  15 mL Mouth Rinse BID  . clopidogrel  75 mg Oral Q breakfast  . docusate sodium  100 mg Oral BID  . enoxaparin (LOVENOX) injection  40 mg Subcutaneous Q24H  . fluticasone  1 spray Each Nare Daily  . gabapentin  300 mg Oral BID  . insulin aspart  0-15 Units Subcutaneous TID WC  . insulin glargine  30 Units Subcutaneous BID  . levothyroxine  100 mcg Oral QAC breakfast  . metoCLOPramide  10 mg Oral BID  . mometasone-formoterol  2 puff Inhalation BID  . montelukast  10 mg Oral QHS  . multivitamin with minerals  1 tablet Oral Daily  . mupirocin cream   Topical BID  . nystatin   Topical BID  . pantoprazole  20 mg Oral Daily  . polyethylene glycol  17 g Oral QODAY  . topiramate  50 mg Oral BID   Continuous Infusions: . sodium chloride 75 mL/hr at 10/15/18 1027  . ceFEPime (MAXIPIME) IV     PRN Meds:.acetaminophen **OR** acetaminophen, albuterol, guaiFENesin-dextromethorphan, ondansetron **OR** ondansetron (ZOFRAN) IV  Antibiotics  :   Anti-infectives (From admission, onward)   Start     Dose/Rate Route Frequency Ordered Stop   10/16/18 1600  vancomycin (VANCOCIN) 1,250  mg in sodium chloride 0.9 % 250 mL IVPB  Status:  Discontinued     1,250 mg 166.7 mL/hr over 90 Minutes Intravenous Every 48 hours 10/14/18 1611 10/15/18 1051   10/15/18 1600  ceFEPIme (MAXIPIME) 2 g in sodium chloride 0.9 % 100 mL IVPB     2 g 200 mL/hr over 30 Minutes Intravenous Every 24 hours 10/14/18 1611     10/14/18 1800  vancomycin (VANCOCIN) IVPB 1000 mg/200 mL premix  Status:  Discontinued     1,000 mg 200 mL/hr over 60 Minutes Intravenous  Once 10/14/18 1749 10/14/18 1850   10/14/18 1800  ceFEPIme (MAXIPIME) 2 g in sodium chloride 0.9 % 100 mL IVPB  Status:  Discontinued     2 g 200 mL/hr over 30 Minutes Intravenous  Once 10/14/18 1749 10/14/18 1847   10/14/18 1800  ceFEPIme (MAXIPIME) 2 g in sodium chloride 0.9 % 100 mL IVPB  Status:  Discontinued     2 g 200 mL/hr over 30 Minutes Intravenous  Once 10/14/18 1749 10/14/18 1847   10/14/18 1530  ceFEPIme (MAXIPIME) 2 g in sodium chloride 0.9 % 100 mL IVPB     2 g 200 mL/hr over 30 Minutes Intravenous  Once 10/14/18 1527 10/14/18 1617   10/14/18 1530  vancomycin (VANCOCIN) 2,000 mg in sodium chloride 0.9 % 500 mL IVPB     2,000 mg 250 mL/hr over 120 Minutes Intravenous  Once 10/14/18 1527 10/14/18 1903          Objective:   Vitals:   10/14/18 1930 10/14/18 2056 10/14/18 2330  10/15/18 0506  BP: (!) 100/53 (!) 131/59  (!) 156/60  Pulse: 82 86 85 89  Resp: (!) 21   18  Temp:  99.7 F (37.6 C)  100.3 F (37.9 C)  TempSrc:  Oral  Oral  SpO2: 97% 95% 96% 94%  Weight:      Height:        Wt Readings from Last 3 Encounters:  10/14/18 104.3 kg  03/29/18 94.3 kg  03/25/18 91.2 kg     Intake/Output Summary (Last 24 hours) at 10/15/2018 1051 Last data filed at 10/15/2018 0513 Gross per 24 hour  Intake 2133.7 ml  Output -  Net 2133.7 ml     Physical Exam  Awake Alert, Oriented X 3, No new F.N deficits, dense left-sided hemiparesis which is her baseline, normal affect [place].AT,PERRAL Supple Neck,No JVD, No cervical  lymphadenopathy appriciated.  Symmetrical Chest wall movement, Good air movement bilaterally, CTAB RRR,No Gallops,Rubs or new Murmurs, No Parasternal Heave +ve B.Sounds, Abd Soft, No tenderness, No organomegaly appriciated, No rebound - guarding or rigidity. No Cyanosis, Clubbing or edema, No new Rash or bruise       Data Review:    CBC Recent Labs  Lab 10/14/18 1445 10/14/18 1554 10/15/18 0542  WBC 15.7*  --  11.7*  HGB 13.8 13.6 12.0  HCT 42.5 40.0 37.6  PLT 284  --  219  MCV 97.7  --  98.2  MCH 31.7  --  31.3  MCHC 32.5  --  31.9  RDW 14.3  --  14.6  LYMPHSABS 1.1  --   --   MONOABS 0.6  --   --   EOSABS 0.0  --   --   BASOSABS 0.0  --   --     Chemistries  Recent Labs  Lab 10/14/18 1445 10/14/18 1500 10/14/18 1554 10/15/18 0542  NA 142  --  142 141  K 3.3*  --  3.2* 3.6  CL 105  --   --  109  CO2 18*  --   --  18*  GLUCOSE 93  --   --  162*  BUN 24*  --   --  19  CREATININE 1.86*  --   --  1.31*  CALCIUM 9.0  --   --  8.6*  MG  --  2.0  --   --   AST 48*  --   --   --   ALT 27  --   --   --   ALKPHOS 47  --   --   --   BILITOT 1.3*  --   --   --    ------------------------------------------------------------------------------------------------------------------ No results for input(s): CHOL, HDL, LDLCALC, TRIG, CHOLHDL, LDLDIRECT in the last 72 hours.  Lab Results  Component Value Date   HGBA1C 8.9 (A) 08/17/2018   ------------------------------------------------------------------------------------------------------------------ No results for input(s): TSH, T4TOTAL, T3FREE, THYROIDAB in the last 72 hours.  Invalid input(s): FREET3 ------------------------------------------------------------------------------------------------------------------ No results for input(s): VITAMINB12, FOLATE, FERRITIN, TIBC, IRON, RETICCTPCT in the last 72 hours.  Coagulation profile Recent Labs  Lab 10/14/18 1445  INR 1.32    No results for input(s): DDIMER in  the last 72 hours.  Cardiac Enzymes No results for input(s): CKMB, TROPONINI, MYOGLOBIN in the last 168 hours.  Invalid input(s): CK ------------------------------------------------------------------------------------------------------------------    Component Value Date/Time   BNP 60.8 02/03/2018 0050    Micro Results Recent Results (from the past 240 hour(s))  Blood culture (routine x 2)  Status: None (Preliminary result)   Collection Time: 10/14/18  3:00 PM  Result Value Ref Range Status   Specimen Description BLOOD RIGHT WRIST  Final   Special Requests   Final    BOTTLES DRAWN AEROBIC AND ANAEROBIC Blood Culture results may not be optimal due to an excessive volume of blood received in culture bottles   Culture   Final    NO GROWTH < 24 HOURS Performed at [facility] [address], [place], [place] [address]    Report Status PENDING  Incomplete  Blood culture (routine x 2)     Status: None (Preliminary result)   Collection Time: 10/14/18  3:43 PM  Result Value Ref Range Status   Specimen Description BLOOD LEFT HAND  Final   Special Requests   Final    BOTTLES DRAWN AEROBIC AND ANAEROBIC Blood Culture results may not be optimal due to an excessive volume of blood received in culture bottles   Culture   Final    NO GROWTH < 24 HOURS Performed at [facility], [place] [address] [place], [place] [address]    Report Status PENDING  Incomplete    Radiology Reports Ct Head Wo Contrast  Result Date: 10/14/2018 CLINICAL DATA:  68 year old female with a history of prior right MCA stroke presenting from [facility] with altered mental status, fever and respiratory issues. EXAM: CT HEAD WITHOUT CONTRAST TECHNIQUE: Contiguous axial images were obtained from the base of the skull through the vertex without intravenous contrast. COMPARISON:  Prior head CT 03/11/2016 FINDINGS: Brain: No evidence of acute infarction, hemorrhage, hydrocephalus, extra-axial collection  or mass lesion/mass effect. Sequelae of prior right MCA territory infarct with extensive encephalomalacia and mild ex vacuo dilatation of the right lateral ventricle. Small bilateral remote inferior cerebellar hemispheric infarcts. Vascular: No hyperdense vessel or unexpected calcification. Skull: Normal. Negative for fracture or focal lesion. Sinuses/Orbits: No acute finding. Other: None. IMPRESSION: 1. No acute intracranial abnormality. 2. Stable sequelae of prior large right MCA territory and bilateral cerebellar hemispheric infarcts. Electronically Signed   By: Jacqulynn Cadet M.D.   On: 10/14/2018 17:19   Dg Chest Port 1 View  Result Date: 10/14/2018 CLINICAL DATA:  Altered mental status.  Pain. EXAM: PORTABLE CHEST 1 VIEW COMPARISON:  02/03/2018. FINDINGS: Mediastinum and hilar structures normal. Cardiac monitoring device noted over the chest. Cardiomegaly. No pulmonary venous congestion. No focal infiltrate. Small left pleural effusion. No pneumothorax. Prior cervical spine fusion. IMPRESSION: 1. Cardiomegaly. Stable prior exam. No pulmonary venous congestion. 2.  No acute pulmonary disease. Electronically Signed   By: Marcello Moores  Register   On: 10/14/2018 16:25    Time Spent in minutes  30   Lala Lund M.D on 10/15/2018 at 10:51 AM  To page go to [email or web address] - password [facility]

## 2018-10-15 NOTE — Progress Notes (Signed)
Physical Therapy Evaluation/Discharge Patient Details Name: Danielle Vargas MRN: [id number] DOB: 03-25-1951 Today's Date: 10/15/2018   History of Present Illness  Patient is 68 y/o female presenting to hospital with AMS due to toxic encelphalopathy secondary to UTI. PMH includes dCHF, DMI, CAD, OSA on CPAP, AKI, and hx of CVA with L sided deficits.   Clinical Impression  Patient admitted to hospital secondary to problems above and with deficits below. Patient required modA +2 to roll to L and total assist +2 to roll to R. Required hoyer lift to transfer from chair to bed. Noted cognitive deficits as patient only occasionally answering questions and had difficulty following one step commands. Patient currently at baseline level of functioning per RN. No further acute PT needs identified. All further needs can be deferred to SNF. All education has been completed and the patient has no further questions. PT is signing off. Thank you for this referral. If needs change, please re-consult.      Follow Up Recommendations SNF;Supervision/Assistance - 24 hour    Equipment Recommendations  None recommended by PT    Recommendations for Other Services       Precautions / Restrictions Precautions Precautions: Fall Restrictions Weight Bearing Restrictions: No      Mobility  Bed Mobility Overal bed mobility: Needs Assistance Bed Mobility: Rolling Rolling: Mod assist;Total assist;+2 for physical assistance         General bed mobility comments: Patient required heavy modA +2 to roll to L. Total assist to roll to R. Completed rolling x2 on each side for clean up following incontinence. Verbal cues to reach towards hand rail. Assistance needed for LE positioning.   Transfers Overall transfer level: Needs assistance               General transfer comment: Used hoyer lift to transfer patient from chair to bed   Ambulation/Gait                Stairs            Wheelchair  Mobility    Modified Rankin (Stroke Patients Only)       Balance                                             Pertinent Vitals/Pain Pain Assessment: Faces Faces Pain Scale: No hurt    Home Living Family/patient expects to be discharged to:: Skilled nursing facility                      Prior Function Level of Independence: Needs assistance   Gait / Transfers Assistance Needed: Per RN, patient uses a hoyer lift to transfer at baseline  ADL's / Homemaking Assistance Needed: Assistance likely provided by staff  Comments: Patient unable to answer PLOF questions     Hand Dominance        Extremity/Trunk Assessment   Upper Extremity Assessment Upper Extremity Assessment: Generalized weakness;LUE deficits/detail LUE Deficits / Details: LUE weakness at baseline secondary to CVA    Lower Extremity Assessment Lower Extremity Assessment: Generalized weakness;LLE deficits/detail;RLE deficits/detail RLE Deficits / Details: generalized weakness on RLE. Requried assistance for R knee flexion.  LLE Deficits / Details: LLE weakness at baseline secondary to CVA. Patient flaccid LLE. Passive ankle dorsiflexion to neurtral. Patient resting position in hip ER. Limited hip IR and flexion.    Cervical / Trunk  Assessment Cervical / Trunk Assessment: Normal  Communication   Communication: Expressive difficulties(Patient ocassionally answer questions )  Cognition Arousal/Alertness: Awake/alert Behavior During Therapy: Flat affect Overall Cognitive Status: No family/caregiver present to determine baseline cognitive functioning                                 General Comments: Patient occasionally responding to simple questions. However would not respond to prior level of function questions.  Some difficulty following one step commands      General Comments General comments (skin integrity, edema, etc.): RN notified of reddness on bottom. RN  reports baseline for patient.     Exercises     Assessment/Plan    PT Assessment All further PT needs can be met in the next venue of care  PT Problem List Decreased strength;Decreased range of motion;Decreased activity tolerance;Decreased balance;Decreased mobility;Decreased cognition;Obesity       PT Treatment Interventions      PT Goals (Current goals can be found in the Care Plan section)  Acute Rehab PT Goals PT Goal Formulation: Patient unable to participate in goal setting Time For Goal Achievement: 10/15/18 Potential to Achieve Goals: Fair    Frequency     Barriers to discharge        Co-evaluation               AM-PAC PT "6 Clicks" Mobility  Outcome Measure Help needed turning from your back to your side while in a flat bed without using bedrails?: Total Help needed moving from lying on your back to sitting on the side of a flat bed without using bedrails?: Total Help needed moving to and from a bed to a chair (including a wheelchair)?: Total Help needed standing up from a chair using your arms (e.g., wheelchair or bedside chair)?: Total Help needed to walk in hospital room?: Total Help needed climbing 3-5 steps with a railing? : Total 6 Click Score: 6    End of Session   Activity Tolerance: Patient tolerated treatment well Patient left: in bed;with call bell/phone within reach;with bed alarm set Nurse Communication: Mobility status;Other (comment)(reddness on bottom, incontinence) PT Visit Diagnosis: Other abnormalities of gait and mobility (R26.89);Muscle weakness (generalized) (M62.81);Hemiplegia and hemiparesis Hemiplegia - Right/Left: Left Hemiplegia - caused by: Cerebral infarction    Time: 1334-1354 PT Time Calculation (min) (ACUTE ONLY): 20 min   Charges:   PT Evaluation $PT Eval Moderate Complexity: 1 Mod           , SPT    10/15/2018, 2:31 PM   

## 2018-10-15 NOTE — Progress Notes (Signed)
Patient daughter at bedside and expressed concerns about patient mental status not improving, she was concerned the patient may have had another stroke.  On assessment the patient's neuro exam was the same as this morning. Vitals stable and CBG 255 at this time.  MD Candiss Norse made aware of concerns and explained that confusion may be worse in the evenings and my persist because of infection. Will continue to monitor.

## 2018-10-16 LAB — GLUCOSE, CAPILLARY: Glucose-Capillary: 199 mg/dL — ABNORMAL HIGH (ref 70–99)

## 2018-10-16 MED ORDER — CEFPODOXIME PROXETIL 200 MG PO TABS: 200.0000 mg | ORAL_TABLET | Freq: Two times a day (BID) | ORAL | 0 refills | Status: AC

## 2018-10-16 NOTE — NC FL2 (Signed)
[place] MEDICAID FL2 LEVEL OF CARE SCREENING TOOL     IDENTIFICATION  Patient Name: Danielle Vargas Birthdate: Dec 15, 1950 Sex: female Admission Date (Current Location): 10/14/2018  [facility] and [place] Number:  Herbalist and Address:  [facility], [place] [address] [place], [place] [address]      Provider Number: [id number]  Attending Physician Name and Address:  Thurnell Lose, MD  Relative Name and Phone Number:  Curt Bears (ENIDPOEU)[phone]    Current Level of Care: Hospital Recommended Level of Care: [place] Prior Approval Number:    Date Approved/Denied: 03/20/16 PASRR Number: [id number]  Discharge Plan: SNF    Current Diagnoses: Patient Active Problem List   Diagnosis Date Noted  . Sepsis ([place]) 10/14/2018  . Pressure injury of skin 02/04/2018  . Acute myocardial infarction ([place]) 02/03/2018  . Pre-syncope 11/28/2016  . Chronic combined systolic and diastolic heart failure ([place]) 11/19/2016  . ST elevation myocardial infarction (STEMI) of inferolateral wall ([place]) 11/19/2016  . 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 Left spastic hemiparesis ([place]) 07/15/2016  . Bursitis of right hip 04/01/2016  . Vascular headache   . Right middle cerebral artery stroke ([place]) 03/14/2016  . Asthma   . Benign essential HTN   . S/P rotator cuff repair   . AKI (acute kidney injury) ([place])   . Acute right MCA stroke ([place])   . Back pain   . Ischemic stroke ([place]) 03/10/2016  . Stroke (cerebrum) ([place]) 03/10/2016  . Hypothyroidism   . 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 UTI (urinary tract infection) 06/16/2015  . Family hx of colon cancer 04/17/2015  . OSA on CPAP 12/08/2014  . Retinopathy due to secondary diabetes mellitus, without macular edema, with moderate nonproliferative retinopathy ([place]) 09/18/2014  . CAD in  native artery 01/23/2014  . Morbid obesity ([place]) 01/23/2014  . Renal insufficiency 11/17/2013  . HTN (hypertension) 10/29/2013  . Chronic diastolic heart failure ([place]) 10/29/2013  . 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 LADA (latent autoimmune diabetes in adults), managed as type 1 ([place]) 08/08/2013    Orientation RESPIRATION BLADDER Height & Weight     Self, Time, Place, Situation  Normal Incontinent, External catheter Weight: 89.8 kg Height:  5\' 1"  (154.9 cm)  BEHAVIORAL SYMPTOMS/MOOD NEUROLOGICAL BOWEL NUTRITION STATUS      Continent Diet(see discharge summary)  AMBULATORY STATUS COMMUNICATION OF NEEDS Skin   Extensive Assist Verbally (rash on buttocks)                       Personal Care Assistance Level of Assistance  Bathing, Feeding, Dressing, Total care Bathing Assistance: Maximum assistance Feeding assistance: Independent Dressing Assistance: Maximum assistance Total Care Assistance: Maximum assistance   Functional Limitations Info  Sight, Hearing, Speech Sight Info: Adequate Hearing Info: Adequate Speech Info: Adequate    SPECIAL CARE FACTORS FREQUENCY  PT (By licensed PT), OT (By licensed OT)     PT Frequency: min 5x weekly OT Frequency: min 5x weekly            Contractures Contractures Info: Not present    Additional Factors Info  Code Status, Allergies Code Status Info: full Allergies Info: Invokana (canagliflozin), Nsaids, Penicillins           Current Medications (10/16/2018):  This is the current hospital active medication list Current Facility-Administered Medications  Medication Dose Route Frequency Provider Last Rate  Last Dose  . acetaminophen (TYLENOL) tablet 650 mg  650 mg Oral Q6H PRN Mikhail, Velta Addison, DO      . albuterol (PROVENTIL) (2.5 MG/3ML) 0.083% nebulizer solution 2.5 mg  2.5 mg Nebulization Q6H PRN Cristal Ford, DO      . aspirin EC tablet 81 mg  81 mg Oral Daily Cristal Ford, DO   81 mg at 10/15/18 1002  .  carvedilol (COREG) tablet 3.125 mg  3.125 mg Oral BID WC Thurnell Lose, MD   3.125 mg at 10/16/18 1610  . ceFEPIme (MAXIPIME) 2 g in sodium chloride 0.9 % 100 mL IVPB  2 g Intravenous Q24H [place], [place], DO   Stopped at 10/15/18 1620  . chlorhexidine (PERIDEX) 0.12 % solution 15 mL  15 mL Mouth Rinse BID Cristal Ford, DO   15 mL at 10/15/18 2228  . clopidogrel (PLAVIX) tablet 75 mg  75 mg Oral Q breakfast Cristal Ford, DO   75 mg at 10/16/18 9604  . docusate sodium (COLACE) capsule 100 mg  100 mg Oral BID Cristal Ford, DO   100 mg at 10/15/18 2229  . enoxaparin (LOVENOX) injection 40 mg  40 mg Subcutaneous Q24H Mikhail, [place], DO   40 mg at 10/15/18 1833  . fluticasone (FLONASE) 50 MCG/ACT nasal spray 1 spray  1 spray Each Nare Daily Mikhail, Velta Addison, DO   1 spray at 10/15/18 1002  . gabapentin (NEURONTIN) capsule 300 mg  300 mg Oral BID Cristal Ford, DO   300 mg at 10/15/18 2229  . guaiFENesin-dextromethorphan (ROBITUSSIN DM) 100-10 MG/5ML syrup 10 mL  10 mL Oral TID PRN Cristal Ford, DO      . insulin aspart (novoLOG) injection 0-15 Units  0-15 Units Subcutaneous TID [facility] Cristal Ford, DO   3 Units at 10/16/18 5409  . insulin glargine (LANTUS) injection 30 Units  30 Units Subcutaneous BID Cristal Ford, DO   30 Units at 10/15/18 2231  . levothyroxine (SYNTHROID, LEVOTHROID) tablet 100 mcg  100 mcg Oral QAC breakfast Cristal Ford, DO   100 mcg at 10/16/18 8119  . metoCLOPramide (REGLAN) tablet 10 mg  10 mg Oral BID Cristal Ford, DO   10 mg at 10/15/18 2229  . mometasone-formoterol (DULERA) 200-5 MCG/ACT inhaler 2 puff  2 puff Inhalation BID Cristal Ford, DO   2 puff at 10/16/18 [phone]  . montelukast (SINGULAIR) tablet 10 mg  10 mg Oral QHS Mikhail, [place], DO   10 mg at 10/15/18 2229  . multivitamin with minerals tablet 1 tablet  1 tablet Oral Daily Cristal Ford, DO   1 tablet at 10/15/18 1003  . mupirocin cream (BACTROBAN) 2 %   Topical BID Cristal Ford, DO      . nystatin (MYCOSTATIN/NYSTOP) topical powder   Topical BID Cristal Ford, DO      . ondansetron [facility]) injection 4 mg  4 mg Intravenous Q6H PRN Mikhail, Velta Addison, DO      . pantoprazole (PROTONIX) EC tablet 20 mg  20 mg Oral Daily Mikhail, Velta Addison, DO   20 mg at 10/15/18 1002  . polyethylene glycol (MIRALAX / GLYCOLAX) packet 17 g  17 g Oral QODAY Mikhail, [place], DO   Stopped at 10/14/18 1844  . rosuvastatin (CRESTOR) tablet 20 mg  20 mg Oral QHS Thurnell Lose, MD   20 mg at 10/15/18 2229  . topiramate (TOPAMAX) tablet 50 mg  50 mg Oral BID Cristal Ford, DO   50 mg at 10/15/18 2229     Discharge  Medications: Please see discharge summary for a list of discharge medications.  Relevant Imaging Results:  Relevant Lab Results:   Additional Information SSN: [id number]  Alberteen Sam, LCSW

## 2018-10-16 NOTE — Progress Notes (Signed)
Discharge AVS printed, added to packet with prescription. RN called report to Nolen Mu, nurse at [facility]. Awaiting transport to SNF. Will continue to monitor.

## 2018-10-16 NOTE — Progress Notes (Signed)
Patient will Discharge To: [facility] Anticipated DC Date:10/16/2018 Family Notified:yes, daughter Vira Agar [phone] Transport By: Corey Harold   Per MD patient ready for DC to [facility] . RN, patient, patient's family, and facility notified of DC. Assessment, Fl2/Pasrr, and Discharge Summary sent to facility. RN given number for report [phone], Room # 310B). DC packet on chart. Ambulance transport requested for patient.   CSW signing off.  Reed Breech LCSWA [phone]

## 2018-10-16 NOTE — Discharge Summary (Signed)
Danielle Vargas ZCH:[id number] DOB: June 06, 1951 DOA: 10/14/2018  PCP: [name], MD  Admit date: 10/14/2018  Discharge date: 10/16/2018  Admitted From:SNF   Disposition:  SNF   Recommendations for Outpatient Follow-up:   Follow up with PCP in 1-2 weeks  PCP Please obtain BMP/CBC, 2 view CXR in 1week,  (see Discharge instructions)   PCP Please follow up on the following pending results:    Home Health: None   Equipment/Devices: None  Consultations: None Discharge Condition: Stable   CODE STATUS: Full   Diet Recommendation: Heart Healthy Low Carb     Chief Complaint  Patient presents with  . Altered Mental Status     Brief history of present illness from the day of admission and additional interim summary    Danielle McKinnonis a 68 y.o.femalewith a medical history ofdiabetes mellitus, hypertension, obstructive sleep apnea, coronary artery disease, who presented to the emergency department from a local SNF with complaints of altered mental status, other work-up suggested toxic encephalopathy due to UTI and she was admitted.                                                                 Hospital Course   1.  Toxic encephalopathy due to sepsis caused by UTI.  Sepsis pathophysiology has resolved, she has been hydrated well, continue IV cefepime, urine culture and sensitivity data noted, placed on Vantin for 5 more days, discharge back to SNF she is close to her baseline.  2.  ARF.  Due to sepsis.  Baseline creatinine is around 1.2, hydrated and now almost completely resolved.  New home dose diuretic and ACE inhibitor, SNF MD to monitor BMP.  3.  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  Supportive care.  PT eval, continue dual antiplatelet therapy and home dose statin for secondary  prevention.  4.  GERD.  On PPI.  5.  Obesity with OSA.  Follow with PCP for weight loss, CPAP at night scheduled.  6. Hypothyroidism.  On Synthroid continue.  7.  Diastolic CHF with EF of 28% on recent echocardiogram done in March 2019.  Currently compensated.  Diuretics on hold due to recent ARF.  8.  History of underlying CAD.  No acute issues.  Continue dual antiplatelet therapy along with statin for secondary prevention.  Blood pressures have improved we will add Coreg and monitor.  10.  DM type II.    Schedule and regimen.  Monitor CBGs QA CHS.     Discharge diagnosis     Active Problems:   HTN (hypertension)   Chronic diastolic heart failure (HCC)   CAD in native artery   OSA on CPAP   UTI (urinary tract infection)   Hypothyroidism   AKI (acute kidney injury) ([place])   Sepsis ([place])  Discharge instructions    Discharge Instructions    Discharge instructions   Complete by:  As directed    Follow with Primary MD [name], MD in 7 days   Get CBC, CMP, 2 view Chest X ray -  checked  by Primary MD or SNF MD in 5-7 days   Activity: As tolerated with Full fall precautions use walker/cane & assistance as needed  Disposition SNF  Diet: Heart Healthy Low Carb   Special Instructions: If you have smoked or chewed Tobacco  in the last 2 yrs please stop smoking, stop any regular Alcohol  and or any Recreational drug use.  On your next visit with your primary care physician please Get Medicines reviewed and adjusted.  Please request your Prim.MD to go over all Hospital Tests and Procedure/Radiological results at the follow up, please get all Hospital records sent to your Prim MD by signing hospital release before you go home.  If you experience worsening of your admission symptoms, develop shortness of breath, life threatening emergency, suicidal or homicidal thoughts you must seek medical attention immediately by calling 911 or calling your MD immediately  if  symptoms less severe.  You Must read complete instructions/literature along with all the possible adverse reactions/side effects for all the Medicines you take and that have been prescribed to you. Take any new Medicines after you have completely understood and accpet all the possible adverse reactions/side effects.   Increase activity slowly   Complete by:  As directed       Discharge Medications   Allergies as of 10/16/2018      Reactions   Invokana [canagliflozin] Diarrhea   Nsaids Nausea Only, Other (See Comments)   GI problems/ stomach pain   Penicillins Rash   Has patient had a PCN reaction causing immediate rash, facial/tongue/throat swelling, SOB or lightheadedness with hypotension: Yes (rash on legs after 3 days use -June 2016) Has patient had a PCN reaction causing severe rash involving mucus membranes or skin necrosis: No Has patient had a PCN reaction that required hospitalization: No Has patient had a PCN reaction occurring within the last 10 years: Yes If all of the above answers are "NO", then may proceed with Cephalosporin use.      Medication List    TAKE these medications   acetaminophen 325 MG tablet Commonly known as:  TYLENOL Take 650 mg by mouth every 6 (six) hours as needed for mild pain.   albuterol (2.5 MG/3ML) 0.083% nebulizer solution Commonly known as:  PROVENTIL Take 2.5 mg by nebulization every 6 (six) hours as needed for wheezing (or coughing).   albuterol 108 (90 Base) MCG/ACT inhaler Commonly known as:  PROVENTIL HFA;VENTOLIN HFA Inhale 2 puffs into the lungs every 6 (six) hours as needed for wheezing or shortness of breath.   aspirin 81 MG EC tablet Take 1 tablet (81 mg total) by mouth daily.   budesonide-formoterol 160-4.5 MCG/ACT inhaler Commonly known as:  SYMBICORT Inhale 1 puff into the lungs 2 (two) times daily.   carvedilol 6.25 MG tablet Commonly known as:  COREG Take 6.25 mg by mouth 2 (two) times daily with a meal.     cefpodoxime 200 MG tablet Commonly known as:  VANTIN Take 1 tablet (200 mg total) by mouth 2 (two) times daily.   chlorhexidine 0.12 % solution Commonly known as:  PERIDEX 15 mLs by Mouth Rinse route See admin instructions. Rinse mouth with 15 ml's two times a day and encourage tooth brushing  clopidogrel 75 MG tablet Commonly known as:  PLAVIX Take 1 tablet (75 mg total) by mouth daily with breakfast.   CRESTOR 20 MG tablet Generic drug:  rosuvastatin Take 20 mg by mouth at bedtime.   docusate sodium 100 MG capsule Commonly known as:  COLACE Take 100 mg by mouth 2 (two) times daily.   fenofibrate 160 MG tablet Take 160 mg by mouth at bedtime.   furosemide 40 MG tablet Commonly known as:  LASIX Take 40 mg by mouth 2 (two) times daily.   gabapentin 300 MG capsule Commonly known as:  NEURONTIN Take 300 mg by mouth 2 (two) times daily.   guaiFENesin 200 MG tablet Take 600 mg by mouth 2 (two) times daily as needed for cough or to loosen phlegm.   insulin glargine 100 UNIT/ML injection Commonly known as:  LANTUS Inject 0.7 mLs (70 Units total) into the skin 2 (two) times daily.   insulin regular 100 units/mL injection Commonly known as:  NOVOLIN R,HUMULIN R Inject 55-70 Units into the skin every evening. Inject 70 units into the skin in the morning, 70 units at 11:30 AM, and 55 units in the evening   isosorbide mononitrate 30 MG 24 hr tablet Commonly known as:  IMDUR Take 30 mg by mouth daily.   levothyroxine 100 MCG tablet Commonly known as:  SYNTHROID, LEVOTHROID Take 1 tablet (100 mcg total) by mouth daily before breakfast.   metoCLOPramide 10 MG tablet Commonly known as:  REGLAN Take 10 mg by mouth 2 (two) times daily.   mometasone 50 MCG/ACT nasal spray Commonly known as:  NASONEX Place 2 sprays into the nose daily.   montelukast 10 MG tablet Commonly known as:  SINGULAIR Take 10 mg by mouth at bedtime.   MUCINEX FAST-MAX CONGEST COUGH 2.5-5-100 MG/5ML  Liqd Generic drug:  Phenylephrine-DM-GG Take 15 mLs by mouth 3 (three) times daily.   multivitamin with minerals Tabs tablet Take 1 tablet by mouth daily.   nitroGLYCERIN 0.4 MG SL tablet Commonly known as:  NITROSTAT Place 1 tablet (0.4 mg total) under the tongue every 5 (five) minutes as needed for chest pain.   OXYGEN Inhale 2 L into the lungs See admin instructions. CPAP at bedtime as tolerated with a bleed of 2 liters   pantoprazole 20 MG tablet Commonly known as:  PROTONIX Take 20 mg by mouth daily.   pantoprazole 40 MG tablet Commonly known as:  PROTONIX Take 1 tablet (40 mg total) by mouth daily.   polyethylene glycol packet Commonly known as:  MIRALAX / GLYCOLAX Take 17 g by mouth every other day.   spironolactone 25 MG tablet Commonly known as:  ALDACTONE Take 25 mg by mouth daily.   tiZANidine 2 MG tablet Commonly known as:  ZANAFLEX Take 2 mg by mouth every 8 (eight) hours as needed for muscle spasms.   topiramate 50 MG tablet Commonly known as:  TOPAMAX Take 50 mg by mouth 2 (two) times daily.   ZEASORB powder Generic drug:  talc Apply 1 application topically See admin instructions. Apply to buttocks topically 2 times a day- for healing, cleanse open area on left buttock with normal saline, apply Zesorb powder, leave open to air while in bed       Follow-up Information    [name], MD. Schedule an appointment as soon as possible for a visit in 1 week(s).   Specialty:  Internal Medicine Contact information: [address] [place] [address] [phone]  Belva Crome, MD .   Specialty:  Cardiology Contact information: [phone] [address] [place] [address] [place] [address] [phone]           Major procedures and Radiology Reports - PLEASE review detailed and final reports thoroughly  -         Ct Head Wo Contrast  Result Date: 10/14/2018 CLINICAL DATA:  68 year old female with a history of prior right MCA  stroke presenting from [facility] home with altered mental status, fever and respiratory issues. EXAM: CT HEAD WITHOUT CONTRAST TECHNIQUE: Contiguous axial images were obtained from the base of the skull through the vertex without intravenous contrast. COMPARISON:  Prior head CT 03/11/2016 FINDINGS: Brain: No evidence of acute infarction, hemorrhage, hydrocephalus, extra-axial collection or mass lesion/mass effect. Sequelae of prior right MCA territory infarct with extensive encephalomalacia and mild ex vacuo dilatation of the right lateral ventricle. Small bilateral remote inferior cerebellar hemispheric infarcts. Vascular: No hyperdense vessel or unexpected calcification. Skull: Normal. Negative for fracture or focal lesion. Sinuses/Orbits: No acute finding. Other: None. IMPRESSION: 1. No acute intracranial abnormality. 2. Stable sequelae of prior large right MCA territory and bilateral cerebellar hemispheric infarcts. Electronically Signed   By: Jacqulynn Cadet M.D.   On: 10/14/2018 17:19   Dg Chest Port 1 View  Result Date: 10/14/2018 CLINICAL DATA:  Altered mental status.  Pain. EXAM: PORTABLE CHEST 1 VIEW COMPARISON:  02/03/2018. FINDINGS: Mediastinum and hilar structures normal. Cardiac monitoring device noted over the chest. Cardiomegaly. No pulmonary venous congestion. No focal infiltrate. Small left pleural effusion. No pneumothorax. Prior cervical spine fusion. IMPRESSION: 1. Cardiomegaly. Stable prior exam. No pulmonary venous congestion. 2.  No acute pulmonary disease. Electronically Signed   By: Marcello Moores  Register   On: 10/14/2018 16:25    Micro Results     Recent Results (from the past 240 hour(s))  Blood culture (routine x 2)     Status: None (Preliminary result)   Collection Time: 10/14/18  3:00 PM  Result Value Ref Range Status   Specimen Description BLOOD RIGHT WRIST  Final   Special Requests   Final    BOTTLES DRAWN AEROBIC AND ANAEROBIC Blood Culture results may not be optimal  due to an excessive volume of blood received in culture bottles   Culture   Final    NO GROWTH < 24 HOURS Performed at [facility] [address], [place], [place] [address]    Report Status PENDING  Incomplete  Blood culture (routine x 2)     Status: None (Preliminary result)   Collection Time: 10/14/18  3:43 PM  Result Value Ref Range Status   Specimen Description BLOOD LEFT HAND  Final   Special Requests   Final    BOTTLES DRAWN AEROBIC AND ANAEROBIC Blood Culture results may not be optimal due to an excessive volume of blood received in culture bottles   Culture   Final    NO GROWTH < 24 HOURS Performed at [facility], [place] [address] [place], [place] [address]    Report Status PENDING  Incomplete  Culture, Urine     Status: Abnormal (Preliminary result)   Collection Time: 10/14/18  4:05 PM  Result Value Ref Range Status   Specimen Description URINE, CLEAN CATCH  Final   Special Requests NONE  Final   Culture >=100,000 COLONIES/mL PROTEUS MIRABILIS (A)  Final   Report Status PENDING  Incomplete   Organism ID, Bacteria PROTEUS MIRABILIS (A)  Final  Susceptibility   Proteus mirabilis - MIC*    AMPICILLIN <=2 SENSITIVE Sensitive     CEFAZOLIN <=4 SENSITIVE Sensitive     CEFTRIAXONE <=1 SENSITIVE Sensitive     CIPROFLOXACIN <=0.25 SENSITIVE Sensitive     GENTAMICIN <=1 SENSITIVE Sensitive     IMIPENEM 2 SENSITIVE Sensitive     NITROFURANTOIN 128 RESISTANT Resistant     TRIMETH/SULFA <=20 SENSITIVE Sensitive     AMPICILLIN/SULBACTAM <=2 SENSITIVE Sensitive     PIP/TAZO <=4 SENSITIVE Sensitive     * >=100,000 COLONIES/mL PROTEUS MIRABILIS    Today   Subjective    Danielle Vargas today has no headache,no chest abdominal pain,no new weakness tingling or numbness, feels much better wants to go home today.     Objective   Blood pressure (!) 171/78, pulse 96, temperature 98.6 F (37 C), temperature source Oral, resp. rate 17, height 5\' 1"  (1.549 m), weight  89.8 kg, SpO2 97 %.   Intake/Output Summary (Last 24 hours) at 10/16/2018 0859 Last data filed at 10/16/2018 0630 Gross per 24 hour  Intake 658.3 ml  Output 350 ml  Net 308.3 ml    Exam  Awake Alert, Oriented x 3, No new F.N deficits, chronic left sided weakness, Normal affect [place].AT,PERRAL Supple Neck,No JVD, No cervical lymphadenopathy appriciated.  Symmetrical Chest wall movement, Good air movement bilaterally, CTAB RRR,No Gallops,Rubs or new Murmurs, No Parasternal Heave +ve B.Sounds, Abd Soft, Non tender, No organomegaly appriciated, No rebound -guarding or rigidity. No Cyanosis, Clubbing or edema, No new Rash or bruise   Data Review   CBC w Diff:  Lab Results  Component Value Date   WBC 11.7 (H) 10/15/2018   HGB 12.0 10/15/2018   HGB 12.6 07/07/2017   HCT 37.6 10/15/2018   HCT 38.1 07/07/2017   PLT 219 10/15/2018   PLT 244 07/07/2017   LYMPHOPCT 7 10/14/2018   MONOPCT 4 10/14/2018   EOSPCT 0 10/14/2018   BASOPCT 0 10/14/2018    CMP:  Lab Results  Component Value Date   NA 141 10/15/2018   NA 140 07/07/2017   K 3.6 10/15/2018   CL 109 10/15/2018   CO2 18 (L) 10/15/2018   BUN 19 10/15/2018   BUN 17 07/07/2017   CREATININE 1.31 (H) 10/15/2018   PROT 7.5 10/14/2018   ALBUMIN 2.8 (L) 10/14/2018   BILITOT 1.3 (H) 10/14/2018   ALKPHOS 47 10/14/2018   AST 48 (H) 10/14/2018   ALT 27 10/14/2018  .   Total Time in preparing paper work, data evaluation and todays exam - 45 minutes  Lala Lund M.D on 10/16/2018 at [place]  [phone]

## 2018-10-16 NOTE — Discharge Instructions (Signed)
Follow with Primary MD [name], MD in 7 days   Get CBC, CMP, 2 view Chest X ray -  checked  by Primary MD or SNF MD in 5-7 days   Activity: As tolerated with Full fall precautions use walker/cane & assistance as needed  Disposition SNF  Diet: Heart Healthy Low Carb   Special Instructions: If you have smoked or chewed Tobacco  in the last 2 yrs please stop smoking, stop any regular Alcohol  and or any Recreational drug use.  On your next visit with your primary care physician please Get Medicines reviewed and adjusted.  Please request your Prim.MD to go over all Hospital Tests and Procedure/Radiological results at the follow up, please get all Hospital records sent to your Prim MD by signing hospital release before you go home.  If you experience worsening of your admission symptoms, develop shortness of breath, life threatening emergency, suicidal or homicidal thoughts you must seek medical attention immediately by calling 911 or calling your MD immediately  if symptoms less severe.  You Must read complete instructions/literature along with all the possible adverse reactions/side effects for all the Medicines you take and that have been prescribed to you. Take any new Medicines after you have completely understood and accpet all the possible adverse reactions/side effects.

## 2018-10-16 NOTE — Clinical Social Work Note (Signed)
Clinical Social Work Assessment  Patient Details  Name: Danielle Vargas MRN: [id number] Date of Birth: 1951-08-30  Date of referral:  10/16/18               Reason for consult:  Facility Placement                Permission sought to share information with:  Facility Sport and exercise psychologist, Family Supports Permission granted to share information::  Yes, Verbal Permission Granted  Name::     Nurse, children's::  [facility]  Relationship::  Daughter and Son  Sport and exercise psychologist Information:  [phone]  Housing/Transportation Living arrangements for the past 2 months:  [place] of Information:  Patient Patient Interpreter Needed:  None Criminal Activity/Legal Involvement Pertinent to Current Situation/Hospitalization:  No - Comment as needed Significant Relationships:  Adult Children Lives with:  Facility Resident Do you feel safe going back to the place where you live?  Yes Need for family participation in patient care:  Yes (Comment)(HPOA daughter and son)  Care giving concerns:  CSW received consult regarding discharge planning.  CSW spoke with patient at bedside.  Patient stated " she has been residing at [facility] and will return at discharge.   Social Worker assessment / plan:  CSW spoke with patient and patient will return to [facility] at discharge.  Patient state that both of her children are HPOA for her.  Employment status:  Retired Forensic scientist:  [employer] PT Recommendations:  [place], [place] / Referral to community resources:  [place]  Patient/Family's Response to care:  Patient states agreement with discharge planning back to [facility].  Patient will need PTAR for transportation.  Patient/Family's Understanding of and Emotional Response to Diagnosis, Current Treatment, and Prognosis:  Patient is realistic regarding her medical needs.  Patient  expressed understanding of CSW role in the discharging process.  The patient had no questions regarding medical treatment or plan.  Emotional Assessment Appearance:  Appears stated age Attitude/Demeanor/Rapport:  Engaged Affect (typically observed):  Calm, Appropriate Orientation:  Oriented to Self, Oriented to Place, Oriented to  Time, Oriented to Situation Alcohol / Substance use:  Not Applicable Psych involvement (Current and /or in the community):  No (Comment)  Discharge Needs  Concerns to be addressed:  Discharge Planning Concerns Readmission within the last 30 days:  No Current discharge risk:  Dependent with Mobility Barriers to Discharge:  Continued Medical Work up   [employer], [place] 10/16/2018, 11:18 AM

## 2018-10-17 LAB — URINE CULTURE: Culture: >=100000 — AB

## 2018-10-19 LAB — CULTURE, BLOOD (ROUTINE X 2)
CULTURE: NO GROWTH
Culture: NO GROWTH

## 2018-11-08 ENCOUNTER — Ambulatory Visit (INDEPENDENT_AMBULATORY_CARE_PROVIDER_SITE_OTHER): Payer: Medicare Other | Admitting: *Deleted

## 2018-11-08 DIAGNOSIS — I639 Cerebral infarction, unspecified: Secondary | ICD-10-CM

## 2018-11-08 DIAGNOSIS — I255 Ischemic cardiomyopathy: Secondary | ICD-10-CM

## 2018-11-11 LAB — CUP PACEART REMOTE DEVICE CHECK
Date Time Interrogation Session: 20200227184049
Implantable Pulse Generator Implant Date: 20170705

## 2018-11-15 NOTE — Progress Notes (Signed)
[facility] Summary Report / Loop Recorder 

## 2018-11-16 ENCOUNTER — Ambulatory Visit (INDEPENDENT_AMBULATORY_CARE_PROVIDER_SITE_OTHER): Payer: Medicare Other | Admitting: Nurse Practitioner

## 2018-11-16 ENCOUNTER — Telehealth: Payer: Self-pay | Admitting: *Deleted

## 2018-11-16 ENCOUNTER — Encounter: Payer: Self-pay | Admitting: Nurse Practitioner

## 2018-11-16 VITALS — BP 110/52 | HR 77 | Ht 61.0 in

## 2018-11-16 DIAGNOSIS — I255 Ischemic cardiomyopathy: Secondary | ICD-10-CM | POA: Diagnosis not present

## 2018-11-16 DIAGNOSIS — I2510 Atherosclerotic heart disease of native coronary artery without angina pectoris: Secondary | ICD-10-CM | POA: Diagnosis not present

## 2018-11-16 NOTE — Patient Instructions (Addendum)
We will be checking the following labs today - NONE   Medication Instructions:    Continue with your current medicines.    If you need a refill on your cardiac medications before your next appointment, please call your pharmacy.     Testing/Procedures To Be Arranged:  N/A  Follow-Up:   See [place] in about 6 to 8 months    At [facility], you and your health needs are our priority.  As part of our continuing mission to provide you with exceptional heart care, we have created designated Provider Care Teams.  These Care Teams include your primary Cardiologist (physician) and Advanced Practice Providers (APPs -  Physician Assistants and Nurse Practitioners) who all work together to provide you with the care you need, when you need it.  Special Instructions:  . None  Call the [place] office at [phone] if you have any questions, problems or concerns.

## 2018-11-16 NOTE — Telephone Encounter (Signed)
Faxed ov note via epic.

## 2018-11-16 NOTE — Progress Notes (Signed)
CARDIOLOGY OFFICE NOTE  Date:  11/16/2018    Danielle Vargas: 1951/07/21 Medical Record #[id number]  PCP:  [name], MD  Cardiologist:  Jennings Books    Chief Complaint  Patient presents with  . Follow-up    Seen for Dr. Tamala Julian    History of Present Illness: Danielle Vargas is a 68 y.o. female who presents today for a follow up visit. Seen for Dr. Tamala Julian. This is an approximate 9 month check.   She has a hx of CAD, ischemic cardiomyopathy, OSA, prior CVA, IDDM, HTN, &HLD. 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 This was treated with angioplasty. EF was 35-40% at the time. Follow-up echo 5/15 demonstrated improved LV function with an EF of 55%. Represented last March of 2018 with acute occlusion of the circumflex which was treated with angioplasty. There was acute decrease in LV systolic function to less than 25%.Most recent LVEF was 65% June 2018.  Last seen by Dr. Tamala Julian back in October in 2018 - she was here with her daughter - some concern for intermittent dyspnea but overall felt to be stable. She resides in SNF chronically.   I then saw her back in June of 2019 - she had had an ER visit for chest pain - treated with sl NTG - she was recathed and will be managed medically. She resides at [facility]. She was doing well at my visit - very sedentary - cannot walk - mostly sits and watches TV all day.   Comes in today. Here with her daughter [name] today. No real concerns. Has had recurrrent URI, sepsis - slow to recover. She does not walk. She does not feel well today - says she has a stomach bug - has had diarrhea. Daughter notes that she will have a tendency towards diarrhea. No actual fever noted. But [name] does not feel well today. No chest pain. Breathing is stable. No cardiac concerns noted.   Past Medical History:  Diagnosis Date  .  Acute combined systolic and diastolic heart failure ([place]) 11/19/2016  . Allergy   . Asthma   . CAD (coronary artery disease)    a. s/p lateral STEMI in 2015 2ry to occlusion of OM1 thought to be caused by coronary artery dissection, treated with POBA. b. 11/2016: STEMI with thrombotic occlusion of distal LCx which was treated with balloon angioplasty.   . Cataract    "just beginning"  . Colon polyps 7/07   hyperplastic and adenomatous  . Depression    pt unsure, psychologist said no  . Diverticulosis   . DM (diabetes mellitus) ([place])   . Dyslipidemia   . External hemorrhoid   . Fatty liver   . Gastroparesis   . HTN (hypertension)   . Hyperlipidemia   . 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 Hypothyroidism   . Migraine   . Myocardial infarction ([place])    FEb 2015  . Nephrolithiasis   . Obesity   . Osteoporosis   . Sleep apnea    wears CPAP  . ST elevation myocardial infarction (STEMI) of inferolateral wall ([place]) 11/19/2016  . Stroke [facility])     Past Surgical History:  Procedure Laterality Date  . BREAST REDUCTION SURGERY  '88  . CARPAL TUNNEL RELEASE  2000   right wrist  . CERVICAL DISC SURGERY  '98   fusion  .  CESAREAN SECTION  '79  '84  . COLONOSCOPY    . CORONARY BALLOON ANGIOPLASTY N/A 11/19/2016   Procedure: Coronary Balloon Angioplasty;  Surgeon: Sherren Mocha, MD;  Location: [facility];  Service: Cardiovascular;  Laterality: N/A;  . EP IMPLANTABLE DEVICE N/A 03/12/2016   Procedure: Loop Recorder Insertion;  Surgeon: Will Meredith Leeds, MD;  Location: [facility];  Service: Cardiovascular;  Laterality: N/A;  . LEFT HEART CATH AND CORONARY ANGIOGRAPHY N/A 11/19/2016   Procedure: Left Heart Cath and Coronary Angiography;  Surgeon: Sherren Mocha, MD;  Location: [facility];  Service: Cardiovascular;  Laterality: N/A;  . LEFT HEART CATH AND CORONARY ANGIOGRAPHY N/A 02/03/2018   Procedure: LEFT HEART CATH AND CORONARY ANGIOGRAPHY;  Surgeon: Belva Crome, MD;  Location: [facility];  Service: Cardiovascular;  Laterality: N/A;  . LEFT HEART CATHETERIZATION WITH CORONARY ANGIOGRAM N/A 10/26/2013   Procedure: LEFT HEART CATHETERIZATION WITH CORONARY ANGIOGRAM;  Surgeon: Sinclair Grooms, MD;  Location: [facility] CATH LAB;  Service: Cardiovascular;  Laterality: N/A;  . ROTATOR CUFF REPAIR  '09   left shoulder  . TEE WITHOUT CARDIOVERSION N/A 03/12/2016   Procedure: TRANSESOPHAGEAL ECHOCARDIOGRAM (TEE);  Surgeon: Sanda Klein, MD;  Location: [facility] ENDOSCOPY;  Service: Cardiovascular;  Laterality: N/A;  . TUBAL LIGATION  '85  . ULNAR NERVE REPAIR  '02, '08   left done, then right     Medications: Current Meds  Medication Sig  . acetaminophen (TYLENOL) 325 MG tablet Take 650 mg by mouth every 6 (six) hours as needed for mild pain.   Marland Kitchen albuterol (PROVENTIL HFA;VENTOLIN HFA) 108 (90 BASE) MCG/ACT inhaler Inhale 2 puffs into the lungs every 6 (six) hours as needed for wheezing or shortness of breath.   Marland Kitchen albuterol (PROVENTIL) (2.5 MG/3ML) 0.083% nebulizer solution Take 2.5 mg by nebulization every 6 (six) hours as needed for wheezing (or coughing).  Marland Kitchen aspirin EC 81 MG EC tablet Take 1 tablet (81 mg total) by mouth daily.  . budesonide-formoterol (SYMBICORT) 160-4.5 MCG/ACT inhaler Inhale 1 puff into the lungs 2 (two) times daily.   . carvedilol (COREG) 6.25 MG tablet Take 6.25 mg by mouth 2 (two) times daily with a meal.  . cefpodoxime (VANTIN) 200 MG tablet Take 1 tablet (200 mg total) by mouth 2 (two) times daily.  . clopidogrel (PLAVIX) 75 MG tablet Take 1 tablet (75 mg total) by mouth daily with breakfast.  . docusate sodium (COLACE) 100 MG capsule Take 100 mg by mouth 2 (two) times daily.  . fenofibrate 160 MG tablet Take 160 mg by mouth at bedtime.   . furosemide (LASIX) 40 MG tablet Take 40 mg by mouth daily.   Marland Kitchen gabapentin (NEURONTIN) 300 MG capsule Take 300 mg by mouth 2 (two) times daily.   Marland Kitchen guaiFENesin 200 MG tablet Take 600 mg by  mouth 2 (two) times daily as needed for cough or to loosen phlegm.  . insulin glargine (LANTUS) 100 UNIT/ML injection Inject 0.7 mLs (70 Units total) into the skin 2 (two) times daily.  . insulin regular (NOVOLIN R,HUMULIN R) 100 units/mL injection Inject 55-70 Units into the skin every evening. Inject 70 units into the skin in the morning, 70 units at 11:30 AM, and 55 units in the evening  . isosorbide mononitrate (IMDUR) 30 MG 24 hr tablet Take 30 mg by mouth daily.  Marland Kitchen levothyroxine (SYNTHROID, LEVOTHROID) 100 MCG tablet Take 1 tablet (100 mcg total) by mouth daily before breakfast.  .  metoCLOPramide (REGLAN) 10 MG tablet Take 10 mg by mouth 2 (two) times daily.   . mometasone (NASONEX) 50 MCG/ACT nasal spray Place 2 sprays into the nose daily.  . montelukast (SINGULAIR) 10 MG tablet Take 10 mg by mouth at bedtime.  . Multiple Vitamin (MULTIVITAMIN WITH MINERALS) TABS tablet Take 1 tablet by mouth daily.   . nitroGLYCERIN (NITROSTAT) 0.4 MG SL tablet Place 1 tablet (0.4 mg total) under the tongue every 5 (five) minutes as needed for chest pain.  . OXYGEN Inhale 2 L into the lungs See admin instructions. CPAP at bedtime as tolerated with a bleed of 2 liters  . pantoprazole (PROTONIX) 40 MG tablet Take 1 tablet (40 mg total) by mouth daily.  Marland Kitchen Phenylephrine-DM-GG (MUCINEX FAST-MAX CONGEST COUGH) 2.5-5-100 MG/5ML LIQD Take 15 mLs by mouth 3 (three) times daily.  . polyethylene glycol (MIRALAX / GLYCOLAX) packet Take 17 g by mouth every other day.   . rosuvastatin (CRESTOR) 20 MG tablet Take 20 mg by mouth at bedtime.   Marland Kitchen spironolactone (ALDACTONE) 25 MG tablet Take 25 mg by mouth daily.  Marland Kitchen talc (ZEASORB) powder Apply 1 application topically See admin instructions. Apply to buttocks topically 2 times a day- for healing, cleanse open area on left buttock with normal saline, apply Zesorb powder, leave open to air while in bed  . tiZANidine (ZANAFLEX) 2 MG tablet Take 2 mg by mouth every 8 (eight) hours  as needed for muscle spasms.   Marland Kitchen topiramate (TOPAMAX) 50 MG tablet Take 50 mg by mouth 2 (two) times daily.  . [DISCONTINUED] chlorhexidine (PERIDEX) 0.12 % solution 15 mLs by Mouth Rinse route See admin instructions. Rinse mouth with 15 ml's two times a day and encourage tooth brushing  . [DISCONTINUED] pantoprazole (PROTONIX) 20 MG tablet Take 20 mg by mouth daily.   Current Facility-Administered Medications for the 11/16/18 encounter (Office Visit) with Burtis Junes, NP  Medication  . mupirocin cream (BACTROBAN) 2 %     Allergies: Allergies  Allergen Reactions  . Invokana [Canagliflozin] Diarrhea  . Nsaids Nausea Only and Other (See Comments)    GI problems/ stomach pain  . Penicillins Rash    Has patient had a PCN reaction causing immediate rash, facial/tongue/throat swelling, SOB or lightheadedness with hypotension: Yes (rash on legs after 3 days use -June 2016) Has patient had a PCN reaction causing severe rash involving mucus membranes or skin necrosis: No Has patient had a PCN reaction that required hospitalization: No Has patient had a PCN reaction occurring within the last 10 years: Yes If all of the above answers are "NO", then may proceed with Cephalosporin use.    Social History: The patient  reports that she has never smoked. She has never used smokeless tobacco. She reports that she does not drink alcohol or use drugs.   Family History: The patient's family history includes COPD in her father; Cirrhosis in her sister; Colon cancer (age of onset: 36) in an other family member; Colon cancer (age of onset: 30) in her paternal aunt; Diabetes in her brother and mother; Emphysema in her father; Heart attack in her brother, mother, and paternal grandfather; Heart failure in her brother and mother; Hypertension in her brother; Kidney disease in her mother; Liver disease in her sister; Lung cancer in her brother; Other in an other family member; Stroke in her father.   Review  of Systems: Please see the history of present illness.   Otherwise, the review of systems is positive  for none.   All other systems are reviewed and negative.   Physical Exam: VS:  BP (!) 110/52   Pulse 77   Ht 5\' 1"  (1.549 m)   SpO2 96%   BMI 37.41 kg/m  .  BMI Body mass index is 37.41 kg/m.  Wt Readings from Last 3 Encounters:  10/16/18 197 lb 15.6 oz (89.8 kg)  03/29/18 208 lb (94.3 kg)  03/25/18 201 lb (91.2 kg)    General: Looks older than her stated age. Wheelchair bound. She is not able to stand to weigh. She is in no acute distress but tells me her belly does not feel well.  Obese.  HEENT: Normal.  Neck: Supple, no JVD, carotid bruits, or masses noted.  Cardiac: Heart tones are distant. No significant edema.  Respiratory:  Lungs are fairly clear to auscultation - little coarse.  GI: Soft and nontender.  MS: No deformity or atrophy. Gait not tested.  Skin: Warm and dry. Color is fair. Neuro:  Strength and sensation are intact and no gross focal deficits noted.  Psych: Alert, appropriate and with normal affect.   LABORATORY DATA:  EKG:  EKG is not ordered today.  Lab Results  Component Value Date   WBC 11.7 (H) 10/15/2018   HGB 12.0 10/15/2018   HCT 37.6 10/15/2018   PLT 219 10/15/2018   GLUCOSE 162 (H) 10/15/2018   CHOL 112 02/03/2018   TRIG 263 (H) 02/03/2018   HDL 19 (L) 02/03/2018   LDLCALC 40 02/03/2018   ALT 27 10/14/2018   AST 48 (H) 10/14/2018   NA 141 10/15/2018   K 3.6 10/15/2018   CL 109 10/15/2018   CREATININE 1.31 (H) 10/15/2018   BUN 19 10/15/2018   CO2 18 (L) 10/15/2018   TSH 3.969 02/03/2018   INR 1.32 10/14/2018   HGBA1C 8.9 (A) 08/17/2018     BNP (last 3 results) Recent Labs    02/03/18 0050  BNP 60.8    ProBNP (last 3 results) No results for input(s): PROBNP in the last 8760 hours.   Other Studies Reviewed Today:  LEFT HEART CATH AND CORONARY ANGIOGRAPHY5/29/19  Conclusion    Coronary angiography only.  Total  occlusion of the mid to distal LAD at the apex. Collaterals are present left and left.  Normal left main  Proximal and mid LAD is widely patent with luminal irregularities up to 40%.  Circumflex is widely patent. The previously occluded second obtuse marginal has recanalized.  Due to technical difficulties from the left radial, the case was terminated prematurely but with what I believe is adequate information to provide direction for management.  RECOMMENDATIONS:   Aspirin and Plavix for at least 30 days and perhaps 6 to 12 months.  Risk factor modification.  If recurrent angina could consider cardiac cath from the femoral approach however both groin contains significant fungal/candidal infection.  Very small right radial artery prevented cath from that approach.  2 g of Ancef given because of difficulty with maintaining sterility in the left arm.   Diagnostic Diagram        Echo Study Conclusions 01/2018  - Left ventricle: The cavity size was normal. Systolic function was normal. The estimated ejection fraction was in the range of 60% to 65%. Hypokinesis of the apical myocardium. Doppler parameters are consistent with abnormal left ventricular relaxation (grade 1 diastolic dysfunction). - Aortic valve: Transvalvular velocity was within the normal range. There was no stenosis. There was no regurgitation. Valve area (VTI): 1.72 cm^2.  Valve area (Vmax): 1.46 cm^2. Valve area (Vmean): 1.43 cm^2. - Mitral valve: Transvalvular velocity was within the normal range. There was no evidence for stenosis. There was no regurgitation. Valve area by pressure half-time: 1.24 cm^2. - Right ventricle: The cavity size was normal. Wall thickness was normal. Systolic function was normal. - Tricuspid valve: There was no regurgitation. - Pericardium, extracardiac: A small pericardial effusion was identified.  Impressions:  - Compared with the echo 02/2017,  apical hypokinesis is new.  Assessment & Plan:  1. CAD with prior NSTEMI from 2019 - s/p cath - this showed total occlusion of mid to distal LAD with left to left collaterals. Mild disease otherwise. Recommended medical therapy. Very small right radial artery resultied in premature termination of cath but had adequate data.If recurrent angina could consider cardiac cath from the femoral approach however both groin had significant fungal/candidal infection. She is maintained on long term DAPT with aspirin/Plavix. She has had no issues with bleeding. She is not using sl NTG.  She denies chest pain - I would favor conservative management going forward.   2. Hx of ICM -last echo with EF of 60 to 65% - grade 1 DD and apical akinesis - she has no current symptoms to suggest CHF. No change with current regimen.   3. HLD - she in on statin   4. Prior stroke - she is pretty much wheelchair bound. She is not able to walk. Very sedentary.   5. DM - per house staff  6. ?Gi illness - defer to house staff.    Current medicines are reviewed with the patient today.  The patient does not have concerns regarding medicines other than what has been noted above.  The following changes have been made:  See above.  Labs/ tests ordered today include:   No orders of the defined types were placed in this encounter.    Disposition:   FU with [place] in 6 to 8 months.   Patient is agreeable to this plan and will call if any problems develop in the interim.   SignedTruitt Merle, NP  11/16/2018 2:31 PM  [facility] [address] [place], [place]  [address] Phone: [phone] Fax: [phone]

## 2018-12-08 ENCOUNTER — Ambulatory Visit (INDEPENDENT_AMBULATORY_CARE_PROVIDER_SITE_OTHER): Payer: Medicare Other | Admitting: *Deleted

## 2018-12-08 ENCOUNTER — Other Ambulatory Visit: Payer: Self-pay

## 2018-12-08 DIAGNOSIS — I639 Cerebral infarction, unspecified: Secondary | ICD-10-CM

## 2018-12-08 LAB — CUP PACEART REMOTE DEVICE CHECK
Date Time Interrogation Session: 20200401033957
Implantable Pulse Generator Implant Date: 20170705

## 2018-12-13 NOTE — Progress Notes (Signed)
[facility] Summary Report / Loop Recorder 

## 2018-12-16 ENCOUNTER — Telehealth: Payer: Self-pay | Admitting: Adult Health

## 2018-12-16 NOTE — Telephone Encounter (Signed)
Patient has appointment scheduled with our office on 12/27/2018 at 11:15 AM.  Due to COVID-19 safety precautions, limiting face-to-face visits and transitioning to telemedicine visits.  She is currently residing at [facility] facility therefore likely inability to reschedule for telemedicine visit.  At prior visit, stable from stroke standpoint therefore we can follow-up with her on an as-needed basis regarding stroke.

## 2018-12-20 NOTE — Telephone Encounter (Signed)
I called [facility] and spoke with Collie Siad.Collie Siad who does the scheduling stated her daughter wanted to cancel appt and r/s later. I stated per Janett Billow Np pt was stable at last visit from a stroke standpoint. Pt can follow up as needed. Collie Siad verbalized understanding and knows appt will be cancel.

## 2018-12-21 ENCOUNTER — Ambulatory Visit (INDEPENDENT_AMBULATORY_CARE_PROVIDER_SITE_OTHER): Payer: Medicare Other | Admitting: Internal Medicine

## 2018-12-21 ENCOUNTER — Other Ambulatory Visit: Payer: Self-pay

## 2018-12-21 ENCOUNTER — Ambulatory Visit: Payer: Medicare Other | Admitting: Internal Medicine

## 2018-12-21 DIAGNOSIS — E039 Hypothyroidism, unspecified: Secondary | ICD-10-CM | POA: Diagnosis not present

## 2018-12-21 DIAGNOSIS — E139 Other specified diabetes mellitus without complications: Secondary | ICD-10-CM | POA: Diagnosis not present

## 2018-12-21 MED ORDER — INSULIN GLARGINE 100 UNIT/ML ~~LOC~~ SOLN: 80.0000 [IU] | Freq: Two times a day (BID) | SUBCUTANEOUS | Status: AC

## 2018-12-21 NOTE — Progress Notes (Signed)
Patient ID: Danielle Vargas, female   DOB: 05/26/1951, 68 y.o.   MRN: [id number]   Patient location: Home My location: Office  Referring Provider: [name], MD  I connected with the patient on 12/21/18 at  10:38 PM EDT by telephone and verified that I am speaking with the correct person. I initially talked to her SNF nurse, which connected me with the patient.   I discussed the limitations of evaluation and management by telephone and the availability of in person appointments. The patient expressed understanding and agreed to proceed.   Details of the encounter are shown below.  HPI: Danielle Vargas is a 68 y.o.-year-old woman, presenting for f/u for of DM - LADA dx 1999, insulin-dependent since 10/2012, uncontrolled, with complications (CAD- s/p STEMI 10/2013, STEMI 2018, AMI 02/2018; cerebro-vascular ds. 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 Last visit 4 months ago. She has [name].  She continues to reside in a SNF after her stroke  She did well after she improved her diet before last visit, however she restarted juice,sweets and bread afterwards her sugars were again increasing at last visit.   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   She is on: - Lantus 70 >> 60 units 2x a day >> 70 units 2x a day - Novolin R:  Before b'fast: 70 units before breakfast  Before lunch: 70 units before lunch  Before dinner: 55  units before dinner If sugars before a meal 70-90, then only give Novolog 30 units before that meal. If sugars before a meal <70, then skip Novolog before that meal. She has been on Glipizide in the past, too. She tried Invokana in the past >> severe diarrhea Also tried U500 >> could not afford this.   Patient has/sugars checked 4 times a day at the facility: - am:  179- 342 >> 200-300 >> 230-368 >> 205-292, 463 - 2h after b'fast: 115-205, 239 >> n/c - before lunch:  202-383 >> 200-300s >> 174-392 >> 205-467 - 2h after lunch:  57x1, 99-247 >> n/c - before dinner:  249- 305 >> 300s >> 220-389 >> 239-356 - 2h after dinner:  111-214 >> 109-273, 339 >> n/c - bedtime:  208-348 >> 190- 313, 497 >> n/c >> 251-421 - nighttime: 75-232 >> 136-291 >> n/c Lowest 125 >>  174 >> 205; she has hypoglycemia awareness in the 90s. Highest sugar was 425 >> 392 >> 467.   -+ CKD, last BUN/creatinine:  Lab Results  Component Value Date   BUN 19 10/15/2018   Lab Results  Component Value Date   CREATININE 1.31 (H) 10/15/2018  On losartan. -+ HL: Lab Results  Component Value Date   CHOL 112 02/03/2018   HDL 19 (L) 02/03/2018   LDLCALC 40 02/03/2018   TRIG 263 (H) 02/03/2018   CHOLHDL 5.9 02/03/2018  02/2015: TG 252 (PCP). On Crestor 20 and fenofibrate. - last eye exam was in  02/2017: +  DR- Dr Zigmund Daniel.  -+ Numbness and tingling in her feet.   Continues Neurontin.   She also has a history of HTN, fatty liver, GERD, morbid obesity, hypothyroidism, migraines, chronic back pain, history of nephrolithiasis, osteoporosis, diverticulosis.  She has OSA >> on CPAP.  She also had pancreatitis (03/21/2015).  She has been admitted for CP 11/13/2015 >> r/o for AMI. She saw Dr Sallyanne Kuster in the hospital, her cardiologist is Dr. Tamala Julian. 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 She is still in a wheelchair. Continues to live in [facility].  She also has hypothyroidism.  Latest TSH was normal: Lab Results  Component  Value Date   TSH 3.969 02/03/2018    On levothyroxine 100 mcg daily. - On multivitamins and Protonix 2.5 hours after levothyroxine, which she was also getting at the time of the normal TFTs in 01/2018  ROS: Constitutional: no weight gain/+ weight loss, no fatigue, no subjective hyperthermia, no subjective hypothermia Eyes: no blurry vision, no xerophthalmia ENT: no sore throat, no nodules palpated in neck, no dysphagia, no odynophagia, no hoarseness Cardiovascular: no CP/no SOB/no palpitations/no leg swelling Respiratory: no cough/no SOB/no wheezing Gastrointestinal: no N/no V/no D/no C/no acid reflux Musculoskeletal: no muscle aches/no joint aches Skin: no rashes, no hair loss Neurological: no tremors/+ numbness/+ tingling/no dizziness  I reviewed pt's medications, allergies, PMH, social hx, family hx, and changes were documented in the history of present illness. Otherwise, unchanged from my initial visit note.  Past Medical History:  Diagnosis Date  . Acute combined systolic and diastolic heart failure ([place]) 11/19/2016  . Allergy   . Asthma   . CAD (coronary artery disease)    a. s/p lateral STEMI in 2015 2ry to occlusion of OM1 thought to be caused by coronary artery dissection, treated with POBA. b. 11/2016: STEMI with thrombotic occlusion of distal LCx which was treated with balloon angioplasty.   . Cataract    "just beginning"  . Colon polyps 7/07   hyperplastic and adenomatous  . Depression    pt unsure, psychologist said no  . Diverticulosis   . DM (diabetes mellitus) ([place])   . Dyslipidemia   . External hemorrhoid   . Fatty liver   . Gastroparesis   . HTN (hypertension)   . Hyperlipidemia   . 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 Hypothyroidism   . Migraine   . Myocardial infarction ([place])    FEb 2015  . Nephrolithiasis   . Obesity   . Osteoporosis   . Sleep apnea    wears CPAP  . ST elevation myocardial infarction (STEMI) of inferolateral wall ([place])  11/19/2016  . Stroke [facility])    Past Surgical History:  Procedure Laterality Date  . BREAST REDUCTION SURGERY  '88  . CARPAL TUNNEL RELEASE  2000   right wrist  . CERVICAL DISC SURGERY  '98   fusion  . CESAREAN SECTION  '79  '84  . COLONOSCOPY    . CORONARY BALLOON ANGIOPLASTY N/A 11/19/2016   Procedure: Coronary Balloon Angioplasty;  Surgeon: Sherren Mocha, MD;  Location: [place] [facility] LAB;  Service: Cardiovascular;  Laterality: N/A;  . EP IMPLANTABLE DEVICE N/A 03/12/2016   Procedure: Loop Recorder Insertion;  Surgeon: Will Meredith Leeds, MD;  Location: [place] CV LAB;  Service: Cardiovascular;  Laterality: N/A;  . LEFT HEART CATH AND CORONARY ANGIOGRAPHY N/A 11/19/2016   Procedure: Left Heart Cath and Coronary Angiography;  Surgeon: Sherren Mocha, MD;  Location: [place] [facility] LAB;  Service: Cardiovascular;  Laterality: N/A;  . LEFT HEART CATH AND CORONARY ANGIOGRAPHY N/A 02/03/2018   Procedure: LEFT HEART CATH AND CORONARY ANGIOGRAPHY;  Surgeon: Belva Crome, MD;  Location: [place] [facility] LAB;  Service: Cardiovascular;  Laterality: N/A;  . LEFT HEART CATHETERIZATION WITH CORONARY ANGIOGRAM N/A 10/26/2013   Procedure: LEFT HEART CATHETERIZATION WITH CORONARY ANGIOGRAM;  Surgeon: Sinclair Grooms, MD;  Location[facility] CATH LAB;  Service: Cardiovascular;  Laterality: N/A;  . ROTATOR CUFF REPAIR  '09   left shoulder  . TEE WITHOUT CARDIOVERSION N/A 03/12/2016   Procedure: TRANSESOPHAGEAL ECHOCARDIOGRAM (TEE);  Surgeon: Sanda Klein, MD;  Location: [facility] ENDOSCOPY;  Service: Cardiovascular;  Laterality: N/A;  . TUBAL LIGATION  '85  . ULNAR NERVE REPAIR  '02, '08   left done, then right   Social History   Socioeconomic History  . Marital status: Divorced    Spouse name: Not on file  . Number of children: 2  . Years of education: Not on file  . Highest education level: Not on file  Occupational History  . Occupation: disabled    Fish farm manager: UNEMPLOYED  Social Needs  . Financial  resource strain: Not on file  . Food insecurity:    Worry: Not on file    Inability: Not on file  . Transportation needs:    Medical: Not on file    Non-medical: Not on file  Tobacco Use  . Smoking status: Never Smoker  . Smokeless tobacco: Never Used  Substance and Sexual Activity  . Alcohol use: No  . Drug use: No  . Sexual activity: Not Currently    Birth control/protection: Post-menopausal  Lifestyle  . Physical activity:    Days per week: Not on file    Minutes per session: Not on file  . Stress: Not on file  Relationships  . Social connections:    Talks on phone: Not on file    Gets together: Not on file    Attends religious service: Not on file    Active member of club or organization: Not on file    Attends meetings of clubs or organizations: Not on file    Relationship status: Not on file  . Intimate partner violence:    Fear of current or ex partner: Not on file    Emotionally abused: Not on file    Physically abused: Not on file    Forced sexual activity: Not on file  Other Topics Concern  . Not on file  Social History Narrative   Lives in [facility] alone.  Retired.  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   Regular exercise: none   Caffeine use: daily; dt coke         Current Outpatient Medications on File Prior to Visit  Medication Sig Dispense Refill  . acetaminophen (TYLENOL) 325 MG tablet Take 650 mg by mouth every 6 (six) hours as needed for mild pain.     Marland Kitchen albuterol (PROVENTIL HFA;VENTOLIN HFA) 108 (90 BASE) MCG/ACT inhaler Inhale 2 puffs into the lungs every 6 (six) hours as needed for wheezing or shortness of breath.     Marland Kitchen albuterol (PROVENTIL) (2.5 MG/3ML) 0.083% nebulizer solution Take 2.5 mg by nebulization every 6 (six) hours as needed for wheezing (or coughing).    Marland Kitchen aspirin EC 81 MG EC tablet Take 1 tablet (81 mg total) by mouth daily.    . budesonide-formoterol (SYMBICORT) 160-4.5 MCG/ACT inhaler Inhale 1 puff into the lungs 2  (two) times daily.     Marland Kitchen  carvedilol (COREG) 6.25 MG tablet Take 6.25 mg by mouth 2 (two) times daily with a meal.    . cefpodoxime (VANTIN) 200 MG tablet Take 1 tablet (200 mg total) by mouth 2 (two) times daily. 10 tablet 0  . clopidogrel (PLAVIX) 75 MG tablet Take 1 tablet (75 mg total) by mouth daily with breakfast. 30 tablet 11  . docusate sodium (COLACE) 100 MG capsule Take 100 mg by mouth 2 (two) times daily.    . fenofibrate 160 MG tablet Take 160 mg by mouth at bedtime.     . furosemide (LASIX) 40 MG tablet Take 40 mg by mouth daily.     Marland Kitchen gabapentin (NEURONTIN) 300 MG capsule Take 300 mg by mouth 2 (two) times daily.     Marland Kitchen guaiFENesin 200 MG tablet Take 600 mg by mouth 2 (two) times daily as needed for cough or to loosen phlegm.    . insulin glargine (LANTUS) 100 UNIT/ML injection Inject 0.7 mLs (70 Units total) into the skin 2 (two) times daily.    . insulin regular (NOVOLIN R,HUMULIN R) 100 units/mL injection Inject 55-70 Units into the skin every evening. Inject 70 units into the skin in the morning, 70 units at 11:30 AM, and 55 units in the evening    . isosorbide mononitrate (IMDUR) 30 MG 24 hr tablet Take 30 mg by mouth daily.    Marland Kitchen levothyroxine (SYNTHROID, LEVOTHROID) 100 MCG tablet Take 1 tablet (100 mcg total) by mouth daily before breakfast.    . metoCLOPramide (REGLAN) 10 MG tablet Take 10 mg by mouth 2 (two) times daily.     . mometasone (NASONEX) 50 MCG/ACT nasal spray Place 2 sprays into the nose daily. 17 g 12  . montelukast (SINGULAIR) 10 MG tablet Take 10 mg by mouth at bedtime.    . Multiple Vitamin (MULTIVITAMIN WITH MINERALS) TABS tablet Take 1 tablet by mouth daily.     . nitroGLYCERIN (NITROSTAT) 0.4 MG SL tablet Place 1 tablet (0.4 mg total) under the tongue every 5 (five) minutes as needed for chest pain. 25 tablet 3  . OXYGEN Inhale 2 L into the lungs See admin instructions. CPAP at bedtime as tolerated with a bleed of 2 liters    . pantoprazole (PROTONIX) 40 MG  tablet Take 1 tablet (40 mg total) by mouth daily.    Marland Kitchen Phenylephrine-DM-GG (MUCINEX FAST-MAX CONGEST COUGH) 2.5-5-100 MG/5ML LIQD Take 15 mLs by mouth 3 (three) times daily.    . polyethylene glycol (MIRALAX / GLYCOLAX) packet Take 17 g by mouth every other day.     . rosuvastatin (CRESTOR) 20 MG tablet Take 20 mg by mouth at bedtime.     Marland Kitchen spironolactone (ALDACTONE) 25 MG tablet Take 25 mg by mouth daily.    Marland Kitchen talc (ZEASORB) powder Apply 1 application topically See admin instructions. Apply to buttocks topically 2 times a day- for healing, cleanse open area on left buttock with normal saline, apply Zesorb powder, leave open to air while in bed    . tiZANidine (ZANAFLEX) 2 MG tablet Take 2 mg by mouth every 8 (eight) hours as needed for muscle spasms.     Marland Kitchen topiramate (TOPAMAX) 50 MG tablet Take 50 mg by mouth 2 (two) times daily.     Current Facility-Administered Medications on File Prior to Visit  Medication Dose Route Frequency Provider Last Rate Last Dose  . mupirocin cream (BACTROBAN) 2 %   Topical BID Edrick Kins, DPM  Allergies  Allergen Reactions  . Invokana [Canagliflozin] Diarrhea  . Nsaids Nausea Only and Other (See Comments)    GI problems/ stomach pain  . Penicillins Rash    Has patient had a PCN reaction causing immediate rash, facial/tongue/throat swelling, SOB or lightheadedness with hypotension: Yes (rash on legs after 3 days use -June 2016) Has patient had a PCN reaction causing severe rash involving mucus membranes or skin necrosis: No Has patient had a PCN reaction that required hospitalization: No Has patient had a PCN reaction occurring within the last 10 years: Yes If all of the above answers are "NO", then may proceed with Cephalosporin use.   Family History  Problem Relation Age of Onset  . COPD Father   . Stroke Father   . Emphysema Father   . Heart failure Mother   . Heart attack Mother        several  . Diabetes Mother   . Kidney disease Mother    . Heart failure Brother   . Hypertension Brother   . Colon cancer Other 45       mat. 1st cousin  . Other Other        celiac sprue, 1/2 brother  . Diabetes Brother        x 3  . Lung cancer Brother   . Cirrhosis Sister        liver transplant  . Liver disease Sister        transplant  . Heart attack Paternal Grandfather   . Heart attack Brother   . Colon cancer Paternal Aunt 69  . Esophageal cancer Neg Hx   . Stomach cancer Neg Hx   . Rectal cancer Neg Hx     PE: There were no vitals taken for this visit. There is no height or weight on file to calculate BMI.  Patient is in a wheelchair and could not be weighed. Wt Readings from Last 3 Encounters:  10/16/18 197 lb 15.6 oz (89.8 kg)  03/29/18 208 lb (94.3 kg)  03/25/18 201 lb (91.2 kg)   Constitutional:  in NAD  The physical exam was not performed (telephone visit).  ASSESSMENT: 1. DM2, insulin-dependent, uncontrolled, with complications - CAD - STEMI 10/26/2013 - had angioplasty, no stent placed. Had 2DEcho on 01/23/2014 EF increased (from 35) to 55%.             - STEMI 11/19/2016. - AMI 02/2018 - cerebro-vascular ds. 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  2. Obesity  3. Hypothyroidism  PLAN:  1. Patient with history of longstanding, uncontrolled, lab data, with increased insulin resistance, on high doses of basal-bolus insulin regimen.  Unfortunately, we cannot use a GLP-1 receptor agonist due to her history of pancreatitis and also, this is not ideal with history of gastroparesis. -At last visit, we discussed about limiting desserts and also drinking juice, which she was still doing in the morning.  At this visit she tells me that she eliminated desserts after lunch and dinner and she is only occasionally drinking juice.  I again advised her  to stop completely.  At last visit, sugars were in the 200s to 300s and we increased her Lantus dose. -The facility sent out sugar logs for the visit today and I reviewed them: Sugars are still  very high and fluctuating, but the lowest CBG is higher than 200.  She also has several blood sugars in the 400s.  Therefore, we will increase the Lantus and are insulin as follows:  -  I suggested:  Patient Instructions  Please increase: - Lantus to 80 units 2x a day - Novolin R:  Before b'fast: 75 units   Before lunch: 75 units   Before dinner: 60 units  If sugars before a meal 70-90, then only give Novolog 30 units before that meal. If sugars before a meal <70, then skip Novolog before that meal.  Please call MD with sugars >500; <50.  Please return in 3-4 months with your sugar log.  - We will check her HbA1c when she returns to the clinic - continue checking sugars at different times of the day - check 4x a day, rotating checks - advised for yearly eye exams >> she is not UTD - Return to clinic in 3-4 mo with sugar log   2. Obesity - class 3 -Exacerbated by her immobility -Unfortunately, we cannot use a GLP-1 receptor agonist or SGLT2 inhibitor so we have to rely on increasing her insulin doses for diabetes control and this also worsens her obesity -We discussed in the past about the need to eliminate desserts, especially the one after dinner and to stay off juice. She tells me she stopped desserts but occasionally has juice -lost 4 lbs recently  3. Hypothyroidism -Controlled - latest thyroid labs reviewed with pt >> normal 01/2018 - she continues on LT4 100 mcg daily - pt feels good on this dose. - we discussed about taking the thyroid hormone every day, with water, >30 minutes before breakfast, separated by >4 hours from acid reflux medications, calcium, iron, multivitamins. Pt. is given this correctly except for getting multivitamins and PPIs only 2.5 hours after levothyroxine  instead of 4. - will check thyroid tests when she comes back to the clinic  - time spent with the patient: 15 min, of which >50% was spent in obtaining information about her symptoms, reviewing her previous labs, evaluations, and treatments, counseling her about her conditions (please see the discussed topics above), and developing a plan to further investigate and treat it.  Philemon Kingdom, MD PhD [facility]

## 2018-12-21 NOTE — Patient Instructions (Addendum)
Please increase: - Lantus to 80 units 2x a day - Novolin R:  Before b'fast: 75 units   Before lunch: 75 units   Before dinner: 60 units  If sugars before a meal 70-90, then only give Novolog 30 units before that meal. If sugars before a meal <70, then skip Novolog before that meal.  Please call MD with sugars >500; <50.  Please return in 3-4 months with your sugar log.

## 2018-12-27 ENCOUNTER — Ambulatory Visit: Payer: Medicare Other | Admitting: Adult Health

## 2019-01-05 ENCOUNTER — Encounter (HOSPITAL_COMMUNITY): Payer: Self-pay | Admitting: Emergency Medicine

## 2019-01-05 ENCOUNTER — Inpatient Hospital Stay (HOSPITAL_COMMUNITY)
Admission: EM | Admit: 2019-01-05 | Discharge: 2019-02-07 | DRG: 871 | Disposition: E | Payer: Medicare Other | Source: Skilled Nursing Facility | Attending: Internal Medicine | Admitting: Internal Medicine

## 2019-01-05 ENCOUNTER — Other Ambulatory Visit: Payer: Self-pay

## 2019-01-05 ENCOUNTER — Emergency Department (HOSPITAL_COMMUNITY): Payer: Medicare Other

## 2019-01-05 DIAGNOSIS — Z888 Allergy status to other drugs, medicaments and biological substances status: Secondary | ICD-10-CM

## 2019-01-05 DIAGNOSIS — N179 Acute kidney failure, unspecified: Secondary | ICD-10-CM | POA: Diagnosis present

## 2019-01-05 DIAGNOSIS — Z841 Family history of disorders of kidney and ureter: Secondary | ICD-10-CM

## 2019-01-05 DIAGNOSIS — Z515 Encounter for palliative care: Secondary | ICD-10-CM | POA: Diagnosis present

## 2019-01-05 DIAGNOSIS — J069 Acute upper respiratory infection, unspecified: Secondary | ICD-10-CM

## 2019-01-05 DIAGNOSIS — Z6837 Body mass index (BMI) 37.0-37.9, adult: Secondary | ICD-10-CM

## 2019-01-05 DIAGNOSIS — I69398 Other sequelae of cerebral infarction: Secondary | ICD-10-CM

## 2019-01-05 DIAGNOSIS — G4731 Primary central sleep apnea: Secondary | ICD-10-CM | POA: Diagnosis present

## 2019-01-05 DIAGNOSIS — A4189 Other specified sepsis: Principal | ICD-10-CM | POA: Diagnosis present

## 2019-01-05 DIAGNOSIS — G4733 Obstructive sleep apnea (adult) (pediatric): Secondary | ICD-10-CM | POA: Diagnosis not present

## 2019-01-05 DIAGNOSIS — Z8249 Family history of ischemic heart disease and other diseases of the circulatory system: Secondary | ICD-10-CM

## 2019-01-05 DIAGNOSIS — G4737 Central sleep apnea in conditions classified elsewhere: Secondary | ICD-10-CM | POA: Diagnosis not present

## 2019-01-05 DIAGNOSIS — Z79899 Other long term (current) drug therapy: Secondary | ICD-10-CM

## 2019-01-05 DIAGNOSIS — Z800 Family history of malignant neoplasm of digestive organs: Secondary | ICD-10-CM

## 2019-01-05 DIAGNOSIS — Z823 Family history of stroke: Secondary | ICD-10-CM

## 2019-01-05 DIAGNOSIS — I2510 Atherosclerotic heart disease of native coronary artery without angina pectoris: Secondary | ICD-10-CM | POA: Diagnosis present

## 2019-01-05 DIAGNOSIS — I252 Old myocardial infarction: Secondary | ICD-10-CM | POA: Diagnosis not present

## 2019-01-05 DIAGNOSIS — Z981 Arthrodesis status: Secondary | ICD-10-CM | POA: Diagnosis not present

## 2019-01-05 DIAGNOSIS — R0602 Shortness of breath: Secondary | ICD-10-CM | POA: Diagnosis not present

## 2019-01-05 DIAGNOSIS — J80 Acute respiratory distress syndrome: Secondary | ICD-10-CM | POA: Diagnosis present

## 2019-01-05 DIAGNOSIS — E139 Other specified diabetes mellitus without complications: Secondary | ICD-10-CM | POA: Diagnosis present

## 2019-01-05 DIAGNOSIS — E039 Hypothyroidism, unspecified: Secondary | ICD-10-CM | POA: Diagnosis present

## 2019-01-05 DIAGNOSIS — Z7902 Long term (current) use of antithrombotics/antiplatelets: Secondary | ICD-10-CM

## 2019-01-05 DIAGNOSIS — I5042 Chronic combined systolic (congestive) and diastolic (congestive) heart failure: Secondary | ICD-10-CM | POA: Diagnosis present

## 2019-01-05 DIAGNOSIS — L89153 Pressure ulcer of sacral region, stage 3: Secondary | ICD-10-CM | POA: Diagnosis present

## 2019-01-05 DIAGNOSIS — Z7982 Long term (current) use of aspirin: Secondary | ICD-10-CM

## 2019-01-05 DIAGNOSIS — Z8379 Family history of other diseases of the digestive system: Secondary | ICD-10-CM

## 2019-01-05 DIAGNOSIS — Z66 Do not resuscitate: Secondary | ICD-10-CM | POA: Diagnosis present

## 2019-01-05 DIAGNOSIS — E133399 Other specified diabetes mellitus with moderate nonproliferative diabetic retinopathy without macular edema, unspecified eye: Secondary | ICD-10-CM | POA: Diagnosis not present

## 2019-01-05 DIAGNOSIS — Z825 Family history of asthma and other chronic lower respiratory diseases: Secondary | ICD-10-CM

## 2019-01-05 DIAGNOSIS — Z886 Allergy status to analgesic agent status: Secondary | ICD-10-CM

## 2019-01-05 DIAGNOSIS — Z993 Dependence on wheelchair: Secondary | ICD-10-CM | POA: Diagnosis not present

## 2019-01-05 DIAGNOSIS — Z801 Family history of malignant neoplasm of trachea, bronchus and lung: Secondary | ICD-10-CM

## 2019-01-05 DIAGNOSIS — N289 Disorder of kidney and ureter, unspecified: Secondary | ICD-10-CM

## 2019-01-05 DIAGNOSIS — L89152 Pressure ulcer of sacral region, stage 2: Secondary | ICD-10-CM | POA: Diagnosis not present

## 2019-01-05 DIAGNOSIS — I63511 Cerebral infarction due to unspecified occlusion or stenosis of right middle cerebral artery: Secondary | ICD-10-CM | POA: Diagnosis present

## 2019-01-05 DIAGNOSIS — I5032 Chronic diastolic (congestive) heart failure: Secondary | ICD-10-CM | POA: Diagnosis present

## 2019-01-05 DIAGNOSIS — G9341 Metabolic encephalopathy: Secondary | ICD-10-CM | POA: Diagnosis present

## 2019-01-05 DIAGNOSIS — I110 Hypertensive heart disease with heart failure: Secondary | ICD-10-CM | POA: Diagnosis present

## 2019-01-05 DIAGNOSIS — Z79890 Hormone replacement therapy (postmenopausal): Secondary | ICD-10-CM

## 2019-01-05 DIAGNOSIS — Z833 Family history of diabetes mellitus: Secondary | ICD-10-CM

## 2019-01-05 DIAGNOSIS — Z880 Allergy status to penicillin: Secondary | ICD-10-CM

## 2019-01-05 DIAGNOSIS — I10 Essential (primary) hypertension: Secondary | ICD-10-CM | POA: Diagnosis present

## 2019-01-05 DIAGNOSIS — Z794 Long term (current) use of insulin: Secondary | ICD-10-CM

## 2019-01-05 DIAGNOSIS — Z9989 Dependence on other enabling machines and devices: Secondary | ICD-10-CM

## 2019-01-05 LAB — CBG MONITORING, ED: Glucose-Capillary: 271 mg/dL — ABNORMAL HIGH (ref 70–99)

## 2019-01-05 LAB — D-DIMER, QUANTITATIVE: D-Dimer, Quant: 0.79 ug/mL-FEU — ABNORMAL HIGH (ref 0.00–0.50)

## 2019-01-05 LAB — CBC WITH DIFFERENTIAL/PLATELET
Abs Immature Granulocytes: 0.02 10*3/uL (ref 0.00–0.07)
Basophils Absolute: 0 10*3/uL (ref 0.0–0.1)
Basophils Relative: 1 %
Eosinophils Absolute: 0 10*3/uL (ref 0.0–0.5)
Eosinophils Relative: 0 %
HCT: 41.1 % (ref 36.0–46.0)
Hemoglobin: 12.6 g/dL (ref 12.0–15.0)
Immature Granulocytes: 1 %
Lymphocytes Relative: 35 %
Lymphs Abs: 1.3 10*3/uL (ref 0.7–4.0)
MCH: 30.5 pg (ref 26.0–34.0)
MCHC: 30.7 g/dL (ref 30.0–36.0)
MCV: 99.5 fL (ref 80.0–100.0)
Monocytes Absolute: 0.3 10*3/uL (ref 0.1–1.0)
Monocytes Relative: 8 %
Neutro Abs: 2.1 10*3/uL (ref 1.7–7.7)
Neutrophils Relative %: 55 %
Platelets: 168 10*3/uL (ref 150–400)
RBC: 4.13 MIL/uL (ref 3.87–5.11)
RDW: 14.1 % (ref 11.5–15.5)
WBC: 3.7 10*3/uL — ABNORMAL LOW (ref 4.0–10.5)
nRBC: 0 % (ref 0.0–0.2)

## 2019-01-05 LAB — POCT I-STAT 7, (LYTES, BLD GAS, ICA,H+H)
Acid-base deficit: 5 mmol/L — ABNORMAL HIGH (ref 0.0–2.0)
Bicarbonate: 20.3 mmol/L (ref 20.0–28.0)
Calcium, Ion: 1.23 mmol/L (ref 1.15–1.40)
HCT: 34 % — ABNORMAL LOW (ref 36.0–46.0)
Hemoglobin: 11.6 g/dL — ABNORMAL LOW (ref 12.0–15.0)
O2 Saturation: 97 %
Patient temperature: 99.5
Potassium: 3.6 mmol/L (ref 3.5–5.1)
Sodium: 140 mmol/L (ref 135–145)
TCO2: 21 mmol/L — ABNORMAL LOW (ref 22–32)
pCO2 arterial: 37.7 mmHg (ref 32.0–48.0)
pH, Arterial: 7.341 — ABNORMAL LOW (ref 7.350–7.450)
pO2, Arterial: 101 mmHg (ref 83.0–108.0)

## 2019-01-05 LAB — COMPREHENSIVE METABOLIC PANEL
ALT: 22 U/L (ref 0–44)
AST: 49 U/L — ABNORMAL HIGH (ref 15–41)
Albumin: 2.6 g/dL — ABNORMAL LOW (ref 3.5–5.0)
Alkaline Phosphatase: 37 U/L — ABNORMAL LOW (ref 38–126)
Anion gap: 15 (ref 5–15)
BUN: 34 mg/dL — ABNORMAL HIGH (ref 8–23)
CO2: 19 mmol/L — ABNORMAL LOW (ref 22–32)
Calcium: 8.6 mg/dL — ABNORMAL LOW (ref 8.9–10.3)
Chloride: 107 mmol/L (ref 98–111)
Creatinine, Ser: 2.06 mg/dL — ABNORMAL HIGH (ref 0.44–1.00)
GFR calc Af Amer: 28 mL/min — ABNORMAL LOW (ref >60)
GFR calc non Af Amer: 24 mL/min — ABNORMAL LOW (ref >60)
Glucose, Bld: 271 mg/dL — ABNORMAL HIGH (ref 70–99)
Potassium: 3.5 mmol/L (ref 3.5–5.1)
Sodium: 141 mmol/L (ref 135–145)
Total Bilirubin: 1 mg/dL (ref 0.3–1.2)
Total Protein: 6.7 g/dL (ref 6.5–8.1)

## 2019-01-05 LAB — TRIGLYCERIDES: Triglycerides: 232 mg/dL — ABNORMAL HIGH (ref <150)

## 2019-01-05 LAB — SARS CORONAVIRUS 2 BY RT PCR (HOSPITAL ORDER, PERFORMED IN ~~LOC~~ HOSPITAL LAB): SARS Coronavirus 2: POSITIVE — AB

## 2019-01-05 LAB — FERRITIN: Ferritin: 436 ng/mL — ABNORMAL HIGH (ref 11–307)

## 2019-01-05 LAB — LACTATE DEHYDROGENASE: LDH: 163 U/L (ref 98–192)

## 2019-01-05 LAB — PROCALCITONIN: Procalcitonin: 0.15 ng/mL

## 2019-01-05 LAB — TROPONIN I: Troponin I: <0.03 ng/mL (ref <0.03)

## 2019-01-05 LAB — C-REACTIVE PROTEIN: CRP: 6.1 mg/dL — ABNORMAL HIGH (ref <1.0)

## 2019-01-05 LAB — FIBRINOGEN: Fibrinogen: 553 mg/dL — ABNORMAL HIGH (ref 210–475)

## 2019-01-05 LAB — LACTIC ACID, PLASMA: Lactic Acid, Venous: 2.3 mmol/L (ref 0.5–1.9)

## 2019-01-05 MED ORDER — MORPHINE SULFATE (CONCENTRATE) 10 MG/0.5ML PO SOLN: 5.0000 mg | ORAL | Status: DC | PRN

## 2019-01-05 MED ORDER — FLUTICASONE PROPIONATE 50 MCG/ACT NA SUSP: 2.0000 | Freq: Every day | NASAL | Status: DC

## 2019-01-05 MED ORDER — MONTELUKAST SODIUM 10 MG PO TABS
10.0000 mg | ORAL_TABLET | Freq: Every day | ORAL | Status: DC
  Administered 2019-01-05: 10 mg via ORAL
  Filled 2019-01-05: qty 1

## 2019-01-05 MED ORDER — LORAZEPAM 1 MG PO TABS: 1.0000 mg | ORAL_TABLET | ORAL | Status: DC | PRN

## 2019-01-05 MED ORDER — GLYCOPYRROLATE 0.2 MG/ML IJ SOLN: 0.2000 mg | INTRAMUSCULAR | Status: DC | PRN

## 2019-01-05 MED ORDER — ONDANSETRON HCL 4 MG/2ML IJ SOLN: 4.0000 mg | Freq: Four times a day (QID) | INTRAMUSCULAR | Status: DC | PRN

## 2019-01-05 MED ORDER — FUROSEMIDE 40 MG PO TABS
40.0000 mg | ORAL_TABLET | Freq: Every day | ORAL | Status: DC
  Administered 2019-01-06: 40 mg via ORAL
  Filled 2019-01-05: qty 1

## 2019-01-05 MED ORDER — TIZANIDINE HCL 2 MG PO TABS
2.0000 mg | ORAL_TABLET | Freq: Three times a day (TID) | ORAL | Status: DC | PRN
  Filled 2019-01-05: qty 1

## 2019-01-05 MED ORDER — BIOTENE DRY MOUTH MT LIQD: 15.0000 mL | OROMUCOSAL | Status: DC | PRN

## 2019-01-05 MED ORDER — TALC EX POWD: 1.0000 "application " | CUTANEOUS | Status: DC

## 2019-01-05 MED ORDER — SODIUM CHLORIDE 0.9 % IV BOLUS
1000.0000 mL | Freq: Once | INTRAVENOUS | Status: AC
  Administered 2019-01-05: 1000 mL via INTRAVENOUS

## 2019-01-05 MED ORDER — ACETAMINOPHEN 650 MG RE SUPP
650.0000 mg | Freq: Once | RECTAL | Status: AC
  Administered 2019-01-05: 650 mg via RECTAL
  Filled 2019-01-05: qty 1

## 2019-01-05 MED ORDER — ACETAMINOPHEN 325 MG PO TABS: 650.0000 mg | ORAL_TABLET | Freq: Four times a day (QID) | ORAL | Status: DC | PRN

## 2019-01-05 MED ORDER — SODIUM CHLORIDE 0.9 % IV SOLN
1.0000 g | Freq: Once | INTRAVENOUS | Status: AC
  Administered 2019-01-05: 1 g via INTRAVENOUS
  Filled 2019-01-05: qty 10

## 2019-01-05 MED ORDER — SODIUM CHLORIDE 0.9% FLUSH: 3.0000 mL | INTRAVENOUS | Status: DC | PRN

## 2019-01-05 MED ORDER — ONDANSETRON 4 MG PO TBDP: 4.0000 mg | ORAL_TABLET | Freq: Four times a day (QID) | ORAL | Status: DC | PRN

## 2019-01-05 MED ORDER — POLYETHYLENE GLYCOL 3350 17 G PO PACK: 17.0000 g | PACK | ORAL | Status: DC

## 2019-01-05 MED ORDER — SODIUM CHLORIDE 0.9 % IV SOLN
12.5000 mg | Freq: Four times a day (QID) | INTRAVENOUS | Status: DC | PRN
  Filled 2019-01-05: qty 0.5

## 2019-01-05 MED ORDER — CLOPIDOGREL BISULFATE 75 MG PO TABS
75.0000 mg | ORAL_TABLET | Freq: Every day | ORAL | Status: DC
  Administered 2019-01-06: 75 mg via ORAL
  Filled 2019-01-05: qty 1

## 2019-01-05 MED ORDER — ACETAMINOPHEN 325 MG PO TABS
650.0000 mg | ORAL_TABLET | Freq: Four times a day (QID) | ORAL | Status: DC | PRN
  Administered 2019-01-06: 650 mg via ORAL

## 2019-01-05 MED ORDER — SODIUM CHLORIDE 0.9 % IV SOLN
500.0000 mg | Freq: Once | INTRAVENOUS | Status: AC
  Administered 2019-01-05: 500 mg via INTRAVENOUS
  Filled 2019-01-05: qty 500

## 2019-01-05 MED ORDER — HALOPERIDOL LACTATE 2 MG/ML PO CONC
0.5000 mg | ORAL | Status: DC | PRN
  Filled 2019-01-05: qty 0.3

## 2019-01-05 MED ORDER — MOMETASONE FURO-FORMOTEROL FUM 200-5 MCG/ACT IN AERO
2.0000 | INHALATION_SPRAY | Freq: Two times a day (BID) | RESPIRATORY_TRACT | Status: DC
  Administered 2019-01-05 – 2019-01-06 (×2): 2 via RESPIRATORY_TRACT
  Filled 2019-01-05: qty 8.8

## 2019-01-05 MED ORDER — DIPHENHYDRAMINE HCL 50 MG/ML IJ SOLN: 12.5000 mg | INTRAMUSCULAR | Status: DC | PRN

## 2019-01-05 MED ORDER — PANTOPRAZOLE SODIUM 40 MG PO TBEC
40.0000 mg | DELAYED_RELEASE_TABLET | Freq: Every day | ORAL | Status: DC
  Administered 2019-01-06: 40 mg via ORAL
  Filled 2019-01-05: qty 1

## 2019-01-05 MED ORDER — GABAPENTIN 300 MG PO CAPS
300.0000 mg | ORAL_CAPSULE | Freq: Two times a day (BID) | ORAL | Status: DC
  Administered 2019-01-05 – 2019-01-06 (×2): 300 mg via ORAL
  Filled 2019-01-05 (×2): qty 1

## 2019-01-05 MED ORDER — BISACODYL 10 MG RE SUPP: 10.0000 mg | Freq: Every day | RECTAL | Status: DC | PRN

## 2019-01-05 MED ORDER — HALOPERIDOL LACTATE 5 MG/ML IJ SOLN: 0.5000 mg | INTRAMUSCULAR | Status: DC | PRN

## 2019-01-05 MED ORDER — GUAIFENESIN 200 MG PO TABS: 600.0000 mg | ORAL_TABLET | Freq: Two times a day (BID) | ORAL | Status: DC | PRN

## 2019-01-05 MED ORDER — SODIUM CHLORIDE 0.9 % IV SOLN: 250.0000 mL | INTRAVENOUS | Status: DC | PRN

## 2019-01-05 MED ORDER — LORAZEPAM 2 MG/ML PO CONC: 1.0000 mg | ORAL | Status: DC | PRN

## 2019-01-05 MED ORDER — POLYVINYL ALCOHOL 1.4 % OP SOLN
1.0000 [drp] | Freq: Four times a day (QID) | OPHTHALMIC | Status: DC | PRN
  Filled 2019-01-05: qty 15

## 2019-01-05 MED ORDER — METOCLOPRAMIDE HCL 10 MG PO TABS
10.0000 mg | ORAL_TABLET | Freq: Two times a day (BID) | ORAL | Status: DC
  Administered 2019-01-05 – 2019-01-06 (×2): 10 mg via ORAL
  Filled 2019-01-05 (×2): qty 1

## 2019-01-05 MED ORDER — SODIUM CHLORIDE 0.9% FLUSH
3.0000 mL | Freq: Two times a day (BID) | INTRAVENOUS | Status: DC
  Administered 2019-01-05 – 2019-01-06 (×2): 3 mL via INTRAVENOUS

## 2019-01-05 MED ORDER — LORAZEPAM 2 MG/ML IJ SOLN: 1.0000 mg | INTRAMUSCULAR | Status: DC | PRN

## 2019-01-05 MED ORDER — GLYCOPYRROLATE 1 MG PO TABS
1.0000 mg | ORAL_TABLET | ORAL | Status: DC | PRN
  Filled 2019-01-05: qty 1

## 2019-01-05 MED ORDER — LORAZEPAM 2 MG/ML IJ SOLN
1.0000 mg | INTRAMUSCULAR | Status: DC | PRN
  Administered 2019-01-08 – 2019-01-09 (×2): 1 mg via INTRAVENOUS
  Filled 2019-01-05 (×2): qty 1

## 2019-01-05 MED ORDER — ACETAMINOPHEN 650 MG RE SUPP
650.0000 mg | Freq: Four times a day (QID) | RECTAL | Status: DC | PRN
  Administered 2019-01-07 – 2019-01-09 (×3): 650 mg via RECTAL
  Filled 2019-01-05 (×4): qty 1

## 2019-01-05 MED ORDER — ALBUTEROL SULFATE HFA 108 (90 BASE) MCG/ACT IN AERS
2.0000 | INHALATION_SPRAY | Freq: Four times a day (QID) | RESPIRATORY_TRACT | Status: DC | PRN
  Filled 2019-01-05: qty 6.7

## 2019-01-05 MED ORDER — TOPIRAMATE 25 MG PO TABS
50.0000 mg | ORAL_TABLET | Freq: Two times a day (BID) | ORAL | Status: DC
  Administered 2019-01-05 – 2019-01-06 (×2): 50 mg via ORAL
  Filled 2019-01-05 (×10): qty 2

## 2019-01-05 MED ORDER — ASPIRIN 81 MG PO TBEC: 81.0000 mg | DELAYED_RELEASE_TABLET | Freq: Every day | ORAL | Status: DC

## 2019-01-05 MED ORDER — ASPIRIN EC 81 MG PO TBEC
81.0000 mg | DELAYED_RELEASE_TABLET | Freq: Every day | ORAL | Status: DC
  Administered 2019-01-06: 81 mg via ORAL
  Filled 2019-01-05: qty 1

## 2019-01-05 MED ORDER — HALOPERIDOL 0.5 MG PO TABS
0.5000 mg | ORAL_TABLET | ORAL | Status: DC | PRN
  Filled 2019-01-05: qty 1

## 2019-01-05 MED ORDER — MORPHINE SULFATE (PF) 2 MG/ML IV SOLN: 1.0000 mg | INTRAVENOUS | Status: DC | PRN

## 2019-01-05 MED ORDER — DOCUSATE SODIUM 100 MG PO CAPS
100.0000 mg | ORAL_CAPSULE | Freq: Two times a day (BID) | ORAL | Status: DC
  Administered 2019-01-05 – 2019-01-06 (×2): 100 mg via ORAL
  Filled 2019-01-05 (×2): qty 1

## 2019-01-05 NOTE — ED Notes (Signed)
Pt transferred to [facility] via [facility]. Report given to Thurmond Butts, Therapist, sports.

## 2019-01-05 NOTE — ED Provider Notes (Signed)
[facility] EMERGENCY DEPARTMENT Provider Note   CSN: [id number] Arrival date & time:       History   Chief Complaint Chief Complaint  Patient presents with  . Shortness of Breath  . Altered Mental Status    HPI Danielle Vargas is a 68 y.o. female.     Pt presents to the ED today with AMS.  The pt lives at [facility] where there is a current outbreak of Covid-19.  Per EMS, pt has tested + for Covid.  She apparently has been doing well until today when she became altered.  When EMS arrived, her O2 sat was 80% on RA.  She was placed on a 100% NRB.  The pt will only open her eyes when her name is called.  Per EMS, she is normally alert and talkative.     Past Medical History:  Diagnosis Date  . Acute combined systolic and diastolic heart failure ([place]) 11/19/2016  . Allergy   . Asthma   . CAD (coronary artery disease)    a. s/p lateral STEMI in 2015 2ry to occlusion of OM1 thought to be caused by coronary artery dissection, treated with POBA. b. 11/2016: STEMI with thrombotic occlusion of distal LCx which was treated with balloon angioplasty.   . Cataract    "just beginning"  . Colon polyps 7/07   hyperplastic and adenomatous  . Depression    pt unsure, psychologist said no  . Diverticulosis   . DM (diabetes mellitus) ([place])   . Dyslipidemia   . External hemorrhoid   . Fatty liver   . Gastroparesis   . HTN (hypertension)   . Hyperlipidemia   . 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 Hypothyroidism   . Migraine   . Myocardial infarction ([place])    FEb 2015  . Nephrolithiasis   . Obesity   . Osteoporosis   . Sleep apnea    wears CPAP  . ST elevation myocardial infarction (STEMI) of inferolateral wall ([place]) 11/19/2016  . Stroke [facility])     Patient Active Problem List   Diagnosis Date Noted  . Sepsis ([place]) 10/14/2018  . Pressure injury of skin 02/04/2018  . Acute myocardial infarction ([place]) 02/03/2018  . Pre-syncope 11/28/2016  . Chronic combined  systolic and diastolic heart failure ([place]) 11/19/2016  . ST elevation myocardial infarction (STEMI) of inferolateral wall ([place]) 11/19/2016  . 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 Left spastic hemiparesis ([place]) 07/15/2016  . Bursitis of right hip 04/01/2016  . Vascular headache   . 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 Asthma   . Benign essential HTN   . S/P rotator cuff repair   . AKI (acute kidney injury) ([place])   . Acute right MCA stroke ([place])   . Back pain   . Ischemic stroke ([place]) 03/10/2016  . Stroke (cerebrum) ([place]) 03/10/2016  . Hypothyroidism   . 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 UTI (urinary tract infection) 06/16/2015  . Family hx of colon cancer 04/17/2015  . OSA on CPAP 12/08/2014  . Retinopathy due to secondary diabetes mellitus, without macular edema, with moderate nonproliferative retinopathy ([place]) 09/18/2014  . CAD in native artery 01/23/2014  . Morbid obesity ([place]) 01/23/2014  . Renal insufficiency 11/17/2013  . HTN (hypertension) 10/29/2013  . Chronic diastolic heart failure ([place]) 10/29/2013  . 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 LADA (latent autoimmune diabetes in adults),  managed as type 1 ([place]) 08/08/2013    Past Surgical History:  Procedure Laterality Date  . BREAST REDUCTION SURGERY  '88  . CARPAL TUNNEL RELEASE  2000   right wrist  . CERVICAL DISC SURGERY  '98   fusion  . CESAREAN SECTION  '79  '84  . COLONOSCOPY    . CORONARY BALLOON ANGIOPLASTY N/A 11/19/2016   Procedure: Coronary Balloon Angioplasty;  Surgeon: Sherren Mocha, MD;  Location: [facility];  Service: Cardiovascular;  Laterality: N/A;  . EP IMPLANTABLE DEVICE N/A 03/12/2016   Procedure: Loop Recorder Insertion;  Surgeon: Will Meredith Leeds, MD;  Location: [facility];  Service: Cardiovascular;  Laterality: N/A;  . LEFT HEART CATH AND  CORONARY ANGIOGRAPHY N/A 11/19/2016   Procedure: Left Heart Cath and Coronary Angiography;  Surgeon: Sherren Mocha, MD;  Location: [facility];  Service: Cardiovascular;  Laterality: N/A;  . LEFT HEART CATH AND CORONARY ANGIOGRAPHY N/A 02/03/2018   Procedure: LEFT HEART CATH AND CORONARY ANGIOGRAPHY;  Surgeon: Belva Crome, MD;  Location: [facility];  Service: Cardiovascular;  Laterality: N/A;  . LEFT HEART CATHETERIZATION WITH CORONARY ANGIOGRAM N/A 10/26/2013   Procedure: LEFT HEART CATHETERIZATION WITH CORONARY ANGIOGRAM;  Surgeon: Sinclair Grooms, MD;  Location: [facility] CATH LAB;  Service: Cardiovascular;  Laterality: N/A;  . ROTATOR CUFF REPAIR  '09   left shoulder  . TEE WITHOUT CARDIOVERSION N/A 03/12/2016   Procedure: TRANSESOPHAGEAL ECHOCARDIOGRAM (TEE);  Surgeon: Sanda Klein, MD;  Location: [facility] ENDOSCOPY;  Service: Cardiovascular;  Laterality: N/A;  . TUBAL LIGATION  '85  . ULNAR NERVE REPAIR  '02, '08   left done, then right     OB History   No obstetric history on file.      Home Medications    Prior to Admission medications   Medication Sig Start Date End Date Taking? Authorizing Provider  acetaminophen (TYLENOL) 325 MG tablet Take 650 mg by mouth every 6 (six) hours as needed for mild pain.     [provider]  albuterol (PROVENTIL HFA;VENTOLIN HFA) 108 (90 BASE) MCG/ACT inhaler Inhale 2 puffs into the lungs every 6 (six) hours as needed for wheezing or shortness of breath.     [provider]  albuterol (PROVENTIL) (2.5 MG/3ML) 0.083% nebulizer solution Take 2.5 mg by nebulization every 6 (six) hours as needed for wheezing (or coughing).    [provider]  aspirin EC 81 MG EC tablet Take 1 tablet (81 mg total) by mouth daily. 11/26/16   Strader, Fransisco Hertz, PA-C  budesonide-formoterol (SYMBICORT) 160-4.5 MCG/ACT inhaler Inhale 1 puff into the lungs 2 (two) times daily.     [provider]  carvedilol (COREG) 6.25 MG tablet  Take 6.25 mg by mouth 2 (two) times daily with a meal.    [provider]  cefpodoxime (VANTIN) 200 MG tablet Take 1 tablet (200 mg total) by mouth 2 (two) times daily. 10/16/18   Thurnell Lose, MD  clopidogrel (PLAVIX) 75 MG tablet Take 1 tablet (75 mg total) by mouth daily with breakfast. 02/06/18   Bhagat, Bhavinkumar, PA  docusate sodium (COLACE) 100 MG capsule Take 100 mg by mouth 2 (two) times daily.    [provider]  fenofibrate 160 MG tablet Take 160 mg by mouth at bedtime.     [provider]  furosemide (LASIX) 40 MG tablet Take 40 mg by mouth daily.     [provider]  gabapentin (NEURONTIN) 300  MG capsule Take 300 mg by mouth 2 (two) times daily.     [provider]  guaiFENesin 200 MG tablet Take 600 mg by mouth 2 (two) times daily as needed for cough or to loosen phlegm.    [provider]  insulin glargine (LANTUS) 100 UNIT/ML injection Inject 0.8 mLs (80 Units total) into the skin 2 (two) times daily. 12/21/18   Philemon Kingdom, MD  insulin regular (NOVOLIN R,HUMULIN R) 100 units/mL injection Inject 60-75 Units into the skin every evening. Inject 75 units into the skin in the morning, 75 units at 11:30 AM, and 60 units in the evening    [provider]  isosorbide mononitrate (IMDUR) 30 MG 24 hr tablet Take 30 mg by mouth daily.    [provider]  levothyroxine (SYNTHROID, LEVOTHROID) 100 MCG tablet Take 1 tablet (100 mcg total) by mouth daily before breakfast. 04/01/16   Love, Ivan Anchors, PA-C  metoCLOPramide (REGLAN) 10 MG tablet Take 10 mg by mouth 2 (two) times daily.     [provider]  mometasone (NASONEX) 50 MCG/ACT nasal spray Place 2 sprays into the nose daily. 08/30/15   Dohmeier, Asencion Partridge, MD  montelukast (SINGULAIR) 10 MG tablet Take 10 mg by mouth at bedtime.    [provider]  Multiple Vitamin (MULTIVITAMIN WITH MINERALS) TABS tablet Take 1 tablet by mouth daily.     [provider]  nitroGLYCERIN (NITROSTAT) 0.4 MG SL tablet Place 1 tablet (0.4 mg total) under the tongue every 5 (five) minutes as needed for chest pain. 10/29/13   Barrett, Evelene Croon, PA-C  OXYGEN Inhale 2 L into the lungs See admin instructions. CPAP at bedtime as tolerated with a bleed of 2 liters    [provider]  pantoprazole (PROTONIX) 40 MG tablet Take 1 tablet (40 mg total) by mouth daily. 04/01/16   Love, Ivan Anchors, PA-C  Phenylephrine-DM-GG (MUCINEX FAST-MAX CONGEST COUGH) 2.5-5-100 MG/5ML LIQD Take 15 mLs by mouth 3 (three) times daily.    [provider]  polyethylene glycol (MIRALAX / GLYCOLAX) packet Take 17 g by mouth every other day.     [provider]  rosuvastatin (CRESTOR) 20 MG tablet Take 20 mg by mouth at bedtime.     [provider]  spironolactone (ALDACTONE) 25 MG tablet Take 25 mg by mouth daily.    [provider]  talc (ZEASORB) powder Apply 1 application topically See admin instructions. Apply to buttocks topically 2 times a day- for healing, cleanse open area on left buttock with normal saline, apply Zesorb powder, leave open to air while in bed    [provider]  tiZANidine (ZANAFLEX) 2 MG tablet Take 2 mg by mouth every 8 (eight) hours as needed for muscle spasms.  10/26/15   [provider]  topiramate (TOPAMAX) 50 MG tablet Take 50 mg by mouth 2 (two) times daily.    [provider]    Family History Family History  Problem Relation Age of Onset  . COPD Father   . Stroke Father   . Emphysema Father   . Heart failure Mother   . Heart attack Mother        several  . Diabetes Mother   . Kidney disease Mother   . Heart failure Brother   . Hypertension Brother   . Colon cancer Other 45       mat. 1st cousin  . Other Other        celiac  sprue, 1/2 brother  . Diabetes Brother        x 3  . Lung cancer Brother   . Cirrhosis Sister        liver transplant  . Liver disease Sister         transplant  . Heart attack Paternal Grandfather   . Heart attack Brother   . Colon cancer Paternal Aunt 14  . Esophageal cancer Neg Hx   . Stomach cancer Neg Hx   . Rectal cancer Neg Hx     Social History Social History   Tobacco Use  . Smoking status: Never Smoker  . Smokeless tobacco: Never Used  Substance Use Topics  . Alcohol use: No  . Drug use: No     Allergies   Invokana [canagliflozin]; Nsaids; and Penicillins   Review of Systems Review of Systems  Unable to perform ROS: Mental status change     Physical Exam Updated Vital Signs BP (!) 97/33   Pulse 82   Temp 99.5 F (37.5 C) (Rectal)   Resp 16   Wt 90 kg   SpO2 96%   BMI 37.49 kg/m   Physical Exam Vitals signs and nursing note reviewed.  Constitutional:      General: She is in acute distress.     Appearance: She is ill-appearing and toxic-appearing.  HENT:     Head: Normocephalic and atraumatic.     Mouth/Throat:     Mouth: Mucous membranes are dry.  Eyes:     Extraocular Movements: Extraocular movements intact.     Pupils: Pupils are equal, round, and reactive to light.  Neck:     Musculoskeletal: Normal range of motion and neck supple.  Cardiovascular:     Rate and Rhythm: Normal rate and regular rhythm.  Pulmonary:     Effort: Tachypnea and accessory muscle usage present.     Breath sounds: Wheezing present.  Abdominal:     General: Bowel sounds are normal.     Palpations: Abdomen is soft.  Musculoskeletal: Normal range of motion.  Skin:    General: Skin is warm.     Capillary Refill: Capillary refill takes less than 2 seconds.  Neurological:     Mental Status: She is lethargic.     Comments: Per old chart, left hemiparesis.  Now, she is not following any commands.  Only response is eye opening to verbal command.  Psychiatric:     Comments: Unable to assess due to ms change      ED Treatments / Results  Labs (all labs ordered are listed, but only abnormal results are  displayed) Labs Reviewed  LACTIC ACID, PLASMA - Abnormal; Notable for the following components:      Result Value   Lactic Acid, Venous 2.3 (*)    All other components within normal limits  CBC WITH DIFFERENTIAL/PLATELET - Abnormal; Notable for the following components:   WBC 3.7 (*)    All other components within normal limits  COMPREHENSIVE METABOLIC PANEL - Abnormal; Notable for the following components:   CO2 19 (*)    Glucose, Bld 271 (*)    BUN 34 (*)    Creatinine, Ser 2.06 (*)    Calcium 8.6 (*)    Albumin 2.6 (*)    AST 49 (*)    Alkaline Phosphatase 37 (*)    GFR calc non Af Amer 24 (*)    GFR calc Af Amer 28 (*)    All other components within normal limits  D-DIMER, QUANTITATIVE (NOT AT [facility]) - Abnormal; Notable for the following components:   D-Dimer, Quant 0.79 (*)    All other components within normal limits  FERRITIN - Abnormal; Notable for the following components:   Ferritin 436 (*)    All other components within normal limits  TRIGLYCERIDES - Abnormal; Notable for the following components:   Triglycerides 232 (*)    All other components within normal limits  FIBRINOGEN - Abnormal; Notable for the following components:   Fibrinogen 553 (*)    All other components within normal limits  C-REACTIVE PROTEIN - Abnormal; Notable for the following components:   CRP 6.1 (*)    All other components within normal limits  CBG MONITORING, ED - Abnormal; Notable for the following components:   Glucose-Capillary 271 (*)    All other components within normal limits  POCT I-STAT 7, (LYTES, BLD GAS, ICA,H+H) - Abnormal; Notable for the following components:   pH, Arterial 7.341 (*)    TCO2 21 (*)    Acid-base deficit 5.0 (*)    HCT 34.0 (*)    Hemoglobin 11.6 (*)    All other components within normal limits  CULTURE, BLOOD (ROUTINE X 2)  CULTURE, BLOOD (ROUTINE X 2)  SARS CORONAVIRUS 2 (HOSPITAL ORDER, [facility])  PROCALCITONIN  LACTATE  DEHYDROGENASE  TROPONIN I  LACTIC ACID, PLASMA  I-STAT ARTERIAL BLOOD GAS, ED    EKG EKG Interpretation  Date/Time:  Wednesday January 05 2019 13:25:47 EDT Ventricular Rate:  81 PR Interval:    QRS Duration: 95 QT Interval:  400 QTC Calculation: 465 R Axis:   -60 Text Interpretation:  Sinus rhythm Inferior infarct, old Consider anterolateral infarct No significant change since last tracing Confirmed by Isla Pence [phone]) on 12/31/2018 1:54:47 PM   Radiology Dg Chest Port 1 View  Result Date: 01/02/2019 CLINICAL DATA:  Short of breath.  COVID-19 positive EXAM: PORTABLE CHEST 1 VIEW COMPARISON:  10/14/2018 FINDINGS: Cardiac enlargement. Diffuse bilateral airspace disease has developed since the prior study. This could be pneumonia or edema. No significant pleural effusion IMPRESSION: Cardiac enlargement.  Diffuse bilateral edema or pneumonia. Electronically Signed   By: Franchot Gallo M.D.   On: 12/25/2018 14:26    Procedures Procedures (including critical care time)  Medications Ordered in ED Medications  cefTRIAXone (ROCEPHIN) 1 g in sodium chloride 0.9 % 100 mL IVPB (1 g Intravenous New Bag/Given 12/28/2018 1525)  azithromycin (ZITHROMAX) 500 mg in sodium chloride 0.9 % 250 mL IVPB (500 mg Intravenous New Bag/Given 12/14/2018 1522)  sodium chloride 0.9 % bolus 1,000 mL (1,000 mLs Intravenous New Bag/Given 12/13/2018 1346)  acetaminophen (TYLENOL) suppository 650 mg (650 mg Rectal Given 12/26/2018 1355)     Initial Impression / Assessment and Plan / ED Course  I have reviewed the triage vital signs and the nursing notes.  Pertinent labs & imaging results that were available during my care of the patient were reviewed by me and considered in my medical decision making (see chart for details).      Pt placed on oxygen.  She was given IVFs.  She has improved a little bit, but is still seriously ill.    Possible pneumonia on CXR, pt given rocephin and zithromax, but likely from covid.   Pt d/w Dr. Evangeline Gula (triad) for admission.  Dr. Evangeline Gula will call the daughter to discuss code status as pt is unlikely to survive this infection.  CRITICAL CARE Performed by: Isla Pence   Total  critical care time: 45 minutes  Critical care time was exclusive of separately billable procedures and treating other patients.  Critical care was necessary to treat or prevent imminent or life-threatening deterioration.  Critical care was time spent personally by me on the following activities: development of treatment plan with patient and/or surrogate as well as nursing, discussions with consultants, evaluation of patient's response to treatment, examination of patient, obtaining history from patient or surrogate, ordering and performing treatments and interventions, ordering and review of laboratory studies, ordering and review of radiographic studies, pulse oximetry and re-evaluation of patient's condition.  Final Clinical Impressions(s) / ED Diagnoses   Final diagnoses:  Acute respiratory disease due to COVID-19 virus  AKI (acute kidney injury) ([place])  Acute metabolic encephalopathy    ED Discharge Orders    None       Isla Pence, MD 12/08/2018 [phone]

## 2019-01-05 NOTE — H&P (Signed)
History and Physical    Danielle Vargas:[id number] DOB: 22-Feb-1951 DOA: 12/21/2018  PCP: [name], MD  Patient coming from: [place] home  I have personally briefly reviewed patient's old medical records in [place]  Chief Complaint: Abnormal mental status and hypoxia  HPI: Danielle Vargas is a 68 y.o. 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  Patient lives at [place] where there is currently an outbreak of COVID-19.  Patient had tested positive and had been doing well until today when she became altered.  When EMS arrived her O2 sat was 80% on room air.  She was placed on 100% nonrebreather.  Currently the patient has her eyes open but is not responding to any commands.  She is not making any purposeful movement.  According to her daughter she is had a stroke about 2-1/2 years ago and since then has been in a slow decline.  She had been able to walk a little bit but now she is wheelchair-bound and requires a Hoyer lift to get her into the wheelchair.  She has physically been doing fairly poorly but mentally she has been fairly okay.  Her daughter reports a good sense of humor and inability to converse.  Given her respiratory failure she was referred to me for further evaluation and management.  ED Course: Chest x-ray shows diffuse bilateral pneumonia; referred to me for further evaluation and management.  Review of Systems: Level 5 caveat patient is unable to communicate  Past Medical History:  Diagnosis Date  . Acute combined systolic and diastolic heart failure ([place]) 11/19/2016  . Allergy   . Asthma   . CAD (coronary artery disease)    a. s/p lateral STEMI in 2015  2ry to occlusion of OM1 thought to be caused by coronary artery dissection, treated with POBA. b. 11/2016: STEMI with thrombotic occlusion of distal LCx which was treated with balloon angioplasty.   . Cataract    "just beginning"  . Colon polyps 7/07   hyperplastic and adenomatous  . Depression    pt unsure, psychologist said no  . Diverticulosis   . DM (diabetes mellitus) ([place])   . Dyslipidemia   . External hemorrhoid   . Fatty liver   . Gastroparesis   . HTN (hypertension)   . Hyperlipidemia   . 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 Hypothyroidism   . Migraine   . Myocardial infarction ([place])    FEb 2015  . Nephrolithiasis   . Obesity   . Osteoporosis   . Sleep apnea    wears CPAP  . ST elevation myocardial infarction (STEMI) of inferolateral wall ([place]) 11/19/2016  . Stroke [facility])     Past Surgical History:  Procedure Laterality Date  . BREAST REDUCTION SURGERY  '88  . CARPAL TUNNEL RELEASE  2000   right wrist  . CERVICAL DISC SURGERY  '98   fusion  . CESAREAN SECTION  '79  '84  . COLONOSCOPY    . CORONARY BALLOON ANGIOPLASTY N/A 11/19/2016   Procedure: Coronary Balloon Angioplasty;  Surgeon: Sherren Mocha, MD;  Location: [place] CV LAB;  Service: Cardiovascular;  Laterality: N/A;  . EP IMPLANTABLE DEVICE N/A 03/12/2016   Procedure: Loop Recorder Insertion;  Surgeon: [employer],  MD;  Location: [place] CV LAB;  Service: Cardiovascular;  Laterality: N/A;  . LEFT HEART CATH AND CORONARY ANGIOGRAPHY N/A 11/19/2016   Procedure: Left Heart Cath and Coronary Angiography;  Surgeon: Sherren Mocha, MD;  Location: [place] CV LAB;  Service: Cardiovascular;  Laterality: N/A;  . LEFT HEART CATH AND CORONARY ANGIOGRAPHY N/A 02/03/2018   Procedure: LEFT HEART CATH AND CORONARY ANGIOGRAPHY;  Surgeon: Belva Crome, MD;  Location: [place] CV LAB;  Service: Cardiovascular;  Laterality: N/A;  . LEFT HEART CATHETERIZATION WITH CORONARY ANGIOGRAM N/A 10/26/2013    Procedure: LEFT HEART CATHETERIZATION WITH CORONARY ANGIOGRAM;  Surgeon: Sinclair Grooms, MD;  Location: [facility] CATH LAB;  Service: Cardiovascular;  Laterality: N/A;  . ROTATOR CUFF REPAIR  '09   left shoulder  . TEE WITHOUT CARDIOVERSION N/A 03/12/2016   Procedure: TRANSESOPHAGEAL ECHOCARDIOGRAM (TEE);  Surgeon: Sanda Klein, MD;  Location: [facility] ENDOSCOPY;  Service: Cardiovascular;  Laterality: N/A;  . TUBAL LIGATION  '85  . ULNAR NERVE REPAIR  '02, '08   left done, then right    Social History   Social History Narrative   Lives in [place] alone.  Retired.  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   Regular exercise: none   Caffeine use: daily; dt coke           reports that she has never smoked. She has never used smokeless tobacco. She reports that she does not drink alcohol or use drugs.  Allergies  Allergen Reactions  . Invokana [Canagliflozin] Diarrhea  . Nsaids Nausea Only and Other (See Comments)    GI problems/ stomach pain  . Penicillins Rash    Has patient had a PCN reaction causing immediate rash, facial/tongue/throat swelling, SOB or lightheadedness with hypotension: Yes (rash on legs after 3 days use -June 2016) Has patient had a PCN reaction causing severe rash involving mucus membranes or skin necrosis: No Has patient had a PCN reaction that required hospitalization: No Has patient had a PCN reaction occurring within the last 10 years: Yes If all of the above answers are "NO", then may proceed with Cephalosporin use.    Family History  Problem Relation Age of Onset  . COPD Father   . Stroke Father   . Emphysema Father   . Heart failure Mother   . Heart attack Mother        several  . Diabetes Mother   . Kidney disease Mother   . Heart failure Brother   . Hypertension Brother   . Colon cancer Other 45       mat. 1st cousin  . Other Other        celiac sprue, 1/2 brother  . Diabetes Brother        x 3  . Lung cancer Brother   . Cirrhosis  Sister        liver transplant  . Liver disease Sister        transplant  . Heart attack Paternal Grandfather   . Heart attack Brother   . Colon cancer Paternal Aunt 48  . Esophageal cancer Neg Hx   . Stomach cancer Neg Hx   . Rectal cancer Neg Hx      Prior to Admission medications   Medication Sig Start Date End Date Taking? Authorizing Provider  acetaminophen (TYLENOL) 325 MG tablet Take 650 mg by mouth every 6 (six) hours as needed for mild pain.     [provider]  albuterol (  PROVENTIL HFA;VENTOLIN HFA) 108 (90 BASE) MCG/ACT inhaler Inhale 2 puffs into the lungs every 6 (six) hours as needed for wheezing or shortness of breath.     [provider]  albuterol (PROVENTIL) (2.5 MG/3ML) 0.083% nebulizer solution Take 2.5 mg by nebulization every 6 (six) hours as needed for wheezing (or coughing).    [provider]  aspirin EC 81 MG EC tablet Take 1 tablet (81 mg total) by mouth daily. 11/26/16   Strader, Fransisco Hertz, PA-C  budesonide-formoterol (SYMBICORT) 160-4.5 MCG/ACT inhaler Inhale 1 puff into the lungs 2 (two) times daily.     [provider]  carvedilol (COREG) 6.25 MG tablet Take 6.25 mg by mouth 2 (two) times daily with a meal.    [provider]  cefpodoxime (VANTIN) 200 MG tablet Take 1 tablet (200 mg total) by mouth 2 (two) times daily. 10/16/18   Thurnell Lose, MD  clopidogrel (PLAVIX) 75 MG tablet Take 1 tablet (75 mg total) by mouth daily with breakfast. 02/06/18   Bhagat, Bhavinkumar, PA  docusate sodium (COLACE) 100 MG capsule Take 100 mg by mouth 2 (two) times daily.    [provider]  fenofibrate 160 MG tablet Take 160 mg by mouth at bedtime.     [provider]  furosemide (LASIX) 40 MG tablet Take 40 mg by mouth daily.     [provider]  gabapentin (NEURONTIN) 300 MG capsule Take 300 mg by mouth 2 (two) times daily.     [provider]  guaiFENesin 200 MG tablet Take 600 mg by mouth 2  (two) times daily as needed for cough or to loosen phlegm.    [provider]  insulin glargine (LANTUS) 100 UNIT/ML injection Inject 0.8 mLs (80 Units total) into the skin 2 (two) times daily. 12/21/18   Philemon Kingdom, MD  insulin regular (NOVOLIN R,HUMULIN R) 100 units/mL injection Inject 60-75 Units into the skin every evening. Inject 75 units into the skin in the morning, 75 units at 11:30 AM, and 60 units in the evening    [provider]  isosorbide mononitrate (IMDUR) 30 MG 24 hr tablet Take 30 mg by mouth daily.    [provider]  levothyroxine (SYNTHROID, LEVOTHROID) 100 MCG tablet Take 1 tablet (100 mcg total) by mouth daily before breakfast. 04/01/16   Love, Ivan Anchors, PA-C  metoCLOPramide (REGLAN) 10 MG tablet Take 10 mg by mouth 2 (two) times daily.     [provider]  mometasone (NASONEX) 50 MCG/ACT nasal spray Place 2 sprays into the nose daily. 08/30/15   Dohmeier, Asencion Partridge, MD  montelukast (SINGULAIR) 10 MG tablet Take 10 mg by mouth at bedtime.    [provider]  Multiple Vitamin (MULTIVITAMIN WITH MINERALS) TABS tablet Take 1 tablet by mouth daily.     [provider]  nitroGLYCERIN (NITROSTAT) 0.4 MG SL tablet Place 1 tablet (0.4 mg total) under the tongue every 5 (five) minutes as needed for chest pain. 10/29/13   Barrett, Evelene Croon, PA-C  OXYGEN Inhale 2 L into the lungs See admin instructions. CPAP at bedtime as tolerated with a bleed of 2 liters    [provider]  pantoprazole (PROTONIX) 40 MG tablet Take 1 tablet (40 mg total) by mouth daily. 04/01/16   Love, Ivan Anchors, PA-C  Phenylephrine-DM-GG (MUCINEX FAST-MAX CONGEST COUGH) 2.5-5-100 MG/5ML LIQD Take 15 mLs by mouth 3 (three) times daily.    [provider]  polyethylene glycol (MIRALAX / GLYCOLAX)  packet Take 17 g by mouth every other day.     [provider]  rosuvastatin (CRESTOR) 20 MG tablet Take 20 mg by mouth at bedtime.     [provider]  spironolactone (ALDACTONE) 25 MG tablet Take 25 mg by mouth daily.    [provider]  talc (ZEASORB) powder Apply 1 application topically See admin instructions. Apply to buttocks topically 2 times a day- for healing, cleanse open area on left buttock with normal saline, apply Zesorb powder, leave open to air while in bed    [provider]  tiZANidine (ZANAFLEX) 2 MG tablet Take 2 mg by mouth every 8 (eight) hours as needed for muscle spasms.  10/26/15   [provider]  topiramate (TOPAMAX) 50 MG tablet Take 50 mg by mouth 2 (two) times daily.    [provider]    Physical Exam:  Constitutional: In acute distress and ill-appearing she appears toxic and is not responding to questions Vitals:   12/30/2018 1715 12/29/2018 1745 12/25/2018 1857 12/27/2018 1900  BP: (!) 103/36 (!) 98/45 (!) 72/42 (!) 91/54  Pulse: 73 74 72 76  Resp: 17 15 15 16   Temp:   98.3 F (36.8 C)   TempSrc:   Oral   SpO2: 98% 97% 94% 91%  Weight:       Eyes: PERRL, lids and conjunctivae normal ENMT: Mucous membranes are dry. Posterior pharynx clear of any exudate or lesions..  Neck: normal, supple, no masses, no thyromegaly Respiratory: Tight wheezing bilaterally,  no crackles.  Tachypnea with accessory muscle use  Cardiovascular: Regular rate and rhythm, no murmurs / rubs / gallops. No extremity edema. 2+ pedal pulses. No carotid bruits.  Abdomen: no tenderness, no masses palpated. No hepatosplenomegaly. Bowel sounds positive.  Musculoskeletal: no clubbing / cyanosis. No joint deformity upper and lower extremities. Good ROM, no contractures. Normal muscle tone.  Skin: no rashes, lesions, ulcers. No induration Neurologic: Lethargic and not following any commands to me: Eyes are open    Labs on Admission: I have personally reviewed following labs and imaging studies  CBC: Recent Labs  Lab 12/17/2018 1336 12/23/2018 1356  WBC 3.7*  --   NEUTROABS 2.1  --   HGB 12.6  11.6*  HCT 41.1 34.0*  MCV 99.5  --   PLT 168  --    Basic Metabolic Panel: Recent Labs  Lab 12/29/2018 1336 12/09/2018 1356  NA 141 140  K 3.5 3.6  CL 107  --   CO2 19*  --   GLUCOSE 271*  --   BUN 34*  --   CREATININE 2.06*  --   CALCIUM 8.6*  --    GFR: Estimated Creatinine Clearance: 27.4 mL/min (A) (by C-G formula based on SCr of 2.06 mg/dL (H)). Liver Function Tests: Recent Labs  Lab 01/03/2019 1336  AST 49*  ALT 22  ALKPHOS 37*  BILITOT 1.0  PROT 6.7  ALBUMIN 2.6*   Cardiac Enzymes: Recent Labs  Lab 12/14/2018 1336  TROPONINI <0.03   BNP (last 3 results) No results for input(s): PROBNP in the last 8760 hours. HbA1C: No results for input(s): HGBA1C in the last 72 hours. CBG: Recent Labs  Lab 01/06/2019 1324  GLUCAP 271*   Lipid Profile: Recent Labs    12/13/2018 1336  TRIG 232*   Thyroid Function Tests: No results for input(s): TSH, T4TOTAL, FREET4, T3FREE, THYROIDAB in the last 72 hours. Anemia Panel: Recent Labs    12/27/2018 1336  FERRITIN  436*   Urine analysis:    Component Value Date/Time   COLORURINE AMBER (A) 10/14/2018 1605   APPEARANCEUR CLOUDY (A) 10/14/2018 1605   LABSPEC 1.015 10/14/2018 1605   PHURINE 7.0 10/14/2018 1605   GLUCOSEU NEGATIVE 10/14/2018 1605   HGBUR SMALL (A) 10/14/2018 [place] 10/14/2018 1605   KETONESUR NEGATIVE 10/14/2018 1605   PROTEINUR 100 (A) 10/14/2018 1605   UROBILINOGEN 0.2 06/16/2015 1134   NITRITE NEGATIVE 10/14/2018 1605   LEUKOCYTESUR LARGE (A) 10/14/2018 1605    Radiological Exams on Admission: Dg Chest Port 1 View  Result Date: 12/12/2018 CLINICAL DATA:  Short of breath.  COVID-19 positive EXAM: PORTABLE CHEST 1 VIEW COMPARISON:  10/14/2018 FINDINGS: Cardiac enlargement. Diffuse bilateral airspace disease has developed since the prior study. This could be pneumonia or edema. No significant pleural effusion IMPRESSION: Cardiac enlargement.  Diffuse bilateral edema or pneumonia.  Electronically Signed   By: Franchot Gallo M.D.   On: 01/04/2019 14:26    EKG: Independently reviewed. Sinus rhythm Inferior infarct, old Consider anterolateral infarct No significant change since last tracing personally reviewed by me.  Assessment/Plan Principal Problem:   Acute respiratory distress syndrome (ARDS) due to COVID-19 virus Active Problems:   OSA on CPAP   Central sleep apnea secondary to cerebrovascular accident (CVA)   Chronic combined systolic and diastolic heart failure ([place])   COVID-19 virus infection   LADA (latent autoimmune diabetes in adults), managed as type 1 ([place])   Renal insufficiency   Morbid obesity ([place])   Hypothyroidism   Old myocardial infarction   HTN (hypertension)   Chronic diastolic heart failure ([facility])   Retinopathy due to secondary diabetes mellitus, without macular edema, with moderate nonproliferative retinopathy ([place])   Right middle cerebral artery stroke [facility])   Patient noted with the above diagnoses.  Her condition is extremely poor.  I personally called and spoke to her daughter Barnetta Chapel who is her power of attorney and nearest relative and discussed the very poor outcome and very low likelihood of survival.  Given this Barnetta Chapel spoke with her brother and they have requested that the patient be made DNR comfort care.  Goal is that they will be able to see their mother if she is admitted to the [place].  Patient is admitted comfort care DNR.   DVT prophylaxis: End-of-life Code Status: DO NOT RESUSCITATE Family Communication: Have discussion with patient's daughter Barnetta Chapel regarding condition decision made for end-of-life care Disposition Plan: Patient expected to expire in the next 2 to 4 days Consults called: None Admission status: Inpatient  Admit - It is my clinical opinion that admission to INPATIENT is reasonable and necessary because of the expectation that this patient will require hospital care that crosses at least 2  midnights to treat this condition based on the medical complexity of the problems presented.  Given the aforementioned information, the predictability of an adverse outcome is felt to be significant.    Lady Deutscher MD FACP [facility] Pager [phone]  How to contact the [facility] Attending or Consulting provider [place] or covering provider during after hours [place], for this patient?  1. Check the care team in [facility] and look for a) attending/consulting TR[facility] provider listed and b) the [facility] team listed 2. Log into [email or web address] and use 's universal password to access. If you do not have the password, please contact the hospital operator. 3. Locate the [facility] provider you are looking for under [facility] and  page to a number that you can be directly reached. 4. If you still have difficulty reaching the provider, please page the [facility] (Director on Call) for the Hospitalists listed on amion for assistance.  If 7PM-7AM, please contact night-coverage [email or web address] Password [facility]  12/25/2018, 7:46 PM

## 2019-01-05 NOTE — Progress Notes (Addendum)
Chaplain checked with ED RN [place] in response to spiritual consult.  Pt was unavailable, and given situation, chaplain called family contact to offer emotional support and describe available services.  Spoke to daughter, Curt Bears, who acknowledged that everything happened quickly in the last few days and there is a lot of sadness among family members.  She indicated gratitude for chaplain's outreach.  Chaplain oriented daughter to availability of support as needed in the upcoming days.  Luana Shu [phone]     01/02/2019 2100  Clinical Encounter Type  Visited With Family  Visit Type Initial;Psychological support;Critical Care  Referral From  (Spiritual consult)  Consult/Referral To Chaplain  Spiritual Encounters  Spiritual Needs Emotional  Stress Factors  Family Stress Factors Lack of knowledge;Family relationships;Major life changes

## 2019-01-05 NOTE — ED Triage Notes (Signed)
Pt arrives to ED from from [place] home with complaints of altered mental status and shortness of breath starting this afternoon while staff was doing rounds (LNK at 0700 today). Pt was hypotensive (70/30) and hypoxic (80% on 2L) on EMS arrival. Per EMS pt is COVID19 positive. At baseline pt is normally alert and can talk. Pt is lethargic on arrival.

## 2019-01-05 NOTE — ED Notes (Signed)
This RN spoke with family who has chosen comfort care for the pt. MD Evangeline Gula notified.

## 2019-01-05 NOTE — Progress Notes (Signed)
Received patient from [facility] transport. Admitted and placed on telemetry. On admission patient has pressure injuries to right and left buttocks with MASD to groin and under skin folds. Applied barrier cream and foam dressing to buttocks. Antifungal powder applied to skin folds. Skin assessed with Urban Gibson, RN. Patient hypotensive on admission with plans for comfort care at this time.

## 2019-01-06 DIAGNOSIS — N179 Acute kidney failure, unspecified: Secondary | ICD-10-CM

## 2019-01-06 DIAGNOSIS — J069 Acute upper respiratory infection, unspecified: Secondary | ICD-10-CM

## 2019-01-06 DIAGNOSIS — I5042 Chronic combined systolic (congestive) and diastolic (congestive) heart failure: Secondary | ICD-10-CM

## 2019-01-06 DIAGNOSIS — L89152 Pressure ulcer of sacral region, stage 2: Secondary | ICD-10-CM

## 2019-01-06 DIAGNOSIS — G9341 Metabolic encephalopathy: Secondary | ICD-10-CM

## 2019-01-06 MED ORDER — MORPHINE 100MG IN NS 100ML (1MG/ML) PREMIX INFUSION
1.0000 mg/h | INTRAVENOUS | Status: DC
  Administered 2019-01-06 – 2019-01-08 (×2): 1 mg/h via INTRAVENOUS
  Administered 2019-01-08 – 2019-01-09 (×2): 4 mg/h via INTRAVENOUS
  Filled 2019-01-06 (×3): qty 100

## 2019-01-06 MED ORDER — ORAL CARE MOUTH RINSE: 15.0000 mL | Freq: Two times a day (BID) | OROMUCOSAL | Status: DC

## 2019-01-06 MED ORDER — MORPHINE SULFATE (PF) 2 MG/ML IV SOLN: 4.0000 mg | INTRAVENOUS | Status: DC | PRN

## 2019-01-06 NOTE — Progress Notes (Signed)
[facility] PROGRESS NOTE    Progress Note  Danielle Vargas  YHC:[id number] DOB: 08-24-1951 DOA: 12/21/2018 PCP: [name], MD     Brief Narrative:   Danielle Vargas is an 68 y.o. female past medical history significant for chronic diastolic heart failure with her last 2D echo on 03/05/2017 that showed an EF of 65% with grade 2 diastolic heart failure, with a history of Takotsubo syndrome Beatties mellitus type II morbid obesity also thrombotic stroke with residual paralysis who presents to the ED for altered mental status, [facility] where [facility] is a current outbreak of COVID-19.  When EMS got there she was satting 80% on room air placed on 100% nonrebreather and saturations improved.  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  Due to a poor prognoses and low likelihood of survival the family decided to make the patient DNR move towards comfort care.  I have talked to the daughter this morning and she is reconfirmed that her and her brother a lot to move towards comfort care. He will to stop all medications that are not keeping her comfortable, they would like to avoid all lab draws, will place on morphine  IV as needed, depending on her requirement will titrate up as needed and possibly IV morphine drip. The state relate that she would like to see her mother before her passing.   Present on admission multiple stage II-III sacral decubitus ulcer (in the ischial tuberosity on the left and on the right and on the left hip  RN Pressure Injury Documentation: Pressure Injury 02/03/18 Stage II -  Partial thickness loss of dermis presenting as a shallow open ulcer with a red, pink wound bed without slough. WOC updated Stage 2 pressure injuries; blistered serous filled with superficial skin loss on the hip (Active)  02/03/18 1841  Location: Ischial tuberosity  Location Orientation: Right  Staging: Stage II -  Partial thickness loss of dermis presenting as a shallow open ulcer with a red, pink wound bed without slough.  Wound Description (Comments): WOC updated Stage 2 pressure injuries; blistered serous filled with superficial skin loss on the hip  Present on Admission: Yes     Pressure Injury 02/03/18 Stage II -  Partial thickness loss of dermis presenting as a shallow open ulcer with a red, pink wound bed without slough. (Active)  02/03/18 2055  Location: Ischial tuberosity  Location Orientation: Left  Staging: Stage II -  Partial thickness loss of dermis presenting as a shallow open ulcer with a red, pink wound bed without slough.  Wound Description (Comments):   Present on Admission: Yes     Pressure Injury 02/04/18 Stage II -  Partial thickness loss of dermis presenting as a shallow open ulcer with a red, pink wound bed without slough. (Active)  02/04/18 1305  Location: Hip  Location Orientation: Left  Staging: Stage II -  Partial  thickness loss of dermis presenting as a shallow open ulcer with a red, pink wound bed without slough.  Wound Description (Comments):   Present on Admission: Yes     Pressure Injury 12/11/2018 Deep Tissue Injury - Purple or maroon localized area of discolored intact  skin or blood-filled blister due to damage of underlying soft tissue from pressure and/or shear. small arean on left buttocks within large area of MASD on  (Active)  12/27/2018 2055  Location: Buttocks  Location Orientation: Left  Staging: Deep Tissue Injury - Purple or maroon localized area of discolored intact skin or blood-filled blister due to damage of underlying soft tissue from pressure and/or shear.  Wound Description (Comments): small arean on left buttocks within large area of MASD on buttocks  Present on Admission:     Estimated body mass index is 37.49 kg/m as calculated from the following:   Height as of 11/16/18: 5\' 1"  (1.549 m).   Weight as of this encounter: 90 kg.   DVT prophylaxis: lovenox Family Communication:daughter Disposition Plan/Barrier to D/C: Patient will probably pass away in the hospital. Code Status:     Code Status Orders  (From admission, onward)         Start     Ordered   01/06/2019 1636  Do not attempt resuscitation (DNR)  Continuous    Question Answer Comment  In the event of cardiac or respiratory ARREST Do not call a "code blue"   In the event of cardiac or respiratory ARREST Do not perform Intubation, CPR, defibrillation or ACLS   In the event of cardiac or respiratory ARREST Use medication by any route, position, wound care, and other measures to relive pain and suffering. May use oxygen, suction and manual treatment of airway obstruction as needed for comfort.      12/17/2018 1636        Code Status History    Date Active Date Inactive Code Status Order ID Comments User Context   12/15/2018 9622 12/17/2018 1636 DNR [id number]  Lady Deutscher, MD ED   10/14/2018 1749 10/16/2018 1512 Full Code [id number]  Cristal Ford, DO ED   02/03/2018 0234 02/05/2018 2013 Full Code [id number]  Marcie Mowers, MD ED   11/28/2016 1944 11/29/2016 2045 Full Code [id number]  Jonetta Osgood, MD Inpatient   11/19/2016 0941 11/25/2016 1818 Full Code  [id number]  Sherren Mocha, MD Inpatient   11/19/2016 0941 11/19/2016 0941 Full Code [id number]  Sherren Mocha, MD Inpatient   03/14/2016 1855 04/02/2016 1417 Full Code [id number]  Cathlyn Parsons, PA-C Inpatient   03/14/2016 1855 03/14/2016 1855 Full Code [id number]  Cathlyn Parsons, PA-C Inpatient   03/10/2016 1608 03/14/2016 1855 Full Code [id number]  Rondel Jumbo, PA-C ED   11/10/2015 0714 11/10/2015 1649 Full Code [id number]  Radene Gunning, NP Inpatient   06/16/2015 1710 06/17/2015 1433 Full Code [id number]  Geradine Girt, DO Inpatient   10/26/2013 1556 10/29/2013 1634 Full Code [id number]  Rogelia Mire, NP Inpatient    Advance Directive Documentation     Most Recent Value  Type of Advance Directive  Healthcare Power of Attorney  Pre-existing out of facility DNR order (yellow form or pink MOST form)  -  "MOST" Form in Place?  -        IV Access:    Peripheral IV   Procedures and diagnostic studies:   Dg Chest Port 1 View  Result Date: 12/08/2018 CLINICAL DATA:  Short of breath.  COVID-19 positive  EXAM: PORTABLE CHEST 1 VIEW COMPARISON:  10/14/2018 FINDINGS: Cardiac enlargement. Diffuse bilateral airspace disease has developed since the prior study. This could be pneumonia or edema. No significant pleural effusion IMPRESSION: Cardiac enlargement.  Diffuse bilateral edema or pneumonia. Electronically Signed   By: Franchot Gallo M.D.   On: 01/01/2019 14:26     Medical Consultants:    None.  Anti-Infectives:   none  Subjective:    [name], relates she feels slightly better, she does relate she has an appetite.  Objective:    Vitals:   01/06/19 0200 01/06/19 0250 01/06/19 0300 01/06/19 0500  BP: 98/80   (!) 93/51  Pulse: 75 79 81 75  Resp: (!) 26 20 (!) 22   Temp:    99.8 F (37.7 C)  TempSrc:    Axillary  SpO2: 100% 93% 95% 96%  Weight:        Intake/Output Summary (Last 24 hours) at 01/06/2019 0719 Last data filed at 01/06/2019 0400 Gross per 24 hour   Intake 1353 ml  Output 400 ml  Net 953 ml   Filed Weights   12/08/2018 1349  Weight: 90 kg    Exam: General exam: Morbidly obese female in no acute distress Respiratory system: Air movement and with crackles at bases no wheezing Cardiovascular system: Rate and rhythm positive S1-S2. Gastrointestinal system: Bowel sounds soft nontender nondistended Central nervous system: She is still lethargic but able to respond to verbal stimuli. Extremities: No lower extremity edema Skin: Toes both sacral decubitus ulcers 3 on ischial tuberosity left and right and on her left hip   Data Reviewed:    Labs: Basic Metabolic Panel: Recent Labs  Lab 12/19/2018 1336 12/26/2018 1356  NA 141 140  K 3.5 3.6  CL 107  --   CO2 19*  --   GLUCOSE 271*  --   BUN 34*  --   CREATININE 2.06*  --   CALCIUM 8.6*  --    GFR Estimated Creatinine Clearance: 27.4 mL/min (A) (by C-G formula based on SCr of 2.06 mg/dL (H)). Liver Function Tests: Recent Labs  Lab 12/19/2018 1336  AST 49*  ALT 22  ALKPHOS 37*  BILITOT 1.0  PROT 6.7  ALBUMIN 2.6*   No results for input(s): LIPASE, AMYLASE in the last 168 hours. No results for input(s): AMMONIA in the last 168 hours. Coagulation profile No results for input(s): INR, PROTIME in the last 168 hours.  CBC: Recent Labs  Lab 12/08/2018 1336 12/28/2018 1356  WBC 3.7*  --   NEUTROABS 2.1  --   HGB 12.6 11.6*  HCT 41.1 34.0*  MCV 99.5  --   PLT 168  --    Cardiac Enzymes: Recent Labs  Lab 12/18/2018 1336  TROPONINI <0.03   BNP (last 3 results) No results for input(s): PROBNP in the last 8760 hours. CBG: Recent Labs  Lab 01/02/2019 1324  GLUCAP 271*   D-Dimer: Recent Labs    12/09/2018 1336  DDIMER 0.79*   Hgb A1c: No results for input(s): HGBA1C in the last 72 hours. Lipid Profile: Recent Labs    12/14/2018 1336  TRIG 232*   Thyroid function studies: No results for input(s): TSH, T4TOTAL, T3FREE, THYROIDAB in the last 72 hours.  Invalid  input(s): FREET3 Anemia work up: Recent Labs    12/09/2018 1336  FERRITIN 436*   Sepsis Labs: Recent Labs  Lab 12/16/2018 1336  PROCALCITON 0.15  WBC 3.7*  LATICACIDVEN 2.3*   Microbiology Recent Results (from the  past 240 hour(s))  Blood Culture (routine x 2)     Status: None (Preliminary result)   Collection Time: 12/09/2018  1:30 PM  Result Value Ref Range Status   Specimen Description BLOOD RIGHT WRIST  Final   Special Requests   Final    BOTTLES DRAWN AEROBIC AND ANAEROBIC Blood Culture adequate volume   Culture   Final    NO GROWTH < 24 HOURS Performed at [facility], [address], [place], [place] [address]    Report Status PENDING  Incomplete  SARS Coronavirus 2 [facility] order, Performed in [place] hospital lab)     Status: Abnormal   Collection Time: 12/16/2018  1:45 PM  Result Value Ref Range Status   SARS Coronavirus 2 POSITIVE (A) NEGATIVE Final    Comment: RESULT CALLED TO, READ BACK BY AND VERIFIED WITH: Joesph Fillers RN [id number] 12/16/2018 A BROWNING (NOTE) If result is NEGATIVE SARS-CoV-2 target nucleic acids are NOT DETECTED. The SARS-CoV-2 RNA is generally detectable in upper and lower  respiratory specimens during the acute phase of infection. The lowest  concentration of SARS-CoV-2 viral copies this assay can detect is 250  copies / mL. A negative result does not preclude SARS-CoV-2 infection  and should not be used as the sole basis for treatment or other  patient management decisions.  A negative result may occur with  improper specimen collection / handling, submission of specimen other  than nasopharyngeal swab, presence of viral mutation(s) within the  areas targeted by this assay, and inadequate number of viral copies  (<250 copies / mL). A negative result must be combined with clinical  observations, patient history, and epidemiological information. If result is POSITIVE SARS-CoV-2 target nucleic acids are DETECTED.  The SARS-CoV-2 RNA is  generally detectable in upper and lower  respiratory specimens during the acute phase of infection.  Positive  results are indicative of active infection with SARS-CoV-2.  Clinical  correlation with patient history and other diagnostic information is  necessary to determine patient infection status.  Positive results do  not rule out bacterial infection or co-infection with other viruses. If result is PRESUMPTIVE POSTIVE SARS-CoV-2 nucleic acids MAY BE PRESENT.   A presumptive positive result was obtained on the submitted specimen  and confirmed on repeat testing.  While 2019 novel coronavirus  (SARS-CoV-2) nucleic acids may be present in the submitted sample  additional confirmatory testing may be necessary for epidemiological  and / or clinical management purposes  to differentiate between  SARS-CoV-2 and other Sarbecovirus currently known to infect humans.  If clinically indicated additional testing with an alternate test  methodology [phone])  is advised. The SARS-CoV-2 RNA is generally  detectable in upper and lower respiratory specimens during the acute  phase of infection. The expected result is Negative. Fact Sheet for Patients:  StrictlyIdeas.no Fact Sheet for Healthcare Providers: BankingDealers.co.za This test is not yet approved or cleared by the [place] FDA and has been authorized for detection and/or diagnosis of SARS-CoV-2 by FDA under an Emergency Use Authorization (EUA).  This EUA will remain in effect (meaning this test can be used) for the duration of the COVID-19 declaration under Section 564(b)(1) of the Act, 21 U.S.C. section 360bbb-3(b)(1), unless the authorization is terminated or revoked sooner. Performed at [facility], [place] [address] [place],  [address]   Blood Culture (routine x 2)     Status: None (Preliminary result)   Collection Time: 12/29/2018  2:24 PM  Result  Value Ref Range Status    Specimen Description BLOOD RIGHT HAND  Final   Special Requests   Final    BOTTLES DRAWN AEROBIC AND ANAEROBIC Blood Culture results may not be optimal due to an inadequate volume of blood received in culture bottles   Culture   Final    NO GROWTH < 24 HOURS Performed at [facility] [address], [place], [place] [address]    Report Status PENDING  Incomplete     Medications:   . aspirin EC  81 mg Oral Daily  . clopidogrel  75 mg Oral Q breakfast  . docusate sodium  100 mg Oral BID  . furosemide  40 mg Oral Daily  . gabapentin  300 mg Oral BID  . mouth rinse  15 mL Mouth Rinse BID  . metoCLOPramide  10 mg Oral BID  . mometasone-formoterol  2 puff Inhalation BID  . montelukast  10 mg Oral QHS  . pantoprazole  40 mg Oral Daily  . polyethylene glycol  17 g Oral QODAY  . sodium chloride flush  3 mL Intravenous Q12H  . topiramate  50 mg Oral BID   Continuous Infusions: . sodium chloride    . chlorproMAZINE (THORAZINE) IV       LOS: 1 day   Charlynne Cousins  [facility]  01/06/2019, 7:19 AM

## 2019-01-06 NOTE — Progress Notes (Signed)
Communicated visitation policy to daughter via phone desiring to see loved one on comfort care. Verbalized understanding. Reassurance given.

## 2019-01-07 NOTE — Progress Notes (Signed)
Pt appears comfortable at this time. Will continue to monitor.

## 2019-01-07 NOTE — Progress Notes (Signed)
Pts breathing slightly labored with accessory muscle use. Morphine increased per order.

## 2019-01-07 NOTE — Progress Notes (Signed)
[facility] PROGRESS NOTE    Progress Note  Danielle Vargas  GHW:[id number] DOB: 1950-09-26 DOA: 01/02/2019 PCP: [name], MD     Brief Narrative:   Danielle Vargas is an 68 y.o. female past medical history significant for chronic diastolic heart failure with her last 2D echo on 03/05/2017 that showed an EF of 65% with grade 2 diastolic heart failure, with a history of Takotsubo syndrome Beatties mellitus type II morbid obesity also thrombotic stroke with residual paralysis who presents to the ED for altered mental status, [facility] where [facility] is a current outbreak of COVID-19.  When EMS got there she was satting 80% on room air placed on 100% nonrebreather and saturations improved.  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  I have spoken to her this morning and she is satisfied with her decision to move towards comfort care. She does relate that her mom seemed comfortable today. On my examination she has an agonal breathing, we will go ahead and increase her morphine.   Present on admission multiple stage II-III sacral decubitus ulcer (in the ischial tuberosity on the left and on the right and on the left hip  RN Pressure Injury Documentation: Pressure Injury 02/03/18 Stage II -   Partial thickness loss of dermis presenting as a shallow open ulcer with a red, pink wound bed without slough. WOC updated Stage 2 pressure injuries; blistered serous filled with superficial skin loss on the hip (Active)  02/03/18 1841  Location: Ischial tuberosity  Location Orientation: Right  Staging: Stage II -  Partial thickness loss of dermis presenting as a shallow open ulcer with a red, pink wound bed without slough.  Wound Description (Comments): WOC updated Stage 2 pressure injuries; blistered serous filled with superficial skin loss on the hip  Present on Admission: Yes     Pressure Injury 02/03/18 Stage II -  Partial thickness loss of dermis presenting as a shallow open ulcer with a red, pink wound bed without slough. (Active)  02/03/18 2055  Location: Ischial tuberosity  Location Orientation: Left  Staging: Stage II -  Partial thickness loss of dermis presenting as a shallow open ulcer with a red, pink wound bed without slough.  Wound Description (Comments):   Present on Admission: Yes     Pressure Injury 02/04/18 Stage II -  Partial thickness loss of dermis presenting as a shallow open ulcer with a red, pink wound bed without slough. (Active)  02/04/18 1305  Location: Hip  Location Orientation: Left  Staging: Stage II -  Partial thickness loss of dermis presenting as a shallow open ulcer with a red, pink wound bed without slough.  Wound Description (Comments):   Present on Admission: Yes     Pressure Injury 01/04/2019 Deep Tissue Injury - Purple or maroon localized area of discolored intact skin or blood-filled blister due to damage of underlying soft tissue from pressure and/or shear. small arean on left buttocks within large area  of MASD on  (Active)  12/30/2018 2055  Location: Buttocks  Location Orientation: Left  Staging: Deep Tissue Injury - Purple or maroon localized area of discolored intact skin or blood-filled blister due to damage of underlying soft tissue from pressure  and/or shear.  Wound Description (Comments): small arean on left buttocks within large area of MASD on buttocks  Present on Admission:     Estimated body mass index is 37.49 kg/m as calculated from the following:   Height as of 11/16/18: 5\' 1"  (1.549 m).   Weight as of this encounter: 90 kg.   DVT prophylaxis: lovenox Family Communication:daughter Disposition Plan/Barrier to D/C: Patient will probably pass away in the hospital. Code Status:     Code Status Orders  (From admission, onward)         Start     Ordered   12/12/2018 1636  Do not attempt resuscitation (DNR)  Continuous    Question Answer Comment  In the event of cardiac or respiratory ARREST Do not call a "code blue"   In the event of cardiac or respiratory ARREST Do not perform Intubation, CPR, defibrillation or ACLS   In the event of cardiac or respiratory ARREST Use medication by any route, position, wound care, and other measures to relive pain and suffering. May use oxygen, suction and manual treatment of airway obstruction as needed for comfort.      12/15/2018 1636        Code Status History    Date Active Date Inactive Code Status Order ID Comments User Context   12/17/2018 [id number] 12/22/2018 1636 DNR [id number]  Lady Deutscher, MD ED   10/14/2018 1749 10/16/2018 1512 Full Code [id number]  Cristal Ford, DO ED   02/03/2018 0234 02/05/2018 2013 Full Code [id number]  Marcie Mowers, MD ED   11/28/2016 1944 11/29/2016 2045 Full Code [id number]  Jonetta Osgood, MD Inpatient   11/19/2016 0941 11/25/2016 1818 Full Code [id number]  Sherren Mocha, MD Inpatient   11/19/2016 0941 11/19/2016 0941 Full Code [id number]  Sherren Mocha, MD Inpatient   03/14/2016 1855 04/02/2016 1417 Full Code [id number]  Cathlyn Parsons, PA-C Inpatient   03/14/2016 1855 03/14/2016 1855 Full Code [id number]  Cathlyn Parsons, PA-C Inpatient   03/10/2016 1608 03/14/2016 1855 Full Code [id number]  Rondel Jumbo, PA-C ED   11/10/2015 0714 11/10/2015  1649 Full Code [id number]  Radene Gunning, NP Inpatient   06/16/2015 1710 06/17/2015 1433 Full Code [id number]  Geradine Girt, DO Inpatient   10/26/2013 1556 10/29/2013 1634 Full Code [id number]  Rogelia Mire, NP Inpatient    Advance Directive Documentation     Most Recent Value  Type of Advance Directive  Healthcare Power of Attorney  Pre-existing out of facility DNR order (yellow form or pink MOST form)  -  "MOST" Form in Place?  -        IV Access:    Peripheral IV   Procedures and diagnostic studies:   Dg Chest Port 1 View  Result Date: 12/11/2018 CLINICAL DATA:  Short of breath.  COVID-19 positive EXAM: PORTABLE CHEST 1 VIEW COMPARISON:  10/14/2018 FINDINGS: Cardiac enlargement. Diffuse bilateral airspace disease has developed since the prior study. This could be pneumonia or edema. No significant pleural effusion IMPRESSION: Cardiac enlargement.  Diffuse bilateral edema or pneumonia. Electronically Signed   By: Franchot Gallo M.D.   On: 12/09/2018 14:26     Medical Consultants:    None.  Anti-Infectives:   none  Subjective:    Danielle Vargas, nonverbal with agonal breathing.  Objective:    Vitals:   01/07/19 0100 01/07/19 0300 01/07/19 0321 01/07/19 0654  BP:      Pulse:      Resp:      Temp:   (!) 101.9 F (38.8 C) (!) 102.2 F (39 C)  TempSrc:   Axillary Axillary  SpO2: (!) 87% (!) 81% 92%   Weight:        Intake/Output Summary (Last 24 hours) at 01/07/2019 0741 Last data filed at 01/07/2019 0700 Gross per 24 hour  Intake 133.57 ml  Output 1550 ml  Net -1416.43 ml   Filed Weights   12/18/2018 1349  Weight: 90 kg    Exam: General exam: Morbidly obese Respiratory system: Moderate air movement with crackles at bases and more pronounced on the right lung, with what it seems like agonal breathing Cardiovascular system: Tachycardic no appreciated JVD. Gastrointestinal system: Bowel sounds soft nontender Extremities: No pedal edema. Skin: No  rashes, lesions or ulcers   Data Reviewed:    Labs: Basic Metabolic Panel: Recent Labs  Lab 12/23/2018 1336 12/18/2018 1356  NA 141 140  K 3.5 3.6  CL 107  --   CO2 19*  --   GLUCOSE 271*  --   BUN 34*  --   CREATININE 2.06*  --   CALCIUM 8.6*  --    GFR Estimated Creatinine Clearance: 27.4 mL/min (A) (by C-G formula based on SCr of 2.06 mg/dL (H)). Liver Function Tests: Recent Labs  Lab 12/12/2018 1336  AST 49*  ALT 22  ALKPHOS 37*  BILITOT 1.0  PROT 6.7  ALBUMIN 2.6*   No results for input(s): LIPASE, AMYLASE in the last 168 hours. No results for input(s): AMMONIA in the last 168 hours. Coagulation profile No results for input(s): INR, PROTIME in the last 168 hours.  CBC: Recent Labs  Lab 12/18/2018 1336 12/30/2018 1356  WBC 3.7*  --   NEUTROABS 2.1  --   HGB 12.6 11.6*  HCT 41.1 34.0*  MCV 99.5  --   PLT 168  --    Cardiac Enzymes: Recent Labs  Lab 01/04/2019 1336  TROPONINI <0.03   BNP (last 3 results) No results for input(s): PROBNP in the last 8760 hours. CBG: Recent Labs  Lab 12/18/2018 1324  GLUCAP 271*   D-Dimer: Recent Labs    12/19/2018 1336  DDIMER 0.79*   Hgb A1c: No results for input(s): HGBA1C in the last 72 hours. Lipid Profile: Recent Labs    01/06/2019 1336  TRIG 232*   Thyroid function studies: No results for input(s): TSH, T4TOTAL, T3FREE, THYROIDAB in the last 72 hours.  Invalid input(s): FREET3 Anemia work up: Recent Labs    01/06/2019 1336  FERRITIN 436*   Sepsis Labs: Recent Labs  Lab 12/28/2018 1336  PROCALCITON 0.15  WBC 3.7*  LATICACIDVEN 2.3*   Microbiology Recent Results (from the past 240 hour(s))  Blood Culture (routine x 2)     Status: None (Preliminary result)   Collection Time: 12/18/2018  1:30 PM  Result Value Ref Range Status   Specimen Description BLOOD RIGHT WRIST  Final   Special Requests   Final    BOTTLES DRAWN AEROBIC AND ANAEROBIC Blood Culture adequate volume   Culture   Final    NO GROWTH <  24 HOURS Performed at [facility], [place] [address] [place], [place] [address]    Report Status PENDING  Incomplete  SARS  Coronavirus 2 [facility] order, Performed in [facility] hospital lab)     Status: Abnormal   Collection Time: 12/14/2018  1:45 PM  Result Value Ref Range Status   SARS Coronavirus 2 POSITIVE (A) NEGATIVE Final    Comment: RESULT CALLED TO, READ BACK BY AND VERIFIED WITH: Joesph Fillers RN [id number] 12/14/2018 A BROWNING (NOTE) If result is NEGATIVE SARS-CoV-2 target nucleic acids are NOT DETECTED. The SARS-CoV-2 RNA is generally detectable in upper and lower  respiratory specimens during the acute phase of infection. The lowest  concentration of SARS-CoV-2 viral copies this assay can detect is 250  copies / mL. A negative result does not preclude SARS-CoV-2 infection  and should not be used as the sole basis for treatment or other  patient management decisions.  A negative result may occur with  improper specimen collection / handling, submission of specimen other  than nasopharyngeal swab, presence of viral mutation(s) within the  areas targeted by this assay, and inadequate number of viral copies  (<250 copies / mL). A negative result must be combined with clinical  observations, patient history, and epidemiological information. If result is POSITIVE SARS-CoV-2 target nucleic acids are DETECTED.  The SARS-CoV-2 RNA is generally detectable in upper and lower  respiratory specimens during the acute phase of infection.  Positive  results are indicative of active infection with SARS-CoV-2.  Clinical  correlation with patient history and other diagnostic information is  necessary to determine patient infection status.  Positive results do  not rule out bacterial infection or co-infection with other viruses. If result is PRESUMPTIVE POSTIVE SARS-CoV-2 nucleic acids MAY BE PRESENT.   A presumptive positive result was obtained on the submitted specimen  and confirmed on  repeat testing.  While 2019 novel coronavirus  (SARS-CoV-2) nucleic acids may be present in the submitted sample  additional confirmatory testing may be necessary for epidemiological  and / or clinical management purposes  to differentiate between  SARS-CoV-2 and other Sarbecovirus currently known to infect humans.  If clinically indicated additional testing with an alternate test  methodology [phone])  is advised. The SARS-CoV-2 RNA is generally  detectable in upper and lower respiratory specimens during the acute  phase of infection. The expected result is Negative. Fact Sheet for Patients:  StrictlyIdeas.no Fact Sheet for Healthcare Providers: BankingDealers.co.za This test is not yet approved or cleared by the [place] FDA and has been authorized for detection and/or diagnosis of SARS-CoV-2 by FDA under an Emergency Use Authorization (EUA).  This EUA will remain in effect (meaning this test can be used) for the duration of the COVID-19 declaration under Section 564(b)(1) of the Act, 21 U.S.C. section 360bbb-3(b)(1), unless the authorization is terminated or revoked sooner. Performed at [facility], [place] [address], [place], [place] [address]   Blood Culture (routine x 2)     Status: None (Preliminary result)   Collection Time: 01/03/2019  2:24 PM  Result Value Ref Range Status   Specimen Description BLOOD RIGHT HAND  Final   Special Requests   Final    BOTTLES DRAWN AEROBIC AND ANAEROBIC Blood Culture results may not be optimal due to an inadequate volume of blood received in culture bottles   Culture   Final    NO GROWTH < 24 HOURS Performed at [facility], [place] [address] [place], [place] [address]    Report Status PENDING  Incomplete     Medications:   . topiramate  50 mg Oral BID  Continuous Infusions: . chlorproMAZINE (THORAZINE) IV    . morphine 1 mg/hr (01/07/19 0700)     LOS: 2 days   Charlynne Cousins  [facility]  01/07/2019, 7:41 AM

## 2019-01-07 DEATH — deceased

## 2019-01-08 MED ORDER — SODIUM CHLORIDE 0.9 % IV SOLN
INTRAVENOUS | Status: DC | PRN
  Administered 2019-01-07 – 2019-01-08 (×2): 250 mL via INTRAVENOUS

## 2019-01-08 NOTE — Plan of Care (Signed)
  Problem: Pain Managment: Goal: General experience of comfort will improve Outcome: Progressing   Problem: Safety: Goal: Ability to remain free from injury will improve Outcome: Progressing   Problem: Coping: Goal: Psychosocial and spiritual needs will be supported Outcome: Progressing   Problem: Skin Integrity: Goal: Risk for impaired skin integrity will decrease Outcome: Not Progressing   Problem: Respiratory: Goal: Will maintain a patent airway Outcome: Not Progressing Goal: Complications related to the disease process, condition or treatment will be avoided or minimized Outcome: Not Progressing

## 2019-01-08 NOTE — Progress Notes (Signed)
This RN made a phonecall to pts dtr Curt Bears to verify if she had the opportunity to see her mother and she was able to visit on Friday but states her brother did not get to visit or speak to his mother so I had him call this nurses phone & with the phone on speakerphone he was able to speak to his mother to say he loved her & missed her. I advised him we would notify he & his sister when their mother passes since she has had a steady slow decline this shift.

## 2019-01-08 NOTE — Progress Notes (Signed)
[facility] PROGRESS NOTE    Progress Note  Danielle Vargas  DXI:[id number] DOB: 1950/12/29 DOA: 12/09/2018 PCP: [name], MD     Brief Narrative:   Danielle Vargas is an 68 y.o. female past medical history significant for chronic diastolic heart failure with her last 2D echo on 03/05/2017 that showed an EF of 65% with grade 2 diastolic heart failure, with a history of Takotsubo syndrome Beatties mellitus type II morbid obesity also thrombotic stroke with residual paralysis who presents to the ED for altered mental status, [facility] where [facility] is a current outbreak of COVID-19.  When EMS got there she was satting 80% on room air placed on 100% nonrebreather and saturations improved.  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  I have talked to the daughter on the past 2 days and she has reconfirmed that her and her brother would like the patient to be comfort measures.  They would like all labs stop, and ask facility to use medication to keep her comfortable.  Currently on a morphine drip her daughter came and saw her on 01/07/2019, I did speak to her and she was comfortable  with her decision and appreciated our help. On my physical exam today the patient has agonal breathing.  Present on admission multiple stage II-III sacral decubitus ulcer (in the ischial tuberosity on the left and on the right and on the left hip  RN Pressure Injury Documentation: Pressure Injury 02/03/18 Stage II -  Partial thickness loss of dermis presenting as a shallow open ulcer with a red, pink wound bed without slough. WOC updated Stage 2 pressure injuries; blistered serous filled with superficial skin loss on the hip (Active)  02/03/18 1841  Location: Ischial tuberosity  Location Orientation: Right  Staging: Stage II -  Partial thickness loss of dermis presenting as a shallow open ulcer with a red, pink wound bed without slough.  Wound Description (Comments): WOC updated Stage 2 pressure injuries; blistered serous filled with superficial skin loss on the hip  Present on Admission: Yes     Pressure Injury 02/03/18 Stage II -  Partial thickness loss of dermis presenting as a shallow open ulcer with a red, pink wound bed without slough. (Active)  02/03/18 2055  Location: Ischial tuberosity  Location Orientation: Left  Staging: Stage II -  Partial thickness loss of dermis presenting as a shallow open ulcer with a red, pink wound bed without slough.  Wound Description (Comments):   Present on Admission: Yes     Pressure Injury 02/04/18 Stage II -  Partial thickness loss of dermis presenting as a shallow open ulcer with a red, pink wound bed without slough. (Active)  02/04/18 1305  Location: Hip  Location Orientation: Left  Staging: Stage II -  Partial thickness loss of dermis presenting as a shallow open ulcer with a red, pink  wound bed without slough.  Wound Description (Comments):   Present on Admission: Yes     Pressure Injury 12/21/2018 Deep Tissue Injury - Purple or maroon localized area of discolored intact skin or blood-filled blister due to damage of underlying soft tissue from  pressure and/or shear. small arean on left buttocks within large area of MASD on  (Active)  01/06/2019 2055  Location: Buttocks  Location Orientation: Left  Staging: Deep Tissue Injury - Purple or maroon localized area of discolored intact skin or blood-filled blister due to damage of underlying soft tissue from pressure and/or shear.  Wound Description (Comments): small arean on left buttocks within large area of MASD on buttocks  Present on Admission:     Estimated body mass index is 37.49 kg/m as calculated from the following:   Height as of 11/16/18: 5\' 1"  (1.549 m).   Weight as of this encounter: 90 kg.   DVT prophylaxis: lovenox Family Communication:daughter Disposition Plan/Barrier to D/C: Patient will probably pass away in the hospital. Code Status:     Code Status Orders  (From admission, onward)         Start     Ordered   12/23/2018 1636  Do not attempt resuscitation (DNR)  Continuous    Question Answer Comment  In the event of cardiac or respiratory ARREST Do not call a code blue   In the event of cardiac or respiratory ARREST Do not perform Intubation, CPR, defibrillation or ACLS   In the event of cardiac or respiratory ARREST Use medication by any route, position, wound care, and other measures to relive pain and suffering. May use oxygen, suction and manual treatment of airway obstruction as needed for comfort.      12/25/2018 1636        Code Status History    Date Active Date Inactive Code Status Order ID Comments User Context   12/15/2018 8182 12/15/2018 1636 DNR [id number]  Lady Deutscher, MD ED   10/14/2018 1749 10/16/2018 1512 Full Code [id number]  Cristal Ford, DO ED   02/03/2018 0234 02/05/2018 2013 Full Code [id number]  Marcie Mowers, MD ED   11/28/2016 1944 11/29/2016 2045 Full Code [id number]  Jonetta Osgood, MD Inpatient   11/19/2016 0941 11/25/2016 1818 Full Code [id number]  Sherren Mocha, MD Inpatient   11/19/2016 0941 11/19/2016 0941 Full  Code [id number]  Sherren Mocha, MD Inpatient   03/14/2016 1855 04/02/2016 1417 Full Code [id number]  Cathlyn Parsons, PA-C Inpatient   03/14/2016 1855 03/14/2016 1855 Full Code [id number]  Cathlyn Parsons, PA-C Inpatient   03/10/2016 1608 03/14/2016 1855 Full Code [id number]  Rondel Jumbo, PA-C ED   11/10/2015 0714 11/10/2015 1649 Full Code [id number]  Radene Gunning, NP Inpatient   06/16/2015 1710 06/17/2015 1433 Full Code [id number]  Geradine Girt, DO Inpatient   10/26/2013 1556 10/29/2013 1634 Full Code [id number]  Rogelia Mire, NP Inpatient    Advance Directive Documentation     Most Recent Value  Type of Advance Directive  Healthcare Power of Attorney  Pre-existing out of facility DNR order (yellow form or pink MOST form)  --  "MOST" Form in Place?  --        IV Access:    Peripheral IV   Procedures and diagnostic studies:   No results found.   Medical Consultants:    None.  Anti-Infectives:   none  Subjective:    Danielle Vargas, in no acute distress  Objective:  Vitals:   01/08/19 0500 01/08/19 0600 01/08/19 0700 01/08/19 0800  BP:      Pulse: 99 100 100 100  Resp: (!) 8     Temp:      TempSrc:      SpO2: (!) 74% (!) 74% (!) 74% (!) 71%  Weight:        Intake/Output Summary (Last 24 hours) at 01/08/2019 1013 Last data filed at 01/08/2019 0852 Gross per 24 hour  Intake 226.98 ml  Output 475 ml  Net -248.02 ml   Filed Weights   12/11/2018 1349  Weight: 90 kg    Exam: General exam: Morbidly obese Respiratory system: Moderate air movement with crackles at bases and more pronounced on the right lung, with what it seems like agonal breathing Cardiovascular system: Tachycardic no appreciated JVD. Gastrointestinal system: Bowel sounds soft nontender Extremities: No pedal edema. Skin: No rashes, lesions or ulcers   Data Reviewed:    Labs: Basic Metabolic Panel: Recent Labs  Lab 12/15/2018 1336 01/01/2019 1356  NA 141 140  K 3.5 3.6  CL 107  --    CO2 19*  --   GLUCOSE 271*  --   BUN 34*  --   CREATININE 2.06*  --   CALCIUM 8.6*  --    GFR Estimated Creatinine Clearance: 27.4 mL/min (A) (by C-G formula based on SCr of 2.06 mg/dL (H)). Liver Function Tests: Recent Labs  Lab 12/08/2018 1336  AST 49*  ALT 22  ALKPHOS 37*  BILITOT 1.0  PROT 6.7  ALBUMIN 2.6*   No results for input(s): LIPASE, AMYLASE in the last 168 hours. No results for input(s): AMMONIA in the last 168 hours. Coagulation profile No results for input(s): INR, PROTIME in the last 168 hours.  CBC: Recent Labs  Lab 12/18/2018 1336 12/21/2018 1356  WBC 3.7*  --   NEUTROABS 2.1  --   HGB 12.6 11.6*  HCT 41.1 34.0*  MCV 99.5  --   PLT 168  --    Cardiac Enzymes: Recent Labs  Lab 12/23/2018 1336  TROPONINI <0.03   BNP (last 3 results) No results for input(s): PROBNP in the last 8760 hours. CBG: Recent Labs  Lab 12/13/2018 1324  GLUCAP 271*   D-Dimer: Recent Labs    12/26/2018 1336  DDIMER 0.79*   Hgb A1c: No results for input(s): HGBA1C in the last 72 hours. Lipid Profile: Recent Labs    12/28/2018 1336  TRIG 232*   Thyroid function studies: No results for input(s): TSH, T4TOTAL, T3FREE, THYROIDAB in the last 72 hours.  Invalid input(s): FREET3 Anemia work up: Recent Labs    01/01/2019 1336  FERRITIN 436*   Sepsis Labs: Recent Labs  Lab 12/19/2018 1336  PROCALCITON 0.15  WBC 3.7*  LATICACIDVEN 2.3*   Microbiology Recent Results (from the past 240 hour(s))  Blood Culture (routine x 2)     Status: None (Preliminary result)   Collection Time: 12/24/2018  1:30 PM  Result Value Ref Range Status   Specimen Description BLOOD RIGHT WRIST  Final   Special Requests   Final    BOTTLES DRAWN AEROBIC AND ANAEROBIC Blood Culture adequate volume   Culture   Final    NO GROWTH 3 DAYS Performed at [facility], [address], [place], [place] [address]    Report Status PENDING  Incomplete  SARS Coronavirus 2 [facility] order, Performed  in [place] hospital lab)     Status: Abnormal   Collection Time: 12/20/2018  1:45 PM  Result Value Ref Range Status   SARS Coronavirus 2 POSITIVE (A) NEGATIVE Final    Comment: RESULT CALLED TO, READ BACK BY AND VERIFIED WITH: Joesph Fillers RN [id number] 12/10/2018 A BROWNING (NOTE) If result is NEGATIVE SARS-CoV-2 target nucleic acids are NOT DETECTED. The SARS-CoV-2 RNA is generally detectable in upper and lower  respiratory specimens during the acute phase of infection. The lowest  concentration of SARS-CoV-2 viral copies this assay can detect is 250  copies / mL. A negative result does not preclude SARS-CoV-2 infection  and should not be used as the sole basis for treatment or other  patient management decisions.  A negative result may occur with  improper specimen collection / handling, submission of specimen other  than nasopharyngeal swab, presence of viral mutation(s) within the  areas targeted by this assay, and inadequate number of viral copies  (<250 copies / mL). A negative result must be combined with clinical  observations, patient history, and epidemiological information. If result is POSITIVE SARS-CoV-2 target nucleic acids are DETECTED.  The SARS-CoV-2 RNA is generally detectable in upper and lower  respiratory specimens during the acute phase of infection.  Positive  results are indicative of active infection with SARS-CoV-2.  Clinical  correlation with patient history and other diagnostic information is  necessary to determine patient infection status.  Positive results do  not rule out bacterial infection or co-infection with other viruses. If result is PRESUMPTIVE POSTIVE SARS-CoV-2 nucleic acids MAY BE PRESENT.   A presumptive positive result was obtained on the submitted specimen  and confirmed on repeat testing.  While 2019 novel coronavirus  (SARS-CoV-2) nucleic acids may be present in the submitted sample  additional confirmatory testing may be necessary for  epidemiological  and / or clinical management purposes  to differentiate between  SARS-CoV-2 and other Sarbecovirus currently known to infect humans.  If clinically indicated additional testing with an alternate test  methodology [phone])  is advised. The SARS-CoV-2 RNA is generally  detectable in upper and lower respiratory specimens during the acute  phase of infection. The expected result is Negative. Fact Sheet for Patients:  StrictlyIdeas.no Fact Sheet for Healthcare Providers: BankingDealers.co.za This test is not yet approved or cleared by the [place] FDA and has been authorized for detection and/or diagnosis of SARS-CoV-2 by FDA under an Emergency Use Authorization (EUA).  This EUA will remain in effect (meaning this test can be used) for the duration of the COVID-19 declaration under Section 564(b)(1) of the Act, 21 U.S.C. section 360bbb-3(b)(1), unless the authorization is terminated or revoked sooner. Performed at [facility], [place] [address] [place], [place] [address]   Blood Culture (routine x 2)     Status: None (Preliminary result)   Collection Time: 12/14/2018  2:24 PM  Result Value Ref Range Status   Specimen Description BLOOD RIGHT HAND  Final   Special Requests   Final    BOTTLES DRAWN AEROBIC AND ANAEROBIC Blood Culture results may not be optimal due to an inadequate volume of blood received in culture bottles   Culture   Final    NO GROWTH 3 DAYS Performed at [facility], [place] [address] [place], [place] [address]    Report Status PENDING  Incomplete     Medications:    topiramate  50 mg Oral BID   Continuous Infusions:  sodium chloride 10 mL/hr at 01/08/19 0852   chlorproMAZINE (THORAZINE) IV     morphine 3 mg/hr (  01/08/19 0852)     LOS: 3 days   Charlynne Cousins  [facility]  01/08/2019, 10:13 AM

## 2019-01-08 NOTE — Progress Notes (Signed)
Pt sleeping, opens eyes to pain or repositioning, O2sats 81-83% on NRB, Morphine drip remains at 3mg hr

## 2019-01-08 NOTE — Progress Notes (Signed)
Patient appears air hungry and gasping for air and restless after bed change. Morphine drip increased to 4mg /hr. PRN ativan given. Will continue to monitor patient.

## 2019-01-09 MED ORDER — ARTIFICIAL TEARS OPHTHALMIC OINT: TOPICAL_OINTMENT | Freq: Two times a day (BID) | OPHTHALMIC | Status: DC

## 2019-01-10 LAB — CULTURE, BLOOD (ROUTINE X 2)
Culture: NO GROWTH
Culture: NO GROWTH
Special Requests: ADEQUATE

## 2019-02-07 NOTE — Progress Notes (Signed)
MD notified; patient passed at 71. Lack of heartbeat and respirations. Verified by second RN. IV infusion shut off.

## 2019-02-07 NOTE — Progress Notes (Signed)
MSO4 drip removed from room and wasted with Shanon Ace, RN. 60ml wasted via stericycle.

## 2019-02-07 NOTE — Death Summary Note (Signed)
[facility] Death Note                                                                                                                                                                                               Danielle Vargas, is a 68 y.o. female, DOB - [birth year], TDD:[id number]  Admit date - 12/20/2018   Admitting Physician Lady Deutscher, MD  Outpatient Primary MD for the patient is [name], MD  LOS - 4  Chief Complaint  Patient presents with   Shortness of Breath   Altered Mental Status       Notification: [name], MD notified of death of Jan 20, 2019   Date and Time of Death - Jan 20, 2019 at 15.53  Pronounced by - RN  History of present illness:   Danielle Vargas is a 68 y.o. female with a history of - Danielle Vargas is an 68 y.o. female past medical history significant for chronic diastolic heart failure with her last 2D echo on 03/05/2017 that showed an EF of 65% with grade 2 diastolic heart failure, with a history of Takotsubo syndrome, DM type II morbid obesity also thrombotic stroke with residual paralysis who presents to the ED for altered mental status, [facility] where there is a current outbreak of COVID-19.  When EMS got there she was satting 80% on room air placed on 100% nonrebreather and saturations improved. She was diagnosed with Covid 19 and soon transitioned to comfort care due to poor clinical state.   Final Diagnoses:  Cause if death - Covid 13 Infection  Signature  Lala Lund M.D on 01-20-2019 at 6:07 PM  [facility]  Office Phone [phone]  Total clinical and documentation time for today Under 30 minutes   Last Note    [facility] PROGRESS NOTE    Progress Note  Danielle Vargas  VVO:[id number] DOB: [birth year] DOA: 12/26/2018 PCP: [name], MD     Brief Narrative:   Danielle Vargas  is an 68 y.o. female past medical history significant for chronic diastolic heart failure with her last 2D echo on 03/05/2017 that showed an EF of 65% with grade 2 diastolic heart failure, with a history of Takotsubo syndrome, DM type II morbid obesity also thrombotic stroke with residual paralysis who presents to the ED for altered mental status, [facility] where there is a current outbreak of COVID-19.  When EMS got there she was satting 80% on room air placed on 100% nonrebreather and saturations improved. She was diagnosed with Covid 19 and soon transitioned to comfort care due to poor clinical  state.  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  She is now obtunded, appears to be close to death.  Family informed by me personally on 02-02-2019.  They are reasonable and expect her death as well.  She is DNR.  Goal of care is now comfort only.   Present on admission multiple stage II-III sacral decubitus ulcer (in the ischial tuberosity on the left and on the right and on the left hip  RN Pressure Injury Documentation: Pressure Injury 02/03/18 Stage II -  Partial thickness loss of dermis presenting as  a shallow open ulcer with a red, pink wound bed without slough. WOC updated Stage 2 pressure injuries; blistered serous filled with superficial skin loss on the hip (Active)  02/03/18 1841  Location: Ischial tuberosity  Location Orientation: Right  Staging: Stage II -  Partial thickness loss of dermis presenting as a shallow open ulcer with a red, pink wound bed without slough.  Wound Description (Comments): WOC updated Stage 2 pressure injuries; blistered serous filled with superficial skin loss on the hip  Present on Admission: Yes     Pressure Injury 02/03/18 Stage II -  Partial thickness loss of dermis presenting as a shallow open ulcer with a red, pink wound bed without slough. (Active)  02/03/18 2055  Location: Ischial tuberosity  Location Orientation: Left  Staging: Stage II -  Partial thickness loss of dermis presenting as a shallow open ulcer with a red, pink wound bed without slough.  Wound Description (Comments):   Present on Admission: Yes     Pressure Injury 02/04/18 Stage II -  Partial thickness loss of dermis presenting as a shallow open ulcer with a red, pink wound bed without slough. (Active)  02/04/18 1305  Location: Hip  Location Orientation: Left  Staging: Stage II -  Partial thickness loss of dermis presenting as a shallow open ulcer with a red, pink wound bed without slough.  Wound Description (Comments):   Present on Admission: Yes     Pressure Injury 12/26/2018 Deep Tissue Injury - Purple or maroon localized area of discolored intact skin or blood-filled blister due to damage of underlying soft tissue from pressure and/or shear. small arean on left buttocks within large area of MASD on  (Active)  12/09/2018 2055  Location: Buttocks  Location Orientation: Left  Staging: Deep Tissue Injury - Purple or maroon localized area of discolored intact skin or blood-filled blister due to damage of underlying soft tissue from pressure and/or shear.  Wound Description (Comments):  small arean on left buttocks within large area of MASD on buttocks  Present on Admission:     Estimated body mass index is 37.49 kg/m as calculated from the following:   Height as of 11/16/18: 5\' 1"  (1.549 m).   Weight as of this encounter: 90 kg.   DVT prophylaxis: lovenox Family Communication:daughter updated Feb 02, 2019 Disposition Plan/Barrier  to D/C: Patient will probably pass away in the hospital today. Code Status:  DNR   IV Access:    Peripheral IV   Procedures and diagnostic studies:   No results found.   Medical Consultants:    None.  Anti-Infectives:   none  Subjective:    [name], in no acute distress  Objective:    Vitals:   01/31/2019 1200 2019-01-31 1300 Jan 31, 2019 1334 01-31-2019 1400  BP:   (!) 101/54   Pulse: 97 95  94  Resp:   (!) 24   Temp:   98.7 F (37.1 C)   TempSrc:   Oral   SpO2: (!) 76% (!) 87%  (!) 88%  Weight:        Intake/Output Summary (Last 24 hours) at 31-Jan-2019 1808 Last data filed at 31-Jan-2019 1400 Gross per 24 hour  Intake 353.75 ml  Output 250 ml  Net 103.75 ml   Filed Weights   12/30/2018 1349  Weight: 90 kg    Exam:  General exam: Morbidly obese, obtunded Respiratory system: Moderate air movement with crackles at bases and more pronounced on the right lung, with what it seems like agonal breathing Cardiovascular system: Tachycardic no appreciated JVD. Gastrointestinal system: Bowel sounds soft nontender Extremities: No pedal edema. Skin: No rashes, lesions or ulcers   Data Reviewed:    Labs: Basic Metabolic Panel: Recent Labs  Lab 12/08/2018 1336 12/27/2018 1356  NA 141 140  K 3.5 3.6  CL 107  --   CO2 19*  --   GLUCOSE 271*  --   BUN 34*  --   CREATININE 2.06*  --   CALCIUM 8.6*  --    GFR Estimated Creatinine Clearance: 27.4 mL/min (A) (by C-G formula based on SCr of 2.06 mg/dL (H)). Liver Function Tests: Recent Labs  Lab 12/27/2018 1336  AST 49*  ALT 22  ALKPHOS 37*  BILITOT 1.0  PROT  6.7  ALBUMIN 2.6*   No results for input(s): LIPASE, AMYLASE in the last 168 hours. No results for input(s): AMMONIA in the last 168 hours. Coagulation profile No results for input(s): INR, PROTIME in the last 168 hours.  CBC: Recent Labs  Lab 12/27/2018 1336 12/29/2018 1356  WBC 3.7*  --   NEUTROABS 2.1  --   HGB 12.6 11.6*  HCT 41.1 34.0*  MCV 99.5  --   PLT 168  --    Cardiac Enzymes: Recent Labs  Lab 12/24/2018 1336  TROPONINI <0.03   BNP (last 3 results) No results for input(s): PROBNP in the last 8760 hours. CBG: Recent Labs  Lab 12/23/2018 1324  GLUCAP 271*   D-Dimer: No results for input(s): DDIMER in the last 72 hours. Hgb A1c: No results for input(s): HGBA1C in the last 72 hours. Lipid Profile: No results for input(s): CHOL, HDL, LDLCALC, TRIG, CHOLHDL, LDLDIRECT in the last 72 hours. Thyroid function studies: No results for input(s): TSH, T4TOTAL, T3FREE, THYROIDAB in the last 72 hours.  Invalid input(s): FREET3 Anemia work up: No results for input(s): VITAMINB12, FOLATE, FERRITIN, TIBC, IRON, RETICCTPCT in the last 72 hours. Sepsis Labs: Recent Labs  Lab 12/11/2018 1336  PROCALCITON 0.15  WBC 3.7*  LATICACIDVEN 2.3*   Microbiology Recent Results (from the past 240 hour(s))  Blood Culture (routine x 2)     Status: None (Preliminary result)   Collection Time: 12/15/2018  1:30 PM  Result Value Ref Range Status   Specimen Description BLOOD RIGHT WRIST  Final   Special Requests  Final    BOTTLES DRAWN AEROBIC AND ANAEROBIC Blood Culture adequate volume   Culture   Final    NO GROWTH 4 DAYS Performed at [facility], [place] [address], [place], [place] [address]    Report Status PENDING  Incomplete  SARS Coronavirus 2 [facility] order, Performed in [place] hospital lab)     Status: Abnormal   Collection Time: 12/16/2018  1:45 PM  Result Value Ref Range Status   SARS Coronavirus 2 POSITIVE (A) NEGATIVE Final    Comment: RESULT CALLED TO, READ BACK  BY AND VERIFIED WITH: Joesph Fillers RN [id number] 12/09/2018 A BROWNING (NOTE) If result is NEGATIVE SARS-CoV-2 target nucleic acids are NOT DETECTED. The SARS-CoV-2 RNA is generally detectable in upper and lower  respiratory specimens during the acute phase of infection. The lowest  concentration of SARS-CoV-2 viral copies this assay can detect is 250  copies / mL. A negative result does not preclude SARS-CoV-2 infection  and should not be used as the sole basis for treatment or other  patient management decisions.  A negative result may occur with  improper specimen collection / handling, submission of specimen other  than nasopharyngeal swab, presence of viral mutation(s) within the  areas targeted by this assay, and inadequate number of viral copies  (<250 copies / mL). A negative result must be combined with clinical  observations, patient history, and epidemiological information. If result is POSITIVE SARS-CoV-2 target nucleic acids are DETECTED.  The SARS-CoV-2 RNA is generally detectable in upper and lower  respiratory specimens during the acute phase of infection.  Positive  results are indicative of active infection with SARS-CoV-2.  Clinical  correlation with patient history and other diagnostic information is  necessary to determine patient infection status.  Positive results do  not rule out bacterial infection or co-infection with other viruses. If result is PRESUMPTIVE POSTIVE SARS-CoV-2 nucleic acids MAY BE PRESENT.   A presumptive positive result was obtained on the submitted specimen  and confirmed on repeat testing.  While 2019 novel coronavirus  (SARS-CoV-2) nucleic acids may be present in the submitted sample  additional confirmatory testing may be necessary for epidemiological  and / or clinical management purposes  to differentiate between  SARS-CoV-2 and other Sarbecovirus currently known to infect humans.  If clinically indicated additional testing with an alternate test   methodology [phone])  is advised. The SARS-CoV-2 RNA is generally  detectable in upper and lower respiratory specimens during the acute  phase of infection. The expected result is Negative. Fact Sheet for Patients:  StrictlyIdeas.no Fact Sheet for Healthcare Providers: BankingDealers.co.za This test is not yet approved or cleared by the [place] FDA and has been authorized for detection and/or diagnosis of SARS-CoV-2 by FDA under an Emergency Use Authorization (EUA).  This EUA will remain in effect (meaning this test can be used) for the duration of the COVID-19 declaration under Section 564(b)(1) of the Act, 21 U.S.C. section 360bbb-3(b)(1), unless the authorization is terminated or revoked sooner. Performed at [facility], [place] [address] [place], [place] [address]   Blood Culture (routine x 2)     Status: None (Preliminary result)   Collection Time: 12/29/2018  2:24 PM  Result Value Ref Range Status   Specimen Description BLOOD RIGHT HAND  Final   Special Requests   Final    BOTTLES DRAWN AEROBIC AND ANAEROBIC Blood Culture results may not be optimal due to an inadequate volume of blood received in culture bottles  Culture   Final    NO GROWTH 4 DAYS Performed at [facility], [place] [address] [place],  [address]    Report Status PENDING  Incomplete     Medications:    artificial tears   Both Eyes BID   topiramate  50 mg Oral BID   Continuous Infusions:  sodium chloride 10 mL/hr at 2019-01-21 1400   chlorproMAZINE (THORAZINE) IV     morphine 5 mg/hr (21-Jan-2019 1646)     LOS: 4 days   [facility]  21-Jan-2019, 6:08 PM

## 2019-02-07 NOTE — Progress Notes (Signed)
MD returned page; questions regarding comfort care status. Progress notes indicate that patient is comfort care status but no order is in place. MD acknowledges that patient is comfort care and states to call back if any specific care/medication requests are needed.

## 2019-02-07 NOTE — Progress Notes (Signed)
Called and spoke with daughter, Curt Bears, to update on patient's current status. Daughter verbalizes understanding of patient status and verbalizes appreciation for the update.

## 2019-02-07 NOTE — Plan of Care (Signed)
Patient lying in bed this morning; IV Morphine drip @ 4mg /hr. Appears comfortable currently. Respirations slow and labored; sats low 70s. Will continue to monitor.

## 2019-02-07 NOTE — Progress Notes (Signed)
[facility] PROGRESS NOTE    Progress Note  Danielle Vargas  WVP:[id number] DOB: 1950-12-23 DOA: 12/30/2018 PCP: [name], MD     Brief Narrative:   Danielle Vargas is an 68 y.o. female past medical history significant for chronic diastolic heart failure with her last 2D echo on 03/05/2017 that showed an EF of 65% with grade 2 diastolic heart failure, with a history of Takotsubo syndrome Beatties mellitus type II morbid obesity also thrombotic stroke with residual paralysis who presents to the ED for altered mental status, [facility] where there is a current outbreak of COVID-19.  When EMS got there she was satting 80% on room air placed on 100% nonrebreather and saturations improved.  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  She is now obtunded, appears to be close to death.  Family informed by me personally on 2019/01/22.  They are reasonable and expect her death as well.  She is DNR.  Goal of care is now comfort only.   Present on admission multiple stage II-III sacral decubitus ulcer (in the ischial tuberosity on the left and on the right and on the left hip  RN Pressure  Injury Documentation: Pressure Injury 02/03/18 Stage II -  Partial thickness loss of dermis presenting as a shallow open ulcer with a red, pink wound bed without slough. WOC updated Stage 2 pressure injuries; blistered serous filled with superficial skin loss on the hip (Active)  02/03/18 1841  Location: Ischial tuberosity  Location Orientation: Right  Staging: Stage II -  Partial thickness loss of dermis presenting as a shallow open ulcer with a red, pink wound bed without slough.  Wound Description (Comments): WOC updated Stage 2 pressure injuries; blistered serous filled with superficial skin loss on the hip  Present on Admission: Yes     Pressure Injury 02/03/18 Stage II -  Partial thickness loss of dermis presenting as a shallow open ulcer with a red, pink wound bed without slough. (Active)  02/03/18 2055  Location: Ischial tuberosity  Location Orientation: Left  Staging: Stage II -  Partial thickness loss of dermis presenting as a shallow open ulcer with a red, pink wound bed without slough.  Wound Description (Comments):   Present on Admission: Yes     Pressure Injury 02/04/18 Stage II -  Partial thickness loss of dermis presenting as a shallow open ulcer with a red, pink wound bed without slough. (Active)  02/04/18 1305  Location: Hip  Location Orientation: Left  Staging: Stage II -  Partial thickness loss of dermis presenting as a shallow open ulcer with a red, pink wound bed without slough.  Wound Description (Comments):   Present on Admission: Yes     Pressure Injury 01/04/2019 Deep Tissue Injury - Purple or maroon localized area of discolored intact skin or blood-filled blister due to damage of underlying soft  tissue from pressure and/or shear. small arean on left buttocks within large area of MASD on  (Active)  01/02/2019 2055  Location: Buttocks  Location Orientation: Left  Staging: Deep Tissue Injury - Purple or maroon localized area of discolored intact skin or blood-filled  blister due to damage of underlying soft tissue from pressure and/or shear.  Wound Description (Comments): small arean on left buttocks within large area of MASD on buttocks  Present on Admission:     Estimated body mass index is 37.49 kg/m as calculated from the following:   Height as of 11/16/18: 5\' 1"  (1.549 m).   Weight as of this encounter: 90 kg.   DVT prophylaxis: lovenox Family Communication:daughter updated 02-06-2019 Disposition Plan/Barrier to D/C: Patient will probably pass away in the hospital today. Code Status:  DNR   IV Access:    Peripheral IV   Procedures and diagnostic studies:   No results found.   Medical Consultants:    None.  Anti-Infectives:   none  Subjective:    Danielle Vargas, in no acute distress  Objective:    Vitals:   01/08/19 1350 01/08/19 1956 02-06-19 0425 02-06-2019 0600  BP: (!) 121/59 (!) 112/59 (!) 111/55   Pulse: (!) 105 (!) 112 (!) 105   Resp: 14 12 12    Temp: 100.1 F (37.8 C) 99.1 F (37.3 C) (!) 103.1 F (39.5 C) (!) 100.5 F (38.1 C)  TempSrc: Axillary Axillary Axillary Axillary  SpO2: (!) 60% (!) 58% (!) 66%   Weight:        Intake/Output Summary (Last 24 hours) at Feb 06, 2019 1105 Last data filed at Feb 06, 2019 0600 Gross per 24 hour  Intake 40.73 ml  Output 500 ml  Net -459.27 ml   Filed Weights   12/19/2018 1349  Weight: 90 kg    Exam:  General exam: Morbidly obese, obtunded Respiratory system: Moderate air movement with crackles at bases and more pronounced on the right lung, with what it seems like agonal breathing Cardiovascular system: Tachycardic no appreciated JVD. Gastrointestinal system: Bowel sounds soft nontender Extremities: No pedal edema. Skin: No rashes, lesions or ulcers   Data Reviewed:    Labs: Basic Metabolic Panel: Recent Labs  Lab 12/11/2018 1336 12/20/2018 1356  NA 141 140  K 3.5 3.6  CL 107  --   CO2 19*  --   GLUCOSE 271*  --   BUN 34*  --   CREATININE 2.06*  --     CALCIUM 8.6*  --    GFR Estimated Creatinine Clearance: 27.4 mL/min (A) (by C-G formula based on SCr of 2.06 mg/dL (H)). Liver Function Tests: Recent Labs  Lab 01/06/2019 1336  AST 49*  ALT 22  ALKPHOS 37*  BILITOT 1.0  PROT 6.7  ALBUMIN 2.6*   No results for input(s): LIPASE, AMYLASE in the last 168 hours. No results for input(s): AMMONIA in the last 168 hours. Coagulation profile No results for input(s): INR, PROTIME in the last 168 hours.  CBC: Recent Labs  Lab 12/10/2018 1336 12/20/2018 1356  WBC 3.7*  --   NEUTROABS 2.1  --   HGB 12.6 11.6*  HCT 41.1 34.0*  MCV 99.5  --   PLT 168  --    Cardiac Enzymes: Recent Labs  Lab 12/15/2018 1336  TROPONINI <0.03   BNP (last 3 results) No results for input(s): PROBNP in the last 8760 hours. CBG: Recent Labs  Lab 12/25/2018 1324  GLUCAP 271*   D-Dimer: No results for input(s): DDIMER  in the last 72 hours. Hgb A1c: No results for input(s): HGBA1C in the last 72 hours. Lipid Profile: No results for input(s): CHOL, HDL, LDLCALC, TRIG, CHOLHDL, LDLDIRECT in the last 72 hours. Thyroid function studies: No results for input(s): TSH, T4TOTAL, T3FREE, THYROIDAB in the last 72 hours.  Invalid input(s): FREET3 Anemia work up: No results for input(s): VITAMINB12, FOLATE, FERRITIN, TIBC, IRON, RETICCTPCT in the last 72 hours. Sepsis Labs: Recent Labs  Lab 12/29/2018 1336  PROCALCITON 0.15  WBC 3.7*  LATICACIDVEN 2.3*   Microbiology Recent Results (from the past 240 hour(s))  Blood Culture (routine x 2)     Status: None (Preliminary result)   Collection Time: 12/12/2018  1:30 PM  Result Value Ref Range Status   Specimen Description BLOOD RIGHT WRIST  Final   Special Requests   Final    BOTTLES DRAWN AEROBIC AND ANAEROBIC Blood Culture adequate volume   Culture   Final    NO GROWTH 4 DAYS Performed at [facility], [address], [place], [place] [address]    Report Status PENDING  Incomplete  SARS Coronavirus 2  [facility] order, Performed in [place] hospital lab)     Status: Abnormal   Collection Time: 12/10/2018  1:45 PM  Result Value Ref Range Status   SARS Coronavirus 2 POSITIVE (A) NEGATIVE Final    Comment: RESULT CALLED TO, READ BACK BY AND VERIFIED WITH: Joesph Fillers RN [id number] 12/17/2018 A BROWNING (NOTE) If result is NEGATIVE SARS-CoV-2 target nucleic acids are NOT DETECTED. The SARS-CoV-2 RNA is generally detectable in upper and lower  respiratory specimens during the acute phase of infection. The lowest  concentration of SARS-CoV-2 viral copies this assay can detect is 250  copies / mL. A negative result does not preclude SARS-CoV-2 infection  and should not be used as the sole basis for treatment or other  patient management decisions.  A negative result may occur with  improper specimen collection / handling, submission of specimen other  than nasopharyngeal swab, presence of viral mutation(s) within the  areas targeted by this assay, and inadequate number of viral copies  (<250 copies / mL). A negative result must be combined with clinical  observations, patient history, and epidemiological information. If result is POSITIVE SARS-CoV-2 target nucleic acids are DETECTED.  The SARS-CoV-2 RNA is generally detectable in upper and lower  respiratory specimens during the acute phase of infection.  Positive  results are indicative of active infection with SARS-CoV-2.  Clinical  correlation with patient history and other diagnostic information is  necessary to determine patient infection status.  Positive results do  not rule out bacterial infection or co-infection with other viruses. If result is PRESUMPTIVE POSTIVE SARS-CoV-2 nucleic acids MAY BE PRESENT.   A presumptive positive result was obtained on the submitted specimen  and confirmed on repeat testing.  While 2019 novel coronavirus  (SARS-CoV-2) nucleic acids may be present in the submitted sample  additional confirmatory testing  may be necessary for epidemiological  and / or clinical management purposes  to differentiate between  SARS-CoV-2 and other Sarbecovirus currently known to infect humans.  If clinically indicated additional testing with an alternate test  methodology [phone])  is advised. The SARS-CoV-2 RNA is generally  detectable in upper and lower respiratory specimens during the acute  phase of infection. The expected result is Negative. Fact Sheet for Patients:  StrictlyIdeas.no Fact Sheet for Healthcare Providers: BankingDealers.co.za This test is not yet approved or cleared by the [place]  States FDA and has been authorized for detection and/or diagnosis of SARS-CoV-2 by FDA under an Emergency Use Authorization (EUA).  This EUA will remain in effect (meaning this test can be used) for the duration of the COVID-19 declaration under Section 564(b)(1) of the Act, 21 U.S.C. section 360bbb-3(b)(1), unless the authorization is terminated or revoked sooner. Performed at [facility], [place] [address], [place], [place] [address]   Blood Culture (routine x 2)     Status: None (Preliminary result)   Collection Time: 01/06/2019  2:24 PM  Result Value Ref Range Status   Specimen Description BLOOD RIGHT HAND  Final   Special Requests   Final    BOTTLES DRAWN AEROBIC AND ANAEROBIC Blood Culture results may not be optimal due to an inadequate volume of blood received in culture bottles   Culture   Final    NO GROWTH 4 DAYS Performed at [facility], [place] [address], [place], [place] [address]    Report Status PENDING  Incomplete     Medications:    topiramate  50 mg Oral BID   Continuous Infusions:  sodium chloride 10 mL/hr at 01/08/19 1200   chlorproMAZINE (THORAZINE) IV     morphine 4 mg/hr (01/08/19 2158)     LOS: 4 days   [facility]  Feb 01, 2019, 11:05 AM

## 2019-02-07 NOTE — Progress Notes (Signed)
MD returned page; order received to allow RN to pronounce.

## 2019-02-07 DEATH — deceased
